# Patient Record
Sex: Female | Born: 1955 | Race: White | Hispanic: No | Smoking: Never smoker
Health system: Southern US, Community
[De-identification: ages and names within clinical notes are randomized; demographics above are authoritative.]

## PROBLEM LIST (undated history)

## (undated) DIAGNOSIS — M1990 Unspecified osteoarthritis, unspecified site: Secondary | ICD-10-CM

## (undated) DIAGNOSIS — Z8489 Family history of other specified conditions: Secondary | ICD-10-CM

## (undated) DIAGNOSIS — K3184 Gastroparesis: Secondary | ICD-10-CM

## (undated) DIAGNOSIS — E669 Obesity, unspecified: Secondary | ICD-10-CM

## (undated) DIAGNOSIS — K219 Gastro-esophageal reflux disease without esophagitis: Secondary | ICD-10-CM

## (undated) DIAGNOSIS — A159 Respiratory tuberculosis unspecified: Secondary | ICD-10-CM

## (undated) DIAGNOSIS — F32A Depression, unspecified: Secondary | ICD-10-CM

## (undated) DIAGNOSIS — K2970 Gastritis, unspecified, without bleeding: Secondary | ICD-10-CM

## (undated) DIAGNOSIS — I10 Essential (primary) hypertension: Secondary | ICD-10-CM

## (undated) DIAGNOSIS — K7460 Unspecified cirrhosis of liver: Secondary | ICD-10-CM

## (undated) DIAGNOSIS — D649 Anemia, unspecified: Secondary | ICD-10-CM

## (undated) DIAGNOSIS — I639 Cerebral infarction, unspecified: Secondary | ICD-10-CM

## (undated) DIAGNOSIS — K222 Esophageal obstruction: Secondary | ICD-10-CM

## (undated) DIAGNOSIS — F329 Major depressive disorder, single episode, unspecified: Secondary | ICD-10-CM

## (undated) DIAGNOSIS — G629 Polyneuropathy, unspecified: Secondary | ICD-10-CM

## (undated) DIAGNOSIS — F419 Anxiety disorder, unspecified: Secondary | ICD-10-CM

## (undated) DIAGNOSIS — K469 Unspecified abdominal hernia without obstruction or gangrene: Secondary | ICD-10-CM

## (undated) DIAGNOSIS — E785 Hyperlipidemia, unspecified: Secondary | ICD-10-CM

## (undated) DIAGNOSIS — N189 Chronic kidney disease, unspecified: Secondary | ICD-10-CM

## (undated) DIAGNOSIS — M722 Plantar fascial fibromatosis: Secondary | ICD-10-CM

## (undated) DIAGNOSIS — G459 Transient cerebral ischemic attack, unspecified: Secondary | ICD-10-CM

## (undated) HISTORY — DX: Obesity, unspecified: E66.9

## (undated) HISTORY — PX: HERNIA REPAIR: SHX51

## (undated) HISTORY — PX: FOOT SURGERY: SHX648

## (undated) HISTORY — PX: WISDOM TOOTH EXTRACTION: SHX21

## (undated) HISTORY — DX: Essential (primary) hypertension: I10

## (undated) HISTORY — DX: Hyperlipidemia, unspecified: E78.5

## (undated) HISTORY — DX: Anxiety disorder, unspecified: F41.9

## (undated) HISTORY — PX: UPPER GASTROINTESTINAL ENDOSCOPY: SHX188

## (undated) HISTORY — PX: COLONOSCOPY: SHX174

## (undated) HISTORY — DX: Major depressive disorder, single episode, unspecified: F32.9

## (undated) HISTORY — DX: Depression, unspecified: F32.A

## (undated) HISTORY — DX: Cerebral infarction, unspecified: I63.9

## (undated) HISTORY — DX: Unspecified osteoarthritis, unspecified site: M19.90

## (undated) HISTORY — PX: CHOLECYSTECTOMY: SHX55

## (undated) HISTORY — PX: BREAST SURGERY: SHX581

## (undated) HISTORY — PX: KNEE SURGERY: SHX244

## (undated) HISTORY — PX: JOINT REPLACEMENT: SHX530

## (undated) HISTORY — DX: Esophageal obstruction: K22.2

## (undated) HISTORY — DX: Respiratory tuberculosis unspecified: A15.9

## (undated) HISTORY — PX: CARDIAC CATHETERIZATION: SHX172

## (undated) HISTORY — DX: Gastro-esophageal reflux disease without esophagitis: K21.9

## (undated) HISTORY — DX: Gastritis, unspecified, without bleeding: K29.70

## (undated) HISTORY — DX: Unspecified abdominal hernia without obstruction or gangrene: K46.9

---

## 1998-06-21 ENCOUNTER — Ambulatory Visit (HOSPITAL_BASED_OUTPATIENT_CLINIC_OR_DEPARTMENT_OTHER): Admission: RE | Admit: 1998-06-21 | Discharge: 1998-06-21 | Payer: Self-pay | Admitting: Orthopaedic Surgery

## 1998-09-13 ENCOUNTER — Other Ambulatory Visit: Admission: RE | Admit: 1998-09-13 | Discharge: 1998-09-13 | Payer: Self-pay | Admitting: Obstetrics and Gynecology

## 1998-12-09 ENCOUNTER — Encounter: Admission: RE | Admit: 1998-12-09 | Discharge: 1999-03-09 | Payer: Self-pay | Admitting: Obstetrics and Gynecology

## 1998-12-26 ENCOUNTER — Ambulatory Visit (HOSPITAL_COMMUNITY): Admission: RE | Admit: 1998-12-26 | Discharge: 1998-12-26 | Payer: Self-pay | Admitting: Obstetrics and Gynecology

## 1998-12-26 ENCOUNTER — Encounter: Payer: Self-pay | Admitting: Obstetrics and Gynecology

## 1999-04-13 ENCOUNTER — Inpatient Hospital Stay (HOSPITAL_COMMUNITY): Admission: AD | Admit: 1999-04-13 | Discharge: 1999-04-15 | Payer: Self-pay | Admitting: Obstetrics and Gynecology

## 1999-06-08 ENCOUNTER — Emergency Department (HOSPITAL_COMMUNITY): Admission: EM | Admit: 1999-06-08 | Discharge: 1999-06-08 | Payer: Self-pay | Admitting: Emergency Medicine

## 1999-08-22 ENCOUNTER — Emergency Department (HOSPITAL_COMMUNITY): Admission: EM | Admit: 1999-08-22 | Discharge: 1999-08-22 | Payer: Self-pay | Admitting: Family Medicine

## 1999-08-22 ENCOUNTER — Encounter: Payer: Self-pay | Admitting: Family Medicine

## 1999-12-07 ENCOUNTER — Other Ambulatory Visit: Admission: RE | Admit: 1999-12-07 | Discharge: 1999-12-07 | Payer: Self-pay | Admitting: Obstetrics and Gynecology

## 1999-12-08 ENCOUNTER — Inpatient Hospital Stay (HOSPITAL_COMMUNITY): Admission: AD | Admit: 1999-12-08 | Discharge: 1999-12-08 | Payer: Self-pay | Admitting: Obstetrics & Gynecology

## 1999-12-26 ENCOUNTER — Encounter (INDEPENDENT_AMBULATORY_CARE_PROVIDER_SITE_OTHER): Payer: Self-pay

## 1999-12-26 ENCOUNTER — Ambulatory Visit (HOSPITAL_COMMUNITY): Admission: AD | Admit: 1999-12-26 | Discharge: 1999-12-26 | Payer: Self-pay | Admitting: Obstetrics and Gynecology

## 2000-04-22 ENCOUNTER — Emergency Department (HOSPITAL_COMMUNITY): Admission: EM | Admit: 2000-04-22 | Discharge: 2000-04-22 | Payer: Self-pay | Admitting: *Deleted

## 2000-06-14 ENCOUNTER — Emergency Department (HOSPITAL_COMMUNITY): Admission: EM | Admit: 2000-06-14 | Discharge: 2000-06-14 | Payer: Self-pay | Admitting: Emergency Medicine

## 2000-07-16 ENCOUNTER — Other Ambulatory Visit: Admission: RE | Admit: 2000-07-16 | Discharge: 2000-07-16 | Payer: Self-pay | Admitting: Obstetrics and Gynecology

## 2001-03-21 ENCOUNTER — Emergency Department (HOSPITAL_COMMUNITY): Admission: EM | Admit: 2001-03-21 | Discharge: 2001-03-21 | Payer: Self-pay | Admitting: Emergency Medicine

## 2001-03-22 ENCOUNTER — Emergency Department (HOSPITAL_COMMUNITY): Admission: EM | Admit: 2001-03-22 | Discharge: 2001-03-22 | Payer: Self-pay | Admitting: Emergency Medicine

## 2001-05-27 ENCOUNTER — Ambulatory Visit (HOSPITAL_BASED_OUTPATIENT_CLINIC_OR_DEPARTMENT_OTHER): Admission: RE | Admit: 2001-05-27 | Discharge: 2001-05-27 | Payer: Self-pay | Admitting: Oral Surgery

## 2001-10-18 ENCOUNTER — Emergency Department (HOSPITAL_COMMUNITY): Admission: EM | Admit: 2001-10-18 | Discharge: 2001-10-18 | Payer: Self-pay | Admitting: Emergency Medicine

## 2002-06-07 ENCOUNTER — Encounter: Payer: Self-pay | Admitting: Emergency Medicine

## 2002-06-07 ENCOUNTER — Emergency Department (HOSPITAL_COMMUNITY): Admission: EM | Admit: 2002-06-07 | Discharge: 2002-06-07 | Payer: Self-pay | Admitting: Emergency Medicine

## 2002-07-03 ENCOUNTER — Encounter: Payer: Self-pay | Admitting: Rheumatology

## 2002-07-03 ENCOUNTER — Encounter: Admission: RE | Admit: 2002-07-03 | Discharge: 2002-07-03 | Payer: Self-pay | Admitting: Rheumatology

## 2002-07-06 ENCOUNTER — Emergency Department (HOSPITAL_COMMUNITY): Admission: EM | Admit: 2002-07-06 | Discharge: 2002-07-07 | Payer: Self-pay | Admitting: *Deleted

## 2002-11-03 ENCOUNTER — Ambulatory Visit (HOSPITAL_COMMUNITY): Admission: RE | Admit: 2002-11-03 | Discharge: 2002-11-03 | Payer: Self-pay | Admitting: Family Medicine

## 2003-05-18 ENCOUNTER — Emergency Department (HOSPITAL_COMMUNITY): Admission: EM | Admit: 2003-05-18 | Discharge: 2003-05-18 | Payer: Self-pay | Admitting: *Deleted

## 2003-05-18 ENCOUNTER — Encounter: Payer: Self-pay | Admitting: Emergency Medicine

## 2003-06-13 ENCOUNTER — Emergency Department (HOSPITAL_COMMUNITY): Admission: AD | Admit: 2003-06-13 | Discharge: 2003-06-14 | Payer: Self-pay | Admitting: Emergency Medicine

## 2003-06-15 ENCOUNTER — Emergency Department (HOSPITAL_COMMUNITY): Admission: EM | Admit: 2003-06-15 | Discharge: 2003-06-15 | Payer: Self-pay | Admitting: Emergency Medicine

## 2003-06-24 ENCOUNTER — Emergency Department (HOSPITAL_COMMUNITY): Admission: EM | Admit: 2003-06-24 | Discharge: 2003-06-25 | Payer: Self-pay | Admitting: Emergency Medicine

## 2003-07-01 ENCOUNTER — Encounter: Payer: Self-pay | Admitting: Internal Medicine

## 2003-07-01 ENCOUNTER — Encounter: Admission: RE | Admit: 2003-07-01 | Discharge: 2003-07-01 | Payer: Self-pay | Admitting: Internal Medicine

## 2003-07-26 ENCOUNTER — Emergency Department (HOSPITAL_COMMUNITY): Admission: EM | Admit: 2003-07-26 | Discharge: 2003-07-26 | Payer: Self-pay | Admitting: Emergency Medicine

## 2003-08-08 ENCOUNTER — Emergency Department (HOSPITAL_COMMUNITY): Admission: EM | Admit: 2003-08-08 | Discharge: 2003-08-08 | Payer: Self-pay | Admitting: Emergency Medicine

## 2003-08-08 ENCOUNTER — Encounter: Payer: Self-pay | Admitting: Emergency Medicine

## 2003-08-17 ENCOUNTER — Ambulatory Visit (HOSPITAL_COMMUNITY): Admission: RE | Admit: 2003-08-17 | Discharge: 2003-08-17 | Payer: Self-pay | Admitting: Rheumatology

## 2003-08-17 ENCOUNTER — Encounter (INDEPENDENT_AMBULATORY_CARE_PROVIDER_SITE_OTHER): Payer: Self-pay | Admitting: Specialist

## 2003-09-22 ENCOUNTER — Emergency Department (HOSPITAL_COMMUNITY): Admission: EM | Admit: 2003-09-22 | Discharge: 2003-09-23 | Payer: Self-pay | Admitting: Emergency Medicine

## 2004-02-20 ENCOUNTER — Encounter: Admission: RE | Admit: 2004-02-20 | Discharge: 2004-02-20 | Payer: Self-pay | Admitting: Rheumatology

## 2004-03-15 ENCOUNTER — Emergency Department (HOSPITAL_COMMUNITY): Admission: EM | Admit: 2004-03-15 | Discharge: 2004-03-15 | Payer: Self-pay | Admitting: Emergency Medicine

## 2004-05-17 ENCOUNTER — Encounter: Admission: RE | Admit: 2004-05-17 | Discharge: 2004-05-17 | Payer: Self-pay | Admitting: Family Medicine

## 2004-05-26 ENCOUNTER — Other Ambulatory Visit: Admission: RE | Admit: 2004-05-26 | Discharge: 2004-05-26 | Payer: Self-pay | Admitting: Family Medicine

## 2004-06-06 ENCOUNTER — Inpatient Hospital Stay (HOSPITAL_COMMUNITY): Admission: RE | Admit: 2004-06-06 | Discharge: 2004-06-10 | Payer: Self-pay | Admitting: Orthopaedic Surgery

## 2004-06-28 ENCOUNTER — Ambulatory Visit: Payer: Self-pay | Admitting: Family Medicine

## 2004-07-10 ENCOUNTER — Ambulatory Visit: Payer: Self-pay | Admitting: Family Medicine

## 2004-07-13 ENCOUNTER — Encounter: Admission: RE | Admit: 2004-07-13 | Discharge: 2004-09-06 | Payer: Self-pay | Admitting: Orthopaedic Surgery

## 2004-08-02 ENCOUNTER — Ambulatory Visit: Payer: Self-pay | Admitting: Family Medicine

## 2004-09-15 ENCOUNTER — Ambulatory Visit: Payer: Self-pay | Admitting: Family Medicine

## 2004-09-19 ENCOUNTER — Emergency Department (HOSPITAL_COMMUNITY): Admission: EM | Admit: 2004-09-19 | Discharge: 2004-09-19 | Payer: Self-pay | Admitting: Emergency Medicine

## 2004-10-11 ENCOUNTER — Ambulatory Visit: Payer: Self-pay | Admitting: Family Medicine

## 2004-10-26 ENCOUNTER — Inpatient Hospital Stay (HOSPITAL_COMMUNITY): Admission: RE | Admit: 2004-10-26 | Discharge: 2004-10-29 | Payer: Self-pay | Admitting: Orthopaedic Surgery

## 2004-11-23 ENCOUNTER — Ambulatory Visit: Payer: Self-pay | Admitting: Family Medicine

## 2005-01-25 ENCOUNTER — Encounter: Admission: RE | Admit: 2005-01-25 | Discharge: 2005-04-25 | Payer: Self-pay | Admitting: Orthopaedic Surgery

## 2005-03-13 ENCOUNTER — Ambulatory Visit: Payer: Self-pay | Admitting: Family Medicine

## 2005-05-18 ENCOUNTER — Encounter: Admission: RE | Admit: 2005-05-18 | Discharge: 2005-05-18 | Payer: Self-pay | Admitting: Family Medicine

## 2005-10-25 ENCOUNTER — Emergency Department (HOSPITAL_COMMUNITY): Admission: EM | Admit: 2005-10-25 | Discharge: 2005-10-25 | Payer: Self-pay | Admitting: Emergency Medicine

## 2005-10-30 ENCOUNTER — Ambulatory Visit (HOSPITAL_BASED_OUTPATIENT_CLINIC_OR_DEPARTMENT_OTHER): Admission: RE | Admit: 2005-10-30 | Discharge: 2005-10-30 | Payer: Self-pay | Admitting: Orthopaedic Surgery

## 2005-12-19 ENCOUNTER — Ambulatory Visit (HOSPITAL_COMMUNITY): Admission: RE | Admit: 2005-12-19 | Discharge: 2005-12-19 | Payer: Self-pay | Admitting: Rheumatology

## 2005-12-19 ENCOUNTER — Encounter (INDEPENDENT_AMBULATORY_CARE_PROVIDER_SITE_OTHER): Payer: Self-pay | Admitting: Specialist

## 2005-12-21 ENCOUNTER — Encounter: Admission: RE | Admit: 2005-12-21 | Discharge: 2006-03-21 | Payer: Self-pay | Admitting: Family Medicine

## 2006-03-01 ENCOUNTER — Ambulatory Visit: Payer: Self-pay | Admitting: Gastroenterology

## 2006-03-25 ENCOUNTER — Ambulatory Visit (HOSPITAL_COMMUNITY): Admission: RE | Admit: 2006-03-25 | Discharge: 2006-03-25 | Payer: Self-pay | Admitting: Gastroenterology

## 2006-04-02 ENCOUNTER — Ambulatory Visit: Payer: Self-pay | Admitting: Gastroenterology

## 2006-05-21 ENCOUNTER — Encounter: Admission: RE | Admit: 2006-05-21 | Discharge: 2006-05-21 | Payer: Self-pay | Admitting: Family Medicine

## 2006-08-29 ENCOUNTER — Ambulatory Visit: Payer: Self-pay | Admitting: Gastroenterology

## 2006-09-10 ENCOUNTER — Ambulatory Visit (HOSPITAL_COMMUNITY): Admission: RE | Admit: 2006-09-10 | Discharge: 2006-09-10 | Payer: Self-pay | Admitting: Gastroenterology

## 2006-09-13 ENCOUNTER — Ambulatory Visit: Payer: Self-pay | Admitting: Gastroenterology

## 2006-10-04 ENCOUNTER — Emergency Department (HOSPITAL_COMMUNITY): Admission: EM | Admit: 2006-10-04 | Discharge: 2006-10-04 | Payer: Self-pay | Admitting: Emergency Medicine

## 2007-03-11 ENCOUNTER — Ambulatory Visit (HOSPITAL_BASED_OUTPATIENT_CLINIC_OR_DEPARTMENT_OTHER): Admission: RE | Admit: 2007-03-11 | Discharge: 2007-03-11 | Payer: Self-pay | Admitting: Orthopaedic Surgery

## 2007-03-25 DIAGNOSIS — F411 Generalized anxiety disorder: Secondary | ICD-10-CM | POA: Insufficient documentation

## 2007-03-25 DIAGNOSIS — F32A Depression, unspecified: Secondary | ICD-10-CM

## 2007-03-25 DIAGNOSIS — F329 Major depressive disorder, single episode, unspecified: Secondary | ICD-10-CM

## 2007-04-01 ENCOUNTER — Emergency Department (HOSPITAL_COMMUNITY): Admission: EM | Admit: 2007-04-01 | Discharge: 2007-04-01 | Payer: Self-pay | Admitting: *Deleted

## 2007-05-23 ENCOUNTER — Encounter: Admission: RE | Admit: 2007-05-23 | Discharge: 2007-05-23 | Payer: Self-pay | Admitting: Family Medicine

## 2007-06-23 ENCOUNTER — Ambulatory Visit: Payer: Self-pay | Admitting: Gastroenterology

## 2007-07-01 ENCOUNTER — Encounter: Payer: Self-pay | Admitting: Gastroenterology

## 2007-07-01 ENCOUNTER — Ambulatory Visit (HOSPITAL_COMMUNITY): Admission: RE | Admit: 2007-07-01 | Discharge: 2007-07-01 | Payer: Self-pay | Admitting: Gastroenterology

## 2007-07-03 ENCOUNTER — Ambulatory Visit: Payer: Self-pay | Admitting: Gastroenterology

## 2007-11-13 ENCOUNTER — Inpatient Hospital Stay (HOSPITAL_COMMUNITY): Admission: RE | Admit: 2007-11-13 | Discharge: 2007-11-16 | Payer: Self-pay | Admitting: Orthopaedic Surgery

## 2007-12-13 ENCOUNTER — Encounter (INDEPENDENT_AMBULATORY_CARE_PROVIDER_SITE_OTHER): Payer: Self-pay | Admitting: Orthopedic Surgery

## 2007-12-13 ENCOUNTER — Ambulatory Visit: Payer: Self-pay | Admitting: Surgery

## 2007-12-13 ENCOUNTER — Ambulatory Visit (HOSPITAL_COMMUNITY): Admission: RE | Admit: 2007-12-13 | Discharge: 2007-12-13 | Payer: Self-pay | Admitting: Orthopedic Surgery

## 2008-01-09 DIAGNOSIS — E669 Obesity, unspecified: Secondary | ICD-10-CM | POA: Insufficient documentation

## 2008-01-09 DIAGNOSIS — K439 Ventral hernia without obstruction or gangrene: Secondary | ICD-10-CM | POA: Insufficient documentation

## 2008-01-09 DIAGNOSIS — K219 Gastro-esophageal reflux disease without esophagitis: Secondary | ICD-10-CM | POA: Insufficient documentation

## 2008-01-09 DIAGNOSIS — E785 Hyperlipidemia, unspecified: Secondary | ICD-10-CM | POA: Insufficient documentation

## 2008-01-09 DIAGNOSIS — J45909 Unspecified asthma, uncomplicated: Secondary | ICD-10-CM | POA: Insufficient documentation

## 2008-01-09 DIAGNOSIS — M129 Arthropathy, unspecified: Secondary | ICD-10-CM | POA: Insufficient documentation

## 2008-03-08 ENCOUNTER — Emergency Department (HOSPITAL_COMMUNITY): Admission: EM | Admit: 2008-03-08 | Discharge: 2008-03-08 | Payer: Self-pay | Admitting: Emergency Medicine

## 2008-06-04 ENCOUNTER — Encounter: Admission: RE | Admit: 2008-06-04 | Discharge: 2008-06-04 | Payer: Self-pay | Admitting: Family Medicine

## 2008-06-10 ENCOUNTER — Ambulatory Visit: Payer: Self-pay | Admitting: Gastroenterology

## 2008-06-10 DIAGNOSIS — R1032 Left lower quadrant pain: Secondary | ICD-10-CM | POA: Insufficient documentation

## 2008-07-06 ENCOUNTER — Ambulatory Visit: Payer: Self-pay | Admitting: Gastroenterology

## 2008-12-05 ENCOUNTER — Encounter (INDEPENDENT_AMBULATORY_CARE_PROVIDER_SITE_OTHER): Payer: Self-pay | Admitting: Emergency Medicine

## 2008-12-05 ENCOUNTER — Emergency Department (HOSPITAL_COMMUNITY): Admission: EM | Admit: 2008-12-05 | Discharge: 2008-12-05 | Payer: Self-pay | Admitting: Emergency Medicine

## 2008-12-05 ENCOUNTER — Ambulatory Visit: Payer: Self-pay | Admitting: Vascular Surgery

## 2009-07-12 ENCOUNTER — Encounter: Admission: RE | Admit: 2009-07-12 | Discharge: 2009-07-12 | Payer: Self-pay | Admitting: Family Medicine

## 2009-08-25 ENCOUNTER — Emergency Department (HOSPITAL_COMMUNITY): Admission: EM | Admit: 2009-08-25 | Discharge: 2009-08-25 | Payer: Self-pay | Admitting: Emergency Medicine

## 2009-10-21 ENCOUNTER — Ambulatory Visit: Payer: Self-pay | Admitting: Gastroenterology

## 2009-10-21 DIAGNOSIS — K222 Esophageal obstruction: Secondary | ICD-10-CM

## 2009-10-24 ENCOUNTER — Ambulatory Visit: Payer: Self-pay | Admitting: Gastroenterology

## 2010-04-28 ENCOUNTER — Ambulatory Visit: Payer: Self-pay | Admitting: Hematology and Oncology

## 2010-04-29 ENCOUNTER — Emergency Department (HOSPITAL_COMMUNITY): Admission: EM | Admit: 2010-04-29 | Discharge: 2010-04-29 | Payer: Self-pay | Admitting: Emergency Medicine

## 2010-05-05 ENCOUNTER — Encounter: Payer: Self-pay | Admitting: Gastroenterology

## 2010-05-05 LAB — CBC WITH DIFFERENTIAL/PLATELET
EOS%: 2.2 % (ref 0.0–7.0)
HCT: 31.8 % — ABNORMAL LOW (ref 34.8–46.6)
HGB: 10.1 g/dL — ABNORMAL LOW (ref 11.6–15.9)
MCH: 29.2 pg (ref 25.1–34.0)
NEUT%: 29.3 % — ABNORMAL LOW (ref 38.4–76.8)
Platelets: 73 10*3/uL — ABNORMAL LOW (ref 145–400)
RBC: 3.46 10*6/uL — ABNORMAL LOW (ref 3.70–5.45)
WBC: 1.8 10*3/uL — ABNORMAL LOW (ref 3.9–10.3)
nRBC: 0 % (ref 0–0)

## 2010-05-09 LAB — PROTEIN ELECTROPHORESIS, SERUM, WITH REFLEX
Albumin ELP: 49.6 % — ABNORMAL LOW (ref 55.8–66.1)
Alpha-1-Globulin: 5.5 % — ABNORMAL HIGH (ref 2.9–4.9)
Alpha-2-Globulin: 13 % — ABNORMAL HIGH (ref 7.1–11.8)
Total Protein, Serum Electrophoresis: 6.9 g/dL (ref 6.0–8.3)

## 2010-05-09 LAB — COMPREHENSIVE METABOLIC PANEL
ALT: 55 U/L — ABNORMAL HIGH (ref 0–35)
AST: 39 U/L — ABNORMAL HIGH (ref 0–37)
Albumin: 3.6 g/dL (ref 3.5–5.2)
Alkaline Phosphatase: 66 U/L (ref 39–117)
BUN: 11 mg/dL (ref 6–23)
Calcium: 8.8 mg/dL (ref 8.4–10.5)
Chloride: 107 mEq/L (ref 96–112)
Glucose, Bld: 147 mg/dL — ABNORMAL HIGH (ref 70–99)
Sodium: 142 mEq/L (ref 135–145)
Total Bilirubin: 0.5 mg/dL (ref 0.3–1.2)
Total Protein: 6.9 g/dL (ref 6.0–8.3)

## 2010-05-09 LAB — IRON AND TIBC
%SAT: 12 % — ABNORMAL LOW (ref 20–55)
Iron: 34 ug/dL — ABNORMAL LOW (ref 42–145)
TIBC: 287 ug/dL (ref 250–470)

## 2010-05-09 LAB — LACTATE DEHYDROGENASE: LDH: 150 U/L (ref 94–250)

## 2010-05-09 LAB — FERRITIN: Ferritin: 110 ng/mL (ref 10–291)

## 2010-05-16 ENCOUNTER — Encounter: Payer: Self-pay | Admitting: Gastroenterology

## 2010-05-16 LAB — CBC WITH DIFFERENTIAL/PLATELET
Eosinophils Absolute: 0.2 10*3/uL (ref 0.0–0.5)
HCT: 34.5 % — ABNORMAL LOW (ref 34.8–46.6)
HGB: 11.4 g/dL — ABNORMAL LOW (ref 11.6–15.9)
LYMPH%: 35.9 % (ref 14.0–49.7)
MONO#: 0.4 10*3/uL (ref 0.1–0.9)
Platelets: 367 10*3/uL (ref 145–400)
RDW: 20.1 % — ABNORMAL HIGH (ref 11.2–14.5)
WBC: 6.1 10*3/uL (ref 3.9–10.3)

## 2010-06-14 ENCOUNTER — Emergency Department (HOSPITAL_COMMUNITY): Admission: EM | Admit: 2010-06-14 | Discharge: 2010-06-14 | Payer: Self-pay | Admitting: Emergency Medicine

## 2010-07-15 ENCOUNTER — Emergency Department (HOSPITAL_COMMUNITY): Admission: EM | Admit: 2010-07-15 | Discharge: 2010-07-15 | Payer: Self-pay | Admitting: Emergency Medicine

## 2010-08-11 ENCOUNTER — Encounter: Admission: RE | Admit: 2010-08-11 | Discharge: 2010-08-11 | Payer: Self-pay | Admitting: Family Medicine

## 2010-08-29 ENCOUNTER — Inpatient Hospital Stay (HOSPITAL_COMMUNITY): Admission: EM | Admit: 2010-08-29 | Discharge: 2010-08-31 | Payer: Self-pay | Admitting: Emergency Medicine

## 2010-08-29 ENCOUNTER — Ambulatory Visit: Payer: Self-pay | Admitting: Cardiovascular Disease

## 2010-08-29 ENCOUNTER — Encounter (INDEPENDENT_AMBULATORY_CARE_PROVIDER_SITE_OTHER): Payer: Self-pay | Admitting: Internal Medicine

## 2010-08-30 ENCOUNTER — Other Ambulatory Visit: Payer: Self-pay | Admitting: Cardiology

## 2010-08-30 HISTORY — PX: CARDIAC CATHETERIZATION: SHX172

## 2010-11-16 NOTE — Letter (Signed)
Summary: [place]   Imported By: Phillis Knack 05/29/2010 12:21:59  _____________________________________________________________________  External Attachment:    Type:   Image     Comment:   External Document

## 2010-11-16 NOTE — Letter (Signed)
Summary: EGD Instructions  [facility]  [place], [place] [address]   Phone: [phone]  Fax: [phone]       Carrie [name]    Aug 03, 1956    MRN: [id number]       Procedure Day /Date:MONDAY 10/24/2009     Arrival Time: 2:30PM     Procedure Time:3:30PM     Location of Procedure:                    [facility] (4th Floor)    PREPARATION FOR ENDOSCOPY   On1/10/2011THE DAY OF THE PROCEDURE:  1.   No solid foods, milk or milk products are allowed after midnight the night before your procedure.  2.   Do not drink anything colored red or purple.  Avoid juices with pulp.  No orange juice.  3.  You may drink clear liquids until1:30PM, which is 2 hours before your procedure.                                                                                                CLEAR LIQUIDS INCLUDE: Water Jello Ice Popsicles Tea (sugar ok, no milk/cream) Powdered fruit flavored drinks Coffee (sugar ok, no milk/cream) Gatorade Juice: apple, white grape, white cranberry  Lemonade Clear bullion, consomm, broth Carbonated beverages (any kind) Strained chicken noodle soup Hard Candy   MEDICATION INSTRUCTIONS  Unless otherwise instructed, you should take regular prescription medications with a small sip of water as early as possible the morning of your procedure.  Diabetic patients - see separate instructions.            OTHER INSTRUCTIONS  You will need a responsible adult at least 54 years of age to accompany you and drive you home.   This person must remain in the waiting room during your procedure.  Wear loose fitting clothing that is easily removed.  Leave jewelry and other valuables at home.  However, you may wish to bring a book to read or an iPod/MP3 player to listen to music as you wait for your procedure to start.  Remove all body piercing jewelry and leave at home.  Total time from sign-in until discharge is  approximately 2-3 hours.  You should go home directly after your procedure and rest.  You can resume normal activities the day after your procedure.  The day of your procedure you should not:   Drive   Make legal decisions   Operate machinery   Drink alcohol   Return to work  You will receive specific instructions about eating, activities and medications before you leave.    The above instructions have been reviewed and explained to me by   _______________________    I fully understand and can verbalize these instructions _____________________________ Date _________

## 2010-11-16 NOTE — Procedures (Signed)
Summary: Upper Endoscopy  Patient: Carrie [name] Note: All result statuses are Final unless otherwise noted.  Tests: (1) Upper Endoscopy (EGD)   EGD Upper Endoscopy       [facility]     [place], [place]  [id number]           ENDOSCOPY PROCEDURE REPORT           PATIENT:  Carrie [name], Carrie [name]  MR#:  [id number]     BIRTHDATE:  08-12-1956, 63 yrs. old  GENDER:  female           ENDOSCOPIST:  Sandy Salaam. Deatra Ina, MD     Referred by:           PROCEDURE DATE:  10/24/2009     PROCEDURE:  EGD, diagnostic, Maloney Dilation of Esophagus     ASA CLASS:  Class II     INDICATIONS:  dysphagia           MEDICATIONS:   Fentanyl 75 mcg IV, Versed 9 mg IV, glycopyrrolate     (Robinal) 0.2 mg IV, 0.6cc simethancone 0.6 cc PO     TOPICAL ANESTHETIC:  Exactacain Spray           DESCRIPTION OF PROCEDURE:   After the risks benefits and     alternatives of the procedure were thoroughly explained, informed     consent was obtained.  [facility] GIF-H180 [id number] endoscope was     introduced through the mouth and advanced to the third portion of     the duodenum, without limitations.  The instrument was slowly     withdrawn as the mucosa was fully examined.     <<PROCEDUREIMAGES>>           A stricture was found at the gastroesophageal junction. Early     stricture Dilation with maloney dilator 56m Mild resistance; no     heme  Mild gastritis was found in the antrum (see image1). Patches     of erythema  Otherwise the examination was normal.    Retroflexed     views revealed no abnormalities.    The scope was then withdrawn     from the patient and the procedure completed.           COMPLICATIONS:  None           ENDOSCOPIC IMPRESSION:     1) Stricture at the gastroesophageal junction     2) Mild gastritis in the antrum     3) Otherwise normal examination     4) continue nexium     RECOMMENDATIONS:     1) dilatations PRN           REPEAT EXAM:  No        ______________________________     RSandy Salaam KDeatra Ina MD           CC:  WEligah East MD           n.     eLorrin MaisSandy Salaam Kaplan at 10/24/2009 04:09 PM           MBradly Chris [id number] Note: An exclamation mark (!) indicates a result that was not dispersed into the flowsheet. Document Creation Date: 10/24/2009 4:08 PM _______________________________________________________________________  (1) Order result status: Final Collection or observation date-time: 10/24/2009 16:05 Requested date-time:  Receipt date-time:  Reported date-time:  Referring Physician:   Ordering Physician: RErskine [phone] Specimen  Source:  Source: Tawanna Cooler Order Number: [id number] Lab site:

## 2010-11-16 NOTE — Assessment & Plan Note (Signed)
Summary: FOLLOW UP/em   History of Present Illness Visit Type: Follow-up Visit Primary GI MD: Erskine Emery MD [facility] Primary Provider: Dr. Eligah East Chief Complaint: Solid food dysphagia increasingly getting worse. Pt wants to schedule a EGD with dilitation. History of Present Illness:   Ms Blas has returned for evaluation of dysphagia.  She has a history of an esophageal stricture for which she underwent dilatation in September, 2008.  She is complaining of recurrent dysphagia to solids with odynophagia.  She denies pyrosis.  She takes Nexium daily.   GI Review of Systems    Reports dysphagia with solids.      Denies abdominal pain, acid reflux, belching, bloating, chest pain, dysphagia with liquids, heartburn, loss of appetite, nausea, vomiting, vomiting blood, weight loss, and  weight gain.        Denies anal fissure, black tarry stools, change in bowel habit, constipation, diarrhea, diverticulosis, fecal incontinence, heme positive stool, hemorrhoids, irritable bowel syndrome, jaundice, light color stool, liver problems, rectal bleeding, and  rectal pain.    Current Medications (verified): 1)  Folic Acid 1 Mg Tabs (Folic Acid) .Marland Kitchen.. 1 Tablet By Mouth Once Daily 2)  Actonel 35 Mg Tabs (Risedronate Sodium) .Marland Kitchen.. 1 Tablet By Mouth Weekly 3)  Nexium 40 Mg Cpdr (Esomeprazole Magnesium) 4)  Enablex 7.5 Mg Xr24h-Tab (Darifenacin Hydrobromide) .Marland Kitchen.. 1 Tablet By Mouth Once Daily 5)  Cymbalta 30 Mg Cpep (Duloxetine Hcl) .Marland Kitchen.. 1 Tablet By Mouth Once Daily 6)  Janumet 50-1000 Mg Tabs (Sitagliptin-Metformin Hcl) .Marland Kitchen.. 1 Tablet By Mouth Once Daily 7)  Glimepiride 4 Mg Tabs (Glimepiride) .Marland Kitchen.. 1 Tablet By Mouth Once Daily 8)  Metoprolol Tartrate 50 Mg Tabs (Metoprolol Tartrate) .Marland Kitchen.. 1 Tablet By Mouth Once Daily 9)  Tricor 145 Mg Tabs (Fenofibrate) .Marland Kitchen.. 1 Tablet By Mouth Once Daily 10)  Cozaar 50 Mg Tabs (Losartan Potassium) .Marland Kitchen.. 1 Tablet By Mouth Once Daily 11)  Simvastatin 40 Mg Tabs  (Simvastatin) .Marland Kitchen.. 1 Tablet By Mouth Once Daily 12)  Desonide 0.05 % Lotn (Desonide) .... 2-3 Times Per Day 13)  Proair Hfa 108 (90 Base) Mcg/act Aers (Albuterol Sulfate) .... 2 Puffs 4 Times Per Day 14)  Luxiq 0.12 % Foam (Betamethasone Valerate) .... Three Times A Day 15)  Vitamin B-6 50 Mg Tabs (Pyridoxine Hcl) .Marland Kitchen.. 1 Tablet By Mouth Once Daily 16)  Hyomax-Sl 0.125 Mg Subl (Hyoscyamine Sulfate) .... 2 Tabs Sublingual Q.4 H. P.r.n. 17)  Niaspan 500 Mg Cr-Tabs (Niacin (Antihyperlipidemic)) .... One Tablet By Mouth Once Daily 18)  Aspirin 325 Mg  Tabs (Aspirin) .... One Tablet By Mouth Once Daily  Allergies (verified): No Known Drug Allergies  Past History:  Past Medical History: Reviewed history from 01/09/2008 and no changes required. Current Problems:  HERNIA, VENTRAL (ICD-553.20) HYPERLIPIDEMIA (ICD-272.4) ASTHMA (ICD-493.90) DEPRESSION (ICD-311) ARTHRITIS (ICD-716.90) GERD (ICD-530.81) DIABETES MELLITUS, TYPE I (ICD-250.01) OBESITY (ICD-278.00) ESOPHAGEAL STRICTURE (ICD-530.3)  Past Surgical History: Reviewed history from 01/09/2008 and no changes required. breast surgery Lt. knee replacement Rt. knee surgery  Family History: Reviewed history from 06/10/2008 and no changes required. Family History of Diabetes: father,sister,brother and aunts /uncles No FH of Colon Cancer:  Social History: Reviewed history from 06/10/2008 and no changes required. Patient has never smoked.  Alcohol Use - no Daily Caffeine Use  Review of Systems       The patient complains of arthritis/joint pain.  The patient denies allergy/sinus, anemia, anxiety-new, back pain, blood in urine, breast changes/lumps, change in vision, confusion, cough, coughing up blood, depression-new, fainting,  fatigue, fever, headaches-new, hearing problems, heart murmur, heart rhythm changes, itching, menstrual pain, muscle pains/cramps, night sweats, nosebleeds, pregnancy symptoms, shortness of breath, skin rash,  sleeping problems, sore throat, swelling of feet/legs, swollen lymph glands, thirst - excessive , urination - excessive , urination changes/pain, urine leakage, vision changes, and voice change.    Vital Signs:  Patient profile:   55 year old female Height:      60 inches Weight:      209.38 pounds BMI:     41.04 Pulse rate:   82 / minute Pulse rhythm:   regular BP sitting:   116 / 72  (left arm) Cuff size:   regular  Vitals Entered By: Marlon Pel CMA Deborra Medina) (October 21, 2009 1:58 PM)  Physical Exam  Additional Exam:  She is an obese female  skin: anicteric HEENT: normocephalic; PEERLA; no nasal or pharyngeal abnormalities neck: supple nodes: no cervical lymphadenopathy chest: clear to ausculatation and percussion heart: no murmurs, gallops, or rubs abd: soft, nontender; BS normoactive; no abdominal masses, tenderness, organomegaly rectal: deferred ext: no cynanosis, clubbing, edema skeletal: no deformities neuro: oriented x 3; no focal abnormalities    Impression & Recommendations:  Problem # 1:  STRICTURE AND STENOSIS OF ESOPHAGUS (ICD-530.3) Assessment Deteriorated  Plan repeat endoscopy with dilatation  Risks, alternatives, and complications of the procedure, including bleeding, perforation, and possible need for surgery, were explained to the patient.  Patient's questions were answered.  Orders: EGD (EGD)  Patient Instructions: 1)  Conscious Sedation brochure given.  2)  Upper Endoscopy with Dilatation brochure given.  3)  cc Dr. Seth Bake

## 2010-11-16 NOTE — Letter (Signed)
Summary: [place]   Imported By: Phillis Knack 06/06/2010 13:23:22  _____________________________________________________________________  External Attachment:    Type:   Image     Comment:   External Document

## 2010-11-16 NOTE — Letter (Signed)
Summary: Diabetic Instructions  [facility]  [place], [place] [address]   Phone: [phone]  Fax: [phone]    [facility] December 29, 1955 MRN: [id number]   X   ORAL DIABETIC MEDICATION INSTRUCTIONS  The day before your procedure:   Take your diabetic pill as you do normally  The day of your procedure:   Do not take your diabetic pill    We will check your blood sugar levels during the admission process and again in Recovery before discharging you home  ________________________________________________________________________  _  _   INSULIN (LONG ACTING) MEDICATION INSTRUCTIONS (Lantus, NPH, 70/30, Humulin, Novolin-N)   The day before your procedure:   Take  your regular evening dose    The day of your procedure:   Do not take your morning dose    _  _   INSULIN (SHORT ACTING) MEDICATION INSTRUCTIONS (Regular, Humulog, Novolog)   The day before your procedure:   Do not take your evening dose   The day of your procedure:   Do not take your morning dose   _  _   INSULIN PUMP MEDICATION INSTRUCTIONS  We will contact the physician managing your diabetic care for written dosage instructions for the day before your procedure and the day of your procedure.  Once we have received the instructions, we will contact you.

## 2010-11-22 ENCOUNTER — Encounter (HOSPITAL_BASED_OUTPATIENT_CLINIC_OR_DEPARTMENT_OTHER): Payer: Medicare PPO | Admitting: Hematology and Oncology

## 2010-11-22 ENCOUNTER — Encounter: Payer: Self-pay | Admitting: Gastroenterology

## 2010-11-22 ENCOUNTER — Other Ambulatory Visit: Payer: Self-pay | Admitting: Hematology and Oncology

## 2010-11-22 DIAGNOSIS — D759 Disease of blood and blood-forming organs, unspecified: Secondary | ICD-10-CM

## 2010-11-22 DIAGNOSIS — L4050 Arthropathic psoriasis, unspecified: Secondary | ICD-10-CM

## 2010-11-22 DIAGNOSIS — D473 Essential (hemorrhagic) thrombocythemia: Secondary | ICD-10-CM

## 2010-11-22 DIAGNOSIS — D509 Iron deficiency anemia, unspecified: Secondary | ICD-10-CM

## 2010-11-22 LAB — CBC WITH DIFFERENTIAL/PLATELET
Basophils Absolute: 0.1 10*3/uL (ref 0.0–0.1)
EOS%: 2.2 % (ref 0.0–7.0)
HCT: 39 % (ref 34.8–46.6)
LYMPH%: 45.1 % (ref 14.0–49.7)
MCH: 27.8 pg (ref 25.1–34.0)
MCV: 83.7 fL (ref 79.5–101.0)
MONO#: 0.8 10*3/uL (ref 0.1–0.9)
NEUT#: 2.8 10*3/uL (ref 1.5–6.5)
RBC: 4.66 10*6/uL (ref 3.70–5.45)
RDW: 16.5 % — ABNORMAL HIGH (ref 11.2–14.5)
WBC: 6.9 10*3/uL (ref 3.9–10.3)

## 2010-11-22 LAB — COMPREHENSIVE METABOLIC PANEL
ALT: 12 U/L (ref 0–35)
AST: 14 U/L (ref 0–37)
Albumin: 3.9 g/dL (ref 3.5–5.2)
Creatinine, Ser: 0.94 mg/dL (ref 0.40–1.20)
Glucose, Bld: 301 mg/dL — ABNORMAL HIGH (ref 70–99)
Potassium: 4.3 mEq/L (ref 3.5–5.3)
Sodium: 136 mEq/L (ref 135–145)

## 2010-12-06 ENCOUNTER — Ambulatory Visit: Payer: Self-pay | Admitting: Gastroenterology

## 2010-12-26 LAB — URINE CULTURE: Colony Count: 65000

## 2010-12-26 LAB — CARDIAC PANEL(CRET KIN+CKTOT+MB+TROPI)
Relative Index: 1.3 (ref 0.0–2.5)
Troponin I: 0.01 ng/mL (ref 0.00–0.06)

## 2010-12-26 LAB — PROTIME-INR
INR: 1.04 (ref 0.00–1.49)
INR: 1.09 (ref 0.00–1.49)

## 2010-12-26 LAB — GLUCOSE, CAPILLARY
Glucose-Capillary: 69 mg/dL — ABNORMAL LOW (ref 70–99)
Glucose-Capillary: 123 mg/dL — ABNORMAL HIGH (ref 70–99)
Glucose-Capillary: 174 mg/dL — ABNORMAL HIGH (ref 70–99)
Glucose-Capillary: 183 mg/dL — ABNORMAL HIGH (ref 70–99)
Glucose-Capillary: 187 mg/dL — ABNORMAL HIGH (ref 70–99)
Glucose-Capillary: 216 mg/dL — ABNORMAL HIGH (ref 70–99)
Glucose-Capillary: 398 mg/dL — ABNORMAL HIGH (ref 70–99)

## 2010-12-26 LAB — CK TOTAL AND CKMB (NOT AT ARMC)
Relative Index: INVALID (ref 0.0–2.5)
Total CK: 88 U/L (ref 7–177)

## 2010-12-26 LAB — URINE MICROSCOPIC-ADD ON

## 2010-12-26 LAB — LIPID PANEL
HDL: 28 mg/dL — ABNORMAL LOW (ref >39)
Total CHOL/HDL Ratio: 5.4 RATIO
Triglycerides: 224 mg/dL — ABNORMAL HIGH (ref <150)
VLDL: 45 mg/dL — ABNORMAL HIGH (ref 0–40)

## 2010-12-26 LAB — CBC
MCH: 28.4 pg (ref 26.0–34.0)
MCHC: 33.5 g/dL (ref 30.0–36.0)
Platelets: 151 10*3/uL (ref 150–400)
Platelets: 172 10*3/uL (ref 150–400)
Platelets: 217 10*3/uL (ref 150–400)
RBC: 3.89 MIL/uL (ref 3.87–5.11)
RDW: 17.5 % — ABNORMAL HIGH (ref 11.5–15.5)
WBC: 7.7 10*3/uL (ref 4.0–10.5)
WBC: 8.3 10*3/uL (ref 4.0–10.5)

## 2010-12-26 LAB — URINALYSIS, ROUTINE W REFLEX MICROSCOPIC
Bilirubin Urine: NEGATIVE
Glucose, UA: >1000 mg/dL — AB
Ketones, ur: NEGATIVE mg/dL
Nitrite: NEGATIVE
Protein, ur: NEGATIVE mg/dL
Urobilinogen, UA: 0.2 mg/dL (ref 0.0–1.0)

## 2010-12-26 LAB — DIFFERENTIAL
Basophils Absolute: 0 10*3/uL (ref 0.0–0.1)
Eosinophils Absolute: 0 10*3/uL (ref 0.0–0.7)
Eosinophils Relative: 0 % (ref 0–5)
Monocytes Absolute: 0.3 10*3/uL (ref 0.1–1.0)
Neutro Abs: 5.9 10*3/uL (ref 1.7–7.7)
Neutrophils Relative %: 71 % (ref 43–77)

## 2010-12-26 LAB — BASIC METABOLIC PANEL
BUN: 19 mg/dL (ref 6–23)
CO2: 26 mEq/L (ref 19–32)
Calcium: 8.5 mg/dL (ref 8.4–10.5)
Calcium: 8.8 mg/dL (ref 8.4–10.5)
Creatinine, Ser: 1.13 mg/dL (ref 0.4–1.2)
Creatinine, Ser: 1.16 mg/dL (ref 0.4–1.2)
GFR calc Af Amer: 59 mL/min — ABNORMAL LOW (ref >60)
GFR calc non Af Amer: 50 mL/min — ABNORMAL LOW (ref >60)
Potassium: 4.5 mEq/L (ref 3.5–5.1)

## 2010-12-26 LAB — HEMOGLOBIN A1C: Hgb A1c MFr Bld: 9.1 % — ABNORMAL HIGH (ref <5.7)

## 2010-12-26 LAB — D-DIMER, QUANTITATIVE: D-Dimer, Quant: 1.73 ug/mL-FEU — ABNORMAL HIGH (ref 0.00–0.48)

## 2010-12-26 LAB — APTT: aPTT: 30 seconds (ref 24–37)

## 2010-12-26 NOTE — Letter (Signed)
Summary: [place] [facility]   Imported By: Phillis Knack 12/20/2010 14:30:50  _____________________________________________________________________  External Attachment:    Type:   Image     Comment:   External Document

## 2010-12-27 LAB — DIFFERENTIAL
Eosinophils Relative: 0 % (ref 0–5)
Lymphocytes Relative: 11 % — ABNORMAL LOW (ref 12–46)
Lymphs Abs: 1.4 10*3/uL (ref 0.7–4.0)
Monocytes Absolute: 1.5 10*3/uL — ABNORMAL HIGH (ref 0.1–1.0)
Monocytes Relative: 12 % (ref 3–12)
Neutro Abs: 9.9 10*3/uL — ABNORMAL HIGH (ref 1.7–7.7)

## 2010-12-27 LAB — URINE MICROSCOPIC-ADD ON

## 2010-12-27 LAB — COMPREHENSIVE METABOLIC PANEL
ALT: 12 U/L (ref 0–35)
AST: 18 U/L (ref 0–37)
Albumin: 2.8 g/dL — ABNORMAL LOW (ref 3.5–5.2)
Alkaline Phosphatase: 88 U/L (ref 39–117)
Calcium: 8.5 mg/dL (ref 8.4–10.5)
GFR calc Af Amer: 45 mL/min — ABNORMAL LOW (ref >60)
Glucose, Bld: 295 mg/dL — ABNORMAL HIGH (ref 70–99)
Potassium: 3.9 mEq/L (ref 3.5–5.1)
Sodium: 131 mEq/L — ABNORMAL LOW (ref 135–145)
Total Protein: 6.9 g/dL (ref 6.0–8.3)

## 2010-12-27 LAB — CBC
HCT: 35.5 % — ABNORMAL LOW (ref 36.0–46.0)
MCHC: 33.4 g/dL (ref 30.0–36.0)
RDW: 16.1 % — ABNORMAL HIGH (ref 11.5–15.5)
WBC: 13 10*3/uL — ABNORMAL HIGH (ref 4.0–10.5)

## 2010-12-27 LAB — URINE CULTURE
Colony Count: >=100000
Culture  Setup Time: 201110011741

## 2010-12-27 LAB — URINALYSIS, ROUTINE W REFLEX MICROSCOPIC
Bilirubin Urine: NEGATIVE
Glucose, UA: >1000 mg/dL — AB
Ketones, ur: NEGATIVE mg/dL
Specific Gravity, Urine: 1.025 (ref 1.005–1.030)
pH: 6 (ref 5.0–8.0)

## 2010-12-30 LAB — GLUCOSE, CAPILLARY: Glucose-Capillary: 177 mg/dL — ABNORMAL HIGH (ref 70–99)

## 2011-01-17 LAB — BASIC METABOLIC PANEL
BUN: 16 mg/dL (ref 6–23)
Chloride: 98 mEq/L (ref 96–112)
Creatinine, Ser: 0.94 mg/dL (ref 0.4–1.2)
GFR calc non Af Amer: >60 mL/min (ref >60)
Glucose, Bld: 296 mg/dL — ABNORMAL HIGH (ref 70–99)
Potassium: 3.9 mEq/L (ref 3.5–5.1)

## 2011-01-17 LAB — CBC
HCT: 38.7 % (ref 36.0–46.0)
MCV: 91 fL (ref 78.0–100.0)
Platelets: 188 10*3/uL (ref 150–400)
RDW: 15.8 % — ABNORMAL HIGH (ref 11.5–15.5)
WBC: 7.1 10*3/uL (ref 4.0–10.5)

## 2011-01-17 LAB — URINALYSIS, ROUTINE W REFLEX MICROSCOPIC
Bilirubin Urine: NEGATIVE
Hgb urine dipstick: NEGATIVE
Specific Gravity, Urine: 1.033 — ABNORMAL HIGH (ref 1.005–1.030)
Urobilinogen, UA: 0.2 mg/dL (ref 0.0–1.0)
pH: 6 (ref 5.0–8.0)

## 2011-01-17 LAB — URINE MICROSCOPIC-ADD ON

## 2011-01-17 LAB — KETONES, QUALITATIVE: Acetone, Bld: NEGATIVE

## 2011-02-24 ENCOUNTER — Observation Stay (HOSPITAL_COMMUNITY)
Admission: EM | Admit: 2011-02-24 | Discharge: 2011-02-26 | Disposition: A | Discharge status: Home / Self Care | Payer: Medicare PPO | Attending: Internal Medicine | Admitting: Internal Medicine

## 2011-02-24 DIAGNOSIS — L408 Other psoriasis: Secondary | ICD-10-CM | POA: Insufficient documentation

## 2011-02-24 DIAGNOSIS — R209 Unspecified disturbances of skin sensation: Secondary | ICD-10-CM | POA: Insufficient documentation

## 2011-02-24 DIAGNOSIS — Z79899 Other long term (current) drug therapy: Secondary | ICD-10-CM | POA: Insufficient documentation

## 2011-02-24 DIAGNOSIS — Z96659 Presence of unspecified artificial knee joint: Secondary | ICD-10-CM | POA: Insufficient documentation

## 2011-02-24 DIAGNOSIS — E119 Type 2 diabetes mellitus without complications: Secondary | ICD-10-CM | POA: Insufficient documentation

## 2011-02-24 DIAGNOSIS — G459 Transient cerebral ischemic attack, unspecified: Principal | ICD-10-CM | POA: Insufficient documentation

## 2011-02-24 DIAGNOSIS — M069 Rheumatoid arthritis, unspecified: Secondary | ICD-10-CM | POA: Insufficient documentation

## 2011-02-24 DIAGNOSIS — I10 Essential (primary) hypertension: Secondary | ICD-10-CM | POA: Insufficient documentation

## 2011-02-24 DIAGNOSIS — E785 Hyperlipidemia, unspecified: Secondary | ICD-10-CM | POA: Insufficient documentation

## 2011-02-24 DIAGNOSIS — F329 Major depressive disorder, single episode, unspecified: Secondary | ICD-10-CM | POA: Insufficient documentation

## 2011-02-24 DIAGNOSIS — F3289 Other specified depressive episodes: Secondary | ICD-10-CM | POA: Insufficient documentation

## 2011-02-25 ENCOUNTER — Inpatient Hospital Stay (HOSPITAL_COMMUNITY): Payer: Medicare PPO

## 2011-02-25 ENCOUNTER — Emergency Department (HOSPITAL_COMMUNITY): Payer: Medicare PPO

## 2011-02-25 DIAGNOSIS — G459 Transient cerebral ischemic attack, unspecified: Secondary | ICD-10-CM

## 2011-02-25 LAB — CBC
MCV: 82.8 fL (ref 78.0–100.0)
Platelets: 184 10*3/uL (ref 150–400)
Platelets: 177 10*3/uL (ref 150–400)
RBC: 4.88 MIL/uL (ref 3.87–5.11)
RDW: 15.6 % — ABNORMAL HIGH (ref 11.5–15.5)
RDW: 15.5 % (ref 11.5–15.5)
WBC: 8.9 10*3/uL (ref 4.0–10.5)
WBC: 7.6 10*3/uL (ref 4.0–10.5)

## 2011-02-25 LAB — COMPREHENSIVE METABOLIC PANEL
AST: 18 U/L (ref 0–37)
Albumin: 3.3 g/dL — ABNORMAL LOW (ref 3.5–5.2)
Alkaline Phosphatase: 111 U/L (ref 39–117)
BUN: 15 mg/dL (ref 6–23)
BUN: 16 mg/dL (ref 6–23)
Calcium: 9.1 mg/dL (ref 8.4–10.5)
Creatinine, Ser: 0.92 mg/dL (ref 0.4–1.2)
Creatinine, Ser: 0.81 mg/dL (ref 0.4–1.2)
GFR calc Af Amer: >60 mL/min (ref >60)
Glucose, Bld: 149 mg/dL — ABNORMAL HIGH (ref 70–99)
Potassium: 4.3 mEq/L (ref 3.5–5.1)
Total Protein: 7 g/dL (ref 6.0–8.3)
Total Protein: 6.5 g/dL (ref 6.0–8.3)

## 2011-02-25 LAB — HOMOCYSTEINE: Homocysteine: 9.3 umol/L (ref 4.0–15.4)

## 2011-02-25 LAB — POCT CARDIAC MARKERS
CKMB, poc: <1 ng/mL — ABNORMAL LOW (ref 1.0–8.0)
Myoglobin, poc: 75.6 ng/mL (ref 12–200)
Troponin i, poc: <0.05 ng/mL (ref 0.00–0.09)

## 2011-02-25 LAB — GLUCOSE, CAPILLARY
Glucose-Capillary: 202 mg/dL — ABNORMAL HIGH (ref 70–99)
Glucose-Capillary: 217 mg/dL — ABNORMAL HIGH (ref 70–99)
Glucose-Capillary: 202 mg/dL — ABNORMAL HIGH (ref 70–99)

## 2011-02-25 LAB — DIFFERENTIAL
Basophils Absolute: 0 10*3/uL (ref 0.0–0.1)
Eosinophils Absolute: 0.2 10*3/uL (ref 0.0–0.7)
Eosinophils Relative: 3 % (ref 0–5)
Neutrophils Relative %: 48 % (ref 43–77)

## 2011-02-25 LAB — APTT: aPTT: 40 seconds — ABNORMAL HIGH (ref 24–37)

## 2011-02-25 LAB — CARDIAC PANEL(CRET KIN+CKTOT+MB+TROPI)
CK, MB: 1.3 ng/mL (ref 0.3–4.0)
Relative Index: INVALID (ref 0.0–2.5)
Total CK: 71 U/L (ref 7–177)
Total CK: 75 U/L (ref 7–177)

## 2011-02-25 LAB — URINALYSIS, ROUTINE W REFLEX MICROSCOPIC
Bilirubin Urine: NEGATIVE
Ketones, ur: NEGATIVE mg/dL
Protein, ur: NEGATIVE mg/dL
Urobilinogen, UA: 1 mg/dL (ref 0.0–1.0)

## 2011-02-26 LAB — BASIC METABOLIC PANEL
Chloride: 101 mEq/L (ref 96–112)
Creatinine, Ser: 0.75 mg/dL (ref 0.4–1.2)
GFR calc Af Amer: >60 mL/min (ref >60)

## 2011-02-26 LAB — CBC
Hemoglobin: 13 g/dL (ref 12.0–15.0)
MCH: 26.5 pg (ref 26.0–34.0)
MCHC: 31.5 g/dL (ref 30.0–36.0)
Platelets: 175 10*3/uL (ref 150–400)

## 2011-02-26 LAB — HEPATIC FUNCTION PANEL
Alkaline Phosphatase: 103 U/L (ref 39–117)
Indirect Bilirubin: 0.2 mg/dL — ABNORMAL LOW (ref 0.3–0.9)
Total Protein: 7.1 g/dL (ref 6.0–8.3)

## 2011-02-26 LAB — GLUCOSE, CAPILLARY: Glucose-Capillary: 191 mg/dL — ABNORMAL HIGH (ref 70–99)

## 2011-02-26 LAB — LIPID PANEL
Cholesterol: 122 mg/dL (ref 0–200)
LDL Cholesterol: 56 mg/dL (ref 0–99)
Total CHOL/HDL Ratio: 4.2 RATIO

## 2011-02-26 LAB — CARDIAC PANEL(CRET KIN+CKTOT+MB+TROPI)
Relative Index: INVALID (ref 0.0–2.5)
Total CK: 74 U/L (ref 7–177)
Troponin I: <0.3 ng/mL (ref <0.30)

## 2011-02-26 NOTE — H&P (Signed)
NAMESEEMA, [name]                ACCOUNT NO.:  [id number]  MEDICAL RECORD NO.:  [id number]           PATIENT TYPE:  I  LOCATION:  3329                         FACILITY:  [facility]  PHYSICIAN:  Jana Hakim, M.D. DATE OF BIRTH:  10-Jan-1956  DATE OF ADMISSION:  02/25/2011 DATE OF DISCHARGE:                             HISTORY & PHYSICAL   PRIMARY CARE PHYSICIAN:  Dr. Seth Bake in [place], [place].  CHIEF COMPLAINT:  Transient right-sided facial and arm numbness.  HISTORY OF PRESENT ILLNESS:  This is a 55 year old female who was brought to the emergency department after suffering 5 episodes of transient right-sided numbness, which started in the right shoulder, radiating up into the neck and face areas.  She states the first episode began 1 day ago.  Then she had 4 episodes the following day.  Each episode lasted less than a minute and was followed with a headache.  She denied having any chest pain or shortness of breath.  She denied having any syncope or seizure activity.  She denies having any previous similar episodes to this.  The patient denies having any nausea, vomiting, or photophobia associated with the events as well.  The patient presented to the emergency department close to 5 hours after the last episode.  PAST MEDICAL HISTORY:  Significant for: 1. Hypertension. 2. Type 2 diabetes mellitus. 3. Dyslipidemia. 4. Osteoarthritis. 5. Depression. 6. Psoriasis. 7. History of esophageal stricture with dilatations x4. 8. Benign breast cyst, both breasts, status post biopsies x2. 9. C-section x2. 10.Status post bilateral total knee replacements.  MEDICATIONS:  Janumet, folic acid, Vytorin, methotrexate, glimepiride, amlodipine, losartan, Cymbalta, Levemir FlexPen insulin, and adalimumab.  ALLERGIES:  No known drug allergies.  SOCIAL HISTORY:  The patient is married with children.  She is a nonsmoker and nondrinker.  No history of illicit drug usage.  FAMILY  HISTORY:  Positive for coronary artery disease in her father, positive cerebrovascular accident in a maternal aunt, positive hypertension in both parents.  No history of cancer in her family.  REVIEW OF SYSTEMS:  Pertinent as mentioned above.  PHYSICAL EXAMINATION FINDINGS:  GENERAL:  This is a 55 year old obese Caucasian female who is in no visible discomfort or acute distress. VITAL SIGNS:  Temperature 97.7, blood pressure 144/90, heart rate 87, respirations 16, and O2 saturations 98% to 100%. HEENT:  Normocephalic and atraumatic.  Pupils equally round and reactive to light.  Extraocular movements are intact.  Funduscopic benign.  There is no scleral icterus.  Nares are patent bilaterally.  Oropharynx is clear. NECK:  Supple.  Full range of motion.  No thyromegaly, adenopathy, or jugular venous distention. CARDIOVASCULAR:  Regular rate and rhythm.  Normal S1 and S2.  No murmurs, gallops, or rubs appreciated. LUNGS:  Clear to auscultation bilaterally.  No rales, rhonchi, or wheezes. ABDOMEN:  Positive bowel sounds, soft, nontender, and nondistended.  No hepatosplenomegaly. EXTREMITIES:  Without cyanosis, clubbing, or edema. NEUROLOGICAL:  The patient is alert and oriented x3.  There is no facial asymmetry.  Cranial nerves are intact.  There is no pronator drifting. Motor and sensory function are intact.  Cerebellar function also  intact. Deep tendon reflexes intact and gait is steady.  LABORATORY STUDIES:  White blood cell count 8.9, hemoglobin 13.0, hematocrit 40.6, MCV 83.2, platelets 184, neutrophils 48%, and lymphocytes 37%.  Sodium 136, potassium 3.9, chloride 99, CO2 of 30, BUN 15, creatinine 0.92, glucose 149, calcium 9.1, albumin 3.6, AST 16, and ALT 12.  CT scan of the head reveals no acute intracranial findings.  No evidence of acute hemorrhage, acute infarction, or mass lesion.  No ventriculomegaly.  No skull fractures and the orbits and paranasal sinuses are intact.  No  soft tissue abnormalities.  ASSESSMENT:  This is a 55 year old female being admitted with: 1. Transient ischemic attacks. 2. Hypertension. 3. Type 2 diabetes mellitus, on insulin therapy. 4. Psoriasis. 5. Osteoarthritis. 6. Obesity.  PLAN:  The patient will be admitted for further evaluation of the transient ischemic attacks.  She will be placed on the TIA protocol and an MRI study will be performed in the a.m.  Cardiac enzymes will also continued to be performed and the patient will be placed on aspirin therapy at this time.  She is not on aspirin therapy.  The patient's regular medications will be further verified and reconciled and the patient will be placed on DVT prophylaxis.  Further workup will ensue pending results of the patient's clinical course and her studies.  She will have further evaluation of her risk factors as well.  She is a full code.     Jana Hakim, M.D.     HJ/MEDQ  D:  02/25/2011  T:  02/25/2011  Job:  [id number]  Electronically Signed by Jana Hakim M.D. on 02/26/2011 03:46:08 AM

## 2011-02-27 NOTE — Assessment & Plan Note (Signed)
Carrie [name] OFFICE NOTE   NAME:Carrie [name], Carrie [name]                       MRN:          [id number]  DATE:06/23/2007                            DOB:          [birth year]    PROBLEM:  Dysphagia.   Carrie [name] has returned again complaining of dysphagia to solids.  She  underwent esophageal dilatation of a distal stricture in November of  2007.  She was well into the last few weeks, when she has noted  recurrent dysphagia to solids.  She remains on Nexium.   OTHER MEDICATIONS:  Include, methotrexate, folate, Tricor, Niaspan,  Janumet, prednisone, __________ , Cymbalta, Glimepiride, Enablex,  Actonel, insulin, Simvastin, Colazal.   PHYSICAL EXAMINATION:  VITAL SIGNS:  Pulse 88, blood pressure of 96/54.   IMPRESSION:  Recurrent esophageal stricture.  A candida infection of the  esophagus is another possibility but less likely.   RECOMMENDATIONS:  Repeat endoscopy with balloon dilatation.     Carrie [name]. Carrie Ina, MD,FACG  Electronically Signed    RDK/MedQ  DD: 06/23/2007  DT: 06/23/2007  Job #: [id number]

## 2011-02-27 NOTE — Op Note (Signed)
Carrie [name], Carrie [name]                ACCOUNT NO.:  [id number]   MEDICAL RECORD NO.:  [id number]          PATIENT TYPE:  AMB   LOCATION:  DSC                          FACILITY:  [facility]   PHYSICIAN:  Monico Blitz. Dalldorf, M.D.DATE OF BIRTH:  [birth year]   DATE OF PROCEDURE:  03/11/2007  DATE OF DISCHARGE:                               OPERATIVE REPORT   PREOPERATIVE DIAGNOSIS:  Left heel plantar fascitis.   POSTOPERATIVE DIAGNOSIS:  Left heel plantar fascitis.   PROCEDURE:  Left heel endoscopic plantar fascia release.   ANESTHESIA:  General   SURGEON:  Melrose Nakayama MD   ASSISTANT:  Roselee Nova PA   INDICATIONS FOR PROCEDURE:  The patient is a 55 year old woman with a  very long history of left heel pain.  This has persisted despite shoe  inserts, stretching program as well as the use of oral anti-  inflammatories including prednisone.  She also has had a least 3  cortisone injections into the inflamed area all of which have afforded  her transient relief.  She continues with pain which limits her ability  to walk and rest and she is offered an endoscopic plantar fascial  release.  Informed operative consent was obtained after discussion of  possible complications of reaction to anesthesia and infection as well  as neurovascular injury.   DESCRIPTION OF PROCEDURE:  Under general anesthesia through an  endoscopic approach, a left plantar fascial release was performed.  We  did use the Topaz machine in hopes of stimulating some angiogenesis.  The patient was taken to the operating suite where general anesthetic  was induced without difficulty.  She was positioned supine and prepped,  draped normal sterile fashion.  After the administration of IV Kefzol, a  small portal was made medial with introduction of a cannula across the  lateral border of the heel where another small incision was made.  The  plantar fascia was well visualized.  We then used the Topaz machine to  perform the  partial release.  I did place some scissors within the heel  to release a small portion of the medial border of the plantar fascial  origin off the heel.  The tourniquet was deflated and the toes became  pink and warm immediately.  We applied pressure to the portals until  bleeding ceased.  Some Marcaine was injected about the heel followed by  reapproximation of portals loosely with nylon.  Adaptic was applied  followed by dry gauze and loose ace wrap.  Estimated blood loss and  fluids as well as accurate tourniquet time can be obtained from  anesthesia records.   DISPOSITION:  The patient was extubated in the operating room and taken  to recovery in stable addition.  She was to go home the same-day and  follow-up in the office in 1 week. I will contact her by phone tonight.      Monico Blitz Rhona Raider, M.D.  Electronically Signed     PGD/MEDQ  D:  03/11/2007  T:  03/11/2007  Job:  [id number]

## 2011-02-27 NOTE — Op Note (Signed)
Carrie [name], Carrie [name]                ACCOUNT NO.:  [id number]   MEDICAL RECORD NO.:  [id number]          PATIENT TYPE:  INP   LOCATION:  6294                         FACILITY:  [facility]   PHYSICIAN:  Monico Blitz. Dalldorf, M.D.DATE OF BIRTH:  1955-11-06   DATE OF PROCEDURE:  11/13/2007  DATE OF DISCHARGE:                               OPERATIVE REPORT   PREOPERATIVE DIAGNOSIS:  Right knee degenerative joint disease.   POSTOPERATIVE DIAGNOSIS:  Right knee degenerative joint disease.   PROCEDURE:  Right total knee replacement.   ANESTHESIA:  General and block.   ATTENDING SURGEON:  Monico Blitz. Rhona Raider, M.D   ASSISTANT:  Roselee Nova, P.A.-C.   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  She has pain which limits her ability to walk and rest.  We  have treated her right knee with viscosupplementation agents, cortisone  injections, various oral anti-inflammatories, and actually performed an  arthroscopy two years ago.  She has advanced degenerative change.  She  is offered a knee replacement at this point.  Informed operative consent  was obtained after a discussion of possible complications of reaction to  anesthesia, infection, DVT, PE, and death.   SUMMARY OF FINDINGS AND PROCEDURE:  Under general anesthesia and a block  through a standard longitudinal anterior approach, a right total knee  replacement was performed.  She had advanced degenerative change in all  three compartments.  She had boggy inflamed synovium with fairly cloudy  joint fluid.  A thorough synovectomy was done and the fluid was sent for  a stat gram stain which was benign.  She had good bone quality.  We  addressed her problem with a cemented DePuy LCS system including Zinacef  antibiotic in the cement.  We placed a size 3 tibia, standard femur,  12.5 mm deep dish spacer, and a 35 mm all polyethylene  patella.  Chauncey Cruel, P.A.-C., assisted throughout and was invaluable to the  completion of the case in that he helped position and retract while I  performed the procedure.  He also closed simultaneously to help minimize  OR time.   DESCRIPTION OF PROCEDURE:  The patient was taken to the operating suite  where general anesthetic was applied without difficulty.  She was also  given a block in the preanesthesia area.  She was positioned supine and  prepped and draped in a normal sterile fashion.  After the  administration of IV Kefzol, the right leg was elevated, exsanguinated,  and tourniquet inflated about the thigh.  A longitudinal anterior  incision was made with dissection down the extensor mechanism.  All  appropriate anti-infective measures were used including preoperative IV  Kefzol, Betadine impregnated drape, and closed hooded exhaust systems  for each member of the surgical team.  A medial parapatellar incision  was made in the extensor mechanism.  The kneecap was flipped and the  knee flexed.  She had an abundance of cloudy joint fluid which was sent  to the lab.  A stat gram stain showed no organisms and just occasional  white cells.  She had thick, boggy synovium and we elected to perform a  thorough synovectomy which was done sharply.  The wound was thoroughly  irrigated followed by thorough exposure of the tibia.  An extramedullary  guide was placed here to make a cut on this bone parallel with the floor  with a slight posterior tilt.  An intramedullary guide was then placed  in the femur to make anterior and posterior cuts creating a flexion gap  of 10 mm.  A second intramedullary guide was placed in the femur to make  a distal cut creating an equal extension gap of 10 mm.  This all seemed  a bit tight so we elected to re-cut the tibia an additional 2.5 mm to  make our flexion and extension gaps 12.5 mm.  The femur sized to a  standard and the tibia to a 3 and  appropriate guides were placed and  utilized.  The patella was cut down in thickness by 8 mm to 15 and sized  to a 35 with the appropriate guide placed and utilized.  A trial  reduction was done with the aforementioned components and she easily  came to full extension and flexed well.  The patella tracked well.  The  trial components were removed followed by pulsatile lavage and  irrigation of all three cut bony surfaces.  Cement was mixed including  1.5 grams of Zinacef.  This was pressurized onto the bones followed by  placement of the aforementioned DePuy LCS components.  Excess cement was  trimmed and pressure was held on the components until the cement had  hardened.  The knee ranged well and easily came to slight  hyperextension.  The patella tracked well.  The tourniquet was deflated  and a small amount of bleeding was easily controlled with Bovie cautery.  The knee was thoroughly irrigated followed by placement of a drain  exiting superolaterally.  The extensor mechanism was reapproximated with  #1 Vicryl in an interrupted fashion followed by subcutaneous  reapproximation with 0 and 2-0 undyed Vicryl and skin closure with  staples.  Adaptic was applied followed by dry gauze and a loose Ace  wrap.  Estimated blood loss and intraoperative fluids can be obtained  from anesthesia records as can an accurate tourniquet time which was  just over 1 hour.   DISPOSITION:  The patient was extubated in the operating room and taken  to the recovery room in stable condition.  She was to be admitted to the  orthopedic surgery service for appropriate postop care to include  perioperative antibiotics and Coumadin plus Lovenox for DVT prophylaxis.      Monico Blitz Rhona Raider, M.D.  Electronically Signed     PGD/MEDQ  D:  11/13/2007  T:  11/13/2007  Job:  [id number]

## 2011-03-02 NOTE — Discharge Summary (Signed)
Carrie [name], Carrie [name]                ACCOUNT NO.:  [id number]   MEDICAL RECORD NO.:  [id number]          PATIENT TYPE:  INP   LOCATION:  5010                         FACILITY:  [facility]   PHYSICIAN:  Monico Blitz. Dalldorf, M.D.DATE OF BIRTH:  [birth year]   DATE OF ADMISSION:  10/26/2004  DATE OF DISCHARGE:  10/29/2004                                 DISCHARGE SUMMARY   ADMITTING DIAGNOSES:  1.  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  2.  History of asthma.  3.  History of depression.  4.  History of gastroesophageal reflux disease.   DISCHARGE DIAGNOSES:  1.  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  2.  History of asthma.  3.  History of depression.  4.  History of gastroesophageal reflux disease.   OPERATION:  Revision of the patellar component of her left total knee  replacement.   BRIEF HISTORY:  Carrie [name] is a 55 year old white female patient well known  to our practice who originally had a total knee replacement on June 06, 2004, and postoperatively was having some popping in the patellar region,  and this really got back in November 2005.  There were some changes.  There  were some x-rays taken, and it was felt that her patella may be slightly  lateral.  We discussed with her revising her patella and moving it so that  this discomfort and dysfunction would be taken care of.  We discussed  treatment options, surgery or conservative, and then the risks of  anesthesia, infection, DVT, and possible death.   PERTINENT LABORATORY AND X-RAY FINDINGS:  Chest x-ray:  No acute  cardiopulmonary disease.  WBC 6.3, RBC 3.56, hemoglobin 10.8, hematocrit  31.4, platelets 193.  Sodium 137, potassium 4, glucose 108, BUN 6,  creatinine 0.8, calcium 8.3.   COURSE IN THE HOSPITAL:  She was kept on her home medicines, Zoloft 100 mg  daily; prednisone 5 mg daily; methotrexate 5 mg daily; metoclopramide 10 mg  t.i.d.; Protonix 40 mg daily;  BuSpar 10 mg t.i.d.; Asmanex 220 mg 1 puff  daily; and albuterol 200 mg q.4 h.  She was on a PCA pump of morphine, IV  Ancef 1 g q.8 h. x 3 doses.  She was on Lovenox while in the hospital and  then discontinued on discharge, Percocet for pain, Phenergan for nausea,  ice, elevation, knee-high TEDs, incentive spirometry q.1 h. while awake,  knee immobilizer as needed, could be discontinued.  Follow-up lab work, as  described above, and CPM machine 0-6 six to eight hours a day.  On her first  day postop, blood pressure was 104/64; temperature 98.  Hemoglobin was 11.3,  glucose 108.  Lungs clear to A&P.  Abdomen was soft.  Left knee wound was  benign.  No sign of irritation or infection.  She was eating and oriented.  Lovenox was only to be used while in the hospital, and we discontinued it on  discharge.  No Coumadin.  Dressing will be changed the second day postop.  She will be sent home on an  81 mg aspirin.  The second day postop, her knee  was hurting, but her dressing was changed.  Her wound was benign.  No sign  of irritation or infection.  Calf was soft, nontender.  Negative Homans'.  We discontinued her PCA pump, her IV, and she was discharged on the third  day postop, with temperature 94.4.  Vital signs were stable.  Left knee  wound was benign.   CONDITION ON DISCHARGE:  Improved.   FOLLOWUP:  She will be given a prescription for Percocet 5 mg one q.4-6 h.;  Flexeril 10 mg one p.o. t.i.d. as need for spasm; and then kept on Zoloft  100 mg one daily; prednisone 5 mg one daily; methotrexate 5 mg one daily;  metoclopramide 10 mg t.i.d.; Protonix 40 mg daily; BuSpar 10 mg t.i.d.; and  Asmanex 220 one puff daily; and albuterol 200 one q.4.  She can ice, elevate  her knee, working range of motion, and ambulate, weightbearing as tolerated.   DIET:  Unrestricted.   Any signs of infection, she is to call [phone].  As of note, I did not fill  out the discharge instruction to patient  sheet.      MC/MEDQ  D:  01/04/2005  T:  01/04/2005  Job:  [id number]

## 2011-03-02 NOTE — Assessment & Plan Note (Signed)
[facility] OFFICE NOTE   NAME:Taber, JENISSE VULLO                       MRN:          [id number]  DATE:08/29/2006                            DOB:          06/02/1956    PROBLEM:  Food getting stuck.   HISTORY:  Izella is a pleasant 55 year old white female known to Dr. Deatra Ina  who was last seen in June of 2007; at which time, she underwent upper  endoscopy for complaints of dysphagia.  She was found to have a distal  esophageal stricture and this was balloon dilated to 18 mm without  difficulty.  The patient says that the dilation did help her for several  months but that her symptoms have recurred over the past month or so and she  is now having daily symptoms of solid food dysphagia and dysphagia to pills.  She has had a couple episodes of regurgitation.  She is having some  breakthrough heartburn as well and says that she wakes up at night  occasionally coughing and choking but without any sour brash.   CURRENT MEDICATIONS:  1. Methotrexate 2.5 daily.  2. Prednisone 5 mg daily.  3. Folic acid 5 mg daily.  4. Metformin 500 daily.  5. Tricor 145 daily.  6. Nexium 40 daily.  7. Niaspan 500 daily.  8. Janumet 50/500 daily.   ALLERGIES:  No known drug allergies.   EXAM:  Well-developed, well-nourished obese white female in no acute  distress.  Pleasant.  Weight is 236.  Blood pressure 118/78, pulse is 92.  CARDIOVASCULAR:  Regular rate and rhythm with S1-S2.  PULMONARY:  Clear to A&P.  ABDOMEN:  Obese, soft.  Bowel sounds are active.  She is nontender.   IMPRESSION:  A 55 year old white female with chronic gastroesophageal reflux  disease with stricture, recurrent dysphagia consistent with stricture.   PLAN:  1. Repeat EGD with balloon dilation.  2. Have reviewed an antireflux regimen including elevation of the head of      the bed and n.p.o.      for at least two hours after dinner.  3. Continue  Nexium at 40 mg p.o. daily.      Nicoletta Ba, PA-C  Electronically Signed      Sandy Salaam. Deatra Ina, MD,FACG  Electronically Signed   AE/MedQ  DD: 08/29/2006  DT: 08/29/2006  Job #: [id number]

## 2011-03-02 NOTE — Op Note (Signed)
[facility]  Patient:    Carrie [name], Carrie [name]                       MRN: [id number] Proc. Date: 12/27/99 Adm. Date:  [id number] Attending:  Josefa Half A Dictator:   Josefa Half, M.D.                           Operative Report  PREOPERATIVE DIAGNOSIS:       Incomplete abortion, endometritis.  POSTOPERATIVE DIAGNOSIS:      Incomplete abortion, endometritis.  OPERATION:                    Examination under anesthesia, dilatation and evacuation.  SURGEON:                      Josefa Half, M.D.  ASSISTANT:  ANESTHESIA:                   MAC, paracervical block with 1% lidocaine.  IV FLUIDS:                    300 cc of Ringers lactate.  ESTIMATED BLOOD LOSS:         Minimal.  URINE OUTPUT:                 I&O prior to the procedure.  COMPLICATIONS:                None.  SPECIMENS:                    Products of conception was sent to pathology.  INDICATIONS:                  The patient was a 55 year old, gravida 3, para 2-0-1-2, Caucasian female with a last menstrual period of September 23, 2000, status post spontaneous abortion December 09, 1999, who presented with the complaint of continued vaginal bleeding and spotting since the spontaneous abortion.  The patient reported pad changes q.1h. of several weeks duration.  The patient initially had an ultrasound performed in the office on December 07, 1999, at which time an intrauterine pregnancy was confirmed.  The patient went on to develop heavy vaginal bleeding on December 09, 1999, and had a follow-up ultrasound on November 20, 1999, which confirmed that she had a spontaneous abortion.  Since that time, the patient has had ongoing heavy and constant vaginal bleeding and backache.  She denied any dizziness or fever.  The patient was treated with Methergine 0.2 mg .o. t.i.d. for two days starting on December 22, 1999, and this was not significantly helpful in decreasing her vaginal  bleeding.  On examination, the patient was noted to have dilation of the cervical os to 1 m. There was clot sitting at the cervical os, the vagina was blood-stained.  There was foul-smelling vaginal blood appreciated.  On bimanual examination the patient had no evidence of cervical motion tenderness.  The uterus was approximately 8 weeks size, anteverted, mobile, and nontender.  There was some evidence of left adnexal tenderness and no evidence of a mass.  There was no right adnexal tenderness or a mass.  The patient had a quantitative beta hCG of 74, and a hematocrit of 29.2%.  Her blood type was noted to be A positive.  The patient did undergo a preoperative ultrasound which demonstrated possible retained products  of conception in the lower uterine segment which measured 4.3 cm in the largest diameter.  There was also evidence of possible endometritis.  The right ovary was noted to be within normal limits, the left ovary was not visualized.  There was no evidence of fluid in the cul-de-sac.  The patient was diagnosed with retained products of conception and endometritis.  A discussion was held with the patient regarding options for care and the decision was made to proceed with a dilatation and evacuation after the risks and benefits were reviewed.  FINDINGS:                     Examination under anesthesia revealed an 8 weeks size, anteverted, mobile uterus.  No adnexal masses were appreciated.  The uterus was sounded to 11.5 cm.  DESCRIPTION OF PROCEDURE:     With an IV in place, the patient was taken to the  operating room after she received Cefotetan 2 grams IV.  The patient did develop erythema and pruritis along both of her arms and the palms of her hands after receiving this medication.  The patient arrived in the operating room suite, and she received MAC anesthesia. The patient was then placed in the dorsal lithotomy position, and the  perineum as sterilely prepped and draped.  The urinary bladder was I&O catheterized with a ed rubber catheter.  An examination under anesthesia was performed, the findings are as noted above.  A speculum was placed inside the vagina and a single tooth tenaculum was placed on the anterior cervical lip.  The uterus was sounded and the cervix was noted to e dilated.  A suction curet tip was then inserted to the level of the fundus and suction was applied.  This was withdrawn in a circular motion and the products f conception were collected and sent to pathology.  A suction curet was passed two times and then the endometrial cavity was curetted using a serrated curet.  The  endometrium had a characteristic gritty texture to it at this time.  The remaining blood and clots were removed from the vagina, and the suction curet was used one final time to remove any remaining blood from within the uterine cavity.  The patient was noted to have some ongoing bleeding coming actively from within the  cervix at this time.  Therefore, Methergine 0.2 mg was administered intramuscularly x 1.  This resulted in effective contraction of the uterus and decreased bleeding. The single tooth tenaculum was removed from the anterior cervical lip, and there was a small area of bleeding noted at this site.  This was treated with a silver nitrate stick for effective hemostasis.  The speculum was removed from the vagina, and the patient was taken out of the dorsal lithotomy position.  She was awakened and escorted to the recovery room n stable and awake condition.  There were no complications for the procedure. DD:  12/27/99 TD:  12/27/99 Job: [id number]

## 2011-03-02 NOTE — Op Note (Signed)
Carrie, [name]                ACCOUNT NO.:  [id number]   MEDICAL RECORD NO.:  [id number]          PATIENT TYPE:  INP   LOCATION:  2857                         FACILITY:  [facility]   PHYSICIAN:  Monico Blitz. Dalldorf, M.D.DATE OF BIRTH:  [birth year]   DATE OF PROCEDURE:  10/26/2004  DATE OF DISCHARGE:                                 OPERATIVE REPORT   PREOPERATIVE DIAGNOSIS:  Painful left patellar component.   POSTOPERATIVE DIAGNOSIS:  Painful left patellar component.   PROCEDURE:  Revision left knee patellar component.   ANESTHESIA:  General.   ATTENDING SURGEON:  Monico Blitz. Rhona Raider, M.D.   ASSISTANT:  Roselee Nova, P.A.   INDICATIONS FOR PROCEDURE:  The patient is a 55 year old woman about five  months from a the left knee replacement. She did fairly well until about a  month ago when she fell and struck the anterior aspect of her knee. She has  had a painful patellar component since that time with x-ray evidence of  significant tilt. We tried immobilization and rest but she continued with  difficulty. It is felt that she likely ruptured a portion of the medial  retinaculum and has loosened her patellar component. She is offered revision  of the patellar component alone at this point. Informed operative consent  was obtained after discussion of possible complications of reaction to  anesthesia, infection, DVT, PE, and death.   DESCRIPTION OF PROCEDURE:  The patient is in the operating suite where  general anesthetic was applied without difficulty. She was positioned supine  and prepped in normal sterile fashion. After administration of preoperative  IV antibiotics, the left leg was elevated, exsanguinated, and a tourniquet  inflated about the thigh. A Betadine impregnated drape was placed. Most of  her old incision was utilized with dissection down to the extensor  mechanism. A medial parapatellar incision was made and the kneecap was  flipped. The patellar component did seem  to be a bit loose but there was no  evidence of any fracture or bone loss. The patellar poly component was  removed and an oscillating saw was used to create a new cut on the  undersurface of the patella. This then sized to a 35 and  the appropriate  guide was placed and utilized. Cement was mixed including antibiotics and  this was pressurized onto this cut bony surface followed by placed another  size 35 DePuy all polyethylene patellar button. Pressure was held on the  component until cement hardened and excess cement was trimmed. The knee then  ranged very well and the patella tracked appeared to track in the normal  position. The knee was thoroughly irrigated with pulsatile lavage. The  tourniquet was deflated and a small amount bleeding was easily controlled  with Bovie cautery. At this point we performed a reefing of the medial  retinaculum of the medial retinaculum with #1 Vicryl. Subcutaneous tissues  were then reapproximated with 0 and 2-0 undyed Vicryl followed by skin  closure with staples. Adaptic was applied to the wound, dry gauze and a  loose Ace wrap. Estimated blood loss and intraoperative  fluids can be  obtained from anesthesia records as can accurate tourniquet time which was  less than half an hour.   DISPOSITION:  The patient was extubated in the operating room and taken to  recovery in stable condition. Plans were for her to be admitted to the  orthopedic surgery service for appropriate postoperative care to include  perioperative antibiotics and Coumadin plus Lovenox for DVT prophylaxis.      PGD/MEDQ  D:  10/26/2004  T:  10/26/2004  Job:  [id number]

## 2011-03-02 NOTE — Op Note (Signed)
[name], [name]                ACCOUNT NO.:  [id number]   MEDICAL RECORD NO.:  [id number]          PATIENT TYPE:  EMS   LOCATION:  MAJO                         FACILITY:  [facility]   PHYSICIAN:  Monico Blitz. Dalldorf, M.D.DATE OF BIRTH:  10/21/1955   DATE OF PROCEDURE:  10/30/2005  DATE OF DISCHARGE:  10/25/2005                                 OPERATIVE REPORT   PREOP DIAGNOSES:  1.  Right knee torn medial meniscus.  2.  Right knee degenerative joint disease.   POSTOPERATIVE DIAGNOSES:  1.  Right knee torn medial meniscus.  2.  Right knee degenerative joint disease.   PROCEDURES:  1.  Right knee partial meniscectomy.  2.  Right knee abrasion chondroplasty.   ANESTHESIA:  General.   ATTENDING SURGEON:  Monico Blitz. Rhona Raider, M.D.   ASSISTANTAlmira Bar, PA   INDICATIONS FOR PROCEDURE:  The patient is a 55 year old woman with a long  history of bilateral knee pain. She is status post a successful knee  replacement on the opposite side but has been troubled with right knee pain  for many months. This has persisted despite oral anti-inflammatories and  injections. By x-ray, she has some mild to moderate joint space narrowing.  She has pain which limits her ability to rest and remain active and she is  offered an arthroscopy in hopes of delaying the point at which she might  need a knee replacement. Informed operative consent was obtained after  discussion of possible complications of reaction to anesthesia and  infection.   DESCRIPTION OF PROCEDURE:  The patient was taken to an operating suite where  general anesthetic was applied without difficulty. She was positioned supine  and prepped, draped in normal sterile fashion. After the administration of  preop IV antibiotic, an arthroscopy of the right knee was performed through  two inferior portals. Suprapatellar pouch was benign as was the  patellofemoral joint. Medial compartment exhibited degenerative tearing of  the middle and  posterior horns of the medial meniscus. About 25% partial  medial meniscectomy was done back to stable tissues. She did have grade 3  change across the broad area of the medial femoral condyle addressed with a  thorough chondroplasty. She had grade 4 change on a focal area of the medial  tibial plateau addressed with a brief abrasion to the bleeding bone. The  lateral compartment was benign with no evidence of meniscal or articular  cartilage injury. The ACL appeared to be intact. The knee was thoroughly  irrigated at the end of the case followed by placement of Marcaine with  epinephrine and some Depo-Medrol. Adaptic was placed over the portals  followed by dry gauze and loose Ace wrap. Estimated blood loss and fluids  obtained from anesthesia records.   DISPOSITION:  The patient was extubated in the operating room and taken to  recovery room in stable addition. Plans were for her to go home the same-day  and to follow up in the office in less than a week. I will contact her by  phone tonight.      Monico Blitz Rhona Raider, M.D.  Electronically Signed     PGD/MEDQ  D:  10/30/2005  T:  10/30/2005  Job:  [id number]

## 2011-03-02 NOTE — Discharge Summary (Signed)
NAMEDAJON, ROWE                ACCOUNT NO.:  [id number]   MEDICAL RECORD NO.:  [id number]          PATIENT TYPE:  INP   LOCATION:  5002                         FACILITY:  [facility]   PHYSICIAN:  Monico Blitz. Dalldorf, M.D.DATE OF BIRTH:  [birth year]   DATE OF ADMISSION:  06/06/2004  DATE OF DISCHARGE:  06/10/2004                                 DISCHARGE SUMMARY   ADMISSION DIAGNOSES:  1.  Left knee end-stage degenerative joint disease.  2.  History of rheumatoid arthritis.   DISCHARGE DIAGNOSES:  1.  Left knee end-stage degenerative joint disease.  2.  History of rheumatoid arthritis.   OPERATION:  Left total knee replacement.   BRIEF HISTORY:  Carrie [name] is a 55 year old white female patient well known  to our practice, had a longstanding history of left knee pain.  We have done  a previous knee arthroscopy on her and she does have some end-stage DJD  changes.  Her pain has gotten worse.  She is having pain when she ambulates,  pain at nighttime, trouble being comfortable going to sleep, and we have  discussed treatment options with her that being a total knee replacement and  the risks of anesthesia, infection, DVT, and possible death.   PERTINENT LABORATORY AND X-RAY DATA:  EKG sinus tachycardia.  WBCs 13.9,  RBCs 3.45, hemoglobin 11.2.  INR last testing was 2.1.  Sodium 137,  potassium 4.8, glucose 149, BUN 9, creatinine 1.1.   COURSE IN THE HOSPITAL:  Postoperatively, she was on an IV of lactated  ringers, IV Ancef one gram q.8 h. x3 doses, pharmacy for Coumadin and  Lovenox DVT prophylaxis, Percocet for pain, Phenergan for nausea.  She was  kept on her usual medications which were metoclopramide 10 mg t.i.d.,  Protonix 40 mg daily, prednisone 5 mg daily, methotrexate 2.5 mg seven tabs  on Wednesday.  She was also stressed steroid dosed at the time of surgery,  IV Solu-Medrol 50 mg at the time of surgery and then 25 mg q.8 h. x1 day.  Foley catheter, incentive spirometry, and  knee-hi TEDs for DVT prophylaxis.  Physical therapy to be weightbearing as tolerated, pharmacy for daily pro  times while on Coumadin.  She was also on a CPM machine.  The first day  postop her temp was 97.6, blood pressure 99/65, heart rate was 73,  hemoglobin 12.8, INR 1.1, Hemovac was in place, had drained 100 mL, dressing  was dry, good neurovascular status, Foley catheter was also in place.  No  sign of infection or irritation.  Lungs were clear to A&P and her abdomen  was soft as well.  The second day postop her Foley was discontinued, her  dressing was changed, drains were pulled, blood pressure 111/64, heart rate  69, hemoglobin 11.2, INR 1.6, CPM machine was advancing 0 to 75 degrees.  Once again no sign of irritation or infection, lungs were clear, cardiac S1  & S2, abdomen soft, positive bowel sounds.  Arville Go had been arranged for  home health for her blood draws and physical therapy.  She did have a mild  emotional set back where her ex-husband had committed suicide and her  daughter from their marriage was extremely upset which caused Carrie [name] to  be upset as well.  Some Ativan was given to her for anxiety.  She was  discharged home.   CONDITION ON DISCHARGE:  Improved.   CURRENT MEDICATIONS FOLLOWING DISCHARGE:  1.  Coumadin dosed per pharmacy protocol.  2.  Percocet one or two tablets every 4 hours for pain as needed.  3.  Prednisone 5 mg one pill a day.  4.  Protonix 40 mg one a day.  5.  Reglan 10 mg one pill three times a day.  6.  Methotrexate 2.5 mg seven tabs once a week.   Weightbearing as tolerated, diet no restrictions.  May change her dressing  on a daily basis.  Arville Go for home physical therapy and INR--blood draws.  Warned of any signs of infection--redness, drainage, increasing pain to call  [phone] and that same number for return appointment for follow up.  May use  ice and elevation, keep this clean and dry, and if problems arise she will   call.       MC/MEDQ  D:  07/08/2004  T:  07/09/2004  Job:  [id number]

## 2011-03-02 NOTE — Op Note (Signed)
NAME:  Carrie [name], Carrie [name]                          ACCOUNT NO.:  [id number]   MEDICAL RECORD NO.:  [id number]                   PATIENT TYPE:  INP   LOCATION:  5002                                 FACILITY:  [facility]   PHYSICIAN:  Monico Blitz. Rhona Raider, M.D.             DATE OF BIRTH:  [birth year]   DATE OF PROCEDURE:  06/06/2004  DATE OF DISCHARGE:                                 OPERATIVE REPORT   PREOPERATIVE DIAGNOSIS:  Left knee degenerative arthritis.   POSTOPERATIVE DIAGNOSIS:  Left knee degenerative arthritis.   PROCEDURE:  Left total knee replacement.   SURGEON:  Monico Blitz. Rhona Raider, M.D.   ASSISTANT:  Lynden Ang, M.D., Roselee Nova, P.A.   ANESTHESIA:  General.   INDICATIONS FOR PROCEDURE:  The patient is a 55 year old woman with a long  history of left knee pain. This has persisted despite oral anti-  inflammatories, multiple injections, and 2 arthroscopies at which point, end-  stage degeneration was seen in the medial compartment. She has pain which is  basically disabling in that she cannot walk without discomfort and cannot  rest. She was offered a knee replacement operation.   Informed operative consent was obtained after discussion of possible  complications of, reactions to anesthesia, infection, DVT, PE and death.   DESCRIPTION OF PROCEDURE:  The patient was taken to the operating suite  where general anesthetic was applied without difficulty.  She was positioned  supine, prepped and draped in normal sterile fashion.  After administration  of IV antibiotics, the left leg was elevated, exsanguinated, tourniquet  inflated about the thigh.  A longitudinal incision was made.  All  appropriate anti-infective measures were used, including Betadine  impregnated drape, preop IV Kefzol and closed, hooded exhaust systems for  each member of the surgical team.   Dissection was carried down to the extensor mechanism through a moderate  amount of adipose tissue. A medial  parapatellar incision was made through  this structure and the knee cap was flipped with the knee flexed. Some  residual meniscal tissues were removed, as were the ACL and PCL.  She had  end-stage degeneration medial compartment but good bone quality.   An extramedullary guide was placed on the tibia in order to create a cut on  this bone with a slight posterior tilt.  An intramedullary guide was then  placed in the femur, in order to create anterior and posterior cuts, making  a flexion gap of about 10 mm.  A second intramedullary guide was placed in  the femur in order to make a distal cut, creating an extension gap equal of  10 mm; thereby balancing the knee. The tibia sized to a size 3 while the  femur to a standard plus.   The appropriate guides were placed and utilized. The patella was cut down to  final thickness by about 8 mm to 15 and a 32 guide seemed appropriate  and  was utilized.   A trial reduction was done with these components in the 10 mm spacer.  They  easily came to full extension and flexed well. The knee cap tracked well and  no  lateral release was required.  All trial components were removed followed by  pulsatile lavage  irrigation of all cut, bony surfaces.   Cement was mixed including antibiotic. This was placed on all the bones  followed by the DuPuy LCS components, which were standard femur, 3 tibia and  10 mm deep-dish spacer and 32 mm all polyethylene patella.  Cement excess  was removed and pressure was held on the components until the cement had  hardened. The  tourniquet was deflated and a small amount of bleeding was easily controlled  with Bovie cautery.   The extensor mechanism was reapproximated with #1 Vicryl in interrupted  fashion, followed by 0 and 2-0 undyed Vicryl in the subcutaneous tissues,  followed by skin closure with staples. Adaptic was applied to the wound,  followed by dry gauze and a loose Ace wrap. Estimated blood loss and   intraoperative fluids can be obtained from anesthesia records, after an  adequate tourniquet time.   DISPOSITION:  The patient was extubated in the operating room and taken to  the recovery room in stable condition.  Plans were for her to be admitted to  the orthopedic surgery service for recovery for postop care, to include  perioperative antibiotics and Coumadin plus Lovenox for DVT prophylaxis.  She is also to receive some stress steroids, as she is on 5 mg of prednisone  daily on a chronic basis.                                               Monico Blitz Rhona Raider, M.D.    PGD/MEDQ  D:  06/06/2004  T:  06/06/2004  Job:  [id number]

## 2011-03-02 NOTE — Discharge Summary (Signed)
NAMESAMARA, STANKOWSKI                ACCOUNT NO.:  [id number]   MEDICAL RECORD NO.:  [id number]          PATIENT TYPE:  INP   LOCATION:  6283                         FACILITY:  [facility]   PHYSICIAN:  Monico Blitz. Dalldorf, M.D.DATE OF BIRTH:  Apr 02, 1956   DATE OF ADMISSION:  11/13/2007  DATE OF DISCHARGE:  11/16/2007                               DISCHARGE SUMMARY   DISCHARGE DIAGNOSES:  1. Right knee end-stage degenerative joint disease.  2. Insulin-dependent diabetes mellitus.  3. History of rheumatoid arthritis.  4. History of psoriasis.  5. Status post left total knee replacement.  6. Hypertension.  7. Asthma.  8. Esophageal reflux.   DISCHARGE DIAGNOSES:  1. Right knee end-stage degenerative joint disease.  2. Insulin-dependent diabetes mellitus.  3. History of rheumatoid arthritis.  4. History of psoriasis.  5. Status post left total knee replacement.  6. Hypertension.  7. Asthma.  8. Esophageal reflux.   PROCEDURE:  Right total knee replacement   BRIEF HISTORY:  Carrie [name] is a patient well-known to our practice, a 55-  year-old white female who has had increasing right knee pain.  Had  originally had an arthroscopy done October 30, 2005 which revealed end-  stage grade 4 bone-on-bone DJD.  She had also tried various therapeutic  injections, nonsteroidal anti-inflammatory drugs, and is now having pain  with every step and pain when she sleeps at night.  She also on x-ray  has end-stage DJD changes as well.   PERTINENT LABORATORY AND X-RAY DATA:  EKG showed normal sinus rhythm  with a prolonged QT.   Hemoglobin 12.2.  Glucose 67.  This was checked serially, as she was on  a diabetic glycemic order control sheet.  INR 1.2.  Other lab indices  can be found in the medical record which were not available at time of  this dictation.   Postoperatively, she was placed on a variety of p.o. and IM analgesics  for pain.  A PCA Dilaudid pump was used, full dose protocol.  She was  kept on her home medications which will be outlined at the end of this  dictation, and a glycemic order control sheet was also pulled up.  She  had an IV of lactated Ringer's, advanced diet slowly, Coumadin and  Lovenox DVT prophylaxis protocol per pharmacy, various antiemetics,  stool softeners, iron sulfate, and was given knee-high TEDs and  incentive spirometry also used.  She was in a CPM machine  postoperatively, initially 0-50 degrees 6-8 hours a day and then  advanced as tolerated 10 degrees daily.  IV Ancef a gram q.8h. x3 doses.  The first day postop, her blood pressure was 99/68, temperature 98,  hemoglobin 12.2, glucose 67, INR 1.2.  CPM was 70/50.  Foley catheter  was in place.  Her lungs were clear.  Abdomen was soft.  No sign of  infection, and her right lower extremity dressing was dry.  Autovac was  in and functioning.  Second day postop, Foley catheter was discontinued.  Drain was pulled.  Dressing was changed.  The wound was noted to be  within normal  limits.  No sign of infection.  Physical therapy had been  ordered for weightbearing as tolerated with crutches or walker, pharmacy  for low-dose Coumadin protocol for regulation of DVT prophylaxis, and  she was progressing well and was discharged home.  She did have a  decreased hemoglobin, with a drop in her hemoglobin to 7.9.  Blood loss  anemia was discussed with the patient at length.  She had no significant  dizziness or shortness of breath.  She would prefer at this point to  avoid blood transfusion.   CONDITION ON DISCHARGE:  Improved.   DIET:  She will be on a low-sodium heart-healthy diet.   WOUND CARE:  She may change her dressing daily.   ACTIVITY:  Weightbearing as tolerated with crutches or walker.   FOLLOW UP:  Call Dr. Jerald Kief office for an appointment in 10 days of  if there is any sign of infection at [phone].   Home INRs and home PT was arranged as well.   DISCHARGE MEDICATIONS:  1. Coumadin  5 mg take a half a tablet tonight on day of discharge and      then as described by home health agency.  She will be on Coumadin      for 2 weeks.  2. Percocet 1-2 q.4-6h. p.r.n. pain.  3. Albuterol inhaler 4 times a day.  4. Enablex 7.5 mg daily.  5. Cozaar 50 mg daily.  6. Simvastatin 40 mg daily.  7. Tricor 1 daily.  8. Oxycodone 1-2 q.4-6h. p.r.n. pain.  9. Methotrexate 2.5 weekly.  10.Folic acid 5 daily.  11.Prednisone 1 mg 3 tablets daily.  12.Actonel 25 mg daily.  13.Nystatin powder as needed.  14.Nexium 40 daily.  15.Humalog on her sliding scale as she knows it.  16.Glipizide 4 mg daily.  17.Repan 1 daily.  18.Cymbalta 30 mg daily.   Once again, for any sign of infection or problems, she will call our  office at [phone] and will be seen immediately.      Roselee Nova, P.A.      Monico Blitz Rhona Raider, M.D.  Electronically Signed    MC/MEDQ  D:  12/16/2007  T:  12/16/2007  Job:  [id number]

## 2011-03-02 NOTE — Op Note (Signed)
[facility]  Patient:    Carrie [name], Carrie [name]                       MRN: [id number] Proc. Date: 05/27/01 Adm. Date:  [id number] Attending:  Diona Browner M                           Operative Report  PREOPERATIVE DIAGNOSIS:  Multiple nonrestorable, carious teeth, #3, 6, 7, 8, 9, 11, 12, 13, 20, 21, 22, 23, 24, 25, 26, 27, 28, 29, 31.  POSTOPERATIVE DIAGNOSIS:  Multiple nonrestorable, carious teeth, #3, 6, 7, 8, 9, 11, 12, 13, 20, 21, 22, 23, 24, 25, 26, 27, 28, 29, 31.  PROCEDURE:  Full mouth extractions, teeth #3, 6, 7, 8, 9, 11, 12, 13, 20, 21, 22, 23, 24, 25, 26, 27, 28, 29, 31, and alveoloplasty, four quadrants.  SURGEON:  Gae Bon, D.M.D.  ANESTHESIA:  General oral, Finis Bud, M.D., was the attending.  DESCRIPTION OF PROCEDURE:  The patient was taken to the operating room and placed on the table in supine position.  General anesthesia was administered via intravenous medications, and oral endotracheal tube was placed and marked. The patient was prepped and draped, and a throat pack was placed.  Then 2% lidocaine and 1:100,000 of epinephrine was infiltrated as an inferior alveolar block on the right and left sides and in buccal and palatal infiltration around the teeth to be removed.  A total of nine carpules of solution was utilized.  A #15 blade was used to make a full-thickness incision around teeth #20, 21, 22, 23, 24, 25, and 26.  Periosteal elevator was used to reflect the periosteum around these teeth, and then the teeth were elevated with the 301 dental elevator and removed with an Ash forceps.  Sockets were curetted, the periosteum was further reflected to reveal the bony contour irregularities, which were reduced and smoothed with an egg-shaped bur.  Then the area was irrigated and closed with 3-0 chromic.  Then in the left maxilla, teeth # 7, 8, 9, 11, 12, 13 were surgically removed using a 15 blade, a periosteal elevator, 301  elevator, and upper 150 forceps.  Teeth #12 and 13 fractured upon attempted removal, and then a 702 Fisher bur under irrigation was used to make bony troughs around these roots, and then the roots were removed with the 301 elevator.  Then periosteum was reflected to reveal the buccal alveolus, which was smoothed with an egg-shaped bur to perform the alveoloplasty.  Then the area was irrigated and closed with 3-0 chromic.  The bite block and the endotracheal tube were repositioned.  Attention was turned to the right mandible.  Teeth #27, 28, 29, and 31 were removed using a 15 blade for a full-thickness incision, periosteal elevator to reflect the flap, then a 301 elevator and 151 forceps were used to remove the teeth.  The periosteum was further reflected for greater exposure of the buccal alveolus, which was then trimmed with an egg-shaped bur and smoothed with the irrigation.  Then the irrigation was closed with 3-0 chromic.  In the left maxilla, teeth #3 and 6 were removed using a periosteal elevator and #150 forceps, the sockets were curetted, then alveoloplasty was performed around tooth #6, which had a large bony contour irregularity on the buccal surface.  This was done with an egg-shaped bur.  At the termination of the procedure, the area was  irrigated copiously with normal saline and suctioned.  The throat pack was then removed. The patient tolerated the procedure well.  EBL minimum.  Complications:  None. Specimens:  None.  The patient was extubated, taken to the recovery room breathing spontaneously, in good condition. DD:  05/27/01 TD:  05/27/01 Job: [id number]

## 2011-03-08 NOTE — Discharge Summary (Signed)
[name], Carrie [name]                ACCOUNT NO.:  [id number]  MEDICAL RECORD NO.:  [id number]           PATIENT TYPE:  O  LOCATION:  2426                         FACILITY:  [facility]  PHYSICIAN:  Domingo Mend, M.D. DATE OF BIRTH:  [birth year]  DATE OF ADMISSION:  02/24/2011 DATE OF DISCHARGE:  02/26/2011                              DISCHARGE SUMMARY   PRIMARY CARE PHYSICIAN:  Eligah East, MD in [place]: 1. Transient ischemic attack. 2. Hypertension. 3. Type 2 diabetes. 4. Hyperlipidemia. 5. Rheumatoid arthritis. 6. Depression. 7. Psoriasis. 8. History of esophageal strictures. 9. Status post bilateral total knee replacements.  DISCHARGE MEDICATIONS:  Include: 1. Aspirin 81 mg daily. 2. Advil 200 mg to take 2 tablets every 8 hours as needed for pain or     headache. 3. Albuterol 2 puffs every 4 hours as needed for shortness of breath. 4. Norvasc 5 mg daily. 5. Artificial tears 1 drop in both eyes daily as needed. 6. Cymbalta 60 mg daily. 7. Folic acid 1 mg to take 2 tablets daily. 8. Amaryl 4 mg daily. 9. Humira one injection every 2 weeks. 10.Janumet 100/1000 mg 1 tablet daily. 11.Losartan 100 mg daily. 12.Methotrexate 2.5 mg to take 10 tablets every week on Tuesdays. 13.Nexium 40 mg daily. 14.Vytorin 10/40 one tablet daily.  DISPOSITION AND FOLLOWUP:  Carrie [name] will be discharged home today in stable and improved condition.  She is to follow up with her PCP in 2 to 3 weeks.  At that time, results of her 2-D echo should be followed up on.  CONSULTATION THIS HOSPITALIZATION:  None.  IMAGES AND PROCEDURES:  Include: 1. A chest x-ray on Feb 25, 2011, that showed no acute abnormality. 2. A CT scan of the head on Feb 25, 2011, that showed no acute     intracranial findings. 3. MRI, MRA of the brain on Feb 25, 2011, that was negative but showed     minimal sinus disease, small right mastoid effusion and a negative     MRA. 4. She had carotid  Dopplers on Feb 25, 2011, that showed no ICA     stenosis bilaterally and an antegrade vertebral flow.  HISTORY AND PHYSICAL:  For complete details, see the dictation by Dr. Arnoldo Morale on Feb 25, 2011; but in brief, Carrie [name] is a pleasant 55 year old Caucasian lady with a history of hypertension, diabetes, hyperlipidemia, presented to the hospital with 5 distinct episodes of numbness of the right neck, shoulder and arm on the right. Because of this, she was admitted for further evaluation.  HOSPITAL COURSE BY PROBLEM:  Right-sided numbness.  This will be most consistent with a TIA.  Workup has shown no evidence for a CVA.  Her fasting lipid profile looks good and is as follows:  Total cholesterol 122, triglycerides 184, HDL 29 and LDL 56.  She has had carotid Dopplers that showed no evidence of stenosis and she has also had a 2-D echo, unfortunately these results are pending at time of this dictation.  She has been started on a baby aspirin a day.  She has been counseled on  risk factor modification including weight loss as well as good control of above-mentioned issues.  If these episodes recur, then of course cervical myelopathy is within the differential, so at that point may be a C-spine MRI would be indicated.  Rest of her chronic conditions have been stable and her home medications have not been altered.  Vitals on day of discharge, blood pressure 132/84, heart rate 87, respirations 20, sats of 95% on room air, and temperature of 97.6.     Domingo Mend, M.D.     EH/MEDQ  D:  02/26/2011  T:  02/26/2011  Job:  [id number]  cc:   Eligah East, MD Fax: [phone]  Electronically Signed by Domingo Mend M.D. on 03/08/2011 06:47:46 PM

## 2011-03-27 ENCOUNTER — Emergency Department (HOSPITAL_COMMUNITY): Payer: Medicare PPO

## 2011-03-27 ENCOUNTER — Emergency Department (HOSPITAL_COMMUNITY)
Admission: EM | Admit: 2011-03-27 | Discharge: 2011-03-27 | Disposition: A | Discharge status: Home / Self Care | Payer: Medicare PPO | Attending: Emergency Medicine | Admitting: Emergency Medicine

## 2011-03-27 DIAGNOSIS — R0602 Shortness of breath: Secondary | ICD-10-CM | POA: Insufficient documentation

## 2011-03-27 DIAGNOSIS — Z794 Long term (current) use of insulin: Secondary | ICD-10-CM | POA: Insufficient documentation

## 2011-03-27 DIAGNOSIS — I10 Essential (primary) hypertension: Secondary | ICD-10-CM | POA: Insufficient documentation

## 2011-03-27 DIAGNOSIS — Z8673 Personal history of transient ischemic attack (TIA), and cerebral infarction without residual deficits: Secondary | ICD-10-CM | POA: Insufficient documentation

## 2011-03-27 DIAGNOSIS — E119 Type 2 diabetes mellitus without complications: Secondary | ICD-10-CM | POA: Insufficient documentation

## 2011-03-27 DIAGNOSIS — M7989 Other specified soft tissue disorders: Secondary | ICD-10-CM | POA: Insufficient documentation

## 2011-03-27 DIAGNOSIS — F411 Generalized anxiety disorder: Secondary | ICD-10-CM | POA: Insufficient documentation

## 2011-03-27 LAB — URINALYSIS, ROUTINE W REFLEX MICROSCOPIC
Bilirubin Urine: NEGATIVE
Ketones, ur: NEGATIVE mg/dL
Nitrite: NEGATIVE
Protein, ur: NEGATIVE mg/dL
Specific Gravity, Urine: 1.013 (ref 1.005–1.030)
Urobilinogen, UA: 1 mg/dL (ref 0.0–1.0)

## 2011-03-27 LAB — CBC
MCH: 26.7 pg (ref 26.0–34.0)
MCHC: 32.3 g/dL (ref 30.0–36.0)
Platelets: 170 10*3/uL (ref 150–400)
RBC: 4.45 MIL/uL (ref 3.87–5.11)
RDW: 14.8 % (ref 11.5–15.5)

## 2011-03-27 LAB — BASIC METABOLIC PANEL
BUN: 13 mg/dL (ref 6–23)
Calcium: 9 mg/dL (ref 8.4–10.5)
Creatinine, Ser: 0.75 mg/dL (ref 0.4–1.2)
GFR calc Af Amer: >60 mL/min (ref >60)
GFR calc non Af Amer: >60 mL/min (ref >60)

## 2011-03-27 LAB — DIFFERENTIAL
Basophils Relative: 0 % (ref 0–1)
Eosinophils Absolute: 0.1 10*3/uL (ref 0.0–0.7)
Eosinophils Relative: 2 % (ref 0–5)
Monocytes Absolute: 0.8 10*3/uL (ref 0.1–1.0)
Monocytes Relative: 10 % (ref 3–12)
Neutrophils Relative %: 49 % (ref 43–77)

## 2011-03-27 LAB — GLUCOSE, CAPILLARY: Glucose-Capillary: 332 mg/dL — ABNORMAL HIGH (ref 70–99)

## 2011-03-27 LAB — PRO B NATRIURETIC PEPTIDE: Pro B Natriuretic peptide (BNP): 43.3 pg/mL (ref 0–125)

## 2011-03-27 LAB — D-DIMER, QUANTITATIVE: D-Dimer, Quant: 1.73 ug/mL-FEU — ABNORMAL HIGH (ref 0.00–0.48)

## 2011-03-28 ENCOUNTER — Ambulatory Visit (HOSPITAL_COMMUNITY)
Admission: RE | Admit: 2011-03-28 | Discharge: 2011-03-28 | Disposition: A | Discharge status: Home / Self Care | Payer: Medicare PPO | Source: Ambulatory Visit | Attending: Emergency Medicine | Admitting: Emergency Medicine

## 2011-03-28 DIAGNOSIS — M79609 Pain in unspecified limb: Secondary | ICD-10-CM

## 2011-03-28 DIAGNOSIS — M7989 Other specified soft tissue disorders: Secondary | ICD-10-CM | POA: Insufficient documentation

## 2011-03-28 DIAGNOSIS — E669 Obesity, unspecified: Secondary | ICD-10-CM | POA: Insufficient documentation

## 2011-04-17 ENCOUNTER — Telehealth: Payer: Self-pay | Admitting: Gastroenterology

## 2011-04-19 NOTE — Telephone Encounter (Signed)
Patient calling to report nausea. States when she eats "food comes back up." States she is taking Nexium daily. She will increase this to BID. Schedule patient to see Nicoletta Ba, PA on 04/20/11 at 2:30 PM

## 2011-04-20 ENCOUNTER — Ambulatory Visit (INDEPENDENT_AMBULATORY_CARE_PROVIDER_SITE_OTHER): Payer: Medicare PPO | Admitting: Physician Assistant

## 2011-04-20 ENCOUNTER — Encounter: Payer: Self-pay | Admitting: Physician Assistant

## 2011-04-20 VITALS — BP 134/76 | HR 100 | Ht 60.0 in | Wt 219.0 lb

## 2011-04-20 DIAGNOSIS — K219 Gastro-esophageal reflux disease without esophagitis: Secondary | ICD-10-CM

## 2011-04-20 DIAGNOSIS — R110 Nausea: Secondary | ICD-10-CM

## 2011-04-20 MED ORDER — ONDANSETRON 4 MG PO TBDP: ORAL_TABLET | ORAL | Status: DC

## 2011-04-20 NOTE — Progress Notes (Signed)
Subjective:    Patient ID: Carrie [name], female    DOB: December 19, 1955, 55 y.o.   MRN: [id number]  HPI Carrie [name] is a 55 year old female known to Dr. Deatra Ina who has been followed for GERD and history of esophageal stricture. Her last endoscopy was in January 2011 and she did have a Maloney dilation of a GE junction stricture and was noted to have mild gastritis. She has multiple other medical problems including insulin-dependent diabetes mellitus, rheumatoid arthritis, depression, psoriasis, hyperlipidemia and hypertension. She had an admission in May of 2012 with a TIA.  She comes in today with complaints of constant nausea over the past month. Her only new  medication is metformin which was just started a couple of weeks ago and she said the nausea definitely predated this. At this point she is brought nauseated every day and says she usually wakes up nauseated and remains nauseated throughout the day. She has some episodes of vomiting were not daily. She is not having any regular abdominal pain but does note early satiety and a general decrease in her appetite. Her weight has been stable, she has no complaints of changes in bowel habits melena or hematochezia. She also has noted some recurrence of solid food dysphagia which has been intermittent. She has not noticed any change in her nausea with or without by mouth intake. She was taking Advil for short period of time after her hospitalization in May but has not taken any over the past several weeks and is not using any other NSAIDs. She stays on Nexium 40 mg daily chronically.  On further questioning she is a very strong family history of gallbladder disease in multiple relatives.    Review of Systems  Constitutional: Positive for appetite change.  HENT: Positive for trouble swallowing.   Eyes: Negative.   Respiratory: Negative.   Cardiovascular: Negative.   Gastrointestinal: Positive for nausea.  Genitourinary: Negative.   Musculoskeletal:  Negative.   Skin: Negative.   Neurological: Negative.   Hematological: Negative.   Psychiatric/Behavioral: Negative.        Outpatient Encounter Prescriptions as of 04/20/2011  Medication Sig Dispense Refill  . amLODipine (NORVASC) 5 MG tablet Take 5 mg by mouth daily.       Marland Kitchen aspirin 81 MG tablet Take 81 mg by mouth daily.        . CYMBALTA 60 MG capsule One by mouth once daily       . esomeprazole (NEXIUM) 40 MG capsule Take 40 mg by mouth daily before breakfast.        . folic acid (FOLVITE) 1 MG tablet Take 1 mg by mouth daily.        . furosemide (LASIX) 20 MG tablet Two by mouth once daily      . glimepiride (AMARYL) 4 MG tablet One tablet by mouth once daily       . HUMIRA 40 MG/0.8ML injection Injection every two weeks       . LEVEMIR FLEXPEN 100 UNIT/ML injection 3 units once daily at bedtime       . LORazepam (ATIVAN) 0.5 MG tablet Take 0.5 mg by mouth. As needed       . losartan (COZAAR) 100 MG tablet One by mouth once daily       . metFORMIN (GLUCOPHAGE) 1000 MG tablet One tablet by mouth twice a day       . SINGULAIR 10 MG tablet One by mouth once a week      . VYTORIN  10-40 MG per tablet One by mouth once daily      . ondansetron (ZOFRAN-ODT) 4 MG disintegrating tablet Take 1 tab twice daily as needed for nausea  60 tablet  1     Objective:   Physical Exam Well-developed female in no acute distress, alert and oriented x3 HEENT;nontraumatic normocephalic EOMI PERRLA sclera anicteric  Neck; supple no JVD  Cardiovascula;r regular rate and rhythm with S1-S2 no murmur rub or gallop  Pulmonary; clear bilaterally  Abdomen; large soft minimally tender in the epigastrium and right upper quadrant no guarding no rebound no palpable mass or hepatosplenomegaly, no succussion splash, bowel sounds presen t Rectal; not done  Skin; warm and dry benign, early stasis changes bilaterally in the lower extremities. Psych; mood and affect normal an appropriate.        Assessment & Plan:  #50  55 year old female insulin-dependent diabetic, with history of chronic GERD, with complaints of nausea, constant x1 month. Rule out gastroparesis, rule out gallbladder disease, rule out peptic ulcer disease, rule out medication-induced symptoms  Plan; Continue Nexium 40 mg by mouth daily B[place] diet Avoid NSAIDs Start Zofran 4 mg twice daily with breakfast and dinner. Schedule for upper abdominal ultrasound Schedule for gastric emptying scan Followup office visit with Dr. Deatra Ina after the above have been completed  #2 Chronic GERD with history of esophageal stricture. Patient with recurrent intermittent solid food dysphagia  Plan; may need repeat EGD with dilation, but will await for results of above prior to scheduling.

## 2011-04-20 NOTE — Progress Notes (Signed)
Agree with initial assessment and plan. Also agree that she may need endoscopy

## 2011-04-20 NOTE — Patient Instructions (Signed)
We have scheduled the Abd. Ultrasound for 04-25-11.  Wednesday. We have scheduled the Gastric Emptying Scan for 05-09-2011. We have sent a prescription for Zofran to your pharmacy. Follow up with Dr Deatra Ina after the procedures.

## 2011-04-25 ENCOUNTER — Ambulatory Visit (HOSPITAL_COMMUNITY)
Admission: RE | Admit: 2011-04-25 | Discharge: 2011-04-25 | Disposition: A | Discharge status: Home / Self Care | Payer: Medicare PPO | Source: Ambulatory Visit | Attending: Physician Assistant | Admitting: Physician Assistant

## 2011-04-25 ENCOUNTER — Telehealth: Payer: Self-pay | Admitting: *Deleted

## 2011-04-25 DIAGNOSIS — R109 Unspecified abdominal pain: Secondary | ICD-10-CM | POA: Insufficient documentation

## 2011-04-25 DIAGNOSIS — K7689 Other specified diseases of liver: Secondary | ICD-10-CM | POA: Insufficient documentation

## 2011-04-25 DIAGNOSIS — E119 Type 2 diabetes mellitus without complications: Secondary | ICD-10-CM | POA: Insufficient documentation

## 2011-04-25 DIAGNOSIS — R112 Nausea with vomiting, unspecified: Secondary | ICD-10-CM | POA: Insufficient documentation

## 2011-04-25 DIAGNOSIS — R110 Nausea: Secondary | ICD-10-CM

## 2011-04-25 NOTE — Telephone Encounter (Signed)
Message copied by Hulan Saas on Wed Apr 25, 2011  4:28 PM ------      Message from: [place], [place] S      Created: Wed Apr 25, 2011  3:49 PM       Please let pt know her abd US  Shows a fatty liver,normal gallbladder-see how she is feeling

## 2011-04-25 NOTE — Telephone Encounter (Signed)
Results given to patient. She states she is feeling okay.

## 2011-05-09 ENCOUNTER — Encounter (HOSPITAL_COMMUNITY): Payer: Self-pay

## 2011-05-09 ENCOUNTER — Encounter (HOSPITAL_COMMUNITY)
Admission: RE | Admit: 2011-05-09 | Discharge: 2011-05-09 | Disposition: A | Discharge status: Home / Self Care | Payer: Medicare PPO | Source: Ambulatory Visit | Attending: Physician Assistant | Admitting: Physician Assistant

## 2011-05-09 DIAGNOSIS — R110 Nausea: Secondary | ICD-10-CM

## 2011-05-09 DIAGNOSIS — R6881 Early satiety: Secondary | ICD-10-CM | POA: Insufficient documentation

## 2011-05-09 DIAGNOSIS — E119 Type 2 diabetes mellitus without complications: Secondary | ICD-10-CM | POA: Insufficient documentation

## 2011-05-09 DIAGNOSIS — R112 Nausea with vomiting, unspecified: Secondary | ICD-10-CM | POA: Insufficient documentation

## 2011-05-09 MED ORDER — TECHNETIUM TC 99M SULFUR COLLOID
2.0000 | Freq: Once | INTRAVENOUS | Status: AC | PRN
  Administered 2011-05-09: 2 via ORAL

## 2011-05-14 ENCOUNTER — Telehealth: Payer: Self-pay

## 2011-05-14 NOTE — Telephone Encounter (Signed)
Left message for pt to call back  °

## 2011-05-14 NOTE — Telephone Encounter (Signed)
Message copied by Faythe Casa on Mon May 14, 2011  3:02 PM ------      Message from: [place], [place] S      Created: Mon May 14, 2011  1:07 PM       Please be sure pt knows her gastric emptying study was also normal. She should have a follow up with Dr. Neila Gear

## 2011-05-14 NOTE — Telephone Encounter (Signed)
Message copied by Faythe Casa on Mon May 14, 2011  3:08 PM ------      Message from: [place], [place] S      Created: Mon May 14, 2011  1:07 PM       Please be sure pt knows her gastric emptying study was also normal. She should have a follow up with Dr. Neila Gear

## 2011-05-15 ENCOUNTER — Ambulatory Visit (INDEPENDENT_AMBULATORY_CARE_PROVIDER_SITE_OTHER): Payer: Medicare PPO | Admitting: Gastroenterology

## 2011-05-15 ENCOUNTER — Encounter: Payer: Self-pay | Admitting: Gastroenterology

## 2011-05-15 DIAGNOSIS — K222 Esophageal obstruction: Secondary | ICD-10-CM

## 2011-05-15 DIAGNOSIS — R110 Nausea: Secondary | ICD-10-CM | POA: Insufficient documentation

## 2011-05-15 NOTE — Progress Notes (Signed)
History of Present Illness:  Ms. Klumb has returned for followup of her nausea. Symptoms continue. She has nausea that is fairly constant and may be temporarily relieved with eating. She complains of early satiety. Gastric empty scan and ultrasound were unremarkable except for probable hepatic steatosis. There's been no change in her medications in the last few months. Weight is stable. She also complains of mild dysphagia to solids.    Review of Systems: Pertinent positive and negative review of systems were noted in the above HPI section. All other review of systems were otherwise negative.    Current Medications, Allergies, Past Medical History, Past Surgical History, Family History and Social History were reviewed in [place] record  Vital signs were reviewed in today's medical record. Physical Exam: General: Well developed , well nourished, no acute distress  Musculoskeletal: Symmetrical with no gross deformities  Pulses:  Normal pulses noted Extremities: No clubbing, cyanosis, edema or deformities noted Neurological: Alert oriented x 4, grossly nonfocal Psychological:  Alert and cooperative. Normal mood and affect

## 2011-05-15 NOTE — Assessment & Plan Note (Signed)
>>  ASSESSMENT AND PLAN FOR STRICTURE AND STENOSIS OF ESOPHAGUS WRITTEN ON 05/16/2011  8:47 AM BY Blenda Burdock D, MD  Patient is complaining of dysphagia which is likely due to a recurrent stricture.  Recommendations #1 upper endoscopy and dilatation as indicated

## 2011-05-15 NOTE — Assessment & Plan Note (Addendum)
Symptoms are suggestive of a medication effect although there's been no change in medications. Ulcer and nonulcer dyspepsia should be ruled out. There is no evidence for gastroparesis.  Medications #1 upper endoscopy

## 2011-05-15 NOTE — Assessment & Plan Note (Addendum)
Patient is complaining of dysphagia which is likely due to a recurrent stricture.  Recommendations #1 upper endoscopy and dilatation as indicated

## 2011-05-15 NOTE — Patient Instructions (Signed)
Upper GI Endoscopy Upper GI endoscopy means using a flexible scope to look at the esophagus, stomach and upper small bowel. This is done to make a diagnosis in people with heartburn, abdominal pain, or abnormal bleeding. Sometimes an endoscope is needed to remove foreign bodies or food that become stuck in the esophagus; it can also be used to take biopsy samples. For the best results, do not eat or drink for 8 hours before having your upper endoscopy.  To perform the endoscopy, you will probably be sedated and your throat will be numbed with a special spray. The endoscope is then slowly passed down your throat (this will not interfere with your breathing). An endoscopy exam takes 15-30 minutes to complete and there is no real pain. Patients rarely remember much about the procedure. The results of the test may take several days if a biopsy or other test is taken.  You may have a sore throat after an endoscopy exam. Serious complications are very rare. Stick to liquids and soft foods until your pain is better. You should not drive a car or operate any dangerous equipment for at least 24 hours after being sedated. SEEK IMMEDIATE MEDICAL CARE IF:  You have severe throat pain.   You have shortness of breath.   You have bleeding problems.   You have a fever.   You have difficulty recovering from your sedation.  Document Released: 11/08/2004 Document Re-Released: 12/26/2009 [facility] Patient Information 2011 [place]. Your Endoscopy is scheduled on 05/16/2011 at 3pm

## 2011-05-16 ENCOUNTER — Ambulatory Visit (AMBULATORY_SURGERY_CENTER): Payer: Medicare PPO | Admitting: Gastroenterology

## 2011-05-16 ENCOUNTER — Encounter: Payer: Self-pay | Admitting: Gastroenterology

## 2011-05-16 VITALS — HR 82 | Temp 98.4°F | Resp 19 | Ht 60.0 in | Wt 210.0 lb

## 2011-05-16 DIAGNOSIS — R110 Nausea: Secondary | ICD-10-CM

## 2011-05-16 DIAGNOSIS — K222 Esophageal obstruction: Secondary | ICD-10-CM

## 2011-05-16 MED ORDER — ESOMEPRAZOLE MAGNESIUM 40 MG PO CPDR: 40.0000 mg | DELAYED_RELEASE_CAPSULE | Freq: Two times a day (BID) | ORAL | Status: DC

## 2011-05-16 MED ORDER — INSULIN REGULAR HUMAN 100 UNIT/ML IJ SOLN: 5.0000 [IU] | Freq: Once | INTRAMUSCULAR | Status: DC

## 2011-05-16 MED ORDER — METOCLOPRAMIDE HCL 10 MG PO TABS: ORAL_TABLET | ORAL | Status: DC

## 2011-05-16 MED ORDER — SODIUM CHLORIDE 0.9 % IV SOLN: 500.0000 mL | INTRAVENOUS | Status: DC

## 2011-05-16 NOTE — Patient Instructions (Addendum)
Contact office  immediately  if you develop any side effects from her Reglan including paresthesias, tremors, confusion , weakness or muscle spasms. OV 1 month Discharge instructions given with verbal understanding. Handouts on esophagitis and a dilatation diet given. Increase Nexium to twice a day.  Trail of reglan 5m. Resume previous medications.

## 2011-05-16 NOTE — Telephone Encounter (Signed)
Pt aware and having an EGD today.

## 2011-05-16 NOTE — Progress Notes (Signed)
accucheck in Admitting- 339.  Dr. Deatra Ina informed and new order of Novolin R 5 units SQ received.  Novolin R 5 units SQ given; verified with Katharina Caper RN

## 2011-05-16 NOTE — Progress Notes (Signed)
Pt tolerated the egd with dil well. MAW

## 2011-05-17 ENCOUNTER — Telehealth: Payer: Self-pay | Admitting: *Deleted

## 2011-05-17 DIAGNOSIS — R112 Nausea with vomiting, unspecified: Secondary | ICD-10-CM

## 2011-05-17 MED ORDER — ONDANSETRON 4 MG PO TBDP: ORAL_TABLET | ORAL | Status: DC

## 2011-05-17 NOTE — Telephone Encounter (Signed)
:   Zofran 4 mg every 6 hours as needed for nausea #25

## 2011-05-17 NOTE — Telephone Encounter (Signed)
Follow up Call- Patient questions:  Do you have a fever, pain , or abdominal swelling? no Pain Score  0 *  Have you tolerated food without any problems? no  Have you been able to return to your normal activities? no  Do you have any questions about your discharge instructions: Diet   no Medications  no Follow up visit  no  Do you have questions or concerns about your Care? yes  Actions: * If pain score is 4 or above: Physician/ provider Notified : Erskine Emery, MD. Pt states that she has had nausea and vomiting since she got home yesterday. She cannot tolerated fluids or solids. She has severe nausea per the pt and she states it feels like there is something stuck in her throat. Please advise. E McCraw RN

## 2011-05-17 NOTE — Telephone Encounter (Signed)
Dr Deatra Ina ordered Zofran 4 mg Q 6 hours prn for this pt. Order e-scribed to pts pharmacy and pt notified of med ordered. Pt states that she still has some nausea but hasnt vomited since early this am with initial call at 0755. Has been able to keep tea down since this am and pt has no fever. No abdominal pain and she has voided several times. Pt instructed to call if has further problems.  E McCraw RN

## 2011-06-22 ENCOUNTER — Ambulatory Visit: Payer: Medicare PPO | Admitting: Gastroenterology

## 2011-07-05 LAB — BASIC METABOLIC PANEL
BUN: 13
CO2: 28
CO2: 28
Calcium: 7.9 — ABNORMAL LOW
Calcium: 9.5
Chloride: 104
Creatinine, Ser: 1.1
Creatinine, Ser: 1.12
Glucose, Bld: 100 — ABNORMAL HIGH
Glucose, Bld: 132 — ABNORMAL HIGH
Glucose, Bld: 67 — ABNORMAL LOW
Sodium: 134 — ABNORMAL LOW

## 2011-07-05 LAB — BODY FLUID CULTURE: Culture: NO GROWTH

## 2011-07-05 LAB — CBC
HCT: 37.1
Hemoglobin: 12.2
Hemoglobin: 9.4 — ABNORMAL LOW
MCHC: 33
MCHC: 33.2
MCV: 84.8
MCV: 85.1
MCV: 85.4
Platelets: 283
RDW: 13.6
RDW: 13.8
RDW: 13.9
WBC: 7.5

## 2011-07-05 LAB — PROTIME-INR
INR: 1.1
Prothrombin Time: 14.1
Prothrombin Time: 24.2 — ABNORMAL HIGH

## 2011-07-05 LAB — ANAEROBIC CULTURE

## 2011-07-05 LAB — HEMOGLOBIN A1C
Hgb A1c MFr Bld: 6
Mean Plasma Glucose: 136

## 2011-07-06 LAB — PROTIME-INR
INR: 3 — ABNORMAL HIGH
Prothrombin Time: 31.9 — ABNORMAL HIGH

## 2011-07-06 LAB — CBC
Hemoglobin: 7.9 — CL
MCHC: 33.7
Platelets: 292
RDW: 14

## 2011-07-06 LAB — BASIC METABOLIC PANEL
BUN: 13
CO2: 31
Calcium: 8.3 — ABNORMAL LOW
Creatinine, Ser: 0.91
GFR calc non Af Amer: >60
Glucose, Bld: 112 — ABNORMAL HIGH
Sodium: 138

## 2011-07-11 LAB — POCT I-STAT, CHEM 8
Hemoglobin: 14.6
Potassium: 6.4
Sodium: 136
TCO2: 31

## 2011-07-11 LAB — CBC
HCT: 39.8
MCV: 77.3 — ABNORMAL LOW
Platelets: 346
RDW: 16.8 — ABNORMAL HIGH
WBC: 9.8

## 2011-07-11 LAB — URINE MICROSCOPIC-ADD ON

## 2011-07-11 LAB — DIFFERENTIAL
Basophils Absolute: 0.3 — ABNORMAL HIGH
Eosinophils Absolute: 0.2
Eosinophils Relative: 2
Lymphocytes Relative: 32
Lymphs Abs: 3.1
Neutrophils Relative %: 58

## 2011-07-11 LAB — URINALYSIS, ROUTINE W REFLEX MICROSCOPIC
Bilirubin Urine: NEGATIVE
Ketones, ur: NEGATIVE
Nitrite: NEGATIVE
Protein, ur: NEGATIVE

## 2011-07-11 LAB — URINE CULTURE: Colony Count: >=100000

## 2011-07-11 LAB — POTASSIUM: Potassium: 4

## 2011-07-16 LAB — GLUCOSE, CAPILLARY: Glucose-Capillary: 120 — ABNORMAL HIGH

## 2011-08-22 ENCOUNTER — Encounter (HOSPITAL_COMMUNITY): Payer: Self-pay | Admitting: *Deleted

## 2011-08-22 ENCOUNTER — Emergency Department (HOSPITAL_COMMUNITY)
Admission: EM | Admit: 2011-08-22 | Discharge: 2011-08-22 | Disposition: A | Discharge status: Home / Self Care | Payer: Medicare PPO | Attending: Emergency Medicine | Admitting: Emergency Medicine

## 2011-08-22 DIAGNOSIS — M79609 Pain in unspecified limb: Secondary | ICD-10-CM

## 2011-08-22 DIAGNOSIS — M7989 Other specified soft tissue disorders: Secondary | ICD-10-CM

## 2011-08-22 DIAGNOSIS — M25561 Pain in right knee: Secondary | ICD-10-CM

## 2011-08-22 DIAGNOSIS — M25569 Pain in unspecified knee: Secondary | ICD-10-CM | POA: Insufficient documentation

## 2011-08-22 DIAGNOSIS — M25571 Pain in right ankle and joints of right foot: Secondary | ICD-10-CM

## 2011-08-22 DIAGNOSIS — M25579 Pain in unspecified ankle and joints of unspecified foot: Secondary | ICD-10-CM | POA: Insufficient documentation

## 2011-08-22 DIAGNOSIS — M25562 Pain in left knee: Secondary | ICD-10-CM

## 2011-08-22 DIAGNOSIS — I10 Essential (primary) hypertension: Secondary | ICD-10-CM | POA: Insufficient documentation

## 2011-08-22 DIAGNOSIS — Z8673 Personal history of transient ischemic attack (TIA), and cerebral infarction without residual deficits: Secondary | ICD-10-CM | POA: Insufficient documentation

## 2011-08-22 DIAGNOSIS — E119 Type 2 diabetes mellitus without complications: Secondary | ICD-10-CM | POA: Insufficient documentation

## 2011-08-22 DIAGNOSIS — Z96659 Presence of unspecified artificial knee joint: Secondary | ICD-10-CM | POA: Insufficient documentation

## 2011-08-22 HISTORY — DX: Transient cerebral ischemic attack, unspecified: G45.9

## 2011-08-22 HISTORY — DX: Plantar fascial fibromatosis: M72.2

## 2011-08-22 MED ORDER — OXYCODONE-ACETAMINOPHEN 5-325 MG PO TABS: 1.0000 | ORAL_TABLET | ORAL | Status: AC | PRN

## 2011-08-22 MED ORDER — OXYCODONE-ACETAMINOPHEN 5-325 MG PO TABS
1.0000 | ORAL_TABLET | Freq: Once | ORAL | Status: AC
  Administered 2011-08-22: 1 via ORAL
  Filled 2011-08-22: qty 1

## 2011-08-22 MED ORDER — DIAZEPAM 5 MG PO TABS: 5.0000 mg | ORAL_TABLET | Freq: Three times a day (TID) | ORAL | Status: AC | PRN

## 2011-08-22 NOTE — ED Provider Notes (Signed)
History     CSN: [id number] Arrival date & time: 08/22/2011 12:10 PM   First MD Initiated Contact with Patient 08/22/11 1325      Chief Complaint  Patient presents with  . Leg Pain    (Consider location/radiation/quality/duration/timing/severity/associated sxs/prior treatment) Patient is a 55 y.o. female presenting with leg pain. The history is provided by the patient.  Leg Pain  The incident occurred more than 1 week ago. There was no injury mechanism. The pain is present in the left knee, right knee and right ankle. The quality of the pain is described as aching. The pain is at a severity of 8/10. The pain is moderate. The pain has been constant since onset. Pertinent negatives include no numbness and no inability to bear weight. The symptoms are aggravated by bearing weight and activity. She has tried NSAIDs for the symptoms.  Pt with bilateral knee replacements in 2005 on left and 2008 on right. States joint pains increasing and she is unable to follow up. Denies injury, denies fever, chills, malaise.   Past Medical History  Diagnosis Date  . GERD (gastroesophageal reflux disease)   . Gastritis   . Stricture and stenosis of esophagus   . Obesity   . Diabetes mellitus   . Depression   . Asthma   . Hyperlipemia   . Hernia   . Hypertension   . Allergy   . Anxiety   . Arthritis     rheumatoid arithritis, DDD  . Osteoporosis   . Tuberculosis     tested positive 2011, no symptoms, was on medicine for 6 months  . TIA (transient ischemic attack)   . Plantar fasciitis     Past Surgical History  Procedure Date  . Knee surgery     Bilateral  replacement  . Cesarean section     x 2   . Breast surgery   . Joint replacement     bilat knee     Family History  Problem Relation Age of Onset  . Colon cancer Neg Hx   . Esophageal cancer Neg Hx   . Stomach cancer Neg Hx     History  Substance Use Topics  . Smoking status: Never Smoker   . Smokeless tobacco: Never Used    . Alcohol Use: Not on file    OB History    Grav Para Term Preterm Abortions TAB SAB Ect Mult Living                  Review of Systems  Constitutional: Negative.   HENT: Negative.   Respiratory: Negative.   Cardiovascular: Negative.   Gastrointestinal: Negative.   Musculoskeletal: Positive for joint swelling and arthralgias. Negative for back pain.  Neurological: Negative.  Negative for numbness.    Allergies  Review of patient's allergies indicates no known allergies.  Home Medications   Current Outpatient Rx  Name Route Sig Dispense Refill  . ASPIRIN 81 MG PO TABS Oral Take 81 mg by mouth daily.      Marland Kitchen CLOTRIMAZOLE-BETAMETHASONE 1-0.05 % EX LOTN Topical Apply topically 2 (two) times daily.      . CYMBALTA 60 MG PO CPEP  One by mouth once daily     . FOLIC ACID 1 MG PO TABS Oral Take 1 mg by mouth daily.      Marland Kitchen GLIMEPIRIDE 4 MG PO TABS  One tablet by mouth once daily     . VICTOZA [place] Subcutaneous Inject 1.8 Units into the skin 2 (  two) times daily.      Marland Kitchen LORAZEPAM 0.5 MG PO TABS Oral Take 0.5 mg by mouth. As needed     . METOCLOPRAMIDE HCL 10 MG PO TABS  Take 1/2 hr before each meal and at bedtime 90 tablet 1  . ONDANSETRON 4 MG PO TBDP  Take 1 tab every 6 hours as needed for nausea 25 tablet 0  . AMLODIPINE BESYLATE 5 MG PO TABS Oral Take 5 mg by mouth daily.     Marland Kitchen DIAZEPAM 5 MG PO TABS Oral Take 1 tablet (5 mg total) by mouth every 8 (eight) hours as needed for anxiety. 15 tablet 0  . ESOMEPRAZOLE MAGNESIUM 40 MG PO CPDR Oral Take 1 capsule (40 mg total) by mouth 2 (two) times daily. 60 capsule 2  . FUROSEMIDE 20 MG PO TABS  Two by mouth once daily    . HUMIRA 40 MG/0.8ML [place] KIT  Injection every two weeks     . LEVEMIR FLEXPEN 100 UNIT/ML [place] SOLN  3 units once daily at bedtime     . LOSARTAN POTASSIUM 100 MG PO TABS  One by mouth once daily     . METFORMIN HCL 1000 MG PO TABS  One tablet by mouth twice a day     . METHOTREXATE 2.5 MG PO TABS  10 tablets Once a week.     . OXYCODONE-ACETAMINOPHEN 5-325 MG PO TABS Oral Take 1 tablet by mouth every 4 (four) hours as needed for pain. 20 tablet 0  . SINGULAIR 10 MG PO TABS  One by mouth once a week    . VYTORIN 10-40 MG PO TABS  One by mouth once daily      BP 133/80  Pulse 94  Temp 98.2 F (36.8 C)  Resp 18  Wt 185 lb (83.915 kg)  SpO2 96%  Physical Exam  Constitutional: She is oriented to person, place, and time. She appears well-developed and well-nourished.       obese  Neck: Normal range of motion. Neck supple.  Cardiovascular: Normal rate, regular rhythm and normal heart sounds.   Pulmonary/Chest: Effort normal and breath sounds normal. No respiratory distress.  Musculoskeletal:       Bilateral knees appear normal. Full ROM but painful with bilateral knees. Right LE appears swollen. Tenderness in right ankle over bilateral malleoli, tenderness in right calf. Positive homan's sign. Bilateral knee joints stable. No erythema or signs of infection.  Neurological: She is alert and oriented to person, place, and time. She displays normal reflexes.  Skin: Skin is warm and dry.  Psychiatric: She has a normal mood and affect.    ED Course  Procedures (including critical care time)  Chronic pain for several months. Pt unable to go see her orthopedist anymore, because of change of insurance. Right LE venous doppler negaive for a dvt. No recent injuries, do not think imaging will be benificial. No signs of infection. Will d/c home with orthopedics follow up.   [place], [place] 08/22/11 1537

## 2011-08-22 NOTE — Progress Notes (Signed)
*  PRELIMINARY RESULTS* Right lower extremity venous duplex completed. No evidence of DVT, superficial thrombosis, or [name]'s cyst  [place], [place] D 08/22/2011, 2:36 PM

## 2011-08-22 NOTE — ED Notes (Signed)
Pt states "my legs hurt me, they both hurt, have been hurting since 10/13 or 14, have knee replacements on both"

## 2011-08-23 NOTE — ED Provider Notes (Signed)
Medical screening examination/treatment/procedure(s) were performed by non-physician practitioner and as supervising physician I was immediately available for consultation/collaboration.  Virgel Manifold, MD 08/23/11 [phone]

## 2011-09-03 ENCOUNTER — Encounter (HOSPITAL_COMMUNITY): Payer: Self-pay | Admitting: *Deleted

## 2011-09-03 ENCOUNTER — Emergency Department (HOSPITAL_COMMUNITY)
Admission: EM | Admit: 2011-09-03 | Discharge: 2011-09-03 | Disposition: A | Discharge status: Home / Self Care | Payer: Medicare PPO | Attending: Emergency Medicine | Admitting: Emergency Medicine

## 2011-09-03 DIAGNOSIS — M810 Age-related osteoporosis without current pathological fracture: Secondary | ICD-10-CM | POA: Insufficient documentation

## 2011-09-03 DIAGNOSIS — Z8673 Personal history of transient ischemic attack (TIA), and cerebral infarction without residual deficits: Secondary | ICD-10-CM | POA: Insufficient documentation

## 2011-09-03 DIAGNOSIS — I10 Essential (primary) hypertension: Secondary | ICD-10-CM | POA: Insufficient documentation

## 2011-09-03 DIAGNOSIS — F341 Dysthymic disorder: Secondary | ICD-10-CM | POA: Insufficient documentation

## 2011-09-03 DIAGNOSIS — Z79899 Other long term (current) drug therapy: Secondary | ICD-10-CM | POA: Insufficient documentation

## 2011-09-03 DIAGNOSIS — M069 Rheumatoid arthritis, unspecified: Secondary | ICD-10-CM | POA: Insufficient documentation

## 2011-09-03 DIAGNOSIS — Z794 Long term (current) use of insulin: Secondary | ICD-10-CM | POA: Insufficient documentation

## 2011-09-03 DIAGNOSIS — Z7982 Long term (current) use of aspirin: Secondary | ICD-10-CM | POA: Insufficient documentation

## 2011-09-03 DIAGNOSIS — K219 Gastro-esophageal reflux disease without esophagitis: Secondary | ICD-10-CM | POA: Insufficient documentation

## 2011-09-03 DIAGNOSIS — E785 Hyperlipidemia, unspecified: Secondary | ICD-10-CM | POA: Insufficient documentation

## 2011-09-03 DIAGNOSIS — R739 Hyperglycemia, unspecified: Secondary | ICD-10-CM

## 2011-09-03 DIAGNOSIS — J45909 Unspecified asthma, uncomplicated: Secondary | ICD-10-CM | POA: Insufficient documentation

## 2011-09-03 DIAGNOSIS — Z8611 Personal history of tuberculosis: Secondary | ICD-10-CM | POA: Insufficient documentation

## 2011-09-03 DIAGNOSIS — E119 Type 2 diabetes mellitus without complications: Secondary | ICD-10-CM | POA: Insufficient documentation

## 2011-09-03 LAB — DIFFERENTIAL
Basophils Absolute: 0.1 10*3/uL (ref 0.0–0.1)
Eosinophils Relative: 2 % (ref 0–5)
Lymphocytes Relative: 42 % (ref 12–46)
Monocytes Absolute: 0.7 10*3/uL (ref 0.1–1.0)
Monocytes Relative: 11 % (ref 3–12)

## 2011-09-03 LAB — CBC
HCT: 39.1 % (ref 36.0–46.0)
Hemoglobin: 12.7 g/dL (ref 12.0–15.0)
MCHC: 32.5 g/dL (ref 30.0–36.0)
MCV: 80.1 fL (ref 78.0–100.0)
RDW: 13.8 % (ref 11.5–15.5)
WBC: 6.8 10*3/uL (ref 4.0–10.5)

## 2011-09-03 NOTE — ED Provider Notes (Signed)
History     CSN: [id number] Arrival date & time: 09/03/2011  5:20 PM   First MD Initiated Contact with Patient 09/03/11 2128      Chief Complaint  Patient presents with  . Hyperglycemia    (Consider location/radiation/quality/duration/timing/severity/associated sxs/prior treatment) The history is provided by the patient and the spouse.    Patient presents to the emergency department for complaint of hyperglycemia. Patient saw her rheumatologist today and was given a right shoulder steroid injection. I told her that the injection could elevate her blood sugars. They sent her to the lab for some blood work, and a laparoscopic cholecystectomy to her just prior to arrival at the ER and told her that her blood sugar was over 500. Because of her elevated blood sugar in the lab instructed her that she needed to go to the emergency department. Her blood sugars normally run between 140 and 170. She takes oral and subcutaneous medications for her diabetes. Her primary care provider says that her A1c is now or should be and the plan to work on getting it down to baseline. Upon my arrival into the exam room, the patient had been in the emergency department waiting room for over 5 hours. The patient has no symptoms, denies any vomiting today. She states that when she checked her blood sugar yesterday he was never over 200.  Past Medical History  Diagnosis Date  . GERD (gastroesophageal reflux disease)   . Gastritis   . Stricture and stenosis of esophagus   . Obesity   . Diabetes mellitus   . Depression   . Asthma   . Hyperlipemia   . Hernia   . Hypertension   . Allergy   . Anxiety   . Arthritis     rheumatoid arithritis, DDD  . Osteoporosis   . Tuberculosis     tested positive 2011, no symptoms, was on medicine for 6 months  . TIA (transient ischemic attack)   . Plantar fasciitis     Past Surgical History  Procedure Date  . Knee surgery     Bilateral  replacement  . Cesarean section      x 2   . Breast surgery   . Joint replacement     bilat knee     Family History  Problem Relation Age of Onset  . Colon cancer Neg Hx   . Esophageal cancer Neg Hx   . Stomach cancer Neg Hx     History  Substance Use Topics  . Smoking status: Never Smoker   . Smokeless tobacco: Never Used  . Alcohol Use: No    OB History    Grav Para Term Preterm Abortions TAB SAB Ect Mult Living                  Review of Systems  All other systems reviewed and are negative.    Allergies  Review of patient's allergies indicates no known allergies.  Home Medications   Current Outpatient Rx  Name Route Sig Dispense Refill  . AMLODIPINE BESYLATE 5 MG PO TABS Oral Take 5 mg by mouth daily.     . ASPIRIN 81 MG PO TABS Oral Take 81 mg by mouth daily.      Marland Kitchen CALCIUM CARBONATE 600 MG PO TABS Oral Take 600 mg by mouth daily.      Marland Kitchen CLOTRIMAZOLE-BETAMETHASONE 1-0.05 % EX LOTN Topical Apply 1 application topically 2 (two) times daily. Psoriasis/.    . CYMBALTA 60 MG PO CPEP  One by mouth once daily     . ESOMEPRAZOLE MAGNESIUM 40 MG PO CPDR Oral Take 1 capsule (40 mg total) by mouth 2 (two) times daily. 60 capsule 2  . FOLIC ACID 1 MG PO TABS Oral Take 1 mg by mouth daily.      Marland Kitchen GLIMEPIRIDE 4 MG PO TABS  One tablet by mouth once daily     . VICTOZA [place] Subcutaneous Inject 1.8 Units into the skin 2 (two) times daily.      Marland Kitchen LORAZEPAM 0.5 MG PO TABS Oral Take 0.5 mg by mouth every 6 (six) hours as needed. As needed anxiety.    Marland Kitchen LOSARTAN POTASSIUM 100 MG PO TABS  One by mouth once daily     . METFORMIN HCL 1000 MG PO TABS  One tablet by mouth twice a day     . VYTORIN 10-40 MG PO TABS  One by mouth once daily      BP 107/79  Pulse 108  Temp(Src) 98 F (36.7 C) (Oral)  Resp 22  SpO2 96%  Physical Exam  Constitutional: She appears well-developed and well-nourished.  HENT:  Head: Normocephalic and atraumatic.  Eyes: Conjunctivae are normal. Pupils are equal, round, and reactive to  light.  Neck: Trachea normal, normal range of motion and full passive range of motion without pain. Neck supple.  Cardiovascular: Normal rate, regular rhythm and normal pulses.   Pulmonary/Chest: Effort normal and breath sounds normal. Chest wall is not dull to percussion. She exhibits no tenderness, no crepitus, no edema, no deformity and no retraction.  Abdominal: Soft. Normal appearance and bowel sounds are normal.  Musculoskeletal: Normal range of motion.  Lymphadenopathy:       Head (right side): No submental, no submandibular, no tonsillar, no preauricular, no posterior auricular and no occipital adenopathy present.       Head (left side): No submental, no submandibular, no tonsillar, no preauricular, no posterior auricular and no occipital adenopathy present.    She has no cervical adenopathy.    She has no axillary adenopathy.  Neurological: She is alert. She has normal strength.  Skin: Skin is warm, dry and intact.  Psychiatric: She has a normal mood and affect. Her speech is normal and behavior is normal. Judgment and thought content normal. Cognition and memory are normal.    ED Course  Procedures (including critical care time)  Labs Reviewed  GLUCOSE, CAPILLARY - Abnormal; Notable for the following:    Glucose-Capillary 209 (*)    All other components within normal limits  CBC  DIFFERENTIAL  I-STAT, CHEM 8  POCT CBG MONITORING  POCT CBG MONITORING   No results found.   1. Hyperglycemia       MDM  The patient's blood sugar was checked after my examination, the patient had been in the waiting room for 5 hours before my examination. By the time I saw the patient the patient's blood sugar was 209. Patient had no symptoms CBC was normal patient requested to leave she was tired of waiting. Therefore I told the patient to call her rheumatologist and inform him him of the visit today as well call the primary care provider and let him know about the incident from  today.Linus Mako, [place] 09/03/11 2322

## 2011-09-03 NOTE — ED Notes (Signed)
Her glucose has been high for 2 months.  She hashad a recent weight loss since her mother died 6 weeks ago.  She had her labd drawn in her doctors office earlier today and they called told her her bld sugar was high and to come to the ed.  She has had some vomiting.   Alert no distress

## 2011-09-03 NOTE — ED Provider Notes (Signed)
Medical screening examination/treatment/procedure(s) were performed by non-physician practitioner and as supervising physician I was immediately available for consultation/collaboration.   Maudry Diego, MD 09/03/11 [phone]

## 2011-09-03 NOTE — ED Notes (Signed)
Pt st's she was sent here due to CBG > 500  St's she has vomited x's 3 today.  Also st's she has had a 40lb wt loss in past 4 months.

## 2012-03-13 ENCOUNTER — Encounter (HOSPITAL_COMMUNITY): Payer: Self-pay | Admitting: Emergency Medicine

## 2012-03-13 ENCOUNTER — Emergency Department (HOSPITAL_COMMUNITY)
Admission: EM | Admit: 2012-03-13 | Discharge: 2012-03-13 | Disposition: A | Discharge status: Home / Self Care | Payer: Medicare PPO | Attending: Emergency Medicine | Admitting: Emergency Medicine

## 2012-03-13 DIAGNOSIS — Z8673 Personal history of transient ischemic attack (TIA), and cerebral infarction without residual deficits: Secondary | ICD-10-CM | POA: Insufficient documentation

## 2012-03-13 DIAGNOSIS — K219 Gastro-esophageal reflux disease without esophagitis: Secondary | ICD-10-CM | POA: Insufficient documentation

## 2012-03-13 DIAGNOSIS — M129 Arthropathy, unspecified: Secondary | ICD-10-CM | POA: Insufficient documentation

## 2012-03-13 DIAGNOSIS — I10 Essential (primary) hypertension: Secondary | ICD-10-CM | POA: Insufficient documentation

## 2012-03-13 DIAGNOSIS — R21 Rash and other nonspecific skin eruption: Secondary | ICD-10-CM

## 2012-03-13 DIAGNOSIS — F341 Dysthymic disorder: Secondary | ICD-10-CM | POA: Insufficient documentation

## 2012-03-13 DIAGNOSIS — J45909 Unspecified asthma, uncomplicated: Secondary | ICD-10-CM | POA: Insufficient documentation

## 2012-03-13 DIAGNOSIS — E785 Hyperlipidemia, unspecified: Secondary | ICD-10-CM | POA: Insufficient documentation

## 2012-03-13 DIAGNOSIS — L298 Other pruritus: Secondary | ICD-10-CM | POA: Insufficient documentation

## 2012-03-13 DIAGNOSIS — E119 Type 2 diabetes mellitus without complications: Secondary | ICD-10-CM | POA: Insufficient documentation

## 2012-03-13 DIAGNOSIS — M810 Age-related osteoporosis without current pathological fracture: Secondary | ICD-10-CM | POA: Insufficient documentation

## 2012-03-13 DIAGNOSIS — L2989 Other pruritus: Secondary | ICD-10-CM | POA: Insufficient documentation

## 2012-03-13 DIAGNOSIS — Z79899 Other long term (current) drug therapy: Secondary | ICD-10-CM | POA: Insufficient documentation

## 2012-03-13 DIAGNOSIS — Z794 Long term (current) use of insulin: Secondary | ICD-10-CM | POA: Insufficient documentation

## 2012-03-13 DIAGNOSIS — Z7982 Long term (current) use of aspirin: Secondary | ICD-10-CM | POA: Insufficient documentation

## 2012-03-13 MED ORDER — DEXAMETHASONE SODIUM PHOSPHATE 10 MG/ML IJ SOLN
10.0000 mg | Freq: Once | INTRAMUSCULAR | Status: AC
  Administered 2012-03-13: 10 mg via INTRAVENOUS
  Filled 2012-03-13: qty 1

## 2012-03-13 MED ORDER — HYDROXYZINE HCL 25 MG PO TABS: 25.0000 mg | ORAL_TABLET | Freq: Four times a day (QID) | ORAL | Status: AC

## 2012-03-13 MED ORDER — PREDNISONE 20 MG PO TABS: 60.0000 mg | ORAL_TABLET | Freq: Every day | ORAL | Status: AC

## 2012-03-13 NOTE — ED Notes (Signed)
Patient discharge via ambulatory with steady gait. Respirations equal and unlabored. Skin warm and dry. No acute distress noted.

## 2012-03-13 NOTE — Discharge Instructions (Signed)
Rash A rash is a change in the color or texture of your skin. There are many different types of rashes. You may have other problems that accompany your rash. CAUSES   Infections.   Allergic reactions. This can include allergies to pets or foods.   Certain medicines.   Exposure to certain chemicals, soaps, or cosmetics.   Heat.   Exposure to poisonous plants.   Tumors, both cancerous and noncancerous.  SYMPTOMS   Redness.   Scaly skin.   Itchy skin.   Dry or cracked skin.   Bumps.   Blisters.   Pain.  DIAGNOSIS  Your caregiver may do a physical exam to determine what type of rash you have. A skin sample (biopsy) may be taken and examined under a microscope. TREATMENT  Treatment depends on the type of rash you have. Your caregiver may prescribe certain medicines. For serious conditions, you may need to see a skin doctor (dermatologist). HOME CARE INSTRUCTIONS   Avoid the substance that caused your rash.   Do not scratch your rash. This can cause infection.   You may take cool baths to help stop itching.   Only take over-the-counter or prescription medicines as directed by your caregiver.   Keep all follow-up appointments as directed by your caregiver.  SEEK IMMEDIATE MEDICAL CARE IF:  You have increasing pain, swelling, or redness.   You have a fever.   You have new or severe symptoms.   You have body aches, diarrhea, or vomiting.   Your rash is not better after 3 days.  MAKE SURE YOU:  Understand these instructions.   Will watch your condition.   Will get help right away if you are not doing well or get worse.  Document Released: 09/21/2002 Document Revised: 09/20/2011 Document Reviewed: 07/16/2011 [facility] Patient Information 2012 [place].

## 2012-03-13 NOTE — ED Provider Notes (Signed)
History     CSN: [id number]  Arrival date & time 03/13/12  2111      10:12 PM HPI Patient reports she was at a baseball field when suddenly developed hives on her lower legs and arms. Is extremely itchy. Denies known allergies. Denies change in lotions, detergents, soaps. Denies chest pain, shortness of breath, wheezing, irritation, swelling  Patient is a 56 y.o. female presenting with rash.  Rash  This is a new problem. The current episode started more than 1 week ago. The problem has been gradually worsening. The problem is associated with an unknown factor. There has been no fever. The rash is present on the right lower leg. The pain is severe. The pain has been constant since onset. Associated symptoms include blisters, pain and weeping. The treatment provided no relief.    Past Medical History  Diagnosis Date  . GERD (gastroesophageal reflux disease)   . Gastritis   . Stricture and stenosis of esophagus   . Obesity   . Diabetes mellitus   . Depression   . Asthma   . Hyperlipemia   . Hernia   . Hypertension   . Allergy   . Anxiety   . Arthritis     rheumatoid arithritis, DDD  . Osteoporosis   . Tuberculosis     tested positive 2011, no symptoms, was on medicine for 6 months  . TIA (transient ischemic attack)   . Plantar fasciitis     Past Surgical History  Procedure Date  . Knee surgery     Bilateral  replacement  . Cesarean section     x 2   . Breast surgery   . Joint replacement     bilat knee     Family History  Problem Relation Age of Onset  . Colon cancer Neg Hx   . Esophageal cancer Neg Hx   . Stomach cancer Neg Hx     History  Substance Use Topics  . Smoking status: Never Smoker   . Smokeless tobacco: Never Used  . Alcohol Use: No    OB History    Grav Para Term Preterm Abortions TAB SAB Ect Mult Living                  Review of Systems  Constitutional: Negative for fever and chills.  HENT: Negative for rhinorrhea.   Eyes: Negative  for redness.  Respiratory: Negative for cough, shortness of breath and wheezing.   Cardiovascular: Negative for chest pain and palpitations.  Gastrointestinal: Negative for nausea.  Skin: Positive for rash.       Pruritus  All other systems reviewed and are negative.    Allergies  Review of patient's allergies indicates no known allergies.  Home Medications   Current Outpatient Rx  Name Route Sig Dispense Refill  . ADALIMUMAB 40 MG/0.8ML [place] KIT Subcutaneous Inject 40 mg into the skin once a week.    . ASPIRIN 81 MG PO TABS Oral Take 81 mg by mouth daily.      Marland Kitchen CALCIUM CARBONATE-VITAMIN D 500-200 MG-UNIT PO TABS Oral Take 1 tablet by mouth daily.    . CYMBALTA 60 MG PO CPEP  One by mouth once daily     . ERGOCALCIFEROL 50000 UNITS PO CAPS Oral Take 50,000 Units by mouth once a week.    . ESOMEPRAZOLE MAGNESIUM 40 MG PO CPDR Oral Take 1 capsule (40 mg total) by mouth 2 (two) times daily. 60 capsule 2  . EZETIMIBE-SIMVASTATIN 10-40 MG  PO TABS Oral Take 1 tablet by mouth at bedtime.    Marland Kitchen FOLIC ACID 1 MG PO TABS Oral Take 2 mg by mouth daily.     Marland Kitchen GLIMEPIRIDE 4 MG PO TABS  One tablet by mouth once daily     . INSULIN GLARGINE 100 UNIT/ML [place] SOLN Subcutaneous Inject 30 Units into the skin at bedtime.    Marland Kitchen METFORMIN HCL 1000 MG PO TABS Oral Take 1,000 mg by mouth 2 (two) times daily with a meal.     . METHOTREXATE SODIUM 5 MG PO TABS Oral Take 50 mg by mouth once a week. Caution: Chemotherapy. Protect from light.      There were no vitals taken for this visit.  Physical Exam  Vitals reviewed. Constitutional: She is oriented to person, place, and time. Vital signs are normal. She appears well-developed and well-nourished. No distress.  HENT:  Head: Normocephalic and atraumatic.  Mouth/Throat: Oropharynx is clear and moist. No oropharyngeal exudate.  Eyes: Pupils are equal, round, and reactive to light.  Neck: Neck supple.  Cardiovascular: Normal rate, regular rhythm and normal  heart sounds.  Exam reveals no gallop and no friction rub.   No murmur heard. Pulmonary/Chest: Effort normal and breath sounds normal. She has no wheezes. She has no rales. She exhibits no tenderness.  Neurological: She is alert and oriented to person, place, and time.  Skin: Skin is warm and dry. Rash ( Small erythematous macular lesions on bilateral lower and upper extremities. Spares the palms and soles. Also has multiple excoriations on bilateral lower extremities. ) noted. No erythema. No pallor.  Psychiatric: She has a normal mood and affect. Her behavior is normal.    ED Course  Procedures   MDM  Unknown cause a rash but I have given patient hydroxyzine and steroids. Advised followup with dermatologist should rash continue. Advised cool compresses and oatmeal baths for relief as well. Patient voices understanding and understands return to the emergency department for worsening symptoms such as shortness of breath, swelling, wheezing.        Sheliah Mends, PA-C 03/13/12 2255

## 2012-03-13 NOTE — ED Notes (Signed)
Patient complains of itchy rash all over body.

## 2012-03-20 ENCOUNTER — Encounter (HOSPITAL_COMMUNITY): Payer: Self-pay

## 2012-03-20 ENCOUNTER — Encounter (HOSPITAL_COMMUNITY)
Admission: RE | Admit: 2012-03-20 | Discharge: 2012-03-20 | Disposition: A | Discharge status: Home / Self Care | Payer: Medicare PPO | Source: Ambulatory Visit | Attending: Rheumatology | Admitting: Rheumatology

## 2012-03-20 DIAGNOSIS — M810 Age-related osteoporosis without current pathological fracture: Secondary | ICD-10-CM | POA: Insufficient documentation

## 2012-03-20 MED ORDER — ZOLEDRONIC ACID 5 MG/100ML IV SOLN
5.0000 mg | Freq: Once | INTRAVENOUS | Status: AC
  Administered 2012-03-20: 5 mg via INTRAVENOUS
  Filled 2012-03-20: qty 100

## 2012-03-20 MED ORDER — SODIUM CHLORIDE 0.9 % IV SOLN
Freq: Once | INTRAVENOUS | Status: AC
  Administered 2012-03-20: 250 mL via INTRAVENOUS

## 2012-03-20 NOTE — Discharge Instructions (Signed)
Drink fluids/water as tolerated over next 72hrs Tylenol or Ibuprofen OTC as directed Continue calcium and Vit D as directed by your MD

## 2012-03-20 NOTE — ED Provider Notes (Signed)
Medical screening examination/treatment/procedure(s) were performed by non-physician practitioner and as supervising physician I was immediately available for consultation/collaboration.  Virgel Manifold, MD 03/20/12 [phone]

## 2012-06-15 ENCOUNTER — Encounter (HOSPITAL_COMMUNITY): Payer: Self-pay | Admitting: Emergency Medicine

## 2012-06-15 ENCOUNTER — Emergency Department (HOSPITAL_COMMUNITY)
Admission: EM | Admit: 2012-06-15 | Discharge: 2012-06-15 | Disposition: A | Discharge status: Home / Self Care | Payer: Medicare PPO | Attending: Emergency Medicine | Admitting: Emergency Medicine

## 2012-06-15 DIAGNOSIS — I10 Essential (primary) hypertension: Secondary | ICD-10-CM | POA: Insufficient documentation

## 2012-06-15 DIAGNOSIS — Z8739 Personal history of other diseases of the musculoskeletal system and connective tissue: Secondary | ICD-10-CM | POA: Insufficient documentation

## 2012-06-15 DIAGNOSIS — Z79899 Other long term (current) drug therapy: Secondary | ICD-10-CM | POA: Insufficient documentation

## 2012-06-15 DIAGNOSIS — E860 Dehydration: Secondary | ICD-10-CM | POA: Insufficient documentation

## 2012-06-15 DIAGNOSIS — R5381 Other malaise: Secondary | ICD-10-CM | POA: Insufficient documentation

## 2012-06-15 DIAGNOSIS — E119 Type 2 diabetes mellitus without complications: Secondary | ICD-10-CM | POA: Insufficient documentation

## 2012-06-15 DIAGNOSIS — J45909 Unspecified asthma, uncomplicated: Secondary | ICD-10-CM | POA: Insufficient documentation

## 2012-06-15 DIAGNOSIS — R5383 Other fatigue: Secondary | ICD-10-CM | POA: Insufficient documentation

## 2012-06-15 DIAGNOSIS — E669 Obesity, unspecified: Secondary | ICD-10-CM | POA: Insufficient documentation

## 2012-06-15 DIAGNOSIS — Z8673 Personal history of transient ischemic attack (TIA), and cerebral infarction without residual deficits: Secondary | ICD-10-CM | POA: Insufficient documentation

## 2012-06-15 DIAGNOSIS — K219 Gastro-esophageal reflux disease without esophagitis: Secondary | ICD-10-CM | POA: Insufficient documentation

## 2012-06-15 LAB — POCT I-STAT TROPONIN I
Troponin i, poc: 0 ng/mL (ref 0.00–0.08)
Troponin i, poc: 0 ng/mL (ref 0.00–0.08)

## 2012-06-15 LAB — CBC WITH DIFFERENTIAL/PLATELET
Eosinophils Relative: 2 % (ref 0–5)
HCT: 41.7 % (ref 36.0–46.0)
Lymphocytes Relative: 46 % (ref 12–46)
Lymphs Abs: 4.6 10*3/uL — ABNORMAL HIGH (ref 0.7–4.0)
MCV: 83.1 fL (ref 78.0–100.0)
Monocytes Absolute: 0.3 10*3/uL (ref 0.1–1.0)
RBC: 5.02 MIL/uL (ref 3.87–5.11)
WBC: 10 10*3/uL (ref 4.0–10.5)

## 2012-06-15 LAB — BASIC METABOLIC PANEL
CO2: 23 mEq/L (ref 19–32)
Calcium: 9.6 mg/dL (ref 8.4–10.5)
Creatinine, Ser: 0.81 mg/dL (ref 0.50–1.10)
Glucose, Bld: 310 mg/dL — ABNORMAL HIGH (ref 70–99)

## 2012-06-15 LAB — GLUCOSE, CAPILLARY
Glucose-Capillary: 339 mg/dL — ABNORMAL HIGH (ref 70–99)
Glucose-Capillary: 263 mg/dL — ABNORMAL HIGH (ref 70–99)

## 2012-06-15 LAB — URINE MICROSCOPIC-ADD ON

## 2012-06-15 LAB — BLOOD GAS, VENOUS
Acid-base deficit: 0.5 mmol/L (ref 0.0–2.0)
pCO2, Ven: 25.8 mmHg — ABNORMAL LOW (ref 45.0–50.0)
pO2, Ven: 173 mmHg — ABNORMAL HIGH (ref 30.0–45.0)

## 2012-06-15 LAB — MAGNESIUM: Magnesium: 1.7 mg/dL (ref 1.5–2.5)

## 2012-06-15 LAB — CALCIUM: Calcium: 8.7 mg/dL (ref 8.4–10.5)

## 2012-06-15 LAB — URINALYSIS, ROUTINE W REFLEX MICROSCOPIC
Glucose, UA: >1000 mg/dL — AB
Protein, ur: NEGATIVE mg/dL

## 2012-06-15 MED ORDER — SODIUM CHLORIDE 0.9 % IV BOLUS (SEPSIS)
1000.0000 mL | Freq: Once | INTRAVENOUS | Status: AC
  Administered 2012-06-15: 1000 mL via INTRAVENOUS

## 2012-06-15 MED ORDER — SODIUM CHLORIDE 0.9 % IV BOLUS (SEPSIS): 1000.0000 mL | Freq: Once | INTRAVENOUS | Status: DC

## 2012-06-15 MED ORDER — ONDANSETRON HCL 4 MG/2ML IJ SOLN
4.0000 mg | Freq: Once | INTRAMUSCULAR | Status: AC
  Administered 2012-06-15: 4 mg via INTRAVENOUS

## 2012-06-15 MED ORDER — ONDANSETRON HCL 4 MG/2ML IJ SOLN: 4.0000 mg | Freq: Once | INTRAMUSCULAR | Status: DC

## 2012-06-15 NOTE — ED Notes (Signed)
EKG printed and given to [place] for review. Last confirmed EKG printed for comparison

## 2012-06-15 NOTE — ED Notes (Signed)
Pt presents w/ weakness, heart racing, shakey feeling, nausea, cold sweats onset 1400 today. Pt states had similar episode on Thursday but resolved on its own.

## 2012-06-15 NOTE — ED Provider Notes (Signed)
See prior note   Janice Norrie, MD 06/15/12 2116

## 2012-06-15 NOTE — ED Provider Notes (Signed)
Patient reports that 3 days ago she had an episode for a few hours and she felt like her heart was racing. She relates it returned today and she has felt like her heart is racing all day. She feels weak with nausea and sometimes breaks out in a cold sweat however she denies any chest pain. She states she feels like she has lack of energy.  Patient has slightly flushed face. Her color is otherwise normal. She does not appear to be in any distress.  Medical screening examination/treatment/procedure(s) were conducted as a shared visit with non-physician practitioner(s) and myself.  I personally evaluated the patient during the encounter  Rolland Porter, MD, Alanson Aly, MD 06/15/12 2009

## 2012-06-15 NOTE — ED Notes (Signed)
Labs being drawn

## 2012-06-15 NOTE — ED Provider Notes (Signed)
History     CSN: [id number]  Arrival date & time 06/15/12  1506   First MD Initiated Contact with Patient 06/15/12 1555      Chief Complaint  Patient presents with  . Weakness  . Tachycardia    (Consider location/radiation/quality/duration/timing/severity/associated sxs/prior treatment) HPI  56 y.o. diabetic female in no acute distress complaining of acute onset of weakness and palpitations at 2 PM today. Patient reports patient being hot and cold chills and nausea. She denies any chest pain, abdominal pain, vomiting, vertigo, polyuria, polydipsia or hunger.    Past Medical History  Diagnosis Date  . GERD (gastroesophageal reflux disease)   . Gastritis   . Stricture and stenosis of esophagus   . Obesity   . Diabetes mellitus   . Depression   . Asthma   . Hyperlipemia   . Hernia   . Hypertension   . Allergy   . Anxiety   . Arthritis     rheumatoid arithritis, DDD  . Osteoporosis   . Tuberculosis     tested positive 2011, no symptoms, was on medicine for 6 months  . TIA (transient ischemic attack)   . Plantar fasciitis     Past Surgical History  Procedure Date  . Knee surgery     Bilateral  replacement  . Cesarean section     x 2   . Breast surgery   . Joint replacement     bilat knee     Family History  Problem Relation Age of Onset  . Colon cancer Neg Hx   . Esophageal cancer Neg Hx   . Stomach cancer Neg Hx     History  Substance Use Topics  . Smoking status: Never Smoker   . Smokeless tobacco: Never Used  . Alcohol Use: No    OB History    Grav Para Term Preterm Abortions TAB SAB Ect Mult Living                  Review of Systems  Constitutional: Negative for fever.  Respiratory: Negative for shortness of breath.   Cardiovascular: Positive for palpitations. Negative for chest pain.  Gastrointestinal: Positive for nausea. Negative for vomiting, abdominal pain and diarrhea.  All other systems reviewed and are negative.    Allergies    Review of patient's allergies indicates no known allergies.  Home Medications   Current Outpatient Rx  Name Route Sig Dispense Refill  . ADALIMUMAB 40 MG/0.8ML [place] KIT Subcutaneous Inject 40 mg into the skin once a week.    . ASPIRIN 81 MG PO TABS Oral Take 81 mg by mouth daily.      Marland Kitchen CALCIUM CARBONATE-VITAMIN D 500-200 MG-UNIT PO TABS Oral Take 1 tablet by mouth daily.    . CYMBALTA 60 MG PO CPEP  One by mouth once daily     . ERGOCALCIFEROL 50000 UNITS PO CAPS Oral Take 50,000 Units by mouth once a week.    . ESOMEPRAZOLE MAGNESIUM 40 MG PO CPDR Oral Take 1 capsule (40 mg total) by mouth 2 (two) times daily. 60 capsule 2  . EZETIMIBE-SIMVASTATIN 10-40 MG PO TABS Oral Take 1 tablet by mouth at bedtime.    Marland Kitchen FOLIC ACID 1 MG PO TABS Oral Take 2 mg by mouth daily.     Marland Kitchen GLIMEPIRIDE 4 MG PO TABS  One tablet by mouth once daily     . INSULIN GLARGINE 100 UNIT/ML [place] SOLN Subcutaneous Inject 30 Units into the skin at bedtime.    Marland Kitchen  METFORMIN HCL 1000 MG PO TABS Oral Take 1,000 mg by mouth 2 (two) times daily with a meal.     . METHOTREXATE SODIUM 5 MG PO TABS Oral Take 50 mg by mouth once a week. Caution: Chemotherapy. Protect from light.      BP 115/85  Pulse 134  Temp 97.9 F (36.6 C) (Oral)  Resp 20  SpO2 94%  Physical Exam  Nursing note and vitals reviewed. Constitutional: She is oriented to person, place, and time. She appears well-developed and well-nourished. No distress.       Obese  HENT:  Head: Normocephalic and atraumatic.  Mouth/Throat: Oropharynx is clear and moist.  Eyes: Conjunctivae and EOM are normal. Pupils are equal, round, and reactive to light.  Neck: Normal range of motion. No JVD present.  Cardiovascular: Regular rhythm and normal heart sounds.        Patient heart rate is about 100 at rest.  Pulmonary/Chest: Effort normal and breath sounds normal. No respiratory distress. She has no wheezes. She has no rales. She exhibits no tenderness.  Abdominal: Soft.  Bowel sounds are normal. She exhibits no distension and no mass. There is no tenderness. There is no rebound and no guarding.       Large reducible ventral hernia in the epigastric area  Genitourinary:       No CVA tenderness bilaterally,   Musculoskeletal: Normal range of motion.  Neurological: She is alert and oriented to person, place, and time.  Skin: Skin is warm and dry.  Psychiatric: She has a normal mood and affect.    ED Course  Procedures (including critical care time)  Labs Reviewed  GLUCOSE, CAPILLARY - Abnormal; Notable for the following:    Glucose-Capillary 339 (*)     All other components within normal limits  CBC WITH DIFFERENTIAL - Abnormal; Notable for the following:    Lymphs Abs 4.6 (*)     All other components within normal limits  BASIC METABOLIC PANEL - Abnormal; Notable for the following:    Glucose, Bld 310 (*)     GFR calc non Af Amer 81 (*)     All other components within normal limits  URINALYSIS, ROUTINE W REFLEX MICROSCOPIC - Abnormal; Notable for the following:    APPearance CLOUDY (*)     Specific Gravity, Urine 1.031 (*)     Glucose, UA >1000 (*)     Hgb urine dipstick LARGE (*)     Bilirubin Urine SMALL (*)     Ketones, ur TRACE (*)     Leukocytes, UA SMALL (*)     All other components within normal limits  BLOOD GAS, VENOUS - Abnormal; Notable for the following:    pH, Ven 7.514 (*)     pCO2, Ven 25.8 (*)     pO2, Ven 173.0 (*)     All other components within normal limits  URINE MICROSCOPIC-ADD ON - Abnormal; Notable for the following:    Squamous Epithelial / LPF FEW (*)     Crystals CA OXALATE CRYSTALS (*)     All other components within normal limits  GLUCOSE, CAPILLARY - Abnormal; Notable for the following:    Glucose-Capillary 263 (*)     All other components within normal limits  POCT I-STAT TROPONIN I  OCCULT BLOOD, POC DEVICE  MAGNESIUM  CALCIUM  POCT I-STAT TROPONIN I  OCCULT BLOOD X 1 CARD TO LAB, STOOL  TSH  T4   No  results found.   Date: 06/15/2012  16:38  Rate: 100  Rhythm: normal sinus rhythm  QRS Axis: normal  Intervals: normal  ST/T Wave abnormalities: normal  Conduction Disutrbances:none  Narrative Interpretation:   Old EKG Reviewed: unchanged   Date: 06/15/2012 19:33  Rate: 97  Rhythm: normal sinus rhythm  QRS Axis: normal  Intervals: normal  ST/T Wave abnormalities: normal  Conduction Disutrbances:none  Narrative Interpretation:   Old EKG Reviewed: unchanged   1. Dehydration       MDM  It should presenting 339 this is not inconsistent with her prior blood glucose readings. Patient denies any polyuria or Polydipsia  Patient found to be orthostatic. She also shows mild metabolic alkalosis likely secondary to contraction. Cause of volume loss is unclear. Patient does drink a lot of tea and soda is and she may have an osmotic diuresis from high blood glucose levels.  Urinalysis shows a significant amount of hemoglobin. Patient is postmenopausal x4 years   Pt now reports dark stool x4 days, however guaiac is negative.  2nd L Bolus given, Ca and Magnesium will be ordered. TSH and T4 will be ordered for her PCP to follow up.   Calcium and magnesium are within normal limits. Patient has received 2 L of hydration and pulse rate has reduced to 90.   Shared Visit with Attending Dr. Tomi Bamberger.     Pt verbalized understanding and agrees with care plan. Outpatient follow-up and return precautions given.      Monico Blitz, PA-C 06/15/12 2112

## 2012-06-16 LAB — T4: T4, Total: 11 ug/dL (ref 5.0–12.5)

## 2012-06-16 LAB — TSH: TSH: 1.107 u[IU]/mL (ref 0.350–4.500)

## 2012-10-16 ENCOUNTER — Emergency Department (HOSPITAL_COMMUNITY): Payer: Medicare PPO

## 2012-10-16 ENCOUNTER — Emergency Department (HOSPITAL_COMMUNITY)
Admission: EM | Admit: 2012-10-16 | Discharge: 2012-10-16 | Disposition: A | Discharge status: Home / Self Care | Payer: Medicare PPO | Attending: Emergency Medicine | Admitting: Emergency Medicine

## 2012-10-16 ENCOUNTER — Encounter (HOSPITAL_COMMUNITY): Payer: Self-pay | Admitting: Family Medicine

## 2012-10-16 DIAGNOSIS — F329 Major depressive disorder, single episode, unspecified: Secondary | ICD-10-CM | POA: Insufficient documentation

## 2012-10-16 DIAGNOSIS — Z8611 Personal history of tuberculosis: Secondary | ICD-10-CM | POA: Insufficient documentation

## 2012-10-16 DIAGNOSIS — F411 Generalized anxiety disorder: Secondary | ICD-10-CM | POA: Insufficient documentation

## 2012-10-16 DIAGNOSIS — R0602 Shortness of breath: Secondary | ICD-10-CM | POA: Insufficient documentation

## 2012-10-16 DIAGNOSIS — R062 Wheezing: Secondary | ICD-10-CM | POA: Insufficient documentation

## 2012-10-16 DIAGNOSIS — Z9109 Other allergy status, other than to drugs and biological substances: Secondary | ICD-10-CM | POA: Insufficient documentation

## 2012-10-16 DIAGNOSIS — K219 Gastro-esophageal reflux disease without esophagitis: Secondary | ICD-10-CM | POA: Insufficient documentation

## 2012-10-16 DIAGNOSIS — Z79899 Other long term (current) drug therapy: Secondary | ICD-10-CM | POA: Insufficient documentation

## 2012-10-16 DIAGNOSIS — I10 Essential (primary) hypertension: Secondary | ICD-10-CM | POA: Insufficient documentation

## 2012-10-16 DIAGNOSIS — Z8719 Personal history of other diseases of the digestive system: Secondary | ICD-10-CM | POA: Insufficient documentation

## 2012-10-16 DIAGNOSIS — F3289 Other specified depressive episodes: Secondary | ICD-10-CM | POA: Insufficient documentation

## 2012-10-16 DIAGNOSIS — R0989 Other specified symptoms and signs involving the circulatory and respiratory systems: Secondary | ICD-10-CM | POA: Insufficient documentation

## 2012-10-16 DIAGNOSIS — R0609 Other forms of dyspnea: Secondary | ICD-10-CM | POA: Insufficient documentation

## 2012-10-16 DIAGNOSIS — J45909 Unspecified asthma, uncomplicated: Secondary | ICD-10-CM | POA: Insufficient documentation

## 2012-10-16 DIAGNOSIS — R059 Cough, unspecified: Secondary | ICD-10-CM | POA: Insufficient documentation

## 2012-10-16 DIAGNOSIS — Z7982 Long term (current) use of aspirin: Secondary | ICD-10-CM | POA: Insufficient documentation

## 2012-10-16 DIAGNOSIS — Z794 Long term (current) use of insulin: Secondary | ICD-10-CM | POA: Insufficient documentation

## 2012-10-16 DIAGNOSIS — R0789 Other chest pain: Secondary | ICD-10-CM | POA: Insufficient documentation

## 2012-10-16 DIAGNOSIS — R05 Cough: Secondary | ICD-10-CM

## 2012-10-16 DIAGNOSIS — Z8673 Personal history of transient ischemic attack (TIA), and cerebral infarction without residual deficits: Secondary | ICD-10-CM | POA: Insufficient documentation

## 2012-10-16 DIAGNOSIS — E119 Type 2 diabetes mellitus without complications: Secondary | ICD-10-CM | POA: Insufficient documentation

## 2012-10-16 DIAGNOSIS — Z8739 Personal history of other diseases of the musculoskeletal system and connective tissue: Secondary | ICD-10-CM | POA: Insufficient documentation

## 2012-10-16 DIAGNOSIS — E669 Obesity, unspecified: Secondary | ICD-10-CM | POA: Insufficient documentation

## 2012-10-16 DIAGNOSIS — E785 Hyperlipidemia, unspecified: Secondary | ICD-10-CM | POA: Insufficient documentation

## 2012-10-16 LAB — TROPONIN I: Troponin I: <0.3 ng/mL (ref <0.30)

## 2012-10-16 MED ORDER — PREDNISONE 20 MG PO TABS
60.0000 mg | ORAL_TABLET | Freq: Once | ORAL | Status: AC
  Administered 2012-10-16: 60 mg via ORAL
  Filled 2012-10-16: qty 3

## 2012-10-16 MED ORDER — HYDROCOD POLST-CHLORPHEN POLST 10-8 MG/5ML PO LQCR: 5.0000 mL | Freq: Two times a day (BID) | ORAL | Status: DC | PRN

## 2012-10-16 MED ORDER — OXYCODONE-ACETAMINOPHEN 5-325 MG PO TABS
1.0000 | ORAL_TABLET | Freq: Once | ORAL | Status: AC
  Administered 2012-10-16: 1 via ORAL
  Filled 2012-10-16: qty 1

## 2012-10-16 MED ORDER — IPRATROPIUM BROMIDE 0.02 % IN SOLN
0.5000 mg | Freq: Once | RESPIRATORY_TRACT | Status: AC
  Administered 2012-10-16: 0.5 mg via RESPIRATORY_TRACT
  Filled 2012-10-16: qty 2.5

## 2012-10-16 MED ORDER — ALBUTEROL SULFATE (5 MG/ML) 0.5% IN NEBU
5.0000 mg | INHALATION_SOLUTION | Freq: Once | RESPIRATORY_TRACT | Status: AC
  Administered 2012-10-16: 5 mg via RESPIRATORY_TRACT
  Filled 2012-10-16: qty 1

## 2012-10-16 MED ORDER — PREDNISONE 20 MG PO TABS: 20.0000 mg | ORAL_TABLET | Freq: Every day | ORAL | Status: DC

## 2012-10-16 NOTE — ED Provider Notes (Signed)
Medical screening examination/treatment/procedure(s) were performed by non-physician practitioner and as supervising physician I was immediately available for consultation/collaboration.   Wynetta Fines, MD 10/16/12 2239

## 2012-10-16 NOTE — ED Notes (Signed)
Report received-airway intact, no s/s's of distress-will continue to monitor

## 2012-10-16 NOTE — ED Notes (Addendum)
Patient states that she was diagnosed with bronchitis on December 4th; was treated with Erythromycin and steroids. States wheezing decreased after treatment but cough and wheezing returned a week ago and have gotten progressively worse. States cough is dry and non-productive. Reports shortness of breath with ambulation. States she started having chest pain this evening.

## 2012-10-16 NOTE — ED Provider Notes (Signed)
History     CSN: [id number]  Arrival date & time 10/16/12  0234   First MD Initiated Contact with Patient 10/16/12 0445      Chief Complaint  Patient presents with  . Cough    (Consider location/radiation/quality/duration/timing/severity/associated sxs/prior treatment) HPI History provided by pt.   Pt reports that she has had a chronic cough since mid-November, 2013.  Was evaluated by her PCP on 12/4, diagnosed w/ bronchitis, and treated w/ Zpak, tessalon perles, prednisone, albuterol neb and inhaler.  The cough is relieved temporarily by the albuterol, but has not resolved and limiting her ability to sleep.  Cough is painful and spells often make her dyspneic.  In the last two days, she has developed non-exertional, non-radiating pressure in the left-side of her chest, that may or may not occur simultaneous to coughs.  Has also recently developed nausea and mild, diffuse abdominal pain that she attributes to coughing.  Denies fever, nasal congestion, rhinorrhea, diarrhea and urinary sx.  Does not smoke cigarettes and no FH of early MI.  Pertinent PMH includes HTN, diabetes, TIA and TB.   Past Medical History  Diagnosis Date  . GERD (gastroesophageal reflux disease)   . Gastritis   . Stricture and stenosis of esophagus   . Obesity   . Diabetes mellitus   . Depression   . Asthma   . Hyperlipemia   . Hernia   . Hypertension   . Allergy   . Anxiety   . Arthritis     rheumatoid arithritis, DDD  . Osteoporosis   . Tuberculosis     tested positive 2011, no symptoms, was on medicine for 6 months  . TIA (transient ischemic attack)   . Plantar fasciitis     Past Surgical History  Procedure Date  . Knee surgery     Bilateral  replacement  . Cesarean section     x 2   . Breast surgery   . Joint replacement     bilat knee     Family History  Problem Relation Age of Onset  . Colon cancer Neg Hx   . Esophageal cancer Neg Hx   . Stomach cancer Neg Hx     History  Substance  Use Topics  . Smoking status: Never Smoker   . Smokeless tobacco: Never Used  . Alcohol Use: No    OB History    Grav Para Term Preterm Abortions TAB SAB Ect Mult Living                  Review of Systems  All other systems reviewed and are negative.    Allergies  Review of patient's allergies indicates no known allergies.  Home Medications   Current Outpatient Rx  Name  Route  Sig  Dispense  Refill  . ADALIMUMAB 40 MG/0.8ML [place] KIT   Subcutaneous   Inject 40 mg into the skin once a week.         . ASPIRIN 81 MG PO TABS   Oral   Take 81 mg by mouth daily.           Marland Kitchen CALCIUM CARBONATE-VITAMIN D 500-200 MG-UNIT PO TABS   Oral   Take 1 tablet by mouth daily.         Marland Kitchen CANAGLIFLOZIN 100 MG PO TABS   Oral   Take 1 tablet by mouth daily.         . CYMBALTA 60 MG PO CPEP      One  by mouth once daily          . DEXTROMETHORPHAN POLISTIREX ER 30 MG/5ML PO LQCR   Oral   Take 30 mg by mouth 2 (two) times daily as needed. For cough         . NYQUIL COLD & FLU PO   Oral   Take 15 mLs by mouth 2 (two) times daily as needed. For cold & flu symptoms         . ERGOCALCIFEROL 50000 UNITS PO CAPS   Oral   Take 50,000 Units by mouth once a week.         . ESOMEPRAZOLE MAGNESIUM 40 MG PO CPDR   Oral   Take 40 mg by mouth at bedtime.         Marland Kitchen EZETIMIBE-SIMVASTATIN 10-40 MG PO TABS   Oral   Take 1 tablet by mouth at bedtime.         . FENOFIBRATE 160 MG PO TABS   Oral   Take 160 mg by mouth daily.         Marland Kitchen FOLIC ACID 1 MG PO TABS   Oral   Take 2 mg by mouth daily.          Marland Kitchen GLIMEPIRIDE 4 MG PO TABS      One tablet by mouth once daily          . GUAIFENESIN 100 MG/5ML PO SOLN   Oral   Take 5 mLs by mouth every 4 (four) hours as needed. For cough         . HYDROXYZINE PAMOATE 25 MG PO CAPS   Oral   Take 25 mg by mouth 3 (three) times daily as needed. For itching         . INSULIN GLARGINE 100 UNIT/ML [place] SOLN   Subcutaneous    Inject 40 Units into the skin at bedtime.          Marland Kitchen LISINOPRIL 10 MG PO TABS   Oral   Take 10 mg by mouth daily.         Marland Kitchen METFORMIN HCL 1000 MG PO TABS   Oral   Take 1,000 mg by mouth 2 (two) times daily with a meal.          . METHOTREXATE SODIUM 5 MG PO TABS   Oral   Take 50 mg by mouth once a week. Caution: Chemotherapy. Protect from light.         . ADULT MULTIVITAMIN W/MINERALS CH   Oral   Take 1 tablet by mouth daily.         Marland Kitchen PROMETHAZINE HCL 25 MG PO TABS   Oral   Take 25 mg by mouth every 6 (six) hours as needed. For nausea         . ZOLEDRONIC ACID 5 MG/100ML IV SOLN   Intravenous   Inject 5 mg into the vein once.           BP 162/89  Pulse 110  Temp 98.1 F (36.7 C) (Oral)  Resp 16  Ht 5' (1.524 m)  Wt 186 lb (84.369 kg)  BMI 36.33 kg/m2  SpO2 95%  Physical Exam  Nursing note and vitals reviewed. Constitutional: She is oriented to person, place, and time. She appears well-developed and well-nourished. No distress.  HENT:  Head: Normocephalic and atraumatic.  Eyes:       Normal appearance  Neck: Normal range of motion.  Cardiovascular: Normal rate and regular rhythm.  Pulmonary/Chest: Effort normal. No respiratory distress.       Slight expiratory wheezing in left mid-lung.  Coughing.   Musculoskeletal: Normal range of motion.  Neurological: She is alert and oriented to person, place, and time.  Skin: Skin is warm and dry. No rash noted.  Psychiatric: She has a normal mood and affect. Her behavior is normal.    ED Course  Procedures (including critical care time)  Date: 10/16/2012  Rate: 99  Rhythm: normal sinus rhythm  QRS Axis: normal  Intervals: normal  ST/T Wave abnormalities: normal  Conduction Disutrbances:none  Narrative Interpretation:   Old EKG Reviewed: none available   Labs Reviewed - No data to display No results found.   No diagnosis found.    MDM  57yo F w/ h/o HTN, diabetes, TIA and TB presents w/  chronic cough x 1.5 months.  Was treated w/ abx and prednisone 1 month ago and has been using albuterol and tessalon perles w/ temporary relief.  Now associated w/ dyspnea as well as intermittent L-sided CP.  Doubt ACS despite RF; pain atypical, EKG non-ischemic, troponin neg and pt had a cath in 2011 that was neg for significant focal obstruction.  CXR pending.  Pt to receive a breathing treatment and prednisone.  Will reassess shortly.  5:28 AM     Pt reports feeling better.  Breath sounds improved and pt is no longer coughing.  CXR negative.  Troponin still pending.  If neg will d/c home.  Recommended that she d/c her lisinopril in case cough is a SE, but call her PCP to discuss today.  Also recommended f/u with pulm if no improvement in 1 week.  Prescribed prednisone and tussionex; she has albuterol at home.  Return precautions discussed.        Remer Macho, PA-C 10/16/12 [phone]

## 2012-12-11 ENCOUNTER — Encounter: Payer: Self-pay | Admitting: Gastroenterology

## 2013-01-06 ENCOUNTER — Encounter: Payer: Self-pay | Admitting: Gastroenterology

## 2013-01-06 ENCOUNTER — Ambulatory Visit (INDEPENDENT_AMBULATORY_CARE_PROVIDER_SITE_OTHER): Payer: Medicare PPO | Admitting: Gastroenterology

## 2013-01-06 VITALS — BP 100/60 | HR 70 | Ht 60.0 in | Wt 180.0 lb

## 2013-01-06 DIAGNOSIS — R112 Nausea with vomiting, unspecified: Secondary | ICD-10-CM | POA: Insufficient documentation

## 2013-01-06 HISTORY — DX: Nausea with vomiting, unspecified: R11.2

## 2013-01-06 MED ORDER — METOCLOPRAMIDE HCL 10 MG PO TABS: ORAL_TABLET | ORAL | Status: DC

## 2013-01-06 NOTE — Progress Notes (Signed)
History of Present Illness:  The patient has returned for evaluation of nausea and vomiting. For several months she has been suffering from mild nausea that is clearly worsened postprandially. She's had multiple episodes of vomiting. She also complains of postprandial upper abdominal pain. She's lost about 20 pounds. There has been no change in her medications. She denies dysphagia. She remains on Nexium.  Last endoscopy in 2012 demonstrated early esophageal stricture which was dilated. Gastric emptying scan was normal.    Review of Systems: Pertinent positive and negative review of systems were noted in the above HPI section. All other review of systems were otherwise negative.    Current Medications, Allergies, Past Medical History, Past Surgical History, Family History and Social History were reviewed in [place] record  Vital signs were reviewed in today's medical record. Physical Exam: General: Well developed , well nourished, no acute distress Skin: anicteric Head: Normocephalic and atraumatic Eyes:  sclerae anicteric, EOMI Ears: Normal auditory acuity Mouth: No deformity or lesions Lungs: Clear throughout to auscultation Heart: Regular rate and rhythm; no murmurs, rubs or bruits Abdomen: Soft,  and non distended. No masses, hepatosplenomegaly or hernias noted. Normal Bowel sounds. There is mild tenderness to palpation in the) local area. There is no succussion splash. Rectal:deferred Musculoskeletal: Symmetrical with no gross deformities  Pulses:  Normal pulses noted Extremities: No clubbing, cyanosis, edema or deformities noted Neurological: Alert oriented x 4, grossly nonfocal Psychological:  Alert and cooperative. Normal mood and affect

## 2013-01-06 NOTE — Patient Instructions (Addendum)
You have been scheduled for an endoscopy with propofol. Please follow written instructions given to you at your visit today. If you use inhalers (even only as needed), please bring them with you on the day of your procedure. Printed of side effects for Reglan from the internet

## 2013-01-06 NOTE — Assessment & Plan Note (Signed)
Patient symptoms of postprandial nausea and vomiting with abdominal pain raises the question of ulcer or nonulcer dyspepsia and gastroparesis. Note negative gastric emptying scan in 2012.  Recommendations #1 upper endoscopy #2 trial of Reglan 10 mg one of her a.c. and at bedtime #3 to consider repeat gastric emptying scan pending results of above  Instruct patient to  contact me immediately  if she develops any side effects from her Reglan including paresthesias, tremors, confusion , weakness or muscle spasms.

## 2013-01-08 ENCOUNTER — Encounter: Payer: Self-pay | Admitting: Gastroenterology

## 2013-01-08 ENCOUNTER — Other Ambulatory Visit: Payer: Self-pay | Admitting: Gastroenterology

## 2013-01-08 ENCOUNTER — Other Ambulatory Visit (INDEPENDENT_AMBULATORY_CARE_PROVIDER_SITE_OTHER): Payer: Medicare PPO

## 2013-01-08 ENCOUNTER — Ambulatory Visit (AMBULATORY_SURGERY_CENTER): Payer: Medicare PPO | Admitting: Gastroenterology

## 2013-01-08 VITALS — BP 137/85 | HR 75 | Temp 98.0°F | Resp 20 | Ht 60.0 in | Wt 180.0 lb

## 2013-01-08 DIAGNOSIS — R109 Unspecified abdominal pain: Secondary | ICD-10-CM

## 2013-01-08 DIAGNOSIS — K2980 Duodenitis without bleeding: Secondary | ICD-10-CM

## 2013-01-08 DIAGNOSIS — R112 Nausea with vomiting, unspecified: Secondary | ICD-10-CM

## 2013-01-08 DIAGNOSIS — D1330 Benign neoplasm of unspecified part of small intestine: Secondary | ICD-10-CM

## 2013-01-08 LAB — CBC WITH DIFFERENTIAL/PLATELET
Basophils Absolute: 0 10*3/uL (ref 0.0–0.1)
HCT: 41 % (ref 36.0–46.0)
Hemoglobin: 13.7 g/dL (ref 12.0–15.0)
Lymphs Abs: 3.7 10*3/uL (ref 0.7–4.0)
MCHC: 33.4 g/dL (ref 30.0–36.0)
MCV: 87.6 fl (ref 78.0–100.0)
Monocytes Absolute: 0.4 10*3/uL (ref 0.1–1.0)
Neutro Abs: 2.8 10*3/uL (ref 1.4–7.7)
Platelets: 172 10*3/uL (ref 150.0–400.0)
RDW: 15.2 % — ABNORMAL HIGH (ref 11.5–14.6)

## 2013-01-08 LAB — COMPREHENSIVE METABOLIC PANEL
ALT: 27 U/L (ref 0–35)
Alkaline Phosphatase: 61 U/L (ref 39–117)
Creatinine, Ser: 0.8 mg/dL (ref 0.4–1.2)
Sodium: 138 mEq/L (ref 135–145)
Total Bilirubin: 0.5 mg/dL (ref 0.3–1.2)
Total Protein: 7.2 g/dL (ref 6.0–8.3)

## 2013-01-08 LAB — GLUCOSE, CAPILLARY
Glucose-Capillary: 121 mg/dL — ABNORMAL HIGH (ref 70–99)
Glucose-Capillary: 96 mg/dL (ref 70–99)

## 2013-01-08 LAB — AMYLASE: Amylase: 38 U/L (ref 27–131)

## 2013-01-08 MED ORDER — SODIUM CHLORIDE 0.9 % IV SOLN: 500.0000 mL | INTRAVENOUS | Status: DC

## 2013-01-08 NOTE — Progress Notes (Signed)
Lidocaine-40mg IV prior to Propofol InductionPropofol given over incremental dosages 

## 2013-01-08 NOTE — Progress Notes (Signed)
Patient did not have preoperative order for IV antibiotic SSI prophylaxis. (G8918)  Patient did not experience any of the following events: a burn prior to discharge; a fall within the facility; wrong site/side/patient/procedure/implant event; or a hospital transfer or hospital admission upon discharge from the facility. (G8907)  

## 2013-01-08 NOTE — Op Note (Signed)
[facility]  [address] [place], [id number]   ENDOSCOPY PROCEDURE REPORT  PATIENT: Carrie [name], Carrie [name]  MR#: [id number] BIRTHDATE: Oct 13, 1956 , 73  yrs. old GENDER: Female ENDOSCOPIST: Inda Castle, MD REFERRED BY:  Eligah East, M.D. PROCEDURE DATE:  01/08/2013 PROCEDURE:  EGD w/ biopsy ASA CLASS:     Class II INDICATIONS:  Epigastric pain.   Vomiting. MEDICATIONS: MAC sedation, administered by CRNA, propofol (Diprivan) 165m IV, and Simethicone 0.6cc PO TOPICAL ANESTHETIC:  DESCRIPTION OF PROCEDURE: After the risks benefits and alternatives of the procedure were thoroughly explained, informed consent was obtained.  The LB GIF-H180 [id number] was introduced through the mouth and advanced to the third portion of the duodenum. Without limitations.  The instrument was slowly withdrawn as the mucosa was fully examined.      In the second and third portions of the duodenum there is mild erythema.  Biopsies were taken to rule out duodenitis. The very distal esophagus there are multiple erosions.   The remainder of the upper endoscopy exam was otherwise normal. Retroflexed views revealed no abnormalities.     The scope was then withdrawn from the patient and the procedure completed.  COMPLICATIONS: There were no complications. ENDOSCOPIC IMPRESSION: 1.   possible duodenitis 2.  erosive esophagitis  Erosive esophagitis may be reflective of recurrent vomiting. Findings do not explicitly explain etiology for nausea and abdominal pain.    RECOMMENDATIONS: 1.  CBC, CMET, amylase 2.  CT abd/pelvis 3.  GES if above are negative REPEAT EXAM:  eSigned:  RInda Castle MD 01/08/2013 3:19 PM   CC:

## 2013-01-08 NOTE — Patient Instructions (Addendum)
YOU HAD AN ENDOSCOPIC PROCEDURE TODAY AT [facility]: Refer to the procedure report that was given to you for any specific questions about what was found during the examination.  If the procedure report does not answer your questions, please call your gastroenterologist to clarify.  If you requested that your care partner not be given the details of your procedure findings, then the procedure report has been included in a sealed envelope for you to review at your convenience later.  YOU SHOULD EXPECT: Some feelings of bloating in the abdomen. Passage of more gas than usual.  Walking can help get rid of the air that was put into your GI tract during the procedure and reduce the bloating. If you had a lower endoscopy (such as a colonoscopy or flexible sigmoidoscopy) you may notice spotting of blood in your stool or on the toilet paper. If you underwent a bowel prep for your procedure, then you may not have a normal bowel movement for a few days.  DIET: Your first meal following the procedure should be a light meal and then it is ok to progress to your normal diet.  A half-sandwich or bowl of soup is an example of a good first meal.  Heavy or fried foods are harder to digest and may make you feel nauseous or bloated.  Likewise meals heavy in dairy and vegetables can cause extra gas to form and this can also increase the bloating.  Drink plenty of fluids but you should avoid alcoholic beverages for 24 hours.  ACTIVITY: Your care partner should take you home directly after the procedure.  You should plan to take it easy, moving slowly for the rest of the day.  You can resume normal activity the day after the procedure however you should NOT DRIVE or use heavy machinery for 24 hours (because of the sedation medicines used during the test).    SYMPTOMS TO REPORT IMMEDIATELY: A gastroenterologist can be reached at any hour.  During normal business hours, 8:30 AM to 5:00 PM Monday through Friday,  call [phone].  After hours and on weekends, please call the GI answering service at [phone] who will take a message and have the physician on call contact you.   Following upper endoscopy (EGD)  Vomiting of blood or coffee ground material  New chest pain or pain under the shoulder blades  Painful or persistently difficult swallowing  New shortness of breath  Fever of 100F or higher  Black, tarry-looking stools  FOLLOW UP: If any biopsies were taken you will be contacted by phone or by letter within the next 1-3 weeks.  Call your gastroenterologist if you have not heard about the biopsies in 3 weeks.  Our staff will call the home number listed on your records the next business day following your procedure to check on you and address any questions or concerns that you may have at that time regarding the information given to you following your procedure. This is a courtesy call and so if there is no answer at the home number and we have not heard from you through the emergency physician on call, we will assume that you have returned to your regular daily activities without incident.  SIGNATURES/CONFIDENTIALITY: You and/or your care partner have signed paperwork which will be entered into your electronic medical record.  These signatures attest to the fact that that the information above on your After Visit Summary has been reviewed and is understood.  Full responsibility  of the confidentiality of this discharge information lies with you and/or your care-partner.  You will go for blood work today, and the RN from the 3rd floor will call you with the CT date and time.   The recovery room nurse gave you the contrast with the paper.   The 3rd floor RN will tell you when to drink the liquids.

## 2013-01-09 ENCOUNTER — Telehealth: Payer: Self-pay

## 2013-01-09 ENCOUNTER — Other Ambulatory Visit: Payer: Self-pay | Admitting: Gastroenterology

## 2013-01-09 DIAGNOSIS — R109 Unspecified abdominal pain: Secondary | ICD-10-CM

## 2013-01-09 NOTE — Telephone Encounter (Signed)
Pt scheduled for CT of abdomen and pelvis at [facility] 01/13/13@1pm . Pt to be NPO after 9am, hold her metformin the day of the scan, drink first bottle of contrast at 11am and second bottle at 12noon. Left message for pt to call back regarding appt date and time.

## 2013-01-09 NOTE — Telephone Encounter (Signed)
Left message on answering machine. 

## 2013-01-12 NOTE — Telephone Encounter (Signed)
Spoke with pt and she is aware of appt date and time.

## 2013-01-13 ENCOUNTER — Ambulatory Visit (INDEPENDENT_AMBULATORY_CARE_PROVIDER_SITE_OTHER)
Admission: RE | Admit: 2013-01-13 | Discharge: 2013-01-13 | Disposition: A | Discharge status: Home / Self Care | Payer: Medicare PPO | Source: Ambulatory Visit | Attending: Gastroenterology | Admitting: Gastroenterology

## 2013-01-13 DIAGNOSIS — R109 Unspecified abdominal pain: Secondary | ICD-10-CM

## 2013-01-13 MED ORDER — IOHEXOL 300 MG/ML  SOLN
100.0000 mL | Freq: Once | INTRAMUSCULAR | Status: AC | PRN
  Administered 2013-01-13: 100 mL via INTRAVENOUS

## 2013-01-14 ENCOUNTER — Encounter: Payer: Self-pay | Admitting: Gastroenterology

## 2013-01-14 ENCOUNTER — Telehealth: Payer: Self-pay | Admitting: Gastroenterology

## 2013-01-14 NOTE — Telephone Encounter (Signed)
Pt called requesting the results of her CT scan and EGD results. Please advise.

## 2013-01-15 ENCOUNTER — Other Ambulatory Visit: Payer: Self-pay | Admitting: Gastroenterology

## 2013-01-15 DIAGNOSIS — R109 Unspecified abdominal pain: Secondary | ICD-10-CM

## 2013-01-15 NOTE — Telephone Encounter (Signed)
CT and EGD are negative.  Let's proceed with GES

## 2013-01-15 NOTE — Telephone Encounter (Signed)
Left message for pt to call back  °

## 2013-01-16 ENCOUNTER — Encounter: Payer: Self-pay | Admitting: Gastroenterology

## 2013-01-19 ENCOUNTER — Telehealth: Payer: Self-pay | Admitting: Gastroenterology

## 2013-01-19 NOTE — Telephone Encounter (Signed)
Pt aware and order placed for GES.

## 2013-01-19 NOTE — Telephone Encounter (Signed)
Pt scheduled for GES at [facility] 01/28/13. Pt to arrive at [facility] at 9:45am for a 10am appt. Pt to be NPO after midnight and hold stomach meds for 24 hours prior to test. Pt aware of appt date and time.

## 2013-01-28 ENCOUNTER — Encounter (HOSPITAL_COMMUNITY)
Admission: RE | Admit: 2013-01-28 | Discharge: 2013-01-28 | Disposition: A | Discharge status: Home / Self Care | Payer: Medicare PPO | Source: Ambulatory Visit | Attending: Gastroenterology | Admitting: Gastroenterology

## 2013-01-28 DIAGNOSIS — I10 Essential (primary) hypertension: Secondary | ICD-10-CM | POA: Insufficient documentation

## 2013-01-28 DIAGNOSIS — R6881 Early satiety: Secondary | ICD-10-CM | POA: Insufficient documentation

## 2013-01-28 DIAGNOSIS — K219 Gastro-esophageal reflux disease without esophagitis: Secondary | ICD-10-CM | POA: Insufficient documentation

## 2013-01-28 DIAGNOSIS — R112 Nausea with vomiting, unspecified: Secondary | ICD-10-CM | POA: Insufficient documentation

## 2013-01-28 DIAGNOSIS — K449 Diaphragmatic hernia without obstruction or gangrene: Secondary | ICD-10-CM | POA: Insufficient documentation

## 2013-01-28 DIAGNOSIS — R109 Unspecified abdominal pain: Secondary | ICD-10-CM

## 2013-01-28 DIAGNOSIS — E119 Type 2 diabetes mellitus without complications: Secondary | ICD-10-CM | POA: Insufficient documentation

## 2013-01-28 MED ORDER — TECHNETIUM TC 99M SULFUR COLLOID
2.1000 | Freq: Once | INTRAVENOUS | Status: AC | PRN
Start: 2013-01-28 — End: 2013-01-28
  Administered 2013-01-28: 2.1 via INTRAVENOUS

## 2013-02-25 ENCOUNTER — Encounter: Payer: Self-pay | Admitting: Gastroenterology

## 2013-02-25 ENCOUNTER — Ambulatory Visit (INDEPENDENT_AMBULATORY_CARE_PROVIDER_SITE_OTHER): Payer: Medicare PPO | Admitting: Gastroenterology

## 2013-02-25 VITALS — BP 122/66 | HR 76 | Ht 60.0 in | Wt 182.4 lb

## 2013-02-25 DIAGNOSIS — R059 Cough, unspecified: Secondary | ICD-10-CM | POA: Insufficient documentation

## 2013-02-25 DIAGNOSIS — R112 Nausea with vomiting, unspecified: Secondary | ICD-10-CM

## 2013-02-25 DIAGNOSIS — K3184 Gastroparesis: Secondary | ICD-10-CM | POA: Insufficient documentation

## 2013-02-25 DIAGNOSIS — R05 Cough: Secondary | ICD-10-CM

## 2013-02-25 MED ORDER — FAMOTIDINE 40 MG PO TABS: 40.0000 mg | ORAL_TABLET | Freq: Every day | ORAL | Status: DC

## 2013-02-25 NOTE — Assessment & Plan Note (Signed)
Patient has a nonproductive cough that is mostly nocturnal. This could reflect reflux.  Recommendations #1 continue Nexium every morning #2 trial of Pepcid each bedtime; if not improved I will refer her to pulmonary

## 2013-02-25 NOTE — Assessment & Plan Note (Signed)
See comments under nausea vomiting

## 2013-02-25 NOTE — Progress Notes (Signed)
History of Present Illness:  Carrie [name] has returned for followup of nausea. Endoscopy and CT scan were negative. A gastric empty scan was borderline abnormal. She was placed on Reglan and reports complete resolution of her symptoms. She is without nausea and pain.  Her main complaint is a nonproductive cough that tends to occur at night.    Review of Systems: Pertinent positive and negative review of systems were noted in the above HPI section. All other review of systems were otherwise negative.    Current Medications, Allergies, Past Medical History, Past Surgical History, Family History and Social History were reviewed in [place] record  Vital signs were reviewed in today's medical record. Physical Exam: General: Well developed , well nourished, no acute distress

## 2013-02-25 NOTE — Patient Instructions (Addendum)
Use 1 pepcid every night at bedtime  Research will be speaking with you today

## 2013-02-25 NOTE — Assessment & Plan Note (Signed)
Resolved after starting Reglan.  Gastric empty scan is borderline abnormal. Plan to continue with the same and then try to wean after 3 months. Patient will also be referred to a gastroparesis study.

## 2013-03-10 ENCOUNTER — Ambulatory Visit (INDEPENDENT_AMBULATORY_CARE_PROVIDER_SITE_OTHER): Payer: Self-pay

## 2013-03-10 DIAGNOSIS — K3184 Gastroparesis: Secondary | ICD-10-CM

## 2013-03-16 ENCOUNTER — Ambulatory Visit: Payer: Self-pay

## 2013-03-25 ENCOUNTER — Telehealth: Payer: Self-pay | Admitting: Gastroenterology

## 2013-03-25 NOTE — Telephone Encounter (Signed)
Pt states the pepcid she is taking is not working, she is still coughing. Pt is requesting a referral to Pulmonary. Please advise.

## 2013-03-30 ENCOUNTER — Other Ambulatory Visit: Payer: Self-pay | Admitting: Gastroenterology

## 2013-03-30 DIAGNOSIS — R05 Cough: Secondary | ICD-10-CM

## 2013-03-30 NOTE — Telephone Encounter (Signed)
Referral entered into epic.

## 2013-03-30 NOTE — Telephone Encounter (Signed)
Ok to refer to pulmonary

## 2013-04-01 NOTE — Telephone Encounter (Signed)
Pt scheduled to see Dr. Melvyn Novas tomorrow afternoon at 3pm. Pt to arrive at the office at 2:45pm. Pt aware of appt date and time.

## 2013-04-02 ENCOUNTER — Ambulatory Visit (INDEPENDENT_AMBULATORY_CARE_PROVIDER_SITE_OTHER)
Admission: RE | Admit: 2013-04-02 | Discharge: 2013-04-02 | Disposition: A | Discharge status: Home / Self Care | Payer: Medicare PPO | Source: Ambulatory Visit | Attending: Internal Medicine | Admitting: Internal Medicine

## 2013-04-02 ENCOUNTER — Ambulatory Visit (INDEPENDENT_AMBULATORY_CARE_PROVIDER_SITE_OTHER): Payer: Self-pay

## 2013-04-02 ENCOUNTER — Encounter: Payer: Self-pay | Admitting: Internal Medicine

## 2013-04-02 ENCOUNTER — Ambulatory Visit (INDEPENDENT_AMBULATORY_CARE_PROVIDER_SITE_OTHER): Payer: Medicare PPO | Admitting: Internal Medicine

## 2013-04-02 VITALS — BP 140/80 | HR 75 | Temp 97.8°F | Ht 60.5 in | Wt 184.0 lb

## 2013-04-02 DIAGNOSIS — R05 Cough: Secondary | ICD-10-CM

## 2013-04-02 DIAGNOSIS — K3184 Gastroparesis: Secondary | ICD-10-CM

## 2013-04-02 DIAGNOSIS — J45909 Unspecified asthma, uncomplicated: Secondary | ICD-10-CM

## 2013-04-02 MED ORDER — ESOMEPRAZOLE MAGNESIUM 40 MG PO CPDR: 40.0000 mg | DELAYED_RELEASE_CAPSULE | Freq: Every day | ORAL | Status: DC

## 2013-04-02 MED ORDER — FAMOTIDINE 40 MG PO TABS: 40.0000 mg | ORAL_TABLET | Freq: Every day | ORAL | Status: DC

## 2013-04-02 MED ORDER — PREDNISONE (PAK) 10 MG PO TABS: ORAL_TABLET | ORAL | Status: DC

## 2013-04-02 NOTE — Assessment & Plan Note (Signed)
The most common causes of chronic cough in immunocompetent adults include the following: upper airway cough syndrome (UACS), previously referred to as postnasal drip syndrome (PNDS), which is caused by variety of rhinosinus conditions; (2) asthma; (3) GERD; (4) chronic bronchitis from cigarette smoking or other inhaled environmental irritants; (5) nonasthmatic eosinophilic bronchitis; and (6) bronchiectasis.   These conditions, singly or in combination, have accounted for up to 94% of the causes of chronic cough in prospective studies.   Other conditions have constituted no >6% of the causes in prospective studies These have included bronchogenic carcinoma, chronic interstitial pneumonia, sarcoidosis, left ventricular failure, ACEI-induced cough, and aspiration from a condition associated with pharyngeal dysfunction.    Chronic cough is often simultaneously caused by more than one condition. A single cause has been found from 38 to 82% of the time, multiple causes from 18 to 62%. Multiply caused cough has been the result of three diseases up to 42% of the time.       Most likely this is  Classic Upper airway cough syndrome, so named because it's frequently impossible to sort out how much is  CR/sinusitis with freq throat clearing (which can be related to primary GERD)   vs  causing  secondary (" extra esophageal")  GERD from wide swings in gastric pressure that occur with throat clearing, often  promoting self use of mint and menthol lozenges that reduce the lower esophageal sphincter tone and exacerbate the problem further in a cyclical fashion.   These are the same pts (now being labeled as having "irritable larynx syndrome" by some cough centers) who not infrequently have a history of having failed to tolerate ace inhibitors,  dry powder inhalers or biphosphonates or report having atypical reflux symptoms that don't respond to standard doses of PPI , and are easily confused as having aecopd or asthma  flares by even experienced allergists/ pulmonologists.   For now completely eliminate gerd as much as possible while stopping all cyclical cough then regroup  See instructions for specific recommendations which were reviewed directly with the patient who was given a copy with highlighter outlining the key components.

## 2013-04-02 NOTE — Assessment & Plan Note (Signed)
Not clear how much if any of her symptoms are asthma related but certainly uacs can be easily confused with asthma.  For now since hfa can make cough worse, she should only use for sob, not cough

## 2013-04-02 NOTE — Progress Notes (Signed)
  Subjective:    Patient ID: Carrie [name], female    DOB: Mar 02, 1956  MRN: [id number]  HPI  88 yowf never smoker with morbid obesity complicated by aodm/ gastroparesis with new cough onset in April 2014 referred by Dr Doretha Sou 04/02/2013 to pulmonary clinic.  04/02/2013 1st pulmonary ov pattern x 10 year of sev times per year typically in winter chest colds/severe "bronchitis" rx abx and prednisone and inhalers in between felt fine and no need for any chronic  meds -  this episode was different from her usual "bronchitis" started in April 2014 more dry, more severe to point of gagging never vomit, coughed to point wherecouldn't breath, better if drink water. Not better with inaler but did not get abx or prednisonethis time. Worst during the day but also present at hs  Wt is near baseline, maybe up about 10 since before the cough. Breathing ok if not coughing, also with urinary incont during cough.  Already tried nexium and pepcid though confused with instructions re which to take when  No obvious daytime variabilty or assoc chronic   chest tightness, subjective wheeze overt sinus or hb symptoms. No unusual exp hx or h/o childhood pna/ asthma or knowledge of premature birth.     Also denies any obvious fluctuation of symptoms with weather or environmental changes or other aggravating or alleviating factors except as outlined above    Review of Systems  Constitutional: Negative for fever, chills and unexpected weight change.  HENT: Negative for ear pain, nosebleeds, congestion, sore throat, rhinorrhea, sneezing, trouble swallowing, dental problem, voice change, postnasal drip and sinus pressure.   Eyes: Negative for visual disturbance.  Respiratory: Positive for cough and shortness of breath. Negative for choking.   Cardiovascular: Negative for chest pain and leg swelling.  Gastrointestinal: Negative for vomiting, abdominal pain and diarrhea.  Genitourinary: Negative for difficulty urinating.   Musculoskeletal: Negative for arthralgias.  Skin: Negative for rash.  Neurological: Negative for tremors, syncope and headaches.  Hematological: Does not bruise/bleed easily.       Objective:   Physical Exam Wt Readings from Last 3 Encounters:  04/02/13 184 lb (83.462 kg)  02/25/13 182 lb 6.4 oz (82.736 kg)  01/08/13 180 lb (81.647 kg)    Edentulous cough on insp  HEENT: nl dentition, turbinates, and orophanx. Nl external ear canals without cough reflex   NECK :  without JVD/Nodes/TM/ nl carotid upstrokes bilaterally   LUNGS: no acc muscle use, clear to A and P bilaterally without cough on insp or exp maneuvers   CV:  RRR  no s3 or murmur or increase in P2, no edema   ABD:  soft and nontender with nl excursion in the supine position. No bruits or organomegaly, bowel sounds nl  MS:  warm without deformities, calf tenderness, cyanosis or clubbing  SKIN: warm and dry without lesions    NEURO:  alert, approp, no deficits     CXR  04/02/2013 :   1. No acute cardiopulmonary abnormalities       Assessment & Plan:

## 2013-04-02 NOTE — Patient Instructions (Addendum)
Only use the inhaler if you can't catch your breath.   The key to effective treatment for your cough is eliminating the non-stop cycle of cough you're stuck in long enough to let your airway heal completely and then see if there is anything still making you cough once you stop the cough suppression, but this should take no more than 5 days to figure out  First take delsym two tsp every 12 hours and supplement if needed with  tramadol 50 mg up to 2 every 4 hours to suppress the urge to cough at all or even clear your throat. Swallowing water or using ice chips/non mint and menthol containing candies (such as lifesavers or sugarless jolly ranchers) are also effective.  You should rest your voice and avoid activities that you know make you cough.  Once you have eliminated the cough for 3 straight days try reducing the tramadol first,  then the delsym as tolerated.    Try nexium 40 mg Take 30-60 min before first meal of the day and Pepcid 40 mg one bedtime   .  GERD (REFLUX)  is an extremely common cause of respiratory symptoms, many times with no significant heartburn at all.    It can be treated with medication, but also with lifestyle changes including avoidance of late meals, excessive alcohol, smoking cessation, and avoid fatty foods, chocolate, peppermint, colas, red wine, and acidic juices such as orange juice.  NO MINT OR MENTHOL PRODUCTS SO NO COUGH DROPS  USE SUGARLESS CANDY INSTEAD (jolley ranchers or Stover's)  NO OIL BASED VITAMINS - use powdered substitutes.    Prednisone 10 mg take  4 each am x 2 days,   2 each am x 2 days,  1 each am x 2 days and stop    Please remember to go to the lab and x-ray department downstairs for your tests - we will call you with the results when they are available.    Please schedule a follow up office visit in 2 weeks, sooner if needed

## 2013-04-03 ENCOUNTER — Other Ambulatory Visit: Payer: Self-pay | Admitting: Internal Medicine

## 2013-04-03 MED ORDER — TRAMADOL HCL 50 MG PO TABS: 50.0000 mg | ORAL_TABLET | ORAL | Status: DC | PRN

## 2013-04-03 NOTE — Progress Notes (Signed)
Quick Note:  Spoke with pt and notified of results per Dr. Wert. Pt verbalized understanding and denied any questions.  ______ 

## 2013-04-09 ENCOUNTER — Ambulatory Visit: Payer: Self-pay

## 2013-04-13 ENCOUNTER — Ambulatory Visit (INDEPENDENT_AMBULATORY_CARE_PROVIDER_SITE_OTHER): Payer: Self-pay

## 2013-04-13 DIAGNOSIS — K3184 Gastroparesis: Secondary | ICD-10-CM

## 2013-04-20 ENCOUNTER — Encounter: Payer: Self-pay | Admitting: Internal Medicine

## 2013-04-20 ENCOUNTER — Ambulatory Visit (INDEPENDENT_AMBULATORY_CARE_PROVIDER_SITE_OTHER): Payer: Medicare PPO | Admitting: Internal Medicine

## 2013-04-20 ENCOUNTER — Ambulatory Visit: Payer: Self-pay

## 2013-04-20 VITALS — BP 130/80 | HR 87 | Temp 98.1°F | Ht 60.5 in | Wt 187.2 lb

## 2013-04-20 DIAGNOSIS — R05 Cough: Secondary | ICD-10-CM

## 2013-04-20 DIAGNOSIS — I10 Essential (primary) hypertension: Secondary | ICD-10-CM

## 2013-04-20 MED ORDER — TELMISARTAN 40 MG PO TABS: ORAL_TABLET | ORAL | Status: DC

## 2013-04-20 NOTE — Patient Instructions (Addendum)
Stop lisinopril  Start micardis 40 mg one half daily   See Tammy NP w/in 2 weeks with all your medications, even over the counter meds, separated in two separate bags, the ones you take no matter what (the ones in the pillboxes which you should also bring with you)  vs the ones you stop once you feel better and take only as needed when you feel you need them.   Tammy  will generate for you a new user friendly medication calendar that will put [place] all on the same page re: your medication use.     Without this process, it simply isn't possible to assure that we are providing  your outpatient care  with  the attention to detail we feel you deserve.   If we cannot assure that you're getting that kind of care,  then we cannot manage your problem effectively from this clinic.  Once you have seen Tammy and we are sure that we're all on the same page with your medication use she will arrange follow up with primary care if better and f/u with me.

## 2013-04-20 NOTE — Progress Notes (Signed)
Subjective:    Patient ID: Carrie [name], female    DOB: 1956/06/06  MRN: [id number]  HPI  18 yowf never smoker with morbid obesity complicated by aodm/ gastroparesis with new cough onset in April 2014 referred by Dr Doretha Sou 04/02/2013 to pulmonary clinic.  04/02/2013 1st pulmonary ov pattern x 10 year of sev times per year typically in winter chest colds/severe "bronchitis" rx abx and prednisone and inhalers in between felt fine and no need for any chronic  meds -  this episode was different from her usual "bronchitis" started in April 2014 more dry, more severe to point of gagging never vomit, coughed to point wherecouldn't breath, better if drink water. Not better with inaler but did not get abx or prednisonethis time. Worst during the day but also present at hs  Wt is near baseline, maybe up about 10 since before the cough. Breathing ok if not coughing, also with urinary incont during cough.  Already tried nexium and pepcid though confused with instructions re which to take when rec Only use the inhaler if you can't catch your breath First take delsym two tsp every 12 hours and supplement if needed with  tramadol 50 mg up to 2 every 4 hours to suppress the urge to cough at all or even clear your throat.  Try nexium 40 mg Take 30-60 min before first meal of the day and Pepcid 40 mg one bedtime  GERD diet  Prednisone 10 mg take  4 each am x 2 days,   2 each am x 2 days,  1 each am x 2 days and stop        04/20/2013 f/u ov/Aislynn Cifelli re cough/ on acei (apparently on it at previous ov as well) Chief Complaint  Patient presents with  . Follow-up    Pt states that her cough is 50% improved since her last visit. No new co's today.    No sob or need for saba, cough is dry and day > night, "like something stuck in throat"  No obvious daytime variabilty or assoc chronic chest tightness, subjective wheeze overt sinus or hb symptoms. No unusual exp hx or h/o childhood pna/ asthma or knowledge of  premature birth.     Also denies any obvious fluctuation of symptoms with weather or environmental changes or other aggravating or alleviating factors except as outlined above   Current Medications, Allergies, Past Medical History, Past Surgical History, Family History, and Social History were reviewed in [facility] record.  ROS  The following are not active complaints unless bolded sore throat, dysphagia, dental problems, itching, sneezing,  nasal congestion or excess/ purulent secretions, ear ache,   fever, chills, sweats, unintended wt loss, pleuritic or exertional cp, hemoptysis,  orthopnea pnd or leg swelling, presyncope, palpitations, heartburn, abdominal pain, anorexia, nausea, vomiting, diarrhea  or change in bowel or urinary habits, change in stools or urine, dysuria,hematuria,  rash, arthralgias, visual complaints, headache, numbness weakness or ataxia or problems with walking or coordination,  change in mood/affect or memory.            Objective:   Physical Exam  04/20/2013        187  Wt Readings from Last 3 Encounters:  04/02/13 184 lb (83.462 kg)  02/25/13 182 lb 6.4 oz (82.736 kg)  01/08/13 180 lb (81.647 kg)    Edentulous amb wf with  Cough early  on insp  HEENT:  Nl  turbinates, and orophanx. Nl external ear canals  without cough reflex   NECK :  without JVD/Nodes/TM/ nl carotid upstrokes bilaterally   LUNGS: no acc muscle use, clear to A and P bilaterally without cough on insp or exp maneuvers   CV:  RRR  no s3 or murmur or increase in P2, no edema   ABD:  soft and nontender with nl excursion in the supine position. No bruits or organomegaly, bowel sounds nl  MS:  warm without deformities, calf tenderness, cyanosis or clubbing  SKIN: warm and dry without lesions    NEURO:  alert, approp, no deficits     CXR  04/02/2013 :   1. No acute cardiopulmonary abnormalities       Assessment & Plan:

## 2013-04-21 DIAGNOSIS — I10 Essential (primary) hypertension: Secondary | ICD-10-CM | POA: Insufficient documentation

## 2013-04-21 NOTE — Assessment & Plan Note (Addendum)
May be on acei more for renal protection and the data is not as strong for arb here so this may be a patient that once the cough has resolved could be tried back on low dose acei with the caution that it's not worth using acei if having to use multiple meds to counter the acei effects  For now rec micardis until stable baseline bp, symptom control, minimze meds -  To keep things simple, I have asked the patient to first separate medicines that are perceived as maintenance, that is to be taken daily "no matter what", from those medicines that are taken on only on an as-needed basis and I have given the patient examples of both, and then return to see our NP to generate a  detailed  medication calendar which should be followed until the next physician sees the patient and updates it.    See instructions for specific recommendations which were reviewed directly with the patient who was given a copy with highlighter outlining the key components.

## 2013-04-21 NOTE — Assessment & Plan Note (Signed)
In retrospect this is  Classic Upper airway cough syndrome, so named because it's frequently impossible to sort out how much is  CR/sinusitis with freq throat clearing (which can be related to primary GERD)   vs  causing  secondary (" extra esophageal")  GERD from wide swings in gastric pressure that occur with throat clearing, often  promoting self use of mint and menthol lozenges that reduce the lower esophageal sphincter tone and exacerbate the problem further in a cyclical fashion.   These are the same pts (now being labeled as having "irritable larynx syndrome" by some cough centers) who not infrequently have a history of having failed to tolerate ace inhibitors,  dry powder inhalers or biphosphonates or report having atypical reflux symptoms that don't respond to standard doses of PPI , and are easily confused as having aecopd or asthma flares by even experienced allergists/ pulmonologists.  Needs trial off acei then regroup in terms of longterm bp and gerd rx  See instructions for specific recommendations which were reviewed directly with the patient who was given a copy with highlighter outlining the key components.

## 2013-04-21 NOTE — Assessment & Plan Note (Signed)
>>  ASSESSMENT AND PLAN FOR ESSENTIAL HYPERTENSION WRITTEN ON 04/21/2013  6:12 AM BY Vernestine Gondola B, MD  May be on acei more for renal protection and the data is not as strong for arb here so this may be a patient that once the cough has resolved could be tried back on low dose acei with the caution that it's not worth using acei if having to use multiple meds to counter the acei effects  For now rec micardis  until stable baseline bp, symptom control, minimze meds -  To keep things simple, I have asked the patient to first separate medicines that are perceived as maintenance, that is to be taken daily "no matter what", from those medicines that are taken on only on an as-needed basis and I have given the patient examples of both, and then return to see our NP to generate a  detailed  medication calendar which should be followed until the next physician sees the patient and updates it.    See instructions for specific recommendations which were reviewed directly with the patient who was given a copy with highlighter outlining the key components.

## 2013-04-24 ENCOUNTER — Ambulatory Visit: Payer: Self-pay

## 2013-04-27 ENCOUNTER — Telehealth: Payer: Self-pay | Admitting: Internal Medicine

## 2013-04-27 MED ORDER — TELMISARTAN 40 MG PO TABS: ORAL_TABLET | ORAL | Status: DC

## 2013-04-27 NOTE — Telephone Encounter (Signed)
No samples available  We will have to send in rx micardis 40 mg 1/2 tablet daily Needs to verify pharmacy-[facility]MT[facility]

## 2013-04-27 NOTE — Telephone Encounter (Signed)
Pt advised and states pharmacy is [facility] [address]. Rx sent. Royal Kunia Bing, CMA

## 2013-04-29 ENCOUNTER — Ambulatory Visit (INDEPENDENT_AMBULATORY_CARE_PROVIDER_SITE_OTHER): Payer: Self-pay

## 2013-04-29 DIAGNOSIS — K3184 Gastroparesis: Secondary | ICD-10-CM

## 2013-05-05 ENCOUNTER — Encounter: Payer: Self-pay | Admitting: Adult Health

## 2013-05-05 ENCOUNTER — Ambulatory Visit (INDEPENDENT_AMBULATORY_CARE_PROVIDER_SITE_OTHER): Payer: Medicare PPO | Admitting: Adult Health

## 2013-05-05 VITALS — BP 128/84 | HR 95 | Temp 98.1°F | Ht 60.0 in | Wt 187.2 lb

## 2013-05-05 DIAGNOSIS — R05 Cough: Secondary | ICD-10-CM

## 2013-05-05 DIAGNOSIS — I10 Essential (primary) hypertension: Secondary | ICD-10-CM

## 2013-05-05 MED ORDER — LOSARTAN POTASSIUM 50 MG PO TABS: 50.0000 mg | ORAL_TABLET | Freq: Every day | ORAL | Status: DC

## 2013-05-05 NOTE — Progress Notes (Signed)
Subjective:    Patient ID: Carrie [name], female    DOB: 10-22-1955  MRN: [id number]  HPI 75 yowf never smoker with morbid obesity complicated by aodm/ gastroparesis with new cough onset in April 2014 referred by Dr Doretha Sou 04/02/2013 to pulmonary clinic.  04/02/2013 1st pulmonary ov pattern x 10 year of sev times per year typically in winter chest colds/severe "bronchitis" rx abx and prednisone and inhalers in between felt fine and no need for any chronic  meds -  this episode was different from her usual "bronchitis" started in April 2014 more dry, more severe to point of gagging never vomit, coughed to point wherecouldn't breath, better if drink water. Not better with inaler but did not get abx or prednisonethis time. Worst during the day but also present at hs  Wt is near baseline, maybe up about 10 since before the cough. Breathing ok if not coughing, also with urinary incont during cough.  Already tried nexium and pepcid though confused with instructions re which to take when rec Only use the inhaler if you can't catch your breath First take delsym two tsp every 12 hours and supplement if needed with  tramadol 50 mg up to 2 every 4 hours to suppress the urge to cough at all or even clear your throat.  Try nexium 40 mg Take 30-60 min before first meal of the day and Pepcid 40 mg one bedtime  GERD diet  Prednisone 10 mg take  4 each am x 2 days,   2 each am x 2 days,  1 each am x 2 days and stop     /04/2013 f/u ov/Wert re cough/ on acei (apparently on it at previous ov as well) Chief Complaint  Patient presents with  . Follow-up    Pt states that her cough is 50% improved since her last visit. No new co's today.  >>>stop lisinopril , rx micardis    05/05/2013 Follow up and Med review   new med calendar - pt brought all meds with her today.  reports cough 75% better on the Micardis, but has been taking 1 whole tab.  also, insurance doesn't cover the Micardis. Reviewed all her medications  and organized them into a medication calendar with patient education Says that her cough is much improved. She denies any hemoptysis, orthopnea, PND, or leg swelling. No discolored mucus.    Current Medications, Allergies, Past Medical History, Past Surgical History, Family History, and Social History were reviewed in [employer] [employer] record.  ROS  The following are not active complaints unless bolded sore throat, dysphagia, dental problems, itching, sneezing,  nasal congestion or excess/ purulent secretions, ear ache,   fever, chills, sweats, unintended wt loss, pleuritic or exertional cp, hemoptysis,  orthopnea pnd or leg swelling, presyncope, palpitations, heartburn, abdominal pain, anorexia, nausea, vomiting, diarrhea  or change in bowel or urinary habits, change in stools or urine, dysuria,hematuria,  rash, arthralgias, visual complaints, headache, numbness weakness or ataxia or problems with walking or coordination,  change in mood/affect or memory.            Objective:   Physical Exam  04/20/2013        187  >187 7/22    Edentulous amb wf with  Cough early  on insp  HEENT:  Nl  turbinates, and orophanx. Nl external ear canals without cough reflex   NECK :  without JVD/Nodes/TM/ nl carotid upstrokes bilaterally   LUNGS: no acc muscle use, clear  to A and P bilaterally without cough on insp or exp maneuvers   CV:  RRR  no s3 or murmur or increase in P2, no edema   ABD:  soft and nontender with nl excursion in the supine position. No bruits or organomegaly, bowel sounds nl  MS:  warm without deformities, calf tenderness, cyanosis or clubbing  SKIN: warm and dry without lesions    NEURO:  alert, approp, no deficits     CXR  04/02/2013 :   1. No acute cardiopulmonary abnormalities       Assessment & Plan:

## 2013-05-05 NOTE — Patient Instructions (Addendum)
Follow med calendar closely and bring to each visit.  Avoid ACE Inhibitors in future as they can cause a cough.  May take Losartan 25m daily (in place of micardis) follow up Dr. WMelvyn Novas In 2 months and As needed

## 2013-05-07 NOTE — Assessment & Plan Note (Signed)
Improved off ACE Inhbitor   Patient's medications were reviewed today and patient education was given. Computerized medication calendar was adjusted/completed  Plan  Follow med calendar closely and bring to each visit.  Avoid ACE Inhibitors in future as they can cause a cough.  May take Losartan 30m daily (in place of micardis) follow up Dr. WMelvyn Novas In 2 months and As needed

## 2013-05-07 NOTE — Assessment & Plan Note (Signed)
Controlled on current regimen. Was changed over to losartan. Due to insurance coverage.

## 2013-05-07 NOTE — Assessment & Plan Note (Signed)
>>  ASSESSMENT AND PLAN FOR ESSENTIAL HYPERTENSION WRITTEN ON 05/07/2013  3:07 PM BY PARRETT, TAMMY S, NP   Controlled on current regimen. Was changed over to losartan . Due to insurance coverage.

## 2013-05-12 NOTE — Addendum Note (Signed)
Addended by: Parke Poisson E on: 05/12/2013 09:12 AM   Modules accepted: Orders

## 2013-05-25 ENCOUNTER — Other Ambulatory Visit: Payer: Self-pay | Admitting: Internal Medicine

## 2013-06-08 ENCOUNTER — Other Ambulatory Visit (HOSPITAL_COMMUNITY): Payer: Self-pay | Admitting: Rheumatology

## 2013-06-09 ENCOUNTER — Telehealth: Payer: Self-pay | Admitting: Gastroenterology

## 2013-06-09 NOTE — Telephone Encounter (Signed)
Daughter states that her mother needs a letter dictated that states her health and the fact that she cannot just leave and move to [place] with her husband. Husband is getting ready to be deported and they are trying to keep him here. Instructed them to bring in paperwork stating exactly what needs to be included in the letter. Daughter states she will bring the paperwork in for Dr. Deatra Ina. Dr. Deatra Ina aware.

## 2013-06-11 ENCOUNTER — Ambulatory Visit (HOSPITAL_COMMUNITY)
Admission: RE | Admit: 2013-06-11 | Discharge: 2013-06-11 | Disposition: A | Discharge status: Home / Self Care | Payer: Medicare PPO | Source: Ambulatory Visit | Attending: Rheumatology | Admitting: Rheumatology

## 2013-06-11 ENCOUNTER — Encounter (HOSPITAL_COMMUNITY): Payer: Self-pay

## 2013-06-11 DIAGNOSIS — M818 Other osteoporosis without current pathological fracture: Secondary | ICD-10-CM | POA: Insufficient documentation

## 2013-06-11 DIAGNOSIS — T380X5A Adverse effect of glucocorticoids and synthetic analogues, initial encounter: Secondary | ICD-10-CM | POA: Insufficient documentation

## 2013-06-11 MED ORDER — SODIUM CHLORIDE 0.9 % IV SOLN
INTRAVENOUS | Status: AC
  Administered 2013-06-11: 10:00:00 via INTRAVENOUS

## 2013-06-11 MED ORDER — ZOLEDRONIC ACID 5 MG/100ML IV SOLN
5.0000 mg | Freq: Once | INTRAVENOUS | Status: AC
  Administered 2013-06-11: 5 mg via INTRAVENOUS
  Filled 2013-06-11: qty 100

## 2013-06-11 NOTE — Progress Notes (Signed)
Tolerated Reclast Infusion well. No signs of adverse reaction noted. Instructed to call MD for problems and concerns once discharged from Short Stay. Verbalizes understanding of discharge instructions.

## 2013-07-06 ENCOUNTER — Ambulatory Visit (INDEPENDENT_AMBULATORY_CARE_PROVIDER_SITE_OTHER): Payer: Medicare PPO | Admitting: Internal Medicine

## 2013-07-06 ENCOUNTER — Encounter: Payer: Self-pay | Admitting: Internal Medicine

## 2013-07-06 VITALS — BP 124/70 | HR 80 | Temp 98.4°F | Ht 60.0 in | Wt 184.0 lb

## 2013-07-06 DIAGNOSIS — R05 Cough: Secondary | ICD-10-CM

## 2013-07-06 DIAGNOSIS — I10 Essential (primary) hypertension: Secondary | ICD-10-CM

## 2013-07-06 NOTE — Assessment & Plan Note (Signed)
Adequate control on present rx, reviewed > no change in rx needed  (off acei indefinitely)

## 2013-07-06 NOTE — Assessment & Plan Note (Signed)
>>  ASSESSMENT AND PLAN FOR ESSENTIAL HYPERTENSION WRITTEN ON 07/06/2013 11:35 AM BY WERT, MICHAEL B, MD  Adequate control on present rx, reviewed > no change in rx needed  (off acei indefinitely)

## 2013-07-06 NOTE — Patient Instructions (Addendum)
See calendar for specific medication instructions and bring it back for each and every office visit for every healthcare provider you see.  Without it,  you may not receive the best quality medical care that we feel you deserve.  You will note that the calendar groups together  your maintenance  medications that are timed at particular times of the day.  Think of this as your checklist for what your doctor has instructed you to do until your next evaluation to see what benefit  there is  to staying on a consistent group of medications intended to keep you well.  The other group at the bottom is entirely up to you to use as you see fit  for specific symptoms that may arise between visits that require you to treat them on an as needed basis.  Think of this as your action plan or "what if" list.   Separating the top medications from the bottom group is fundamental to providing you adequate care going forward.     If you are satisfied with your treatment plan let your doctor know and he/she can either refill your medications or you can return here when your prescription runs out.     If in any way you are not 100% satisfied,  please tell us.  If 100% better, tell your friends!

## 2013-07-06 NOTE — Assessment & Plan Note (Addendum)
-   Trial off acei 7/7 /2014 > resolved  In retrospect  Classic Upper airway cough syndrome, so named because it's frequently impossible to sort out how much is  CR/sinusitis with freq throat clearing (which can be related to primary GERD)   vs  causing  secondary (" extra esophageal")  GERD from wide swings in gastric pressure that occur with throat clearing, often  promoting self use of mint and menthol lozenges that reduce the lower esophageal sphincter tone and exacerbate the problem further in a cyclical fashion.   These are the same pts (now being labeled as having "irritable larynx syndrome" by some cough centers) who not infrequently have a history of having failed to tolerate ace inhibitors as is almost certainly the case here ,  dry powder inhalers or biphosphonates or report having atypical reflux symptoms that don't respond to standard doses of PPI which may also be the case  , and are easily confused as having aecopd or asthma flares by even experienced allergists/ pulmonologists.   Ok to continue off acei indefinitely, GERD rx per primary care , pulmonary f/u prn

## 2013-07-06 NOTE — Progress Notes (Signed)
Subjective:    Patient ID: Carrie [name], female    DOB: 1956/08/06  MRN: [id number]    Brief patient profile:  89 yowf never smoker with morbid obesity complicated by aodm/ gastroparesis with new cough onset in April 2014 referred by Dr Doretha Sou 04/02/2013 to pulmonary clinic.  History of Present Illness  04/02/2013 1st pulmonary ov pattern x 10 year of sev times per year typically in winter chest colds/severe "bronchitis" rx abx and prednisone and inhalers in between felt fine and no need for any chronic  meds -  this episode was different from her usual "bronchitis" started in April 2014 more dry, more severe to point of gagging never vomit, coughed to point wherecouldn't breath, better if drink water. Not better with inaler but did not get abx or prednisonethis time. Worst during the day but also present at hs  Wt is near baseline, maybe up about 10 since before the cough. Breathing ok if not coughing, also with urinary incont during cough.  Already tried nexium and pepcid though confused with instructions re which to take when rec Only use the inhaler if you can't catch your breath First take delsym two tsp every 12 hours and supplement if needed with  tramadol 50 mg up to 2 every 4 hours to suppress the urge to cough at all or even clear your throat.  Try nexium 40 mg Take 30-60 min before first meal of the day and Pepcid 40 mg one bedtime  GERD diet  Prednisone 10 mg take  4 each am x 2 days,   2 each am x 2 days,  1 each am x 2 days and stop     /04/2013 f/u ov/Carrie [name] re cough/ on acei (apparently on it at previous ov as well) Chief Complaint  Patient presents with  . Follow-up    Pt states that her cough is 50% improved since her last visit. No new co's today.  >>>stop lisinopril , rx micardis    05/05/2013 Follow up and Med review   new med calendar - pt brought all meds with her today.  reports cough 75% better on the Micardis, but has been taking 1 whole tab.  also, insurance  doesn't cover the Micardis. Reviewed all her medications and organized them into a medication calendar with patient education Says that her cough is much improved. She denies any hemoptysis, orthopnea, PND, or leg swelling. No discolored mucus. rec Follow med calendar closely and bring to each visit.  Avoid ACE Inhibitors in future as they can cause a cough.  May take Losartan 78m daily (in place of micardis)   07/06/2013 f/u ov/Carrie [name] re: ? Acei cough  Chief Complaint  Patient presents with  . Follow-up    Cough has pretty much resolved. No need for tramadol at this time. No new co's today.    no need for saba hfa or neb, not limited breathing - was having refractory gerd symptoms also but no longer since d/c acei  No obvious day to day or daytime variabilty or assoc chronic cough or cp or chest tightness, subjective wheeze overt sinus or hb symptoms. No unusual exp hx or h/o childhood pna/ asthma or knowledge of premature birth.  Sleeping ok without nocturnal  or early am exacerbation  of respiratory  c/o's or need for noct saba. Also denies any obvious fluctuation of symptoms with weather or environmental changes or other aggravating or alleviating factors except as outlined above   Current Medications, Allergies,  Complete Past Medical History, Past Surgical History, Family History, and Social History were reviewed in [employer] record.  ROS  The following are not active complaints unless bolded sore throat, dysphagia, dental problems, itching, sneezing,  nasal congestion or excess/ purulent secretions, ear ache,   fever, chills, sweats, unintended wt loss, pleuritic or exertional cp, hemoptysis,  orthopnea pnd or leg swelling, presyncope, palpitations, heartburn, abdominal pain, anorexia, nausea, vomiting, diarrhea  or change in bowel or urinary habits, change in stools or urine, dysuria,hematuria,  rash, arthralgias, visual complaints, headache, numbness weakness or  ataxia or problems with walking or coordination,  change in mood/affect or memory.             Objective:   Physical Exam  04/20/2013   187  >187 05/05/13 > 07/06/2013   184    Edentulous amb wf  nad  HEENT:  Nl  turbinates, and orophanx. Nl external ear canals without cough reflex   NECK :  without JVD/Nodes/TM/ nl carotid upstrokes bilaterally   LUNGS: no acc muscle use, clear to A and P bilaterally without cough on insp or exp maneuvers   CV:  RRR  no s3 or murmur or increase in P2, no edema   ABD:  soft and nontender with nl excursion in the supine position. No bruits or organomegaly, bowel sounds nl  MS:  warm without deformities, calf tenderness, cyanosis or clubbing  SKIN: warm and dry without lesions    NEURO:  alert, approp, no deficits     CXR  04/02/2013 :   1. No acute cardiopulmonary abnormalities       Assessment & Plan:   Outpatient Encounter Prescriptions as of 07/06/2013  Medication Sig Dispense Refill  . adalimumab (HUMIRA) 40 MG/0.8ML injection Inject 40 mg into the skin once a week.      Marland Kitchen albuterol (PROAIR HFA) 108 (90 BASE) MCG/ACT inhaler Inhale 2 puffs into the lungs every 4 (four) hours as needed for wheezing or shortness of breath.      Marland Kitchen albuterol (PROVENTIL) (2.5 MG/3ML) 0.083% nebulizer solution Take 2.5 mg by nebulization every 4 (four) hours as needed for wheezing or shortness of breath.       Marland Kitchen aspirin 81 MG tablet Take 81 mg by mouth daily.        . Blood Glucose Calibration (OT ULTRA/FASTTK CNTRL SOLN) SOLN       . calcium-vitamin D (OSCAL WITH D) 500-200 MG-UNIT per tablet Take 1 tablet by mouth daily.      . Canagliflozin (INVOKANA) 300 MG TABS Take 1 tablet by mouth at bedtime.       Marland Kitchen esomeprazole (NEXIUM) 40 MG capsule Take 1 capsule (40 mg total) by mouth daily before breakfast.      . famotidine (PEPCID) 40 MG tablet Take 1 tablet (40 mg total) by mouth at bedtime.  30 tablet  2  . folic acid (FOLVITE) 1 MG tablet Take 2 mg by  mouth daily.       Marland Kitchen glimepiride (AMARYL) 4 MG tablet One tablet by mouth once daily       . insulin glargine (LANTUS) 100 UNIT/ML injection Inject 50 Units into the skin at bedtime.       Marland Kitchen losartan (COZAAR) 50 MG tablet Take 1 tablet (50 mg total) by mouth daily.  30 tablet  5  . metFORMIN (GLUCOPHAGE) 1000 MG tablet Take 1,000 mg by mouth 2 (two) times daily with a meal.       .  methotrexate (RHEUMATREX) 5 MG tablet Take 2.5 mg by mouth once a week. Caution: Chemotherapy. Protect from light.      . ONE TOUCH ULTRA TEST test strip       . promethazine (PHENERGAN) 25 MG tablet Take 25 mg by mouth every 6 (six) hours as needed. For nausea      . traMADol (ULTRAM) 50 MG tablet TAKE 1-2 TABLETS BY MOUTH EVERY 4 HOURS AS NEEDED FOR PAIN (OR COUGH).  40 tablet  0  . zoledronic acid (RECLAST) 5 MG/100ML SOLN Yearly       Facility-Administered Encounter Medications as of 07/06/2013  Medication Dose Route Frequency Provider Last Rate Last Dose  . 0.9 %  sodium chloride infusion  500 mL Intravenous Continuous Inda Castle, MD      . insulin regular (HUMULIN R,NOVOLIN R) 100 units/mL injection 5 Units  5 Units Subcutaneous Once Inda Castle, MD

## 2013-07-07 ENCOUNTER — Emergency Department (HOSPITAL_COMMUNITY)
Admission: EM | Admit: 2013-07-07 | Discharge: 2013-07-07 | Disposition: A | Discharge status: Home / Self Care | Payer: Medicare PPO | Attending: Emergency Medicine | Admitting: Emergency Medicine

## 2013-07-07 ENCOUNTER — Encounter (HOSPITAL_COMMUNITY): Payer: Self-pay | Admitting: Emergency Medicine

## 2013-07-07 DIAGNOSIS — Z862 Personal history of diseases of the blood and blood-forming organs and certain disorders involving the immune mechanism: Secondary | ICD-10-CM | POA: Insufficient documentation

## 2013-07-07 DIAGNOSIS — I10 Essential (primary) hypertension: Secondary | ICD-10-CM | POA: Insufficient documentation

## 2013-07-07 DIAGNOSIS — M810 Age-related osteoporosis without current pathological fracture: Secondary | ICD-10-CM | POA: Insufficient documentation

## 2013-07-07 DIAGNOSIS — Z7982 Long term (current) use of aspirin: Secondary | ICD-10-CM | POA: Insufficient documentation

## 2013-07-07 DIAGNOSIS — Z79899 Other long term (current) drug therapy: Secondary | ICD-10-CM | POA: Insufficient documentation

## 2013-07-07 DIAGNOSIS — L0390 Cellulitis, unspecified: Secondary | ICD-10-CM

## 2013-07-07 DIAGNOSIS — IMO0002 Reserved for concepts with insufficient information to code with codable children: Secondary | ICD-10-CM | POA: Insufficient documentation

## 2013-07-07 DIAGNOSIS — Z792 Long term (current) use of antibiotics: Secondary | ICD-10-CM | POA: Insufficient documentation

## 2013-07-07 DIAGNOSIS — E119 Type 2 diabetes mellitus without complications: Secondary | ICD-10-CM | POA: Insufficient documentation

## 2013-07-07 DIAGNOSIS — Z794 Long term (current) use of insulin: Secondary | ICD-10-CM | POA: Insufficient documentation

## 2013-07-07 DIAGNOSIS — K219 Gastro-esophageal reflux disease without esophagitis: Secondary | ICD-10-CM | POA: Insufficient documentation

## 2013-07-07 DIAGNOSIS — Z8639 Personal history of other endocrine, nutritional and metabolic disease: Secondary | ICD-10-CM | POA: Insufficient documentation

## 2013-07-07 DIAGNOSIS — F411 Generalized anxiety disorder: Secondary | ICD-10-CM | POA: Insufficient documentation

## 2013-07-07 DIAGNOSIS — F3289 Other specified depressive episodes: Secondary | ICD-10-CM | POA: Insufficient documentation

## 2013-07-07 DIAGNOSIS — J45909 Unspecified asthma, uncomplicated: Secondary | ICD-10-CM | POA: Insufficient documentation

## 2013-07-07 DIAGNOSIS — E669 Obesity, unspecified: Secondary | ICD-10-CM | POA: Insufficient documentation

## 2013-07-07 DIAGNOSIS — Z8673 Personal history of transient ischemic attack (TIA), and cerebral infarction without residual deficits: Secondary | ICD-10-CM | POA: Insufficient documentation

## 2013-07-07 DIAGNOSIS — Z8619 Personal history of other infectious and parasitic diseases: Secondary | ICD-10-CM | POA: Insufficient documentation

## 2013-07-07 DIAGNOSIS — F329 Major depressive disorder, single episode, unspecified: Secondary | ICD-10-CM | POA: Insufficient documentation

## 2013-07-07 DIAGNOSIS — M129 Arthropathy, unspecified: Secondary | ICD-10-CM | POA: Insufficient documentation

## 2013-07-07 MED ORDER — HYDROCODONE-ACETAMINOPHEN 5-325 MG PO TABS: 1.0000 | ORAL_TABLET | Freq: Four times a day (QID) | ORAL | Status: DC | PRN

## 2013-07-07 MED ORDER — CLINDAMYCIN HCL 150 MG PO CAPS: 300.0000 mg | ORAL_CAPSULE | Freq: Three times a day (TID) | ORAL | Status: DC

## 2013-07-07 NOTE — ED Notes (Signed)
Pt presents to ED via POV.  Pt c/o of edemas knot on L back/shoulder area with pain radiating down left arm.  Pt denies fever and cold symptoms.  Pt states pain 8 of 10.

## 2013-07-09 NOTE — ED Provider Notes (Signed)
CSN: [id number]     Arrival date & time 07/07/13  1427 History   First MD Initiated Contact with Patient 07/07/13 1534     Chief Complaint  Patient presents with  . Cyst   (Consider location/radiation/quality/duration/timing/severity/associated sxs/prior Treatment) HPI Comments: Patient with cyst which she burat and drain several days ago on L solder. No with worsening heat, redness, swelling and severe pain. +malaise,  myalgias and chills over night. No fever. Patient is diabetic.  Patient is a 57 y.o. female presenting with abscess.  Abscess Location:  Shoulder/arm Shoulder/arm abscess location:  L shoulder Size:  2 cm are of induration. 10 cm surrounding erythema Abscess quality: induration, painful, redness and warmth   Abscess quality: not draining and no fluctuance   Red streaking: no   Duration:  1 day Progression:  Worsening Pain details:    Quality:  Aching and hot   Severity:  Severe   Progression:  Worsening Context: diabetes   Associated symptoms: no anorexia, no fatigue, no fever, no headaches, no nausea and no vomiting       Past Medical History  Diagnosis Date  . GERD (gastroesophageal reflux disease)   . Gastritis   . Stricture and stenosis of esophagus   . Obesity   . Diabetes mellitus   . Depression   . Asthma   . Hyperlipemia   . Hernia   . Hypertension   . Allergy   . Anxiety   . Osteoporosis   . Tuberculosis     tested positive 2011, no symptoms, was on medicine for 6 months  . TIA (transient ischemic attack)   . Plantar fasciitis   . Arthritis     psoriatic arithritis, DDD   Past Surgical History  Procedure Laterality Date  . Knee surgery      Bilateral  replacement  . Cesarean section      x 2   . Breast surgery    . Joint replacement      bilat knee    Family History  Problem Relation Age of Onset  . Colon cancer Neg Hx   . Diabetes      maternal aunts and uncles  . Diabetes Father   . Diabetes Sister   . Diabetes Brother    . Liver disease Father   . Sarcoidosis Brother   . Emphysema Mother     smoked  . Emphysema Father     smoked  . Asthma Mother   . Asthma Brother    History  Substance Use Topics  . Smoking status: Never Smoker   . Smokeless tobacco: Never Used  . Alcohol Use: No   OB History   Grav Para Term Preterm Abortions TAB SAB Ect Mult Living                 Review of Systems  Constitutional: Negative for fever and fatigue.  Gastrointestinal: Negative for nausea, vomiting and anorexia.  Musculoskeletal: Positive for myalgias. Negative for gait problem.  Skin: Positive for wound.  Neurological: Negative for headaches.    Allergies  Review of patient's allergies indicates no known allergies.  Home Medications   Current Outpatient Rx  Name  Route  Sig  Dispense  Refill  . adalimumab (HUMIRA) 40 MG/0.8ML injection   Subcutaneous   Inject 40 mg into the skin once a week. Sundays.         Marland Kitchen albuterol (PROAIR HFA) 108 (90 BASE) MCG/ACT inhaler   Inhalation   Inhale 2 puffs  into the lungs every 4 (four) hours as needed for wheezing or shortness of breath.         Marland Kitchen albuterol (PROVENTIL) (2.5 MG/3ML) 0.083% nebulizer solution   Nebulization   Take 2.5 mg by nebulization every 4 (four) hours as needed for wheezing or shortness of breath.          Marland Kitchen aspirin EC 81 MG tablet   Oral   Take 81 mg by mouth every morning.         . calcium-vitamin D (OSCAL WITH D) 500-200 MG-UNIT per tablet   Oral   Take 1 tablet by mouth daily.         . Canagliflozin (INVOKANA) 300 MG TABS   Oral   Take 300 mg by mouth at bedtime.          Marland Kitchen esomeprazole (NEXIUM) 40 MG capsule   Oral   Take 1 capsule (40 mg total) by mouth daily before breakfast.         . famotidine (PEPCID) 40 MG tablet   Oral   Take 1 tablet (40 mg total) by mouth at bedtime.   30 tablet   2   . folic acid (FOLVITE) 1 MG tablet   Oral   Take 2 mg by mouth daily.          Marland Kitchen glimepiride (AMARYL) 4  MG tablet   Oral   Take 4 mg by mouth daily before breakfast.          . insulin glargine (LANTUS) 100 UNIT/ML injection   Subcutaneous   Inject 50 Units into the skin at bedtime.          Marland Kitchen losartan (COZAAR) 50 MG tablet   Oral   Take 50 mg by mouth every morning.         . metFORMIN (GLUCOPHAGE) 1000 MG tablet   Oral   Take 1,000 mg by mouth 2 (two) times daily with a meal.          . methotrexate (RHEUMATREX) 2.5 MG tablet   Oral   Take 25 mg by mouth once a week. Caution:Chemotherapy. Protect from light.  Taken on Wednesdays.         . promethazine (PHENERGAN) 25 MG tablet   Oral   Take 25 mg by mouth every 6 (six) hours as needed for nausea.          . traMADol (ULTRAM) 50 MG tablet   Oral   Take 50-100 mg by mouth every 6 (six) hours as needed for pain.         Marland Kitchen zoledronic acid (RECLAST) 5 MG/100ML SOLN   Intravenous   Inject 5 mg into the vein. Yearly         . clindamycin (CLEOCIN) 150 MG capsule   Oral   Take 2 capsules (300 mg total) by mouth 3 (three) times daily.   60 capsule   0   . HYDROcodone-acetaminophen (NORCO) 5-325 MG per tablet   Oral   Take 1-2 tablets by mouth every 6 (six) hours as needed for pain.   20 tablet   0   . ONE TOUCH ULTRA TEST test strip                BP 117/87  Pulse 113  Temp(Src) 98.3 F (36.8 C) (Oral)  Resp 17  Ht 5' (1.524 m)  Wt 180 lb (81.647 kg)  BMI 35.15 kg/m2  SpO2 99% Physical Exam  Constitutional: She  is oriented to person, place, and time. She appears well-developed and well-nourished. No distress.  HENT:  Head: Normocephalic and atraumatic.  Eyes: Conjunctivae are normal. No scleral icterus.  Neck: Normal range of motion.  Cardiovascular: Normal rate, regular rhythm and normal heart sounds.  Exam reveals no gallop and no friction rub.   No murmur heard. Pulmonary/Chest: Effort normal and breath sounds normal. No respiratory distress.  Abdominal: Soft. Bowel sounds are normal. She  exhibits no distension and no mass. There is no tenderness. There is no guarding.  Neurological: She is alert and oriented to person, place, and time.  Skin: Skin is warm and dry. She is not diaphoretic.  2 cm central area of induration. 10 cm surrounding erythema without. No streaking    ED Course  Procedures (including critical care time) Labs Review Labs Reviewed - No data to display Imaging Review No results found.  MDM   1. Cellulitis    BP 117/87  Pulse 113  Temp(Src) 98.3 F (36.8 C) (Oral)  Resp 17  Ht 5' (1.524 m)  Wt 180 lb (81.647 kg)  BMI 35.15 kg/m2  SpO2 99% Patient afebrile, nontoxic. Cellulitis of the upper back. Patient will be started clindamycin and pain meds. Follow up with pcp for wound check.  Erythema marked with pen.  return precautions discussed.    Margarita Mail, PA-C 07/09/13 2135

## 2013-07-11 ENCOUNTER — Encounter (HOSPITAL_COMMUNITY): Payer: Self-pay | Admitting: Emergency Medicine

## 2013-07-11 ENCOUNTER — Emergency Department (HOSPITAL_COMMUNITY)
Admission: EM | Admit: 2013-07-11 | Discharge: 2013-07-11 | Disposition: A | Discharge status: Home / Self Care | Payer: Medicare PPO | Attending: Emergency Medicine | Admitting: Emergency Medicine

## 2013-07-11 DIAGNOSIS — L0390 Cellulitis, unspecified: Secondary | ICD-10-CM

## 2013-07-11 DIAGNOSIS — Z8611 Personal history of tuberculosis: Secondary | ICD-10-CM | POA: Insufficient documentation

## 2013-07-11 DIAGNOSIS — E785 Hyperlipidemia, unspecified: Secondary | ICD-10-CM | POA: Insufficient documentation

## 2013-07-11 DIAGNOSIS — K219 Gastro-esophageal reflux disease without esophagitis: Secondary | ICD-10-CM | POA: Insufficient documentation

## 2013-07-11 DIAGNOSIS — R599 Enlarged lymph nodes, unspecified: Secondary | ICD-10-CM | POA: Insufficient documentation

## 2013-07-11 DIAGNOSIS — Z8739 Personal history of other diseases of the musculoskeletal system and connective tissue: Secondary | ICD-10-CM | POA: Insufficient documentation

## 2013-07-11 DIAGNOSIS — Z8673 Personal history of transient ischemic attack (TIA), and cerebral infarction without residual deficits: Secondary | ICD-10-CM | POA: Insufficient documentation

## 2013-07-11 DIAGNOSIS — L02219 Cutaneous abscess of trunk, unspecified: Secondary | ICD-10-CM | POA: Insufficient documentation

## 2013-07-11 DIAGNOSIS — Z79899 Other long term (current) drug therapy: Secondary | ICD-10-CM | POA: Insufficient documentation

## 2013-07-11 DIAGNOSIS — Z8659 Personal history of other mental and behavioral disorders: Secondary | ICD-10-CM | POA: Insufficient documentation

## 2013-07-11 DIAGNOSIS — J45909 Unspecified asthma, uncomplicated: Secondary | ICD-10-CM | POA: Insufficient documentation

## 2013-07-11 DIAGNOSIS — E669 Obesity, unspecified: Secondary | ICD-10-CM | POA: Insufficient documentation

## 2013-07-11 DIAGNOSIS — Z8719 Personal history of other diseases of the digestive system: Secondary | ICD-10-CM | POA: Insufficient documentation

## 2013-07-11 DIAGNOSIS — R509 Fever, unspecified: Secondary | ICD-10-CM | POA: Insufficient documentation

## 2013-07-11 DIAGNOSIS — R21 Rash and other nonspecific skin eruption: Secondary | ICD-10-CM | POA: Insufficient documentation

## 2013-07-11 DIAGNOSIS — Z7982 Long term (current) use of aspirin: Secondary | ICD-10-CM | POA: Insufficient documentation

## 2013-07-11 DIAGNOSIS — Z872 Personal history of diseases of the skin and subcutaneous tissue: Secondary | ICD-10-CM | POA: Insufficient documentation

## 2013-07-11 DIAGNOSIS — E119 Type 2 diabetes mellitus without complications: Secondary | ICD-10-CM | POA: Insufficient documentation

## 2013-07-11 DIAGNOSIS — I10 Essential (primary) hypertension: Secondary | ICD-10-CM | POA: Insufficient documentation

## 2013-07-11 DIAGNOSIS — Z794 Long term (current) use of insulin: Secondary | ICD-10-CM | POA: Insufficient documentation

## 2013-07-11 MED ORDER — HYDROCODONE-ACETAMINOPHEN 5-325 MG PO TABS: 1.0000 | ORAL_TABLET | Freq: Four times a day (QID) | ORAL | Status: DC | PRN

## 2013-07-11 NOTE — ED Provider Notes (Signed)
I saw and evaluated the patient, reviewed the resident's note and I agree with the findings and plan.  Patient here with abscess vs cellulitis. Improved from permanent marker border drawn previously. Bedside US with a few small scattered  pockets of fluid, no true collection. With improvement on PO antibiotics, will continue and instruct to f/u for another wound check in 2 days.  Osvaldo Shipper, MD 07/11/13 [phone]

## 2013-07-11 NOTE — ED Provider Notes (Signed)
CSN: [id number]     Arrival date & time 07/11/13  0545 History   First MD Initiated Contact with Patient 07/11/13 0701     Chief Complaint  Patient presents with  . Abscess   Patient is a 57 y.o. female presenting with abscess.  Abscess Associated symptoms: fever   Associated symptoms: no anorexia, no fatigue, no headaches, no nausea and no vomiting    Pt is a 57 y/o Female who was recently seen in the ED for cellulitis on 07/07/13, 4 days ago, for L upper scapular celllulitis.  Pt was initially put on Norco and clindamycin at that time to tx her infection and the area was outlined as well.  Since that time, she has noticed that the area has culminated in increased elevation and pain.  Continues to have subjective and objective fevers to 101-102 yesterday, which are unchanged from previous in the week.  She continues to take norco and advil for the pain/fever, which does improve both somewhat.  Has noticed another "lump" slightly medial and superior from the area.    Past Medical History  Diagnosis Date  . GERD (gastroesophageal reflux disease)   . Gastritis   . Stricture and stenosis of esophagus   . Obesity   . Diabetes mellitus   . Depression   . Asthma   . Hyperlipemia   . Hernia   . Hypertension   . Allergy   . Anxiety   . Osteoporosis   . Tuberculosis     tested positive 2011, no symptoms, was on medicine for 6 months  . TIA (transient ischemic attack)   . Plantar fasciitis   . Arthritis     psoriatic arithritis, DDD   Past Surgical History  Procedure Laterality Date  . Knee surgery      Bilateral  replacement  . Cesarean section      x 2   . Breast surgery    . Joint replacement      bilat knee    Family History  Problem Relation Age of Onset  . Colon cancer Neg Hx   . Diabetes      maternal aunts and uncles  . Diabetes Father   . Diabetes Sister   . Diabetes Brother   . Liver disease Father   . Sarcoidosis Brother   . Emphysema Mother     smoked  .  Emphysema Father     smoked  . Asthma Mother   . Asthma Brother    History  Substance Use Topics  . Smoking status: Never Smoker   . Smokeless tobacco: Never Used  . Alcohol Use: No   OB History   Grav Para Term Preterm Abortions TAB SAB Ect Mult Living                 Review of Systems  Constitutional: Positive for fever. Negative for chills, diaphoresis and fatigue.  HENT: Negative.   Respiratory: Negative.   Cardiovascular: Negative.   Gastrointestinal: Negative for nausea, vomiting and anorexia.  Musculoskeletal: Negative for myalgias and gait problem.  Skin: Positive for color change, rash and wound.  Neurological: Negative for headaches.  Hematological: Positive for adenopathy.  All other systems reviewed and are negative.    Allergies  Review of patient's allergies indicates no known allergies.  Home Medications   Current Outpatient Rx  Name  Route  Sig  Dispense  Refill  . adalimumab (HUMIRA) 40 MG/0.8ML injection   Subcutaneous   Inject 40 mg into  the skin once a week. Sundays.         Marland Kitchen aspirin EC 81 MG tablet   Oral   Take 81 mg by mouth every morning.         . calcium-vitamin D (OSCAL WITH D) 500-200 MG-UNIT per tablet   Oral   Take 1 tablet by mouth every morning.          . Canagliflozin (INVOKANA) 300 MG TABS   Oral   Take 300 mg by mouth at bedtime.          . clindamycin (CLEOCIN) 150 MG capsule   Oral   Take 2 capsules (300 mg total) by mouth 3 (three) times daily.   60 capsule   0   . esomeprazole (NEXIUM) 40 MG capsule   Oral   Take 1 capsule (40 mg total) by mouth daily before breakfast.         . famotidine (PEPCID) 40 MG tablet   Oral   Take 1 tablet (40 mg total) by mouth at bedtime.   30 tablet   2   . folic acid (FOLVITE) 1 MG tablet   Oral   Take 2 mg by mouth every morning.          Marland Kitchen glimepiride (AMARYL) 4 MG tablet   Oral   Take 4 mg by mouth daily before breakfast.          .  HYDROcodone-acetaminophen (NORCO) 5-325 MG per tablet   Oral   Take 1-2 tablets by mouth every 6 (six) hours as needed for pain.   20 tablet   0   . insulin glargine (LANTUS) 100 UNIT/ML injection   Subcutaneous   Inject 50 Units into the skin at bedtime.          Marland Kitchen losartan (COZAAR) 50 MG tablet   Oral   Take 50 mg by mouth every morning.         . metFORMIN (GLUCOPHAGE) 1000 MG tablet   Oral   Take 1,000 mg by mouth 2 (two) times daily with a meal.          . methotrexate (RHEUMATREX) 2.5 MG tablet   Oral   Take 25 mg by mouth once a week. Caution:Chemotherapy. Protect from light.  Taken on Wednesdays.         . traMADol (ULTRAM) 50 MG tablet   Oral   Take 50-100 mg by mouth every 6 (six) hours as needed for pain.         Marland Kitchen albuterol (PROAIR HFA) 108 (90 BASE) MCG/ACT inhaler   Inhalation   Inhale 2 puffs into the lungs every 4 (four) hours as needed for wheezing or shortness of breath.         Marland Kitchen albuterol (PROVENTIL) (2.5 MG/3ML) 0.083% nebulizer solution   Nebulization   Take 2.5 mg by nebulization every 4 (four) hours as needed for wheezing or shortness of breath.          . zoledronic acid (RECLAST) 5 MG/100ML SOLN   Intravenous   Inject 5 mg into the vein. Yearly          BP 136/78  Pulse 85  Temp(Src) 98.3 F (36.8 C) (Oral)  Resp 16  Ht 5' (1.524 m)  Wt 179 lb (81.194 kg)  BMI 34.96 kg/m2  SpO2 96% Physical Exam  Constitutional: She is oriented to person, place, and time. She appears well-developed and well-nourished. No distress.  HENT:  Head: Normocephalic and  atraumatic.  Eyes: Conjunctivae and EOM are normal. No scleral icterus.  Neck: Normal range of motion.  Cardiovascular: Normal rate, regular rhythm and normal heart sounds.  Exam reveals no gallop and no friction rub.   No murmur heard. Pulmonary/Chest: Effort normal and breath sounds normal. No respiratory distress.  Abdominal: Soft. Bowel sounds are normal. She exhibits no  distension and no mass. There is no tenderness. There is no guarding.  Lymphadenopathy:  + posterior scapular LAD on left side, medial border   Neurological: She is alert and oriented to person, place, and time.  Skin: Skin is warm and dry. She is not diaphoretic.  4 cm central area of induration. No surrounding erythema. No streaking    ED Course  Procedures (including critical care time) Labs Review Labs Reviewed - No data to display Imaging Review No results found.  MDM   1. Cellulitis    Pt cellulitis starting to become more indurated and the previous outlined area has shrunk substantially as well as developing tender lymphadenopathy proximal to the area.  Will consider lidocaine injection into the area with needle aspiration to see if pustular fluid can be extracted at this time and culture.  However, do not feel this can be I&D at this time.  Continue clindamycin as she is receiving a favored response and will re-outline the area today.    7:30 AM - Evaluated under [place] for possible pocket of fluctuant material for I&D.  However, did not observe a frank area that would be amenable to this.  There are mutliple 1-2 mm hypoechoic area that are deep and non fluctuant on [place] that may represent inflammation of the tissue vs small walled off pockets of material.  Will have her f/u with her PCP w/in the next 3-4 days to evaluate for possible induration with obvious area of walled off fluid for I&D.  F/U instructions and return precautions outlined for pt in d/c instructions and will continue with clindamycin and will refill her vicodin x 15 pills today.   Tamela Oddi Awanda Mink, DO of [facility] 07/11/2013, 7:32 AM      Nolon Rod, DO 07/11/13 1423

## 2013-07-11 NOTE — ED Notes (Signed)
Pt states she was recently dx with cellulitis on upper back, pt states pain worse with a new raised area.

## 2013-07-13 NOTE — ED Provider Notes (Signed)
Medical screening examination/treatment/procedure(s) were performed by non-physician practitioner and as supervising physician I was immediately available for consultation/collaboration.  Orlie Dakin, MD 07/13/13 [phone]

## 2013-09-11 DIAGNOSIS — B370 Candidal stomatitis: Secondary | ICD-10-CM | POA: Insufficient documentation

## 2013-09-11 DIAGNOSIS — J029 Acute pharyngitis, unspecified: Secondary | ICD-10-CM | POA: Insufficient documentation

## 2013-09-16 ENCOUNTER — Emergency Department (HOSPITAL_COMMUNITY): Payer: Medicare PPO

## 2013-09-16 ENCOUNTER — Encounter (HOSPITAL_COMMUNITY): Payer: Self-pay | Admitting: Emergency Medicine

## 2013-09-16 ENCOUNTER — Emergency Department (HOSPITAL_COMMUNITY)
Admission: EM | Admit: 2013-09-16 | Discharge: 2013-09-16 | Disposition: A | Discharge status: Home / Self Care | Payer: Medicare PPO | Attending: Emergency Medicine | Admitting: Emergency Medicine

## 2013-09-16 DIAGNOSIS — F3289 Other specified depressive episodes: Secondary | ICD-10-CM | POA: Insufficient documentation

## 2013-09-16 DIAGNOSIS — Z79899 Other long term (current) drug therapy: Secondary | ICD-10-CM | POA: Insufficient documentation

## 2013-09-16 DIAGNOSIS — E871 Hypo-osmolality and hyponatremia: Secondary | ICD-10-CM | POA: Insufficient documentation

## 2013-09-16 DIAGNOSIS — R52 Pain, unspecified: Secondary | ICD-10-CM | POA: Insufficient documentation

## 2013-09-16 DIAGNOSIS — Z8673 Personal history of transient ischemic attack (TIA), and cerebral infarction without residual deficits: Secondary | ICD-10-CM | POA: Insufficient documentation

## 2013-09-16 DIAGNOSIS — R0789 Other chest pain: Secondary | ICD-10-CM | POA: Insufficient documentation

## 2013-09-16 DIAGNOSIS — R51 Headache: Secondary | ICD-10-CM | POA: Insufficient documentation

## 2013-09-16 DIAGNOSIS — E119 Type 2 diabetes mellitus without complications: Secondary | ICD-10-CM | POA: Insufficient documentation

## 2013-09-16 DIAGNOSIS — Z7982 Long term (current) use of aspirin: Secondary | ICD-10-CM | POA: Insufficient documentation

## 2013-09-16 DIAGNOSIS — I10 Essential (primary) hypertension: Secondary | ICD-10-CM | POA: Insufficient documentation

## 2013-09-16 DIAGNOSIS — K219 Gastro-esophageal reflux disease without esophagitis: Secondary | ICD-10-CM | POA: Insufficient documentation

## 2013-09-16 DIAGNOSIS — E669 Obesity, unspecified: Secondary | ICD-10-CM | POA: Insufficient documentation

## 2013-09-16 DIAGNOSIS — R000 Tachycardia, unspecified: Secondary | ICD-10-CM | POA: Insufficient documentation

## 2013-09-16 DIAGNOSIS — R509 Fever, unspecified: Secondary | ICD-10-CM | POA: Insufficient documentation

## 2013-09-16 DIAGNOSIS — Z8611 Personal history of tuberculosis: Secondary | ICD-10-CM | POA: Insufficient documentation

## 2013-09-16 DIAGNOSIS — K644 Residual hemorrhoidal skin tags: Secondary | ICD-10-CM | POA: Insufficient documentation

## 2013-09-16 DIAGNOSIS — K529 Noninfective gastroenteritis and colitis, unspecified: Secondary | ICD-10-CM

## 2013-09-16 DIAGNOSIS — M810 Age-related osteoporosis without current pathological fracture: Secondary | ICD-10-CM | POA: Insufficient documentation

## 2013-09-16 DIAGNOSIS — R739 Hyperglycemia, unspecified: Secondary | ICD-10-CM

## 2013-09-16 DIAGNOSIS — K649 Unspecified hemorrhoids: Secondary | ICD-10-CM

## 2013-09-16 DIAGNOSIS — K648 Other hemorrhoids: Secondary | ICD-10-CM | POA: Insufficient documentation

## 2013-09-16 DIAGNOSIS — F329 Major depressive disorder, single episode, unspecified: Secondary | ICD-10-CM | POA: Insufficient documentation

## 2013-09-16 DIAGNOSIS — F411 Generalized anxiety disorder: Secondary | ICD-10-CM | POA: Insufficient documentation

## 2013-09-16 DIAGNOSIS — E860 Dehydration: Secondary | ICD-10-CM | POA: Insufficient documentation

## 2013-09-16 DIAGNOSIS — R319 Hematuria, unspecified: Secondary | ICD-10-CM | POA: Insufficient documentation

## 2013-09-16 DIAGNOSIS — N179 Acute kidney failure, unspecified: Secondary | ICD-10-CM

## 2013-09-16 DIAGNOSIS — J45901 Unspecified asthma with (acute) exacerbation: Secondary | ICD-10-CM | POA: Insufficient documentation

## 2013-09-16 DIAGNOSIS — Z872 Personal history of diseases of the skin and subcutaneous tissue: Secondary | ICD-10-CM | POA: Insufficient documentation

## 2013-09-16 DIAGNOSIS — K5289 Other specified noninfective gastroenteritis and colitis: Secondary | ICD-10-CM | POA: Insufficient documentation

## 2013-09-16 DIAGNOSIS — Z794 Long term (current) use of insulin: Secondary | ICD-10-CM | POA: Insufficient documentation

## 2013-09-16 DIAGNOSIS — IMO0001 Reserved for inherently not codable concepts without codable children: Secondary | ICD-10-CM | POA: Insufficient documentation

## 2013-09-16 LAB — CBC WITH DIFFERENTIAL/PLATELET
Basophils Absolute: 0 10*3/uL (ref 0.0–0.1)
Basophils Relative: 0 % (ref 0–1)
Eosinophils Absolute: 0 10*3/uL (ref 0.0–0.7)
Eosinophils Relative: 0 % (ref 0–5)
Hemoglobin: 15.8 g/dL — ABNORMAL HIGH (ref 12.0–15.0)
Lymphocytes Relative: 24 % (ref 12–46)
Lymphs Abs: 2.1 10*3/uL (ref 0.7–4.0)
MCH: 29.2 pg (ref 26.0–34.0)
MCHC: 34.5 g/dL (ref 30.0–36.0)
MCV: 84.7 fL (ref 78.0–100.0)
Monocytes Relative: 12 % (ref 3–12)
Neutro Abs: 5.5 10*3/uL (ref 1.7–7.7)
Neutrophils Relative %: 63 % (ref 43–77)
Platelets: 159 10*3/uL (ref 150–400)
RBC: 5.41 MIL/uL — ABNORMAL HIGH (ref 3.87–5.11)
WBC: 8.8 10*3/uL (ref 4.0–10.5)

## 2013-09-16 LAB — URINALYSIS, ROUTINE W REFLEX MICROSCOPIC
Bilirubin Urine: NEGATIVE
Glucose, UA: >1000 mg/dL — AB
Ketones, ur: NEGATIVE mg/dL
Protein, ur: 30 mg/dL — AB
pH: 6 (ref 5.0–8.0)

## 2013-09-16 LAB — COMPREHENSIVE METABOLIC PANEL
ALT: 25 U/L (ref 0–35)
Alkaline Phosphatase: 111 U/L (ref 39–117)
BUN: 20 mg/dL (ref 6–23)
CO2: 21 mEq/L (ref 19–32)
GFR calc Af Amer: 56 mL/min — ABNORMAL LOW (ref >90)
GFR calc non Af Amer: 49 mL/min — ABNORMAL LOW (ref >90)
Glucose, Bld: 202 mg/dL — ABNORMAL HIGH (ref 70–99)
Potassium: 3.6 mEq/L (ref 3.5–5.1)
Sodium: 132 mEq/L — ABNORMAL LOW (ref 135–145)
Total Protein: 8.7 g/dL — ABNORMAL HIGH (ref 6.0–8.3)

## 2013-09-16 LAB — URINE MICROSCOPIC-ADD ON

## 2013-09-16 LAB — LIPASE, BLOOD: Lipase: 26 U/L (ref 11–59)

## 2013-09-16 MED ORDER — HYDROMORPHONE HCL PF 1 MG/ML IJ SOLN
0.5000 mg | Freq: Once | INTRAMUSCULAR | Status: AC
  Administered 2013-09-16: 0.5 mg via INTRAVENOUS
  Filled 2013-09-16: qty 1

## 2013-09-16 MED ORDER — IOHEXOL 300 MG/ML  SOLN
100.0000 mL | Freq: Once | INTRAMUSCULAR | Status: AC | PRN
  Administered 2013-09-16: 100 mL via INTRAVENOUS

## 2013-09-16 MED ORDER — SODIUM CHLORIDE 0.9 % IV BOLUS (SEPSIS)
1000.0000 mL | Freq: Once | INTRAVENOUS | Status: AC
  Administered 2013-09-16: 1000 mL via INTRAVENOUS

## 2013-09-16 MED ORDER — PROMETHAZINE HCL 25 MG RE SUPP: 25.0000 mg | Freq: Four times a day (QID) | RECTAL | Status: DC | PRN

## 2013-09-16 MED ORDER — MORPHINE SULFATE 4 MG/ML IJ SOLN
4.0000 mg | Freq: Once | INTRAMUSCULAR | Status: AC
  Administered 2013-09-16: 4 mg via INTRAVENOUS
  Filled 2013-09-16: qty 1

## 2013-09-16 MED ORDER — ONDANSETRON 4 MG PO TBDP: 4.0000 mg | ORAL_TABLET | Freq: Three times a day (TID) | ORAL | Status: DC | PRN

## 2013-09-16 MED ORDER — PROMETHAZINE HCL 25 MG/ML IJ SOLN
12.5000 mg | Freq: Four times a day (QID) | INTRAMUSCULAR | Status: DC | PRN
  Filled 2013-09-16: qty 1

## 2013-09-16 MED ORDER — IOHEXOL 300 MG/ML  SOLN
50.0000 mL | Freq: Once | INTRAMUSCULAR | Status: AC | PRN
  Administered 2013-09-16: 50 mL via ORAL

## 2013-09-16 MED ORDER — ONDANSETRON HCL 4 MG/2ML IJ SOLN
4.0000 mg | Freq: Once | INTRAMUSCULAR | Status: AC
Start: 2013-09-16 — End: 2013-09-16
  Administered 2013-09-16: 4 mg via INTRAVENOUS
  Filled 2013-09-16: qty 2

## 2013-09-16 MED ORDER — HYDROCODONE-ACETAMINOPHEN 5-325 MG PO TABS: 2.0000 | ORAL_TABLET | ORAL | Status: DC | PRN

## 2013-09-16 NOTE — ED Provider Notes (Signed)
CSN: [id number]     Arrival date & time 09/16/13  1645 History   First MD Initiated Contact with Patient 09/16/13 1650     Chief Complaint  Patient presents with  . nausea,vomiting, diarrhea   . Generalized Body Aches   (Consider location/radiation/quality/duration/timing/severity/associated sxs/prior Treatment) HPI Comments: The patient is a 57 year-old female with a past medical history of GERD, HTN, DM, Asthma, presenting the Emergency Department with multiple complaints of vomiting diarrhea, black stools, and chest pain. She reports multiple episodes of non-bloody vomiting since Sunday night.  She complains of dark stools/diarrhea with out frank blood since Sunday night, and recent use of pepto.  She reports an associated right-sided headache since Sunday.  The pateint states she was evaluated by her PCP on Friday and was diagnosed with bronchitis, and thrush and was started on medication.    The history is provided by the patient and medical records. No language interpreter was used.    Past Medical History  Diagnosis Date  . GERD (gastroesophageal reflux disease)   . Gastritis   . Stricture and stenosis of esophagus   . Obesity   . Diabetes mellitus   . Depression   . Asthma   . Hyperlipemia   . Hernia   . Hypertension   . Allergy   . Anxiety   . Osteoporosis   . Tuberculosis     tested positive 2011, no symptoms, was on medicine for 6 months  . TIA (transient ischemic attack)   . Plantar fasciitis   . Arthritis     psoriatic arithritis, DDD   Past Surgical History  Procedure Laterality Date  . Knee surgery      Bilateral  replacement  . Cesarean section      x 2   . Breast surgery    . Joint replacement      bilat knee    Family History  Problem Relation Age of Onset  . Colon cancer Neg Hx   . Diabetes      maternal aunts and uncles  . Diabetes Father   . Diabetes Sister   . Diabetes Brother   . Liver disease Father   . Sarcoidosis Brother   .  Emphysema Mother     smoked  . Emphysema Father     smoked  . Asthma Mother   . Asthma Brother    History  Substance Use Topics  . Smoking status: Never Smoker   . Smokeless tobacco: Never Used  . Alcohol Use: No   OB History   Grav Para Term Preterm Abortions TAB SAB Ect Mult Living                 Review of Systems  Constitutional: Positive for fever and chills.  Respiratory: Positive for cough, chest tightness and shortness of breath.   Cardiovascular: Negative for leg swelling.  Gastrointestinal: Positive for nausea, vomiting, abdominal pain, diarrhea and blood in stool.  Genitourinary: Negative for dysuria and hematuria.  Musculoskeletal: Positive for myalgias.  Skin: Negative for rash.  Neurological: Positive for headaches. Negative for speech difficulty and light-headedness.  All other systems reviewed and are negative.    Allergies  Review of patient's allergies indicates no known allergies.  Home Medications   Current Outpatient Rx  Name  Route  Sig  Dispense  Refill  . adalimumab (HUMIRA) 40 MG/0.8ML injection   Subcutaneous   Inject 40 mg into the skin once a week. Sundays.         Marland Kitchen  albuterol (PROAIR HFA) 108 (90 BASE) MCG/ACT inhaler   Inhalation   Inhale 2 puffs into the lungs every 4 (four) hours as needed for wheezing or shortness of breath.         Marland Kitchen albuterol (PROVENTIL) (2.5 MG/3ML) 0.083% nebulizer solution   Nebulization   Take 2.5 mg by nebulization every 4 (four) hours as needed for wheezing or shortness of breath.          Marland Kitchen aspirin EC 81 MG tablet   Oral   Take 81 mg by mouth every morning.         . benzonatate (TESSALON) 100 MG capsule   Oral   Take 100 mg by mouth every 6 (six) hours as needed for cough.         . calcium-vitamin D (OSCAL WITH D) 500-200 MG-UNIT per tablet   Oral   Take 1 tablet by mouth every morning.          . Canagliflozin (INVOKANA) 300 MG TABS   Oral   Take 300 mg by mouth at bedtime.           Marland Kitchen esomeprazole (NEXIUM) 40 MG capsule   Oral   Take 40 mg by mouth at bedtime.         . famotidine (PEPCID) 40 MG tablet   Oral   Take 1 tablet (40 mg total) by mouth at bedtime.   30 tablet   2   . fluconazole (DIFLUCAN) 150 MG tablet   Oral   Take 150 mg by mouth daily. 14 days starting 09/11/13         . folic acid (FOLVITE) 1 MG tablet   Oral   Take 1 mg by mouth every morning.          Marland Kitchen glimepiride (AMARYL) 4 MG tablet   Oral   Take 4 mg by mouth daily before breakfast.          . insulin glargine (LANTUS) 100 UNIT/ML injection   Subcutaneous   Inject 50 Units into the skin at bedtime.          Marland Kitchen losartan (COZAAR) 50 MG tablet   Oral   Take 50 mg by mouth every morning.         . metFORMIN (GLUCOPHAGE) 1000 MG tablet   Oral   Take 1,000 mg by mouth 2 (two) times daily with a meal.          . methotrexate (RHEUMATREX) 2.5 MG tablet   Oral   Take 25 mg by mouth once a week. Caution:Chemotherapy. Protect from light.  Taken on Wednesdays.         Marland Kitchen nystatin (MYCOSTATIN) 100000 UNIT/ML suspension   Oral   Take 5 mLs by mouth See admin instructions. Place 1 ml to inside of each cheek and on tongue four times daily.         . traMADol (ULTRAM) 50 MG tablet   Oral   Take 50-100 mg by mouth every 6 (six) hours as needed for pain.         Marland Kitchen zoledronic acid (RECLAST) 5 MG/100ML SOLN   Intravenous   Inject 5 mg into the vein. Received yearly dose 06/11/13         . HYDROcodone-acetaminophen (NORCO/VICODIN) 5-325 MG per tablet   Oral   Take 2 tablets by mouth every 4 (four) hours as needed.   10 tablet   0   . ondansetron (ZOFRAN ODT) 4 MG disintegrating  tablet   Oral   Take 1 tablet (4 mg total) by mouth every 8 (eight) hours as needed for nausea or vomiting.   20 tablet   0   . promethazine (PROMETHEGAN) 25 MG suppository   Rectal   Place 1 suppository (25 mg total) rectally every 6 (six) hours as needed for nausea or vomiting.    12 each   0    BP 99/59  Pulse 97  Temp(Src) 98.5 F (36.9 C) (Oral)  Resp 18  SpO2 93% Physical Exam  Nursing note and vitals reviewed. Constitutional: She is oriented to person, place, and time. She appears well-developed and well-nourished.  Appears uncomfortable  HENT:  Head: Normocephalic and atraumatic.  Mouth/Throat: Uvula is midline. Mucous membranes are dry. No oropharyngeal exudate, posterior oropharyngeal edema or posterior oropharyngeal erythema.  Eyes: EOM are normal. Pupils are equal, round, and reactive to light. No scleral icterus.  Neck: Neck supple.  Cardiovascular: Regular rhythm.  Tachycardia present.   No lower extremity edema  Pulmonary/Chest: Breath sounds normal. No stridor. She has no wheezes. She has no rales.  Abdominal: Soft. Normal appearance and bowel sounds are normal. There is tenderness. There is no rebound, no guarding, no tenderness at McBurney's point and negative Murphy's sign.    Abdominal exam limited by patient's body habitus.    Genitourinary: Rectal exam shows external hemorrhoid and internal hemorrhoid. Guaiac positive stool.  No stool in vault.  Minimal amount of pink blood per DRE.  Neurological: She is alert and oriented to person, place, and time. No cranial nerve deficit.  Skin: Skin is warm and dry. No rash noted.  Psychiatric: She has a normal mood and affect.    ED Course  Procedures (including critical care time) Labs Review Labs Reviewed  CBC WITH DIFFERENTIAL - Abnormal; Notable for the following:    RBC 5.41 (*)    Hemoglobin 15.8 (*)    Monocytes Absolute 1.1 (*)    All other components within normal limits  COMPREHENSIVE METABOLIC PANEL - Abnormal; Notable for the following:    Sodium 132 (*)    Glucose, Bld 202 (*)    Creatinine, Ser 1.22 (*)    Total Protein 8.7 (*)    AST 46 (*)    GFR calc non Af Amer 49 (*)    GFR calc Af Amer 56 (*)    All other components within normal limits  URINALYSIS, ROUTINE W  REFLEX MICROSCOPIC - Abnormal; Notable for the following:    APPearance CLOUDY (*)    Specific Gravity, Urine >1.046 (*)    Glucose, UA >1000 (*)    Hgb urine dipstick LARGE (*)    Protein, ur 30 (*)    Leukocytes, UA TRACE (*)    All other components within normal limits  URINE MICROSCOPIC-ADD ON - Abnormal; Notable for the following:    Bacteria, UA FEW (*)    All other components within normal limits  OCCULT BLOOD, POC DEVICE - Abnormal; Notable for the following:    Fecal Occult Bld POSITIVE (*)    All other components within normal limits  URINE CULTURE  LIPASE, BLOOD  POCT I-STAT TROPONIN I   Imaging Review No results found.  EKG Interpretation    Date/Time:  Wednesday September 16 2013 17:44:17 EST Ventricular Rate:  118 PR Interval:  151 QRS Duration: 76 QT Interval:  313 QTC Calculation: 438 R Axis:   15 Text Interpretation:  Sinus tachycardia No acute findings Confirmed by ZAVITZ  MD, JOSHUA [phone]) on 09/16/2013 5:47:07 PM Also confirmed by Reather Converse  MD, JOSHUA ([id number]), editor [place]  CCT, BEVERLY ([id number])  on 09/17/2013 9:50:44 AM            MDM   1. Gastroenteritis   2. Hyperglycemia   3. Hematuria   4. Hemorrhoid   5. Hyponatremia   6. Dehydration   7. Acute kidney injury    Pt presents with n/v/d for several days, tachycardia, moderate abdominal pain.  UA to rule out infection, troponin ordered due to tachycardia, Labs, pain medication, fluids, medication given.  Discussed patient history, condition, and labs with Dr. Reather Converse, after his evaluation of the patient who agrees on the current work-up of the patient.  Patient reports nausea has improved complains of abdominal pain. Discussed patient history, condition, and labs with Dr. Reather Converse who agrees the patient can be evaluated as an out-pt. Pt's HR 90's reports nausea and pain have improved. Discussed lab results, imaging results, and treatment plan with the patient.  She reports understanding and no  other concerns at this time. Patient is stable for discharge at this time. Meds given in ED:  Medications  sodium chloride 0.9 % bolus 1,000 mL (0 mLs Intravenous Stopped 09/16/13 1924)  morphine 4 MG/ML injection 4 mg (4 mg Intravenous Given 09/16/13 1816)  ondansetron (ZOFRAN) injection 4 mg (4 mg Intravenous Given 09/16/13 1816)  iohexol (OMNIPAQUE) 300 MG/ML solution 50 mL (50 mLs Oral Contrast Given 09/16/13 1908)  HYDROmorphone (DILAUDID) injection 0.5 mg (0.5 mg Intravenous Given 09/16/13 2001)  sodium chloride 0.9 % bolus 1,000 mL (0 mLs Intravenous Stopped 09/16/13 2137)  iohexol (OMNIPAQUE) 300 MG/ML solution 100 mL (100 mLs Intravenous Contrast Given 09/16/13 1948)    Discharge Medication List as of 09/16/2013  9:28 PM    START taking these medications   Details  HYDROcodone-acetaminophen (NORCO/VICODIN) 5-325 MG per tablet Take 2 tablets by mouth every 4 (four) hours as needed., Starting 09/16/2013, Until Discontinued, Print    ondansetron (ZOFRAN ODT) 4 MG disintegrating tablet Take 1 tablet (4 mg total) by mouth every 8 (eight) hours as needed for nausea or vomiting., Starting 09/16/2013, Until Discontinued, Print    promethazine (PROMETHEGAN) 25 MG suppository Place 1 suppository (25 mg total) rectally every 6 (six) hours as needed for nausea or vomiting., Starting 09/16/2013, Until Discontinued, Print             Lorrine Kin, PA-C 09/19/13 0122  Lorrine Kin, PA-C 09/19/13 0136

## 2013-09-16 NOTE — ED Notes (Signed)
Per EMS pt coming from home with c/o n/v/d/ , generalized body aches since Sunday night. Per EMS pt also reports black stool.

## 2013-09-16 NOTE — ED Notes (Signed)
Bed: WA20 Expected date:  Expected time:  Means of arrival:  Comments: EMS 

## 2013-09-18 LAB — URINE CULTURE
Colony Count: NO GROWTH
Culture: NO GROWTH

## 2013-09-19 NOTE — ED Provider Notes (Signed)
Medical screening examination/treatment/procedure(s) were conducted as a shared visit with non-physician practitioner(s) or resident and myself. I personally evaluated the patient during the encounter and agree with the findings and plan unless otherwise indicated.  I have personally reviewed any xrays and/ or EKG's with the provider and I agree with interpretation.  Worsening n/v/ abd pain on right side since Sunday. Different than typical epig pain/ gastroperesis. Exam right sided abd pain, no guarding. Dry mm, tachy, no leg swelling or pain, no distress, lungs clear. Plan for labs, fluids, pain meds. Concern for partial sbo vs GE vs colitis vs less likely appy. Xray possible ileus and with right sided pain CT ordered, radiology approved contrast. CT pending. CT no acute findings. Ct Abdomen Pelvis W Contrast  09/16/2013   CLINICAL DATA:  Abdominal pain, possible bowel obstruction  EXAM: CT ABDOMEN AND PELVIS WITH CONTRAST  TECHNIQUE: Multidetector CT imaging of the abdomen and pelvis was performed using the standard protocol following bolus administration of intravenous contrast.  CONTRAST:  162m OMNIPAQUE IOHEXOL 300 MG/ML  SOLN  COMPARISON:  01/13/2013  FINDINGS: Lung bases are unremarkable. Sagittal images of the spine are unremarkable. Fatty infiltration of the liver. No calcified gallstones are noted within gallbladder. Atherosclerotic calcifications of abdominal aorta, SMA origin and bilateral iliac arteries are noted. No aortic aneurysm.  Pancreas, spleen and adrenal glands are unremarkable. Kidneys are symmetrical in size and enhancement. No hydronephrosis or hydroureter. No focal renal mass.  Delayed renal images shows bilateral renal symmetrical excretion.  There is a tiny umbilical region hernia containing fat without evidence of acute complication. There is a lower abdominal wall right paramedian ventral hernia containing fat without evidence of acute complication measures 4.4 by 2.7 cm.  Minimal  distended gallbladder without evidence of calcified gallstones. There is no evidence of small bowel obstruction. Minimal distended small bowel loops and lower abdomen without evidence of transition point in caliber. No mesenteric fluid collection. No pericecal inflammation.  Normal appendix visualized in coronal image 46.  The uterus and adnexa are unremarkable. The urinary bladder is unremarkable.  IMPRESSION: 1. 1. No evidence of small bowel obstruction. Minimal distended distal small bowel loops without transition point in caliber. Mild enteritis cannot be excluded. 2. Small ventral hernia containing fat without evidence of acute complication. 3. No hydronephrosis or hydroureter. 4. Normal appendix.  No pericecal inflammation. 5. Atherosclerotic vascular calcifications.   Electronically Signed   By: LLahoma CrockerM.D.   On: 09/16/2013 20:15   Dg Abd Acute W/chest  09/16/2013   CLINICAL DATA:  Abdominal pain  EXAM: ACUTE ABDOMEN SERIES (ABDOMEN 2 VIEW & CHEST 1 VIEW)  COMPARISON:  04/02/2013  FINDINGS: Cardiomediastinal silhouette is stable. No acute infiltrate or pleural effusion. No pulmonary edema. No free abdominal air is noted. Mild distended small bowel loops mid abdomen with some air-fluid level suspicious for ileus or early bowel obstruction.  IMPRESSION: No acute disease within chest. Mild distended small bowel loops mid abdomen with some air-fluid level suspicious for ileus or early bowel obstruction.   Electronically Signed   By: LLahoma CrockerM.D.   On: 09/16/2013 18:26  Abd pain, vomiting, dehydration, diarrhea   JMariea Clonts MD 09/19/13 [phone]

## 2013-10-09 ENCOUNTER — Inpatient Hospital Stay (HOSPITAL_COMMUNITY)
Admission: EM | Admit: 2013-10-09 | Discharge: 2013-10-13 | DRG: 690 | Disposition: A | Discharge status: Home / Self Care | Payer: Medicare PPO | Attending: Internal Medicine | Admitting: Internal Medicine

## 2013-10-09 ENCOUNTER — Encounter (HOSPITAL_COMMUNITY): Payer: Self-pay | Admitting: Emergency Medicine

## 2013-10-09 DIAGNOSIS — IMO0002 Reserved for concepts with insufficient information to code with codable children: Secondary | ICD-10-CM | POA: Diagnosis present

## 2013-10-09 DIAGNOSIS — M810 Age-related osteoporosis without current pathological fracture: Secondary | ICD-10-CM | POA: Diagnosis present

## 2013-10-09 DIAGNOSIS — E109 Type 1 diabetes mellitus without complications: Secondary | ICD-10-CM

## 2013-10-09 DIAGNOSIS — Z825 Family history of asthma and other chronic lower respiratory diseases: Secondary | ICD-10-CM

## 2013-10-09 DIAGNOSIS — Z794 Long term (current) use of insulin: Secondary | ICD-10-CM

## 2013-10-09 DIAGNOSIS — E1165 Type 2 diabetes mellitus with hyperglycemia: Secondary | ICD-10-CM | POA: Diagnosis present

## 2013-10-09 DIAGNOSIS — R112 Nausea with vomiting, unspecified: Secondary | ICD-10-CM

## 2013-10-09 DIAGNOSIS — R509 Fever, unspecified: Secondary | ICD-10-CM | POA: Diagnosis present

## 2013-10-09 DIAGNOSIS — N12 Tubulo-interstitial nephritis, not specified as acute or chronic: Principal | ICD-10-CM | POA: Diagnosis present

## 2013-10-09 DIAGNOSIS — E871 Hypo-osmolality and hyponatremia: Secondary | ICD-10-CM | POA: Diagnosis present

## 2013-10-09 DIAGNOSIS — K2970 Gastritis, unspecified, without bleeding: Secondary | ICD-10-CM | POA: Diagnosis present

## 2013-10-09 DIAGNOSIS — Z833 Family history of diabetes mellitus: Secondary | ICD-10-CM

## 2013-10-09 DIAGNOSIS — N390 Urinary tract infection, site not specified: Secondary | ICD-10-CM | POA: Diagnosis present

## 2013-10-09 DIAGNOSIS — E669 Obesity, unspecified: Secondary | ICD-10-CM | POA: Diagnosis present

## 2013-10-09 DIAGNOSIS — Z7982 Long term (current) use of aspirin: Secondary | ICD-10-CM

## 2013-10-09 DIAGNOSIS — K3184 Gastroparesis: Secondary | ICD-10-CM | POA: Diagnosis present

## 2013-10-09 DIAGNOSIS — F411 Generalized anxiety disorder: Secondary | ICD-10-CM

## 2013-10-09 DIAGNOSIS — N181 Chronic kidney disease, stage 1: Secondary | ICD-10-CM | POA: Diagnosis present

## 2013-10-09 DIAGNOSIS — K219 Gastro-esophageal reflux disease without esophagitis: Secondary | ICD-10-CM | POA: Diagnosis present

## 2013-10-09 DIAGNOSIS — F329 Major depressive disorder, single episode, unspecified: Secondary | ICD-10-CM | POA: Diagnosis present

## 2013-10-09 DIAGNOSIS — J45909 Unspecified asthma, uncomplicated: Secondary | ICD-10-CM | POA: Diagnosis present

## 2013-10-09 DIAGNOSIS — R310 Gross hematuria: Secondary | ICD-10-CM | POA: Diagnosis present

## 2013-10-09 DIAGNOSIS — I129 Hypertensive chronic kidney disease with stage 1 through stage 4 chronic kidney disease, or unspecified chronic kidney disease: Secondary | ICD-10-CM | POA: Diagnosis present

## 2013-10-09 DIAGNOSIS — E876 Hypokalemia: Secondary | ICD-10-CM | POA: Diagnosis present

## 2013-10-09 DIAGNOSIS — R739 Hyperglycemia, unspecified: Secondary | ICD-10-CM

## 2013-10-09 DIAGNOSIS — F32A Depression, unspecified: Secondary | ICD-10-CM | POA: Diagnosis present

## 2013-10-09 DIAGNOSIS — E785 Hyperlipidemia, unspecified: Secondary | ICD-10-CM | POA: Diagnosis present

## 2013-10-09 DIAGNOSIS — F3289 Other specified depressive episodes: Secondary | ICD-10-CM | POA: Diagnosis present

## 2013-10-09 LAB — CBC WITH DIFFERENTIAL/PLATELET
Basophils Absolute: 0 10*3/uL (ref 0.0–0.1)
Basophils Relative: 1 % (ref 0–1)
Eosinophils Absolute: 0 10*3/uL (ref 0.0–0.7)
MCH: 29.1 pg (ref 26.0–34.0)
MCHC: 34.9 g/dL (ref 30.0–36.0)
Neutro Abs: 5.5 10*3/uL (ref 1.7–7.7)
Neutrophils Relative %: 69 % (ref 43–77)
RDW: 13.8 % (ref 11.5–15.5)

## 2013-10-09 LAB — COMPREHENSIVE METABOLIC PANEL
AST: 39 U/L — ABNORMAL HIGH (ref 0–37)
Albumin: 3.3 g/dL — ABNORMAL LOW (ref 3.5–5.2)
Alkaline Phosphatase: 74 U/L (ref 39–117)
Chloride: 90 mEq/L — ABNORMAL LOW (ref 96–112)
Creatinine, Ser: 1.21 mg/dL — ABNORMAL HIGH (ref 0.50–1.10)
Potassium: 3.9 mEq/L (ref 3.5–5.1)
Sodium: 125 mEq/L — ABNORMAL LOW (ref 135–145)
Total Bilirubin: 0.8 mg/dL (ref 0.3–1.2)
Total Protein: 8.1 g/dL (ref 6.0–8.3)

## 2013-10-09 LAB — URINALYSIS, ROUTINE W REFLEX MICROSCOPIC
Glucose, UA: >1000 mg/dL — AB
Ketones, ur: 40 mg/dL — AB
Nitrite: POSITIVE — AB
Specific Gravity, Urine: 1.025 (ref 1.005–1.030)
pH: 5.5 (ref 5.0–8.0)

## 2013-10-09 LAB — URINE MICROSCOPIC-ADD ON

## 2013-10-09 NOTE — ED Notes (Signed)
Pt in via EMS, per EMS- Patient in c/o n/v x2 days, fever today, c/o body aches and generalized weakness

## 2013-10-09 NOTE — ED Notes (Signed)
Pt c/o emesis and thrush in mouth. Pt states this is recurrent. Pt also c/o L flank pain with increased urination. Pt also c/o HA.

## 2013-10-10 ENCOUNTER — Emergency Department (HOSPITAL_COMMUNITY): Payer: Medicare PPO

## 2013-10-10 DIAGNOSIS — E109 Type 1 diabetes mellitus without complications: Secondary | ICD-10-CM

## 2013-10-10 DIAGNOSIS — N12 Tubulo-interstitial nephritis, not specified as acute or chronic: Secondary | ICD-10-CM | POA: Diagnosis present

## 2013-10-10 DIAGNOSIS — R7309 Other abnormal glucose: Secondary | ICD-10-CM

## 2013-10-10 DIAGNOSIS — N390 Urinary tract infection, site not specified: Secondary | ICD-10-CM | POA: Diagnosis present

## 2013-10-10 LAB — BLOOD GAS, VENOUS
Acid-base deficit: 3.5 mmol/L — ABNORMAL HIGH (ref 0.0–2.0)
Bicarbonate: 21.2 mEq/L (ref 20.0–24.0)
FIO2: 0.21 %
O2 Saturation: 67.5 %
Patient temperature: 98.6
TCO2: 19.4 mmol/L (ref 0–100)
pCO2, Ven: 39.4 mmHg — ABNORMAL LOW (ref 45.0–50.0)
pH, Ven: 7.351 — ABNORMAL HIGH (ref 7.250–7.300)
pO2, Ven: 34.5 mmHg (ref 30.0–45.0)

## 2013-10-10 LAB — GLUCOSE, CAPILLARY
Glucose-Capillary: 413 mg/dL — ABNORMAL HIGH (ref 70–99)
Glucose-Capillary: 346 mg/dL — ABNORMAL HIGH (ref 70–99)
Glucose-Capillary: 348 mg/dL — ABNORMAL HIGH (ref 70–99)

## 2013-10-10 MED ORDER — ONDANSETRON HCL 4 MG PO TABS: 4.0000 mg | ORAL_TABLET | Freq: Four times a day (QID) | ORAL | Status: DC | PRN

## 2013-10-10 MED ORDER — INSULIN ASPART 100 UNIT/ML ~~LOC~~ SOLN
0.0000 [IU] | Freq: Three times a day (TID) | SUBCUTANEOUS | Status: DC
  Administered 2013-10-10: 13:00:00 11 [IU] via SUBCUTANEOUS
  Administered 2013-10-10: 18:00:00 15 [IU] via SUBCUTANEOUS
  Administered 2013-10-11: 5 [IU] via SUBCUTANEOUS
  Administered 2013-10-11: 18:00:00 3 [IU] via SUBCUTANEOUS
  Administered 2013-10-11 – 2013-10-12 (×2): 2 [IU] via SUBCUTANEOUS

## 2013-10-10 MED ORDER — ONDANSETRON HCL 4 MG/2ML IJ SOLN
4.0000 mg | Freq: Four times a day (QID) | INTRAMUSCULAR | Status: DC | PRN
  Administered 2013-10-10 – 2013-10-12 (×2): 4 mg via INTRAVENOUS
  Filled 2013-10-10 (×3): qty 2

## 2013-10-10 MED ORDER — HYDROMORPHONE HCL PF 1 MG/ML IJ SOLN
1.0000 mg | Freq: Once | INTRAMUSCULAR | Status: AC
  Administered 2013-10-10: 1 mg via INTRAVENOUS
  Filled 2013-10-10: qty 1

## 2013-10-10 MED ORDER — ENOXAPARIN SODIUM 40 MG/0.4ML ~~LOC~~ SOLN
40.0000 mg | SUBCUTANEOUS | Status: DC
  Administered 2013-10-10 – 2013-10-12 (×3): 40 mg via SUBCUTANEOUS
  Filled 2013-10-10 (×4): qty 0.4

## 2013-10-10 MED ORDER — ALBUTEROL SULFATE HFA 108 (90 BASE) MCG/ACT IN AERS
2.0000 | INHALATION_SPRAY | RESPIRATORY_TRACT | Status: DC | PRN
  Filled 2013-10-10: qty 6.7

## 2013-10-10 MED ORDER — INSULIN ASPART 100 UNIT/ML ~~LOC~~ SOLN
8.0000 [IU] | Freq: Once | SUBCUTANEOUS | Status: AC
  Administered 2013-10-10: 03:00:00 via SUBCUTANEOUS

## 2013-10-10 MED ORDER — MORPHINE SULFATE 2 MG/ML IJ SOLN
1.0000 mg | INTRAMUSCULAR | Status: DC | PRN
  Administered 2013-10-11 – 2013-10-12 (×2): 1 mg via INTRAVENOUS
  Filled 2013-10-10 (×2): qty 1

## 2013-10-10 MED ORDER — DEXTROSE 5 % IV SOLN
1.0000 g | Freq: Once | INTRAVENOUS | Status: AC
  Administered 2013-10-10: 1 g via INTRAVENOUS
  Filled 2013-10-10: qty 10

## 2013-10-10 MED ORDER — PANTOPRAZOLE SODIUM 40 MG IV SOLR
40.0000 mg | INTRAVENOUS | Status: DC
  Administered 2013-10-10 – 2013-10-13 (×4): 40 mg via INTRAVENOUS
  Filled 2013-10-10 (×5): qty 40

## 2013-10-10 MED ORDER — DEXTROSE 5 % IV SOLN
1.0000 g | INTRAVENOUS | Status: DC
  Administered 2013-10-11 – 2013-10-13 (×3): 1 g via INTRAVENOUS
  Filled 2013-10-10 (×3): qty 10

## 2013-10-10 MED ORDER — SODIUM CHLORIDE 0.9 % IV SOLN
INTRAVENOUS | Status: DC
  Administered 2013-10-10 – 2013-10-12 (×5): via INTRAVENOUS
  Administered 2013-10-12: 100 mL/h via INTRAVENOUS

## 2013-10-10 MED ORDER — ALBUTEROL SULFATE (2.5 MG/3ML) 0.083% IN NEBU
2.5000 mg | INHALATION_SOLUTION | RESPIRATORY_TRACT | Status: DC | PRN
  Filled 2013-10-10: qty 3

## 2013-10-10 MED ORDER — INSULIN ASPART 100 UNIT/ML ~~LOC~~ SOLN
0.0000 [IU] | Freq: Every day | SUBCUTANEOUS | Status: DC
  Administered 2013-10-10: 2 [IU] via SUBCUTANEOUS

## 2013-10-10 MED ORDER — SODIUM CHLORIDE 0.9 % IV BOLUS (SEPSIS)
1000.0000 mL | Freq: Once | INTRAVENOUS | Status: AC
  Administered 2013-10-10: 1000 mL via INTRAVENOUS

## 2013-10-10 MED ORDER — METOCLOPRAMIDE HCL 5 MG/ML IJ SOLN
5.0000 mg | Freq: Four times a day (QID) | INTRAMUSCULAR | Status: DC
  Administered 2013-10-10 – 2013-10-13 (×12): 5 mg via INTRAVENOUS
  Filled 2013-10-10 (×6): qty 1
  Filled 2013-10-10: qty 2
  Filled 2013-10-10 (×5): qty 1
  Filled 2013-10-10 (×3): qty 2
  Filled 2013-10-10: qty 1

## 2013-10-10 MED ORDER — INSULIN GLARGINE 100 UNIT/ML ~~LOC~~ SOLN
50.0000 [IU] | Freq: Every day | SUBCUTANEOUS | Status: DC
  Administered 2013-10-10 – 2013-10-12 (×3): 50 [IU] via SUBCUTANEOUS
  Filled 2013-10-10 (×4): qty 0.5

## 2013-10-10 MED ORDER — ONDANSETRON HCL 4 MG/2ML IJ SOLN
4.0000 mg | Freq: Once | INTRAMUSCULAR | Status: AC
  Administered 2013-10-10: 4 mg via INTRAVENOUS
  Filled 2013-10-10: qty 2

## 2013-10-10 MED ORDER — FLUCONAZOLE 200 MG PO TABS
200.0000 mg | ORAL_TABLET | Freq: Once | ORAL | Status: AC
  Administered 2013-10-10: 17:00:00 200 mg via ORAL
  Filled 2013-10-10: qty 1

## 2013-10-10 MED ORDER — SODIUM CHLORIDE 0.9 % IJ SOLN: 3.0000 mL | Freq: Two times a day (BID) | INTRAMUSCULAR | Status: DC

## 2013-10-10 MED ORDER — NYSTATIN 100000 UNIT/ML MT SUSP
5.0000 mL | Freq: Four times a day (QID) | OROMUCOSAL | Status: DC
  Administered 2013-10-10 – 2013-10-13 (×10): 500000 [IU] via ORAL
  Filled 2013-10-10 (×14): qty 5

## 2013-10-10 MED ORDER — ACETAMINOPHEN 325 MG PO TABS
650.0000 mg | ORAL_TABLET | Freq: Four times a day (QID) | ORAL | Status: DC | PRN
  Administered 2013-10-10 – 2013-10-13 (×8): 650 mg via ORAL
  Filled 2013-10-10 (×8): qty 2

## 2013-10-10 MED ORDER — METOCLOPRAMIDE HCL 5 MG/ML IJ SOLN
10.0000 mg | Freq: Once | INTRAMUSCULAR | Status: AC
  Administered 2013-10-10: 10 mg via INTRAVENOUS
  Filled 2013-10-10: qty 2

## 2013-10-10 MED ORDER — MAGIC MOUTHWASH W/LIDOCAINE
5.0000 mL | Freq: Four times a day (QID) | ORAL | Status: DC | PRN
  Filled 2013-10-10: qty 5

## 2013-10-10 NOTE — ED Notes (Signed)
Pt has ha and back pain 10/10,  Medication given as per order

## 2013-10-10 NOTE — ED Provider Notes (Signed)
CSN: [id number]     Arrival date & time 10/09/13  1743 History   First MD Initiated Contact with Patient 10/10/13 0102     Chief Complaint  Patient presents with  . Emesis   (Consider location/radiation/quality/duration/timing/severity/associated sxs/prior Treatment) HPI Comments: Patient is 57 year old IDDM with history of gastroparesis. GERD, gastritis who presents to the ED with worsening nausea, vomiting, and abdominal pain.  She states that after she got here she also began to have left flank pain which she was not having at home.  She reports that her abdominal pain is like her usual gastroparesis.  She reports recently she has run out of her "miracle mouthwash" which she takes for recurrent thrush.  She states that when this gets worse, she is unable to eat and drink as well.  She reports no fever, chills, but states that she has noticed blood in her urine when she wipes over the past several days.  She states that initially she had a headache as well but this has eased.  She states that her blood sugars have been running "high" at about 300-400.  She has been seen here in early December for similar episode but she has not followed up with her PCP on this.  The history is provided by the patient. No language interpreter was used.    Past Medical History  Diagnosis Date  . GERD (gastroesophageal reflux disease)   . Gastritis   . Stricture and stenosis of esophagus   . Obesity   . Diabetes mellitus   . Depression   . Asthma   . Hyperlipemia   . Hernia   . Hypertension   . Allergy   . Anxiety   . Osteoporosis   . Tuberculosis     tested positive 2011, no symptoms, was on medicine for 6 months  . TIA (transient ischemic attack)   . Plantar fasciitis   . Arthritis     psoriatic arithritis, DDD   Past Surgical History  Procedure Laterality Date  . Knee surgery      Bilateral  replacement  . Cesarean section      x 2   . Breast surgery    . Joint replacement      bilat  knee    Family History  Problem Relation Age of Onset  . Colon cancer Neg Hx   . Diabetes      maternal aunts and uncles  . Diabetes Father   . Diabetes Sister   . Diabetes Brother   . Liver disease Father   . Sarcoidosis Brother   . Emphysema Mother     smoked  . Emphysema Father     smoked  . Asthma Mother   . Asthma Brother    History  Substance Use Topics  . Smoking status: Never Smoker   . Smokeless tobacco: Never Used  . Alcohol Use: No   OB History   Grav Para Term Preterm Abortions TAB SAB Ect Mult Living                 Review of Systems  Constitutional: Positive for fatigue. Negative for fever and chills.  HENT: Positive for mouth sores and trouble swallowing.   Eyes: Negative for pain.  Respiratory: Negative for chest tightness and shortness of breath.   Cardiovascular: Negative for chest pain.  Gastrointestinal: Positive for nausea, vomiting and abdominal pain. Negative for diarrhea, constipation and blood in stool.  Genitourinary: Positive for dysuria and hematuria. Negative for  vaginal bleeding and vaginal discharge.  Musculoskeletal: Negative for back pain.  Skin: Negative for rash.  Neurological: Positive for headaches. Negative for dizziness.  All other systems reviewed and are negative.    Allergies  Review of patient's allergies indicates no known allergies.  Home Medications   Current Outpatient Rx  Name  Route  Sig  Dispense  Refill  . adalimumab (HUMIRA) 40 MG/0.8ML injection   Subcutaneous   Inject 40 mg into the skin once a week. Sundays.         Marland Kitchen albuterol (PROAIR HFA) 108 (90 BASE) MCG/ACT inhaler   Inhalation   Inhale 2 puffs into the lungs every 4 (four) hours as needed for wheezing or shortness of breath.         Marland Kitchen albuterol (PROVENTIL) (2.5 MG/3ML) 0.083% nebulizer solution   Nebulization   Take 2.5 mg by nebulization every 4 (four) hours as needed for wheezing or shortness of breath.          Marland Kitchen aspirin EC 81 MG  tablet   Oral   Take 81 mg by mouth every morning.         . calcium-vitamin D (OSCAL WITH D) 500-200 MG-UNIT per tablet   Oral   Take 1 tablet by mouth every morning.          . Canagliflozin (INVOKANA) 300 MG TABS   Oral   Take 300 mg by mouth at bedtime.          Marland Kitchen esomeprazole (NEXIUM) 40 MG capsule   Oral   Take 40 mg by mouth at bedtime.         . folic acid (FOLVITE) 1 MG tablet   Oral   Take 1 mg by mouth every morning.          Marland Kitchen glimepiride (AMARYL) 4 MG tablet   Oral   Take 4 mg by mouth daily before breakfast.          . insulin glargine (LANTUS) 100 UNIT/ML injection   Subcutaneous   Inject 50 Units into the skin at bedtime.          Marland Kitchen losartan (COZAAR) 50 MG tablet   Oral   Take 50 mg by mouth every morning.         . metFORMIN (GLUCOPHAGE) 1000 MG tablet   Oral   Take 1,000 mg by mouth 2 (two) times daily with a meal.          . methotrexate (RHEUMATREX) 2.5 MG tablet   Oral   Take 25 mg by mouth once a week. Caution:Chemotherapy. Protect from light.  Taken on Wednesdays.         Marland Kitchen nystatin (MYCOSTATIN) 100000 UNIT/ML suspension   Oral   Take 5 mLs by mouth See admin instructions. Place 1 ml to inside of each cheek and on tongue four times daily.         . traMADol (ULTRAM) 50 MG tablet   Oral   Take 50-100 mg by mouth every 6 (six) hours as needed for pain.         Marland Kitchen zoledronic acid (RECLAST) 5 MG/100ML SOLN   Intravenous   Inject 5 mg into the vein. Received yearly dose 06/11/13          BP 139/76  Pulse 113  Resp 18  Ht 5' (1.524 m)  Wt 165 lb (74.844 kg)  BMI 32.22 kg/m2  SpO2 97% Physical Exam  Nursing note and vitals reviewed. Constitutional:  She is oriented to person, place, and time. She appears well-developed and well-nourished. No distress.  HENT:  Head: Normocephalic and atraumatic.  Right Ear: External ear normal.  Left Ear: External ear normal.  Nose: Nose normal.  Thick white plaque noted to  posterior pharynx  Eyes: Conjunctivae are normal. No scleral icterus.  Neck: Normal range of motion. Neck supple.  Cardiovascular: Normal rate, regular rhythm and normal heart sounds.  Exam reveals no gallop and no friction rub.   No murmur heard. Pulmonary/Chest: Effort normal and breath sounds normal. No respiratory distress. She has no wheezes. She has no rales. She exhibits no tenderness.  Abdominal: Soft. Bowel sounds are normal. She exhibits no distension and no mass. There is tenderness in the epigastric area. There is CVA tenderness. There is no rebound and no guarding.    Left CVA tenderness  Musculoskeletal: Normal range of motion. She exhibits no edema and no tenderness.  Lymphadenopathy:    She has no cervical adenopathy.  Neurological: She is alert and oriented to person, place, and time. She exhibits normal muscle tone. Coordination normal.  Skin: Skin is warm and dry. No rash noted. No erythema. No pallor.  Psychiatric: She has a normal mood and affect. Her behavior is normal. Judgment and thought content normal.    ED Course  Procedures (including critical care time) Labs Review Labs Reviewed  COMPREHENSIVE METABOLIC PANEL - Abnormal; Notable for the following:    Sodium 125 (*)    Chloride 90 (*)    Glucose, Bld 377 (*)    Creatinine, Ser 1.21 (*)    Albumin 3.3 (*)    AST 39 (*)    GFR calc non Af Amer 49 (*)    GFR calc Af Amer 57 (*)    All other components within normal limits  URINALYSIS, ROUTINE W REFLEX MICROSCOPIC - Abnormal; Notable for the following:    Color, Urine RED (*)    APPearance TURBID (*)    Glucose, UA >1000 (*)    Hgb urine dipstick LARGE (*)    Bilirubin Urine MODERATE (*)    Ketones, ur 40 (*)    Protein, ur 100 (*)    Nitrite POSITIVE (*)    Leukocytes, UA LARGE (*)    All other components within normal limits  URINE MICROSCOPIC-ADD ON - Abnormal; Notable for the following:    Bacteria, UA MANY (*)    Casts GRANULAR CAST (*)     All other components within normal limits  GLUCOSE, CAPILLARY - Abnormal; Notable for the following:    Glucose-Capillary 413 (*)    All other components within normal limits  URINE CULTURE  CBC WITH DIFFERENTIAL  LIPASE, BLOOD   Imaging Review No results found.  EKG Interpretation   None      Results for orders placed during the hospital encounter of 10/09/13  CBC WITH DIFFERENTIAL      Result Value Range   WBC 7.9  4.0 - 10.5 K/uL   RBC 4.95  3.87 - 5.11 MIL/uL   Hemoglobin 14.4  12.0 - 15.0 g/dL   HCT 41.3  36.0 - 46.0 %   MCV 83.4  78.0 - 100.0 fL   MCH 29.1  26.0 - 34.0 pg   MCHC 34.9  30.0 - 36.0 g/dL   RDW 13.8  11.5 - 15.5 %   Platelets 156  150 - 400 K/uL   Neutrophils Relative % 69  43 - 77 %   Neutro Abs 5.5  1.7 - 7.7 K/uL   Lymphocytes Relative 20  12 - 46 %   Lymphs Abs 1.6  0.7 - 4.0 K/uL   Monocytes Relative 10  3 - 12 %   Monocytes Absolute 0.8  0.1 - 1.0 K/uL   Eosinophils Relative 0  0 - 5 %   Eosinophils Absolute 0.0  0.0 - 0.7 K/uL   Basophils Relative 1  0 - 1 %   Basophils Absolute 0.0  0.0 - 0.1 K/uL  COMPREHENSIVE METABOLIC PANEL      Result Value Range   Sodium 125 (*) 135 - 145 mEq/L   Potassium 3.9  3.5 - 5.1 mEq/L   Chloride 90 (*) 96 - 112 mEq/L   CO2 20  19 - 32 mEq/L   Glucose, Bld 377 (*) 70 - 99 mg/dL   BUN 16  6 - 23 mg/dL   Creatinine, Ser 1.21 (*) 0.50 - 1.10 mg/dL   Calcium 9.0  8.4 - 10.5 mg/dL   Total Protein 8.1  6.0 - 8.3 g/dL   Albumin 3.3 (*) 3.5 - 5.2 g/dL   AST 39 (*) 0 - 37 U/L   ALT 20  0 - 35 U/L   Alkaline Phosphatase 74  39 - 117 U/L   Total Bilirubin 0.8  0.3 - 1.2 mg/dL   GFR calc non Af Amer 49 (*) >90 mL/min   GFR calc Af Amer 57 (*) >90 mL/min  LIPASE, BLOOD      Result Value Range   Lipase 30  11 - 59 U/L  URINALYSIS, ROUTINE W REFLEX MICROSCOPIC      Result Value Range   Color, Urine RED (*) YELLOW   APPearance TURBID (*) CLEAR   Specific Gravity, Urine 1.025  1.005 - 1.030   pH 5.5  5.0 - 8.0    Glucose, UA >1000 (*) NEGATIVE mg/dL   Hgb urine dipstick LARGE (*) NEGATIVE   Bilirubin Urine MODERATE (*) NEGATIVE   Ketones, ur 40 (*) NEGATIVE mg/dL   Protein, ur 100 (*) NEGATIVE mg/dL   Urobilinogen, UA 1.0  0.0 - 1.0 mg/dL   Nitrite POSITIVE (*) NEGATIVE   Leukocytes, UA LARGE (*) NEGATIVE  URINE MICROSCOPIC-ADD ON      Result Value Range   WBC, UA TOO NUMEROUS TO COUNT  <3 WBC/hpf   RBC / HPF TOO NUMEROUS TO COUNT  <3 RBC/hpf   Bacteria, UA MANY (*) RARE   Casts GRANULAR CAST (*) NEGATIVE  GLUCOSE, CAPILLARY      Result Value Range   Glucose-Capillary 413 (*) 70 - 99 mg/dL  GLUCOSE, CAPILLARY      Result Value Range   Glucose-Capillary 346 (*) 70 - 99 mg/dL   Ct Abdomen Pelvis W Contrast  09/16/2013   CLINICAL DATA:  Abdominal pain, possible bowel obstruction  EXAM: CT ABDOMEN AND PELVIS WITH CONTRAST  TECHNIQUE: Multidetector CT imaging of the abdomen and pelvis was performed using the standard protocol following bolus administration of intravenous contrast.  CONTRAST:  162m OMNIPAQUE IOHEXOL 300 MG/ML  SOLN  COMPARISON:  01/13/2013  FINDINGS: Lung bases are unremarkable. Sagittal images of the spine are unremarkable. Fatty infiltration of the liver. No calcified gallstones are noted within gallbladder. Atherosclerotic calcifications of abdominal aorta, SMA origin and bilateral iliac arteries are noted. No aortic aneurysm.  Pancreas, spleen and adrenal glands are unremarkable. Kidneys are symmetrical in size and enhancement. No hydronephrosis or hydroureter. No focal renal mass.  Delayed renal images shows bilateral renal symmetrical  excretion.  There is a tiny umbilical region hernia containing fat without evidence of acute complication. There is a lower abdominal wall right paramedian ventral hernia containing fat without evidence of acute complication measures 4.4 by 2.7 cm.  Minimal distended gallbladder without evidence of calcified gallstones. There is no evidence of small bowel  obstruction. Minimal distended small bowel loops and lower abdomen without evidence of transition point in caliber. No mesenteric fluid collection. No pericecal inflammation.  Normal appendix visualized in coronal image 46.  The uterus and adnexa are unremarkable. The urinary bladder is unremarkable.  IMPRESSION: 1. 1. No evidence of small bowel obstruction. Minimal distended distal small bowel loops without transition point in caliber. Mild enteritis cannot be excluded. 2. Small ventral hernia containing fat without evidence of acute complication. 3. No hydronephrosis or hydroureter. 4. Normal appendix.  No pericecal inflammation. 5. Atherosclerotic vascular calcifications.   Electronically Signed   By: Lahoma Crocker M.D.   On: 09/16/2013 20:15   Dg Abd Acute W/chest  09/16/2013   CLINICAL DATA:  Abdominal pain  EXAM: ACUTE ABDOMEN SERIES (ABDOMEN 2 VIEW & CHEST 1 VIEW)  COMPARISON:  04/02/2013  FINDINGS: Cardiomediastinal silhouette is stable. No acute infiltrate or pleural effusion. No pulmonary edema. No free abdominal air is noted. Mild distended small bowel loops mid abdomen with some air-fluid level suspicious for ileus or early bowel obstruction.  IMPRESSION: No acute disease within chest. Mild distended small bowel loops mid abdomen with some air-fluid level suspicious for ileus or early bowel obstruction.   Electronically Signed   By: Lahoma Crocker M.D.   On: 09/16/2013 18:26    7:27 AM Patient with hypoxia and tachycardia but denies chest pain, cough or congestion - noted with fever of 103 now, tachycardia likely from the fever, previous oxygen sats 97-100% on room air, awaiting chest x-ray.   MDM  Hyperglycemia Gastroparesis Pyelonephritis   Patient is 57 year old female with history of IDDM and gastroparesis who presents with her usual symptoms of vomiting and abdominal pain with this.  She reports fever and left flank pain with gross hematuria noted today.  She denies history of kidney stone -  recent CT have no evidence of nephrolithiasis so I doubt hematuria caused by stone.  Patient with fever and continued pain so we will admit for IV antibiotic in the presence of her gastroparesis.     Idalia Needle Joelyn Oms, [place] 10/10/13 [phone]

## 2013-10-10 NOTE — ED Notes (Signed)
Pt placed on oxygen 2L [place]  For decreased sats of 85 after oxygen placed and few deep breaths,  O2 96

## 2013-10-10 NOTE — ED Notes (Signed)
Pt taken to bathroom by Elisha Headland NT

## 2013-10-10 NOTE — ED Provider Notes (Signed)
Medical screening examination/treatment/procedure(s) were performed by non-physician practitioner and as supervising physician I was immediately available for consultation/collaboration.   Teressa Lower, MD 10/10/13 [phone]

## 2013-10-10 NOTE — Progress Notes (Signed)
Clinical Social Work Department BRIEF PSYCHOSOCIAL ASSESSMENT 10/10/2013  Patient:  Carrie [name], Carrie [name]     Account Number:  [id number]     Admit date:  10/09/2013  Clinical Social Worker:  Rochele Pages  Date/Time:  10/10/2013 08:45 AM  Referred by:  CSW  Date Referred:  10/10/2013 Referred for  Psychosocial assessment   Other Referral:   Interview type:  Patient Other interview type:   Carrie [name], was also at bedside    PSYCHOSOCIAL DATA Living Status:  [place] Admitted from facility:   Level of care:   Primary support name:  Carrie [name] Primary support relationship to patient:  FRIEND Degree of support available:   Strong. Carrie [name], who was with pt at bedside, is pt's neighbor and close friend. She has been with pt since admission at 6:30pm last night.    Pt's son is 78 and lives with her. He is currently staying with a friend.    Pt has an adult daugther, Carrie [name], who also lives in the neighborhood.    CURRENT CONCERNS Current Concerns  Adjustment to Illness  Other - See comment   Other Concerns:   Pt stated her primary concern was finding out what is medically wrong.    Pt is looking for a new PCP after hers at [facility] retired. She is trying to get a PCP at [facility], where she already sees a Copy.    [place] / [facility] CSW met with pt and pt's close friend, Carrie [name], at bedside.    Pt lives in a mobile home with 4-5 steps to entrance with her 67 y/o son. Son is staying with a friend currently. Pt's 82 y/o daugther, Carrie [name], lives in the same neighborhood.    Pt states her primary concern is finding out what is wrong medically.  Pt is also looking for a new PCP, after hers retired. States she does not like the other MDs at [facility], so she is looking for a new MD at [facility].    Pt and friend asked questions about what MD would follow her at hospital. CSW explained  hospitalists.   Assessment/plan status:  No Further Intervention Required Other assessment/ plan:   no CSW needs at this time.   Information/referral to community resources:   none given at this time    PATIENT'S/FAMILY'S RESPONSE TO PLAN OF CARE: Pt and friend thanked CSW for her time and information given.      [place], [phone]     ED CSW  9:00am

## 2013-10-10 NOTE — ED Notes (Signed)
Pt given ice water per pt and Karleen Hampshire, MD request.

## 2013-10-10 NOTE — H&P (Signed)
[facility] History and Physical  Carrie [name]:[id number] DOB: 1956-05-29 DOA: 10/09/2013  Referring physician: EDP PCP: Laurel Dimmer, MD   Chief Complaint: nausea vomitingover the last 3 days.   HPI: Carrie [name] is a 57 y.o. female with prior h/o hypertension, DM, came in for nausea , vomiting and flank pain and lower quadrant abd pain. On arrival to ED, she was found to be febrile , hyperglycemic and was found to have pyelonephritis. She was referred to medical service for admission. She was started on rocephina nd iv FLUIDS. She denies any other complaints.    Review of Systems:  Constitutional:  No weight loss, night sweats, , fatigue.  HEENT:   Difficulty swallowing,Tooth/dental problems,Sore throat,  No sneezing, itching, ear ache, nasal congestion, post nasal drip,  Cardio-vascular:  No chest pain, Orthopnea, PND, swelling in lower extremities, anasarca, dizziness, palpitations  GI:  No heartburn, indigestion, abdominal pain, nausea, vomiting, diarrhea, change in bowel habits, loss of appetite  Resp:  No shortness of breath with exertion or at rest. No excess mucus, no productive cough, No non-productive cough, No coughing up of blood.No change in color of mucus.No wheezing.No chest wall deformity  Skin:  no rash or lesions.  GU:  Positive for dysuria, flank pain.  Musculoskeletal:  No joint pain or swelling. No decreased range of motion.  Psych:  No change in mood or affect. No depression or anxiety. No memory loss.   Past Medical History  Diagnosis Date  . GERD (gastroesophageal reflux disease)   . Gastritis   . Stricture and stenosis of esophagus   . Obesity   . Diabetes mellitus   . Depression   . Asthma   . Hyperlipemia   . Hernia   . Hypertension   . Allergy   . Anxiety   . Osteoporosis   . Tuberculosis     tested positive 2011, no symptoms, was on medicine for 6 months  . TIA (transient ischemic attack)   . Plantar fasciitis   .  Arthritis     psoriatic arithritis, DDD   Past Surgical History  Procedure Laterality Date  . Knee surgery      Bilateral  replacement  . Cesarean section      x 2   . Breast surgery    . Joint replacement      bilat knee    Social History:  reports that she has never smoked. She has never used smokeless tobacco. She reports that she does not drink alcohol or use illicit drugs.  No Known Allergies  Family History  Problem Relation Age of Onset  . Colon cancer Neg Hx   . Diabetes      maternal aunts and uncles  . Diabetes Father   . Diabetes Sister   . Diabetes Brother   . Liver disease Father   . Sarcoidosis Brother   . Emphysema Mother     smoked  . Emphysema Father     smoked  . Asthma Mother   . Asthma Brother      Prior to Admission medications   Medication Sig Start Date End Date Taking? Authorizing Provider  adalimumab (HUMIRA) 40 MG/0.8ML injection Inject 40 mg into the skin once a week. Sundays.   Yes Historical Provider, MD  albuterol (PROAIR HFA) 108 (90 BASE) MCG/ACT inhaler Inhale 2 puffs into the lungs every 4 (four) hours as needed for wheezing or shortness of breath.   Yes Historical Provider, MD  albuterol (  PROVENTIL) (2.5 MG/3ML) 0.083% nebulizer solution Take 2.5 mg by nebulization every 4 (four) hours as needed for wheezing or shortness of breath.  10/20/12  Yes Historical Provider, MD  aspirin EC 81 MG tablet Take 81 mg by mouth every morning.   Yes Historical Provider, MD  calcium-vitamin D (OSCAL WITH D) 500-200 MG-UNIT per tablet Take 1 tablet by mouth every morning.    Yes Historical Provider, MD  Canagliflozin (INVOKANA) 300 MG TABS Take 300 mg by mouth at bedtime.    Yes Historical Provider, MD  esomeprazole (NEXIUM) 40 MG capsule Take 40 mg by mouth at bedtime.   Yes Historical Provider, MD  folic acid (FOLVITE) 1 MG tablet Take 1 mg by mouth every morning.    Yes Historical Provider, MD  glimepiride (AMARYL) 4 MG tablet Take 4 mg by mouth daily  before breakfast.  04/02/11  Yes Historical Provider, MD  insulin glargine (LANTUS) 100 UNIT/ML injection Inject 50 Units into the skin at bedtime.    Yes Historical Provider, MD  losartan (COZAAR) 50 MG tablet Take 50 mg by mouth every morning.   Yes Historical Provider, MD  metFORMIN (GLUCOPHAGE) 1000 MG tablet Take 1,000 mg by mouth 2 (two) times daily with a meal.  03/29/11  Yes Historical Provider, MD  methotrexate (RHEUMATREX) 2.5 MG tablet Take 25 mg by mouth once a week. Caution:Chemotherapy. Protect from light.  Taken on Wednesdays.   Yes Historical Provider, MD  nystatin (MYCOSTATIN) 100000 UNIT/ML suspension Take 5 mLs by mouth See admin instructions. Place 1 ml to inside of each cheek and on tongue four times daily.   Yes Historical Provider, MD  traMADol (ULTRAM) 50 MG tablet Take 50-100 mg by mouth every 6 (six) hours as needed for pain.   Yes Historical Provider, MD  zoledronic acid (RECLAST) 5 MG/100ML SOLN Inject 5 mg into the vein. Received yearly dose 06/11/13   Yes Historical Provider, MD   Physical Exam: Filed Vitals:   10/10/13 1413  BP:   Pulse:   Temp: 100 F (37.8 C)  Resp:     BP 125/69  Pulse 113  Temp(Src) 100 F (37.8 C) (Oral)  Resp 22  Ht 5' (1.524 m)  Wt 77.429 kg (170 lb 11.2 oz)  BMI 33.34 kg/m2  SpO2 98%  General:  Appears calm and comfortable Eyes: PERRL, normal lids, irises & conjunctiva ENT: grossly normal hearing, lips & tongue Neck: no LAD, masses or thyromegaly Cardiovascular: RRR, no m/r/g. No LE edema. Respiratory: CTA bilaterally, no w/r/r. Normal respiratory effort. Abdomen: soft, mildly tender in the lower quadrant.  Skin: no rash or induration seen on limited exam Musculoskeletal: grossly normal tone BUE/BLE Psychiatric: grossly normal mood and affect, speech fluent and appropriate Neurologic: grossly non-focal.          Labs on Admission:  Basic Metabolic Panel:  Recent Labs Lab 10/09/13 1913  NA 125*  K 3.9  CL 90*  CO2  20  GLUCOSE 377*  BUN 16  CREATININE 1.21*  CALCIUM 9.0   Liver Function Tests:  Recent Labs Lab 10/09/13 1913  AST 39*  ALT 20  ALKPHOS 74  BILITOT 0.8  PROT 8.1  ALBUMIN 3.3*    Recent Labs Lab 10/09/13 1913  LIPASE 30   No results found for this basename: AMMONIA,  in the last 168 hours CBC:  Recent Labs Lab 10/09/13 1913  WBC 7.9  NEUTROABS 5.5  HGB 14.4  HCT 41.3  MCV 83.4  PLT 156  Cardiac Enzymes: No results found for this basename: CKTOTAL, CKMB, CKMBINDEX, TROPONINI,  in the last 168 hours  BNP (last 3 results) No results found for this basename: PROBNP,  in the last 8760 hours CBG:  Recent Labs Lab 10/10/13 0310 10/10/13 0539 10/10/13 1136  GLUCAP 413* 346* 348*    Radiological Exams on Admission: Dg Chest 2 View  10/10/2013   CLINICAL DATA:  Shortness of Breath  EXAM: CHEST  2 VIEW  COMPARISON:  September 16, 2013  FINDINGS: Lungs are clear. Heart size and pulmonary vascularity are normal. No adenopathy. No bone lesions.  IMPRESSION: No edema or consolidation.   Electronically Signed   By: Lowella Grip M.D.   On: 10/10/2013 07:36    EKG: pending.   Assessment/Plan Active Problems:   DIABETES MELLITUS, TYPE I   DEPRESSION   ASTHMA   Gastroparesis   Pyelonephritis   UTI (lower urinary tract infection)  Pyelonephritis: - admitted to med-surg - started on IV fluids, anti emetics and pain control - continue with rocephin - urine cultures and blood cultures ordered and pending.   Uncontrolled diabetes mellitus:  - SSI.  - resume lantus.  - hgba1c  Asthma: Currently not wheezing, resume BD as needed.   Nausea vomiting:  Possibly from the pyelonephritis vs gastroparesis vs gastritis vs gastroenteritis - symptomatic management and with IV reglan and protonix.  - started on clear liquid diet. - advance as tolerated.     Hyponatremia: -  Dehydration vs hyperglycemia - hydrate and repeat in am.   DVT prophylaxis.     Code Status: full code Family Communication: friend at bedside, updated and discussed the plan of care with patient and friend.  Disposition Plan: admitted to inpatient.  Time spent: 75 min  [facility] Pager [phone]

## 2013-10-11 DIAGNOSIS — N12 Tubulo-interstitial nephritis, not specified as acute or chronic: Principal | ICD-10-CM

## 2013-10-11 DIAGNOSIS — F411 Generalized anxiety disorder: Secondary | ICD-10-CM

## 2013-10-11 LAB — URINE CULTURE: Colony Count: >=100000

## 2013-10-11 LAB — BASIC METABOLIC PANEL
BUN: 16 mg/dL (ref 6–23)
Calcium: 7.7 mg/dL — ABNORMAL LOW (ref 8.4–10.5)
Chloride: 98 mEq/L (ref 96–112)
Creatinine, Ser: 1.45 mg/dL — ABNORMAL HIGH (ref 0.50–1.10)
GFR calc Af Amer: 46 mL/min — ABNORMAL LOW (ref >90)
Glucose, Bld: 234 mg/dL — ABNORMAL HIGH (ref 70–99)
Potassium: 3.5 mEq/L (ref 3.5–5.1)

## 2013-10-11 LAB — CBC
HCT: 36.9 % (ref 36.0–46.0)
Hemoglobin: 12.4 g/dL (ref 12.0–15.0)
MCH: 28.4 pg (ref 26.0–34.0)
MCHC: 33.6 g/dL (ref 30.0–36.0)
MCV: 84.6 fL (ref 78.0–100.0)
RDW: 14.3 % (ref 11.5–15.5)

## 2013-10-11 LAB — GLUCOSE, CAPILLARY: Glucose-Capillary: 138 mg/dL — ABNORMAL HIGH (ref 70–99)

## 2013-10-11 NOTE — Progress Notes (Signed)
[facility] PROGRESS NOTE  Carrie [name] QAS:[id number] DOB: 1956-03-27 DOA: 10/09/2013 PCP: Laurel Dimmer, MD  Assessment/Plan: Pyelonephritis:  - admitted to med-surg  - started on IV fluids, anti emetics and pain control  - continue with rocephin  - urine cultures grew e coli pan sensitive. and blood cultures ordered and pending.  Uncontrolled diabetes mellitus:  - SSI.  - resume lantus.  - hgba1c pending.  Asthma:  Currently not wheezing, resume BD as needed.  Nausea vomiting:  Possibly from the pyelonephritis vs gastroparesis vs gastritis vs gastroenteritis  - symptomatic management and with IV reglan and protonix.  - started on clear liquid diet.  - advance as tolerated.  Hyponatremia:  - Dehydration vs hyperglycemia  - hydrate and repeat in am.  DVT prophylaxis.  Code Status: full code  Family Communication: friend at bedside, updated and discussed the plan of care with patient  Disposition Plan: admitted to inpatient.    Consultants:  none  Procedures:  none  Antibiotics:  Rocephin 12/28  HPI/Subjective: Anxious ,.   Objective: Filed Vitals:   10/11/13 1500  BP: 106/65  Pulse: 96  Temp: 98.6 F (37 C)  Resp: 22    Intake/Output Summary (Last 24 hours) at 10/11/13 1916 Last data filed at 10/11/13 1506  Gross per 24 hour  Intake   1380 ml  Output    601 ml  Net    779 ml   Filed Weights   10/09/13 1901 10/10/13 1230  Weight: 74.844 kg (165 lb) 77.429 kg (170 lb 11.2 oz)    Exam:   General:  Alert aferbrile comfortable  Cardiovascular: s1s2  Respiratory: ctab  Abdomen: sfot NT ND BS+  Musculoskeletal: NO PEDAL EDEMA  Data Reviewed: Basic Metabolic Panel:  Recent Labs Lab 10/09/13 1913 10/11/13 0520  NA 125* 130*  K 3.9 3.5  CL 90* 98  CO2 20 20  GLUCOSE 377* 234*  BUN 16 16  CREATININE 1.21* 1.45*  CALCIUM 9.0 7.7*   Liver Function Tests:  Recent Labs Lab 10/09/13 1913  AST 39*  ALT 20  ALKPHOS  74  BILITOT 0.8  PROT 8.1  ALBUMIN 3.3*    Recent Labs Lab 10/09/13 1913  LIPASE 30   No results found for this basename: AMMONIA,  in the last 168 hours CBC:  Recent Labs Lab 10/09/13 1913 10/11/13 0520  WBC 7.9 9.5  NEUTROABS 5.5  --   HGB 14.4 12.4  HCT 41.3 36.9  MCV 83.4 84.6  PLT 156 108*   Cardiac Enzymes: No results found for this basename: CKTOTAL, CKMB, CKMBINDEX, TROPONINI,  in the last 168 hours BNP (last 3 results) No results found for this basename: PROBNP,  in the last 8760 hours CBG:  Recent Labs Lab 10/10/13 1737 10/10/13 2154 10/11/13 0837 10/11/13 1154 10/11/13 1801  GLUCAP 380* 203* 240* 138* 152*    Recent Results (from the past 240 hour(s))  URINE CULTURE     Status: None   Collection Time    10/09/13  7:32 PM      Result Value Range Status   Specimen Description URINE, CLEAN CATCH   Final   Special Requests NONE   Final   Culture  Setup Time     Final   Value: 10/10/2013 00:19     Performed at [place] [facility]     Final   Value: >=100,000 COLONIES/ML     Performed at [facility]  Final   Value: ESCHERICHIA COLI     Performed at [facility]   Report Status 10/11/2013 FINAL   Final   Organism ID, Bacteria ESCHERICHIA COLI   Final  CULTURE, BLOOD (ROUTINE X 2)     Status: None   Collection Time    10/10/13  4:14 PM      Result Value Range Status   Specimen Description BLOOD RIGHT HAND   Final   Special Requests BOTTLES DRAWN AEROBIC AND ANAEROBIC 5CC   Final   Culture  Setup Time     Final   Value: 10/10/2013 20:15     Performed at [facility]   Culture     Final   Value:        BLOOD CULTURE RECEIVED NO GROWTH TO DATE CULTURE WILL BE HELD FOR 5 DAYS BEFORE ISSUING A FINAL NEGATIVE REPORT     Performed at [facility]   Report Status PENDING   Incomplete  CULTURE, BLOOD (ROUTINE X 2)     Status: None   Collection Time    10/10/13  4:27 PM      Result Value  Range Status   Specimen Description BLOOD LEFT ARM   Final   Special Requests BOTTLES DRAWN AEROBIC ONLY 5CC   Final   Culture  Setup Time     Final   Value: 10/10/2013 20:15     Performed at [facility]   Culture     Final   Value:        BLOOD CULTURE RECEIVED NO GROWTH TO DATE CULTURE WILL BE HELD FOR 5 DAYS BEFORE ISSUING A FINAL NEGATIVE REPORT     Performed at [facility]   Report Status PENDING   Incomplete  CLOSTRIDIUM DIFFICILE BY PCR     Status: None   Collection Time    10/11/13 11:05 AM      Result Value Range Status   C difficile by pcr NEGATIVE  NEGATIVE Final   Comment: Performed at [facility]     Studies: Dg Chest 2 View  10/10/2013   CLINICAL DATA:  Shortness of Breath  EXAM: CHEST  2 VIEW  COMPARISON:  September 16, 2013  FINDINGS: Lungs are clear. Heart size and pulmonary vascularity are normal. No adenopathy. No bone lesions.  IMPRESSION: No edema or consolidation.   Electronically Signed   By: Lowella Grip M.D.   On: 10/10/2013 07:36    Scheduled Meds: . cefTRIAXone (ROCEPHIN)  IV  1 g Intravenous Q24H  . enoxaparin (LOVENOX) injection  40 mg Subcutaneous Q24H  . insulin aspart  0-15 Units Subcutaneous TID WC  . insulin aspart  0-5 Units Subcutaneous QHS  . insulin glargine  50 Units Subcutaneous QHS  . metoCLOPramide (REGLAN) injection  5 mg Intravenous Q6H  . nystatin  5 mL Oral QID  . pantoprazole (PROTONIX) IV  40 mg Intravenous Q24H  . sodium chloride  3 mL Intravenous Q12H   Continuous Infusions: . sodium chloride 100 mL/hr at 10/11/13 4235    Active Problems:   DIABETES MELLITUS, TYPE I   DEPRESSION   ASTHMA   Gastroparesis   Pyelonephritis   UTI (lower urinary tract infection)    Time spent: 35 min    Vyron Fronczak  [facility] Pager [phone]. If 7PM-7AM, please contact night-coverage at [email or web address], password [facility] 10/11/2013, 7:16 PM  LOS: 2 days

## 2013-10-11 NOTE — Evaluation (Signed)
Physical Therapy Evaluation Patient Details Name: Carrie [name] MRN: [id number] DOB: 10-26-1955 Today's Date: 10/11/2013 Time: 5625-6389 PT Time Calculation (min): 18 min  PT Assessment / Plan / Recommendation History of Present Illness  57 y.o. female with prior h/o hypertension, DM, came in for nausea , vomiting and flank pain and lower quadrant abd pain. On arrival to ED, she was found to be febrile , hyperglycemic and was found to have pyelonephritis.  Clinical Impression  Pt admitted with pyelonephritis. Pt currently with functional limitations due to the deficits listed below (see PT Problem List). Pt had 1 LOB with ambulation during eval. Pt will benefit from skilled PT to increase their independence and safety with mobility to allow discharge to the venue listed below.      PT Assessment  Patient needs continued PT services    Follow Up Recommendations  No PT follow up    Does the patient have the potential to tolerate intense rehabilitation      Barriers to Discharge        Equipment Recommendations  None recommended by PT    Recommendations for Other Services     Frequency Min 3X/week    Precautions / Restrictions     Pertinent Vitals/Pain 8/10 headache.  Did not want pain meds at that time.  Nurse going to see pt after PT eval completed.      Mobility  Bed Mobility Bed Mobility: Supine to Sit Supine to Sit: 5: Supervision Transfers Transfers: Sit to Stand;Stand to Sit Sit to Stand: 5: Supervision Stand to Sit: 5: Supervision Ambulation/Gait Ambulation/Gait Assistance: 4: Min guard Ambulation Distance (Feet): 120 Feet Assistive device: None Ambulation/Gait Assistance Details: Pt with 1 LOB during gait Gait Pattern: Decreased step length - right;Decreased step length - left Gait velocity: decreased    Exercises     PT Diagnosis: Difficulty walking  PT Problem List: Decreased balance;Decreased mobility PT Treatment Interventions: Gait training;Stair  training;Functional mobility training;Therapeutic activities;Therapeutic exercise     PT Goals(Current goals can be found in the care plan section) Acute Rehab PT Goals Patient Stated Goal: Feel better and go home. PT Goal Formulation: With patient Time For Goal Achievement: 10/25/13 Potential to Achieve Goals: Good  Visit Information  Last PT Received On: 10/11/13 Assistance Needed: +1 History of Present Illness: 57 y.o. female with prior h/o hypertension, DM, came in for nausea , vomiting and flank pain and lower quadrant abd pain. On arrival to ED, she was found to be febrile , hyperglycemic and was found to have pyelonephritis.       Prior [place] expects to be discharged to:: Private residence Living Arrangements: Children Available Help at Discharge: Friend(s);Neighbor;Family Type of Home: Mobile home Home Access: Stairs to enter Entrance Stairs-Rails: Can reach both Home Layout: One level Home Equipment: Walker - 4 wheels;Cane - single point Prior Function Level of Independence: Independent Communication Communication: No difficulties    Cognition  Cognition Arousal/Alertness: Awake/alert Behavior During Therapy: WFL for tasks assessed/performed Overall Cognitive Status: Within Functional Limits for tasks assessed    Extremity/Trunk Assessment Upper Extremity Assessment Upper Extremity Assessment: Overall WFL for tasks assessed Lower Extremity Assessment Lower Extremity Assessment: Overall WFL for tasks assessed Cervical / Trunk Assessment Cervical / Trunk Assessment: Normal   Balance    End of Session PT - End of Session Equipment Utilized During Treatment: Gait belt Activity Tolerance: Patient tolerated treatment well Patient left: in chair;with call bell/phone within reach Nurse Communication: Mobility status  GP     Carrie [name] 10/11/2013, 12:47 PM

## 2013-10-12 DIAGNOSIS — N390 Urinary tract infection, site not specified: Secondary | ICD-10-CM

## 2013-10-12 LAB — BASIC METABOLIC PANEL
BUN: 11 mg/dL (ref 6–23)
Calcium: 7.5 mg/dL — ABNORMAL LOW (ref 8.4–10.5)
Creatinine, Ser: 1.31 mg/dL — ABNORMAL HIGH (ref 0.50–1.10)
GFR calc Af Amer: 52 mL/min — ABNORMAL LOW (ref >90)
GFR calc non Af Amer: 45 mL/min — ABNORMAL LOW (ref >90)

## 2013-10-12 LAB — GLUCOSE, CAPILLARY: Glucose-Capillary: 128 mg/dL — ABNORMAL HIGH (ref 70–99)

## 2013-10-12 LAB — GI PATHOGEN PANEL BY PCR, STOOL
C difficile toxin A/B: NEGATIVE
Campylobacter by PCR: NEGATIVE
Cryptosporidium by PCR: NEGATIVE
G lamblia by PCR: NEGATIVE
Rotavirus A by PCR: NEGATIVE
Shigella by PCR: NEGATIVE

## 2013-10-12 MED ORDER — POTASSIUM CHLORIDE 10 MEQ/100ML IV SOLN
10.0000 meq | INTRAVENOUS | Status: AC
  Administered 2013-10-12 (×2): 10 meq via INTRAVENOUS
  Filled 2013-10-12 (×2): qty 100

## 2013-10-12 MED ORDER — ZOLPIDEM TARTRATE 5 MG PO TABS
5.0000 mg | ORAL_TABLET | Freq: Once | ORAL | Status: AC
  Administered 2013-10-12: 5 mg via ORAL
  Filled 2013-10-12: qty 1

## 2013-10-12 MED ORDER — POTASSIUM CHLORIDE CRYS ER 20 MEQ PO TBCR
40.0000 meq | EXTENDED_RELEASE_TABLET | Freq: Two times a day (BID) | ORAL | Status: AC
  Administered 2013-10-12 – 2013-10-13 (×2): 40 meq via ORAL
  Filled 2013-10-12 (×2): qty 2

## 2013-10-12 MED ORDER — POTASSIUM CHLORIDE CRYS ER 20 MEQ PO TBCR
40.0000 meq | EXTENDED_RELEASE_TABLET | Freq: Two times a day (BID) | ORAL | Status: DC
  Administered 2013-10-12: 10:00:00 40 meq via ORAL
  Filled 2013-10-12 (×2): qty 2

## 2013-10-12 MED ORDER — MAGNESIUM SULFATE IN D5W 10-5 MG/ML-% IV SOLN
1.0000 g | Freq: Once | INTRAVENOUS | Status: AC
  Administered 2013-10-12: 1 g via INTRAVENOUS
  Filled 2013-10-12: qty 100

## 2013-10-12 NOTE — Progress Notes (Signed)
[facility] PROGRESS NOTE  Carrie [name] LOV:[id number] DOB: 07-Apr-1956 DOA: 10/09/2013 PCP: Laurel Dimmer, MD  Assessment/Plan: Pyelonephritis:  - admitted to med-surg  - started on IV fluids, anti emetics and pain control  - continue with rocephin  - urine cultures grew e coli pan sensitive. and blood cultures ordered and pending.  Uncontrolled diabetes mellitus:  - SSI.  - resume lantus.  - hgba1c pending.  Asthma:  Currently not wheezing, resume BD as needed.  Nausea vomiting:  Possibly from the pyelonephritis vs gastroparesis vs gastritis vs gastroenteritis  - symptomatic management and with IV reglan and protonix.  - started on clear liquid diet.  - advance as tolerated.  Hyponatremia:  - Dehydration vs hyperglycemia  - hydrate and repeat in am.   Hypokalemia: replete as needed.   DVT prophylaxis.  Code Status: full code  Family Communication: friend at bedside, updated and discussed the plan of care with patient  Disposition Plan: admitted to inpatient.    Consultants:  none  Procedures:  none  Antibiotics:  Rocephin 12/28  HPI/Subjective: Anxious ,.   Objective: Filed Vitals:   10/12/13 1318  BP: 124/74  Pulse: 95  Temp: 100.2 F (37.9 C)  Resp: 20    Intake/Output Summary (Last 24 hours) at 10/12/13 1901 Last data filed at 10/12/13 1800  Gross per 24 hour  Intake 2791.67 ml  Output   1900 ml  Net 891.67 ml   Filed Weights   10/09/13 1901 10/10/13 1230  Weight: 74.844 kg (165 lb) 77.429 kg (170 lb 11.2 oz)    Exam:   General:  Alert aferbrile comfortable  Cardiovascular: s1s2  Respiratory: ctab  Abdomen: sfot NT ND BS+  Musculoskeletal: NO PEDAL EDEMA  Data Reviewed: Basic Metabolic Panel:  Recent Labs Lab 10/09/13 1913 10/11/13 0520 10/12/13 0439 10/12/13 1600  NA 125* 130* 135  --   K 3.9 3.5 3.0* 3.6  CL 90* 98 106  --   CO2 20 20 19   --   GLUCOSE 377* 234* 164*  --   BUN 16 16 11   --    CREATININE 1.21* 1.45* 1.31*  --   CALCIUM 9.0 7.7* 7.5*  --   MG  --   --   --  1.7   Liver Function Tests:  Recent Labs Lab 10/09/13 1913  AST 39*  ALT 20  ALKPHOS 74  BILITOT 0.8  PROT 8.1  ALBUMIN 3.3*    Recent Labs Lab 10/09/13 1913  LIPASE 30   No results found for this basename: AMMONIA,  in the last 168 hours CBC:  Recent Labs Lab 10/09/13 1913 10/11/13 0520  WBC 7.9 9.5  NEUTROABS 5.5  --   HGB 14.4 12.4  HCT 41.3 36.9  MCV 83.4 84.6  PLT 156 108*   Cardiac Enzymes: No results found for this basename: CKTOTAL, CKMB, CKMBINDEX, TROPONINI,  in the last 168 hours BNP (last 3 results) No results found for this basename: PROBNP,  in the last 8760 hours CBG:  Recent Labs Lab 10/11/13 1801 10/11/13 2141 10/12/13 0742 10/12/13 1200 10/12/13 1715  GLUCAP 152* 187* 75 128* 105*    Recent Results (from the past 240 hour(s))  URINE CULTURE     Status: None   Collection Time    10/09/13  7:32 PM      Result Value Range Status   Specimen Description URINE, CLEAN CATCH   Final   Special Requests NONE   Final   Culture  Setup Time     Final   Value: 10/10/2013 00:19     Performed at [place]     Final   Value: >=100,000 COLONIES/ML     Performed at [facility]   Culture     Final   Value: ESCHERICHIA COLI     Performed at [facility]   Report Status 10/11/2013 FINAL   Final   Organism ID, Bacteria ESCHERICHIA COLI   Final  CULTURE, BLOOD (ROUTINE X 2)     Status: None   Collection Time    10/10/13  4:14 PM      Result Value Range Status   Specimen Description BLOOD RIGHT HAND   Final   Special Requests BOTTLES DRAWN AEROBIC AND ANAEROBIC 5CC   Final   Culture  Setup Time     Final   Value: 10/10/2013 20:15     Performed at [facility]   Culture     Final   Value:        BLOOD CULTURE RECEIVED NO GROWTH TO DATE CULTURE WILL BE HELD FOR 5 DAYS BEFORE ISSUING A FINAL NEGATIVE REPORT      Performed at [facility]   Report Status PENDING   Incomplete  CULTURE, BLOOD (ROUTINE X 2)     Status: None   Collection Time    10/10/13  4:27 PM      Result Value Range Status   Specimen Description BLOOD LEFT ARM   Final   Special Requests BOTTLES DRAWN AEROBIC ONLY 5CC   Final   Culture  Setup Time     Final   Value: 10/10/2013 20:15     Performed at [facility]   Culture     Final   Value:        BLOOD CULTURE RECEIVED NO GROWTH TO DATE CULTURE WILL BE HELD FOR 5 DAYS BEFORE ISSUING A FINAL NEGATIVE REPORT     Performed at [facility]   Report Status PENDING   Incomplete  CLOSTRIDIUM DIFFICILE BY PCR     Status: None   Collection Time    10/11/13 11:05 AM      Result Value Range Status   C difficile by pcr NEGATIVE  NEGATIVE Final   Comment: Performed at [facility]     Studies: No results found.  Scheduled Meds: . cefTRIAXone (ROCEPHIN)  IV  1 g Intravenous Q24H  . enoxaparin (LOVENOX) injection  40 mg Subcutaneous Q24H  . insulin aspart  0-15 Units Subcutaneous TID WC  . insulin aspart  0-5 Units Subcutaneous QHS  . insulin glargine  50 Units Subcutaneous QHS  . metoCLOPramide (REGLAN) injection  5 mg Intravenous Q6H  . nystatin  5 mL Oral QID  . pantoprazole (PROTONIX) IV  40 mg Intravenous Q24H  . potassium chloride  40 mEq Oral BID  . sodium chloride  3 mL Intravenous Q12H   Continuous Infusions: . sodium chloride 100 mL/hr (10/12/13 1322)    Active Problems:   DIABETES MELLITUS, TYPE I   DEPRESSION   ASTHMA   Gastroparesis   Pyelonephritis   UTI (lower urinary tract infection)    Time spent: 35 min    Linkin Vizzini  [facility] Pager [phone]. If 7PM-7AM, please contact night-coverage at [email or web address], password [facility] 10/12/2013, 7:01 PM  LOS: 3 days

## 2013-10-12 NOTE — Care Management Note (Signed)
UR complete    Tyan Lasure,MSN,RN [phone] 

## 2013-10-13 DIAGNOSIS — R112 Nausea with vomiting, unspecified: Secondary | ICD-10-CM

## 2013-10-13 DIAGNOSIS — K3184 Gastroparesis: Secondary | ICD-10-CM

## 2013-10-13 LAB — BASIC METABOLIC PANEL
BUN: 6 mg/dL (ref 6–23)
Calcium: 7.9 mg/dL — ABNORMAL LOW (ref 8.4–10.5)
Creatinine, Ser: 1.13 mg/dL — ABNORMAL HIGH (ref 0.50–1.10)
GFR calc non Af Amer: 53 mL/min — ABNORMAL LOW (ref >90)
Glucose, Bld: 73 mg/dL (ref 70–99)

## 2013-10-13 LAB — HEMOGLOBIN A1C: Hgb A1c MFr Bld: 11 % — ABNORMAL HIGH (ref <5.7)

## 2013-10-13 LAB — GLUCOSE, CAPILLARY: Glucose-Capillary: 74 mg/dL (ref 70–99)

## 2013-10-13 MED ORDER — INSULIN GLARGINE 100 UNIT/ML ~~LOC~~ SOLN: 40.0000 [IU] | Freq: Every day | SUBCUTANEOUS | Status: DC

## 2013-10-13 MED ORDER — METOCLOPRAMIDE HCL 5 MG PO TABS: 5.0000 mg | ORAL_TABLET | Freq: Three times a day (TID) | ORAL | Status: DC

## 2013-10-13 MED ORDER — CEPHALEXIN 500 MG PO CAPS: 500.0000 mg | ORAL_CAPSULE | Freq: Two times a day (BID) | ORAL | Status: DC

## 2013-10-13 NOTE — Progress Notes (Signed)
Discharge to home, d/c instructions and follow up appointments done and was given to the patient, verbalized understanding. PIV removed no s/s of swelling or infiltration noted upon discharge.

## 2013-10-13 NOTE — Discharge Summary (Signed)
Physician Discharge Summary  Carrie [name] IWL:[id number] DOB: 06-Jul-1956 DOA: 10/09/2013  PCP: Laurel Dimmer, MD  Admit date: 10/09/2013 Discharge date: 10/13/2013  Time spent: 30 minutes  Recommendations for Outpatient Follow-up:  1. Follow up with PCP in one week.   Discharge Diagnoses:  Active Problems:   DIABETES MELLITUS, TYPE I   DEPRESSION   ASTHMA   Gastroparesis   Pyelonephritis   UTI (lower urinary tract infection)   Discharge Condition: improved.   Diet recommendation: carb modified diet  Filed Weights   10/09/13 1901 10/10/13 1230  Weight: 74.844 kg (165 lb) 77.429 kg (170 lb 11.2 oz)    History of present illness:  CHERYLN BALCOM is a 57 y.o. female with prior h/o hypertension, DM, came in for nausea , vomiting and flank pain and lower quadrant abd pain. On arrival to ED, she was found to be febrile , hyperglycemic and was found to have pyelonephritis. She was referred to medical service for admission. She was started on rocephina nd iv FLUIDS. She denies any other complaints.    Hospital Course:  Pyelonephritis:  - admitted to med-surg, started on IV fluids, anti emetics and pain control  - her symptoms resolved with the above treatment.  - urine cultures grew e coli pan sensitive. and blood cultures ordered and negative so far.  Uncontrolled diabetes mellitus:  - she had an episode of hypoglycemia on 50 units of Lantus.  - we were holding metformin and amaryl in the hospital.  - we will further decrease the dose of lantus to 40 UNITS at bedtime, resume metformin, continue to hold amaryl.  Her hgba1c is pending at this time . Recommend outpatient follow up with PCP in one week.  Asthma:  Currently not wheezing, resume BD as needed.   Nausea vomiting:  Possibly from the pyelonephritis vs gastroparesis vs gastritis vs gastroenteritis  - symptomatic management and with IV reglan and protonix.  - started on clear liquid diet, advance as tolerated.   - will discharge her on po reglan .  Hyponatremia:  - Dehydration vs hyperglycemia  - hydrate and repeat in am shows improvement.   Hypokalemia: repleted as needed.   Stage 1 CKD: probably secondary to dehydration. Her losartan was held during hospitalization, which can be resumed on discharge.    Procedures:  none  Consultations:  none  Discharge Exam: Filed Vitals:   10/13/13 0616  BP: 120/72  Pulse: 79  Temp: 98.2 F (36.8 C)  Resp: 18    General: alert afebrile comfortable Cardiovascular:s1s2 Respiratory: ctab  Discharge Instructions      Discharge Orders   Future Orders Complete By Expires   Diet Carb Modified  As directed    Discharge instructions  As directed    Comments:     Follow up with PCP in one week       Medication List    STOP taking these medications       glimepiride 4 MG tablet  Commonly known as:  AMARYL     nystatin 100000 UNIT/ML suspension  Commonly known as:  MYCOSTATIN      TAKE these medications       albuterol (2.5 MG/3ML) 0.083% nebulizer solution  Commonly known as:  PROVENTIL  Take 2.5 mg by nebulization every 4 (four) hours as needed for wheezing or shortness of breath.     PROAIR HFA 108 (90 BASE) MCG/ACT inhaler  Generic drug:  albuterol  Inhale 2 puffs into the lungs every  4 (four) hours as needed for wheezing or shortness of breath.     aspirin EC 81 MG tablet  Take 81 mg by mouth every morning.     calcium-vitamin D 500-200 MG-UNIT per tablet  Commonly known as:  OSCAL WITH D  Take 1 tablet by mouth every morning.     cephALEXin 500 MG capsule  Commonly known as:  KEFLEX  Take 1 capsule (500 mg total) by mouth 2 (two) times daily.     esomeprazole 40 MG capsule  Commonly known as:  NEXIUM  Take 40 mg by mouth at bedtime.     folic acid 1 MG tablet  Commonly known as:  FOLVITE  Take 1 mg by mouth every morning.     HUMIRA 40 MG/0.8ML injection  Generic drug:  adalimumab  Inject 40 mg into the  skin once a week. Sundays.     insulin glargine 100 UNIT/ML injection  Commonly known as:  LANTUS  Inject 0.4 mLs (40 Units total) into the skin at bedtime.     INVOKANA 300 MG Tabs  Generic drug:  Canagliflozin  Take 300 mg by mouth at bedtime.     losartan 50 MG tablet  Commonly known as:  COZAAR  Take 50 mg by mouth every morning.     metFORMIN 1000 MG tablet  Commonly known as:  GLUCOPHAGE  Take 1,000 mg by mouth 2 (two) times daily with a meal.     methotrexate 2.5 MG tablet  Commonly known as:  RHEUMATREX  Take 25 mg by mouth once a week. Caution:Chemotherapy. Protect from light.  Taken on Wednesdays.     metoCLOPramide 5 MG tablet  Commonly known as:  REGLAN  Take 1 tablet (5 mg total) by mouth 3 (three) times daily before meals.     RECLAST 5 MG/100ML Soln injection  Generic drug:  zoledronic acid  Inject 5 mg into the vein. Received yearly dose 06/11/13     traMADol 50 MG tablet  Commonly known as:  ULTRAM  Take 50-100 mg by mouth every 6 (six) hours as needed for pain.       No Known Allergies    The results of significant diagnostics from this hospitalization (including imaging, microbiology, ancillary and laboratory) are listed below for reference.    Significant Diagnostic Studies: Dg Chest 2 View  10/10/2013   CLINICAL DATA:  Shortness of Breath  EXAM: CHEST  2 VIEW  COMPARISON:  September 16, 2013  FINDINGS: Lungs are clear. Heart size and pulmonary vascularity are normal. No adenopathy. No bone lesions.  IMPRESSION: No edema or consolidation.   Electronically Signed   By: Lowella Grip M.D.   On: 10/10/2013 07:36   Ct Abdomen Pelvis W Contrast  09/16/2013   CLINICAL DATA:  Abdominal pain, possible bowel obstruction  EXAM: CT ABDOMEN AND PELVIS WITH CONTRAST  TECHNIQUE: Multidetector CT imaging of the abdomen and pelvis was performed using the standard protocol following bolus administration of intravenous contrast.  CONTRAST:  116m OMNIPAQUE IOHEXOL  300 MG/ML  SOLN  COMPARISON:  01/13/2013  FINDINGS: Lung bases are unremarkable. Sagittal images of the spine are unremarkable. Fatty infiltration of the liver. No calcified gallstones are noted within gallbladder. Atherosclerotic calcifications of abdominal aorta, SMA origin and bilateral iliac arteries are noted. No aortic aneurysm.  Pancreas, spleen and adrenal glands are unremarkable. Kidneys are symmetrical in size and enhancement. No hydronephrosis or hydroureter. No focal renal mass.  Delayed renal images shows bilateral renal symmetrical  excretion.  There is a tiny umbilical region hernia containing fat without evidence of acute complication. There is a lower abdominal wall right paramedian ventral hernia containing fat without evidence of acute complication measures 4.4 by 2.7 cm.  Minimal distended gallbladder without evidence of calcified gallstones. There is no evidence of small bowel obstruction. Minimal distended small bowel loops and lower abdomen without evidence of transition point in caliber. No mesenteric fluid collection. No pericecal inflammation.  Normal appendix visualized in coronal image 46.  The uterus and adnexa are unremarkable. The urinary bladder is unremarkable.  IMPRESSION: 1. 1. No evidence of small bowel obstruction. Minimal distended distal small bowel loops without transition point in caliber. Mild enteritis cannot be excluded. 2. Small ventral hernia containing fat without evidence of acute complication. 3. No hydronephrosis or hydroureter. 4. Normal appendix.  No pericecal inflammation. 5. Atherosclerotic vascular calcifications.   Electronically Signed   By: Lahoma Crocker M.D.   On: 09/16/2013 20:15   Dg Abd Acute W/chest  09/16/2013   CLINICAL DATA:  Abdominal pain  EXAM: ACUTE ABDOMEN SERIES (ABDOMEN 2 VIEW & CHEST 1 VIEW)  COMPARISON:  04/02/2013  FINDINGS: Cardiomediastinal silhouette is stable. No acute infiltrate or pleural effusion. No pulmonary edema. No free abdominal  air is noted. Mild distended small bowel loops mid abdomen with some air-fluid level suspicious for ileus or early bowel obstruction.  IMPRESSION: No acute disease within chest. Mild distended small bowel loops mid abdomen with some air-fluid level suspicious for ileus or early bowel obstruction.   Electronically Signed   By: Lahoma Crocker M.D.   On: 09/16/2013 18:26    Microbiology: Recent Results (from the past 240 hour(s))  URINE CULTURE     Status: None   Collection Time    10/09/13  7:32 PM      Result Value Range Status   Specimen Description URINE, CLEAN CATCH   Final   Special Requests NONE   Final   Culture  Setup Time     Final   Value: 10/10/2013 00:19     Performed at [facility]     Final   Value: >=100,000 COLONIES/ML     Performed at [facility]   Culture     Final   Value: ESCHERICHIA COLI     Performed at [facility]   Report Status 10/11/2013 FINAL   Final   Organism ID, Bacteria ESCHERICHIA COLI   Final  CULTURE, BLOOD (ROUTINE X 2)     Status: None   Collection Time    10/10/13  4:14 PM      Result Value Range Status   Specimen Description BLOOD RIGHT HAND   Final   Special Requests BOTTLES DRAWN AEROBIC AND ANAEROBIC 5CC   Final   Culture  Setup Time     Final   Value: 10/10/2013 20:15     Performed at [facility]   Culture     Final   Value:        BLOOD CULTURE RECEIVED NO GROWTH TO DATE CULTURE WILL BE HELD FOR 5 DAYS BEFORE ISSUING A FINAL NEGATIVE REPORT     Performed at [facility]   Report Status PENDING   Incomplete  CULTURE, BLOOD (ROUTINE X 2)     Status: None   Collection Time    10/10/13  4:27 PM      Result Value Range Status   Specimen Description BLOOD LEFT ARM  Final   Special Requests BOTTLES DRAWN AEROBIC ONLY 5CC   Final   Culture  Setup Time     Final   Value: 10/10/2013 20:15     Performed at [facility]   Culture     Final   Value:        BLOOD CULTURE RECEIVED NO  GROWTH TO DATE CULTURE WILL BE HELD FOR 5 DAYS BEFORE ISSUING A FINAL NEGATIVE REPORT     Performed at [facility]   Report Status PENDING   Incomplete  CLOSTRIDIUM DIFFICILE BY PCR     Status: None   Collection Time    10/11/13 11:05 AM      Result Value Range Status   C difficile by pcr NEGATIVE  NEGATIVE Final   Comment: Performed at [facility]: Basic Metabolic Panel:  Recent Labs Lab 10/09/13 1913 10/11/13 0520 10/12/13 0439 10/12/13 1600 10/13/13 0420  NA 125* 130* 135  --  139  K 3.9 3.5 3.0* 3.6 3.8  CL 90* 98 106  --  107  CO2 20 20 19   --  22  GLUCOSE 377* 234* 164*  --  73  BUN 16 16 11   --  6  CREATININE 1.21* 1.45* 1.31*  --  1.13*  CALCIUM 9.0 7.7* 7.5*  --  7.9*  MG  --   --   --  1.7 1.9   Liver Function Tests:  Recent Labs Lab 10/09/13 1913  AST 39*  ALT 20  ALKPHOS 74  BILITOT 0.8  PROT 8.1  ALBUMIN 3.3*    Recent Labs Lab 10/09/13 1913  LIPASE 30   No results found for this basename: AMMONIA,  in the last 168 hours CBC:  Recent Labs Lab 10/09/13 1913 10/11/13 0520  WBC 7.9 9.5  NEUTROABS 5.5  --   HGB 14.4 12.4  HCT 41.3 36.9  MCV 83.4 84.6  PLT 156 108*   Cardiac Enzymes: No results found for this basename: CKTOTAL, CKMB, CKMBINDEX, TROPONINI,  in the last 168 hours BNP: BNP (last 3 results) No results found for this basename: PROBNP,  in the last 8760 hours CBG:  Recent Labs Lab 10/12/13 1200 10/12/13 1715 10/12/13 2135 10/13/13 0741 10/13/13 0801  GLUCAP 128* 105* 134* 56* 74       Signed:  Zeena Starkel  [facility] 10/13/2013, 10:19 AM

## 2013-10-13 NOTE — Progress Notes (Signed)
Spoke with pt concerning PCP at [facility], availabilities are not until July 2015 with new Pt's.  Pt states that she would like to have appointment with Dr. Asa Lente in July. Pt's PCP retired, she is trying to get establish with a new PCP. Pt continued, that she will follow up after discharge at [facility].

## 2013-10-16 LAB — CULTURE, BLOOD (ROUTINE X 2)
Culture: NO GROWTH
Culture: NO GROWTH

## 2013-11-27 ENCOUNTER — Encounter: Payer: Self-pay | Admitting: Gastroenterology

## 2013-11-27 ENCOUNTER — Ambulatory Visit (INDEPENDENT_AMBULATORY_CARE_PROVIDER_SITE_OTHER): Payer: Medicare PPO | Admitting: Gastroenterology

## 2013-11-27 VITALS — BP 138/80 | HR 66 | Ht 60.0 in | Wt 168.4 lb

## 2013-11-27 DIAGNOSIS — K3184 Gastroparesis: Secondary | ICD-10-CM

## 2013-11-27 DIAGNOSIS — K222 Esophageal obstruction: Secondary | ICD-10-CM

## 2013-11-27 NOTE — Patient Instructions (Signed)

## 2013-11-27 NOTE — Assessment & Plan Note (Signed)
Patient continues to have mild nausea despite Reglan.  She will consider enrollment in another gastroparesis trial.

## 2013-11-27 NOTE — Assessment & Plan Note (Signed)
>>  ASSESSMENT AND PLAN FOR STRICTURE AND STENOSIS OF ESOPHAGUS WRITTEN ON 11/27/2013 10:57 AM BY Blenda Burdock D, MD  Patient is having recurrent dysphagia which undoubtedly is due to a distal esophageal stricture.  Recommendations #1 endoscopy with dilation

## 2013-11-27 NOTE — Assessment & Plan Note (Signed)
Patient is having recurrent dysphagia which undoubtedly is due to a distal esophageal stricture.  Recommendations #1 endoscopy with dilation

## 2013-11-27 NOTE — Progress Notes (Signed)
_                                                                                                                History of Present Illness: The patient has returned for evaluation of dysphagia.  She has a history of an esophageal stricture for which she had undergone dilation in 2011 and 2012.  She's complaining of dysphagia to solids.  She takes Nexium daily.  She has a history of gastroparesis and uses  Reglan.  She complains of nausea and bloating.  She was hospitalized in the latter part of December, 2014 for UTI, nausea and vomiting, and what turned out to be a Salmonella infection.    Past Medical History  Diagnosis Date  . GERD (gastroesophageal reflux disease)   . Gastritis   . Stricture and stenosis of esophagus   . Obesity   . Diabetes mellitus   . Depression   . Asthma   . Hyperlipemia   . Hernia   . Hypertension   . Allergy   . Anxiety   . Osteoporosis   . Tuberculosis     tested positive 2011, no symptoms, was on medicine for 6 months  . TIA (transient ischemic attack)   . Plantar fasciitis   . Arthritis     psoriatic arithritis, DDD   Past Surgical History  Procedure Laterality Date  . Knee surgery      Bilateral  replacement  . Cesarean section      x 2   . Breast surgery    . Joint replacement      bilat knee    family history includes Asthma in her brother and mother; Diabetes in her brother, father, sister, and another family member; Emphysema in her father and mother; Liver disease in her father; Sarcoidosis in her brother. There is no history of Colon cancer. Current Outpatient Prescriptions  Medication Sig Dispense Refill  . adalimumab (HUMIRA) 40 MG/0.8ML injection Inject 40 mg into the skin once a week. Sundays.      Marland Kitchen albuterol (PROAIR HFA) 108 (90 BASE) MCG/ACT inhaler Inhale 2 puffs into the lungs every 4 (four) hours as needed for wheezing or shortness of breath.      Marland Kitchen albuterol (PROVENTIL) (2.5 MG/3ML) 0.083% nebulizer  solution Take 2.5 mg by nebulization every 4 (four) hours as needed for wheezing or shortness of breath.       Marland Kitchen aspirin EC 81 MG tablet Take 81 mg by mouth every morning.      . calcium-vitamin D (OSCAL WITH D) 500-200 MG-UNIT per tablet Take 1 tablet by mouth every morning.       . Canagliflozin (INVOKANA) 300 MG TABS Take 300 mg by mouth at bedtime.       . cephALEXin (KEFLEX) 500 MG capsule Take 1 capsule (500 mg total) by mouth 2 (two) times daily.  8 capsule  0  . esomeprazole (NEXIUM) 40 MG capsule Take 40 mg by mouth at bedtime.      Marland Kitchen  folic acid (FOLVITE) 1 MG tablet Take 1 mg by mouth every morning.       Marland Kitchen HYDROcodone-acetaminophen (NORCO/VICODIN) 5-325 MG per tablet Take 1 tablet by mouth every 6 (six) hours as needed for moderate pain.      Marland Kitchen insulin glargine (LANTUS) 100 UNIT/ML injection Inject 0.4 mLs (40 Units total) into the skin at bedtime.  10 mL  2  . insulin lispro (HUMALOG) 100 UNIT/ML KiwkPen Inject 5 Units into the skin 3 (three) times daily.      Marland Kitchen losartan (COZAAR) 50 MG tablet Take 50 mg by mouth every morning.      . metFORMIN (GLUCOPHAGE) 1000 MG tablet Take 1,000 mg by mouth 2 (two) times daily with a meal.       . methotrexate (RHEUMATREX) 2.5 MG tablet Take 25 mg by mouth once a week. Caution:Chemotherapy. Protect from light.  Taken on Wednesdays.      . metoCLOPramide (REGLAN) 5 MG tablet Take 1 tablet (5 mg total) by mouth 3 (three) times daily before meals.  90 tablet  0  . zoledronic acid (RECLAST) 5 MG/100ML SOLN Inject 5 mg into the vein. Received yearly dose 06/11/13       No current facility-administered medications for this visit.   Allergies as of 11/27/2013  . (No Known Allergies)    reports that she has never smoked. She has never used smokeless tobacco. She reports that she does not drink alcohol or use illicit drugs.     Review of Systems: Pertinent positive and negative review of systems were noted in the above HPI section. All other review of  systems were otherwise negative.  Vital signs were reviewed in today's medical record Physical Exam: General: Well developed , well nourished, no acute distress Skin: anicteric Head: Normocephalic and atraumatic Eyes:  sclerae anicteric, EOMI Ears: Normal auditory acuity Mouth: No deformity or lesions Neck: Supple, no masses or thyromegaly Lungs: Clear throughout to auscultation Heart: Regular rate and rhythm; no murmurs, rubs or bruits Abdomen: Soft, non tender and non distended. No masses, hepatosplenomegaly or hernias noted. Normal Bowel sounds.  There is no succussion splash Rectal:deferred Musculoskeletal: Symmetrical with no gross deformities  Skin: No lesions on visible extremities Pulses:  Normal pulses noted Extremities: No clubbing, cyanosis, edema or deformities noted Neurological: Alert oriented x 4, grossly nonfocal Cervical Nodes:  No significant cervical adenopathy Inguinal Nodes: No significant inguinal adenopathy Psychological:  Alert and cooperative. Normal mood and affect  See Assessment and Plan under Problem List

## 2013-12-04 ENCOUNTER — Emergency Department (HOSPITAL_COMMUNITY): Payer: Medicare PPO

## 2013-12-04 ENCOUNTER — Observation Stay (HOSPITAL_COMMUNITY)
Admission: EM | Admit: 2013-12-04 | Discharge: 2013-12-05 | DRG: 313 | Disposition: A | Discharge status: Home / Self Care | Payer: Medicare PPO | Attending: Internal Medicine | Admitting: Internal Medicine

## 2013-12-04 ENCOUNTER — Encounter (HOSPITAL_COMMUNITY): Payer: Self-pay | Admitting: Emergency Medicine

## 2013-12-04 DIAGNOSIS — M129 Arthropathy, unspecified: Secondary | ICD-10-CM | POA: Diagnosis present

## 2013-12-04 DIAGNOSIS — IMO0002 Reserved for concepts with insufficient information to code with codable children: Secondary | ICD-10-CM | POA: Diagnosis present

## 2013-12-04 DIAGNOSIS — E118 Type 2 diabetes mellitus with unspecified complications: Secondary | ICD-10-CM | POA: Diagnosis present

## 2013-12-04 DIAGNOSIS — F3289 Other specified depressive episodes: Secondary | ICD-10-CM | POA: Diagnosis present

## 2013-12-04 DIAGNOSIS — Z96659 Presence of unspecified artificial knee joint: Secondary | ICD-10-CM

## 2013-12-04 DIAGNOSIS — E162 Hypoglycemia, unspecified: Secondary | ICD-10-CM | POA: Diagnosis present

## 2013-12-04 DIAGNOSIS — K219 Gastro-esophageal reflux disease without esophagitis: Secondary | ICD-10-CM | POA: Diagnosis present

## 2013-12-04 DIAGNOSIS — E785 Hyperlipidemia, unspecified: Secondary | ICD-10-CM | POA: Diagnosis present

## 2013-12-04 DIAGNOSIS — IMO0001 Reserved for inherently not codable concepts without codable children: Secondary | ICD-10-CM

## 2013-12-04 DIAGNOSIS — E669 Obesity, unspecified: Secondary | ICD-10-CM | POA: Diagnosis present

## 2013-12-04 DIAGNOSIS — E119 Type 2 diabetes mellitus without complications: Secondary | ICD-10-CM

## 2013-12-04 DIAGNOSIS — R05 Cough: Secondary | ICD-10-CM

## 2013-12-04 DIAGNOSIS — R0789 Other chest pain: Principal | ICD-10-CM | POA: Diagnosis present

## 2013-12-04 DIAGNOSIS — D72829 Elevated white blood cell count, unspecified: Secondary | ICD-10-CM | POA: Diagnosis present

## 2013-12-04 DIAGNOSIS — F411 Generalized anxiety disorder: Secondary | ICD-10-CM | POA: Diagnosis present

## 2013-12-04 DIAGNOSIS — E1165 Type 2 diabetes mellitus with hyperglycemia: Secondary | ICD-10-CM | POA: Diagnosis present

## 2013-12-04 DIAGNOSIS — K3184 Gastroparesis: Secondary | ICD-10-CM

## 2013-12-04 DIAGNOSIS — Z8673 Personal history of transient ischemic attack (TIA), and cerebral infarction without residual deficits: Secondary | ICD-10-CM

## 2013-12-04 DIAGNOSIS — Z7982 Long term (current) use of aspirin: Secondary | ICD-10-CM

## 2013-12-04 DIAGNOSIS — Z79899 Other long term (current) drug therapy: Secondary | ICD-10-CM

## 2013-12-04 DIAGNOSIS — F329 Major depressive disorder, single episode, unspecified: Secondary | ICD-10-CM | POA: Diagnosis present

## 2013-12-04 DIAGNOSIS — M810 Age-related osteoporosis without current pathological fracture: Secondary | ICD-10-CM | POA: Diagnosis present

## 2013-12-04 DIAGNOSIS — F32A Depression, unspecified: Secondary | ICD-10-CM | POA: Diagnosis present

## 2013-12-04 DIAGNOSIS — J45909 Unspecified asthma, uncomplicated: Secondary | ICD-10-CM | POA: Diagnosis present

## 2013-12-04 DIAGNOSIS — R059 Cough, unspecified: Secondary | ICD-10-CM

## 2013-12-04 DIAGNOSIS — Z794 Long term (current) use of insulin: Secondary | ICD-10-CM

## 2013-12-04 DIAGNOSIS — I700 Atherosclerosis of aorta: Secondary | ICD-10-CM | POA: Diagnosis present

## 2013-12-04 DIAGNOSIS — Z8611 Personal history of tuberculosis: Secondary | ICD-10-CM

## 2013-12-04 DIAGNOSIS — K222 Esophageal obstruction: Secondary | ICD-10-CM

## 2013-12-04 DIAGNOSIS — R079 Chest pain, unspecified: Secondary | ICD-10-CM | POA: Diagnosis present

## 2013-12-04 DIAGNOSIS — E1169 Type 2 diabetes mellitus with other specified complication: Secondary | ICD-10-CM

## 2013-12-04 DIAGNOSIS — I10 Essential (primary) hypertension: Secondary | ICD-10-CM | POA: Diagnosis present

## 2013-12-04 LAB — CBC WITH DIFFERENTIAL/PLATELET
Basophils Absolute: 0.1 10*3/uL (ref 0.0–0.1)
Basophils Relative: 1 % (ref 0–1)
Eosinophils Absolute: 0.2 10*3/uL (ref 0.0–0.7)
Eosinophils Relative: 1 % (ref 0–5)
HEMATOCRIT: 44 % (ref 36.0–46.0)
Hemoglobin: 15.2 g/dL — ABNORMAL HIGH (ref 12.0–15.0)
LYMPHS ABS: 5.2 10*3/uL — AB (ref 0.7–4.0)
LYMPHS PCT: 32 % (ref 12–46)
MCH: 29.4 pg (ref 26.0–34.0)
MCHC: 34.5 g/dL (ref 30.0–36.0)
MCV: 85.1 fL (ref 78.0–100.0)
MONO ABS: 1.3 10*3/uL — AB (ref 0.1–1.0)
Monocytes Relative: 8 % (ref 3–12)
Neutro Abs: 9.3 10*3/uL — ABNORMAL HIGH (ref 1.7–7.7)
Neutrophils Relative %: 58 % (ref 43–77)
PLATELETS: 306 10*3/uL (ref 150–400)
RBC: 5.17 MIL/uL — AB (ref 3.87–5.11)
RDW: 15.4 % (ref 11.5–15.5)
WBC: 16 10*3/uL — AB (ref 4.0–10.5)

## 2013-12-04 LAB — I-STAT TROPONIN, ED: Troponin i, poc: 0 ng/mL (ref 0.00–0.08)

## 2013-12-04 LAB — I-STAT CHEM 8, ED
BUN: 17 mg/dL (ref 6–23)
CREATININE: 1.1 mg/dL (ref 0.50–1.10)
Calcium, Ion: 1.18 mmol/L (ref 1.12–1.23)
Chloride: 100 mEq/L (ref 96–112)
Glucose, Bld: 88 mg/dL (ref 70–99)
HCT: 49 % — ABNORMAL HIGH (ref 36.0–46.0)
Hemoglobin: 16.7 g/dL — ABNORMAL HIGH (ref 12.0–15.0)
Potassium: 3.4 mEq/L — ABNORMAL LOW (ref 3.7–5.3)
SODIUM: 141 meq/L (ref 137–147)
TCO2: 25 mmol/L (ref 0–100)

## 2013-12-04 LAB — URINE MICROSCOPIC-ADD ON

## 2013-12-04 LAB — COMPREHENSIVE METABOLIC PANEL
ALBUMIN: 3.7 g/dL (ref 3.5–5.2)
ALT: 17 U/L (ref 0–35)
AST: 41 U/L — ABNORMAL HIGH (ref 0–37)
Alkaline Phosphatase: 77 U/L (ref 39–117)
BUN: 12 mg/dL (ref 6–23)
CO2: 23 mEq/L (ref 19–32)
Calcium: 9.6 mg/dL (ref 8.4–10.5)
Chloride: 99 mEq/L (ref 96–112)
Creatinine, Ser: 0.95 mg/dL (ref 0.50–1.10)
GFR calc Af Amer: 76 mL/min — ABNORMAL LOW (ref >90)
GFR calc non Af Amer: 66 mL/min — ABNORMAL LOW (ref >90)
Glucose, Bld: 80 mg/dL (ref 70–99)
POTASSIUM: 3.8 meq/L (ref 3.7–5.3)
SODIUM: 138 meq/L (ref 137–147)
TOTAL PROTEIN: 8.5 g/dL — AB (ref 6.0–8.3)
Total Bilirubin: 0.3 mg/dL (ref 0.3–1.2)

## 2013-12-04 LAB — CBC
HCT: 41.5 % (ref 36.0–46.0)
HEMOGLOBIN: 14.2 g/dL (ref 12.0–15.0)
MCH: 29 pg (ref 26.0–34.0)
MCHC: 34.2 g/dL (ref 30.0–36.0)
MCV: 84.9 fL (ref 78.0–100.0)
Platelets: 258 10*3/uL (ref 150–400)
RBC: 4.89 MIL/uL (ref 3.87–5.11)
RDW: 15.6 % — AB (ref 11.5–15.5)
WBC: 15.5 10*3/uL — ABNORMAL HIGH (ref 4.0–10.5)

## 2013-12-04 LAB — CBG MONITORING, ED
GLUCOSE-CAPILLARY: 162 mg/dL — AB (ref 70–99)
Glucose-Capillary: 64 mg/dL — ABNORMAL LOW (ref 70–99)
Glucose-Capillary: 93 mg/dL (ref 70–99)
Glucose-Capillary: 75 mg/dL (ref 70–99)

## 2013-12-04 LAB — URINALYSIS, ROUTINE W REFLEX MICROSCOPIC
Bilirubin Urine: NEGATIVE
Glucose, UA: >1000 mg/dL — AB
KETONES UR: NEGATIVE mg/dL
NITRITE: NEGATIVE
Protein, ur: NEGATIVE mg/dL
SPECIFIC GRAVITY, URINE: 1.017 (ref 1.005–1.030)
UROBILINOGEN UA: 0.2 mg/dL (ref 0.0–1.0)
pH: 6 (ref 5.0–8.0)

## 2013-12-04 LAB — LIPASE, BLOOD: Lipase: 32 U/L (ref 11–59)

## 2013-12-04 LAB — GLUCOSE, CAPILLARY: Glucose-Capillary: 111 mg/dL — ABNORMAL HIGH (ref 70–99)

## 2013-12-04 LAB — TROPONIN I

## 2013-12-04 MED ORDER — HYDROCODONE-ACETAMINOPHEN 5-325 MG PO TABS: 1.0000 | ORAL_TABLET | ORAL | Status: DC | PRN

## 2013-12-04 MED ORDER — DOCUSATE SODIUM 100 MG PO CAPS
100.0000 mg | ORAL_CAPSULE | Freq: Two times a day (BID) | ORAL | Status: DC
Start: 2013-12-04 — End: 2013-12-05
  Filled 2013-12-04 (×2): qty 1

## 2013-12-04 MED ORDER — SODIUM CHLORIDE 0.9 % IV SOLN
INTRAVENOUS | Status: AC
  Administered 2013-12-05: 1000 mL via INTRAVENOUS

## 2013-12-04 MED ORDER — ENOXAPARIN SODIUM 40 MG/0.4ML ~~LOC~~ SOLN
40.0000 mg | SUBCUTANEOUS | Status: DC
  Administered 2013-12-05: 40 mg via SUBCUTANEOUS
  Filled 2013-12-04: qty 0.4

## 2013-12-04 MED ORDER — LOSARTAN POTASSIUM 50 MG PO TABS
50.0000 mg | ORAL_TABLET | Freq: Every morning | ORAL | Status: DC
  Administered 2013-12-05: 50 mg via ORAL
  Filled 2013-12-04: qty 1

## 2013-12-04 MED ORDER — ASPIRIN EC 81 MG PO TBEC
81.0000 mg | DELAYED_RELEASE_TABLET | Freq: Every morning | ORAL | Status: DC
  Administered 2013-12-05: 81 mg via ORAL
  Filled 2013-12-04: qty 1

## 2013-12-04 MED ORDER — SODIUM CHLORIDE 0.9 % IJ SOLN
3.0000 mL | Freq: Two times a day (BID) | INTRAMUSCULAR | Status: DC
  Administered 2013-12-05: 3 mL via INTRAVENOUS

## 2013-12-04 MED ORDER — ACETAMINOPHEN 325 MG PO TABS: 650.0000 mg | ORAL_TABLET | Freq: Four times a day (QID) | ORAL | Status: DC | PRN

## 2013-12-04 MED ORDER — METOCLOPRAMIDE HCL 5 MG PO TABS
5.0000 mg | ORAL_TABLET | Freq: Three times a day (TID) | ORAL | Status: DC
  Administered 2013-12-05: 5 mg via ORAL
  Filled 2013-12-04 (×4): qty 1

## 2013-12-04 MED ORDER — ASPIRIN 81 MG PO CHEW
324.0000 mg | CHEWABLE_TABLET | Freq: Once | ORAL | Status: AC
  Administered 2013-12-04: 324 mg via ORAL
  Filled 2013-12-04 (×2): qty 4

## 2013-12-04 MED ORDER — DEXTROSE 50 % IV SOLN
25.0000 mL | Freq: Once | INTRAVENOUS | Status: AC | PRN
  Filled 2013-12-04: qty 50

## 2013-12-04 MED ORDER — ACETAMINOPHEN 650 MG RE SUPP: 650.0000 mg | Freq: Four times a day (QID) | RECTAL | Status: DC | PRN

## 2013-12-04 MED ORDER — DEXTROSE 50 % IV SOLN: 50.0000 mL | Freq: Once | INTRAVENOUS | Status: AC | PRN

## 2013-12-04 MED ORDER — ONDANSETRON HCL 4 MG/2ML IJ SOLN: 4.0000 mg | Freq: Four times a day (QID) | INTRAMUSCULAR | Status: DC | PRN

## 2013-12-04 MED ORDER — PANTOPRAZOLE SODIUM 40 MG PO TBEC
40.0000 mg | DELAYED_RELEASE_TABLET | Freq: Every day | ORAL | Status: DC
  Administered 2013-12-05: 40 mg via ORAL
  Filled 2013-12-04: qty 1

## 2013-12-04 MED ORDER — DEXTROSE 50 % IV SOLN
1.0000 | Freq: Once | INTRAVENOUS | Status: AC
  Administered 2013-12-04: 50 mL via INTRAVENOUS
  Filled 2013-12-04: qty 50

## 2013-12-04 MED ORDER — ALPRAZOLAM 0.25 MG PO TABS: 0.2500 mg | ORAL_TABLET | Freq: Three times a day (TID) | ORAL | Status: DC | PRN

## 2013-12-04 MED ORDER — ONDANSETRON HCL 4 MG PO TABS: 4.0000 mg | ORAL_TABLET | Freq: Four times a day (QID) | ORAL | Status: DC | PRN

## 2013-12-04 MED ORDER — ALBUTEROL SULFATE (2.5 MG/3ML) 0.083% IN NEBU: 2.5000 mg | INHALATION_SOLUTION | RESPIRATORY_TRACT | Status: DC | PRN

## 2013-12-04 MED ORDER — GLUCOSE 40 % PO GEL: 1.0000 | ORAL | Status: DC | PRN

## 2013-12-04 MED ORDER — POTASSIUM CHLORIDE CRYS ER 20 MEQ PO TBCR
20.0000 meq | EXTENDED_RELEASE_TABLET | Freq: Once | ORAL | Status: AC
  Administered 2013-12-04: 20 meq via ORAL
  Filled 2013-12-04: qty 1

## 2013-12-04 MED ORDER — GLUCOSE-VITAMIN C 4-6 GM-MG PO CHEW
4.0000 | CHEWABLE_TABLET | ORAL | Status: DC | PRN
  Filled 2013-12-04: qty 4

## 2013-12-04 MED ORDER — INSULIN ASPART 100 UNIT/ML ~~LOC~~ SOLN: 0.0000 [IU] | SUBCUTANEOUS | Status: DC

## 2013-12-04 NOTE — ED Notes (Signed)
Gave pt 2 cups of OJ

## 2013-12-04 NOTE — ED Notes (Signed)
Pt had episode of active chest pain. Dr. Rockey Situ not to call report for another 30 minutes to make sure she is not having anymore pain.

## 2013-12-04 NOTE — ED Provider Notes (Signed)
Medical screening examination/treatment/procedure(s) were performed by non-physician practitioner and as supervising physician I was immediately available for consultation/collaboration.  EKG Interpretation   None         Malvin Johns, MD 12/04/13 2016

## 2013-12-04 NOTE — ED Notes (Signed)
Pt's first CBG was 64. Pt has hx of diabetes. Pt given orange juice and is feeling better. Second CBG was 93.

## 2013-12-04 NOTE — ED Provider Notes (Signed)
CSN: [id number]     Arrival date & time 12/04/13  1713 History   First MD Initiated Contact with Patient 12/04/13 1756     Chief Complaint  Patient presents with  . Chest Pain  . Back Pain  . Hypoglycemia     (Consider location/radiation/quality/duration/timing/severity/associated sxs/prior Treatment) HPI Comments: Patient presents emergency department with chief complaint of hypoglycemia, chest pain, and back pain. She states that her symptoms started this morning. She states that she has felt clammy, and jittery. She states that her blood sugar normally runs in the 3 to 400s, but when she recently checked that it was 64. She also states that she's been having some chest pain with associated shortness of breath and diaphoresis. The pain does not radiate. She states the pain is moderate. She has not tried taking anything to alleviate her symptoms. She normally takes an aspirin daily, but has been holding aspirin for the past couple of days because she is going to have bladder biopsy early next week. She has had a heart catheterization which was reportedly negative couple of years ago by [employer]. She has not followed up with a cardiologist since this procedure.  The history is provided by the patient. No language interpreter was used.    Past Medical History  Diagnosis Date  . GERD (gastroesophageal reflux disease)   . Gastritis   . Stricture and stenosis of esophagus   . Obesity   . Diabetes mellitus   . Depression   . Asthma   . Hyperlipemia   . Hernia   . Hypertension   . Allergy   . Anxiety   . Osteoporosis   . Tuberculosis     tested positive 2011, no symptoms, was on medicine for 6 months  . TIA (transient ischemic attack)   . Plantar fasciitis   . Arthritis     psoriatic arithritis, DDD   Past Surgical History  Procedure Laterality Date  . Knee surgery      Bilateral  replacement  . Cesarean section      x 2   . Breast surgery    . Joint replacement      bilat  knee    Family History  Problem Relation Age of Onset  . Colon cancer Neg Hx   . Diabetes      maternal aunts and uncles  . Diabetes Father   . Diabetes Sister   . Diabetes Brother   . Liver disease Father   . Sarcoidosis Brother   . Emphysema Mother     smoked  . Emphysema Father     smoked  . Asthma Mother   . Asthma Brother    History  Substance Use Topics  . Smoking status: Never Smoker   . Smokeless tobacco: Never Used  . Alcohol Use: No   OB History   Grav Para Term Preterm Abortions TAB SAB Ect Mult Living                 Review of Systems  All other systems reviewed and are negative.      Allergies  Review of patient's allergies indicates no known allergies.  Home Medications   Current Outpatient Rx  Name  Route  Sig  Dispense  Refill  . adalimumab (HUMIRA) 40 MG/0.8ML injection   Subcutaneous   Inject 40 mg into the skin once a week. Sundays.         Marland Kitchen albuterol (PROAIR HFA) 108 (90 BASE) MCG/ACT inhaler  Inhalation   Inhale 2 puffs into the lungs every 4 (four) hours as needed for wheezing or shortness of breath.         Marland Kitchen albuterol (PROVENTIL) (2.5 MG/3ML) 0.083% nebulizer solution   Nebulization   Take 2.5 mg by nebulization every 4 (four) hours as needed for wheezing or shortness of breath.          Marland Kitchen aspirin EC 81 MG tablet   Oral   Take 81 mg by mouth every morning.         . calcium-vitamin D (OSCAL WITH D) 500-200 MG-UNIT per tablet   Oral   Take 1 tablet by mouth every morning.          . Canagliflozin (INVOKANA) 300 MG TABS   Oral   Take 300 mg by mouth at bedtime.          . cephALEXin (KEFLEX) 500 MG capsule   Oral   Take 1 capsule (500 mg total) by mouth 2 (two) times daily.   8 capsule   0   . esomeprazole (NEXIUM) 40 MG capsule   Oral   Take 40 mg by mouth at bedtime.         . folic acid (FOLVITE) 1 MG tablet   Oral   Take 1 mg by mouth every morning.          Marland Kitchen HYDROcodone-acetaminophen  (NORCO/VICODIN) 5-325 MG per tablet   Oral   Take 1 tablet by mouth every 6 (six) hours as needed for moderate pain.         Marland Kitchen insulin glargine (LANTUS) 100 UNIT/ML injection   Subcutaneous   Inject 0.4 mLs (40 Units total) into the skin at bedtime.   10 mL   2   . insulin lispro (HUMALOG) 100 UNIT/ML KiwkPen   Subcutaneous   Inject 5 Units into the skin 3 (three) times daily.         Marland Kitchen losartan (COZAAR) 50 MG tablet   Oral   Take 50 mg by mouth every morning.         . metFORMIN (GLUCOPHAGE) 1000 MG tablet   Oral   Take 1,000 mg by mouth 2 (two) times daily with a meal.          . methotrexate (RHEUMATREX) 2.5 MG tablet   Oral   Take 25 mg by mouth once a week. Caution:Chemotherapy. Protect from light.  Taken on Wednesdays.         . metoCLOPramide (REGLAN) 5 MG tablet   Oral   Take 1 tablet (5 mg total) by mouth 3 (three) times daily before meals.   90 tablet   0   . zoledronic acid (RECLAST) 5 MG/100ML SOLN   Intravenous   Inject 5 mg into the vein. Received yearly dose 06/11/13          BP 137/66  Pulse 99  Temp(Src) 97.5 F (36.4 C) (Oral)  Resp 23  SpO2 100% Physical Exam  Nursing note and vitals reviewed. Constitutional: She is oriented to person, place, and time. She appears well-developed and well-nourished.  HENT:  Head: Normocephalic and atraumatic.  Eyes: Conjunctivae and EOM are normal. Pupils are equal, round, and reactive to light.  Neck: Normal range of motion. Neck supple.  Cardiovascular: Normal rate and regular rhythm.  Exam reveals no gallop and no friction rub.   No murmur heard. Pulmonary/Chest: Effort normal and breath sounds normal. No respiratory distress. She has no wheezes. She has  no rales. She exhibits no tenderness.  Abdominal: Soft. Bowel sounds are normal. She exhibits no distension and no mass. There is no tenderness. There is no rebound and no guarding.  Musculoskeletal: Normal range of motion. She exhibits no edema  and no tenderness.  Neurological: She is alert and oriented to person, place, and time.  Skin: Skin is warm and dry.  Psychiatric: She has a normal mood and affect. Her behavior is normal. Judgment and thought content normal.    ED Course  Procedures (including critical care time) Results for orders placed during the hospital encounter of 12/04/13  CBC WITH DIFFERENTIAL      Result Value Ref Range   WBC 16.0 (*) 4.0 - 10.5 K/uL   RBC 5.17 (*) 3.87 - 5.11 MIL/uL   Hemoglobin 15.2 (*) 12.0 - 15.0 g/dL   HCT 44.0  36.0 - 46.0 %   MCV 85.1  78.0 - 100.0 fL   MCH 29.4  26.0 - 34.0 pg   MCHC 34.5  30.0 - 36.0 g/dL   RDW 15.4  11.5 - 15.5 %   Platelets 306  150 - 400 K/uL   Neutrophils Relative % 58  43 - 77 %   Neutro Abs 9.3 (*) 1.7 - 7.7 K/uL   Lymphocytes Relative 32  12 - 46 %   Lymphs Abs 5.2 (*) 0.7 - 4.0 K/uL   Monocytes Relative 8  3 - 12 %   Monocytes Absolute 1.3 (*) 0.1 - 1.0 K/uL   Eosinophils Relative 1  0 - 5 %   Eosinophils Absolute 0.2  0.0 - 0.7 K/uL   Basophils Relative 1  0 - 1 %   Basophils Absolute 0.1  0.0 - 0.1 K/uL  URINALYSIS, ROUTINE W REFLEX MICROSCOPIC      Result Value Ref Range   Color, Urine YELLOW  YELLOW   APPearance CLEAR  CLEAR   Specific Gravity, Urine 1.017  1.005 - 1.030   pH 6.0  5.0 - 8.0   Glucose, UA >1000 (*) NEGATIVE mg/dL   Hgb urine dipstick LARGE (*) NEGATIVE   Bilirubin Urine NEGATIVE  NEGATIVE   Ketones, ur NEGATIVE  NEGATIVE mg/dL   Protein, ur NEGATIVE  NEGATIVE mg/dL   Urobilinogen, UA 0.2  0.0 - 1.0 mg/dL   Nitrite NEGATIVE  NEGATIVE   Leukocytes, UA SMALL (*) NEGATIVE  LIPASE, BLOOD      Result Value Ref Range   Lipase 32  11 - 59 U/L  URINE MICROSCOPIC-ADD ON      Result Value Ref Range   Squamous Epithelial / LPF RARE  RARE   WBC, UA 3-6  <3 WBC/hpf   RBC / HPF 7-10  <3 RBC/hpf   Bacteria, UA RARE  RARE   Casts HYALINE CASTS (*) NEGATIVE  CBG MONITORING, ED      Result Value Ref Range   Glucose-Capillary 64 (*)  70 - 99 mg/dL  I-STAT TROPOININ, ED      Result Value Ref Range   Troponin i, poc 0.00  0.00 - 0.08 ng/mL   Comment 3           I-STAT CHEM 8, ED      Result Value Ref Range   Sodium 141  137 - 147 mEq/L   Potassium 3.4 (*) 3.7 - 5.3 mEq/L   Chloride 100  96 - 112 mEq/L   BUN 17  6 - 23 mg/dL   Creatinine, Ser 1.10  0.50 - 1.10 mg/dL  Glucose, Bld 88  70 - 99 mg/dL   Calcium, Ion 1.18  1.12 - 1.23 mmol/L   TCO2 25  0 - 100 mmol/L   Hemoglobin 16.7 (*) 12.0 - 15.0 g/dL   HCT 49.0 (*) 36.0 - 46.0 %  CBG MONITORING, ED      Result Value Ref Range   Glucose-Capillary 93  70 - 99 mg/dL      EKG Interpretation   None      ED ECG REPORT  I personally interpreted this EKG   Date: 12/04/2013   Rate:98  Rhythm: normal sinus rhythm  QRS Axis: normal  Intervals: normal  ST/T Wave abnormalities: nonspecific T wave changes  Conduction Disutrbances:none  Narrative Interpretation:   Old EKG Reviewed: changes noted, significantly less ST depression from prior, which was likely baseline wander.   MDM   Final diagnoses:  Chest pain    Patient was chest pain, diabetes, hypertension, and hyperlipidemia. She had a negative heart catheterization by cardiology 3 or 4 years ago. Initial troponin is negative. Initial EKG showed some ST depression in the lateral leads, this was reviewed with cardiology, who believes that this was a poor EKG. They recommended repeat. Repeat EKG is much better.  Cardiology still recommends admission to the hospital for cardiac rule out.  I have discussed the patient with Dr. Roel Cluck, who will admit the patient.        Montine Circle, PA-C 12/04/13 1926

## 2013-12-04 NOTE — ED Notes (Addendum)
Pt c/o central chest pain, upper back pain, SOB, and nausea starting "almost two hours ago."  Pain score 9/10.  Pt sts that she previously had this pain and she was admitted, but they could not find a cause.      Pt's CBG 64.  Pt reports that this is extremely low for her.

## 2013-12-04 NOTE — ED Notes (Signed)
CBG Result 162.

## 2013-12-04 NOTE — H&P (Addendum)
PCP: Laurel Dimmer, MD    Chief Complaint:  Chest pain  HPI: Carrie [name] is a 58 y.o. female   has a past medical history of GERD (gastroesophageal reflux disease); Gastritis; Stricture and stenosis of esophagus; Obesity; Diabetes mellitus; Depression; Asthma; Hyperlipemia; Hernia; Hypertension; Allergy; Anxiety; Osteoporosis; Tuberculosis; TIA (transient ischemic attack); Plantar fasciitis; and Arthritis.   Presented with  Reports pain starting around 4 pm this afternoon radiating to the back. Felt like a tons of bricks. She was short of breath and nauseated, severely diaphoretic. She had some confusion associated with this. Patient presented to ER and had noted to have some ST segment changes ER spoke to cardiology who recommended serial ECG and admission for observation. Repeat ECG was improved, troponin wnl. Patient had another episode when I was evaluating her and was found to be hypoglycemic down to 58. Nursing stuff stated patient had an earlier episode of hypoglycemia as well which improved with orange juice. Pateitn chekced her blood sugar in AM and it was elevated up to 500's she took 10 units of novolog but could not recheck afterwards. Patient have not had anything to eat since then. States had similar episode in 2012 and had cardiac cathetarizxation that did not show any significant disease I am unable to locate the records.  During interview patient had an other episode of chest pressure lasting 3 min with no ECG changes radiating to the back, somewhat reproducible by palpation. Hospitalist to admit for further evaluation  Review of Systems:    Pertinent positives include: chest pressure , diaphoresis, nausea, vomiting, tremors.  Constitutional:  No weight loss, night sweats, Fevers, chills, fatigue, weight loss  HEENT:  No headaches, Difficulty swallowing,Tooth/dental problems,Sore throat,  No sneezing, itching, ear ache, nasal congestion, post nasal drip,   Cardio-vascular:  No chest pain, Orthopnea, PND, anasarca, dizziness, palpitations.no Bilateral lower extremity swelling  GI:  No heartburn, indigestion, abdominal pain, diarrhea, change in bowel habits, loss of appetite, melena, blood in stool, hematemesis Resp:  no shortness of breath at rest. No dyspnea on exertion, No excess mucus, no productive cough, No non-productive cough, No coughing up of blood.No change in color of mucus.No wheezing. Skin:  no rash or lesions. No jaundice GU:  no dysuria, change in color of urine, no urgency or frequency. No straining to urinate.  No flank pain.  Musculoskeletal:  No joint pain or no joint swelling. No decreased range of motion. No back pain.  Psych:  No change in mood or affect. No depression or anxiety. No memory loss.  Neuro: no localizing neurological complaints, no tingling, no weakness, no double vision, no gait abnormality, no slurred speech, no confusion  Otherwise ROS are negative except for above, 10 systems were reviewed  Past Medical History: Past Medical History  Diagnosis Date  . GERD (gastroesophageal reflux disease)   . Gastritis   . Stricture and stenosis of esophagus   . Obesity   . Diabetes mellitus   . Depression   . Asthma   . Hyperlipemia   . Hernia   . Hypertension   . Allergy   . Anxiety   . Osteoporosis   . Tuberculosis     tested positive 2011, no symptoms, was on medicine for 6 months  . TIA (transient ischemic attack)   . Plantar fasciitis   . Arthritis     psoriatic arithritis, DDD   Past Surgical History  Procedure Laterality Date  . Knee surgery      Bilateral  replacement  . Cesarean section      x 2   . Breast surgery    . Joint replacement      bilat knee      Medications: Prior to Admission medications   Medication Sig Start Date End Date Taking? Authorizing Provider  adalimumab (HUMIRA) 40 MG/0.8ML injection Inject 40 mg into the skin once a week. Sundays.   Yes Historical  Provider, MD  albuterol (PROAIR HFA) 108 (90 BASE) MCG/ACT inhaler Inhale 2 puffs into the lungs every 4 (four) hours as needed for wheezing or shortness of breath.   Yes Historical Provider, MD  albuterol (PROVENTIL) (2.5 MG/3ML) 0.083% nebulizer solution Take 2.5 mg by nebulization every 4 (four) hours as needed for wheezing or shortness of breath.  10/20/12  Yes Historical Provider, MD  aspirin EC 81 MG tablet Take 81 mg by mouth every morning.   Yes Historical Provider, MD  calcium-vitamin D (OSCAL WITH D) 500-200 MG-UNIT per tablet Take 1 tablet by mouth every morning.    Yes Historical Provider, MD  Canagliflozin (INVOKANA) 300 MG TABS Take 300 mg by mouth at bedtime.    Yes Historical Provider, MD  esomeprazole (NEXIUM) 40 MG capsule Take 40 mg by mouth at bedtime.   Yes Historical Provider, MD  folic acid (FOLVITE) 1 MG tablet Take 1 mg by mouth every morning.    Yes Historical Provider, MD  glimepiride (AMARYL) 4 MG tablet Take 4 mg by mouth daily with breakfast.   Yes Historical Provider, MD  insulin glargine (LANTUS) 100 UNIT/ML injection Inject 50 Units into the skin at bedtime. 10/13/13  Yes Hosie Poisson, MD  insulin lispro (HUMALOG) 100 UNIT/ML KiwkPen Inject 5 Units into the skin 3 (three) times daily.   Yes Historical Provider, MD  losartan (COZAAR) 50 MG tablet Take 50 mg by mouth every morning.   Yes Historical Provider, MD  metFORMIN (GLUCOPHAGE) 1000 MG tablet Take 1,000 mg by mouth 2 (two) times daily with a meal.  03/29/11  Yes Historical Provider, MD  methotrexate (RHEUMATREX) 2.5 MG tablet Take 25 mg by mouth once a week. Caution:Chemotherapy. Protect from light.  Taken on Wednesdays.   Yes Historical Provider, MD  metoCLOPramide (REGLAN) 5 MG tablet Take 1 tablet (5 mg total) by mouth 3 (three) times daily before meals. 10/13/13  Yes Hosie Poisson, MD  zoledronic acid (RECLAST) 5 MG/100ML SOLN Inject 5 mg into the vein. Received yearly dose 06/11/13   Yes Historical Provider, MD     Allergies:  No Known Allergies  Social History:  Ambulatory  independently   Lives at  Home with family   reports that she has never smoked. She has never used smokeless tobacco. She reports that she does not drink alcohol or use illicit drugs.   Family History: family history includes Asthma in her brother and mother; Diabetes in her brother, father, sister, and another family member; Emphysema in her father and mother; Liver disease in her father; Sarcoidosis in her brother. There is no history of Colon cancer.    Physical Exam: Patient Vitals for the past 24 hrs:  BP Temp Temp src Pulse Resp SpO2  12/04/13 1954 136/76 mmHg 97.9 F (36.6 C) Oral 95 18 99 %  12/04/13 1745 137/66 mmHg 97.5 F (36.4 C) Oral 99 23 100 %  12/04/13 1728 151/75 mmHg 97.5 F (36.4 C) Oral 108 17 100 %    1. General:  in No Acute distress 2. Psychological: Alert and  Oriented 3. Head/ENT:   Moist   Mucous Membranes                          Head Non traumatic, neck supple                          Normal   Dentition 4. SKIN: normal   Skin turgor,  Skin clean Dry and intact no rash 5. Heart: Regular rate and rhythm no Murmur, Rub or gallop 6. Lungs: Clear to auscultation bilaterally, no wheezes or crackles   7. Abdomen: Soft, slight Right upper quadrant tenderness, Non distended 8. Lower extremities: no clubbing, cyanosis, or edema 9. Neurologically Grossly intact, moving all 4 extremities equally 10. MSK: Normal range of motion  body mass index is unknown because there is no weight on file.   Labs on Admission:   Recent Labs  12/04/13 1824  NA 141  K 3.4*  CL 100  GLUCOSE 88  BUN 17  CREATININE 1.10   No results found for this basename: AST, ALT, ALKPHOS, BILITOT, PROT, ALBUMIN,  in the last 72 hours  Recent Labs  12/04/13 1809  LIPASE 32    Recent Labs  12/04/13 1809 12/04/13 1824  WBC 16.0*  --   NEUTROABS 9.3*  --   HGB 15.2* 16.7*  HCT 44.0 49.0*  MCV 85.1  --    PLT 306  --    No results found for this basename: CKTOTAL, CKMB, CKMBINDEX, TROPONINI,  in the last 72 hours No results found for this basename: TSH, T4TOTAL, FREET3, T3FREE, THYROIDAB,  in the last 72 hours No results found for this basename: VITAMINB12, FOLATE, FERRITIN, TIBC, IRON, RETICCTPCT,  in the last 72 hours Lab Results  Component Value Date   HGBA1C 11.0* 10/13/2013    The CrCl is unknown because both a height and weight (above a minimum accepted value) are required for this calculation. ABG    Component Value Date/Time   HCO3 21.2 10/10/2013 0820   TCO2 25 12/04/2013 1824   ACIDBASEDEF 3.5* 10/10/2013 0820   O2SAT 67.5 10/10/2013 0820     Lab Results  Component Value Date   DDIMER 1.73* 03/27/2011     Other results:  I have pearsonaly reviewed this: ECG REPORT  Rate: 98  Rhythm: NSR ST&T Change: fist ECG showed slight ST depressions in inferior leads  UA slight hematuria   Cultures:    Component Value Date/Time   SDES BLOOD LEFT ARM 10/10/2013 1627   SPECREQUEST BOTTLES DRAWN AEROBIC ONLY 5CC 10/10/2013 1627   CULT  Value: NO GROWTH 5 DAYS Performed at [facility] 10/10/2013 1627   REPTSTATUS 10/16/2013 FINAL 10/10/2013 1627       Radiological Exams on Admission: Dg Chest Portable 1 View  12/04/2013   CLINICAL DATA:  Chest pain and shortness of breath  EXAM: PORTABLE CHEST - 1 VIEW  COMPARISON:  10/10/2013  FINDINGS: The heart size and mediastinal contours are within normal limits. Both lungs are clear. The visualized skeletal structures are unremarkable.  IMPRESSION: No active disease.   Electronically Signed   By: Daryll Brod M.D.   On: 12/04/2013 19:50    Chart has been reviewed  Assessment/Plan   58 yo F with Hx of DM, HTN here with atypical chest pain and hypoglycemia in the setting of poorly controlled DM  Present on Admission:  . Chest pain - atypical but given  risk factors will admit, cycle CE, serial ECG, AM lipid panel, NPO  overnight in case needs any testing in AM. Cardiology consult in AM or sooner if decompensates.  Marland Kitchen ANXIETY - likely contributing to symptoms, xanax low dose PRN . Diabetes mellitus type II, uncontrolled - Hold her home meds while hypoglycemia and NPO will need to restart in AM once blood sugar stabalizes . HTN (hypertension) - continue home meds . ASTHMA - albuterol PRN . Hypoglycemia - hypoglycemia protocol RUP tenderness - mild but will obtain CMET  Prophylaxis:  Lovenox, Protonix  CODE STATUS: FULL CODE  Other plan as per orders.  I have spent a total of 55 min on this admission  Romney Compean 12/04/2013, 8:00 PM

## 2013-12-05 ENCOUNTER — Inpatient Hospital Stay (HOSPITAL_COMMUNITY): Payer: Medicare PPO

## 2013-12-05 DIAGNOSIS — R079 Chest pain, unspecified: Secondary | ICD-10-CM

## 2013-12-05 DIAGNOSIS — K219 Gastro-esophageal reflux disease without esophagitis: Secondary | ICD-10-CM

## 2013-12-05 DIAGNOSIS — R059 Cough, unspecified: Secondary | ICD-10-CM

## 2013-12-05 DIAGNOSIS — R05 Cough: Secondary | ICD-10-CM

## 2013-12-05 DIAGNOSIS — I10 Essential (primary) hypertension: Secondary | ICD-10-CM

## 2013-12-05 DIAGNOSIS — K3184 Gastroparesis: Secondary | ICD-10-CM

## 2013-12-05 DIAGNOSIS — M129 Arthropathy, unspecified: Secondary | ICD-10-CM

## 2013-12-05 DIAGNOSIS — F329 Major depressive disorder, single episode, unspecified: Secondary | ICD-10-CM

## 2013-12-05 DIAGNOSIS — K222 Esophageal obstruction: Secondary | ICD-10-CM

## 2013-12-05 DIAGNOSIS — F3289 Other specified depressive episodes: Secondary | ICD-10-CM

## 2013-12-05 LAB — GLUCOSE, CAPILLARY
GLUCOSE-CAPILLARY: 160 mg/dL — AB (ref 70–99)
GLUCOSE-CAPILLARY: 184 mg/dL — AB (ref 70–99)
Glucose-Capillary: 179 mg/dL — ABNORMAL HIGH (ref 70–99)
Glucose-Capillary: 261 mg/dL — ABNORMAL HIGH (ref 70–99)

## 2013-12-05 LAB — HEMOGLOBIN A1C
HEMOGLOBIN A1C: 10 % — AB (ref <5.7)
MEAN PLASMA GLUCOSE: 240 mg/dL — AB (ref <117)

## 2013-12-05 MED ORDER — TECHNETIUM TC 99M SESTAMIBI - CARDIOLITE
30.0000 | Freq: Once | INTRAVENOUS | Status: AC | PRN
  Administered 2013-12-05: 11:00:00 30 via INTRAVENOUS

## 2013-12-05 MED ORDER — TECHNETIUM TC 99M SESTAMIBI GENERIC - CARDIOLITE
10.0000 | Freq: Once | INTRAVENOUS | Status: AC | PRN
  Administered 2013-12-05: 10 via INTRAVENOUS

## 2013-12-05 MED ORDER — REGADENOSON 0.4 MG/5ML IV SOLN
INTRAVENOUS | Status: AC
  Administered 2013-12-05: 0.4 mg
  Filled 2013-12-05: qty 5

## 2013-12-05 NOTE — Progress Notes (Signed)
[facility] PROGRESS NOTE   MANETTE DOTO JAS:[id number] DOB: [birth year] DOA: 12/04/2013 PCP: Laurel Dimmer, MD  HPI/Subjective: Presented with chest pain, currently chest pain free. Has reproducible tenderness  Assessment/Plan: Principal Problem:   Chest pain Active Problems:   ASTHMA   ANXIETY   Diabetes mellitus type II, uncontrolled   HTN (hypertension)   Hypoglycemia   Chest pain -Atypical chest pain but multiple risk factors. -Negative cardiac enzymes and serial EKG. -Stress test done and pending, cardiology following.  Diabetes mellitus type 2 -Home medication held, insulin sliding scale and carbohydrate modified diet.  Hypertension -Home medications continued.   Code Status: Full code Family Communication: Plan discussed with the patient. Disposition Plan: Remains inpatient   Consultants:  Cardiology  Procedures:  Stress test  Antibiotics:  None   Objective: Filed Vitals:   12/05/13 1332  BP: 153/85  Pulse: 93  Temp: 98.4 F (36.9 C)  Resp: 18    Intake/Output Summary (Last 24 hours) at 12/05/13 1350 Last data filed at 12/05/13 0102  Gross per 24 hour  Intake      3 ml  Output      0 ml  Net      3 ml   Filed Weights   12/04/13 2316 12/05/13 0400  Weight: 76.204 kg (168 lb) 76.204 kg (168 lb)    Exam: General: Alert and awake, oriented x3, not in any acute distress. HEENT: anicteric sclera, pupils reactive to light and accommodation, EOMI CVS: S1-S2 clear, no murmur rubs or gallops Chest: clear to auscultation bilaterally, no wheezing, rales or rhonchi Abdomen: soft nontender, nondistended, normal bowel sounds, no organomegaly Extremities: no cyanosis, clubbing or edema noted bilaterally Neuro: Cranial nerves II-XII intact, no focal neurological deficits  Data Reviewed: Basic Metabolic Panel:  Recent Labs Lab 12/04/13 1824 12/04/13 2243  NA 141 138  K 3.4* 3.8  CL 100 99  CO2  --  23  GLUCOSE 88 80    BUN 17 12  CREATININE 1.10 0.95  CALCIUM  --  9.6   Liver Function Tests:  Recent Labs Lab 12/04/13 2243  AST 41*  ALT 17  ALKPHOS 77  BILITOT 0.3  PROT 8.5*  ALBUMIN 3.7    Recent Labs Lab 12/04/13 1809  LIPASE 32   No results found for this basename: AMMONIA,  in the last 168 hours CBC:  Recent Labs Lab 12/04/13 1809 12/04/13 1824 12/04/13 2243  WBC 16.0*  --  15.5*  NEUTROABS 9.3*  --   --   HGB 15.2* 16.7* 14.2  HCT 44.0 49.0* 41.5  MCV 85.1  --  84.9  PLT 306  --  258   Cardiac Enzymes:  Recent Labs Lab 12/04/13 2243  TROPONINI <0.30   BNP (last 3 results) No results found for this basename: PROBNP,  in the last 8760 hours CBG:  Recent Labs Lab 12/04/13 2214 12/04/13 2319 12/05/13 0426 12/05/13 0746 12/05/13 1253  GLUCAP 75 111* 179* 160* 184*    Micro No results found for this or any previous visit (from the past 240 hour(s)).   Studies: Nm Myocar Multi W/spect W/wall Motion / Ef  12/05/2013   CLINICAL DATA:  Chest pain  EXAM: MYOCARDIAL IMAGING WITH SPECT (REST AND PHARMACOLOGIC-STRESS)  GATED LEFT VENTRICULAR WALL MOTION STUDY  LEFT VENTRICULAR EJECTION FRACTION  TECHNIQUE: Standard myocardial SPECT imaging was performed after resting intravenous injection of 10 mCi Tc-43msestamibi. Subsequently, intravenous infusion of Lexiscan was performed under the supervision of the Cardiology  staff. At peak effect of the drug, 30 mCi Tc-52msestamibi was injected intravenously and standard myocardial SPECT imaging was performed. Quantitative gated imaging was also performed to evaluate left ventricular wall motion, and estimate left ventricular ejection fraction.  COMPARISON:  None.  FINDINGS: SPECT: Along for breast attenuation artifact, there are no perfusion defects.  Wall motion:  Normal  Ejection fraction: 63%. End-diastolic volume 62 cc. End systolic volume 23 cc.  IMPRESSION: Lung for breast attenuation artifact, there are no perfusion defects.    Electronically Signed   By: AMaryclare BeanM.D.   On: 12/05/2013 13:45   Dg Chest Portable 1 View  12/04/2013   CLINICAL DATA:  Chest pain and shortness of breath  EXAM: PORTABLE CHEST - 1 VIEW  COMPARISON:  10/10/2013  FINDINGS: The heart size and mediastinal contours are within normal limits. Both lungs are clear. The visualized skeletal structures are unremarkable.  IMPRESSION: No active disease.   Electronically Signed   By: TDaryll BrodM.D.   On: 12/04/2013 19:50    Scheduled Meds: . aspirin EC  81 mg Oral q morning - 10a  . docusate sodium  100 mg Oral BID  . enoxaparin (LOVENOX) injection  40 mg Subcutaneous Q24H  . insulin aspart  0-9 Units Subcutaneous 6 times per day  . losartan  50 mg Oral q morning - 10a  . metoCLOPramide  5 mg Oral TID AC  . pantoprazole  40 mg Oral Daily  . sodium chloride  3 mL Intravenous Q12H   Continuous Infusions:      Time spent: 35 minutes    EPulaski Memorial HospitalA  [facility] Pager [phone] 7PM-7AM, please contact night-coverage at [email or web address], password [email or web address]2/21/2015, 1:50 PM  LOS: 1 day

## 2013-12-05 NOTE — Discharge Summary (Signed)
Physician Discharge Summary  Carrie [name] ZOX:[id number] DOB: [birth year] DOA: 12/04/2013  PCP: Laurel Dimmer, MD  Admit date: 12/04/2013 Discharge date: 12/05/2013  Time spent: 40 minutes  Recommendations for Outpatient Follow-up:  1. Follow up with PCP in 1 week  Discharge Diagnoses:  Principal Problem:   Chest pain Active Problems:   ASTHMA   ANXIETY   Diabetes mellitus type II, uncontrolled   HTN (hypertension)   Hypoglycemia   Discharge Condition: Stable  Diet recommendation: Carb modified diet  Filed Weights   12/04/13 2316 12/05/13 0400  Weight: 76.204 kg (168 lb) 76.204 kg (168 lb)    History of present illness:  Carrie [name] is a 58 y.o. female  has a past medical history of GERD (gastroesophageal reflux disease); Gastritis; Stricture and stenosis of esophagus; Obesity; Diabetes mellitus; Depression; Asthma; Hyperlipemia; Hernia; Hypertension; Allergy; Anxiety; Osteoporosis; Tuberculosis; TIA (transient ischemic attack); Plantar fasciitis; and Arthritis.  Presented with  Reports pain starting around 4 pm this afternoon radiating to the back. Felt like a tons of bricks. She was short of breath and nauseated, severely diaphoretic. She had some confusion associated with this. Patient presented to ER and had noted to have some ST segment changes ER spoke to cardiology who recommended serial ECG and admission for observation. Repeat ECG was improved, troponin wnl. Patient had another episode when I was evaluating her and was found to be hypoglycemic down to 58. Nursing stuff stated patient had an earlier episode of hypoglycemia as well which improved with orange juice. Pateitn chekced her blood sugar in AM and it was elevated up to 500's she took 10 units of novolog but could not recheck afterwards. Patient have not had anything to eat since then.  States had similar episode in 2012 and had cardiac cathetarizxation that did not show any significant disease I am unable to  locate the records.  During interview patient had an other episode of chest pressure lasting 3 min with no ECG changes radiating to the back, somewhat reproducible by palpation. Hospitalist to admit for further evaluation   Hospital Course:   Chest pain  -Atypical chest pain but multiple risk factors.  -Negative cardiac enzymes and serial EKG.  -Stress test done and showed no perfusion defect. -Discharge home, OTC Tylenol for the pain, Follow up with PCP.  Diabetes mellitus type 2  -Home medication held, insulin sliding scale and carbohydrate modified diet.  -Home meds restarted on discharge.  Hypertension  -Home medications continued.   Procedures:  Stress test : Negative  Consultations:  Cardiology  Discharge Exam: Filed Vitals:   12/05/13 1332  BP: 153/85  Pulse: 93  Temp: 98.4 F (36.9 C)  Resp: 18   General: Alert and awake, oriented x3, not in any acute distress. HEENT: anicteric sclera, pupils reactive to light and accommodation, EOMI CVS: S1-S2 clear, no murmur rubs or gallops Chest: clear to auscultation bilaterally, no wheezing, rales or rhonchi Abdomen: soft nontender, nondistended, normal bowel sounds, no organomegaly Extremities: no cyanosis, clubbing or edema noted bilaterally Neuro: Cranial nerves II-XII intact, no focal neurological deficits  Discharge Instructions  Discharge Orders   Future Appointments Provider Department Dept Phone   12/18/2013 3:30 PM Inda Castle, MD [place] [phone]   05/12/2014 9:30 AM Rowe Clack, MD [place] [phone]   Future Orders Complete By Expires   Diet Carb Modified  As directed    Increase activity slowly  As directed  Medication List         albuterol (2.5 MG/3ML) 0.083% nebulizer solution  Commonly known as:  PROVENTIL  Take 2.5 mg by nebulization every 4 (four) hours as needed for wheezing or shortness of breath.     PROAIR  HFA 108 (90 BASE) MCG/ACT inhaler  Generic drug:  albuterol  Inhale 2 puffs into the lungs every 4 (four) hours as needed for wheezing or shortness of breath.     aspirin EC 81 MG tablet  Take 81 mg by mouth every morning.     calcium-vitamin D 500-200 MG-UNIT per tablet  Commonly known as:  OSCAL WITH D  Take 1 tablet by mouth every morning.     esomeprazole 40 MG capsule  Commonly known as:  NEXIUM  Take 40 mg by mouth at bedtime.     folic acid 1 MG tablet  Commonly known as:  FOLVITE  Take 1 mg by mouth every morning.     glimepiride 4 MG tablet  Commonly known as:  AMARYL  Take 4 mg by mouth daily with breakfast.     HUMIRA 40 MG/0.8ML injection  Generic drug:  adalimumab  Inject 40 mg into the skin once a week. Sundays.     insulin glargine 100 UNIT/ML injection  Commonly known as:  LANTUS  Inject 50 Units into the skin at bedtime.     insulin lispro 100 UNIT/ML KiwkPen  Commonly known as:  HUMALOG  Inject 5 Units into the skin 3 (three) times daily.     INVOKANA 300 MG Tabs  Generic drug:  Canagliflozin  Take 300 mg by mouth at bedtime.     losartan 50 MG tablet  Commonly known as:  COZAAR  Take 50 mg by mouth every morning.     metFORMIN 1000 MG tablet  Commonly known as:  GLUCOPHAGE  Take 1,000 mg by mouth 2 (two) times daily with a meal.     methotrexate 2.5 MG tablet  Commonly known as:  RHEUMATREX  Take 25 mg by mouth once a week. Caution:Chemotherapy. Protect from light.  Taken on Wednesdays.     metoCLOPramide 5 MG tablet  Commonly known as:  REGLAN  Take 1 tablet (5 mg total) by mouth 3 (three) times daily before meals.     RECLAST 5 MG/100ML Soln injection  Generic drug:  zoledronic acid  Inject 5 mg into the vein. Received yearly dose 06/11/13       No Known Allergies     Follow-up Information   Follow up with Laurel Dimmer, MD In 1 week.   Specialty:  Family Medicine   Contact information:   [address] [place] [address] [phone]        The results of significant diagnostics from this hospitalization (including imaging, microbiology, ancillary and laboratory) are listed below for reference.    Significant Diagnostic Studies: Nm Myocar Multi W/spect W/wall Motion / Ef  12/05/2013   CLINICAL DATA:  Chest pain  EXAM: MYOCARDIAL IMAGING WITH SPECT (REST AND PHARMACOLOGIC-STRESS)  GATED LEFT VENTRICULAR WALL MOTION STUDY  LEFT VENTRICULAR EJECTION FRACTION  TECHNIQUE: Standard myocardial SPECT imaging was performed after resting intravenous injection of 10 mCi Tc-64msestamibi. Subsequently, intravenous infusion of Lexiscan was performed under the supervision of the Cardiology staff. At peak effect of the drug, 30 mCi Tc-936mestamibi was injected intravenously and standard myocardial SPECT imaging was performed. Quantitative gated imaging was also performed to evaluate left  ventricular wall motion, and estimate left ventricular ejection fraction.  COMPARISON:  None.  FINDINGS: SPECT: Along for breast attenuation artifact, there are no perfusion defects.  Wall motion:  Normal  Ejection fraction: 63%. End-diastolic volume 62 cc. End systolic volume 23 cc.  IMPRESSION: Lung for breast attenuation artifact, there are no perfusion defects.   Electronically Signed   By: Maryclare Bean M.D.   On: 12/05/2013 13:45   Dg Chest Portable 1 View  12/04/2013   CLINICAL DATA:  Chest pain and shortness of breath  EXAM: PORTABLE CHEST - 1 VIEW  COMPARISON:  10/10/2013  FINDINGS: The heart size and mediastinal contours are within normal limits. Both lungs are clear. The visualized skeletal structures are unremarkable.  IMPRESSION: No active disease.   Electronically Signed   By: Daryll Brod M.D.   On: 12/04/2013 19:50    Microbiology: No results found for this or any previous visit (from the past 240 hour(s)).   Labs: Basic Metabolic Panel:  Recent Labs Lab 12/04/13 1824  12/04/13 2243  NA 141 138  K 3.4* 3.8  CL 100 99  CO2  --  23  GLUCOSE 88 80  BUN 17 12  CREATININE 1.10 0.95  CALCIUM  --  9.6   Liver Function Tests:  Recent Labs Lab 12/04/13 2243  AST 41*  ALT 17  ALKPHOS 77  BILITOT 0.3  PROT 8.5*  ALBUMIN 3.7    Recent Labs Lab 12/04/13 1809  LIPASE 32   No results found for this basename: AMMONIA,  in the last 168 hours CBC:  Recent Labs Lab 12/04/13 1809 12/04/13 1824 12/04/13 2243  WBC 16.0*  --  15.5*  NEUTROABS 9.3*  --   --   HGB 15.2* 16.7* 14.2  HCT 44.0 49.0* 41.5  MCV 85.1  --  84.9  PLT 306  --  258   Cardiac Enzymes:  Recent Labs Lab 12/04/13 2243  TROPONINI <0.30   BNP: BNP (last 3 results) No results found for this basename: PROBNP,  in the last 8760 hours CBG:  Recent Labs Lab 12/04/13 2214 12/04/13 2319 12/05/13 0426 12/05/13 0746 12/05/13 1253  GLUCAP 75 111* 179* 160* 184*       Signed:  Prim Morace A  [facility] 12/05/2013, 4:15 PM

## 2013-12-05 NOTE — Consult Note (Addendum)
Admit date: 12/04/2013 Referring Physician  [facility] Primary Physician Laurel Dimmer, MD Primary Cardiologist  (prior Dr. Warren Lacy, cath Dr. Lia Foyer 2011) Reason for Consultation  Chest pain  HPI: 58 year old with DM, HTN, HL, GERD, esophageal stricture (Dr. Deatra Ina) who presented to [facility] ER with CP starting at about 4pm on 12/04/13 with radiation to back intermittently, "ton of bricks" with diaphoresis. Confusion. Felt like she may have been hypoglycemic.   First ECG showed baseline wander, poor quality and was interpreted as ST depression in lateral leads (during downsloping of ECG artifact). Repeat was reassuring with no ST change and subsequent (during episode of pain) was normal as well.   In 2011 had a similar experience. Had heart cath which no no significant CAD. Prior abdominal CT, personally viewed, shows aortic atherosclerosis.   Currently, she is feeling better. She has not had any further bouts of chest discomfort. She did state that when compressing her epigastric region/lower sternum region, she was feeling some reduplication of her chest discomfort.    PMH:   Past Medical History  Diagnosis Date  . GERD (gastroesophageal reflux disease)   . Gastritis   . Stricture and stenosis of esophagus   . Obesity   . Diabetes mellitus   . Depression   . Asthma   . Hyperlipemia   . Hernia   . Hypertension   . Allergy   . Anxiety   . Osteoporosis   . Tuberculosis     tested positive 2011, no symptoms, was on medicine for 6 months  . TIA (transient ischemic attack)   . Plantar fasciitis   . Arthritis     psoriatic arithritis, DDD    PSH:   Past Surgical History  Procedure Laterality Date  . Knee surgery      Bilateral  replacement  . Cesarean section      x 2   . Breast surgery    . Joint replacement      bilat knee    Allergies:  Review of patient's allergies indicates no known allergies. Prior to Admit Meds:   Prior to Admission medications   Medication  Sig Start Date End Date Taking? Authorizing Provider  adalimumab (HUMIRA) 40 MG/0.8ML injection Inject 40 mg into the skin once a week. Sundays.   Yes Historical Provider, MD  albuterol (PROAIR HFA) 108 (90 BASE) MCG/ACT inhaler Inhale 2 puffs into the lungs every 4 (four) hours as needed for wheezing or shortness of breath.   Yes Historical Provider, MD  albuterol (PROVENTIL) (2.5 MG/3ML) 0.083% nebulizer solution Take 2.5 mg by nebulization every 4 (four) hours as needed for wheezing or shortness of breath.  10/20/12  Yes Historical Provider, MD  aspirin EC 81 MG tablet Take 81 mg by mouth every morning.   Yes Historical Provider, MD  calcium-vitamin D (OSCAL WITH D) 500-200 MG-UNIT per tablet Take 1 tablet by mouth every morning.    Yes Historical Provider, MD  Canagliflozin (INVOKANA) 300 MG TABS Take 300 mg by mouth at bedtime.    Yes Historical Provider, MD  esomeprazole (NEXIUM) 40 MG capsule Take 40 mg by mouth at bedtime.   Yes Historical Provider, MD  folic acid (FOLVITE) 1 MG tablet Take 1 mg by mouth every morning.    Yes Historical Provider, MD  glimepiride (AMARYL) 4 MG tablet Take 4 mg by mouth daily with breakfast.   Yes Historical Provider, MD  insulin glargine (LANTUS) 100 UNIT/ML injection Inject 50 Units into the  skin at bedtime. 10/13/13  Yes Hosie Poisson, MD  insulin lispro (HUMALOG) 100 UNIT/ML KiwkPen Inject 5 Units into the skin 3 (three) times daily.   Yes Historical Provider, MD  losartan (COZAAR) 50 MG tablet Take 50 mg by mouth every morning.   Yes Historical Provider, MD  metFORMIN (GLUCOPHAGE) 1000 MG tablet Take 1,000 mg by mouth 2 (two) times daily with a meal.  03/29/11  Yes Historical Provider, MD  methotrexate (RHEUMATREX) 2.5 MG tablet Take 25 mg by mouth once a week. Caution:Chemotherapy. Protect from light.  Taken on Wednesdays.   Yes Historical Provider, MD  metoCLOPramide (REGLAN) 5 MG tablet Take 1 tablet (5 mg total) by mouth 3 (three) times daily before meals.  10/13/13  Yes Hosie Poisson, MD  zoledronic acid (RECLAST) 5 MG/100ML SOLN Inject 5 mg into the vein. Received yearly dose 06/11/13   Yes Historical Provider, MD   Fam HX:    Family History  Problem Relation Age of Onset  . Colon cancer Neg Hx   . Diabetes      maternal aunts and uncles  . Diabetes Father   . Diabetes Sister   . Diabetes Brother   . Liver disease Father   . Sarcoidosis Brother   . Emphysema Mother     smoked  . Emphysema Father     smoked  . Asthma Mother   . Asthma Brother    Social HX:    History   Social History  . Marital Status: Single    Spouse Name: N/A    Number of Children: 2  . Years of Education: N/A   Occupational History  . Disabled     Social History Main Topics  . Smoking status: Never Smoker   . Smokeless tobacco: Never Used  . Alcohol Use: No  . Drug Use: No  . Sexual Activity: Not on file   Other Topics Concern  . Not on file   Social History Narrative   Sodas daily      ROS:  Denies any melena, bleeding, vomiting, syncope, rashes, orthopnea, strokelike symptoms. Positive for occasional gastroparesis, stricture, esophageal dysmotility. All 11 ROS were addressed and are negative except what is stated in the HPI   Physical Exam: Blood pressure 146/90, pulse 85, temperature 98.4 F (36.9 C), temperature source Oral, resp. rate 18, height 5' (1.524 m), weight 168 lb (76.204 kg), SpO2 97.00%.   General: Well developed, well nourished, in no acute distress Head: Eyes PERRLA, No xanthomas.   Normal cephalic and atramatic  Lungs:   Clear bilaterally to auscultation and percussion. Normal respiratory effort. No wheezes, no rales. Heart:   HRRR S1 S2 Pulses are 2+ & equal. No murmur, rubs, gallops.  No carotid bruit. No JVD.  No abdominal bruits.  Abdomen: Bowel sounds are positive, abdomen soft and non-tender without masses. No hepatosplenomegaly. Msk:  Back normal. Normal strength and tone for age. Extremities:  No clubbing,  cyanosis or edema.  DP +1 Neuro: Alert and oriented X 3, non-focal, MAE x 4 GU: Deferred Rectal: Deferred Psych:  Good affect, responds appropriately      Labs: Lab Results  Component Value Date   WBC 15.5* 12/04/2013   HGB 14.2 12/04/2013   HCT 41.5 12/04/2013   MCV 84.9 12/04/2013   PLT 258 12/04/2013     Recent Labs Lab 12/04/13 2243  NA 138  K 3.8  CL 99  CO2 23  BUN 12  CREATININE 0.95  CALCIUM 9.6  PROT  8.5*  BILITOT 0.3  ALKPHOS 77  ALT 17  AST 41*  GLUCOSE 80    Recent Labs  12/04/13 2243  TROPONINI <0.30   Lab Results  Component Value Date   CHOL 122 02/26/2011   HDL 29* 02/26/2011   LDLCALC  Value: 56        Total Cholesterol/HDL:CHD Risk Coronary Heart Disease Risk Table                     Men   Women  1/2 Average Risk   3.4   3.3  Average Risk       5.0   4.4  2 X Average Risk   9.6   7.1  3 X Average Risk  23.4   11.0        Use the calculated Patient Ratio above and the CHD Risk Table to determine the patient's CHD Risk.        ATP III CLASSIFICATION (LDL):  <100     mg/dL   Optimal  100-129  mg/dL   Near or Above                    Optimal  130-159  mg/dL   Borderline  160-189  mg/dL   High  >190     mg/dL   Very High 02/26/2011   TRIG 184* 02/26/2011   Lab Results  Component Value Date   DDIMER 1.73* 03/27/2011     Radiology:  Dg Chest Portable 1 View  12/04/2013   CLINICAL DATA:  Chest pain and shortness of breath  EXAM: PORTABLE CHEST - 1 VIEW  COMPARISON:  10/10/2013  FINDINGS: The heart size and mediastinal contours are within normal limits. Both lungs are clear. The visualized skeletal structures are unremarkable.  IMPRESSION: No active disease.   Electronically Signed   By: Daryll Brod M.D.   On: 12/04/2013 19:50   Personally viewed.  EKG:  As above Personally viewed.   ASSESSMENT/PLAN:   58 year old with DM, HTN, HL, anxiety, GERD with stricture, prior cath 2011 normal with chest pain.  1. Chest pain - troponin reassuring. ECG on repeat  serially reassuring. NPO. Will order NUC stress test. Pain was somewhat reproducible by palpation. ? Anxiety component. May be esophageal as well. She has a history of stricture and currently is having food getting "stuck "periodically. She has an appointment next week with Dr. Deatra Ina. She has been diagnosed with gastroparesis. She also has an appointment coming up with urology because of "plaques "seen in her bladder.  2. DM - per primary team. Uncontrolled with elevated Hg A1c.   3. Leukocytosis - per primary team.   4. Aortic atherosclerosis-calcium seen on CT scan. With her diabetes, I would recommend statin therapy. Continue with aggressive secondary risk prevention.  Candee Furbish, MD  12/05/2013  6:44 AM

## 2013-12-05 NOTE — Progress Notes (Signed)
Lexiscan CL performed

## 2013-12-07 LAB — CBG MONITORING, ED
GLUCOSE-CAPILLARY: 110 mg/dL — AB (ref 70–99)
Glucose-Capillary: 58 mg/dL — ABNORMAL LOW (ref 70–99)

## 2013-12-18 ENCOUNTER — Encounter: Payer: Self-pay | Admitting: Gastroenterology

## 2013-12-18 ENCOUNTER — Ambulatory Visit (AMBULATORY_SURGERY_CENTER): Payer: Medicare PPO | Admitting: Gastroenterology

## 2013-12-18 VITALS — BP 142/85 | HR 82 | Temp 96.5°F | Resp 13 | Ht 60.0 in | Wt 168.0 lb

## 2013-12-18 DIAGNOSIS — R1310 Dysphagia, unspecified: Secondary | ICD-10-CM

## 2013-12-18 DIAGNOSIS — K222 Esophageal obstruction: Secondary | ICD-10-CM

## 2013-12-18 LAB — GLUCOSE, CAPILLARY
GLUCOSE-CAPILLARY: 104 mg/dL — AB (ref 70–99)
Glucose-Capillary: 131 mg/dL — ABNORMAL HIGH (ref 70–99)

## 2013-12-18 MED ORDER — SODIUM CHLORIDE 0.9 % IV SOLN: 500.0000 mL | INTRAVENOUS | Status: DC

## 2013-12-18 MED ORDER — METOCLOPRAMIDE HCL 5 MG PO TABS: 10.0000 mg | ORAL_TABLET | Freq: Three times a day (TID) | ORAL | Status: DC

## 2013-12-18 NOTE — Progress Notes (Signed)
Report to pacu rn, vss, bbs=clear 

## 2013-12-18 NOTE — Progress Notes (Addendum)
Pt has frequent dry cough in the admitting area

## 2013-12-18 NOTE — Op Note (Signed)
[place]  [address] [place], [id number]   ENDOSCOPY PROCEDURE REPORT  PATIENT: Carrie [name], Carrie [name]  MR#: [id number] BIRTHDATE: [birth year] , 26  yrs. old GENDER: Female ENDOSCOPIST: Inda Castle, MD REFERRED BY:  Eligah East, M.D. PROCEDURE DATE:  12/18/2013 PROCEDURE:  EGD, diagnostic and Maloney dilation of esophagus ASA CLASS:     Class II INDICATIONS:  Dysphagia.   Nausea. MEDICATIONS: MAC sedation, administered by CRNA, propofol (Diprivan) 140m IV, and Simethicone 0.6cc PO TOPICAL ANESTHETIC: Cetacaine Spray  DESCRIPTION OF PROCEDURE: After the risks benefits and alternatives of the procedure were thoroughly explained, informed consent was obtained.  The LB [id number] LB [id number] was introduced through the mouth and advanced to the second portion of the duodenum. Without limitations.  The instrument was slowly withdrawn as the mucosa was fully examined.      In the GE junction there was an early non-obstructing peptic stricture.  The 9 mm gastroscope easily traversed the stricture. In the GE junction there was an early non-obstructing peptic stricture.  The 9 mm gastroscope easily traversed the stricture. The remainder of the upper endoscopy exam was otherwise normal. Retroflexed views revealed no abnormalities.     The scope was then withdrawn from the patient.  Because of dysphagia a #52 [place] dilator was passed with minimal resistance.  There was no heme.  COMPLICATIONS: There were no complications.  ENDOSCOPIC IMPRESSION: dysphagia-status post MVenia Minksdilation  RECOMMENDATIONS: 1.  Increased Reglan to 10 mg one half hour a.c. and at bedtime 2.  office visit 4 weeks  REPEAT EXAM:  eSigned:  RInda Castle MD 12/18/2013 3:29 PM   CC:

## 2013-12-18 NOTE — Patient Instructions (Signed)
YOU HAD AN ENDOSCOPIC PROCEDURE TODAY AT [facility]: Refer to the procedure report that was given to you for any specific questions about what was found during the examination.  If the procedure report does not answer your questions, please call your gastroenterologist to clarify.  If you requested that your care partner not be given the details of your procedure findings, then the procedure report has been included in a sealed envelope for you to review at your convenience later.  YOU SHOULD EXPECT: Some feelings of bloating in the abdomen. Passage of more gas than usual.  Walking can help get rid of the air that was put into your GI tract during the procedure and reduce the bloating. If you had a lower endoscopy (such as a colonoscopy or flexible sigmoidoscopy) you may notice spotting of blood in your stool or on the toilet paper. If you underwent a bowel prep for your procedure, then you may not have a normal bowel movement for a few days.  DIET: Your first meal following the procedure should be a light meal and then it is ok to progress to your normal diet.  A half-sandwich or bowl of soup is an example of a good first meal.  Heavy or fried foods are harder to digest and may make you feel nauseous or bloated.  Likewise meals heavy in dairy and vegetables can cause extra gas to form and this can also increase the bloating.  Drink plenty of fluids but you should avoid alcoholic beverages for 24 hours.  ACTIVITY: Your care partner should take you home directly after the procedure.  You should plan to take it easy, moving slowly for the rest of the day.  You can resume normal activity the day after the procedure however you should NOT DRIVE or use heavy machinery for 24 hours (because of the sedation medicines used during the test).    SYMPTOMS TO REPORT IMMEDIATELY: A gastroenterologist can be reached at any hour.  During normal business hours, 8:30 AM to 5:00 PM Monday through Friday,  call [phone].  After hours and on weekends, please call the GI answering service at [phone] who will take a message and have the physician on call contact you.   Following upper endoscopy (EGD)  Vomiting of blood or coffee ground material  New chest pain or pain under the shoulder blades  Painful or persistently difficult swallowing  New shortness of breath  Fever of 100F or higher  Black, tarry-looking stools  FOLLOW UP: If any biopsies were taken you will be contacted by phone or by letter within the next 1-3 weeks.  Call your gastroenterologist if you have not heard about the biopsies in 3 weeks.  Our staff will call the home number listed on your records the next business day following your procedure to check on you and address any questions or concerns that you may have at that time regarding the information given to you following your procedure. This is a courtesy call and so if there is no answer at the home number and we have not heard from you through the emergency physician on call, we will assume that you have returned to your regular daily activities without incident.  SIGNATURES/CONFIDENTIALITY: You and/or your care partner have signed paperwork which will be entered into your electronic medical record.  These signatures attest to the fact that that the information above on your After Visit Summary has been reviewed and is understood.  Full responsibility  of the confidentiality of this discharge information lies with you and/or your care-partner.  Recommendations Increase Reglan to 10 mg one half hour before meals and at bedtime Office visit 4 weeks

## 2013-12-21 ENCOUNTER — Telehealth: Payer: Self-pay

## 2013-12-21 NOTE — Telephone Encounter (Signed)
  Follow up Call-  Call back number 12/18/2013 01/08/2013 05/16/2011  Post procedure Call Back phone  # [phone]  Permission to leave phone message Yes Yes -     Patient questions:  Do you have a fever, pain , or abdominal swelling? no Pain Score  0 *  Have you tolerated food without any problems? yes  Have you been able to return to your normal activities? yes  Do you have any questions about your discharge instructions: Diet   no Medications  no Follow up visit  no  Do you have questions or concerns about your Care? no  Actions: * If pain score is 4 or above: No action needed, pain <4.  No problems per the pt. Maw

## 2014-01-27 ENCOUNTER — Ambulatory Visit: Payer: Medicare PPO | Admitting: Gastroenterology

## 2014-05-12 ENCOUNTER — Ambulatory Visit: Payer: Medicare PPO | Admitting: Internal Medicine

## 2014-05-27 ENCOUNTER — Other Ambulatory Visit (HOSPITAL_COMMUNITY): Payer: Self-pay | Admitting: Orthopaedic Surgery

## 2014-05-27 DIAGNOSIS — M25562 Pain in left knee: Secondary | ICD-10-CM

## 2014-06-03 ENCOUNTER — Encounter (HOSPITAL_COMMUNITY)
Admission: RE | Admit: 2014-06-03 | Discharge: 2014-06-03 | Disposition: A | Discharge status: Home / Self Care | Payer: Medicare PPO | Source: Ambulatory Visit | Attending: Orthopaedic Surgery | Admitting: Orthopaedic Surgery

## 2014-06-03 DIAGNOSIS — M25569 Pain in unspecified knee: Secondary | ICD-10-CM | POA: Diagnosis not present

## 2014-06-03 DIAGNOSIS — M25562 Pain in left knee: Secondary | ICD-10-CM

## 2014-06-03 MED ORDER — TECHNETIUM TC 99M MEDRONATE IV KIT
25.0000 | PACK | Freq: Once | INTRAVENOUS | Status: AC | PRN
  Administered 2014-06-03: 25 via INTRAVENOUS

## 2014-06-07 ENCOUNTER — Encounter (HOSPITAL_COMMUNITY): Payer: Medicare PPO

## 2014-06-16 ENCOUNTER — Other Ambulatory Visit (HOSPITAL_COMMUNITY): Payer: Self-pay | Admitting: Rheumatology

## 2014-06-16 ENCOUNTER — Ambulatory Visit (HOSPITAL_COMMUNITY): Admission: RE | Admit: 2014-06-16 | Payer: Medicare PPO | Source: Ambulatory Visit

## 2014-07-08 ENCOUNTER — Other Ambulatory Visit (HOSPITAL_COMMUNITY): Payer: Self-pay | Admitting: Rheumatology

## 2014-07-08 ENCOUNTER — Ambulatory Visit (HOSPITAL_COMMUNITY)
Admission: RE | Admit: 2014-07-08 | Discharge: 2014-07-08 | Disposition: A | Discharge status: Home / Self Care | Payer: Medicare PPO | Source: Ambulatory Visit | Attending: Physician Assistant | Admitting: Physician Assistant

## 2014-07-08 ENCOUNTER — Encounter (HOSPITAL_COMMUNITY): Payer: Self-pay

## 2014-07-08 DIAGNOSIS — M818 Other osteoporosis without current pathological fracture: Secondary | ICD-10-CM | POA: Insufficient documentation

## 2014-07-08 DIAGNOSIS — T380X5A Adverse effect of glucocorticoids and synthetic analogues, initial encounter: Secondary | ICD-10-CM | POA: Diagnosis not present

## 2014-07-08 HISTORY — DX: Polyneuropathy, unspecified: G62.9

## 2014-07-08 MED ORDER — ZOLEDRONIC ACID 5 MG/100ML IV SOLN
5.0000 mg | Freq: Once | INTRAVENOUS | Status: AC
  Administered 2014-07-08: 5 mg via INTRAVENOUS
  Filled 2014-07-08: qty 100

## 2014-07-08 MED ORDER — SODIUM CHLORIDE 0.9 % IV SOLN
Freq: Once | INTRAVENOUS | Status: AC
  Administered 2014-07-08: 13:00:00 via INTRAVENOUS

## 2014-07-08 NOTE — Discharge Instructions (Signed)
Drink fluids/water as tolerated over next 72hrs Tylenol or Ibuprofen OTC as directed Continue calcium and Vit D as directed by your MDZoledronic Acid injection (Paget's Disease, Osteoporosis) What is this medicine? ZOLEDRONIC ACID (ZOE le dron ik AS id) lowers the amount of calcium loss from bone. It is used to treat Paget's disease and osteoporosis in women. This medicine may be used for other purposes; ask your health care provider or pharmacist if you have questions. COMMON BRAND NAME(S): Reclast, Zometa What should I tell my health care provider before I take this medicine? They need to know if you have any of these conditions: -aspirin-sensitive asthma -cancer, especially if you are receiving medicines used to treat cancer -dental disease or wear dentures -infection -kidney disease -low levels of calcium in the blood -past surgery on the parathyroid gland or intestines -receiving corticosteroids like dexamethasone or prednisone -an unusual or allergic reaction to zoledronic acid, other medicines, foods, dyes, or preservatives -pregnant or trying to get pregnant -breast-feeding How should I use this medicine? This medicine is for infusion into a vein. It is given by a health care professional in a hospital or clinic setting. Talk to your pediatrician regarding the use of this medicine in children. This medicine is not approved for use in children. Overdosage: If you think you have taken too much of this medicine contact a poison control center or emergency room at once. NOTE: This medicine is only for you. Do not share this medicine with others. What if I miss a dose? It is important not to miss your dose. Call your doctor or health care professional if you are unable to keep an appointment. What may interact with this medicine? -certain antibiotics given by injection -NSAIDs, medicines for pain and inflammation, like ibuprofen or naproxen -some diuretics like bumetanide,  furosemide -teriparatide This list may not describe all possible interactions. Give your health care provider a list of all the medicines, herbs, non-prescription drugs, or dietary supplements you use. Also tell them if you smoke, drink alcohol, or use illegal drugs. Some items may interact with your medicine. What should I watch for while using this medicine? Visit your doctor or health care professional for regular checkups. It may be some time before you see the benefit from this medicine. Do not stop taking your medicine unless your doctor tells you to. Your doctor may order blood tests or other tests to see how you are doing. Women should inform their doctor if they wish to become pregnant or think they might be pregnant. There is a potential for serious side effects to an unborn child. Talk to your health care professional or pharmacist for more information. You should make sure that you get enough calcium and vitamin D while you are taking this medicine. Discuss the foods you eat and the vitamins you take with your health care professional. Some people who take this medicine have severe bone, joint, and/or muscle pain. This medicine may also increase your risk for jaw problems or a broken thigh bone. Tell your doctor right away if you have severe pain in your jaw, bones, joints, or muscles. Tell your doctor if you have any pain that does not go away or that gets worse. Tell your dentist and dental surgeon that you are taking this medicine. You should not have major dental surgery while on this medicine. See your dentist to have a dental exam and fix any dental problems before starting this medicine. Take good care of your teeth while on  this medicine. Make sure you see your dentist for regular follow-up appointments. What side effects may I notice from receiving this medicine? Side effects that you should report to your doctor or health care professional as soon as possible: -allergic reactions  like skin rash, itching or hives, swelling of the face, lips, or tongue -anxiety, confusion, or depression -breathing problems -changes in vision -eye pain -feeling faint or lightheaded, falls -jaw pain, especially after dental work -mouth sores -muscle cramps, stiffness, or weakness -trouble passing urine or change in the amount of urine Side effects that usually do not require medical attention (report to your doctor or health care professional if they continue or are bothersome): -bone, joint, or muscle pain -constipation -diarrhea -fever -hair loss -irritation at site where injected -loss of appetite -nausea, vomiting -stomach upset -trouble sleeping -trouble swallowing -weak or tired This list may not describe all possible side effects. Call your doctor for medical advice about side effects. You may report side effects to FDA at 1-800-FDA-1088. Where should I keep my medicine? This drug is given in a hospital or clinic and will not be stored at home. NOTE: This sheet is a summary. It may not cover all possible information. If you have questions about this medicine, talk to your doctor, pharmacist, or health care provider.  2015, [employer]. (2013-03-16 10:03:48)

## 2014-07-13 ENCOUNTER — Encounter (HOSPITAL_BASED_OUTPATIENT_CLINIC_OR_DEPARTMENT_OTHER): Payer: Self-pay | Admitting: Emergency Medicine

## 2014-07-13 ENCOUNTER — Emergency Department (HOSPITAL_BASED_OUTPATIENT_CLINIC_OR_DEPARTMENT_OTHER)
Admission: EM | Admit: 2014-07-13 | Discharge: 2014-07-13 | Disposition: A | Discharge status: Home / Self Care | Payer: Medicare PPO | Attending: Emergency Medicine | Admitting: Emergency Medicine

## 2014-07-13 ENCOUNTER — Emergency Department (HOSPITAL_BASED_OUTPATIENT_CLINIC_OR_DEPARTMENT_OTHER): Payer: Medicare PPO

## 2014-07-13 DIAGNOSIS — F329 Major depressive disorder, single episode, unspecified: Secondary | ICD-10-CM | POA: Insufficient documentation

## 2014-07-13 DIAGNOSIS — E669 Obesity, unspecified: Secondary | ICD-10-CM | POA: Diagnosis not present

## 2014-07-13 DIAGNOSIS — Z79899 Other long term (current) drug therapy: Secondary | ICD-10-CM | POA: Insufficient documentation

## 2014-07-13 DIAGNOSIS — E785 Hyperlipidemia, unspecified: Secondary | ICD-10-CM | POA: Insufficient documentation

## 2014-07-13 DIAGNOSIS — Y9389 Activity, other specified: Secondary | ICD-10-CM | POA: Insufficient documentation

## 2014-07-13 DIAGNOSIS — I10 Essential (primary) hypertension: Secondary | ICD-10-CM | POA: Insufficient documentation

## 2014-07-13 DIAGNOSIS — S0993XA Unspecified injury of face, initial encounter: Secondary | ICD-10-CM | POA: Insufficient documentation

## 2014-07-13 DIAGNOSIS — S199XXA Unspecified injury of neck, initial encounter: Secondary | ICD-10-CM | POA: Diagnosis present

## 2014-07-13 DIAGNOSIS — J45909 Unspecified asthma, uncomplicated: Secondary | ICD-10-CM | POA: Insufficient documentation

## 2014-07-13 DIAGNOSIS — Z8673 Personal history of transient ischemic attack (TIA), and cerebral infarction without residual deficits: Secondary | ICD-10-CM | POA: Insufficient documentation

## 2014-07-13 DIAGNOSIS — Z7982 Long term (current) use of aspirin: Secondary | ICD-10-CM | POA: Insufficient documentation

## 2014-07-13 DIAGNOSIS — E119 Type 2 diabetes mellitus without complications: Secondary | ICD-10-CM | POA: Insufficient documentation

## 2014-07-13 DIAGNOSIS — Y92410 Unspecified street and highway as the place of occurrence of the external cause: Secondary | ICD-10-CM | POA: Insufficient documentation

## 2014-07-13 DIAGNOSIS — S298XXA Other specified injuries of thorax, initial encounter: Secondary | ICD-10-CM | POA: Insufficient documentation

## 2014-07-13 DIAGNOSIS — S239XXA Sprain of unspecified parts of thorax, initial encounter: Secondary | ICD-10-CM | POA: Insufficient documentation

## 2014-07-13 DIAGNOSIS — F3289 Other specified depressive episodes: Secondary | ICD-10-CM | POA: Insufficient documentation

## 2014-07-13 DIAGNOSIS — K219 Gastro-esophageal reflux disease without esophagitis: Secondary | ICD-10-CM | POA: Diagnosis not present

## 2014-07-13 DIAGNOSIS — R0789 Other chest pain: Secondary | ICD-10-CM

## 2014-07-13 DIAGNOSIS — F411 Generalized anxiety disorder: Secondary | ICD-10-CM | POA: Insufficient documentation

## 2014-07-13 DIAGNOSIS — S139XXA Sprain of joints and ligaments of unspecified parts of neck, initial encounter: Secondary | ICD-10-CM | POA: Insufficient documentation

## 2014-07-13 DIAGNOSIS — S161XXA Strain of muscle, fascia and tendon at neck level, initial encounter: Secondary | ICD-10-CM

## 2014-07-13 MED ORDER — CYCLOBENZAPRINE HCL 10 MG PO TABS: 10.0000 mg | ORAL_TABLET | Freq: Two times a day (BID) | ORAL | Status: DC | PRN

## 2014-07-13 MED ORDER — HYDROCODONE-ACETAMINOPHEN 5-325 MG PO TABS: 2.0000 | ORAL_TABLET | ORAL | Status: DC | PRN

## 2014-07-13 MED ORDER — NAPROXEN 375 MG PO TABS: 375.0000 mg | ORAL_TABLET | Freq: Two times a day (BID) | ORAL | Status: DC

## 2014-07-13 NOTE — ED Notes (Signed)
NP at bedside.
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
## 2014-07-14 NOTE — ED Provider Notes (Signed)
Medical screening examination/treatment/procedure(s) were performed by non-physician practitioner and as supervising physician I was immediately available for consultation/collaboration.   EKG Interpretation None        Malvin Johns, MD 07/14/14 1811

## 2014-07-27 ENCOUNTER — Encounter: Payer: Self-pay | Admitting: Gastroenterology

## 2014-09-16 ENCOUNTER — Ambulatory Visit: Payer: Medicare PPO | Admitting: Internal Medicine

## 2015-02-25 DIAGNOSIS — L559 Sunburn, unspecified: Secondary | ICD-10-CM | POA: Insufficient documentation

## 2015-02-28 ENCOUNTER — Encounter (HOSPITAL_COMMUNITY): Payer: Self-pay | Admitting: Nurse Practitioner

## 2015-02-28 ENCOUNTER — Emergency Department (HOSPITAL_COMMUNITY)
Admission: EM | Admit: 2015-02-28 | Discharge: 2015-02-28 | Disposition: A | Discharge status: Home / Self Care | Payer: Medicare PPO | Attending: Emergency Medicine | Admitting: Emergency Medicine

## 2015-02-28 DIAGNOSIS — Z7982 Long term (current) use of aspirin: Secondary | ICD-10-CM | POA: Diagnosis not present

## 2015-02-28 DIAGNOSIS — L559 Sunburn, unspecified: Secondary | ICD-10-CM | POA: Insufficient documentation

## 2015-02-28 DIAGNOSIS — F329 Major depressive disorder, single episode, unspecified: Secondary | ICD-10-CM | POA: Diagnosis not present

## 2015-02-28 DIAGNOSIS — T24001A Burn of unspecified degree of unspecified site of right lower limb, except ankle and foot, initial encounter: Secondary | ICD-10-CM

## 2015-02-28 DIAGNOSIS — Z8673 Personal history of transient ischemic attack (TIA), and cerebral infarction without residual deficits: Secondary | ICD-10-CM | POA: Diagnosis not present

## 2015-02-28 DIAGNOSIS — Z79899 Other long term (current) drug therapy: Secondary | ICD-10-CM | POA: Diagnosis not present

## 2015-02-28 DIAGNOSIS — J45909 Unspecified asthma, uncomplicated: Secondary | ICD-10-CM | POA: Insufficient documentation

## 2015-02-28 DIAGNOSIS — E119 Type 2 diabetes mellitus without complications: Secondary | ICD-10-CM | POA: Diagnosis not present

## 2015-02-28 DIAGNOSIS — E669 Obesity, unspecified: Secondary | ICD-10-CM | POA: Diagnosis not present

## 2015-02-28 DIAGNOSIS — B379 Candidiasis, unspecified: Secondary | ICD-10-CM

## 2015-02-28 DIAGNOSIS — Z794 Long term (current) use of insulin: Secondary | ICD-10-CM | POA: Insufficient documentation

## 2015-02-28 DIAGNOSIS — T24002A Burn of unspecified degree of unspecified site of left lower limb, except ankle and foot, initial encounter: Secondary | ICD-10-CM

## 2015-02-28 DIAGNOSIS — F419 Anxiety disorder, unspecified: Secondary | ICD-10-CM | POA: Insufficient documentation

## 2015-02-28 DIAGNOSIS — I10 Essential (primary) hypertension: Secondary | ICD-10-CM | POA: Insufficient documentation

## 2015-02-28 DIAGNOSIS — Z791 Long term (current) use of non-steroidal anti-inflammatories (NSAID): Secondary | ICD-10-CM | POA: Insufficient documentation

## 2015-02-28 DIAGNOSIS — M1990 Unspecified osteoarthritis, unspecified site: Secondary | ICD-10-CM | POA: Diagnosis not present

## 2015-02-28 DIAGNOSIS — G629 Polyneuropathy, unspecified: Secondary | ICD-10-CM | POA: Insufficient documentation

## 2015-02-28 DIAGNOSIS — K219 Gastro-esophageal reflux disease without esophagitis: Secondary | ICD-10-CM | POA: Diagnosis not present

## 2015-02-28 DIAGNOSIS — M810 Age-related osteoporosis without current pathological fracture: Secondary | ICD-10-CM | POA: Diagnosis not present

## 2015-02-28 DIAGNOSIS — Z8611 Personal history of tuberculosis: Secondary | ICD-10-CM | POA: Diagnosis not present

## 2015-02-28 LAB — CBC
HCT: 42.6 % (ref 36.0–46.0)
Hemoglobin: 13.8 g/dL (ref 12.0–15.0)
MCH: 29.9 pg (ref 26.0–34.0)
MCHC: 32.4 g/dL (ref 30.0–36.0)
MCV: 92.2 fL (ref 78.0–100.0)
Platelets: 245 10*3/uL (ref 150–400)
RBC: 4.62 MIL/uL (ref 3.87–5.11)
RDW: 13.8 % (ref 11.5–15.5)
WBC: 6.1 10*3/uL (ref 4.0–10.5)

## 2015-02-28 LAB — BASIC METABOLIC PANEL
Anion gap: 9 (ref 5–15)
BUN: 19 mg/dL (ref 6–20)
CO2: 25 mmol/L (ref 22–32)
Calcium: 9.1 mg/dL (ref 8.9–10.3)
Chloride: 104 mmol/L (ref 101–111)
Creatinine, Ser: 1.06 mg/dL — ABNORMAL HIGH (ref 0.44–1.00)
GFR calc Af Amer: >60 mL/min (ref >60)
GFR calc non Af Amer: 57 mL/min — ABNORMAL LOW (ref >60)
Glucose, Bld: 205 mg/dL — ABNORMAL HIGH (ref 65–99)
Potassium: 4.4 mmol/L (ref 3.5–5.1)
Sodium: 138 mmol/L (ref 135–145)

## 2015-02-28 MED ORDER — SILVER SULFADIAZINE 1 % EX CREA
TOPICAL_CREAM | Freq: Once | CUTANEOUS | Status: AC
  Administered 2015-02-28: 21:00:00 via TOPICAL
  Filled 2015-02-28: qty 85

## 2015-02-28 MED ORDER — TRAMADOL HCL 50 MG PO TABS: 50.0000 mg | ORAL_TABLET | Freq: Four times a day (QID) | ORAL | Status: DC | PRN

## 2015-02-28 MED ORDER — FLUCONAZOLE 100 MG PO TABS
150.0000 mg | ORAL_TABLET | Freq: Once | ORAL | Status: AC
  Administered 2015-02-28: 150 mg via ORAL
  Filled 2015-02-28: qty 2

## 2015-02-28 MED ORDER — OXYCODONE-ACETAMINOPHEN 5-325 MG PO TABS
1.0000 | ORAL_TABLET | Freq: Once | ORAL | Status: AC
  Administered 2015-02-28: 1 via ORAL
  Filled 2015-02-28: qty 1

## 2015-02-28 MED ORDER — FLUCONAZOLE 150 MG PO TABS: 150.0000 mg | ORAL_TABLET | Freq: Every day | ORAL | Status: DC
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
## 2015-02-28 NOTE — Discharge Instructions (Signed)
Please follow up with your primary care physician in 1-2 days. If you do not have one please call the [place] number listed above. Please take pain medication and/or muscle relaxants as prescribed and as needed for pain. Please do not drive on narcotic pain medication or on muscle relaxants. Please use the silvadene cream as prescribed. Please read all discharge instructions and return precautions.    Candida Infection A Candida infection (also called yeast, fungus, and Monilia infection) is an overgrowth of yeast that can occur anywhere on the body. A yeast infection commonly occurs in warm, moist body areas. Usually, the infection remains localized but can spread to become a systemic infection. A yeast infection may be a sign of a more severe disease such as diabetes, leukemia, or AIDS. A yeast infection can occur in both men and women. In women, Candida vaginitis is a vaginal infection. It is one of the most common causes of vaginitis. Men usually do not have symptoms or know they have an infection until other problems develop. Men may find out they have a yeast infection because their sex partner has a yeast infection. Uncircumcised men are more likely to get a yeast infection than circumcised men. This is because the uncircumcised glans is not exposed to air and does not remain as dry as that of a circumcised glans. Older adults may develop yeast infections around dentures. CAUSES  Women  Antibiotics.  Steroid medication taken for a long time.  Being overweight (obese).  Diabetes.  Poor immune condition.  Certain serious medical conditions.  Immune suppressive medications for organ transplant patients.  Chemotherapy.  Pregnancy.  Menstruation.  Stress and fatigue.  Intravenous drug use.  Oral contraceptives.  Wearing tight-fitting clothes in the crotch area.  Catching it from a sex partner who has a yeast infection.  Spermicide.  Intravenous,  urinary, or other catheters. Men  Catching it from a sex partner who has a yeast infection.  Having oral or anal sex with a person who has the infection.  Spermicide.  Diabetes.  Antibiotics.  Poor immune system.  Medications that suppress the immune system.  Intravenous drug use.  Intravenous, urinary, or other catheters. SYMPTOMS  Women  Thick, white vaginal discharge.  Vaginal itching.  Redness and swelling in and around the vagina.  Irritation of the lips of the vagina and perineum.  Blisters on the vaginal lips and perineum.  Painful sexual intercourse.  Low blood sugar (hypoglycemia).  Painful urination.  Bladder infections.  Intestinal problems such as constipation, indigestion, bad breath, bloating, increase in gas, diarrhea, or loose stools. Men  Men may develop intestinal problems such as constipation, indigestion, bad breath, bloating, increase in gas, diarrhea, or loose stools.  Dry, cracked skin on the penis with itching or discomfort.  Jock itch.  Dry, flaky skin.  Athlete's foot.  Hypoglycemia. DIAGNOSIS  Women  A history and an exam are performed.  The discharge may be examined under a microscope.  A culture may be taken of the discharge. Men  A history and an exam are performed.  Any discharge from the penis or areas of cracked skin will be looked at under the microscope and cultured.  Stool samples may be cultured. TREATMENT  Women  Vaginal antifungal suppositories and creams.  Medicated creams to decrease irritation and itching on the outside of the vagina.  Warm compresses to the perineal area to decrease swelling and discomfort.  Oral antifungal medications.  Medicated vaginal suppositories or cream  for repeated or recurrent infections.  Wash and dry the irritation areas before applying the cream.  Eating yogurt with Lactobacillus may help with prevention and treatment.  Sometimes painting the vagina with  gentian violet solution may help if creams and suppositories do not work. Men  Antifungal creams and oral antifungal medications.  Sometimes treatment must continue for 30 days after the symptoms go away to prevent recurrence. HOME CARE INSTRUCTIONS  Women  Use cotton underwear and avoid tight-fitting clothing.  Avoid colored, scented toilet paper and deodorant tampons or pads.  Do not douche.  Keep your diabetes under control.  Finish all the prescribed medications.  Keep your skin clean and dry.  Consume milk or yogurt with Lactobacillus-active culture regularly. If you get frequent yeast infections and think that is what the infection is, there are over-the-counter medications that you can get. If the infection does not show healing in 3 days, talk to your caregiver.  Tell your sex partner you have a yeast infection. Your partner may need treatment also, especially if your infection does not clear up or recurs. Men  Keep your skin clean and dry.  Keep your diabetes under control.  Finish all prescribed medications.  Tell your sex partner that you have a yeast infection so he or she can be treated if necessary. SEEK MEDICAL CARE IF:   Your symptoms do not clear up or worsen in one week after treatment.  You have an oral temperature above 102 F (38.9 C).  You have trouble swallowing or eating for a prolonged time.  You develop blisters on and around your vagina.  You develop vaginal bleeding and it is not your menstrual period.  You develop abdominal pain.  You develop intestinal problems as mentioned above.  You get weak or light-headed.  You have painful or increased urination.  You have pain during sexual intercourse. MAKE SURE YOU:   Understand these instructions.  Will watch your condition.  Will get help right away if you are not doing well or get worse. Document Released: 11/08/2004 Document Revised: 02/15/2014 Document Reviewed:  02/20/2010 [facility] Patient Information 2015 [place], [place]. This information is not intended to replace advice given to you by your health care provider. Make sure you discuss any questions you have with your health care provider.  Burn Care Your skin is a natural barrier to infection. It is the largest organ of your body. Burns damage this natural protection. To help prevent infection, it is very important to follow your caregiver's instructions in the care of your burn. Burns are classified as:  First degree. There is only redness of the skin (erythema). No scarring is expected.  Second degree. There is blistering of the skin. Scarring may occur with deeper burns.  Third degree. All layers of the skin are injured, and scarring is expected. HOME CARE INSTRUCTIONS   Wash your hands well before changing your bandage.  Change your bandage as often as directed by your caregiver.  Remove the old bandage. If the bandage sticks, you may soak it off with cool, clean water.  Cleanse the burn thoroughly but gently with mild soap and water.  Pat the area dry with a clean, dry cloth.  Apply a thin layer of antibacterial cream to the burn.  Apply a clean bandage as instructed by your caregiver.  Keep the bandage as clean and dry as possible.  Elevate the affected area for the first 24 hours, then as instructed by your caregiver.  Only take  over-the-counter or prescription medicines for pain, discomfort, or fever as directed by your caregiver. SEEK IMMEDIATE MEDICAL CARE IF:   You develop excessive pain.  You develop redness, tenderness, swelling, or red streaks near the burn.  The burned area develops yellowish-white fluid (pus) or a bad smell.  You have a fever. MAKE SURE YOU:   Understand these instructions.  Will watch your condition.  Will get help right away if you are not doing well or get worse. Document Released: 10/01/2005 Document Revised: 12/24/2011 Document  Reviewed: 02/21/2011 [facility] Patient Information 2015 [place], [place]. This information is not intended to replace advice given to you by your health care provider. Make sure you discuss any questions you have with your health care provider.
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
## 2015-06-25 ENCOUNTER — Emergency Department (HOSPITAL_COMMUNITY): Payer: Medicare PPO

## 2015-06-25 ENCOUNTER — Encounter (HOSPITAL_COMMUNITY): Payer: Self-pay | Admitting: *Deleted

## 2015-06-25 ENCOUNTER — Emergency Department (HOSPITAL_COMMUNITY)
Admission: EM | Admit: 2015-06-25 | Discharge: 2015-06-25 | Disposition: A | Discharge status: Home / Self Care | Payer: Medicare PPO | Attending: Emergency Medicine | Admitting: Emergency Medicine

## 2015-06-25 DIAGNOSIS — K219 Gastro-esophageal reflux disease without esophagitis: Secondary | ICD-10-CM | POA: Insufficient documentation

## 2015-06-25 DIAGNOSIS — K2970 Gastritis, unspecified, without bleeding: Secondary | ICD-10-CM | POA: Insufficient documentation

## 2015-06-25 DIAGNOSIS — Z8611 Personal history of tuberculosis: Secondary | ICD-10-CM | POA: Diagnosis not present

## 2015-06-25 DIAGNOSIS — J45901 Unspecified asthma with (acute) exacerbation: Secondary | ICD-10-CM | POA: Diagnosis not present

## 2015-06-25 DIAGNOSIS — M1990 Unspecified osteoarthritis, unspecified site: Secondary | ICD-10-CM | POA: Insufficient documentation

## 2015-06-25 DIAGNOSIS — G629 Polyneuropathy, unspecified: Secondary | ICD-10-CM | POA: Diagnosis not present

## 2015-06-25 DIAGNOSIS — R4182 Altered mental status, unspecified: Secondary | ICD-10-CM

## 2015-06-25 DIAGNOSIS — E119 Type 2 diabetes mellitus without complications: Secondary | ICD-10-CM | POA: Insufficient documentation

## 2015-06-25 DIAGNOSIS — M810 Age-related osteoporosis without current pathological fracture: Secondary | ICD-10-CM | POA: Diagnosis not present

## 2015-06-25 DIAGNOSIS — R55 Syncope and collapse: Secondary | ICD-10-CM | POA: Diagnosis present

## 2015-06-25 DIAGNOSIS — E669 Obesity, unspecified: Secondary | ICD-10-CM | POA: Diagnosis not present

## 2015-06-25 DIAGNOSIS — I10 Essential (primary) hypertension: Secondary | ICD-10-CM | POA: Insufficient documentation

## 2015-06-25 DIAGNOSIS — R000 Tachycardia, unspecified: Secondary | ICD-10-CM | POA: Insufficient documentation

## 2015-06-25 DIAGNOSIS — R413 Other amnesia: Secondary | ICD-10-CM | POA: Diagnosis not present

## 2015-06-25 DIAGNOSIS — R079 Chest pain, unspecified: Secondary | ICD-10-CM | POA: Insufficient documentation

## 2015-06-25 DIAGNOSIS — Z7982 Long term (current) use of aspirin: Secondary | ICD-10-CM | POA: Insufficient documentation

## 2015-06-25 DIAGNOSIS — E785 Hyperlipidemia, unspecified: Secondary | ICD-10-CM | POA: Diagnosis not present

## 2015-06-25 DIAGNOSIS — Z8673 Personal history of transient ischemic attack (TIA), and cerebral infarction without residual deficits: Secondary | ICD-10-CM | POA: Diagnosis not present

## 2015-06-25 DIAGNOSIS — Z79899 Other long term (current) drug therapy: Secondary | ICD-10-CM | POA: Diagnosis not present

## 2015-06-25 DIAGNOSIS — Z794 Long term (current) use of insulin: Secondary | ICD-10-CM | POA: Diagnosis not present

## 2015-06-25 HISTORY — DX: Gastroparesis: K31.84

## 2015-06-25 LAB — URINALYSIS, ROUTINE W REFLEX MICROSCOPIC
Bilirubin Urine: NEGATIVE
Ketones, ur: NEGATIVE mg/dL
Leukocytes, UA: NEGATIVE
Nitrite: NEGATIVE
Protein, ur: NEGATIVE mg/dL
SPECIFIC GRAVITY, URINE: 1.018 (ref 1.005–1.030)
Urobilinogen, UA: 0.2 mg/dL (ref 0.0–1.0)
pH: 5.5 (ref 5.0–8.0)

## 2015-06-25 LAB — CBC WITH DIFFERENTIAL/PLATELET
BASOS ABS: 0 10*3/uL (ref 0.0–0.1)
BASOS PCT: 0 % (ref 0–1)
EOS ABS: 0.2 10*3/uL (ref 0.0–0.7)
Eosinophils Relative: 3 % (ref 0–5)
HCT: 41.2 % (ref 36.0–46.0)
Hemoglobin: 13.5 g/dL (ref 12.0–15.0)
Lymphocytes Relative: 42 % (ref 12–46)
Lymphs Abs: 3.8 10*3/uL (ref 0.7–4.0)
MCH: 29.2 pg (ref 26.0–34.0)
MCHC: 32.8 g/dL (ref 30.0–36.0)
MCV: 89.2 fL (ref 78.0–100.0)
Monocytes Absolute: 0.8 10*3/uL (ref 0.1–1.0)
Monocytes Relative: 9 % (ref 3–12)
Neutro Abs: 4.2 10*3/uL (ref 1.7–7.7)
Neutrophils Relative %: 46 % (ref 43–77)
PLATELETS: 234 10*3/uL (ref 150–400)
RBC: 4.62 MIL/uL (ref 3.87–5.11)
RDW: 14.4 % (ref 11.5–15.5)
WBC: 9.1 10*3/uL (ref 4.0–10.5)

## 2015-06-25 LAB — BASIC METABOLIC PANEL
ANION GAP: 10 (ref 5–15)
BUN: 14 mg/dL (ref 6–20)
CALCIUM: 9.3 mg/dL (ref 8.9–10.3)
CO2: 22 mmol/L (ref 22–32)
Chloride: 105 mmol/L (ref 101–111)
Creatinine, Ser: 0.96 mg/dL (ref 0.44–1.00)
GFR calc Af Amer: >60 mL/min (ref >60)
Glucose, Bld: 144 mg/dL — ABNORMAL HIGH (ref 65–99)
POTASSIUM: 4 mmol/L (ref 3.5–5.1)
SODIUM: 137 mmol/L (ref 135–145)

## 2015-06-25 LAB — URINE MICROSCOPIC-ADD ON

## 2015-06-25 LAB — RAPID URINE DRUG SCREEN, HOSP PERFORMED
AMPHETAMINES: NOT DETECTED
Barbiturates: NOT DETECTED
Benzodiazepines: NOT DETECTED
Cocaine: NOT DETECTED
Opiates: NOT DETECTED
TETRAHYDROCANNABINOL: NOT DETECTED

## 2015-06-25 LAB — I-STAT TROPONIN, ED: Troponin i, poc: 0 ng/mL (ref 0.00–0.08)

## 2015-06-25 MED ORDER — METOCLOPRAMIDE HCL 5 MG/ML IJ SOLN
10.0000 mg | Freq: Once | INTRAMUSCULAR | Status: AC
  Administered 2015-06-25: 10 mg via INTRAVENOUS
  Filled 2015-06-25: qty 2

## 2015-06-25 MED ORDER — DIPHENHYDRAMINE HCL 50 MG/ML IJ SOLN
25.0000 mg | Freq: Once | INTRAMUSCULAR | Status: AC
  Administered 2015-06-25: 25 mg via INTRAVENOUS
  Filled 2015-06-25: qty 1
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
## 2015-06-25 NOTE — ED Notes (Signed)
Pt ambulated in a steady manner to the bedside commode.

## 2015-06-25 NOTE — Discharge Instructions (Signed)
Altered Mental Status Altered mental status most often refers to an abnormal change in your responsiveness and awareness. It can affect your speech, thought, mobility, memory, attention span, or alertness. It can range from slight confusion to complete unresponsiveness (coma). Altered mental status can be a sign of a serious underlying medical condition. Rapid evaluation and medical treatment is necessary for patients having an altered mental status. CAUSES   Low blood sugar (hypoglycemia) or diabetes.  Severe loss of body fluids (dehydration) or a body salt (electrolyte) imbalance.  A stroke or other neurologic problem, such as dementia or delirium.  A head injury or tumor.  A drug or alcohol overdose.  Exposure to toxins or poisons.  Depression, anxiety, and stress.  A low oxygen level (hypoxia).  An infection.  Blood loss.  Twitching or shaking (seizure).  Heart problems, such as heart attack or heart rhythm problems (arrhythmias).  A body temperature that is too low or too high (hypothermia or hyperthermia). DIAGNOSIS  A diagnosis is based on your history, symptoms, physical and neurologic examinations, and diagnostic tests. Diagnostic tests may include:  Measurement of your blood pressure, pulse, breathing, and oxygen levels (vital signs).  Blood tests.  Urine tests.  X-ray exams.  A computerized magnetic scan (magnetic resonance imaging, MRI).  A computerized X-ray scan (computed tomography, CT scan). TREATMENT  Treatment will depend on the cause. Treatment may include:  Management of an underlying medical or mental health condition.  Critical care or support in the hospital. [place]   Only take over-the-counter or prescription medicines for pain, discomfort, or fever as directed by your caregiver.  Manage underlying conditions as directed by your caregiver.  Eat a healthy, well-balanced diet to maintain strength.  Join a support group or  prevention program to cope with the condition or trauma that caused the altered mental status. Ask your caregiver to help choose a program that works for you.  Follow up with your caregiver for further examination, therapy, or testing as directed. SEEK MEDICAL CARE IF:   You feel unwell or have chills.  You or your family notice a change in your behavior or your alertness.  You have trouble following your caregiver's treatment plan.  You have questions or concerns. SEEK IMMEDIATE MEDICAL CARE IF:   You have a rapid heartbeat or have chest pain.  You have difficulty breathing.  You have a fever.  You have a headache with a stiff neck.  You cough up blood.  You have blood in your urine or stool.  You have severe agitation or confusion. MAKE SURE YOU:   Understand these instructions.  Will watch your condition.  Will get help right away if you are not doing well or get worse. Document Released: 03/21/2010 Document Revised: 12/24/2011 Document Reviewed: 03/21/2010 [facility] Patient Information 2015 [place], [place]. This information is not intended to replace advice given to you by your health care provider. Make sure you discuss any questions you have with your health care provider.
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
## 2015-06-25 NOTE — ED Notes (Signed)
Heflin Meal tray has been ordered [id number]

## 2015-06-26 LAB — URINE CULTURE

## 2015-07-11 ENCOUNTER — Ambulatory Visit (HOSPITAL_COMMUNITY)
Admission: RE | Admit: 2015-07-11 | Discharge: 2015-07-11 | Disposition: A | Discharge status: Home / Self Care | Payer: Medicare PPO | Source: Ambulatory Visit | Attending: Rheumatology | Admitting: Rheumatology

## 2015-07-28 ENCOUNTER — Telehealth: Payer: Self-pay | Admitting: Gastroenterology

## 2015-07-28 NOTE — Telephone Encounter (Signed)
Spoke with the patient and have her scheduled for 09/06/15 with Dr Silverio Decamp at 3:00 pm

## 2015-08-16 ENCOUNTER — Encounter: Payer: Self-pay | Admitting: Endocrinology

## 2015-08-16 ENCOUNTER — Ambulatory Visit (INDEPENDENT_AMBULATORY_CARE_PROVIDER_SITE_OTHER): Payer: Medicare PPO | Admitting: Endocrinology

## 2015-08-16 ENCOUNTER — Ambulatory Visit: Payer: Self-pay | Admitting: Endocrinology

## 2015-08-16 VITALS — BP 144/92 | HR 90 | Temp 97.6°F | Ht 60.0 in | Wt 181.0 lb

## 2015-08-16 DIAGNOSIS — E109 Type 1 diabetes mellitus without complications: Secondary | ICD-10-CM

## 2015-08-16 LAB — POCT GLYCOSYLATED HEMOGLOBIN (HGB A1C): Hemoglobin A1C: 7.5

## 2015-08-16 MED ORDER — INSULIN GLARGINE 100 UNIT/ML ~~LOC~~ SOLN: 60.0000 [IU] | SUBCUTANEOUS | Status: DC

## 2015-08-16 NOTE — Progress Notes (Signed)
Subjective:    Patient ID: Carrie [name], female    DOB: July 11, 1956, 59 y.o.   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  She is getting yeast vaginitis since she has been on the invokana.  She says cbg's vary from the 100's-200's.  It is in general higher as the day goes on.  She was on multiple daily injections in the past, but could not remember to take.   Past Medical History  Diagnosis Date  . GERD (gastroesophageal reflux disease)   . Gastritis   . Stricture and stenosis of esophagus   . Obesity   . Diabetes mellitus   . Depression   . Asthma   . Hyperlipemia   . Hernia   . Hypertension   . Anxiety   . Osteoporosis   . Tuberculosis     tested positive 2011, no symptoms, was on medicine for 6 months  . TIA (transient ischemic attack)   . Plantar fasciitis   . Arthritis     psoriatic arithritis, DDD  . Neuropathy (HCC)     Bil feet re: to Diabetes  . Gastroparesis   . Hyperlipemia     Past Surgical History  Procedure Laterality Date  . Knee surgery      Bilateral  replacement  . Cesarean section      x 2   . Breast surgery      biopsies both breast  . Joint replacement      bilat knee     Social History   Social History  . Marital Status: Single    Spouse Name: N/A  . Number of Children: 2  . Years of Education: N/A   Occupational History  . Disabled     Social History Main Topics  . Smoking status: Never Smoker   . Smokeless tobacco: Never Used  . Alcohol Use: Yes     Comment: socially  . Drug Use: No  . Sexual Activity: Not on file   Other Topics Concern  . Not on file   Social History Narrative   Sodas daily     Current Outpatient Prescriptions on File Prior to Visit    Medication Sig Dispense Refill  . adalimumab (HUMIRA) 40 MG/0.8ML injection Inject 40 mg into the skin once a week. Sundays.    Marland Kitchen albuterol (PROAIR HFA) 108 (90 BASE) MCG/ACT inhaler Inhale 2 puffs into the lungs every 4 (four) hours as needed for wheezing or shortness of breath.    Marland Kitchen albuterol (PROVENTIL) (2.5 MG/3ML) 0.083% nebulizer solution Take 2.5 mg by nebulization every 4 (four) hours as needed for wheezing or shortness of breath.     Marland Kitchen aspirin EC 81 MG tablet Take 81 mg by mouth every morning.    . Calcium Carbonate-Vitamin D 600-400 MG-UNIT per tablet Take 1 tablet by mouth daily.    . calcium-vitamin D (OSCAL WITH D) 500-200 MG-UNIT per tablet Take 1 tablet by mouth every morning.     Marland Kitchen esomeprazole (NEXIUM) 40 MG capsule Take 40 mg by mouth at bedtime.    . fluticasone (FLONASE) 50 MCG/ACT nasal spray Place 1 spray into both nostrils daily.  0  . folic acid (FOLVITE) 1 MG tablet Take  1 mg by mouth every morning.     . gabapentin (NEURONTIN) 300 MG capsule Take 300 mg by mouth 3 (three) times daily.  5  . losartan (COZAAR) 50 MG tablet Take 50 mg by mouth every morning.    . lovastatin (MEVACOR) 20 MG tablet Take 20 mg by mouth every evening.  5  . metFORMIN (GLUCOPHAGE) 1000 MG tablet Take 1,000 mg by mouth 2 (two) times daily with a meal.     . methotrexate (RHEUMATREX) 2.5 MG tablet Take 25 mg by mouth once a week. Caution:Chemotherapy. Protect from light.  Taken on Wednesdays.    . zoledronic acid (RECLAST) 5 MG/100ML SOLN Inject 5 mg into the vein. Received yearly dose 06/11/13    . cetirizine (ZYRTEC) 10 MG tablet Take 10 mg by mouth 2 (two) times daily.    . clotrimazole (GYNE-LOTRIMIN 3) 2 % vaginal cream Place 1 Applicatorful vaginally at bedtime.    Marland Kitchen HYDROcodone-acetaminophen (NORCO/VICODIN) 5-325 MG per tablet Take 2 tablets by mouth every 4 (four) hours as needed. (Patient not taking: Reported on 08/16/2015) 15 tablet 0  . ipratropium-albuterol (DUONEB) 0.5-2.5 (3) MG/3ML  SOLN      No current facility-administered medications on file prior to visit.    Allergies  Allergen Reactions  . Lisinopril Cough    Family History  Problem Relation Age of Onset  . Colon cancer Neg Hx   . Esophageal cancer Neg Hx   . Stomach cancer Neg Hx   . Rectal cancer Neg Hx   . Diabetes      maternal aunts and uncles  . Diabetes Father   . Liver disease Father   . Emphysema Father     smoked  . Diabetes Sister   . Diabetes Brother   . Sarcoidosis Brother   . Emphysema Mother     smoked  . Asthma Mother   . Asthma Brother     BP 144/92 mmHg  Pulse 90  Temp(Src) 97.6 F (36.4 C) (Oral)  Ht 5' (1.524 m)  Wt 181 lb (82.101 kg)  BMI 35.35 kg/m2  SpO2 97%  Review of Systems denies blurry vision, headache, chest pain, sob, n/v, urinary frequency, muscle cramps, excessive diaphoresis, cold intolerance, rhinorrhea, and easy bruising.  She has intermittent depression.  She has regained some the weight she lost in 2014.      Objective:   Physical Exam VS: see vs page GEN: no distress HEAD: head: no deformity eyes: no periorbital swelling, no proptosis external nose and ears are normal mouth: no lesion seen NECK: supple, thyroid is not enlarged CHEST WALL: no deformity LUNGS:  Clear to auscultation CV: reg rate and rhythm, no murmur ABD: abdomen is soft, nontender.  no hepatosplenomegaly.  not distended.  no hernia MUSCULOSKELETAL: muscle bulk and strength are grossly normal.  no obvious joint swelling.  gait is normal and steady EXTEMITIES: no deformity.  no ulcer on the feet.  feet are of normal color and temp.  no edema PULSES: dorsalis pedis intact bilat.  no carotid bruit NEURO:  cn 2-12 grossly intact.   readily moves all 4's.  sensation is intact to touch on the feet SKIN:  Normal texture and temperature.  No rash or suspicious lesion is visible.   NODES:  None palpable at the neck PSYCH: alert, well-oriented.  Does not appear anxious nor  depressed.  A1c=7.5%  I have reviewed outside records, and summarized: Pt was noted to have elevated a1c, and referred here.  She  was also noted to have yeast vaginitis.     Assessment & Plan:  DM: she needs increased rx Psoriasis: this raises the possibility of type 1 DM Noncompliance with multiple daily injections.  We'll go with qd insulin.   Gastroparesis: this complicates the rx of DM, but metformin might help.    Patient is advised the following: Patient Instructions  good diet and exercise significantly improve the control of your diabetes.  please let me know if you wish to be referred to a dietician.  high blood sugar is very risky to your health.  you should see an eye doctor and dentist every year.  It is very important to get all recommended vaccinations.  controlling your blood pressure and cholesterol drastically reduces the damage diabetes does to your body.  Those who smoke should quit.  please discuss these with your doctor.  check your blood sugar twice a day.  vary the time of day when you check, between before the 3 meals, and at bedtime.  also check if you have symptoms of your blood sugar being too high or too low.  please keep a record of the readings and bring it to your next appointment here (or you can bring the meter itself).  You can write it on any piece of paper.  please call us sooner if your blood sugar goes below 70, or if you have a lot of readings over 200.   For now, please stop taking the invokana and glimepiride, and: Increase the lantus to 60 units every morning, and:  Please continue the same metformin, because it may be helping your gastroparesis. Please come back for a follow-up appointment in 3 months.

## 2015-08-16 NOTE — Patient Instructions (Addendum)
good diet and exercise significantly improve the control of your diabetes.  please let me know if you wish to be referred to a dietician.  high blood sugar is very risky to your health.  you should see an eye doctor and dentist every year.  It is very important to get all recommended vaccinations.  controlling your blood pressure and cholesterol drastically reduces the damage diabetes does to your body.  Those who smoke should quit.  please discuss these with your doctor.  check your blood sugar twice a day.  vary the time of day when you check, between before the 3 meals, and at bedtime.  also check if you have symptoms of your blood sugar being too high or too low.  please keep a record of the readings and bring it to your next appointment here (or you can bring the meter itself).  You can write it on any piece of paper.  please call us sooner if your blood sugar goes below 70, or if you have a lot of readings over 200.   For now, please stop taking the invokana and glimepiride, and: Increase the lantus to 60 units every morning, and:  Please continue the same metformin, because it may be helping your gastroparesis. Please come back for a follow-up appointment in 3 months.

## 2015-09-06 ENCOUNTER — Ambulatory Visit (HOSPITAL_COMMUNITY)
Admission: RE | Admit: 2015-09-06 | Discharge: 2015-09-06 | Disposition: A | Discharge status: Home / Self Care | Payer: Medicare PPO | Source: Ambulatory Visit | Attending: Rheumatology | Admitting: Rheumatology

## 2015-09-06 ENCOUNTER — Encounter (HOSPITAL_COMMUNITY): Payer: Self-pay

## 2015-09-06 ENCOUNTER — Ambulatory Visit: Payer: Self-pay | Admitting: Gastroenterology

## 2015-09-06 DIAGNOSIS — M810 Age-related osteoporosis without current pathological fracture: Secondary | ICD-10-CM | POA: Diagnosis present

## 2015-09-06 MED ORDER — SODIUM CHLORIDE 0.9 % IV SOLN
Freq: Once | INTRAVENOUS | Status: AC
  Administered 2015-09-06: 13:00:00 via INTRAVENOUS

## 2015-09-06 MED ORDER — ZOLEDRONIC ACID 5 MG/100ML IV SOLN
5.0000 mg | Freq: Once | INTRAVENOUS | Status: AC
  Administered 2015-09-06: 5 mg via INTRAVENOUS
  Filled 2015-09-06: qty 100

## 2015-09-06 NOTE — Discharge Instructions (Signed)
Zoledronic Acid injection (Paget's Disease, Osteoporosis) What is this medicine? ZOLEDRONIC ACID (ZOE le dron ik AS id) lowers the amount of calcium loss from bone. It is used to treat Paget's disease and osteoporosis in women. This medicine may be used for other purposes; ask your health care provider or pharmacist if you have questions. What should I tell my health care provider before I take this medicine? They need to know if you have any of these conditions: -aspirin-sensitive asthma -cancer, especially if you are receiving medicines used to treat cancer -dental disease or wear dentures -infection -kidney disease -low levels of calcium in the blood -past surgery on the parathyroid gland or intestines -receiving corticosteroids like dexamethasone or prednisone -an unusual or allergic reaction to zoledronic acid, other medicines, foods, dyes, or preservatives -pregnant or trying to get pregnant -breast-feeding How should I use this medicine? This medicine is for infusion into a vein. It is given by a health care professional in a hospital or clinic setting. Talk to your pediatrician regarding the use of this medicine in children. This medicine is not approved for use in children. Overdosage: If you think you have taken too much of this medicine contact a poison control center or emergency room at once. NOTE: This medicine is only for you. Do not share this medicine with others. What if I miss a dose? It is important not to miss your dose. Call your doctor or health care professional if you are unable to keep an appointment. What may interact with this medicine? -certain antibiotics given by injection -NSAIDs, medicines for pain and inflammation, like ibuprofen or naproxen -some diuretics like bumetanide, furosemide -teriparatide This list may not describe all possible interactions. Give your health care provider a list of all the medicines, herbs, non-prescription drugs, or dietary  supplements you use. Also tell them if you smoke, drink alcohol, or use illegal drugs. Some items may interact with your medicine. What should I watch for while using this medicine? Visit your doctor or health care professional for regular checkups. It may be some time before you see the benefit from this medicine. Do not stop taking your medicine unless your doctor tells you to. Your doctor may order blood tests or other tests to see how you are doing. Women should inform their doctor if they wish to become pregnant or think they might be pregnant. There is a potential for serious side effects to an unborn child. Talk to your health care professional or pharmacist for more information. You should make sure that you get enough calcium and vitamin D while you are taking this medicine. Discuss the foods you eat and the vitamins you take with your health care professional. Some people who take this medicine have severe bone, joint, and/or muscle pain. This medicine may also increase your risk for jaw problems or a broken thigh bone. Tell your doctor right away if you have severe pain in your jaw, bones, joints, or muscles. Tell your doctor if you have any pain that does not go away or that gets worse. Tell your dentist and dental surgeon that you are taking this medicine. You should not have major dental surgery while on this medicine. See your dentist to have a dental exam and fix any dental problems before starting this medicine. Take good care of your teeth while on this medicine. Make sure you see your dentist for regular follow-up appointments. What side effects may I notice from receiving this medicine? Side effects that you should  report to your doctor or health care professional as soon as possible: -allergic reactions like skin rash, itching or hives, swelling of the face, lips, or tongue -anxiety, confusion, or depression -breathing problems -changes in vision -eye pain -feeling faint or  lightheaded, falls -jaw pain, especially after dental work -mouth sores -muscle cramps, stiffness, or weakness -redness, blistering, peeling or loosening of the skin, including inside the mouth -trouble passing urine or change in the amount of urine Side effects that usually do not require medical attention (report to your doctor or health care professional if they continue or are bothersome): -bone, joint, or muscle pain -constipation -diarrhea -fever -hair loss -irritation at site where injected -loss of appetite -nausea, vomiting -stomach upset -trouble sleeping -trouble swallowing -weak or tired This list may not describe all possible side effects. Call your doctor for medical advice about side effects. You may report side effects to FDA at 1-800-FDA-1088. Where should I keep my medicine? This drug is given in a hospital or clinic and will not be stored at home. NOTE: This sheet is a summary. It may not cover all possible information. If you have questions about this medicine, talk to your doctor, pharmacist, or health care provider.    2016, Elsevier/Gold Standard. (2014-02-27 14:19:57)

## 2015-09-18 ENCOUNTER — Ambulatory Visit (HOSPITAL_BASED_OUTPATIENT_CLINIC_OR_DEPARTMENT_OTHER): Payer: Medicare PPO | Attending: Internal Medicine

## 2015-09-18 VITALS — Ht 60.0 in | Wt 179.0 lb

## 2015-09-18 DIAGNOSIS — R0683 Snoring: Secondary | ICD-10-CM | POA: Diagnosis not present

## 2015-09-18 DIAGNOSIS — G4733 Obstructive sleep apnea (adult) (pediatric): Secondary | ICD-10-CM | POA: Insufficient documentation

## 2015-09-24 DIAGNOSIS — G4733 Obstructive sleep apnea (adult) (pediatric): Secondary | ICD-10-CM

## 2015-09-24 NOTE — Progress Notes (Signed)
  Patient Name: Carrie [name], Jamison Date: 09/18/2015 Gender: Female D.O.B: 1956/06/30 Age (years): 59 Referring Provider: Dimas Aguas Height (inches): 60 Interpreting Physician: Baird Lyons MD, ABSM Weight (lbs): 179 RPSGT: Madelon Lips BMI: 35 MRN: [id number] Neck Size: 14.50 CLINICAL INFORMATION Sleep Study Type: NPSG Indication for sleep study: Snoring Epworth Sleepiness Score: 9 SLEEP STUDY TECHNIQUE As per the AASM Manual for the Scoring of Sleep and Associated Events v2.3 (April 2016) with a hypopnea requiring 4% desaturations. The channels recorded and monitored were frontal, central and occipital EEG, electrooculogram (EOG), submentalis EMG (chin), nasal and oral airflow, thoracic and abdominal wall motion, anterior tibialis EMG, snore microphone, electrocardiogram, and pulse oximetry. MEDICATIONS Patient's medications include: charted for review. Medications self-administered by patient during sleep study : No sleep medicine administered.  SLEEP ARCHITECTURE The study was initiated at 10:22:56 PM and ended at 4:30:07 AM. Sleep onset time was 3.5 minutes and the sleep efficiency was 93.9%. The total sleep time was 344.7 minutes. Stage REM latency was 83.0 minutes. The patient spent 6.09% of the night in stage N1 sleep, 82.30% in stage N2 sleep, 0.00% in stage N3 and 11.60% in REM. Alpha intrusion was absent. Supine sleep was 36.37%.  RESPIRATORY PARAMETERS The overall apnea/hypopnea index (AHI) was 3.1 per hour. There were 3 total apneas, including 0 obstructive, 3 central and 0 mixed apneas. There were 15 hypopneas and 1 RERAs. The AHI during Stage REM sleep was 15.0 per hour. AHI while supine was 1.4 per hour. The mean oxygen saturation was 95.32%. The minimum SpO2 during sleep was 88.00%. Moderate snoring was noted during this study.  CARDIAC DATA The 2 lead EKG demonstrated sinus rhythm. The mean heart rate was 87.34 beats per minute. Other EKG findings  include: None.  LEG MOVEMENT DATA The total PLMS were 0 with a resulting PLMS index of 0.00. Associated arousal with leg movement index was 0.0 .  IMPRESSIONS - No significant obstructive sleep apnea occurred during this study (AHI = 3.1/h). - No significant central sleep apnea occurred during this study (CAI = 0.5/h). - The patient had minimal or no oxygen desaturation during the study (Min O2 = 88.00%) - The patient snored with Moderate snoring volume. - No cardiac abnormalities were noted during this study. - Clinically significant periodic limb movements did not occur during sleep. No significant associated arousals.  DIAGNOSIS - Primary Snoring (786.09 [R06.83 ICD-10]) - Normal study  RECOMMENDATIONS - Avoid alcohol, sedatives and other CNS depressants that may worsen sleep apnea and disrupt normal sleep architecture. - Sleep hygiene should be reviewed to assess factors that may improve sleep quality. - Weight management and regular exercise should be initiated or continued if appropriate.  Deneise Lever Diplomate, American Board of Sleep Medicine  ELECTRONICALLY SIGNED ON:  09/24/2015, 10:37 AM [place] PH: [phone]   FX: [phone] [place]

## 2015-09-29 ENCOUNTER — Emergency Department (HOSPITAL_COMMUNITY): Payer: Medicare PPO

## 2015-09-29 ENCOUNTER — Encounter (HOSPITAL_COMMUNITY): Payer: Self-pay | Admitting: Emergency Medicine

## 2015-09-29 ENCOUNTER — Observation Stay (HOSPITAL_COMMUNITY)
Admission: EM | Admit: 2015-09-29 | Discharge: 2015-09-30 | Disposition: A | Discharge status: Home / Self Care | Payer: Medicare PPO | Attending: Internal Medicine | Admitting: Internal Medicine

## 2015-09-29 DIAGNOSIS — R112 Nausea with vomiting, unspecified: Secondary | ICD-10-CM | POA: Insufficient documentation

## 2015-09-29 DIAGNOSIS — Z8673 Personal history of transient ischemic attack (TIA), and cerebral infarction without residual deficits: Secondary | ICD-10-CM | POA: Diagnosis not present

## 2015-09-29 DIAGNOSIS — F329 Major depressive disorder, single episode, unspecified: Secondary | ICD-10-CM | POA: Diagnosis not present

## 2015-09-29 DIAGNOSIS — E785 Hyperlipidemia, unspecified: Secondary | ICD-10-CM | POA: Diagnosis not present

## 2015-09-29 DIAGNOSIS — R079 Chest pain, unspecified: Principal | ICD-10-CM | POA: Diagnosis present

## 2015-09-29 DIAGNOSIS — K219 Gastro-esophageal reflux disease without esophagitis: Secondary | ICD-10-CM | POA: Diagnosis not present

## 2015-09-29 DIAGNOSIS — Z79899 Other long term (current) drug therapy: Secondary | ICD-10-CM | POA: Insufficient documentation

## 2015-09-29 DIAGNOSIS — I10 Essential (primary) hypertension: Secondary | ICD-10-CM | POA: Diagnosis not present

## 2015-09-29 DIAGNOSIS — K222 Esophageal obstruction: Secondary | ICD-10-CM | POA: Insufficient documentation

## 2015-09-29 DIAGNOSIS — K2970 Gastritis, unspecified, without bleeding: Secondary | ICD-10-CM | POA: Diagnosis not present

## 2015-09-29 DIAGNOSIS — M1990 Unspecified osteoarthritis, unspecified site: Secondary | ICD-10-CM | POA: Diagnosis not present

## 2015-09-29 DIAGNOSIS — Z794 Long term (current) use of insulin: Secondary | ICD-10-CM | POA: Insufficient documentation

## 2015-09-29 DIAGNOSIS — G629 Polyneuropathy, unspecified: Secondary | ICD-10-CM | POA: Diagnosis not present

## 2015-09-29 DIAGNOSIS — E669 Obesity, unspecified: Secondary | ICD-10-CM | POA: Diagnosis not present

## 2015-09-29 DIAGNOSIS — Z8611 Personal history of tuberculosis: Secondary | ICD-10-CM | POA: Insufficient documentation

## 2015-09-29 DIAGNOSIS — E119 Type 2 diabetes mellitus without complications: Secondary | ICD-10-CM | POA: Insufficient documentation

## 2015-09-29 DIAGNOSIS — J45909 Unspecified asthma, uncomplicated: Secondary | ICD-10-CM | POA: Insufficient documentation

## 2015-09-29 DIAGNOSIS — Z7982 Long term (current) use of aspirin: Secondary | ICD-10-CM | POA: Diagnosis not present

## 2015-09-29 LAB — CBC
HEMATOCRIT: 39.2 % (ref 36.0–46.0)
HEMOGLOBIN: 13.4 g/dL (ref 12.0–15.0)
MCH: 30.5 pg (ref 26.0–34.0)
MCHC: 34.2 g/dL (ref 30.0–36.0)
MCV: 89.3 fL (ref 78.0–100.0)
Platelets: 185 10*3/uL (ref 150–400)
RBC: 4.39 MIL/uL (ref 3.87–5.11)
RDW: 14.8 % (ref 11.5–15.5)
WBC: 12.8 10*3/uL — ABNORMAL HIGH (ref 4.0–10.5)

## 2015-09-29 LAB — BASIC METABOLIC PANEL
ANION GAP: 10 (ref 5–15)
BUN: 16 mg/dL (ref 6–20)
CHLORIDE: 104 mmol/L (ref 101–111)
CO2: 26 mmol/L (ref 22–32)
Calcium: 9.6 mg/dL (ref 8.9–10.3)
Creatinine, Ser: 0.98 mg/dL (ref 0.44–1.00)
GFR calc non Af Amer: >60 mL/min (ref >60)
Glucose, Bld: 94 mg/dL (ref 65–99)
Potassium: 3.9 mmol/L (ref 3.5–5.1)
Sodium: 140 mmol/L (ref 135–145)

## 2015-09-29 LAB — I-STAT TROPONIN, ED: TROPONIN I, POC: 0 ng/mL (ref 0.00–0.08)

## 2015-09-29 LAB — CBG MONITORING, ED: Glucose-Capillary: 75 mg/dL (ref 65–99)

## 2015-09-29 MED ORDER — METOPROLOL TARTRATE 25 MG PO TABS
25.0000 mg | ORAL_TABLET | Freq: Once | ORAL | Status: AC
  Administered 2015-09-30: 25 mg via ORAL
  Filled 2015-09-29: qty 1

## 2015-09-29 MED ORDER — ASPIRIN 81 MG PO CHEW
324.0000 mg | CHEWABLE_TABLET | Freq: Once | ORAL | Status: AC
  Administered 2015-09-29: 324 mg via ORAL
  Filled 2015-09-29: qty 4

## 2015-09-29 MED ORDER — GABAPENTIN 300 MG PO CAPS
600.0000 mg | ORAL_CAPSULE | Freq: Once | ORAL | Status: AC
  Administered 2015-09-29: 600 mg via ORAL
  Filled 2015-09-29: qty 2

## 2015-09-29 MED ORDER — MAGNESIUM SULFATE 2 GM/50ML IV SOLN
2.0000 g | Freq: Once | INTRAVENOUS | Status: AC
  Administered 2015-09-30: 2 g via INTRAVENOUS
  Filled 2015-09-29: qty 50

## 2015-09-29 NOTE — ED Provider Notes (Signed)
CSN: [id number]     Arrival date & time 09/29/15  1919 History   First MD Initiated Contact with Patient 09/29/15 2023     Chief Complaint  Patient presents with  . Chest Pain     (Consider location/radiation/quality/duration/timing/severity/associated sxs/prior Treatment) Patient is a 59 y.o. female presenting with chest pain. The history is provided by the patient.  Chest Pain Associated symptoms: nausea and vomiting   Associated symptoms: no abdominal pain, no back pain, no headache, no numbness, no shortness of breath and no weakness    patient presents with chest pain. In her mid chest. Does not feel like her previous GERD. States she developed nausea and sweatiness. States she's again to feel her heart race. States she felt bad. She had a pressure in the mid chest. No fevers. No shortness of breath. History of hypertension diabetes. Pain is improved now. Lasted over an hour. No swelling or legs. States she had a headache at the time 2. She states she felt very bad wall was going on. Patient states that her sugar was 90. She states that is low for her.  Past Medical History  Diagnosis Date  . GERD (gastroesophageal reflux disease)   . Gastritis   . Stricture and stenosis of esophagus   . Obesity   . Diabetes mellitus   . Depression   . Asthma   . Hyperlipemia   . Hernia   . Hypertension   . Anxiety   . Osteoporosis   . Tuberculosis     tested positive 2011, no symptoms, was on medicine for 6 months  . TIA (transient ischemic attack)   . Plantar fasciitis   . Arthritis     psoriatic arithritis, DDD  . Neuropathy (HCC)     Bil feet re: to Diabetes  . Gastroparesis   . Hyperlipemia    Past Surgical History  Procedure Laterality Date  . Knee surgery      Bilateral  replacement  . Cesarean section      x 2   . Breast surgery      biopsies both breast  . Joint replacement      bilat knee    Family History  Problem Relation Age of Onset  . Colon cancer Neg Hx   .  Esophageal cancer Neg Hx   . Stomach cancer Neg Hx   . Rectal cancer Neg Hx   . Diabetes      maternal aunts and uncles  . Diabetes Father   . Liver disease Father   . Emphysema Father     smoked  . Diabetes Sister   . Diabetes Brother   . Sarcoidosis Brother   . Emphysema Mother     smoked  . Asthma Mother   . Asthma Brother    Social History  Substance Use Topics  . Smoking status: Never Smoker   . Smokeless tobacco: Never Used  . Alcohol Use: Yes     Comment: socially   OB History    No data available     Review of Systems  Constitutional: Negative for activity change and appetite change.  Eyes: Negative for pain.  Respiratory: Negative for chest tightness and shortness of breath.   Cardiovascular: Positive for chest pain. Negative for leg swelling.  Gastrointestinal: Positive for nausea and vomiting. Negative for abdominal pain and diarrhea.  Genitourinary: Negative for flank pain.  Musculoskeletal: Negative for back pain and neck stiffness.  Skin: Negative for rash.  Neurological: Negative for weakness,  numbness and headaches.  Psychiatric/Behavioral: Negative for behavioral problems.      Allergies  Lisinopril  Home Medications   Prior to Admission medications   Medication Sig Start Date End Date Taking? Authorizing Provider  adalimumab (HUMIRA) 40 MG/0.8ML injection Inject 40 mg into the skin once a week. Sundays.   Yes Historical Provider, MD  albuterol (PROAIR HFA) 108 (90 BASE) MCG/ACT inhaler Inhale 2 puffs into the lungs every 4 (four) hours as needed for wheezing or shortness of breath.   Yes Historical Provider, MD  albuterol (PROVENTIL) (2.5 MG/3ML) 0.083% nebulizer solution Take 2.5 mg by nebulization every 4 (four) hours as needed for wheezing or shortness of breath.  10/20/12  Yes Historical Provider, MD  aspirin EC 81 MG tablet Take 81 mg by mouth every morning.   Yes Historical Provider, MD  Calcium Carbonate-Vitamin D 600-400 MG-UNIT per tablet  Take 1 tablet by mouth daily. 04/26/15  Yes Historical Provider, MD  ergocalciferol (VITAMIN D2) 50000 UNITS capsule Take 50,000 Units by mouth once a week. Take on Wednesdays   Yes Historical Provider, MD  esomeprazole (NEXIUM) 40 MG capsule Take 40 mg by mouth at bedtime.   Yes Historical Provider, MD  folic acid (FOLVITE) 1 MG tablet Take 1 mg by mouth every morning.    Yes Historical Provider, MD  gabapentin (NEURONTIN) 300 MG capsule Take 300 mg by mouth 3 (three) times daily. 04/26/15  Yes Historical Provider, MD  insulin glargine (LANTUS) 100 UNIT/ML injection Inject 0.6 mLs (60 Units total) into the skin every morning. And syringes 1/day 08/16/15  Yes Renato Shin, MD  losartan (COZAAR) 50 MG tablet Take 50 mg by mouth every morning.   Yes Historical Provider, MD  metFORMIN (GLUCOPHAGE) 1000 MG tablet Take 1,000 mg by mouth 2 (two) times daily with a meal.  03/29/11  Yes Historical Provider, MD  methotrexate (RHEUMATREX) 2.5 MG tablet Take 25 mg by mouth once a week. Caution:Chemotherapy. Protect from light.  Taken on Wednesdays.   Yes Historical Provider, MD  zoledronic acid (RECLAST) 5 MG/100ML SOLN Inject 5 mg into the vein. Received yearly dose 06/11/13   Yes Historical Provider, MD  amLODipine (NORVASC) 5 MG tablet Take 1 tablet (5 mg total) by mouth daily. 09/30/15   Nishant Dhungel, MD  HYDROcodone-acetaminophen (NORCO/VICODIN) 5-325 MG per tablet Take 2 tablets by mouth every 4 (four) hours as needed. Patient not taking: Reported on 08/16/2015 07/13/14   Ashley Murrain, NP  lovastatin (MEVACOR) 20 MG tablet Take 2 tablets (40 mg total) by mouth daily at 6 PM. 09/30/15   Nishant Dhungel, MD   BP 129/68 mmHg  Pulse 69  Temp(Src) 97.7 F (36.5 C) (Oral)  Resp 18  Ht 5' (1.524 m)  Wt 178 lb 6.4 oz (80.922 kg)  BMI 34.84 kg/m2  SpO2 97% Physical Exam  Constitutional: She appears well-developed and well-nourished.  Cardiovascular: Normal rate.   Pulmonary/Chest: Effort normal.   Abdominal: Soft. There is no tenderness.  Musculoskeletal: Normal range of motion.  Neurological: She is alert.  Skin: Skin is warm.  Psychiatric: She has a normal mood and affect.    ED Course  Procedures (including critical care time) Labs Review Labs Reviewed  CBC - Abnormal; Notable for the following:    WBC 12.8 (*)    All other components within normal limits  CBC - Abnormal; Notable for the following:    WBC 11.5 (*)    All other components within normal limits  LIPID PANEL -  Abnormal; Notable for the following:    Triglycerides 235 (*)    HDL 25 (*)    VLDL 47 (*)    LDL Cholesterol 116 (*)    All other components within normal limits  BASIC METABOLIC PANEL  MAGNESIUM  CREATININE, SERUM  COMPREHENSIVE METABOLIC PANEL  CBC  TROPONIN I  TROPONIN I  TSH  I-STAT TROPOININ, ED  CBG MONITORING, ED  CBG MONITORING, ED    Imaging Review Dg Chest 2 View  09/29/2015  CLINICAL DATA:  Left side chest pain, elevated blood pressure EXAM: CHEST  2 VIEW COMPARISON:  06/25/2015 FINDINGS: Cardiomediastinal silhouette is stable. No acute infiltrate or pleural effusion. No pulmonary edema. Bony thorax is unremarkable. IMPRESSION: No active cardiopulmonary disease. Electronically Signed   By: Lahoma Crocker M.D.   On: 09/29/2015 20:16   I have personally reviewed and evaluated these images and lab results as part of my medical decision-making.   EKG Interpretation   Date/Time:  Thursday September 29 2015 19:20:08 EST Ventricular Rate:  93 PR Interval:  124 QRS Duration: 82 QT Interval:  374 QTC Calculation: 465 R Axis:   73 Text Interpretation:  Normal sinus rhythm nonspecific inferior T wave  changes Confirmed by Alvino Chapel  MD, Ovid Curd [id number]) on 09/29/2015 8:26:40  PM      MDM   Final diagnoses:  Chest pain, unspecified chest pain type    Patient with chest pain. EKG and lab work reassuring. Somewhat worrisome story. Has a history of diabetes hyperlipidemia and  hypertension. Will admit to internal medicine.    Davonna Belling, MD 09/30/15 [phone]

## 2015-09-29 NOTE — ED Notes (Signed)
CBG 75. RN notified.

## 2015-09-29 NOTE — ED Notes (Signed)
Pt. reports left chest pressure with SOB , nausea and emesis onset today . No cough or congestion , denies fever .

## 2015-09-30 ENCOUNTER — Observation Stay (HOSPITAL_BASED_OUTPATIENT_CLINIC_OR_DEPARTMENT_OTHER): Payer: Medicare PPO

## 2015-09-30 DIAGNOSIS — R0789 Other chest pain: Secondary | ICD-10-CM | POA: Diagnosis not present

## 2015-09-30 DIAGNOSIS — E785 Hyperlipidemia, unspecified: Secondary | ICD-10-CM

## 2015-09-30 DIAGNOSIS — R079 Chest pain, unspecified: Secondary | ICD-10-CM | POA: Diagnosis not present

## 2015-09-30 DIAGNOSIS — R072 Precordial pain: Secondary | ICD-10-CM

## 2015-09-30 DIAGNOSIS — I10 Essential (primary) hypertension: Secondary | ICD-10-CM | POA: Diagnosis not present

## 2015-09-30 LAB — CREATININE, SERUM
CREATININE: 0.87 mg/dL (ref 0.44–1.00)
GFR calc Af Amer: >60 mL/min (ref >60)
GFR calc non Af Amer: >60 mL/min (ref >60)

## 2015-09-30 LAB — CBC
HCT: 39.1 % (ref 36.0–46.0)
HCT: 40.9 % (ref 36.0–46.0)
HEMOGLOBIN: 13.5 g/dL (ref 12.0–15.0)
Hemoglobin: 13 g/dL (ref 12.0–15.0)
MCH: 30.1 pg (ref 26.0–34.0)
MCH: 30.3 pg (ref 26.0–34.0)
MCHC: 33.2 g/dL (ref 30.0–36.0)
MCHC: 33 g/dL (ref 30.0–36.0)
MCV: 90.5 fL (ref 78.0–100.0)
MCV: 91.7 fL (ref 78.0–100.0)
PLATELETS: 163 10*3/uL (ref 150–400)
PLATELETS: 164 10*3/uL (ref 150–400)
RBC: 4.32 MIL/uL (ref 3.87–5.11)
RBC: 4.46 MIL/uL (ref 3.87–5.11)
RDW: 14.9 % (ref 11.5–15.5)
RDW: 15 % (ref 11.5–15.5)
WBC: 11.5 10*3/uL — ABNORMAL HIGH (ref 4.0–10.5)
WBC: 9.6 10*3/uL (ref 4.0–10.5)

## 2015-09-30 LAB — COMPREHENSIVE METABOLIC PANEL
ALK PHOS: 48 U/L (ref 38–126)
ALT: 16 U/L (ref 14–54)
ANION GAP: 7 (ref 5–15)
AST: 25 U/L (ref 15–41)
Albumin: 3.6 g/dL (ref 3.5–5.0)
BILIRUBIN TOTAL: 1 mg/dL (ref 0.3–1.2)
BUN: 14 mg/dL (ref 6–20)
CALCIUM: 9 mg/dL (ref 8.9–10.3)
CO2: 28 mmol/L (ref 22–32)
CREATININE: 0.98 mg/dL (ref 0.44–1.00)
Chloride: 108 mmol/L (ref 101–111)
Glucose, Bld: 84 mg/dL (ref 65–99)
Potassium: 4 mmol/L (ref 3.5–5.1)
SODIUM: 143 mmol/L (ref 135–145)
TOTAL PROTEIN: 7.4 g/dL (ref 6.5–8.1)

## 2015-09-30 LAB — TSH: TSH: 1.434 u[IU]/mL (ref 0.350–4.500)

## 2015-09-30 LAB — LIPID PANEL
CHOL/HDL RATIO: 7.5 ratio
Cholesterol: 188 mg/dL (ref 0–200)
HDL: 25 mg/dL — AB (ref >40)
LDL CALC: 116 mg/dL — AB (ref 0–99)
Triglycerides: 235 mg/dL — ABNORMAL HIGH (ref <150)
VLDL: 47 mg/dL — AB (ref 0–40)

## 2015-09-30 LAB — MAGNESIUM: Magnesium: 1.8 mg/dL (ref 1.7–2.4)

## 2015-09-30 LAB — TROPONIN I
Troponin I: <0.03 ng/mL (ref <0.031)
Troponin I: <0.03 ng/mL (ref <0.031)

## 2015-09-30 LAB — CBG MONITORING, ED: Glucose-Capillary: 90 mg/dL (ref 65–99)

## 2015-09-30 MED ORDER — PRAVASTATIN SODIUM 40 MG PO TABS: 40.0000 mg | ORAL_TABLET | Freq: Every day | ORAL | Status: DC

## 2015-09-30 MED ORDER — SODIUM CHLORIDE 0.9 % IV SOLN: 250.0000 mL | INTRAVENOUS | Status: DC | PRN

## 2015-09-30 MED ORDER — ALPRAZOLAM 0.5 MG PO TABS
0.5000 mg | ORAL_TABLET | Freq: Every evening | ORAL | Status: DC | PRN
  Administered 2015-09-30: 0.5 mg via ORAL
  Filled 2015-09-30: qty 1

## 2015-09-30 MED ORDER — ACETAMINOPHEN 325 MG PO TABS: 650.0000 mg | ORAL_TABLET | ORAL | Status: DC | PRN

## 2015-09-30 MED ORDER — METFORMIN HCL 500 MG PO TABS
1000.0000 mg | ORAL_TABLET | Freq: Two times a day (BID) | ORAL | Status: DC
  Administered 2015-09-30 (×2): 1000 mg via ORAL
  Filled 2015-09-30 (×2): qty 2

## 2015-09-30 MED ORDER — ENOXAPARIN SODIUM 40 MG/0.4ML ~~LOC~~ SOLN
40.0000 mg | SUBCUTANEOUS | Status: DC
  Administered 2015-09-30: 40 mg via SUBCUTANEOUS
  Filled 2015-09-30: qty 0.4

## 2015-09-30 MED ORDER — LOVASTATIN 20 MG PO TABS: 40.0000 mg | ORAL_TABLET | Freq: Every day | ORAL | Status: DC

## 2015-09-30 MED ORDER — MORPHINE SULFATE (PF) 2 MG/ML IV SOLN: 2.0000 mg | INTRAVENOUS | Status: DC | PRN

## 2015-09-30 MED ORDER — METOPROLOL TARTRATE 25 MG PO TABS
25.0000 mg | ORAL_TABLET | Freq: Two times a day (BID) | ORAL | Status: DC
  Administered 2015-09-30: 25 mg via ORAL
  Filled 2015-09-30: qty 1

## 2015-09-30 MED ORDER — PRAVASTATIN SODIUM 20 MG PO TABS: 20.0000 mg | ORAL_TABLET | Freq: Every day | ORAL | Status: DC

## 2015-09-30 MED ORDER — SODIUM CHLORIDE 0.9 % IJ SOLN
3.0000 mL | INTRAMUSCULAR | Status: DC | PRN
Start: 2015-09-30 — End: 2015-09-30

## 2015-09-30 MED ORDER — AMLODIPINE BESYLATE 5 MG PO TABS: 5.0000 mg | ORAL_TABLET | Freq: Every day | ORAL | Status: DC

## 2015-09-30 MED ORDER — FOLIC ACID 1 MG PO TABS
1.0000 mg | ORAL_TABLET | Freq: Every morning | ORAL | Status: DC
  Administered 2015-09-30: 1 mg via ORAL
  Filled 2015-09-30: qty 1

## 2015-09-30 MED ORDER — LOSARTAN POTASSIUM 50 MG PO TABS
50.0000 mg | ORAL_TABLET | Freq: Every morning | ORAL | Status: DC
  Administered 2015-09-30: 50 mg via ORAL
  Filled 2015-09-30: qty 1

## 2015-09-30 MED ORDER — INSULIN GLARGINE 100 UNIT/ML ~~LOC~~ SOLN
60.0000 [IU] | Freq: Every day | SUBCUTANEOUS | Status: DC
  Administered 2015-09-30: 60 [IU] via SUBCUTANEOUS
  Filled 2015-09-30: qty 0.6

## 2015-09-30 MED ORDER — SODIUM CHLORIDE 0.9 % IJ SOLN: 3.0000 mL | Freq: Two times a day (BID) | INTRAMUSCULAR | Status: DC

## 2015-09-30 MED ORDER — ALBUTEROL SULFATE (2.5 MG/3ML) 0.083% IN NEBU: 2.5000 mg | INHALATION_SOLUTION | RESPIRATORY_TRACT | Status: DC | PRN

## 2015-09-30 MED ORDER — GABAPENTIN 300 MG PO CAPS
300.0000 mg | ORAL_CAPSULE | Freq: Three times a day (TID) | ORAL | Status: DC
  Administered 2015-09-30 (×2): 300 mg via ORAL
  Filled 2015-09-30 (×2): qty 1

## 2015-09-30 MED ORDER — ONDANSETRON HCL 4 MG/2ML IJ SOLN: 4.0000 mg | Freq: Four times a day (QID) | INTRAMUSCULAR | Status: DC | PRN

## 2015-09-30 MED ORDER — PANTOPRAZOLE SODIUM 40 MG PO TBEC
40.0000 mg | DELAYED_RELEASE_TABLET | Freq: Every day | ORAL | Status: DC
  Administered 2015-09-30: 40 mg via ORAL
  Filled 2015-09-30: qty 1

## 2015-09-30 MED ORDER — ASPIRIN EC 325 MG PO TBEC
325.0000 mg | DELAYED_RELEASE_TABLET | Freq: Every day | ORAL | Status: DC
  Administered 2015-09-30: 325 mg via ORAL
  Filled 2015-09-30: qty 1

## 2015-09-30 MED ORDER — ADALIMUMAB 40 MG/0.8ML ~~LOC~~ KIT: 40.0000 mg | PACK | SUBCUTANEOUS | Status: DC

## 2015-09-30 MED ORDER — SODIUM CHLORIDE 0.9 % IJ SOLN
3.0000 mL | Freq: Two times a day (BID) | INTRAMUSCULAR | Status: DC
  Administered 2015-09-30: 3 mL via INTRAVENOUS

## 2015-09-30 NOTE — H&P (Signed)
[facility] History and Physical  RIELEY KHALSA AYT:[id number] DOB: May 05, 1956 DOA: 09/29/2015  Referring physician: Davonna Belling, MD  PCP: Lenard Galloway, NP   Chief Complaint: Chest pain.  HPI: Carrie [name] is a 59 y.o. female with a past medical history of diabetes mellitus, obesity, osteoporosis, hyperlipidemia, hypertension, depression, anxiety, GERD, diabetic gastroparesis, psoriatic arthritis who comes to the emergency department due to having precordial chest pain, pressure-like, nonpleuritic, radiated to her left arm, associated with one episode of nausea and emesis, dizziness, palpitations after she had dinner at around 1800 today. She denies edema PND or orthopnea. The pain resolve on its own after about an hour, but she had her son bringing her to the emergency department. She is chest pain-free and in no acute distress at this time.    Review of Systems:  Constitutional:  No weight loss, night sweats, Fevers, chills, fatigue.  HEENT:  No headaches, Difficulty swallowing,Tooth/dental problems,Sore throat,  No sneezing, itching, ear ache, nasal congestion, post nasal drip,  Cardio-vascular:  As above mentioned. GI:  Positive nausea and emesis. Denies abdominal pain, diarrhea, melena or hematochezia. Resp:  No shortness of breath with exertion or at rest. No excess mucus, no productive cough, No non-productive cough, No coughing up of blood.No change in color of mucus.No wheezing.No chest wall deformity  Skin:  no rash or lesions.  GU:  no dysuria, change in color of urine, no urgency or frequency. No flank pain.  Musculoskeletal:  No joint pain or swelling. No decreased range of motion. No back pain.  Psych:  No change in mood or affect. No depression or anxiety. No memory loss.   Past Medical History  Diagnosis Date  . GERD (gastroesophageal reflux disease)   . Gastritis   . Stricture and stenosis of esophagus   . Obesity   . Diabetes  mellitus   . Depression   . Asthma   . Hyperlipemia   . Hernia   . Hypertension   . Anxiety   . Osteoporosis   . Tuberculosis     tested positive 2011, no symptoms, was on medicine for 6 months  . TIA (transient ischemic attack)   . Plantar fasciitis   . Arthritis     psoriatic arithritis, DDD  . Neuropathy (HCC)     Bil feet re: to Diabetes  . Gastroparesis   . Hyperlipemia    Past Surgical History  Procedure Laterality Date  . Knee surgery      Bilateral  replacement  . Cesarean section      x 2   . Breast surgery      biopsies both breast  . Joint replacement      bilat knee    Social History:  reports that she has never smoked. She has never used smokeless tobacco. She reports that she drinks alcohol. She reports that she does not use illicit drugs.  Allergies  Allergen Reactions  . Lisinopril Cough    Family History  Problem Relation Age of Onset  . Colon cancer Neg Hx   . Esophageal cancer Neg Hx   . Stomach cancer Neg Hx   . Rectal cancer Neg Hx   . Diabetes      maternal aunts and uncles  . Diabetes Father   . Liver disease Father   . Emphysema Father     smoked  . Diabetes Sister   . Diabetes Brother   . Sarcoidosis Brother   . Emphysema Mother  smoked  . Asthma Mother   . Asthma Brother       Prior to Admission medications   Medication Sig Start Date End Date Taking? Authorizing Provider  adalimumab (HUMIRA) 40 MG/0.8ML injection Inject 40 mg into the skin once a week. Sundays.   Yes Historical Provider, MD  albuterol (PROAIR HFA) 108 (90 BASE) MCG/ACT inhaler Inhale 2 puffs into the lungs every 4 (four) hours as needed for wheezing or shortness of breath.   Yes Historical Provider, MD  albuterol (PROVENTIL) (2.5 MG/3ML) 0.083% nebulizer solution Take 2.5 mg by nebulization every 4 (four) hours as needed for wheezing or shortness of breath.  10/20/12  Yes Historical Provider, MD  aspirin EC 81 MG tablet Take 81 mg by mouth every morning.    Yes Historical Provider, MD  Calcium Carbonate-Vitamin D 600-400 MG-UNIT per tablet Take 1 tablet by mouth daily. 04/26/15  Yes Historical Provider, MD  ergocalciferol (VITAMIN D2) 50000 UNITS capsule Take 50,000 Units by mouth once a week. Take on Wednesdays   Yes Historical Provider, MD  esomeprazole (NEXIUM) 40 MG capsule Take 40 mg by mouth at bedtime.   Yes Historical Provider, MD  folic acid (FOLVITE) 1 MG tablet Take 1 mg by mouth every morning.    Yes Historical Provider, MD  gabapentin (NEURONTIN) 300 MG capsule Take 300 mg by mouth 3 (three) times daily. 04/26/15  Yes Historical Provider, MD  insulin glargine (LANTUS) 100 UNIT/ML injection Inject 0.6 mLs (60 Units total) into the skin every morning. And syringes 1/day 08/16/15  Yes Renato Shin, MD  losartan (COZAAR) 50 MG tablet Take 50 mg by mouth every morning.   Yes Historical Provider, MD  lovastatin (MEVACOR) 20 MG tablet Take 20 mg by mouth every evening. 05/25/15  Yes Historical Provider, MD  metFORMIN (GLUCOPHAGE) 1000 MG tablet Take 1,000 mg by mouth 2 (two) times daily with a meal.  03/29/11  Yes Historical Provider, MD  methotrexate (RHEUMATREX) 2.5 MG tablet Take 25 mg by mouth once a week. Caution:Chemotherapy. Protect from light.  Taken on Wednesdays.   Yes Historical Provider, MD  zoledronic acid (RECLAST) 5 MG/100ML SOLN Inject 5 mg into the vein. Received yearly dose 06/11/13   Yes Historical Provider, MD  HYDROcodone-acetaminophen (NORCO/VICODIN) 5-325 MG per tablet Take 2 tablets by mouth every 4 (four) hours as needed. Patient not taking: Reported on 08/16/2015 07/13/14   Ashley Murrain, NP   Physical Exam: Filed Vitals:   09/29/15 2359 09/30/15 0000 09/30/15 0003 09/30/15 0103  BP: 162/131 162/131 165/94 162/81  Pulse: 89 91 86 73  Temp:    98.7 F (37.1 C)  TempSrc:    Oral  Resp: 15  16 16   Height:    5' (1.524 m)  Weight:    80.922 kg (178 lb 6.4 oz)  SpO2: 97%  97% 96%    Wt Readings from Last 3 Encounters:    09/30/15 80.922 kg (178 lb 6.4 oz)  09/18/15 81.194 kg (179 lb)  09/06/15 82.101 kg (181 lb)    General:  Appears calm and comfortable Eyes: PERRL, normal lids, irises & conjunctiva ENT: grossly normal hearing, lips & tongue Neck: no LAD, masses or thyromegaly Cardiovascular: RRR, no m/r/g. No LE edema. Telemetry: SR, no arrhythmias  Respiratory: CTA bilaterally, no w/r/r. Normal respiratory effort. Abdomen: soft, ntnd Skin: no rash or induration seen on limited exam Musculoskeletal: grossly normal tone BUE/BLE Psychiatric: grossly normal mood and affect, speech fluent and appropriate Neurologic: grossly non-focal.  Labs on Admission:  Basic Metabolic Panel:  Recent Labs Lab 09/29/15 1945 09/29/15 2351  NA 140  --   K 3.9  --   CL 104  --   CO2 26  --   GLUCOSE 94  --   BUN 16  --   CREATININE 0.98  --   CALCIUM 9.6  --   MG  --  1.8    CBC:  Recent Labs Lab 09/29/15 1945  WBC 12.8*  HGB 13.4  HCT 39.2  MCV 89.3  PLT 185     CBG:  Recent Labs Lab 09/29/15 2025 09/29/15 2249  GLUCAP 75 90    Radiological Exams on Admission: Dg Chest 2 View  09/29/2015  CLINICAL DATA:  Left side chest pain, elevated blood pressure EXAM: CHEST  2 VIEW COMPARISON:  06/25/2015 FINDINGS: Cardiomediastinal silhouette is stable. No acute infiltrate or pleural effusion. No pulmonary edema. Bony thorax is unremarkable. IMPRESSION: No active cardiopulmonary disease. Electronically Signed   By: Lahoma Crocker M.D.   On: 09/29/2015 20:16    EKG: Independently reviewed. Vent. rate 93 BPM PR interval 124 ms QRS duration 82 ms QT/QTc 374/465 ms P-R-T axes 55 73 11 Normal sinus rhythm nonspecific inferior T wave changes  Assessment/Plan Principal Problem:   Chest pain Admit to telemetry. Trend troponin levels. Metoprolol 25 mg by mouth twice a day. Increase aspirin to 325 mg by mouth daily. Check echocardiogram.  Active Problems:   Type 1 diabetes mellitus  (HCC) Continue long-acting insulin. CBG monitoring with regular insulin sliding scale.    Hyperlipidemia Continue lovastatin and monitor LFTs periodically.    Asthma As symptomatic at this time. Bronchodilators as needed.    HTN (hypertension) Currently not well controlled. The patient states she has been getting high readings at home, tonight was 177/114 mm/Hg. Continue losartan 50 mg by mouth daily. Start metoprolol 25 mg by mouth twice a day. Monitor blood pressure.    Code Status: Full code. DVT Prophylaxis: Lovenox SQ. Family Communication:  Disposition Plan: Admit to telemetry, trend troponin levels, check echocardiogram.  Time spent: Over 70 minutes were spent in the process of his admission.  Reubin Milan [facility] Pager [phone]

## 2015-09-30 NOTE — ED Notes (Signed)
Attempted report 

## 2015-09-30 NOTE — Consult Note (Signed)
CARDIOLOGY CONSULT NOTE   Patient ID: Carrie [name] MRN: [id number] DOB/AGE: 58-Mar-1957 58 y.o.  Admit date: 09/29/2015  Primary Physician   Lenard Galloway, NP Primary Cardiologist   (prior Dr. Warren Lacy, cath Dr. Lia Foyer 2011) Never seen in clinic Reason for Consultation   Chest pain  HPI: Carrie [name] is a 59 y.o. female with a history of DM, HTN, GERD, TIA, esophageal stricture (Dr. Deatra Ina), Aortic atherosclerosis on CT, gastroparesis, psoriatic arthritis who came to hospital last night for evaluation of left upper/mid chest pain. Recent develop a sudden onset of shortness of breath while watching the TV after eating Sandwich and chips. Followed by chest pain and she had a 1 episode of nausea and vomiting. She also admits to having diaphoresis and somewhat radiation of pain to her left arm. The patient denies orthopnea, PND, syncope, lower extremity edema, dizziness, melena, abdominal pain, blood in her stool.  No history of smoking. No recent long distant travel. Last Myoview 11/2013 was normal.   In ED, her blood pressure was elevated. EKG normal sinus rhythm with specific T wave abnormality. Troponin negative.   Past Medical History  Diagnosis Date  . GERD (gastroesophageal reflux disease)   . Gastritis   . Stricture and stenosis of esophagus   . Obesity   . Diabetes mellitus   . Depression   . Asthma   . Hyperlipemia   . Hernia   . Hypertension   . Anxiety   . Osteoporosis   . Tuberculosis     tested positive 2011, no symptoms, was on medicine for 6 months  . TIA (transient ischemic attack)   . Plantar fasciitis   . Arthritis     psoriatic arithritis, DDD  . Neuropathy (HCC)     Bil feet re: to Diabetes  . Gastroparesis   . Hyperlipemia      Past Surgical History  Procedure Laterality Date  . Knee surgery      Bilateral  replacement  . Cesarean section      x 2   . Breast surgery      biopsies both breast  . Joint replacement      bilat knee      Allergies  Allergen Reactions  . Lisinopril Cough    I have reviewed the patient's current medications . adalimumab  40 mg Subcutaneous Weekly  . aspirin EC  325 mg Oral Daily  . enoxaparin (LOVENOX) injection  40 mg Subcutaneous Q24H  . folic acid  1 mg Oral q morning - 10a  . gabapentin  300 mg Oral TID  . insulin glargine  60 Units Subcutaneous Daily  . losartan  50 mg Oral q morning - 10a  . metFORMIN  1,000 mg Oral BID WC  . metoprolol tartrate  25 mg Oral BID  . pantoprazole  40 mg Oral QHS  . pravastatin  20 mg Oral q1800  . sodium chloride  3 mL Intravenous Q12H  . sodium chloride  3 mL Intravenous Q12H     sodium chloride, acetaminophen, albuterol, ALPRAZolam, morphine injection, ondansetron (ZOFRAN) IV, sodium chloride  Prior to Admission medications   Medication Sig Start Date End Date Taking? Authorizing Provider  adalimumab (HUMIRA) 40 MG/0.8ML injection Inject 40 mg into the skin once a week. Sundays.   Yes Historical Provider, MD  albuterol (PROAIR HFA) 108 (90 BASE) MCG/ACT inhaler Inhale 2 puffs into the lungs every 4 (four) hours as needed for wheezing or shortness of breath.   Yes  Historical Provider, MD  albuterol (PROVENTIL) (2.5 MG/3ML) 0.083% nebulizer solution Take 2.5 mg by nebulization every 4 (four) hours as needed for wheezing or shortness of breath.  10/20/12  Yes Historical Provider, MD  aspirin EC 81 MG tablet Take 81 mg by mouth every morning.   Yes Historical Provider, MD  Calcium Carbonate-Vitamin D 600-400 MG-UNIT per tablet Take 1 tablet by mouth daily. 04/26/15  Yes Historical Provider, MD  ergocalciferol (VITAMIN D2) 50000 UNITS capsule Take 50,000 Units by mouth once a week. Take on Wednesdays   Yes Historical Provider, MD  esomeprazole (NEXIUM) 40 MG capsule Take 40 mg by mouth at bedtime.   Yes Historical Provider, MD  folic acid (FOLVITE) 1 MG tablet Take 1 mg by mouth every morning.    Yes Historical Provider, MD  gabapentin (NEURONTIN)  300 MG capsule Take 300 mg by mouth 3 (three) times daily. 04/26/15  Yes Historical Provider, MD  insulin glargine (LANTUS) 100 UNIT/ML injection Inject 0.6 mLs (60 Units total) into the skin every morning. And syringes 1/day 08/16/15  Yes Renato Shin, MD  losartan (COZAAR) 50 MG tablet Take 50 mg by mouth every morning.   Yes Historical Provider, MD  lovastatin (MEVACOR) 20 MG tablet Take 20 mg by mouth every evening. 05/25/15  Yes Historical Provider, MD  metFORMIN (GLUCOPHAGE) 1000 MG tablet Take 1,000 mg by mouth 2 (two) times daily with a meal.  03/29/11  Yes Historical Provider, MD  methotrexate (RHEUMATREX) 2.5 MG tablet Take 25 mg by mouth once a week. Caution:Chemotherapy. Protect from light.  Taken on Wednesdays.   Yes Historical Provider, MD  zoledronic acid (RECLAST) 5 MG/100ML SOLN Inject 5 mg into the vein. Received yearly dose 06/11/13   Yes Historical Provider, MD  HYDROcodone-acetaminophen (NORCO/VICODIN) 5-325 MG per tablet Take 2 tablets by mouth every 4 (four) hours as needed. Patient not taking: Reported on 08/16/2015 07/13/14   Ashley Murrain, NP     Social History   Social History  . Marital Status: Single    Spouse Name: N/A  . Number of Children: 2  . Years of Education: N/A   Occupational History  . Disabled     Social History Main Topics  . Smoking status: Never Smoker   . Smokeless tobacco: Never Used  . Alcohol Use: Yes     Comment: socially  . Drug Use: No  . Sexual Activity: Not on file   Other Topics Concern  . Not on file   Social History Narrative   [place] daily     No family status information on file.   Family History  Problem Relation Age of Onset  . Colon cancer Neg Hx   . Esophageal cancer Neg Hx   . Stomach cancer Neg Hx   . Rectal cancer Neg Hx   . Diabetes      maternal aunts and uncles  . Diabetes Father   . Liver disease Father   . Emphysema Father     smoked  . Diabetes Sister   . Diabetes Brother   . Sarcoidosis Brother   .  Emphysema Mother     smoked  . Asthma Mother   . Asthma Brother        ROS:  Full 14 point review of systems complete and found to be negative unless listed above.  Physical Exam: Blood pressure 114/59, pulse 70, temperature 97.7 F (36.5 C), temperature source Oral, resp. rate 16, height 5' (1.524 m), weight 178 lb 6.4  oz (80.922 kg), SpO2 97 %.  General: Well developed, well nourished, female in no acute distress Head: Eyes PERRLA, No xanthomas. Normocephalic and atraumatic, oropharynx without edema or exudate.  Lungs: Resp regular and unlabored, CTA. TTP over substernal area.  Heart: RRR no s3, s4, or murmurs..   Neck: No carotid bruits. No lymphadenopathy. No JVD. Abdomen: Bowel sounds present, abdomen soft and non-tender without masses or hernias noted. Msk:  No spine or cva tenderness. No weakness, no joint deformities or effusions. Extremities: No clubbing, cyanosis or edema. DP/PT/Radials 2+ and equal bilaterally. Neuro: Alert and oriented X 3. No focal deficits noted. Psych:  Good affect, responds appropriately Skin: No rashes or lesions noted.  Labs:   Lab Results  Component Value Date   WBC 9.6 09/30/2015   HGB 13.5 09/30/2015   HCT 40.9 09/30/2015   MCV 91.7 09/30/2015   PLT 164 09/30/2015  Echo: Pending  ECG: Vent. rate 93 BPM PR interval 124 ms QRS duration 82 ms QT/QTc 374/465 ms P-R-T axes 55 73 11  Radiology:  Dg Chest 2 View  09/29/2015  CLINICAL DATA:  Left side chest pain, elevated blood pressure EXAM: CHEST  2 VIEW COMPARISON:  06/25/2015 FINDINGS: Cardiomediastinal silhouette is stable. No acute infiltrate or pleural effusion. No pulmonary edema. Bony thorax is unremarkable. IMPRESSION: No active cardiopulmonary disease. Electronically Signed   By: Lahoma Crocker M.D.   On: 09/29/2015 20:16    ASSESSMENT AND PLAN:     1. Chest pain - Her chest pain has both typical and atypical features. Currently chest pain-free. EKG nonischemic. Troponin was 2  negative. TSH negative. Chest x-ray clear. - Her pain is reproducible with palpation. He presented musculoskeletal in nature. No injury. Differential also includes a hypertensive urgency, her systolic blood pressure was in 170s during admission. -Pending echocardiogram. - No inpatient ischemic workup unless abnormal echocardiogram. Consider outpatient Myoview. She can eat.  2. Hypertension. - Her blood pressure was elevated on admission. Now improved. Continue current regimen.  3. Hyperlipidemia -No results found for requested labs within last 365 days.  - Continue statin. - Will get lipid panel for risk stratification.  4. Type 1 diabetes mellitus ([place]) - Per primary.  5. Leukocytosis - Resolved.   SignedLeanor Kail, [place] 09/30/2015, 9:21 AM Pager [phone]  Co-Sign MD  Patient seen and examined. Agree with assessment and plan. Ms Simcha Farrington a 59 year old female who has a history of diabetes mellitus, essential hypertension, GERD, and remotely had had esophageal stricture.  She has psoriatic arthritis for which she has been on Humira and methotrexate. .  There is a history of prior TIA.  She has a strong family history for CAD with her father dying of a myocardial infarction at age 95; however, he was a heavy smoker.  The patient denies any history of tobacco use.  She has been on losartan 50 mg daily for hypertension and lovastatin for hyperlipidemia.  Yesterday while at rest, she developed chest sensation which she described as a heaviness in the setting of her blood pressure being elevated.  She had associated nausea and vomiting.  She specifically denies any exertional precipitation of any chest discomfort.  She also has noticed chest soreness to palpation.  He denies recent heavy lifting.  On presentation, her cardiac markers were negative 2.  Physical examination is notable for significant chest wall tenderness particularly over the left costochondral region which he states  mimics her chest pain.  Her ECG does not show any acute ST-T  changes.  At this point, suspect musculoskeletal etiology to her chest wall discomfort rather than ischemic symptomatology.  I favor proceeding with the planned echo Doppler study, which was just completed to assess systolic and diastolic function, valvular architecture, as well as her pericardium .  With her diabetes mellitus, aggressive lipid intervention with a target LDL less than 70 is recommended.  If her echo Doppler study is without significant abnormality, particularly with reference to wall motion, he will probably be okay to discharge the patient later today with plans for outpatient nuclear stress test evaluation in light of her cardiac risk factor profile.   Troy Sine, MD, [facility] 09/30/2015 11:16 AM

## 2015-09-30 NOTE — Care Management Obs Status (Signed)
[facility] NOTIFICATION   Patient Details  Name: Carrie [name] MRN: [id number] Date of Birth: 04-19-1956   Medicare Observation Status Notification Given:  Yes    Bethena Roys, RN 09/30/2015, 3:27 PM

## 2015-09-30 NOTE — Discharge Summary (Signed)
Physician Discharge Summary  Carrie [name] HYI:[id number] DOB: 1956-06-08 DOA: 09/29/2015  PCP: Lenard Galloway, NP  Admit date: 09/29/2015 Discharge date: 09/30/2015  Time spent: 25 minutes  Recommendations for Outpatient Follow-up:  1. 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 gastroparesis and neuropathy   Hyperlipidemia   Asthma   HTN (hypertension)   Discharge Condition: Fair  Diet recommendation: Heart healthy/diabetic  Filed Weights   09/30/15 0103  Weight: 80.922 kg (178 lb 6.4 oz)    History of present illness:  Please refer to admission H&P for details, in brief, 59 year old female with history of diabetes mellitus on insulin, hyperlipidemia, hypertension, anxiety and depression, GERD, presented with pressure-like substernal chest pain radiating to her left arm with an episode of nausea and vomiting associated with dizziness and mild palpitation on the evening of admission. She reports watching TV when she had no symptoms. Reports pain of 10/10 in severity without any aggravating or relieving factors. Symptoms subsided on its own in about an hour but her son insisted her coming to the ED. In the ED she was chest pain-free. Vitals were stable. Labs including initial troponin was negative. EKG was unremarkable. Admitted for ACS rule out.  Hospital Course:  Chest pain, atypical Possibly musculoskeletal versus GERD Serial troponin have been negative. If she remains pain-free. Repeat EKG normal and stable on telemetry. 2-D echo done showed normal EF (60-65%) with no wall motion abnormality. Patient is on baby aspirin which should be continued. Her LDL is 116 and above her goal. Will increase lovastatin to 40 mg daily. -Patient is stable to be discharged home with outpatient follow-up.  Type 2 diabetes mellitus with  neuropathy and associated gastroparesis Continue home dose insulin and metformin. Stable. Follows with Dr. Loanne Drilling as outpatient  Hyperlipidemia Increase dose of lovastatin. Triglyceride of 235 and LDL of 116  Hx of asthma Asymptomatic.  Essential hypertension Elevated BP on presentation. Reports high BP at home. Will add low dose amlodipine.  CODE STATUS: Full code Family communication: None at bedside  She will be discharged home with outpatient follow-up.  Procedures:  2d echo  Consultations:  Cardiology  Discharge Exam: Filed Vitals:   09/30/15 0929 09/30/15 1400  BP: 146/86 129/68  Pulse: 69 69  Temp:  97.7 F (36.5 C)  Resp:  18    General: Elderly female not in distress HEENT: No pallor, moist mucosa Chest: Clear to auscultation bilaterally CVS: Normal S1 and S2, no murmurs rub or gallop GI: Soft, nondistended, nontender, bowel sounds present Musculoskeletal: Warm, no edema CNS: Alert and oriented   Discharge Instructions    Current Discharge Medication List   NEW MEDICATION: Amlodipine 5 mg tablet                           take 1 tablet (5 mg) by mouth daily                                                                   Qty: 30 tablet, refills: 0     CONTINUE these medications which have CHANGED  Details  lovastatin (MEVACOR) 20 MG tablet Take 2 tablets (40 mg total) by mouth daily at 6 PM. Qty: 30 tablet, Refills: 0      CONTINUE these medications which have NOT CHANGED   Details  adalimumab (HUMIRA) 40 MG/0.8ML injection Inject 40 mg into the skin once a week. Sundays.    albuterol (PROAIR HFA) 108 (90 BASE) MCG/ACT inhaler Inhale 2 puffs into the lungs every 4 (four) hours as needed for wheezing or shortness of breath.    albuterol (PROVENTIL) (2.5 MG/3ML) 0.083% nebulizer solution Take 2.5 mg by nebulization every 4 (four) hours as needed for wheezing or shortness of breath.     aspirin EC 81 MG tablet Take 81 mg by mouth every  morning.    Calcium Carbonate-Vitamin D 600-400 MG-UNIT per tablet Take 1 tablet by mouth daily.    ergocalciferol (VITAMIN D2) 50000 UNITS capsule Take 50,000 Units by mouth once a week. Take on Wednesdays    esomeprazole (NEXIUM) 40 MG capsule Take 40 mg by mouth at bedtime.    folic acid (FOLVITE) 1 MG tablet Take 1 mg by mouth every morning.     gabapentin (NEURONTIN) 300 MG capsule Take 300 mg by mouth 3 (three) times daily. Refills: 5    insulin glargine (LANTUS) 100 UNIT/ML injection Inject 0.6 mLs (60 Units total) into the skin every morning. And syringes 1/day Qty: 20 mL, Refills: 11    losartan (COZAAR) 50 MG tablet Take 50 mg by mouth every morning.    metFORMIN (GLUCOPHAGE) 1000 MG tablet Take 1,000 mg by mouth 2 (two) times daily with a meal.     methotrexate (RHEUMATREX) 2.5 MG tablet Take 25 mg by mouth once a week. Caution:Chemotherapy. Protect from light.  Taken on Wednesdays.    zoledronic acid (RECLAST) 5 MG/100ML SOLN Inject 5 mg into the vein. Received yearly dose 06/11/13      STOP taking these medications     HYDROcodone-acetaminophen (NORCO/VICODIN) 5-325 MG per tablet        Allergies  Allergen Reactions  . Lisinopril Cough   Follow-up Information    Follow up with Lenard Galloway, NP In 2 weeks.   Specialty:  Family Medicine   Contact information:   [place] [address] [phone]        The results of significant diagnostics from this hospitalization (including imaging, microbiology, ancillary and laboratory) are listed below for reference.    Significant Diagnostic Studies: Dg Chest 2 View  09/29/2015  CLINICAL DATA:  Left side chest pain, elevated blood pressure EXAM: CHEST  2 VIEW COMPARISON:  06/25/2015 FINDINGS: Cardiomediastinal silhouette is stable. No acute infiltrate or pleural effusion. No pulmonary edema. Bony thorax is unremarkable. IMPRESSION: No active cardiopulmonary disease. Electronically Signed    By: Lahoma Crocker M.D.   On: 09/29/2015 20:16    Microbiology: No results found for this or any previous visit (from the past 240 hour(s)).   Labs: Basic Metabolic Panel:  Recent Labs Lab 09/29/15 1945 09/29/15 2351 09/30/15 0320 09/30/15 0810  NA 140  --   --  143  K 3.9  --   --  4.0  CL 104  --   --  108  CO2 26  --   --  28  GLUCOSE 94  --   --  84  BUN 16  --   --  14  CREATININE 0.98  --  0.87 0.98  CALCIUM 9.6  --   --  9.0  MG  --  1.8  --   --    Liver Function Tests:  Recent Labs Lab 09/30/15 0810  AST 25  ALT 16  ALKPHOS 48  BILITOT 1.0  PROT 7.4  ALBUMIN 3.6   No results for input(s): LIPASE, AMYLASE in the last 168 hours. No results for input(s): AMMONIA in the last 168 hours. CBC:  Recent Labs Lab 09/29/15 1945 09/30/15 0320 09/30/15 0810  WBC 12.8* 11.5* 9.6  HGB 13.4 13.0 13.5  HCT 39.2 39.1 40.9  MCV 89.3 90.5 91.7  PLT 185 163 164   Cardiac Enzymes:  Recent Labs Lab 09/30/15 0320 09/30/15 0810  TROPONINI <0.03 <0.03   BNP: BNP (last 3 results) No results for input(s): BNP in the last 8760 hours.  ProBNP (last 3 results) No results for input(s): PROBNP in the last 8760 hours.  CBG:  Recent Labs Lab 09/29/15 2025 09/29/15 2249  GLUCAP 75 90       Signed:  Tel Hevia  [facility] 09/30/2015, 3:43 PM

## 2015-09-30 NOTE — Progress Notes (Signed)
  Echocardiogram 2D Echocardiogram has been performed.  Carrie [name] 09/30/2015, 11:11 AM

## 2015-09-30 NOTE — Progress Notes (Signed)
Inpatient Diabetes Program Recommendations  AACE/ADA: New Consensus Statement on Inpatient Glycemic Control (2015)  Target Ranges:  Prepandial:   less than 140 mg/dL      Peak postprandial:   less than 180 mg/dL (1-2 hours)      Critically ill patients:  140 - 180 mg/dL  Results for Carrie [name], Carrie [name] (MRN [id number]) as of 09/30/2015 09:15  Ref. Range 09/29/2015 20:25 09/29/2015 22:49  Glucose-Capillary Latest Ref Range: 65-99 mg/dL 75 90   Review of Glycemic Control  Outpatient Diabetes medications: Lantus 60 units QAM, Metformin 1000 mg BID Current orders for Inpatient glycemic control: Lantus 60 units daily, Metformin 1000  mg BID  Inpatient Diabetes Program Recommendations: Correction (SSI): Please consider ordering CBGs with Novolog correction scale ACHS. Insulin-basal: May want to consider decreasing Lantus if patient experiences any hypoglycemia episodes.  Thanks, Barnie Alderman, RN, MSN, CDE Diabetes Coordinator Inpatient Diabetes Program [phone] (Team Pager from [place] to [place]) [phone] ([place] office) [phone] [facility] office) [phone] [facility] office)

## 2015-11-17 ENCOUNTER — Ambulatory Visit: Payer: Self-pay | Admitting: Endocrinology

## 2015-11-18 ENCOUNTER — Ambulatory Visit (INDEPENDENT_AMBULATORY_CARE_PROVIDER_SITE_OTHER): Payer: Medicare PPO | Admitting: Endocrinology

## 2015-11-18 ENCOUNTER — Encounter: Payer: Self-pay | Admitting: Endocrinology

## 2015-11-18 VITALS — BP 132/86 | HR 77 | Temp 97.7°F | Ht 60.0 in | Wt 182.0 lb

## 2015-11-18 DIAGNOSIS — E109 Type 1 diabetes mellitus without complications: Secondary | ICD-10-CM | POA: Diagnosis not present

## 2015-11-18 LAB — POCT GLYCOSYLATED HEMOGLOBIN (HGB A1C): HEMOGLOBIN A1C: 7

## 2015-11-18 NOTE — Patient Instructions (Addendum)
check your blood sugar twice a day.  vary the time of day when you check, between before the 3 meals, and at bedtime.  also check if you have symptoms of your blood sugar being too high or too low.  please keep a record of the readings and bring it to your next appointment here (or you can bring the meter itself).  You can write it on any piece of paper.  please call us sooner if your blood sugar goes below 70, or if you have a lot of readings over 200.   Please continue the same lantus: 60 units every morning, and:  Please continue the same metformin, because it may be helping your gastroparesis.  On this type of insulin schedule, you should eat meals on a regular schedule.  If a meal is missed or significantly delayed, your blood sugar could go low.   Please come back for a follow-up appointment in 3-4 months.

## 2015-11-18 NOTE — Progress Notes (Signed)
Subjective:    Patient ID: Carrie [name], female    DOB: 1956/01/29, 60 y.o.   MRN: [id number]  HPI Pt returns for f/u of diabetes mellitus: DM type: Insulin-requiring type 2 Dx'ed: 4270 Complications: painful polyneuropathy and gastroparesis.  Therapy: insulin since 2011 GDM: 2000 DKA: never Severe hypoglycemia: never Pancreatitis: never Other: she did not tolerate invokana (vaginitis); she was on multiple daily injections in the past, but could not remember to take. Interval history: no cbg record, but states cbg's are sometimes mildly low in the afternoon.  This happens when she misses lunch.   Past Medical History  Diagnosis Date  . GERD (gastroesophageal reflux disease)   . Gastritis   . Stricture and stenosis of esophagus   . Obesity   . Diabetes mellitus   . Depression   . Asthma   . Hyperlipemia   . Hernia   . Hypertension   . Anxiety   . Osteoporosis   . Tuberculosis     tested positive 2011, no symptoms, was on medicine for 6 months  . TIA (transient ischemic attack)   . Plantar fasciitis   . Arthritis     psoriatic arithritis, DDD  . Neuropathy (HCC)     Bil feet re: to Diabetes  . Gastroparesis   . Hyperlipemia     Past Surgical History  Procedure Laterality Date  . Knee surgery      Bilateral  replacement  . Cesarean section      x 2   . Breast surgery      biopsies both breast  . Joint replacement      bilat knee     Social History   Social History  . Marital Status: Single    Spouse Name: N/A  . Number of Children: 2  . Years of Education: N/A   Occupational History  . Disabled     Social History Main Topics  . Smoking status: Never Smoker   . Smokeless tobacco: Never Used  . Alcohol Use: Yes     Comment: socially  . Drug Use: No  . Sexual Activity: Not on file   Other Topics Concern  . Not on file   Social History Narrative   [place] daily     Current Outpatient Prescriptions on File Prior to Visit  Medication Sig  Dispense Refill  . adalimumab (HUMIRA) 40 MG/0.8ML injection Inject 40 mg into the skin once a week. Sundays.    Marland Kitchen albuterol (PROAIR HFA) 108 (90 BASE) MCG/ACT inhaler Inhale 2 puffs into the lungs every 4 (four) hours as needed for wheezing or shortness of breath.    Marland Kitchen albuterol (PROVENTIL) (2.5 MG/3ML) 0.083% nebulizer solution Take 2.5 mg by nebulization every 4 (four) hours as needed for wheezing or shortness of breath.     Marland Kitchen amLODipine (NORVASC) 5 MG tablet Take 1 tablet (5 mg total) by mouth daily. 30 tablet 0  . aspirin EC 81 MG tablet Take 81 mg by mouth every morning.    . Calcium Carbonate-Vitamin D 600-400 MG-UNIT per tablet Take 1 tablet by mouth daily.    . ergocalciferol (VITAMIN D2) 50000 UNITS capsule Take 50,000 Units by mouth once a week. Take on Wednesdays    . esomeprazole (NEXIUM) 40 MG capsule Take 40 mg by mouth at bedtime.    . folic acid (FOLVITE) 1 MG tablet Take 1 mg by mouth every morning.     . gabapentin (NEURONTIN) 300 MG capsule Take 300 mg by  mouth 3 (three) times daily.  5  . insulin glargine (LANTUS) 100 UNIT/ML injection Inject 0.6 mLs (60 Units total) into the skin every morning. And syringes 1/day 20 mL 11  . losartan (COZAAR) 50 MG tablet Take by mouth 2 (two) times daily. 100 mg in the morning 50 mg at bedtime.    . lovastatin (MEVACOR) 20 MG tablet Take 2 tablets (40 mg total) by mouth daily at 6 PM. 30 tablet 0  . metFORMIN (GLUCOPHAGE) 1000 MG tablet Take 1,000 mg by mouth 2 (two) times daily with a meal.     . methotrexate (RHEUMATREX) 2.5 MG tablet Take 25 mg by mouth once a week. Caution:Chemotherapy. Protect from light.  Taken on Wednesdays.    . zoledronic acid (RECLAST) 5 MG/100ML SOLN Inject 5 mg into the vein. Received yearly dose 06/11/13     No current facility-administered medications on file prior to visit.    Allergies  Allergen Reactions  . Lisinopril Cough    Family History  Problem Relation Age of Onset  . Colon cancer Neg Hx   .  Esophageal cancer Neg Hx   . Stomach cancer Neg Hx   . Rectal cancer Neg Hx   . Diabetes      maternal aunts and uncles  . Diabetes Father   . Liver disease Father   . Emphysema Father     smoked  . Diabetes Sister   . Diabetes Brother   . Sarcoidosis Brother   . Emphysema Mother     smoked  . Asthma Mother   . Asthma Brother     BP 132/86 mmHg  Pulse 77  Temp(Src) 97.7 F (36.5 C) (Oral)  Ht 5' (1.524 m)  Wt 182 lb (82.555 kg)  BMI 35.54 kg/m2  SpO2 94%  Review of Systems Denies LOC.      Objective:   Physical Exam VITAL SIGNS:  See vs page. GENERAL: no distress. SKIN:  Insulin injection sites at the anterior abdomen are normal.   A1c=7.0%    Assessment & Plan:  DM: this is the best control this pt should aim for, given this regimen, which does match insulin to her changing needs throughout the day.   Patient is advised the following: Patient Instructions  check your blood sugar twice a day.  vary the time of day when you check, between before the 3 meals, and at bedtime.  also check if you have symptoms of your blood sugar being too high or too low.  please keep a record of the readings and bring it to your next appointment here (or you can bring the meter itself).  You can write it on any piece of paper.  please call us sooner if your blood sugar goes below 70, or if you have a lot of readings over 200.   Please continue the same lantus: 60 units every morning, and:  Please continue the same metformin, because it may be helping your gastroparesis.  On this type of insulin schedule, you should eat meals on a regular schedule.  If a meal is missed or significantly delayed, your blood sugar could go low.   Please come back for a follow-up appointment in 3-4 months.

## 2016-02-16 ENCOUNTER — Ambulatory Visit (INDEPENDENT_AMBULATORY_CARE_PROVIDER_SITE_OTHER): Payer: Medicare PPO | Admitting: Endocrinology

## 2016-02-16 ENCOUNTER — Encounter: Payer: Self-pay | Admitting: Endocrinology

## 2016-02-16 VITALS — BP 142/76 | HR 77 | Temp 98.2°F | Wt 181.1 lb

## 2016-02-16 DIAGNOSIS — Z794 Long term (current) use of insulin: Secondary | ICD-10-CM

## 2016-02-16 DIAGNOSIS — E1142 Type 2 diabetes mellitus with diabetic polyneuropathy: Secondary | ICD-10-CM

## 2016-02-16 DIAGNOSIS — E119 Type 2 diabetes mellitus without complications: Secondary | ICD-10-CM | POA: Insufficient documentation

## 2016-02-16 LAB — MICROALBUMIN / CREATININE URINE RATIO
Creatinine,U: 149.5 mg/dL
MICROALB/CREAT RATIO: 1.7 mg/g (ref 0.0–30.0)
Microalb, Ur: 2.5 mg/dL — ABNORMAL HIGH (ref 0.0–1.9)

## 2016-02-16 LAB — POCT GLYCOSYLATED HEMOGLOBIN (HGB A1C): HEMOGLOBIN A1C: 6.9

## 2016-02-16 MED ORDER — INSULIN GLARGINE 100 UNIT/ML ~~LOC~~ SOLN: 55.0000 [IU] | SUBCUTANEOUS | Status: DC

## 2016-02-16 NOTE — Progress Notes (Signed)
Pre visit review using our clinic review tool, if applicable. No additional management support is needed unless otherwise documented below in the visit note. 

## 2016-02-16 NOTE — Patient Instructions (Addendum)
check your blood sugar twice a day.  vary the time of day when you check, between before the 3 meals, and at bedtime.  also check if you have symptoms of your blood sugar being too high or too low.  please keep a record of the readings and bring it to your next appointment here (or you can bring the meter itself).  You can write it on any piece of paper.  please call us sooner if your blood sugar goes below 70, or if you have a lot of readings over 200.   Please reduce the lantus to 55 units every morning, and:  Please continue the same metformin, because it may be helping your gastroparesis.  On this type of insulin schedule, you should eat meals on a regular schedule.  If a meal is missed or significantly delayed, your blood sugar could go low.   Please come back for a follow-up appointment in 4 months.

## 2016-02-16 NOTE — Progress Notes (Signed)
Subjective:    Patient ID: Carrie [name], female    DOB: [birth year], 60 y.o.   MRN: [id number]  HPI Pt returns for f/u of diabetes mellitus: DM type: Insulin-requiring type 2 Dx'ed: 2010. Complications: painful polyneuropathy and gastroparesis.  Therapy: insulin since 2011.  GDM: 2000.  DKA: never. Severe hypoglycemia: never.  Pancreatitis: never.  Other: she did not tolerate invokana (vaginitis); she was on multiple daily injections in the past, but could not remember to take. Interval history: no cbg record, but states cbg's vary from 100-200.    Past Medical History  Diagnosis Date  . GERD (gastroesophageal reflux disease)   . Gastritis   . Stricture and stenosis of esophagus   . Obesity   . Diabetes mellitus   . Depression   . Asthma   . Hyperlipemia   . Hernia   . Hypertension   . Anxiety   . Osteoporosis   . Tuberculosis     tested positive 2011, no symptoms, was on medicine for 6 months  . TIA (transient ischemic attack)   . Plantar fasciitis   . Arthritis     psoriatic arithritis, DDD  . Neuropathy (HCC)     Bil feet re: to Diabetes  . Gastroparesis   . Hyperlipemia     Past Surgical History  Procedure Laterality Date  . Knee surgery      Bilateral  replacement  . Cesarean section      x 2   . Breast surgery      biopsies both breast  . Joint replacement      bilat knee     Social History   Social History  . Marital Status: Single    Spouse Name: N/A  . Number of Children: 2  . Years of Education: N/A   Occupational History  . Disabled     Social History Main Topics  . Smoking status: Never Smoker   . Smokeless tobacco: Never Used  . Alcohol Use: Yes     Comment: socially  . Drug Use: No  . Sexual Activity: Not on file   Other Topics Concern  . Not on file   Social History Narrative   [place] daily     Current Outpatient Prescriptions on File Prior to Visit  Medication Sig Dispense Refill  . adalimumab (HUMIRA) 40 MG/0.8ML  injection Inject 40 mg into the skin once a week. Sundays.    Marland Kitchen albuterol (PROAIR HFA) 108 (90 BASE) MCG/ACT inhaler Inhale 2 puffs into the lungs every 4 (four) hours as needed for wheezing or shortness of breath.    Marland Kitchen albuterol (PROVENTIL) (2.5 MG/3ML) 0.083% nebulizer solution Take 2.5 mg by nebulization every 4 (four) hours as needed for wheezing or shortness of breath.     Marland Kitchen amLODipine (NORVASC) 5 MG tablet Take 1 tablet (5 mg total) by mouth daily. 30 tablet 0  . aspirin EC 81 MG tablet Take 81 mg by mouth every morning.    . Calcium Carbonate-Vitamin D 600-400 MG-UNIT per tablet Take 1 tablet by mouth daily.    . ergocalciferol (VITAMIN D2) 50000 UNITS capsule Take 50,000 Units by mouth once a week. Take on Wednesdays    . esomeprazole (NEXIUM) 40 MG capsule Take 40 mg by mouth at bedtime.    . folic acid (FOLVITE) 1 MG tablet Take 1 mg by mouth every morning.     . gabapentin (NEURONTIN) 300 MG capsule Take 300 mg by mouth 3 (three) times daily.  5  . losartan (COZAAR) 50 MG tablet Take by mouth 2 (two) times daily. 100 mg in the morning 50 mg at bedtime.    . lovastatin (MEVACOR) 20 MG tablet Take 2 tablets (40 mg total) by mouth daily at 6 PM. 30 tablet 0  . metFORMIN (GLUCOPHAGE) 1000 MG tablet Take 1,000 mg by mouth 2 (two) times daily with a meal.     . methotrexate (RHEUMATREX) 2.5 MG tablet Take 25 mg by mouth once a week. Caution:Chemotherapy. Protect from light.  Taken on Wednesdays.    . zoledronic acid (RECLAST) 5 MG/100ML SOLN Inject 5 mg into the vein. Received yearly dose 06/11/13     No current facility-administered medications on file prior to visit.    Allergies  Allergen Reactions  . Lisinopril Cough    Family History  Problem Relation Age of Onset  . Colon cancer Neg Hx   . Esophageal cancer Neg Hx   . Stomach cancer Neg Hx   . Rectal cancer Neg Hx   . Diabetes      maternal aunts and uncles  . Diabetes Father   . Liver disease Father   . Emphysema Father       smoked  . Diabetes Sister   . Diabetes Brother   . Sarcoidosis Brother   . Emphysema Mother     smoked  . Asthma Mother   . Asthma Brother     BP 142/76 mmHg  Pulse 77  Temp(Src) 98.2 F (36.8 C) (Oral)  Wt 181 lb 2 oz (82.158 kg)  SpO2 95%  Review of Systems She denies hypoglycemia    Objective:   Physical Exam VITAL SIGNS:  See vs page GENERAL: no distress Pulses: dorsalis pedis intact bilat.   MSK: no deformity of the feet CV: no leg edema Skin:  no ulcer on the feet.  normal color and temp on the feet. Neuro: sensation is intact to touch on the feet, but decreased from normal  Lab Results  Component Value Date   HGBA1C 6.9 02/16/2016      Assessment & Plan:  DM: slightly overcontrolled.    Patient is advised the following: Patient Instructions  check your blood sugar twice a day.  vary the time of day when you check, between before the 3 meals, and at bedtime.  also check if you have symptoms of your blood sugar being too high or too low.  please keep a record of the readings and bring it to your next appointment here (or you can bring the meter itself).  You can write it on any piece of paper.  please call us sooner if your blood sugar goes below 70, or if you have a lot of readings over 200.   Please reduce the lantus to 55 units every morning, and:  Please continue the same metformin, because it may be helping your gastroparesis.  On this type of insulin schedule, you should eat meals on a regular schedule.  If a meal is missed or significantly delayed, your blood sugar could go low.   Please come back for a follow-up appointment in 4 months.

## 2016-06-21 ENCOUNTER — Encounter: Payer: Self-pay | Admitting: Endocrinology

## 2016-06-21 ENCOUNTER — Ambulatory Visit (INDEPENDENT_AMBULATORY_CARE_PROVIDER_SITE_OTHER): Payer: Medicare PPO | Admitting: Endocrinology

## 2016-06-21 VITALS — BP 134/80 | HR 89 | Ht 60.0 in | Wt 183.0 lb

## 2016-06-21 DIAGNOSIS — E1165 Type 2 diabetes mellitus with hyperglycemia: Secondary | ICD-10-CM | POA: Diagnosis not present

## 2016-06-21 DIAGNOSIS — IMO0001 Reserved for inherently not codable concepts without codable children: Secondary | ICD-10-CM

## 2016-06-21 LAB — POCT GLYCOSYLATED HEMOGLOBIN (HGB A1C): Hemoglobin A1C: 7.5

## 2016-06-21 MED ORDER — INSULIN GLARGINE 100 UNIT/ML ~~LOC~~ SOLN: 50.0000 [IU] | SUBCUTANEOUS | 11 refills | Status: DC

## 2016-06-21 NOTE — Patient Instructions (Addendum)
check your blood sugar twice a day.  vary the time of day when you check, between before the 3 meals, and at bedtime.  also check if you have symptoms of your blood sugar being too high or too low.  please keep a record of the readings and bring it to your next appointment here (or you can bring the meter itself).  You can write it on any piece of paper.  please call us sooner if your blood sugar goes below 70, or if you have a lot of readings over 200.   Please continue the same insulin.   Please continue the same metformin, because it may be helping your gastroparesis.  On this type of insulin schedule, you should eat meals on a regular schedule.  If a meal is missed or significantly delayed, your blood sugar could go low.   Please come back for a follow-up appointment in 4 months.

## 2016-06-21 NOTE — Progress Notes (Signed)
Subjective:    Patient ID: Carrie [name], female    DOB: Feb 23, 1956, 60 y.o.   MRN: [id number]  HPI Pt returns for f/u of diabetes mellitus: DM type: Insulin-requiring type 2 Dx'ed: 2010. Complications: painful polyneuropathy and gastroparesis.  Therapy: insulin since 2011.  GDM: 2000.  DKA: never. Severe hypoglycemia: never.  Pancreatitis: never.  Other: she did not tolerate invokana (vaginitis); she was on multiple daily injections in the past, but could not remember to take.  Interval history: no cbg record, but states cbg's are well-controlled.  She takes 50 units qam.  pt states she feels well in general.  Past Medical History:  Diagnosis Date  . Anxiety   . Arthritis    psoriatic arithritis, DDD  . Asthma   . Depression   . Diabetes mellitus   . Gastritis   . Gastroparesis   . GERD (gastroesophageal reflux disease)   . Hernia   . Hyperlipemia   . Hyperlipemia   . Hypertension   . Neuropathy (HCC)    Bil feet re: to Diabetes  . Obesity   . Osteoporosis   . Plantar fasciitis   . Stricture and stenosis of esophagus   . TIA (transient ischemic attack)   . Tuberculosis    tested positive 2011, no symptoms, was on medicine for 6 months    Past Surgical History:  Procedure Laterality Date  . BREAST SURGERY     biopsies both breast  . CESAREAN SECTION     x 2   . JOINT REPLACEMENT     bilat knee   . KNEE SURGERY     Bilateral  replacement    Social History   Social History  . Marital status: Single    Spouse name: N/A  . Number of children: 2  . Years of education: N/A   Occupational History  . Disabled     Social History Main Topics  . Smoking status: Never Smoker  . Smokeless tobacco: Never Used  . Alcohol use Yes     Comment: socially  . Drug use: No  . Sexual activity: Not on file   Other Topics Concern  . Not on file   Social History Narrative   [place] daily     Current Outpatient Prescriptions on File Prior to Visit  Medication  Sig Dispense Refill  . adalimumab (HUMIRA) 40 MG/0.8ML injection Inject 40 mg into the skin once a week. Sundays.    Marland Kitchen albuterol (PROAIR HFA) 108 (90 BASE) MCG/ACT inhaler Inhale 2 puffs into the lungs every 4 (four) hours as needed for wheezing or shortness of breath.    Marland Kitchen albuterol (PROVENTIL) (2.5 MG/3ML) 0.083% nebulizer solution Take 2.5 mg by nebulization every 4 (four) hours as needed for wheezing or shortness of breath.     Marland Kitchen amLODipine (NORVASC) 5 MG tablet Take 1 tablet (5 mg total) by mouth daily. 30 tablet 0  . aspirin EC 81 MG tablet Take 81 mg by mouth every morning.    . Calcium Carbonate-Vitamin D 600-400 MG-UNIT per tablet Take 1 tablet by mouth daily.    . ergocalciferol (VITAMIN D2) 50000 UNITS capsule Take 50,000 Units by mouth once a week. Take on Wednesdays    . esomeprazole (NEXIUM) 40 MG capsule Take 40 mg by mouth at bedtime.    . folic acid (FOLVITE) 1 MG tablet Take 1 mg by mouth every morning.     . gabapentin (NEURONTIN) 300 MG capsule Take 300 mg by mouth 6 (  six) times daily.   5  . losartan (COZAAR) 50 MG tablet Take by mouth 2 (two) times daily. 100 mg in the morning 50 mg at bedtime.    . lovastatin (MEVACOR) 20 MG tablet Take 2 tablets (40 mg total) by mouth daily at 6 PM. 30 tablet 0  . metFORMIN (GLUCOPHAGE) 1000 MG tablet Take 1,000 mg by mouth 2 (two) times daily with a meal.     . methotrexate (RHEUMATREX) 2.5 MG tablet Take 25 mg by mouth once a week. Caution:Chemotherapy. Protect from light.  Taken on Wednesdays.    . zoledronic acid (RECLAST) 5 MG/100ML SOLN Inject 5 mg into the vein. Received yearly dose 06/11/13     No current facility-administered medications on file prior to visit.     Allergies  Allergen Reactions  . Lisinopril Cough    Family History  Problem Relation Age of Onset  . Colon cancer Neg Hx   . Esophageal cancer Neg Hx   . Stomach cancer Neg Hx   . Rectal cancer Neg Hx   . Diabetes      maternal aunts and uncles  . Diabetes  Father   . Liver disease Father   . Emphysema Father     smoked  . Diabetes Sister   . Diabetes Brother   . Sarcoidosis Brother   . Emphysema Mother     smoked  . Asthma Mother   . Asthma Brother    BP 134/80   Pulse 89   Ht 5' (1.524 m)   Wt 183 lb (83 kg)   SpO2 94%   BMI 35.74 kg/m   Review of Systems She denies hypoglycemia.      Objective:   Physical Exam VITAL SIGNS:  See vs page GENERAL: no distress.   Pulses: dorsalis pedis intact bilat.   MSK: no deformity of the feet.  CV: no leg edema.   Skin:  no ulcer on the feet.  normal color and temp on the feet. Neuro: sensation is intact to touch on the feet, but decreased from normal  Lab Results  Component Value Date   HGBA1C 7.5 06/21/2016      Assessment & Plan:  Insulin-requiring type 2 DM: this is the best control this pt should aim for, given this regimen, which does match insulin to her changing needs throughout the day Gastroparesis: metformin may be helping this.

## 2016-10-04 ENCOUNTER — Encounter (HOSPITAL_COMMUNITY): Payer: Self-pay

## 2016-10-04 ENCOUNTER — Ambulatory Visit (HOSPITAL_COMMUNITY)
Admission: RE | Admit: 2016-10-04 | Discharge: 2016-10-04 | Disposition: A | Discharge status: Home / Self Care | Payer: Medicare PPO | Source: Ambulatory Visit | Attending: Physician Assistant | Admitting: Physician Assistant

## 2016-10-04 DIAGNOSIS — M810 Age-related osteoporosis without current pathological fracture: Secondary | ICD-10-CM | POA: Insufficient documentation

## 2016-10-04 MED ORDER — ZOLEDRONIC ACID 5 MG/100ML IV SOLN
5.0000 mg | Freq: Once | INTRAVENOUS | Status: AC
  Administered 2016-10-04: 5 mg via INTRAVENOUS
  Filled 2016-10-04: qty 100

## 2016-10-04 MED ORDER — SODIUM CHLORIDE 0.9 % IV SOLN
INTRAVENOUS | Status: DC
  Administered 2016-10-04: 09:00:00 via INTRAVENOUS

## 2016-10-04 NOTE — Discharge Instructions (Signed)
Zoledronic Acid injection (Paget's Disease, Osteoporosis)/ Infusion What is this medicine? ZOLEDRONIC ACID (ZOE le dron ik AS id) lowers the amount of calcium loss from bone. It is used to treat Paget's disease and osteoporosis in women. COMMON BRAND NAME(S): Reclast, Zometa What should I tell my health care provider before I take this medicine? They need to know if you have any of these conditions: -aspirin-sensitive asthma -cancer, especially if you are receiving medicines used to treat cancer -dental disease or wear dentures -infection -kidney disease -low levels of calcium in the blood -past surgery on the parathyroid gland or intestines -receiving corticosteroids like dexamethasone or prednisone -an unusual or allergic reaction to zoledronic acid, other medicines, foods, dyes, or preservatives -pregnant or trying to get pregnant -breast-feeding How should I use this medicine? This medicine is for infusion into a vein. It is given by a health care professional in a hospital or clinic setting. Talk to your pediatrician regarding the use of this medicine in children. This medicine is not approved for use in children. What if I miss a dose? It is important not to miss your dose. Call your doctor or health care professional if you are unable to keep an appointment. What may interact with this medicine? -certain antibiotics given by injection -NSAIDs, medicines for pain and inflammation, like ibuprofen or naproxen -some diuretics like bumetanide, furosemide -teriparatide What should I watch for while using this medicine? Visit your doctor or health care professional for regular checkups. It may be some time before you see the benefit from this medicine. Do not stop taking your medicine unless your doctor tells you to. Your doctor may order blood tests or other tests to see how you are doing. Women should inform their doctor if they wish to become pregnant or think they might be pregnant.  There is a potential for serious side effects to an unborn child. Talk to your health care professional or pharmacist for more information. You should make sure that you get enough calcium and vitamin D while you are taking this medicine. Discuss the foods you eat and the vitamins you take with your health care professional. Some people who take this medicine have severe bone, joint, and/or muscle pain. This medicine may also increase your risk for jaw problems or a broken thigh bone. Tell your doctor right away if you have severe pain in your jaw, bones, joints, or muscles. Tell your doctor if you have any pain that does not go away or that gets worse. Tell your dentist and dental surgeon that you are taking this medicine. You should not have major dental surgery while on this medicine. See your dentist to have a dental exam and fix any dental problems before starting this medicine. Take good care of your teeth while on this medicine. Make sure you see your dentist for regular follow-up appointments. What side effects may I notice from receiving this medicine? Side effects that you should report to your doctor or health care professional as soon as possible: -allergic reactions like skin rash, itching or hives, swelling of the face, lips, or tongue -anxiety, confusion, or depression -breathing problems -changes in vision -eye pain -feeling faint or lightheaded, falls -jaw pain, especially after dental work -mouth sores -muscle cramps, stiffness, or weakness -redness, blistering, peeling or loosening of the skin, including inside the mouth -trouble passing urine or change in the amount of urine Side effects that usually do not require medical attention (report to your doctor or health care professional  if they continue or are bothersome): -bone, joint, or muscle pain -constipation -diarrhea -fever -hair loss -irritation at site where injected -loss of appetite -nausea, vomiting -stomach  upset -trouble sleeping -trouble swallowing -weak or tired Where should I keep my medicine? This drug is given in a hospital or clinic and will not be stored at home.  2017 [employer]/Gold Standard (2014-02-27 14:19:57)

## 2016-10-18 DIAGNOSIS — G3184 Mild cognitive impairment, so stated: Secondary | ICD-10-CM | POA: Diagnosis present

## 2016-10-21 NOTE — Progress Notes (Signed)
Subjective:    Patient ID: Carrie [name], female    DOB: 01/05/1956, 61 y.o.   MRN: [id number]  HPI Pt returns for f/u of diabetes mellitus: DM type: Insulin-requiring type 2 Dx'ed: 2010. Complications: painful polyneuropathy and gastroparesis.  Therapy: insulin since 2011.  GDM: 2000.  DKA: never. Severe hypoglycemia: never.  Pancreatitis: never.  Other: she did not tolerate invokana (vaginitis); she was on multiple daily injections in the past, but could not remember to take.  Interval history: no cbg record, but states cbg's are vary from 120-200.  There is no trend throughout the day.  She takes 50 units qam.  pt states she feels well in general.   Past Medical History:  Diagnosis Date  . Anxiety   . Arthritis    psoriatic arithritis, DDD  . Asthma   . Depression   . Diabetes mellitus   . Gastritis   . Gastroparesis   . GERD (gastroesophageal reflux disease)   . Hernia   . Hyperlipemia   . Hyperlipemia   . Hypertension   . Neuropathy (HCC)    Bil feet re: to Diabetes  . Obesity   . Osteoporosis   . Plantar fasciitis   . Stricture and stenosis of esophagus   . TIA (transient ischemic attack)   . Tuberculosis    tested positive 2011, no symptoms, was on medicine for 6 months    Past Surgical History:  Procedure Laterality Date  . BREAST SURGERY     biopsies both breast  . CESAREAN SECTION     x 2   . JOINT REPLACEMENT     bilat knee   . KNEE SURGERY     Bilateral  replacement    Social History   Social History  . Marital status: Single    Spouse name: N/A  . Number of children: 2  . Years of education: N/A   Occupational History  . Disabled     Social History Main Topics  . Smoking status: Never Smoker  . Smokeless tobacco: Never Used  . Alcohol use Yes     Comment: socially  . Drug use: No  . Sexual activity: Not on file   Other Topics Concern  . Not on file   Social History Narrative   [place] daily     Current Outpatient  Prescriptions on File Prior to Visit  Medication Sig Dispense Refill  . adalimumab (HUMIRA) 40 MG/0.8ML injection Inject 40 mg into the skin once a week. Sundays.    Marland Kitchen albuterol (PROAIR HFA) 108 (90 BASE) MCG/ACT inhaler Inhale 2 puffs into the lungs every 4 (four) hours as needed for wheezing or shortness of breath.    Marland Kitchen albuterol (PROVENTIL) (2.5 MG/3ML) 0.083% nebulizer solution Take 2.5 mg by nebulization every 4 (four) hours as needed for wheezing or shortness of breath.     Marland Kitchen amLODipine (NORVASC) 5 MG tablet Take 1 tablet (5 mg total) by mouth daily. 30 tablet 0  . aspirin EC 81 MG tablet Take 81 mg by mouth every morning.    Marland Kitchen buPROPion (WELLBUTRIN) 75 MG tablet Take 75 mg by mouth 3 (three) times daily.     . Calcium Carbonate-Vitamin D 600-400 MG-UNIT per tablet Take 1 tablet by mouth daily.    . ergocalciferol (VITAMIN D2) 50000 UNITS capsule Take 50,000 Units by mouth once a week. Take on Wednesdays    . esomeprazole (NEXIUM) 40 MG capsule Take 40 mg by mouth at bedtime.    Marland Kitchen  folic acid (FOLVITE) 1 MG tablet Take 1 mg by mouth every morning.     . gabapentin (NEURONTIN) 300 MG capsule Take 300 mg by mouth 6 (six) times daily.   5  . losartan (COZAAR) 50 MG tablet Take by mouth 2 (two) times daily. 100 mg in the morning 50 mg at bedtime.    . lovastatin (MEVACOR) 20 MG tablet Take 2 tablets (40 mg total) by mouth daily at 6 PM. 30 tablet 0  . metFORMIN (GLUCOPHAGE) 1000 MG tablet Take 1,000 mg by mouth 2 (two) times daily with a meal.     . methotrexate (RHEUMATREX) 2.5 MG tablet Take 25 mg by mouth once a week. Caution:Chemotherapy. Protect from light.  Taken on Wednesdays.    . zoledronic acid (RECLAST) 5 MG/100ML SOLN Inject 5 mg into the vein. Received yearly dose 06/11/13     No current facility-administered medications on file prior to visit.     Allergies  Allergen Reactions  . Lisinopril Cough    Family History  Problem Relation Age of Onset  . Diabetes      maternal  aunts and uncles  . Diabetes Father   . Liver disease Father   . Emphysema Father     smoked  . Diabetes Sister   . Diabetes Brother   . Sarcoidosis Brother   . Emphysema Mother     smoked  . Asthma Mother   . Asthma Brother   . Colon cancer Neg Hx   . Esophageal cancer Neg Hx   . Stomach cancer Neg Hx   . Rectal cancer Neg Hx     BP 132/82   Pulse 92   Ht 5' (1.524 m)   Wt 181 lb (82.1 kg)   SpO2 96%   BMI 35.35 kg/m   Review of Systems He denies hypoglycemia.      Objective:   Physical Exam VITAL SIGNS:  See vs page GENERAL: no distress.   Pulses: dorsalis pedis intact bilat.   MSK: no deformity of the feet.  CV: no leg edema.   Skin:  no ulcer on the feet, but the skin is dry.  normal color and temp on the feet. Neuro: sensation is intact to touch on the feet, but decreased from normal.   Lab Results  Component Value Date   HGBA1C 7.9 10/23/2016      Assessment & Plan:  Insulin-requiring type 2 DM, with polyneuropathy: she needs slightly increased rx.   Patient is advised the following: Patient Instructions  check your blood sugar twice a day.  vary the time of day when you check, between before the 3 meals, and at bedtime.  also check if you have symptoms of your blood sugar being too high or too low.  please keep a record of the readings and bring it to your next appointment here (or you can bring the meter itself).  You can write it on any piece of paper.  please call us sooner if your blood sugar goes below 70, or if you have a lot of readings over 200.   Please increase the insulin to 52 units each morning. Please continue the same metformin, because it may be helping your gastroparesis.  On this type of insulin schedule, you should eat meals on a regular schedule.  If a meal is missed or significantly delayed, your blood sugar could go low.   Please come back for a follow-up appointment in 4 months.

## 2016-10-22 ENCOUNTER — Ambulatory Visit: Payer: Self-pay | Admitting: Endocrinology

## 2016-10-23 ENCOUNTER — Ambulatory Visit (INDEPENDENT_AMBULATORY_CARE_PROVIDER_SITE_OTHER): Payer: Medicare PPO | Admitting: Endocrinology

## 2016-10-23 ENCOUNTER — Encounter: Payer: Self-pay | Admitting: Endocrinology

## 2016-10-23 VITALS — BP 132/82 | HR 92 | Ht 60.0 in | Wt 181.0 lb

## 2016-10-23 DIAGNOSIS — E1165 Type 2 diabetes mellitus with hyperglycemia: Secondary | ICD-10-CM

## 2016-10-23 DIAGNOSIS — IMO0001 Reserved for inherently not codable concepts without codable children: Secondary | ICD-10-CM

## 2016-10-23 LAB — POCT GLYCOSYLATED HEMOGLOBIN (HGB A1C): HEMOGLOBIN A1C: 7.9

## 2016-10-23 MED ORDER — INSULIN GLARGINE 100 UNIT/ML ~~LOC~~ SOLN: 52.0000 [IU] | SUBCUTANEOUS | 11 refills | Status: DC

## 2016-10-23 NOTE — Patient Instructions (Addendum)
check your blood sugar twice a day.  vary the time of day when you check, between before the 3 meals, and at bedtime.  also check if you have symptoms of your blood sugar being too high or too low.  please keep a record of the readings and bring it to your next appointment here (or you can bring the meter itself).  You can write it on any piece of paper.  please call us sooner if your blood sugar goes below 70, or if you have a lot of readings over 200.   Please increase the insulin to 52 units each morning. Please continue the same metformin, because it may be helping your gastroparesis.  On this type of insulin schedule, you should eat meals on a regular schedule.  If a meal is missed or significantly delayed, your blood sugar could go low.   Please come back for a follow-up appointment in 4 months.

## 2016-11-02 LAB — HM DIABETES EYE EXAM

## 2017-02-21 ENCOUNTER — Ambulatory Visit: Payer: Medicare PPO | Admitting: Endocrinology

## 2017-03-07 ENCOUNTER — Ambulatory Visit (INDEPENDENT_AMBULATORY_CARE_PROVIDER_SITE_OTHER): Payer: Medicare PPO | Admitting: Endocrinology

## 2017-03-07 ENCOUNTER — Encounter: Payer: Self-pay | Admitting: Endocrinology

## 2017-03-07 VITALS — BP 132/86 | HR 110 | Ht 60.0 in | Wt 173.0 lb

## 2017-03-07 DIAGNOSIS — E1165 Type 2 diabetes mellitus with hyperglycemia: Secondary | ICD-10-CM

## 2017-03-07 DIAGNOSIS — IMO0001 Reserved for inherently not codable concepts without codable children: Secondary | ICD-10-CM

## 2017-03-07 LAB — POCT GLYCOSYLATED HEMOGLOBIN (HGB A1C): Hemoglobin A1C: 7.4

## 2017-03-07 MED ORDER — METFORMIN HCL ER (MOD) 1000 MG PO TB24: 1000.0000 mg | ORAL_TABLET | Freq: Every day | ORAL | 3 refills | Status: DC

## 2017-03-07 MED ORDER — INSULIN GLARGINE 100 UNIT/ML ~~LOC~~ SOLN: 57.0000 [IU] | SUBCUTANEOUS | 11 refills | Status: DC

## 2017-03-07 NOTE — Patient Instructions (Addendum)
check your blood sugar twice a day.  vary the time of day when you check, between before the 3 meals, and at bedtime.  also check if you have symptoms of your blood sugar being too high or too low.  please keep a record of the readings and bring it to your next appointment here (or you can bring the meter itself).  You can write it on any piece of paper.  please call us sooner if your blood sugar goes below 70, or if you have a lot of readings over 200.   Please continue the same insulin, and: Reduce the metformin to 1000 mg daily On this type of insulin schedule, you should eat meals on a regular schedule.  If a meal is missed or significantly delayed, your blood sugar could go low.   Please come back for a follow-up appointment in 4 months.

## 2017-03-07 NOTE — Progress Notes (Signed)
Subjective:    Patient ID: Carrie [name], female    DOB: 1956-09-07, 61 y.o.   MRN: [id number]  HPI Pt returns for f/u of diabetes mellitus: DM type: Insulin-requiring type 2 Dx'ed: 2010. Complications: painful polyneuropathy and gastroparesis.  Therapy: insulin since 2011.  GDM: 2000.  DKA: never. Severe hypoglycemia: never.  Pancreatitis: never.  Other: she did not tolerate invokana (vaginitis); she was on multiple daily injections in the past, but could not remember to take.  Interval history: no cbg record, but states cbg's are in the 100's.  There is no trend throughout the day.  She takes 57 units qam.  pt states she feels well in general, except for chronic nausea.    Past Medical History:  Diagnosis Date  . Anxiety   . Arthritis    psoriatic arithritis, DDD  . Asthma   . Depression   . Diabetes mellitus   . Gastritis   . Gastroparesis   . GERD (gastroesophageal reflux disease)   . Hernia   . Hyperlipemia   . Hyperlipemia   . Hypertension   . Neuropathy    Bil feet re: to Diabetes  . Obesity   . Osteoporosis   . Plantar fasciitis   . Stricture and stenosis of esophagus   . TIA (transient ischemic attack)   . Tuberculosis    tested positive 2011, no symptoms, was on medicine for 6 months    Past Surgical History:  Procedure Laterality Date  . BREAST SURGERY     biopsies both breast  . CESAREAN SECTION     x 2   . JOINT REPLACEMENT     bilat knee   . KNEE SURGERY     Bilateral  replacement    Social History   Social History  . Marital status: Single    Spouse name: N/A  . Number of children: 2  . Years of education: N/A   Occupational History  . Disabled     Social History Main Topics  . Smoking status: Never Smoker  . Smokeless tobacco: Never Used  . Alcohol use Yes     Comment: socially  . Drug use: No  . Sexual activity: Not on file   Other Topics Concern  . Not on file   Social History Narrative   [place] daily     Current  Outpatient Prescriptions on File Prior to Visit  Medication Sig Dispense Refill  . adalimumab (HUMIRA) 40 MG/0.8ML injection Inject 40 mg into the skin once a week. Sundays.    Marland Kitchen albuterol (PROAIR HFA) 108 (90 BASE) MCG/ACT inhaler Inhale 2 puffs into the lungs every 4 (four) hours as needed for wheezing or shortness of breath.    Marland Kitchen albuterol (PROVENTIL) (2.5 MG/3ML) 0.083% nebulizer solution Take 2.5 mg by nebulization every 4 (four) hours as needed for wheezing or shortness of breath.     Marland Kitchen amLODipine (NORVASC) 5 MG tablet Take 1 tablet (5 mg total) by mouth daily. 30 tablet 0  . aspirin EC 81 MG tablet Take 81 mg by mouth every morning.    Marland Kitchen buPROPion (WELLBUTRIN) 75 MG tablet Take 75 mg by mouth 3 (three) times daily.     . Calcium Carbonate-Vitamin D 600-400 MG-UNIT per tablet Take 1 tablet by mouth daily.    . ergocalciferol (VITAMIN D2) 50000 UNITS capsule Take 50,000 Units by mouth once a week. Take on Wednesdays    . esomeprazole (NEXIUM) 40 MG capsule Take 40 mg by mouth at  bedtime.    . folic acid (FOLVITE) 1 MG tablet Take 1 mg by mouth every morning.     . gabapentin (NEURONTIN) 300 MG capsule Take 300 mg by mouth 6 (six) times daily.   5  . losartan (COZAAR) 50 MG tablet Take by mouth 2 (two) times daily. 100 mg in the morning 50 mg at bedtime.    . lovastatin (MEVACOR) 20 MG tablet Take 2 tablets (40 mg total) by mouth daily at 6 PM. 30 tablet 0  . memantine (NAMENDA) 5 MG tablet Take 5 mg by mouth 2 (two) times daily.    . methotrexate (RHEUMATREX) 2.5 MG tablet Take 25 mg by mouth once a week. Caution:Chemotherapy. Protect from light.  Taken on Wednesdays.    . primidone (MYSOLINE) 50 MG tablet Take by mouth 4 (four) times daily.    . zoledronic acid (RECLAST) 5 MG/100ML SOLN Inject 5 mg into the vein. Received yearly dose 06/11/13     No current facility-administered medications on file prior to visit.     Allergies  Allergen Reactions  . Lisinopril Cough    Family  History  Problem Relation Age of Onset  . Diabetes Unknown        maternal aunts and uncles  . Diabetes Father   . Liver disease Father   . Emphysema Father        smoked  . Diabetes Sister   . Diabetes Brother   . Sarcoidosis Brother   . Emphysema Mother        smoked  . Asthma Mother   . Asthma Brother   . Colon cancer Neg Hx   . Esophageal cancer Neg Hx   . Stomach cancer Neg Hx   . Rectal cancer Neg Hx     BP 132/86   Pulse (!) 110   Ht 5' (1.524 m)   Wt 173 lb (78.5 kg)   SpO2 97%   BMI 33.79 kg/m    Review of Systems She denies hypoglycemia    Objective:   Physical Exam VITAL SIGNS:  See vs page GENERAL: no distress.   Pulses: dorsalis pedis intact bilat.   MSK: no deformity of the feet.  CV: no leg edema.   Skin:  no ulcer on the feet, but the skin is dry.  normal color and temp on the feet.  Erythema is noted on both legs (being rx'ed for cellulitis).  Neuro: sensation is intact to touch on the feet, but decreased from normal.    Lab Results  Component Value Date   HGBA1C 7.4 03/07/2017      Assessment & Plan:  Insulin-requiring type 2 DM, with polyneuropathy: this is the best control this pt should aim for, given this regimen, which does match insulin to her changing needs throughout the day Gastroparesis: this limits med rx options. Nausea: this could be due to gastroparesis or metformin or both.  The metformin could be helping gastroparesis.  However, because of nausea, we should reduce now.     Patient Instructions  check your blood sugar twice a day.  vary the time of day when you check, between before the 3 meals, and at bedtime.  also check if you have symptoms of your blood sugar being too high or too low.  please keep a record of the readings and bring it to your next appointment here (or you can bring the meter itself).  You can write it on any piece of paper.  please call us  sooner if your blood sugar goes below 70, or if you have a lot of  readings over 200.   Please continue the same insulin, and: Reduce the metformin to 1000 mg daily On this type of insulin schedule, you should eat meals on a regular schedule.  If a meal is missed or significantly delayed, your blood sugar could go low.   Please come back for a follow-up appointment in 4 months.

## 2017-03-11 ENCOUNTER — Other Ambulatory Visit: Payer: Self-pay | Admitting: Endocrinology

## 2017-06-19 DIAGNOSIS — J0100 Acute maxillary sinusitis, unspecified: Secondary | ICD-10-CM | POA: Diagnosis not present

## 2017-06-19 DIAGNOSIS — Z7982 Long term (current) use of aspirin: Secondary | ICD-10-CM | POA: Diagnosis not present

## 2017-06-19 DIAGNOSIS — N390 Urinary tract infection, site not specified: Secondary | ICD-10-CM | POA: Diagnosis not present

## 2017-06-24 DIAGNOSIS — Z6833 Body mass index (BMI) 33.0-33.9, adult: Secondary | ICD-10-CM | POA: Diagnosis not present

## 2017-06-24 DIAGNOSIS — Z79899 Other long term (current) drug therapy: Secondary | ICD-10-CM | POA: Diagnosis not present

## 2017-06-24 DIAGNOSIS — R251 Tremor, unspecified: Secondary | ICD-10-CM | POA: Diagnosis not present

## 2017-06-24 DIAGNOSIS — R3915 Urgency of urination: Secondary | ICD-10-CM | POA: Diagnosis not present

## 2017-06-24 DIAGNOSIS — L408 Other psoriasis: Secondary | ICD-10-CM | POA: Diagnosis not present

## 2017-06-24 DIAGNOSIS — L4050 Arthropathic psoriasis, unspecified: Secondary | ICD-10-CM | POA: Diagnosis not present

## 2017-06-24 DIAGNOSIS — M2550 Pain in unspecified joint: Secondary | ICD-10-CM | POA: Diagnosis not present

## 2017-06-24 DIAGNOSIS — E669 Obesity, unspecified: Secondary | ICD-10-CM | POA: Diagnosis not present

## 2017-06-24 DIAGNOSIS — M545 Low back pain: Secondary | ICD-10-CM | POA: Diagnosis not present

## 2017-07-04 DIAGNOSIS — K219 Gastro-esophageal reflux disease without esophagitis: Secondary | ICD-10-CM | POA: Diagnosis not present

## 2017-07-04 DIAGNOSIS — Z794 Long term (current) use of insulin: Secondary | ICD-10-CM | POA: Diagnosis not present

## 2017-07-04 DIAGNOSIS — I10 Essential (primary) hypertension: Secondary | ICD-10-CM | POA: Diagnosis not present

## 2017-07-04 DIAGNOSIS — E1149 Type 2 diabetes mellitus with other diabetic neurological complication: Secondary | ICD-10-CM | POA: Diagnosis not present

## 2017-07-04 DIAGNOSIS — E7800 Pure hypercholesterolemia, unspecified: Secondary | ICD-10-CM | POA: Diagnosis not present

## 2017-07-04 DIAGNOSIS — Z87440 Personal history of urinary (tract) infections: Secondary | ICD-10-CM | POA: Diagnosis not present

## 2017-07-04 DIAGNOSIS — F411 Generalized anxiety disorder: Secondary | ICD-10-CM | POA: Diagnosis not present

## 2017-07-04 DIAGNOSIS — E559 Vitamin D deficiency, unspecified: Secondary | ICD-10-CM | POA: Diagnosis not present

## 2017-07-04 DIAGNOSIS — Z79899 Other long term (current) drug therapy: Secondary | ICD-10-CM | POA: Diagnosis not present

## 2017-07-08 ENCOUNTER — Encounter: Payer: Self-pay | Admitting: Endocrinology

## 2017-07-08 ENCOUNTER — Ambulatory Visit (INDEPENDENT_AMBULATORY_CARE_PROVIDER_SITE_OTHER): Payer: Medicare PPO | Admitting: Endocrinology

## 2017-07-08 VITALS — BP 142/78 | HR 94 | Wt 173.0 lb

## 2017-07-08 DIAGNOSIS — IMO0001 Reserved for inherently not codable concepts without codable children: Secondary | ICD-10-CM

## 2017-07-08 DIAGNOSIS — E1165 Type 2 diabetes mellitus with hyperglycemia: Secondary | ICD-10-CM

## 2017-07-08 DIAGNOSIS — L4050 Arthropathic psoriasis, unspecified: Secondary | ICD-10-CM | POA: Insufficient documentation

## 2017-07-08 DIAGNOSIS — L409 Psoriasis, unspecified: Secondary | ICD-10-CM | POA: Diagnosis not present

## 2017-07-08 LAB — POCT GLYCOSYLATED HEMOGLOBIN (HGB A1C): HEMOGLOBIN A1C: 7.5

## 2017-07-08 MED ORDER — INSULIN GLARGINE 100 UNIT/ML ~~LOC~~ SOLN: 50.0000 [IU] | SUBCUTANEOUS | 11 refills | Status: DC

## 2017-07-08 NOTE — Patient Instructions (Signed)
check your blood sugar twice a day.  vary the time of day when you check, between before the 3 meals, and at bedtime.  also check if you have symptoms of your blood sugar being too high or too low.  please keep a record of the readings and bring it to your next appointment here (or you can bring the meter itself).  You can write it on any piece of paper.  please call us sooner if your blood sugar goes below 70, or if you have a lot of readings over 200.   Please continue the same meds for diabetes.  On this type of insulin schedule, you should eat meals on a regular schedule.  If a meal is missed or significantly delayed, your blood sugar could go low.   Please come back for a follow-up appointment in 4 months.

## 2017-07-08 NOTE — Progress Notes (Signed)
Subjective:    Patient ID: Carrie [name], female    DOB: 25-Nov-1955, 61 y.o.   MRN: [id number]  HPI Pt returns for f/u of diabetes mellitus: DM type: Insulin-requiring type 2 Dx'ed: 2010. Complications: painful polyneuropathy and gastroparesis.  Therapy: insulin since 2011.  GDM: 2000.  DKA: never. Severe hypoglycemia: never.  Pancreatitis: never.  Other: she did not tolerate invokana (vaginitis); she was on multiple daily injections in the past, but could not remember to take; metformin dosage is limited by nausea; psoriasis raises possibility she is evolving type 1 DM.  Interval history: no cbg record, but states cbg's are well-controlled.  There is no trend throughout the day.  She takes 50 units qam.  pt states she feels well in general.  Past Medical History:  Diagnosis Date  . Anxiety   . Arthritis    psoriatic arithritis, DDD  . Asthma   . Depression   . Diabetes mellitus   . Gastritis   . Gastroparesis   . GERD (gastroesophageal reflux disease)   . Hernia   . Hyperlipemia   . Hyperlipemia   . Hypertension   . Neuropathy    Bil feet re: to Diabetes  . Obesity   . Osteoporosis   . Plantar fasciitis   . Stricture and stenosis of esophagus   . TIA (transient ischemic attack)   . Tuberculosis    tested positive 2011, no symptoms, was on medicine for 6 months    Past Surgical History:  Procedure Laterality Date  . BREAST SURGERY     biopsies both breast  . CESAREAN SECTION     x 2   . JOINT REPLACEMENT     bilat knee   . KNEE SURGERY     Bilateral  replacement    Social History   Social History  . Marital status: Single    Spouse name: N/A  . Number of children: 2  . Years of education: N/A   Occupational History  . Disabled     Social History Main Topics  . Smoking status: Never Smoker  . Smokeless tobacco: Never Used  . Alcohol use Yes     Comment: socially  . Drug use: No  . Sexual activity: Not on file   Other Topics Concern  . Not on  file   Social History Narrative   [place] daily     Current Outpatient Prescriptions on File Prior to Visit  Medication Sig Dispense Refill  . adalimumab (HUMIRA) 40 MG/0.8ML injection Inject 40 mg into the skin once a week. Sundays.    Marland Kitchen albuterol (PROAIR HFA) 108 (90 BASE) MCG/ACT inhaler Inhale 2 puffs into the lungs every 4 (four) hours as needed for wheezing or shortness of breath.    Marland Kitchen albuterol (PROVENTIL) (2.5 MG/3ML) 0.083% nebulizer solution Take 2.5 mg by nebulization every 4 (four) hours as needed for wheezing or shortness of breath.     Marland Kitchen amLODipine (NORVASC) 5 MG tablet Take 1 tablet (5 mg total) by mouth daily. 30 tablet 0  . aspirin EC 81 MG tablet Take 81 mg by mouth every morning.    . Calcium Carbonate-Vitamin D 600-400 MG-UNIT per tablet Take 1 tablet by mouth daily.    . ergocalciferol (VITAMIN D2) 50000 UNITS capsule Take 50,000 Units by mouth once a week. Take on Wednesdays    . esomeprazole (NEXIUM) 40 MG capsule Take 40 mg by mouth at bedtime.    . folic acid (FOLVITE) 1 MG tablet Take  1 mg by mouth every morning.     . gabapentin (NEURONTIN) 300 MG capsule Take 300 mg by mouth 6 (six) times daily.   5  . losartan (COZAAR) 50 MG tablet Take by mouth 2 (two) times daily. 100 mg in the morning 50 mg at bedtime.    . lovastatin (MEVACOR) 20 MG tablet Take 2 tablets (40 mg total) by mouth daily at 6 PM. 30 tablet 0  . memantine (NAMENDA) 5 MG tablet Take 5 mg by mouth 2 (two) times daily.    . metFORMIN (GLUMETZA) 1000 MG (MOD) 24 hr tablet Take 1 tablet (1,000 mg total) by mouth daily with breakfast. 180 tablet 3  . methotrexate (RHEUMATREX) 2.5 MG tablet Take 25 mg by mouth once a week. Caution:Chemotherapy. Protect from light.  Taken on Wednesdays.    Marland Kitchen nystatin cream (MYCOSTATIN) Apply topically.    . primidone (MYSOLINE) 50 MG tablet Take 250 mg by mouth 2 (two) times daily.     . zoledronic acid (RECLAST) 5 MG/100ML SOLN Inject 5 mg into the vein. Received yearly  dose 06/11/13    . buPROPion (WELLBUTRIN) 75 MG tablet Take 75 mg by mouth 3 (three) times daily.      No current facility-administered medications on file prior to visit.     Allergies  Allergen Reactions  . Lisinopril Cough    Family History  Problem Relation Age of Onset  . Diabetes Unknown        maternal aunts and uncles  . Diabetes Father   . Liver disease Father   . Emphysema Father        smoked  . Diabetes Sister   . Diabetes Brother   . Sarcoidosis Brother   . Emphysema Mother        smoked  . Asthma Mother   . Asthma Brother   . Colon cancer Neg Hx   . Esophageal cancer Neg Hx   . Stomach cancer Neg Hx   . Rectal cancer Neg Hx     BP (!) 142/78   Pulse 94   Wt 173 lb (78.5 kg)   SpO2 96%   BMI 33.79 kg/m    Review of Systems She denies hypoglycemia    Objective:   Physical Exam VITAL SIGNS:  See vs page GENERAL: no distress Pulses: foot pulses are intact bilaterally.   MSK: no deformity of the feet or ankles.  CV: no edema of the legs or ankles Skin:  no ulcer on the feet or ankles.  normal color and temp on the feet and ankles Neuro: sensation is intact to touch on the feet and ankles, but decreased from normal.    Lab Results  Component Value Date   HGBA1C 7.5 07/08/2017      Assessment & Plan:  Insulin-requiring type 2 DM, with polyneuropathy: this is the best control this pt should aim for, given this regimen, which does match insulin to her changing needs throughout the day Psoriasis: I told pt this raises the possibility she is evolving type 1 DM.   Patient Instructions  check your blood sugar twice a day.  vary the time of day when you check, between before the 3 meals, and at bedtime.  also check if you have symptoms of your blood sugar being too high or too low.  please keep a record of the readings and bring it to your next appointment here (or you can bring the meter itself).  You can write it on  any piece of paper.  please call us  sooner if your blood sugar goes below 70, or if you have a lot of readings over 200.   Please continue the same meds for diabetes.  On this type of insulin schedule, you should eat meals on a regular schedule.  If a meal is missed or significantly delayed, your blood sugar could go low.   Please come back for a follow-up appointment in 4 months.

## 2017-07-10 DIAGNOSIS — M25562 Pain in left knee: Secondary | ICD-10-CM | POA: Diagnosis not present

## 2017-07-10 DIAGNOSIS — M25512 Pain in left shoulder: Secondary | ICD-10-CM | POA: Diagnosis not present

## 2017-07-22 DIAGNOSIS — M25562 Pain in left knee: Secondary | ICD-10-CM | POA: Diagnosis not present

## 2017-08-27 DIAGNOSIS — Z1231 Encounter for screening mammogram for malignant neoplasm of breast: Secondary | ICD-10-CM | POA: Diagnosis not present

## 2017-09-24 DIAGNOSIS — B370 Candidal stomatitis: Secondary | ICD-10-CM | POA: Diagnosis not present

## 2017-09-24 DIAGNOSIS — B372 Candidiasis of skin and nail: Secondary | ICD-10-CM | POA: Diagnosis not present

## 2017-09-25 DIAGNOSIS — M2550 Pain in unspecified joint: Secondary | ICD-10-CM | POA: Diagnosis not present

## 2017-09-25 DIAGNOSIS — Z79899 Other long term (current) drug therapy: Secondary | ICD-10-CM | POA: Diagnosis not present

## 2017-09-25 DIAGNOSIS — R3915 Urgency of urination: Secondary | ICD-10-CM | POA: Diagnosis not present

## 2017-09-25 DIAGNOSIS — K121 Other forms of stomatitis: Secondary | ICD-10-CM | POA: Diagnosis not present

## 2017-09-25 DIAGNOSIS — L4050 Arthropathic psoriasis, unspecified: Secondary | ICD-10-CM | POA: Diagnosis not present

## 2017-09-25 DIAGNOSIS — L408 Other psoriasis: Secondary | ICD-10-CM | POA: Diagnosis not present

## 2017-09-25 DIAGNOSIS — E669 Obesity, unspecified: Secondary | ICD-10-CM | POA: Diagnosis not present

## 2017-09-25 DIAGNOSIS — R251 Tremor, unspecified: Secondary | ICD-10-CM | POA: Diagnosis not present

## 2017-09-25 DIAGNOSIS — M545 Low back pain: Secondary | ICD-10-CM | POA: Diagnosis not present

## 2017-10-11 DIAGNOSIS — Z79899 Other long term (current) drug therapy: Secondary | ICD-10-CM | POA: Diagnosis not present

## 2017-10-11 DIAGNOSIS — L4050 Arthropathic psoriasis, unspecified: Secondary | ICD-10-CM | POA: Diagnosis not present

## 2017-11-07 ENCOUNTER — Encounter: Payer: Self-pay | Admitting: Endocrinology

## 2017-11-07 ENCOUNTER — Ambulatory Visit (INDEPENDENT_AMBULATORY_CARE_PROVIDER_SITE_OTHER): Payer: Medicare PPO | Admitting: Endocrinology

## 2017-11-07 VITALS — BP 132/80 | HR 83 | Ht 60.0 in | Wt 174.0 lb

## 2017-11-07 DIAGNOSIS — Z794 Long term (current) use of insulin: Secondary | ICD-10-CM

## 2017-11-07 DIAGNOSIS — E1142 Type 2 diabetes mellitus with diabetic polyneuropathy: Secondary | ICD-10-CM | POA: Diagnosis not present

## 2017-11-07 DIAGNOSIS — N183 Chronic kidney disease, stage 3 (moderate): Secondary | ICD-10-CM | POA: Diagnosis not present

## 2017-11-07 DIAGNOSIS — N390 Urinary tract infection, site not specified: Secondary | ICD-10-CM | POA: Diagnosis not present

## 2017-11-07 LAB — POCT GLYCOSYLATED HEMOGLOBIN (HGB A1C): Hemoglobin A1C: 9.7

## 2017-11-07 MED ORDER — METFORMIN HCL ER (MOD) 1000 MG PO TB24: 2000.0000 mg | ORAL_TABLET | Freq: Every day | ORAL | 3 refills | Status: DC

## 2017-11-07 MED ORDER — INSULIN GLARGINE 100 UNIT/ML ~~LOC~~ SOLN: 60.0000 [IU] | SUBCUTANEOUS | 11 refills | Status: DC

## 2017-11-07 NOTE — Progress Notes (Signed)
Subjective:    Patient ID: Carrie [name], female    DOB: [birth year], 62 y.o.   MRN: [id number]  HPI Pt returns for f/u of diabetes mellitus: DM type: Insulin-requiring type 2 Dx'ed: 2010. Complications: painful polyneuropathy and gastroparesis.  Therapy: insulin since 2011.  GDM: 2000.  DKA: never. Severe hypoglycemia: never.  Pancreatitis: never.  Other: she did not tolerate invokana (vaginitis); she was on multiple daily injections in the past, but could not remember to take; metformin dosage is limited by nausea; psoriasis raises possibility she is evolving type 1 DM.  Interval history: no cbg record, but states cbg's are in the low to mid-100's.  There is no trend throughout the day.   pt states she feels well in general.  She never misses the insulin.  Past Medical History:  Diagnosis Date  . Anxiety   . Arthritis    psoriatic arithritis, DDD  . Asthma   . Depression   . Diabetes mellitus   . Gastritis   . Gastroparesis   . GERD (gastroesophageal reflux disease)   . Hernia   . Hyperlipemia   . Hyperlipemia   . Hypertension   . Neuropathy    Bil feet re: to Diabetes  . Obesity   . Osteoporosis   . Plantar fasciitis   . Stricture and stenosis of esophagus   . TIA (transient ischemic attack)   . Tuberculosis    tested positive 2011, no symptoms, was on medicine for 6 months    Past Surgical History:  Procedure Laterality Date  . BREAST SURGERY     biopsies both breast  . CESAREAN SECTION     x 2   . JOINT REPLACEMENT     bilat knee   . KNEE SURGERY     Bilateral  replacement    Social History   Socioeconomic History  . Marital status: Single    Spouse name: Not on file  . Number of children: 2  . Years of education: Not on file  . Highest education level: Not on file  Social Needs  . Financial resource strain: Not on file  . Food insecurity - worry: Not on file  . Food insecurity - inability: Not on file  . Transportation needs - medical: Not on  file  . Transportation needs - non-medical: Not on file  Occupational History  . Occupation: Disabled   Tobacco Use  . Smoking status: Never Smoker  . Smokeless tobacco: Never Used  Substance and Sexual Activity  . Alcohol use: Yes    Comment: socially  . Drug use: No  . Sexual activity: Not on file  Other Topics Concern  . Not on file  Social History Narrative   [place] daily     Current Outpatient Medications on File Prior to Visit  Medication Sig Dispense Refill  . adalimumab (HUMIRA) 40 MG/0.8ML injection Inject 40 mg into the skin once a week. Sundays.    Marland Kitchen albuterol (PROAIR HFA) 108 (90 BASE) MCG/ACT inhaler Inhale 2 puffs into the lungs every 4 (four) hours as needed for wheezing or shortness of breath.    Marland Kitchen albuterol (PROVENTIL) (2.5 MG/3ML) 0.083% nebulizer solution Take 2.5 mg by nebulization every 4 (four) hours as needed for wheezing or shortness of breath.     Marland Kitchen amLODipine (NORVASC) 5 MG tablet Take 1 tablet (5 mg total) by mouth daily. 30 tablet 0  . aspirin EC 81 MG tablet Take 81 mg by mouth every morning.    Marland Kitchen  Calcium Carbonate-Vitamin D 600-400 MG-UNIT per tablet Take 1 tablet by mouth daily.    Marland Kitchen esomeprazole (NEXIUM) 40 MG capsule Take 40 mg by mouth at bedtime.    . folic acid (FOLVITE) 1 MG tablet Take 1 mg by mouth every morning.     . gabapentin (NEURONTIN) 300 MG capsule Take 300 mg by mouth 6 (six) times daily.   5  . losartan (COZAAR) 50 MG tablet Take by mouth 2 (two) times daily. 100 mg in the morning 50 mg at bedtime.    . lovastatin (MEVACOR) 20 MG tablet Take 2 tablets (40 mg total) by mouth daily at 6 PM. 30 tablet 0  . memantine (NAMENDA) 5 MG tablet Take 5 mg by mouth 2 (two) times daily.    . methotrexate (RHEUMATREX) 2.5 MG tablet Take 25 mg by mouth once a week. Caution:Chemotherapy. Protect from light.  Taken on Wednesdays.    Marland Kitchen nystatin cream (MYCOSTATIN) Apply topically.    . primidone (MYSOLINE) 50 MG tablet Take 250 mg by mouth 3 (three)  times daily.     Marland Kitchen buPROPion (WELLBUTRIN) 75 MG tablet Take 75 mg by mouth 3 (three) times daily.     . zoledronic acid (RECLAST) 5 MG/100ML SOLN Inject 5 mg into the vein. Received yearly dose 06/11/13     No current facility-administered medications on file prior to visit.     Allergies  Allergen Reactions  . Lisinopril Cough    Family History  Problem Relation Age of Onset  . Diabetes Unknown        maternal aunts and uncles  . Diabetes Father   . Liver disease Father   . Emphysema Father        smoked  . Diabetes Sister   . Diabetes Brother   . Sarcoidosis Brother   . Emphysema Mother        smoked  . Asthma Mother   . Asthma Brother   . Colon cancer Neg Hx   . Esophageal cancer Neg Hx   . Stomach cancer Neg Hx   . Rectal cancer Neg Hx     BP 132/80   Pulse 83   Ht 5' (1.524 m)   Wt 174 lb (78.9 kg)   SpO2 96%   BMI 33.98 kg/m    Review of Systems She denies hypoglycemia.  Nausea is resolved    Objective:   Physical Exam VITAL SIGNS:  See vs page GENERAL: no distress Pulses: dorsalis pedis intact bilat.   MSK: no deformity of the feet CV: no leg edema Skin:  no ulcer on the feet.  normal color and temp on the feet. Neuro: sensation is intact to touch on the feet  Lab Results  Component Value Date   CREATININE 0.98 09/30/2015   BUN 14 09/30/2015   NA 143 09/30/2015   K 4.0 09/30/2015   CL 108 09/30/2015   CO2 28 09/30/2015    A1c=9.7%    Assessment & Plan:  Nausea is better: she can increase metformin now Insulin-requiring type 2 DM, with polyneuropathy: worse.  a1c is much higher than would be suggested by cbg's. I offered to recheck a1c in the lab and/or fructosamine.  she declines.  Patient Instructions  I have sent a prescription to your pharmacy, to double the metformin. Please also increase the insulin to 60 units each morning.  check your blood sugar twice a day.  vary the time of day when you check, between before the 3 meals,  and at  bedtime.  also check if you have symptoms of your blood sugar being too high or too low.  please keep a record of the readings and bring it to your next appointment here (or you can bring the meter itself).  You can write it on any piece of paper.  please call us sooner if your blood sugar goes below 70, or if you have a lot of readings over 200. Please come back for a follow-up appointment in 2 months.

## 2017-11-07 NOTE — Patient Instructions (Addendum)
I have sent a prescription to your pharmacy, to double the metformin. Please also increase the insulin to 60 units each morning.  check your blood sugar twice a day.  vary the time of day when you check, between before the 3 meals, and at bedtime.  also check if you have symptoms of your blood sugar being too high or too low.  please keep a record of the readings and bring it to your next appointment here (or you can bring the meter itself).  You can write it on any piece of paper.  please call us sooner if your blood sugar goes below 70, or if you have a lot of readings over 200. Please come back for a follow-up appointment in 2 months.

## 2017-12-04 DIAGNOSIS — J45901 Unspecified asthma with (acute) exacerbation: Secondary | ICD-10-CM | POA: Diagnosis not present

## 2017-12-04 DIAGNOSIS — F411 Generalized anxiety disorder: Secondary | ICD-10-CM | POA: Diagnosis not present

## 2017-12-04 DIAGNOSIS — E1165 Type 2 diabetes mellitus with hyperglycemia: Secondary | ICD-10-CM | POA: Diagnosis not present

## 2017-12-04 DIAGNOSIS — E1142 Type 2 diabetes mellitus with diabetic polyneuropathy: Secondary | ICD-10-CM | POA: Diagnosis not present

## 2017-12-04 DIAGNOSIS — Z794 Long term (current) use of insulin: Secondary | ICD-10-CM | POA: Diagnosis not present

## 2017-12-04 DIAGNOSIS — E1149 Type 2 diabetes mellitus with other diabetic neurological complication: Secondary | ICD-10-CM | POA: Diagnosis not present

## 2017-12-04 DIAGNOSIS — F320 Major depressive disorder, single episode, mild: Secondary | ICD-10-CM | POA: Diagnosis not present

## 2017-12-04 DIAGNOSIS — J111 Influenza due to unidentified influenza virus with other respiratory manifestations: Secondary | ICD-10-CM | POA: Diagnosis not present

## 2017-12-06 ENCOUNTER — Other Ambulatory Visit: Payer: Self-pay

## 2017-12-09 ENCOUNTER — Telehealth: Payer: Self-pay

## 2017-12-09 NOTE — Telephone Encounter (Signed)
Pharmacy stated that Metformin ER 1000 mg Gastr-TB is no longer covered. This has already been updated in the pts chart on 12/06/17

## 2017-12-16 MED ORDER — METFORMIN HCL ER (MOD) 1000 MG PO TB24: 2000.0000 mg | ORAL_TABLET | Freq: Every day | ORAL | 3 refills | Status: DC

## 2017-12-16 NOTE — Telephone Encounter (Signed)
Per Dr. Loanne Drilling- new rx sent in for metformin ER 1000 mg 2 qd. See meds.

## 2017-12-16 NOTE — Addendum Note (Signed)
Addended by: Cresenciano Lick on: 12/16/2017 08:08 AM   Modules accepted: Orders

## 2017-12-24 DIAGNOSIS — L408 Other psoriasis: Secondary | ICD-10-CM | POA: Diagnosis not present

## 2017-12-24 DIAGNOSIS — Z6834 Body mass index (BMI) 34.0-34.9, adult: Secondary | ICD-10-CM | POA: Diagnosis not present

## 2017-12-24 DIAGNOSIS — L4050 Arthropathic psoriasis, unspecified: Secondary | ICD-10-CM | POA: Diagnosis not present

## 2017-12-24 DIAGNOSIS — E669 Obesity, unspecified: Secondary | ICD-10-CM | POA: Diagnosis not present

## 2017-12-24 DIAGNOSIS — M2550 Pain in unspecified joint: Secondary | ICD-10-CM | POA: Diagnosis not present

## 2017-12-24 DIAGNOSIS — Z79899 Other long term (current) drug therapy: Secondary | ICD-10-CM | POA: Diagnosis not present

## 2017-12-27 ENCOUNTER — Other Ambulatory Visit: Payer: Self-pay

## 2017-12-27 MED ORDER — METFORMIN HCL 1000 MG PO TABS: ORAL_TABLET | ORAL | 3 refills | Status: DC

## 2018-01-07 ENCOUNTER — Ambulatory Visit (INDEPENDENT_AMBULATORY_CARE_PROVIDER_SITE_OTHER): Payer: Medicare PPO | Admitting: Endocrinology

## 2018-01-07 ENCOUNTER — Encounter: Payer: Self-pay | Admitting: Endocrinology

## 2018-01-07 VITALS — BP 165/85 | HR 83 | Wt 176.6 lb

## 2018-01-07 DIAGNOSIS — Z794 Long term (current) use of insulin: Secondary | ICD-10-CM

## 2018-01-07 DIAGNOSIS — E1142 Type 2 diabetes mellitus with diabetic polyneuropathy: Secondary | ICD-10-CM

## 2018-01-07 LAB — POCT GLYCOSYLATED HEMOGLOBIN (HGB A1C): Hemoglobin A1C: 8

## 2018-01-07 MED ORDER — INSULIN GLARGINE 100 UNIT/ML ~~LOC~~ SOLN: 65.0000 [IU] | SUBCUTANEOUS | 11 refills | Status: DC

## 2018-01-07 NOTE — Patient Instructions (Addendum)
Your blood pressure is high today.  Please see your primary care provider soon, to have it rechecked Please also increase the insulin to 65 units each morning.  check your blood sugar twice a day.  vary the time of day when you check, between before the 3 meals, and at bedtime.  also check if you have symptoms of your blood sugar being too high or too low.  please keep a record of the readings and bring it to your next appointment here (or you can bring the meter itself).  You can write it on any piece of paper.  please call us sooner if your blood sugar goes below 70, or if you have a lot of readings over 200.   Please come back for a follow-up appointment in 2 months.

## 2018-01-07 NOTE — Progress Notes (Signed)
Subjective:    Patient ID: Carrie [name], female    DOB: [birth year], 62 y.o.   MRN: [id number]  HPI Pt returns for f/u of diabetes mellitus: DM type: Insulin-requiring type 2 Dx'ed: 2010. Complications: painful polyneuropathy and gastroparesis.  Therapy: insulin since 2011.  GDM: 2000.  DKA: never. Severe hypoglycemia: never.  Pancreatitis: never.  Other: she did not tolerate invokana (vaginitis); she was on multiple daily injections in the past, but could not remember to take; metformin dosage is limited by nausea; psoriasis raises possibility she is evolving type 1 DM.  Interval history: no cbg record, but states cbg's vary from 98-200's.  There is no trend throughout the day.   pt states she feels well in general.  She never misses the insulin.  Past Medical History:  Diagnosis Date  . Anxiety   . Arthritis    psoriatic arithritis, DDD  . Asthma   . Depression   . Diabetes mellitus   . Gastritis   . Gastroparesis   . GERD (gastroesophageal reflux disease)   . Hernia   . Hyperlipemia   . Hyperlipemia   . Hypertension   . Neuropathy    Bil feet re: to Diabetes  . Obesity   . Osteoporosis   . Plantar fasciitis   . Stricture and stenosis of esophagus   . TIA (transient ischemic attack)   . Tuberculosis    tested positive 2011, no symptoms, was on medicine for 6 months    Past Surgical History:  Procedure Laterality Date  . BREAST SURGERY     biopsies both breast  . CESAREAN SECTION     x 2   . JOINT REPLACEMENT     bilat knee   . KNEE SURGERY     Bilateral  replacement    Social History   Socioeconomic History  . Marital status: Single    Spouse name: Not on file  . Number of children: 2  . Years of education: Not on file  . Highest education level: Not on file  Occupational History  . Occupation: Disabled   Social Needs  . Financial resource strain: Not on file  . Food insecurity:    Worry: Not on file    Inability: Not on file  . Transportation  needs:    Medical: Not on file    Non-medical: Not on file  Tobacco Use  . Smoking status: Never Smoker  . Smokeless tobacco: Never Used  Substance and Sexual Activity  . Alcohol use: Yes    Comment: socially  . Drug use: No  . Sexual activity: Not on file  Lifestyle  . Physical activity:    Days per week: Not on file    Minutes per session: Not on file  . Stress: Not on file  Relationships  . Social connections:    Talks on phone: Not on file    Gets together: Not on file    Attends religious service: Not on file    Active member of club or organization: Not on file    Attends meetings of clubs or organizations: Not on file    Relationship status: Not on file  . Intimate partner violence:    Fear of current or ex partner: Not on file    Emotionally abused: Not on file    Physically abused: Not on file    Forced sexual activity: Not on file  Other Topics Concern  . Not on file  Social History Narrative  [place] daily     Current Outpatient Medications on File Prior to Visit  Medication Sig Dispense Refill  . adalimumab (HUMIRA) 40 MG/0.8ML injection Inject 40 mg into the skin once a week. Sundays.    Marland Kitchen albuterol (PROAIR HFA) 108 (90 BASE) MCG/ACT inhaler Inhale 2 puffs into the lungs every 4 (four) hours as needed for wheezing or shortness of breath.    Marland Kitchen albuterol (PROVENTIL) (2.5 MG/3ML) 0.083% nebulizer solution Take 2.5 mg by nebulization every 4 (four) hours as needed for wheezing or shortness of breath.     Marland Kitchen amLODipine (NORVASC) 5 MG tablet Take 1 tablet (5 mg total) by mouth daily. 30 tablet 0  . aspirin EC 81 MG tablet Take 81 mg by mouth every morning.    . Calcium Carbonate-Vitamin D 600-400 MG-UNIT per tablet Take 1 tablet by mouth daily.    Marland Kitchen diltiazem (DILACOR XR) 180 MG 24 hr capsule Take 180 mg by mouth daily.    Marland Kitchen esomeprazole (NEXIUM) 40 MG capsule Take 40 mg by mouth at bedtime.    . folic acid (FOLVITE) 1 MG tablet Take 1 mg by mouth every morning.       . gabapentin (NEURONTIN) 300 MG capsule Take 300 mg by mouth 3 (three) times daily.   5  . losartan (COZAAR) 50 MG tablet Take by mouth 2 (two) times daily. 100 mg in the morning 50 mg at bedtime.    . memantine (NAMENDA) 5 MG tablet Take 5 mg by mouth 2 (two) times daily.    . metFORMIN (GLUCOPHAGE) 1000 MG tablet Take two tablets (2000 mg) daily with breakfast. 180 tablet 3  . methotrexate (RHEUMATREX) 2.5 MG tablet Take 25 mg by mouth once a week. Caution:Chemotherapy. Protect from light.  Taken on Wednesdays.    Marland Kitchen nystatin cream (MYCOSTATIN) Apply topically.    . pravastatin (PRAVACHOL) 20 MG tablet Take 20 mg by mouth daily.    . primidone (MYSOLINE) 50 MG tablet Take 250 mg by mouth 3 (three) times daily.     Marland Kitchen buPROPion (WELLBUTRIN) 75 MG tablet Take 75 mg by mouth 3 (three) times daily.     Marland Kitchen lovastatin (MEVACOR) 20 MG tablet Take 2 tablets (40 mg total) by mouth daily at 6 PM. 30 tablet 0  . zoledronic acid (RECLAST) 5 MG/100ML SOLN Inject 5 mg into the vein. Received yearly dose 06/11/13     No current facility-administered medications on file prior to visit.     Allergies  Allergen Reactions  . Lisinopril Cough    Family History  Problem Relation Age of Onset  . Diabetes Unknown        maternal aunts and uncles  . Diabetes Father   . Liver disease Father   . Emphysema Father        smoked  . Diabetes Sister   . Diabetes Brother   . Sarcoidosis Brother   . Emphysema Mother        smoked  . Asthma Mother   . Asthma Brother   . Colon cancer Neg Hx   . Esophageal cancer Neg Hx   . Stomach cancer Neg Hx   . Rectal cancer Neg Hx     BP (!) 165/85 (BP Location: Left Arm, Cuff Size: Normal)   Pulse 83   Wt 176 lb 9.6 oz (80.1 kg)   SpO2 95%   BMI 34.49 kg/m    Review of Systems She denies hypoglycemia    Objective:   Physical  Exam VITAL SIGNS:  See vs page GENERAL: no distress Pulses: foot pulses are intact bilaterally.   MSK: no deformity of the feet or  ankles.  CV: no edema of the legs or ankles Skin:  no ulcer on the feet or ankles.  normal color and temp on the feet and ankles Neuro: sensation is intact to touch on the feet and ankles.     Lab Results  Component Value Date   HGBA1C 8.0 01/07/2018      Assessment & Plan:  Insulin-requiring type 2 DM, with gastroparesis: she needs increased rx.   HTN: is noted today.     Patient Instructions  Your blood pressure is high today.  Please see your primary care provider soon, to have it rechecked Please also increase the insulin to 65 units each morning.  check your blood sugar twice a day.  vary the time of day when you check, between before the 3 meals, and at bedtime.  also check if you have symptoms of your blood sugar being too high or too low.  please keep a record of the readings and bring it to your next appointment here (or you can bring the meter itself).  You can write it on any piece of paper.  please call us sooner if your blood sugar goes below 70, or if you have a lot of readings over 200.   Please come back for a follow-up appointment in 2 months.

## 2018-01-10 DIAGNOSIS — E1149 Type 2 diabetes mellitus with other diabetic neurological complication: Secondary | ICD-10-CM | POA: Diagnosis not present

## 2018-01-10 DIAGNOSIS — F411 Generalized anxiety disorder: Secondary | ICD-10-CM | POA: Diagnosis not present

## 2018-01-10 DIAGNOSIS — K219 Gastro-esophageal reflux disease without esophagitis: Secondary | ICD-10-CM | POA: Diagnosis not present

## 2018-01-10 DIAGNOSIS — E785 Hyperlipidemia, unspecified: Secondary | ICD-10-CM | POA: Diagnosis not present

## 2018-01-10 DIAGNOSIS — I10 Essential (primary) hypertension: Secondary | ICD-10-CM | POA: Diagnosis not present

## 2018-02-03 DIAGNOSIS — M5442 Lumbago with sciatica, left side: Secondary | ICD-10-CM | POA: Diagnosis not present

## 2018-02-03 DIAGNOSIS — M25562 Pain in left knee: Secondary | ICD-10-CM | POA: Diagnosis not present

## 2018-02-11 DIAGNOSIS — R0781 Pleurodynia: Secondary | ICD-10-CM | POA: Diagnosis not present

## 2018-02-11 DIAGNOSIS — S299XXA Unspecified injury of thorax, initial encounter: Secondary | ICD-10-CM | POA: Diagnosis not present

## 2018-02-11 DIAGNOSIS — W19XXXA Unspecified fall, initial encounter: Secondary | ICD-10-CM | POA: Diagnosis not present

## 2018-02-18 DIAGNOSIS — R413 Other amnesia: Secondary | ICD-10-CM | POA: Diagnosis not present

## 2018-02-18 DIAGNOSIS — R251 Tremor, unspecified: Secondary | ICD-10-CM | POA: Diagnosis not present

## 2018-02-18 DIAGNOSIS — E1142 Type 2 diabetes mellitus with diabetic polyneuropathy: Secondary | ICD-10-CM | POA: Diagnosis not present

## 2018-02-18 DIAGNOSIS — L409 Psoriasis, unspecified: Secondary | ICD-10-CM | POA: Diagnosis not present

## 2018-02-19 DIAGNOSIS — M25562 Pain in left knee: Secondary | ICD-10-CM | POA: Diagnosis not present

## 2018-03-11 ENCOUNTER — Ambulatory Visit (INDEPENDENT_AMBULATORY_CARE_PROVIDER_SITE_OTHER): Payer: Medicare PPO | Admitting: Endocrinology

## 2018-03-11 ENCOUNTER — Encounter: Payer: Self-pay | Admitting: Endocrinology

## 2018-03-11 VITALS — BP 132/62 | HR 102 | Wt 180.4 lb

## 2018-03-11 DIAGNOSIS — Z794 Long term (current) use of insulin: Secondary | ICD-10-CM

## 2018-03-11 DIAGNOSIS — Z23 Encounter for immunization: Secondary | ICD-10-CM | POA: Diagnosis not present

## 2018-03-11 DIAGNOSIS — M62831 Muscle spasm of calf: Secondary | ICD-10-CM | POA: Diagnosis not present

## 2018-03-11 DIAGNOSIS — E1142 Type 2 diabetes mellitus with diabetic polyneuropathy: Secondary | ICD-10-CM

## 2018-03-11 DIAGNOSIS — L409 Psoriasis, unspecified: Secondary | ICD-10-CM | POA: Diagnosis not present

## 2018-03-11 LAB — POCT GLYCOSYLATED HEMOGLOBIN (HGB A1C): HEMOGLOBIN A1C: 8.6 % — AB (ref 4.0–5.6)

## 2018-03-11 NOTE — Progress Notes (Signed)
Subjective:    Patient ID: Carrie [name], female    DOB: Apr 03, 1956, 62 y.o.   MRN: [id number]  HPI Pt returns for f/u of diabetes mellitus: DM type: Insulin-requiring type 2 Dx'ed: 2010. Complications: painful polyneuropathy and gastroparesis.  Therapy: insulin since 2011.  GDM: 2000.  DKA: never. Severe hypoglycemia: never.  Pancreatitis: never.  Other: she did not tolerate invokana (vaginitis); she was on multiple daily injections in the past, but could not remember to take; metformin dosage is limited by nausea; psoriasis raises possibility she is evolving type 1 DM.  Interval history: no cbg record, but states cbg's are in the low-100's.  There is no trend throughout the day.   pt states she feels well in general.  She never misses the insulin, but she takes only 60 units qam.  Past Medical History:  Diagnosis Date  . Anxiety   . Arthritis    psoriatic arithritis, DDD  . Asthma   . Depression   . Diabetes mellitus   . Gastritis   . Gastroparesis   . GERD (gastroesophageal reflux disease)   . Hernia   . Hyperlipemia   . Hyperlipemia   . Hypertension   . Neuropathy    Bil feet re: to Diabetes  . Obesity   . Osteoporosis   . Plantar fasciitis   . Stricture and stenosis of esophagus   . TIA (transient ischemic attack)   . Tuberculosis    tested positive 2011, no symptoms, was on medicine for 6 months    Past Surgical History:  Procedure Laterality Date  . BREAST SURGERY     biopsies both breast  . CESAREAN SECTION     x 2   . JOINT REPLACEMENT     bilat knee   . KNEE SURGERY     Bilateral  replacement    Social History   Socioeconomic History  . Marital status: Single    Spouse name: Not on file  . Number of children: 2  . Years of education: Not on file  . Highest education level: Not on file  Occupational History  . Occupation: Disabled   Social Needs  . Financial resource strain: Not on file  . Food insecurity:    Worry: Not on file   Inability: Not on file  . Transportation needs:    Medical: Not on file    Non-medical: Not on file  Tobacco Use  . Smoking status: Never Smoker  . Smokeless tobacco: Never Used  Substance and Sexual Activity  . Alcohol use: Yes    Comment: socially  . Drug use: No  . Sexual activity: Not on file  Lifestyle  . Physical activity:    Days per week: Not on file    Minutes per session: Not on file  . Stress: Not on file  Relationships  . Social connections:    Talks on phone: Not on file    Gets together: Not on file    Attends religious service: Not on file    Active member of club or organization: Not on file    Attends meetings of clubs or organizations: Not on file    Relationship status: Not on file  . Intimate partner violence:    Fear of current or ex partner: Not on file    Emotionally abused: Not on file    Physically abused: Not on file    Forced sexual activity: Not on file  Other Topics Concern  . Not on file  Social History Narrative   [place] daily     Current Outpatient Medications on File Prior to Visit  Medication Sig Dispense Refill  . adalimumab (HUMIRA) 40 MG/0.8ML injection Inject 40 mg into the skin once a week. Sundays.    Marland Kitchen albuterol (PROAIR HFA) 108 (90 BASE) MCG/ACT inhaler Inhale 2 puffs into the lungs every 4 (four) hours as needed for wheezing or shortness of breath.    Marland Kitchen albuterol (PROVENTIL) (2.5 MG/3ML) 0.083% nebulizer solution Take 2.5 mg by nebulization every 4 (four) hours as needed for wheezing or shortness of breath.     Marland Kitchen amLODipine (NORVASC) 5 MG tablet Take 1 tablet (5 mg total) by mouth daily. 30 tablet 0  . aspirin EC 81 MG tablet Take 81 mg by mouth every morning.    Marland Kitchen buPROPion (WELLBUTRIN) 75 MG tablet Take 75 mg by mouth 3 (three) times daily.     . Calcium Carbonate-Vitamin D 600-400 MG-UNIT per tablet Take 1 tablet by mouth daily.    Marland Kitchen diltiazem (DILACOR XR) 180 MG 24 hr capsule Take 180 mg by mouth daily.    Marland Kitchen esomeprazole  (NEXIUM) 40 MG capsule Take 40 mg by mouth at bedtime.    . folic acid (FOLVITE) 1 MG tablet Take 1 mg by mouth every morning.     . gabapentin (NEURONTIN) 300 MG capsule Take 300 mg by mouth 3 (three) times daily.   5  . insulin glargine (LANTUS) 100 UNIT/ML injection Inject 0.65 mLs (65 Units total) into the skin every morning. And syringes 1/day 30 mL 11  . losartan (COZAAR) 50 MG tablet Take by mouth 2 (two) times daily. 100 mg in the morning 50 mg at bedtime.    . lovastatin (MEVACOR) 20 MG tablet Take 2 tablets (40 mg total) by mouth daily at 6 PM. 30 tablet 0  . memantine (NAMENDA) 5 MG tablet Take 5 mg by mouth 2 (two) times daily.    . metFORMIN (GLUCOPHAGE) 1000 MG tablet Take two tablets (2000 mg) daily with breakfast. 180 tablet 3  . methotrexate (RHEUMATREX) 2.5 MG tablet Take 25 mg by mouth once a week. Caution:Chemotherapy. Protect from light.  Taken on Wednesdays.    . pravastatin (PRAVACHOL) 20 MG tablet Take 20 mg by mouth daily.    . primidone (MYSOLINE) 50 MG tablet Take 250 mg by mouth 3 (three) times daily.     . zoledronic acid (RECLAST) 5 MG/100ML SOLN Inject 5 mg into the vein. Received yearly dose 06/11/13     No current facility-administered medications on file prior to visit.     Allergies  Allergen Reactions  . Lisinopril Cough    Family History  Problem Relation Age of Onset  . Diabetes Unknown        maternal aunts and uncles  . Diabetes Father   . Liver disease Father   . Emphysema Father        smoked  . Diabetes Sister   . Diabetes Brother   . Sarcoidosis Brother   . Emphysema Mother        smoked  . Asthma Mother   . Asthma Brother   . Colon cancer Neg Hx   . Esophageal cancer Neg Hx   . Stomach cancer Neg Hx   . Rectal cancer Neg Hx     BP 132/62   Pulse (!) 102   Wt 180 lb 6.4 oz (81.8 kg)   SpO2 95%   BMI 35.23 kg/m  Review of Systems She denies hypoglycemia.    Objective:   Physical Exam VITAL SIGNS:  See vs page GENERAL:  no distress Pulses: foot pulses are intact bilaterally.   MSK: no deformity of the feet or ankles.  CV: no edema of the legs or ankles Skin:  no ulcer on the feet or ankles.  normal color and temp on the feet and ankles Neuro: sensation is intact to touch on the feet and ankles.    Lab Results  Component Value Date   HGBA1C 8.6 (A) 03/11/2018   Lab Results  Component Value Date   CREATININE 0.98 09/30/2015   BUN 14 09/30/2015   NA 143 09/30/2015   K 4.0 09/30/2015   CL 108 09/30/2015   CO2 28 09/30/2015       Assessment & Plan:  Insulin-requiring type 2 DM, with gastroparesis: worse.  Psoriasis: she is prob evolving type 1, so she is not a candidate for trial of oral rx.     Patient Instructions  Please also increase the insulin to 65 units each morning.  check your blood sugar twice a day.  vary the time of day when you check, between before the 3 meals, and at bedtime.  also check if you have symptoms of your blood sugar being too high or too low.  please keep a record of the readings and bring it to your next appointment here (or you can bring the meter itself).  You can write it on any piece of paper.  please call us sooner if your blood sugar goes below 70, or if you have a lot of readings over 200.   Please come back for a follow-up appointment in 2 months.

## 2018-03-11 NOTE — Patient Instructions (Addendum)
Please also increase the insulin to 65 units each morning.  check your blood sugar twice a day.  vary the time of day when you check, between before the 3 meals, and at bedtime.  also check if you have symptoms of your blood sugar being too high or too low.  please keep a record of the readings and bring it to your next appointment here (or you can bring the meter itself).  You can write it on any piece of paper.  please call us sooner if your blood sugar goes below 70, or if you have a lot of readings over 200.   Please come back for a follow-up appointment in 2 months.

## 2018-03-13 ENCOUNTER — Ambulatory Visit: Payer: Self-pay | Admitting: Endocrinology

## 2018-03-17 DIAGNOSIS — M25562 Pain in left knee: Secondary | ICD-10-CM | POA: Diagnosis not present

## 2018-03-27 DIAGNOSIS — L4050 Arthropathic psoriasis, unspecified: Secondary | ICD-10-CM | POA: Diagnosis not present

## 2018-03-27 DIAGNOSIS — Z79899 Other long term (current) drug therapy: Secondary | ICD-10-CM | POA: Diagnosis not present

## 2018-03-27 DIAGNOSIS — E669 Obesity, unspecified: Secondary | ICD-10-CM | POA: Diagnosis not present

## 2018-03-27 DIAGNOSIS — M2550 Pain in unspecified joint: Secondary | ICD-10-CM | POA: Diagnosis not present

## 2018-03-27 DIAGNOSIS — Z6834 Body mass index (BMI) 34.0-34.9, adult: Secondary | ICD-10-CM | POA: Diagnosis not present

## 2018-03-27 DIAGNOSIS — L408 Other psoriasis: Secondary | ICD-10-CM | POA: Diagnosis not present

## 2018-04-12 ENCOUNTER — Other Ambulatory Visit: Payer: Self-pay | Admitting: Endocrinology

## 2018-04-30 DIAGNOSIS — I10 Essential (primary) hypertension: Secondary | ICD-10-CM | POA: Diagnosis not present

## 2018-04-30 DIAGNOSIS — N183 Chronic kidney disease, stage 3 (moderate): Secondary | ICD-10-CM | POA: Diagnosis not present

## 2018-05-07 DIAGNOSIS — N390 Urinary tract infection, site not specified: Secondary | ICD-10-CM | POA: Diagnosis not present

## 2018-05-07 DIAGNOSIS — I10 Essential (primary) hypertension: Secondary | ICD-10-CM | POA: Diagnosis not present

## 2018-05-07 DIAGNOSIS — N183 Chronic kidney disease, stage 3 (moderate): Secondary | ICD-10-CM | POA: Diagnosis not present

## 2018-05-09 ENCOUNTER — Encounter: Payer: Self-pay | Admitting: Endocrinology

## 2018-05-09 ENCOUNTER — Ambulatory Visit (INDEPENDENT_AMBULATORY_CARE_PROVIDER_SITE_OTHER): Payer: Medicare PPO | Admitting: Endocrinology

## 2018-05-09 VITALS — BP 128/78 | HR 93 | Ht 60.0 in | Wt 174.8 lb

## 2018-05-09 DIAGNOSIS — Z794 Long term (current) use of insulin: Secondary | ICD-10-CM

## 2018-05-09 DIAGNOSIS — E1142 Type 2 diabetes mellitus with diabetic polyneuropathy: Secondary | ICD-10-CM | POA: Diagnosis not present

## 2018-05-09 LAB — POCT GLYCOSYLATED HEMOGLOBIN (HGB A1C): HEMOGLOBIN A1C: 8.8 % — AB (ref 4.0–5.6)

## 2018-05-09 MED ORDER — SITAGLIPTIN PHOSPHATE 100 MG PO TABS: 100.0000 mg | ORAL_TABLET | Freq: Every day | ORAL | 11 refills | Status: DC

## 2018-05-09 MED ORDER — GLUCOSE BLOOD VI STRP: 1.0000 | ORAL_STRIP | Freq: Two times a day (BID) | 12 refills | Status: DC

## 2018-05-09 MED ORDER — INSULIN GLARGINE 100 UNIT/ML ~~LOC~~ SOLN: 50.0000 [IU] | SUBCUTANEOUS | 11 refills | Status: DC

## 2018-05-09 NOTE — Progress Notes (Signed)
Subjective:    Patient ID: Carrie [name], female    DOB: [birth year], 62 y.o.   MRN: [id number]  HPI Pt returns for f/u of diabetes mellitus: DM type: Insulin-requiring type 2 Dx'ed: 2010. Complications: painful polyneuropathy and gastroparesis.  Therapy: insulin since 2011, and metformin.   GDM: 2000.  DKA: never. Severe hypoglycemia: never.  Pancreatitis: never.  Other: she did not tolerate invokana (vaginitis); she was on multiple daily injections in the past, but could not remember to take; metformin dosage is limited by nausea; psoriasis raises possibility she is evolving type 1 DM.  Interval history: no cbg record, but states cbg's are in the 200's.  It is in general higher as the day goes on.   pt states she feels well in general.  She never misses the insulin.   Past Medical History:  Diagnosis Date  . Anxiety   . Arthritis    psoriatic arithritis, DDD  . Asthma   . Depression   . Diabetes mellitus   . Gastritis   . Gastroparesis   . GERD (gastroesophageal reflux disease)   . Hernia   . Hyperlipemia   . Hyperlipemia   . Hypertension   . Neuropathy    Bil feet re: to Diabetes  . Obesity   . Osteoporosis   . Plantar fasciitis   . Stricture and stenosis of esophagus   . TIA (transient ischemic attack)   . Tuberculosis    tested positive 2011, no symptoms, was on medicine for 6 months    Past Surgical History:  Procedure Laterality Date  . BREAST SURGERY     biopsies both breast  . CESAREAN SECTION     x 2   . JOINT REPLACEMENT     bilat knee   . KNEE SURGERY     Bilateral  replacement    Social History   Socioeconomic History  . Marital status: Single    Spouse name: Not on file  . Number of children: 2  . Years of education: Not on file  . Highest education level: Not on file  Occupational History  . Occupation: Disabled   Social Needs  . Financial resource strain: Not on file  . Food insecurity:    Worry: Not on file    Inability: Not on  file  . Transportation needs:    Medical: Not on file    Non-medical: Not on file  Tobacco Use  . Smoking status: Never Smoker  . Smokeless tobacco: Never Used  Substance and Sexual Activity  . Alcohol use: Yes    Comment: socially  . Drug use: No  . Sexual activity: Not on file  Lifestyle  . Physical activity:    Days per week: Not on file    Minutes per session: Not on file  . Stress: Not on file  Relationships  . Social connections:    Talks on phone: Not on file    Gets together: Not on file    Attends religious service: Not on file    Active member of club or organization: Not on file    Attends meetings of clubs or organizations: Not on file    Relationship status: Not on file  . Intimate partner violence:    Fear of current or ex partner: Not on file    Emotionally abused: Not on file    Physically abused: Not on file    Forced sexual activity: Not on file  Other Topics Concern  . Not  on file  Social History Narrative   Sodas daily     Current Outpatient Medications on File Prior to Visit  Medication Sig Dispense Refill  . adalimumab (HUMIRA) 40 MG/0.8ML injection Inject 40 mg into the skin once a week. Sundays.    Marland Kitchen albuterol (PROAIR HFA) 108 (90 BASE) MCG/ACT inhaler Inhale 2 puffs into the lungs every 4 (four) hours as needed for wheezing or shortness of breath.    Marland Kitchen albuterol (PROVENTIL) (2.5 MG/3ML) 0.083% nebulizer solution Take 2.5 mg by nebulization every 4 (four) hours as needed for wheezing or shortness of breath.     Marland Kitchen amLODipine (NORVASC) 5 MG tablet Take 1 tablet (5 mg total) by mouth daily. 30 tablet 0  . aspirin EC 81 MG tablet Take 81 mg by mouth every morning.    Marland Kitchen buPROPion (WELLBUTRIN) 75 MG tablet Take 75 mg by mouth 3 (three) times daily.     . Calcium Carbonate-Vitamin D 600-400 MG-UNIT per tablet Take 1 tablet by mouth daily.    Marland Kitchen diltiazem (DILACOR XR) 180 MG 24 hr capsule Take 180 mg by mouth daily.    Marland Kitchen esomeprazole (NEXIUM) 40 MG capsule  Take 40 mg by mouth 3 (three) times daily.     . folic acid (FOLVITE) 1 MG tablet Take 1 mg by mouth every morning.     . gabapentin (NEURONTIN) 300 MG capsule Take 300 mg by mouth 3 (three) times daily.   5  . losartan (COZAAR) 50 MG tablet Take by mouth 2 (two) times daily. 100 mg in the morning 50 mg at bedtime.    . lovastatin (MEVACOR) 20 MG tablet Take 2 tablets (40 mg total) by mouth daily at 6 PM. 30 tablet 0  . memantine (NAMENDA) 5 MG tablet Take 5 mg by mouth 2 (two) times daily.    . metFORMIN (GLUCOPHAGE) 1000 MG tablet Take two tablets (2000 mg) daily with breakfast. 180 tablet 3  . methotrexate (RHEUMATREX) 2.5 MG tablet Take 25 mg by mouth once a week. Caution:Chemotherapy. Protect from light.  Taken on Wednesdays.    . pravastatin (PRAVACHOL) 20 MG tablet Take 20 mg by mouth daily.    . primidone (MYSOLINE) 50 MG tablet Take 250 mg by mouth 3 (three) times daily.     . zoledronic acid (RECLAST) 5 MG/100ML SOLN Inject 5 mg into the vein. Received yearly dose 06/11/13     No current facility-administered medications on file prior to visit.     Allergies  Allergen Reactions  . Lisinopril Cough    Family History  Problem Relation Age of Onset  . Diabetes Unknown        maternal aunts and uncles  . Diabetes Father   . Liver disease Father   . Emphysema Father        smoked  . Diabetes Sister   . Diabetes Brother   . Sarcoidosis Brother   . Emphysema Mother        smoked  . Asthma Mother   . Asthma Brother   . Colon cancer Neg Hx   . Esophageal cancer Neg Hx   . Stomach cancer Neg Hx   . Rectal cancer Neg Hx     BP 128/78 (BP Location: Left Arm, Patient Position: Sitting, Cuff Size: Normal)   Pulse 93   Ht 5' (1.524 m)   Wt 174 lb 12.8 oz (79.3 kg)   SpO2 97%   BMI 34.14 kg/m    Review of Systems She  denies hypoglycemia.      Objective:   Physical Exam VITAL SIGNS:  See vs page GENERAL: no distress Pulses: dorsalis pedis intact bilat.   MSK: no  deformity of the feet CV: no leg edema Skin:  no ulcer on the feet.  normal color and temp on the feet. Neuro: sensation is intact to touch on the feet.    Lab Results  Component Value Date   HGBA1C 8.8 (A) 05/09/2018   Lab Results  Component Value Date   CREATININE 0.98 09/30/2015   BUN 14 09/30/2015   NA 143 09/30/2015   K 4.0 09/30/2015   CL 108 09/30/2015   CO2 28 09/30/2015      Assessment & Plan:  Insulin-requiring type 2 DM, with polyneuropathy: worse.  We discussed rx options.  She wants to add more non-insulin rx, rather than increasing insulin Vaginitis: this limits rx options.  Patient Instructions  I have sent a prescription to your pharmacy, to add "Januvia." Please also decrease the insulin to 50 units each morning.  Please continue the same metformin.   check your blood sugar twice a day.  vary the time of day when you check, between before the 3 meals, and at bedtime.  also check if you have symptoms of your blood sugar being too high or too low.  please keep a record of the readings and bring it to your next appointment here (or you can bring the meter itself).  You can write it on any piece of paper.  please call us sooner if your blood sugar goes below 70, or if you have a lot of readings over 200.   Please come back for a follow-up appointment in 2 months.

## 2018-05-09 NOTE — Patient Instructions (Addendum)
I have sent a prescription to your pharmacy, to add "Januvia." Please also decrease the insulin to 50 units each morning.  Please continue the same metformin.   check your blood sugar twice a day.  vary the time of day when you check, between before the 3 meals, and at bedtime.  also check if you have symptoms of your blood sugar being too high or too low.  please keep a record of the readings and bring it to your next appointment here (or you can bring the meter itself).  You can write it on any piece of paper.  please call us sooner if your blood sugar goes below 70, or if you have a lot of readings over 200.   Please come back for a follow-up appointment in 2 months.

## 2018-05-19 ENCOUNTER — Encounter: Payer: Self-pay | Admitting: Gastroenterology

## 2018-05-27 ENCOUNTER — Other Ambulatory Visit: Payer: Self-pay

## 2018-05-27 ENCOUNTER — Emergency Department (HOSPITAL_COMMUNITY): Payer: Medicare PPO

## 2018-05-27 ENCOUNTER — Encounter (HOSPITAL_COMMUNITY): Payer: Self-pay | Admitting: Emergency Medicine

## 2018-05-27 ENCOUNTER — Emergency Department (HOSPITAL_COMMUNITY)
Admission: EM | Admit: 2018-05-27 | Discharge: 2018-05-28 | Disposition: A | Discharge status: Home / Self Care | Payer: Medicare PPO | Attending: Emergency Medicine | Admitting: Emergency Medicine

## 2018-05-27 DIAGNOSIS — R51 Headache: Secondary | ICD-10-CM | POA: Insufficient documentation

## 2018-05-27 DIAGNOSIS — J9811 Atelectasis: Secondary | ICD-10-CM | POA: Diagnosis not present

## 2018-05-27 DIAGNOSIS — R42 Dizziness and giddiness: Secondary | ICD-10-CM | POA: Insufficient documentation

## 2018-05-27 DIAGNOSIS — R251 Tremor, unspecified: Secondary | ICD-10-CM | POA: Insufficient documentation

## 2018-05-27 DIAGNOSIS — R519 Headache, unspecified: Secondary | ICD-10-CM

## 2018-05-27 DIAGNOSIS — R110 Nausea: Secondary | ICD-10-CM | POA: Insufficient documentation

## 2018-05-27 DIAGNOSIS — G4489 Other headache syndrome: Secondary | ICD-10-CM | POA: Diagnosis not present

## 2018-05-27 DIAGNOSIS — R0789 Other chest pain: Secondary | ICD-10-CM | POA: Diagnosis not present

## 2018-05-27 DIAGNOSIS — R079 Chest pain, unspecified: Secondary | ICD-10-CM | POA: Diagnosis not present

## 2018-05-27 DIAGNOSIS — R2681 Unsteadiness on feet: Secondary | ICD-10-CM | POA: Insufficient documentation

## 2018-05-27 DIAGNOSIS — Z8673 Personal history of transient ischemic attack (TIA), and cerebral infarction without residual deficits: Secondary | ICD-10-CM | POA: Diagnosis not present

## 2018-05-27 DIAGNOSIS — R52 Pain, unspecified: Secondary | ICD-10-CM | POA: Diagnosis not present

## 2018-05-27 DIAGNOSIS — S0990XA Unspecified injury of head, initial encounter: Secondary | ICD-10-CM | POA: Diagnosis not present

## 2018-05-27 LAB — CBC
HEMATOCRIT: 35.6 % — AB (ref 36.0–46.0)
HEMOGLOBIN: 11.6 g/dL — AB (ref 12.0–15.0)
MCH: 31.3 pg (ref 26.0–34.0)
MCHC: 32.6 g/dL (ref 30.0–36.0)
MCV: 96 fL (ref 78.0–100.0)
Platelets: 83 10*3/uL — ABNORMAL LOW (ref 150–400)
RBC: 3.71 MIL/uL — AB (ref 3.87–5.11)
RDW: 14.5 % (ref 11.5–15.5)
WBC: 3.9 10*3/uL — ABNORMAL LOW (ref 4.0–10.5)

## 2018-05-27 LAB — BASIC METABOLIC PANEL
Anion gap: 13 (ref 5–15)
BUN: 13 mg/dL (ref 6–20)
CO2: 20 mmol/L — ABNORMAL LOW (ref 22–32)
Calcium: 8.3 mg/dL — ABNORMAL LOW (ref 8.9–10.3)
Chloride: 106 mmol/L (ref 98–111)
Creatinine, Ser: 1.28 mg/dL — ABNORMAL HIGH (ref 0.44–1.00)
GFR calc Af Amer: 52 mL/min — ABNORMAL LOW (ref >60)
GFR, EST NON AFRICAN AMERICAN: 45 mL/min — AB (ref >60)
Glucose, Bld: 146 mg/dL — ABNORMAL HIGH (ref 70–99)
POTASSIUM: 4.5 mmol/L (ref 3.5–5.1)
SODIUM: 139 mmol/L (ref 135–145)

## 2018-05-27 LAB — I-STAT TROPONIN, ED
TROPONIN I, POC: 0 ng/mL (ref 0.00–0.08)
Troponin i, poc: 0 ng/mL (ref 0.00–0.08)

## 2018-05-27 MED ORDER — KETOROLAC TROMETHAMINE 30 MG/ML IJ SOLN
30.0000 mg | Freq: Once | INTRAMUSCULAR | Status: AC
  Administered 2018-05-27: 30 mg via INTRAVENOUS
  Filled 2018-05-27: qty 1

## 2018-05-27 MED ORDER — ONDANSETRON HCL 4 MG/2ML IJ SOLN
4.0000 mg | Freq: Once | INTRAMUSCULAR | Status: AC
  Administered 2018-05-27: 4 mg via INTRAVENOUS
  Filled 2018-05-27: qty 2

## 2018-05-27 MED ORDER — SODIUM CHLORIDE 0.9 % IV BOLUS
500.0000 mL | Freq: Once | INTRAVENOUS | Status: AC
  Administered 2018-05-27: 500 mL via INTRAVENOUS

## 2018-05-27 MED ORDER — HYDROMORPHONE HCL 1 MG/ML IJ SOLN
1.0000 mg | Freq: Once | INTRAMUSCULAR | Status: AC
Start: 2018-05-27 — End: 2018-05-27
  Administered 2018-05-27: 1 mg via INTRAVENOUS
  Filled 2018-05-27: qty 1

## 2018-05-27 MED ORDER — ACETAMINOPHEN 325 MG PO TABS
650.0000 mg | ORAL_TABLET | Freq: Once | ORAL | Status: AC
  Administered 2018-05-27: 650 mg via ORAL
  Filled 2018-05-27: qty 2

## 2018-05-27 NOTE — ED Notes (Signed)
Pt. Complains of nausea, lightheadedness during seated portion of orthostatic vitals.  Pt. could not stand without assistance.

## 2018-05-27 NOTE — ED Triage Notes (Addendum)
Per EMS, pt from home. Pt reports cp radiates to rt shoulder that started about an hour and a half ago. Pt reports onset of nausea and headache with it. Pt reports waking up this morning with tremors, unsteadiness, and lightheadedness in which she almost fell but caught herself and bruised rt knee. Pt denies blood thinners. EMS gave 324 asa, 22m morphine and 2 nitros which increased the headache but relieved some cp. Hx of TIAs, no deficits noted.   EMS VS BP 142/116, P 107, R 20, SpO2 95%. CBG 139.

## 2018-05-27 NOTE — ED Notes (Signed)
EMS IV removed by patient in lobby. Dressing applied by EMT.

## 2018-05-27 NOTE — ED Provider Notes (Signed)
Patient placed in Quick Look pathway, seen and evaluated   Chief Complaint: Chest pain  HPI:   Pt complains of chest pain since this am.  Pt reports she has also felt off balance   ROS: headache, nausea   Physical Exam:   Gen: No distress  Neuro: Awake and Alert  Skin: Warm    Focused Exam: Lungs clear Heart RRR    Initiation of care has begun. The patient has been counseled on the process, plan, and necessity for staying for the completion/evaluation, and the remainder of the medical screening examination   Sidney Ace 05/27/18 1716    Elnora Morrison, MD 06/01/18 1538

## 2018-05-27 NOTE — ED Notes (Signed)
Patient transported to CT 

## 2018-05-28 MED ORDER — HYDROCODONE-ACETAMINOPHEN 5-325 MG PO TABS: 1.0000 | ORAL_TABLET | Freq: Four times a day (QID) | ORAL | 0 refills | Status: DC | PRN

## 2018-05-28 MED ORDER — ONDANSETRON 4 MG PO TBDP: ORAL_TABLET | ORAL | 0 refills | Status: DC

## 2018-05-28 NOTE — ED Notes (Signed)
Patient verbalizes understanding of discharge instructions. Opportunity for questioning and answers were provided. Armband removed by staff, pt discharged from ED in wheelchair.  

## 2018-05-28 NOTE — Discharge Instructions (Addendum)
Follow-up with your doctor this week for recheck. 

## 2018-06-03 DIAGNOSIS — F329 Major depressive disorder, single episode, unspecified: Secondary | ICD-10-CM | POA: Diagnosis not present

## 2018-06-03 DIAGNOSIS — N390 Urinary tract infection, site not specified: Secondary | ICD-10-CM | POA: Diagnosis not present

## 2018-06-03 DIAGNOSIS — R7989 Other specified abnormal findings of blood chemistry: Secondary | ICD-10-CM | POA: Diagnosis not present

## 2018-06-03 DIAGNOSIS — R42 Dizziness and giddiness: Secondary | ICD-10-CM | POA: Diagnosis not present

## 2018-06-04 DIAGNOSIS — R42 Dizziness and giddiness: Secondary | ICD-10-CM | POA: Diagnosis not present

## 2018-06-04 DIAGNOSIS — N390 Urinary tract infection, site not specified: Secondary | ICD-10-CM | POA: Diagnosis not present

## 2018-06-05 DIAGNOSIS — Z1211 Encounter for screening for malignant neoplasm of colon: Secondary | ICD-10-CM | POA: Diagnosis not present

## 2018-06-09 DIAGNOSIS — R42 Dizziness and giddiness: Secondary | ICD-10-CM | POA: Diagnosis not present

## 2018-06-27 DIAGNOSIS — M2550 Pain in unspecified joint: Secondary | ICD-10-CM | POA: Diagnosis not present

## 2018-06-27 DIAGNOSIS — E669 Obesity, unspecified: Secondary | ICD-10-CM | POA: Diagnosis not present

## 2018-06-27 DIAGNOSIS — L4050 Arthropathic psoriasis, unspecified: Secondary | ICD-10-CM | POA: Diagnosis not present

## 2018-06-27 DIAGNOSIS — E1340 Other specified diabetes mellitus with diabetic neuropathy, unspecified: Secondary | ICD-10-CM | POA: Diagnosis not present

## 2018-06-27 DIAGNOSIS — Z6835 Body mass index (BMI) 35.0-35.9, adult: Secondary | ICD-10-CM | POA: Diagnosis not present

## 2018-06-27 DIAGNOSIS — M25552 Pain in left hip: Secondary | ICD-10-CM | POA: Diagnosis not present

## 2018-06-27 DIAGNOSIS — Z79899 Other long term (current) drug therapy: Secondary | ICD-10-CM | POA: Diagnosis not present

## 2018-06-27 DIAGNOSIS — L408 Other psoriasis: Secondary | ICD-10-CM | POA: Diagnosis not present

## 2018-07-04 DIAGNOSIS — M545 Low back pain: Secondary | ICD-10-CM | POA: Diagnosis not present

## 2018-07-04 DIAGNOSIS — F330 Major depressive disorder, recurrent, mild: Secondary | ICD-10-CM | POA: Diagnosis not present

## 2018-07-04 DIAGNOSIS — Z6835 Body mass index (BMI) 35.0-35.9, adult: Secondary | ICD-10-CM | POA: Diagnosis not present

## 2018-07-04 DIAGNOSIS — E785 Hyperlipidemia, unspecified: Secondary | ICD-10-CM | POA: Diagnosis not present

## 2018-07-04 DIAGNOSIS — E119 Type 2 diabetes mellitus without complications: Secondary | ICD-10-CM | POA: Diagnosis not present

## 2018-07-04 DIAGNOSIS — I10 Essential (primary) hypertension: Secondary | ICD-10-CM | POA: Diagnosis not present

## 2018-07-04 DIAGNOSIS — M1990 Unspecified osteoarthritis, unspecified site: Secondary | ICD-10-CM | POA: Diagnosis not present

## 2018-07-15 ENCOUNTER — Ambulatory Visit: Payer: Self-pay | Admitting: Endocrinology

## 2018-07-15 ENCOUNTER — Ambulatory Visit: Payer: Self-pay | Admitting: Gastroenterology

## 2018-07-17 ENCOUNTER — Ambulatory Visit: Payer: Self-pay | Admitting: Endocrinology

## 2018-07-17 DIAGNOSIS — Z0289 Encounter for other administrative examinations: Secondary | ICD-10-CM

## 2018-07-24 ENCOUNTER — Encounter: Payer: Self-pay | Admitting: Endocrinology

## 2018-07-24 ENCOUNTER — Ambulatory Visit (INDEPENDENT_AMBULATORY_CARE_PROVIDER_SITE_OTHER): Payer: Medicare PPO | Admitting: Endocrinology

## 2018-07-24 VITALS — BP 126/80 | HR 77 | Ht 60.0 in | Wt 176.2 lb

## 2018-07-24 DIAGNOSIS — I10 Essential (primary) hypertension: Secondary | ICD-10-CM | POA: Diagnosis not present

## 2018-07-24 DIAGNOSIS — Z23 Encounter for immunization: Secondary | ICD-10-CM | POA: Diagnosis not present

## 2018-07-24 DIAGNOSIS — E1142 Type 2 diabetes mellitus with diabetic polyneuropathy: Secondary | ICD-10-CM | POA: Diagnosis not present

## 2018-07-24 DIAGNOSIS — E1149 Type 2 diabetes mellitus with other diabetic neurological complication: Secondary | ICD-10-CM | POA: Diagnosis not present

## 2018-07-24 DIAGNOSIS — R945 Abnormal results of liver function studies: Secondary | ICD-10-CM | POA: Diagnosis not present

## 2018-07-24 DIAGNOSIS — F329 Major depressive disorder, single episode, unspecified: Secondary | ICD-10-CM | POA: Diagnosis not present

## 2018-07-24 DIAGNOSIS — E785 Hyperlipidemia, unspecified: Secondary | ICD-10-CM | POA: Diagnosis not present

## 2018-07-24 DIAGNOSIS — F411 Generalized anxiety disorder: Secondary | ICD-10-CM | POA: Diagnosis not present

## 2018-07-24 DIAGNOSIS — R42 Dizziness and giddiness: Secondary | ICD-10-CM | POA: Diagnosis not present

## 2018-07-24 DIAGNOSIS — Z794 Long term (current) use of insulin: Secondary | ICD-10-CM

## 2018-07-24 LAB — POCT GLYCOSYLATED HEMOGLOBIN (HGB A1C): HEMOGLOBIN A1C: 7.2 % — AB (ref 4.0–5.6)

## 2018-07-24 NOTE — Progress Notes (Signed)
Subjective:    Patient ID: Carrie [name], female    DOB: Sep 17, 1956, 62 y.o.   MRN: [id number]  HPI Pt returns for f/u of diabetes mellitus: DM type: Insulin-requiring type 2 Dx'ed: 2010. Complications: painful polyneuropathy, renal insuff, and gastroparesis.  Therapy: insulin since 2011, and metformin.   GDM: 2000.  DKA: never. Severe hypoglycemia: never.  Pancreatitis: never.  Other: she did not tolerate invokana (vaginitis); she was on multiple daily injections in the past, but could not remember to take; metformin dosage is limited by nausea; psoriasis raises possibility she is evolving type 1 DM;  She wants to add more non-insulin rx, rather than increasing insulin.   Interval history: no cbg record, but states cbg's vary from 90-180.   pt states she feels well in general.  She never misses the medications.   Past Medical History:  Diagnosis Date  . Anxiety   . Arthritis    psoriatic arithritis, DDD  . Asthma   . Depression   . Diabetes mellitus   . Gastritis   . Gastroparesis   . GERD (gastroesophageal reflux disease)   . Hernia   . Hyperlipemia   . Hyperlipemia   . Hypertension   . Neuropathy    Bil feet re: to Diabetes  . Obesity   . Osteoporosis   . Plantar fasciitis   . Stricture and stenosis of esophagus   . TIA (transient ischemic attack)   . Tuberculosis    tested positive 2011, no symptoms, was on medicine for 6 months    Past Surgical History:  Procedure Laterality Date  . BREAST SURGERY     biopsies both breast  . CARDIAC CATHETERIZATION    . CESAREAN SECTION     x 2   . JOINT REPLACEMENT     bilat knee   . KNEE SURGERY     Bilateral  replacement    Social History   Socioeconomic History  . Marital status: Single    Spouse name: Not on file  . Number of children: 2  . Years of education: Not on file  . Highest education level: Not on file  Occupational History  . Occupation: Disabled   Social Needs  . Financial resource strain: Not  on file  . Food insecurity:    Worry: Not on file    Inability: Not on file  . Transportation needs:    Medical: Not on file    Non-medical: Not on file  Tobacco Use  . Smoking status: Never Smoker  . Smokeless tobacco: Never Used  Substance and Sexual Activity  . Alcohol use: Yes    Comment: socially  . Drug use: No  . Sexual activity: Not on file  Lifestyle  . Physical activity:    Days per week: Not on file    Minutes per session: Not on file  . Stress: Not on file  Relationships  . Social connections:    Talks on phone: Not on file    Gets together: Not on file    Attends religious service: Not on file    Active member of club or organization: Not on file    Attends meetings of clubs or organizations: Not on file    Relationship status: Not on file  . Intimate partner violence:    Fear of current or ex partner: Not on file    Emotionally abused: Not on file    Physically abused: Not on file    Forced sexual activity: Not  on file  Other Topics Concern  . Not on file  Social History Narrative   [place]od[place] daily     Current Outpatient Medications on File Prior to Visit  Medication Sig Dispense Refill  . adalimumab (HUMIRA) 40 MG/0.8ML injection Inject 40 mg into the skin once a week. Sundays.    Marland Kitchen albuterol (PROAIR HFA) 108 (90 BASE) MCG/ACT inhaler Inhale 2 puffs into the lungs every 4 (four) hours as needed for wheezing or shortness of breath.    Marland Kitchen albuterol (PROVENTIL) (2.5 MG/3ML) 0.083% nebulizer solution Take 2.5 mg by nebulization every 4 (four) hours as needed for wheezing or shortness of breath.     Marland Kitchen amLODipine (NORVASC) 5 MG tablet Take 1 tablet (5 mg total) by mouth daily. 30 tablet 0  . aspirin EC 81 MG tablet Take 81 mg by mouth every morning.    . Calcium Carbonate-Vitamin D 600-400 MG-UNIT per tablet Take 1 tablet by mouth daily.    Marland Kitchen diltiazem (DILACOR XR) 180 MG 24 hr capsule Take 180 mg by mouth daily.    Marland Kitchen esomeprazole (NEXIUM) 40 MG capsule Take 40 mg  by mouth 3 (three) times daily.     . folic acid (FOLVITE) 1 MG tablet Take 1 mg by mouth every morning.     . gabapentin (NEURONTIN) 300 MG capsule Take 300 mg by mouth 3 (three) times daily.   5  . glucose blood (ACCU-CHEK AVIVA) test strip 1 each by Other route 2 (two) times daily. And lancets 2/day 100 each 12  . insulin glargine (LANTUS) 100 UNIT/ML injection Inject 0.5 mLs (50 Units total) into the skin every morning. And syringes 1/day 30 mL 11  . losartan (COZAAR) 50 MG tablet Take by mouth 2 (two) times daily. 100 mg in the morning 50 mg at bedtime.    . lovastatin (MEVACOR) 20 MG tablet Take 2 tablets (40 mg total) by mouth daily at 6 PM. 30 tablet 0  . memantine (NAMENDA) 5 MG tablet Take 5 mg by mouth 2 (two) times daily.    . metFORMIN (GLUCOPHAGE) 1000 MG tablet Take two tablets (2000 mg) daily with breakfast. 180 tablet 3  . methotrexate (RHEUMATREX) 2.5 MG tablet Take 25 mg by mouth once a week. Caution:Chemotherapy. Protect from light.  Taken on Wednesdays.    . ondansetron (ZOFRAN ODT) 4 MG disintegrating tablet 52m ODT q4 hours prn nausea/vomit 10 tablet 0  . pravastatin (PRAVACHOL) 20 MG tablet Take 20 mg by mouth daily.    . primidone (MYSOLINE) 50 MG tablet Take 250 mg by mouth 3 (three) times daily.     . sitaGLIPtin (JANUVIA) 100 MG tablet Take 1 tablet (100 mg total) by mouth daily. 30 tablet 11  . zoledronic acid (RECLAST) 5 MG/100ML SOLN Inject 5 mg into the vein. Received yearly dose 06/11/13    . buPROPion (WELLBUTRIN) 75 MG tablet Take 75 mg by mouth 3 (three) times daily.      No current facility-administered medications on file prior to visit.     Allergies  Allergen Reactions  . Lisinopril Cough    Family History  Problem Relation Age of Onset  . Diabetes Unknown        maternal aunts and uncles  . Diabetes Father   . Liver disease Father   . Emphysema Father        smoked  . Diabetes Sister   . Diabetes Brother   . Sarcoidosis Brother   . Emphysema  Mother  smoked  . Asthma Mother   . Asthma Brother   . Colon cancer Neg Hx   . Esophageal cancer Neg Hx   . Stomach cancer Neg Hx   . Rectal cancer Neg Hx     BP 126/80 (BP Location: Left Arm)   Pulse 77   Ht 5' (1.524 m)   Wt 176 lb 3.2 oz (79.9 kg)   SpO2 96%   BMI 34.41 kg/m    Review of Systems She denies hypoglycemia.      Objective:   Physical Exam VITAL SIGNS:  See vs page GENERAL: no distress Pulses: foot pulses are intact bilaterally.   MSK: no deformity of the feet or ankles.  CV: no edema of the legs or ankles Skin:  no ulcer on the feet or ankles.  normal color and temp on the feet and ankles.   Neuro: sensation is intact to touch on the feet and ankles.    Lab Results  Component Value Date   HGBA1C 7.2 (A) 07/24/2018   Lab Results  Component Value Date   CREATININE 1.28 (H) 05/27/2018   BUN 13 05/27/2018   NA 139 05/27/2018   K 4.5 05/27/2018   CL 106 05/27/2018   CO2 20 (L) 05/27/2018       Assessment & Plan:  Insulin-requiring type 2 DM, with gastroparesis: this is the best control this pt should aim for, given this regimen, which does match insulin to her changing needs throughout the day Renal insuff: in this context, pt is at risk for nocturnal hypoglycemia.  Patient Instructions  Please continue the same medications.   check your blood sugar twice a day.  vary the time of day when you check, between before the 3 meals, and at bedtime.  also check if you have symptoms of your blood sugar being too high or too low.  please keep a record of the readings and bring it to your next appointment here (or you can bring the meter itself).  You can write it on any piece of paper.  please call us sooner if your blood sugar goes below 70, or if you have a lot of readings over 200.   Please come back for a follow-up appointment in 4 months.

## 2018-07-24 NOTE — Patient Instructions (Addendum)
Please continue the same medications.   check your blood sugar twice a day.  vary the time of day when you check, between before the 3 meals, and at bedtime.  also check if you have symptoms of your blood sugar being too high or too low.  please keep a record of the readings and bring it to your next appointment here (or you can bring the meter itself).  You can write it on any piece of paper.  please call us sooner if your blood sugar goes below 70, or if you have a lot of readings over 200.   Please come back for a follow-up appointment in 4 months.

## 2018-08-06 DIAGNOSIS — R413 Other amnesia: Secondary | ICD-10-CM | POA: Diagnosis not present

## 2018-08-06 DIAGNOSIS — G250 Essential tremor: Secondary | ICD-10-CM | POA: Diagnosis not present

## 2018-08-11 DIAGNOSIS — E119 Type 2 diabetes mellitus without complications: Secondary | ICD-10-CM | POA: Diagnosis not present

## 2018-08-11 DIAGNOSIS — H40033 Anatomical narrow angle, bilateral: Secondary | ICD-10-CM | POA: Diagnosis not present

## 2018-08-15 ENCOUNTER — Telehealth: Payer: Self-pay

## 2018-08-15 ENCOUNTER — Encounter: Payer: Self-pay | Admitting: Gastroenterology

## 2018-08-15 ENCOUNTER — Ambulatory Visit (INDEPENDENT_AMBULATORY_CARE_PROVIDER_SITE_OTHER): Payer: Medicare PPO | Admitting: Gastroenterology

## 2018-08-15 VITALS — BP 138/78 | Ht 60.0 in | Wt 178.4 lb

## 2018-08-15 DIAGNOSIS — R198 Other specified symptoms and signs involving the digestive system and abdomen: Secondary | ICD-10-CM

## 2018-08-15 DIAGNOSIS — K529 Noninfective gastroenteritis and colitis, unspecified: Secondary | ICD-10-CM | POA: Diagnosis not present

## 2018-08-15 DIAGNOSIS — R1310 Dysphagia, unspecified: Secondary | ICD-10-CM | POA: Diagnosis not present

## 2018-08-15 DIAGNOSIS — R1084 Generalized abdominal pain: Secondary | ICD-10-CM | POA: Diagnosis not present

## 2018-08-15 DIAGNOSIS — R1319 Other dysphagia: Secondary | ICD-10-CM

## 2018-08-15 MED ORDER — PEG-KCL-NACL-NASULF-NA ASC-C 140 G PO SOLR: 140.0000 g | ORAL | 0 refills | Status: DC

## 2018-08-15 NOTE — Patient Instructions (Addendum)
If you are age 61 or older, your body mass index should be between 23-30. Your Body mass index is 34.84 kg/m. If this is out of the aforementioned range listed, please consider follow up with your Primary Care Provider.  If you are age 58 or younger, your body mass index should be between 19-25. Your Body mass index is 34.84 kg/m. If this is out of the aformentioned range listed, please consider follow up with your Primary Care Provider.   You have been scheduled for an endoscopy and colonoscopy. Please follow the written instructions given to you at your visit today. Please pick up your prep supplies at the pharmacy within the next 1-3 days. If you use inhalers (even only as needed), please bring them with you on the day of your procedure. Your physician has requested that you go to www.startemmi.com and enter the access code given to you at your visit today. This web site gives a general overview about your procedure. However, you should still follow specific instructions given to you by our office regarding your preparation for the procedure.  You will be contacted by our office prior to your procedure for directions on holding your diabetic medications.  If you do not hear from our office 1 week prior to your scheduled procedure, please call [phone] to discuss.    It was a pleasure to see you today!  Dr. Loletha Carrow

## 2018-08-15 NOTE — Progress Notes (Signed)
[facility] Consult Note:  History: Carrie [name] 08/15/2018  Referring physician: Jamesetta Orleans, PA-C  Reason for consult/chief complaint: Diarrhea (Soon after eating. Does not make it to restroom at times); Dysphagia (with solids and liquids); and Abdominal Pain (lower abdominal)   Subjective  HPI:  This is a pleasant 62 year old woman referred by primary care for chronic abdominal pain, diarrhea and dysphagia.  She was previously seen by Dr. Deatra Ina and had not upper endoscopy in March 2015 with a mild distal esophageal ring, 52 French bougie dilation performed.  Carrie [name] seems to recall that the dysphagia improved after that.  She had an upper endoscopy in 2014 showing mild esophagitis, and the report indicates she was having chronic nausea and vomiting.  She believes she was diagnosed with gastroparesis, though gastric emptying studies in 2014 and 2012 were normal.  She apparently was prescribed metoclopramide which seemed to help with symptoms, and then she felt she no longer needed it.  The symptoms she has now are different than that, described as generalized abdominal bloating and distention with sometimes sharp left lower quadrant/pelvic pain and urgency for bowel movements.  She will often have the need for bowel movement very soon after eating, and sometimes that relieves some of the discomfort or bloating.  Also has return of solid food dysphagia which occurs at times unpredictably.  She denies rectal bleeding.  ROS:  Review of Systems  Constitutional: Positive for fatigue. Negative for appetite change and unexpected weight change.  HENT: Negative for mouth sores and voice change.   Eyes: Negative for pain and redness.  Respiratory: Negative for cough and shortness of breath.   Cardiovascular: Negative for chest pain and palpitations.  Genitourinary: Negative for dysuria and hematuria.  Musculoskeletal: Positive for arthralgias and joint swelling.  Negative for myalgias.  Skin: Negative for pallor and rash.  Neurological: Negative for weakness and headaches.  Hematological: Negative for adenopathy.  Psychiatric/Behavioral: Positive for dysphoric mood.     Past Medical History: Past Medical History:  Diagnosis Date  . Anxiety   . Arthritis    psoriatic arithritis, DDD  . Asthma   . Depression   . Diabetes mellitus   . Gastritis   . Gastroparesis   . GERD (gastroesophageal reflux disease)   . Hernia   . Hyperlipemia   . Hyperlipemia   . Hypertension   . Neuropathy    Bil feet re: to Diabetes  . Obesity   . Osteoporosis   . Plantar fasciitis   . Stricture and stenosis of esophagus   . TIA (transient ischemic attack)   . Tuberculosis    tested positive 2011, no symptoms, was on medicine for 6 months     Past Surgical History: Past Surgical History:  Procedure Laterality Date  . BREAST SURGERY     biopsies both breast  . CARDIAC CATHETERIZATION    . CESAREAN SECTION     x 2   . JOINT REPLACEMENT     bilat knee   . KNEE SURGERY     Bilateral  replacement     Family History: Family History  Problem Relation Age of Onset  . Diabetes Unknown        maternal aunts and uncles  . Diabetes Father   . Liver disease Father   . Emphysema Father        smoked  . Diabetes Sister   . Diabetes Brother   . Sarcoidosis Brother   . Emphysema Mother  smoked  . Asthma Mother   . Asthma Brother   . Colon cancer Neg Hx   . Esophageal cancer Neg Hx   . Stomach cancer Neg Hx   . Rectal cancer Neg Hx     Social History: Social History   Socioeconomic History  . Marital status: Single    Spouse name: Not on file  . Number of children: 2  . Years of education: Not on file  . Highest education level: Not on file  Occupational History  . Occupation: Disabled   Social Needs  . Financial resource strain: Not on file  . Food insecurity:    Worry: Not on file    Inability: Not on file  . Transportation  needs:    Medical: Not on file    Non-medical: Not on file  Tobacco Use  . Smoking status: Never Smoker  . Smokeless tobacco: Never Used  Substance and Sexual Activity  . Alcohol use: Yes    Comment: socially  . Drug use: No  . Sexual activity: Not on file  Lifestyle  . Physical activity:    Days per week: Not on file    Minutes per session: Not on file  . Stress: Not on file  Relationships  . Social connections:    Talks on phone: Not on file    Gets together: Not on file    Attends religious service: Not on file    Active member of club or organization: Not on file    Attends meetings of clubs or organizations: Not on file    Relationship status: Not on file  Other Topics Concern  . Not on file  Social History Narrative   Sodas daily     Allergies: Allergies  Allergen Reactions  . Lisinopril Cough    Outpatient Meds: Current Outpatient Medications  Medication Sig Dispense Refill  . adalimumab (HUMIRA) 40 MG/0.8ML injection Inject 40 mg into the skin once a week. Sundays.    Marland Kitchen albuterol (PROAIR HFA) 108 (90 BASE) MCG/ACT inhaler Inhale 2 puffs into the lungs every 4 (four) hours as needed for wheezing or shortness of breath.    Marland Kitchen amLODipine (NORVASC) 5 MG tablet Take 1 tablet (5 mg total) by mouth daily. 30 tablet 0  . aspirin EC 81 MG tablet Take 81 mg by mouth every morning.    Marland Kitchen BUPROPION HCL PO Take 50 mg by mouth 3 (three) times daily.     . busPIRone (BUSPAR) 5 MG tablet Take 5 mg by mouth 2 (two) times daily.    . Calcium Carbonate-Vitamin D 600-400 MG-UNIT per tablet Take 1 tablet by mouth daily.    Marland Kitchen diltiazem (DILACOR XR) 180 MG 24 hr capsule Take 180 mg by mouth daily.    Marland Kitchen esomeprazole (NEXIUM) 40 MG capsule Take 40 mg by mouth daily.     . folic acid (FOLVITE) 1 MG tablet Take 1 mg by mouth 3 (three) times daily.     Marland Kitchen gabapentin (NEURONTIN) 300 MG capsule Take 300 mg by mouth 3 (three) times daily.   5  . glucose blood (ACCU-CHEK AVIVA) test strip 1  each by Other route 2 (two) times daily. And lancets 2/day 100 each 12  . insulin glargine (LANTUS) 100 UNIT/ML injection Inject 0.5 mLs (50 Units total) into the skin every morning. And syringes 1/day 30 mL 11  . losartan (COZAAR) 100 MG tablet Take 100 mg by mouth daily.     Marland Kitchen lovastatin (MEVACOR) 20 MG  tablet Take 2 tablets (40 mg total) by mouth daily at 6 PM. 30 tablet 0  . memantine (NAMENDA) 10 MG tablet Take 5 mg by mouth 2 (two) times daily.     . metFORMIN (GLUCOPHAGE) 1000 MG tablet Take two tablets (2000 mg) daily with breakfast. 180 tablet 3  . methotrexate 50 MG/2ML injection Inject 50 mg/m2 into the skin once a week. Inject 0.8 ml into skin    . pravastatin (PRAVACHOL) 20 MG tablet Take 20 mg by mouth daily.    . primidone (MYSOLINE) 250 MG tablet Take 250 mg by mouth 2 (two) times daily.     . sertraline (ZOLOFT) 50 MG tablet Take 50 mg by mouth daily.    . sitaGLIPtin (JANUVIA) 100 MG tablet Take 1 tablet (100 mg total) by mouth daily. 30 tablet 11  . PEG-KCl-NaCl-NaSulf-Na Asc-C (PLENVU) 140 g SOLR Take 140 g by mouth as directed. 1 each 0   No current facility-administered medications for this visit.       ___________________________________________________________________ Objective   Exam:  BP 138/78   Ht 5' (1.524 m)   Wt 178 lb 6.4 oz (80.9 kg)   BMI 34.84 kg/m    General: this is a(n) well-appearing patient, ambulatory and conversational  Eyes: sclera anicteric, no redness  ENT: oral mucosa moist without lesions, no cervical or supraclavicular lymphadenopathy, good dentition  CV: RRR without murmur, S1/S2, no JVD, no peripheral edema  Resp: clear to auscultation bilaterally, normal RR and effort noted  GI: soft, generalized tenderness, with active bowel sounds. No guarding or palpable organomegaly noted.  Skin; warm and dry, no rash or jaundice noted  Neuro: awake, alert and oriented x 3. Normal gross motor function and fluent speech  Labs:  CBC  Latest Ref Rng & Units 05/27/2018 09/30/2015 09/30/2015  WBC 4.0 - 10.5 K/uL 3.9(L) 9.6 11.5(H)  Hemoglobin 12.0 - 15.0 g/dL 11.6(L) 13.5 13.0  Hematocrit 36.0 - 46.0 % 35.6(L) 40.9 39.1  Platelets 150 - 400 K/uL 83(L) 164 163   CMP Latest Ref Rng & Units 05/27/2018 09/30/2015 09/30/2015  Glucose 70 - 99 mg/dL 146(H) 84 -  BUN 6 - 20 mg/dL 13 14 -  Creatinine 0.44 - 1.00 mg/dL 1.28(H) 0.98 0.87  Sodium 135 - 145 mmol/L 139 143 -  Potassium 3.5 - 5.1 mmol/L 4.5 4.0 -  Chloride 98 - 111 mmol/L 106 108 -  CO2 22 - 32 mmol/L 20(L) 28 -  Calcium 8.9 - 10.3 mg/dL 8.3(L) 9.0 -  Total Protein 6.5 - 8.1 g/dL - 7.4 -  Total Bilirubin 0.3 - 1.2 mg/dL - 1.0 -  Alkaline Phos 38 - 126 U/L - 48 -  AST 15 - 41 U/L - 25 -  ALT 14 - 54 U/L - 16 -   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  . Generalized abdominal pain Yes  . Chronic diarrhea   . Tenesmus   . Esophageal dysphagia     Possible IBS, small bowel bacterial overgrowth, celiac sprue, IBD or microscopic colitis.  Consider esophageal stricture versus motility disorder.  Sound like in the past she had more prominent nausea and vomiting that has since subsided.  Plan:  As needed Imodium Upper endoscopy with possible dilation and colonoscopy.  She is agreeable after discussion of procedure and risks.  The benefits and risks of the planned procedure were described in detail with the patient or (when appropriate) their health care proxy.  Risks  were outlined as including, but not limited to, bleeding, infection, perforation, adverse medication reaction leading to cardiac or pulmonary decompensation, or pancreatitis (if ERCP).  The limitation of incomplete mucosal visualization was also discussed.  No guarantees or warranties were given.  Caution with polypectomy due to thrombocytopenia.  Thank you for the courtesy of this consult.  Please call me with any questions or concerns.  Nelida Meuse III  CC: Jamesetta Orleans, PA-C

## 2018-08-15 NOTE — Telephone Encounter (Signed)
Dr Loanne Drilling,   This patient is scheduled for a colon/EGD on 10-03-2018 @ 1030am . She is on a oral and insulin dependent regimen. She will be on a clear liquid diet, day before and day of the procedure. Please advise on medication instructions.

## 2018-08-19 NOTE — Telephone Encounter (Signed)
Take just 25 units the day prior to colonoscopy.  Skip the insulin the morning of colonoscopy.  Resume at 25 units after the colonoscopy.  Day after, resume 50 units.  No need to adjust januvia.

## 2018-08-20 NOTE — Telephone Encounter (Signed)
Pt has been notified and aware. Printed out the phone note for her records. Mailed them to her home address.

## 2018-09-01 DIAGNOSIS — Z803 Family history of malignant neoplasm of breast: Secondary | ICD-10-CM | POA: Diagnosis not present

## 2018-09-01 DIAGNOSIS — Z1231 Encounter for screening mammogram for malignant neoplasm of breast: Secondary | ICD-10-CM | POA: Diagnosis not present

## 2018-09-22 DIAGNOSIS — M545 Low back pain: Secondary | ICD-10-CM | POA: Diagnosis not present

## 2018-09-22 DIAGNOSIS — Z79899 Other long term (current) drug therapy: Secondary | ICD-10-CM | POA: Diagnosis not present

## 2018-09-22 DIAGNOSIS — L4050 Arthropathic psoriasis, unspecified: Secondary | ICD-10-CM | POA: Diagnosis not present

## 2018-09-22 DIAGNOSIS — E669 Obesity, unspecified: Secondary | ICD-10-CM | POA: Diagnosis not present

## 2018-09-22 DIAGNOSIS — Z6834 Body mass index (BMI) 34.0-34.9, adult: Secondary | ICD-10-CM | POA: Diagnosis not present

## 2018-09-22 DIAGNOSIS — L408 Other psoriasis: Secondary | ICD-10-CM | POA: Diagnosis not present

## 2018-09-22 DIAGNOSIS — M2550 Pain in unspecified joint: Secondary | ICD-10-CM | POA: Diagnosis not present

## 2018-09-30 DIAGNOSIS — R413 Other amnesia: Secondary | ICD-10-CM | POA: Diagnosis not present

## 2018-09-30 DIAGNOSIS — E1142 Type 2 diabetes mellitus with diabetic polyneuropathy: Secondary | ICD-10-CM | POA: Diagnosis not present

## 2018-09-30 DIAGNOSIS — G249 Dystonia, unspecified: Secondary | ICD-10-CM | POA: Diagnosis not present

## 2018-09-30 DIAGNOSIS — R251 Tremor, unspecified: Secondary | ICD-10-CM | POA: Diagnosis not present

## 2018-10-01 DIAGNOSIS — E1149 Type 2 diabetes mellitus with other diabetic neurological complication: Secondary | ICD-10-CM | POA: Diagnosis not present

## 2018-10-01 DIAGNOSIS — R251 Tremor, unspecified: Secondary | ICD-10-CM | POA: Diagnosis not present

## 2018-10-02 ENCOUNTER — Emergency Department (HOSPITAL_COMMUNITY): Payer: Medicare PPO

## 2018-10-02 ENCOUNTER — Other Ambulatory Visit: Payer: Self-pay

## 2018-10-02 ENCOUNTER — Telehealth: Payer: Self-pay | Admitting: Gastroenterology

## 2018-10-02 ENCOUNTER — Emergency Department (HOSPITAL_COMMUNITY)
Admission: EM | Admit: 2018-10-02 | Discharge: 2018-10-02 | Disposition: A | Discharge status: Home / Self Care | Payer: Medicare PPO | Attending: Emergency Medicine | Admitting: Emergency Medicine

## 2018-10-02 ENCOUNTER — Encounter (HOSPITAL_COMMUNITY): Payer: Self-pay | Admitting: Emergency Medicine

## 2018-10-02 DIAGNOSIS — Z794 Long term (current) use of insulin: Secondary | ICD-10-CM | POA: Diagnosis not present

## 2018-10-02 DIAGNOSIS — R51 Headache: Secondary | ICD-10-CM | POA: Insufficient documentation

## 2018-10-02 DIAGNOSIS — R531 Weakness: Secondary | ICD-10-CM | POA: Insufficient documentation

## 2018-10-02 DIAGNOSIS — E785 Hyperlipidemia, unspecified: Secondary | ICD-10-CM | POA: Insufficient documentation

## 2018-10-02 DIAGNOSIS — Z8673 Personal history of transient ischemic attack (TIA), and cerebral infarction without residual deficits: Secondary | ICD-10-CM | POA: Insufficient documentation

## 2018-10-02 DIAGNOSIS — J45909 Unspecified asthma, uncomplicated: Secondary | ICD-10-CM | POA: Insufficient documentation

## 2018-10-02 DIAGNOSIS — R29898 Other symptoms and signs involving the musculoskeletal system: Secondary | ICD-10-CM

## 2018-10-02 DIAGNOSIS — Z79899 Other long term (current) drug therapy: Secondary | ICD-10-CM | POA: Diagnosis not present

## 2018-10-02 DIAGNOSIS — E1140 Type 2 diabetes mellitus with diabetic neuropathy, unspecified: Secondary | ICD-10-CM | POA: Diagnosis not present

## 2018-10-02 DIAGNOSIS — I10 Essential (primary) hypertension: Secondary | ICD-10-CM | POA: Diagnosis not present

## 2018-10-02 DIAGNOSIS — R200 Anesthesia of skin: Secondary | ICD-10-CM | POA: Diagnosis not present

## 2018-10-02 DIAGNOSIS — Z7982 Long term (current) use of aspirin: Secondary | ICD-10-CM | POA: Diagnosis not present

## 2018-10-02 LAB — COMPREHENSIVE METABOLIC PANEL
ALK PHOS: 59 U/L (ref 38–126)
ALT: 20 U/L (ref 0–44)
AST: 30 U/L (ref 15–41)
Albumin: 4.1 g/dL (ref 3.5–5.0)
Anion gap: 11 (ref 5–15)
BILIRUBIN TOTAL: 0.7 mg/dL (ref 0.3–1.2)
BUN: 11 mg/dL (ref 8–23)
CALCIUM: 9 mg/dL (ref 8.9–10.3)
CO2: 23 mmol/L (ref 22–32)
Chloride: 105 mmol/L (ref 98–111)
Creatinine, Ser: 1.08 mg/dL — ABNORMAL HIGH (ref 0.44–1.00)
GFR calc Af Amer: >60 mL/min (ref >60)
GFR, EST NON AFRICAN AMERICAN: 55 mL/min — AB (ref >60)
Glucose, Bld: 95 mg/dL (ref 70–99)
Potassium: 4.2 mmol/L (ref 3.5–5.1)
Sodium: 139 mmol/L (ref 135–145)
Total Protein: 8 g/dL (ref 6.5–8.1)

## 2018-10-02 LAB — DIFFERENTIAL
Abs Immature Granulocytes: 0.01 10*3/uL (ref 0.00–0.07)
Basophils Absolute: 0.1 10*3/uL (ref 0.0–0.1)
Basophils Relative: 1 %
Eosinophils Absolute: 0.2 10*3/uL (ref 0.0–0.5)
Eosinophils Relative: 3 %
Immature Granulocytes: 0 %
LYMPHS ABS: 2.7 10*3/uL (ref 0.7–4.0)
LYMPHS PCT: 38 %
Monocytes Absolute: 0.4 10*3/uL (ref 0.1–1.0)
Monocytes Relative: 5 %
Neutro Abs: 3.8 10*3/uL (ref 1.7–7.7)
Neutrophils Relative %: 53 %

## 2018-10-02 LAB — CBC
HEMATOCRIT: 42.4 % (ref 36.0–46.0)
Hemoglobin: 13.7 g/dL (ref 12.0–15.0)
MCH: 31 pg (ref 26.0–34.0)
MCHC: 32.3 g/dL (ref 30.0–36.0)
MCV: 95.9 fL (ref 80.0–100.0)
Platelets: 170 10*3/uL (ref 150–400)
RBC: 4.42 MIL/uL (ref 3.87–5.11)
RDW: 12.4 % (ref 11.5–15.5)
WBC: 7.1 10*3/uL (ref 4.0–10.5)
nRBC: 0 % (ref 0.0–0.2)

## 2018-10-02 LAB — PROTIME-INR
INR: 1.07
Prothrombin Time: 13.8 seconds (ref 11.4–15.2)

## 2018-10-02 LAB — I-STAT CHEM 8, ED
BUN: 13 mg/dL (ref 8–23)
Calcium, Ion: 1.15 mmol/L (ref 1.15–1.40)
Chloride: 106 mmol/L (ref 98–111)
Creatinine, Ser: 0.9 mg/dL (ref 0.44–1.00)
Glucose, Bld: 90 mg/dL (ref 70–99)
HCT: 42 % (ref 36.0–46.0)
HEMOGLOBIN: 14.3 g/dL (ref 12.0–15.0)
Potassium: 4.1 mmol/L (ref 3.5–5.1)
Sodium: 141 mmol/L (ref 135–145)
TCO2: 26 mmol/L (ref 22–32)

## 2018-10-02 LAB — APTT: aPTT: 54 seconds — ABNORMAL HIGH (ref 24–36)

## 2018-10-02 LAB — I-STAT TROPONIN, ED: Troponin i, poc: 0 ng/mL (ref 0.00–0.08)

## 2018-10-02 LAB — ETHANOL

## 2018-10-02 NOTE — ED Notes (Signed)
Patient transported to MRI 

## 2018-10-02 NOTE — Telephone Encounter (Signed)
Attempted to return call to patient. No answer. Will try again shortly.

## 2018-10-02 NOTE — ED Triage Notes (Signed)
Pt arrived to ED from home. Pt received Botox on Tuesday in the right arm for tremors. Pt states yesterday morning her right hand started feeling numb and her right index and ring finger are immovable. Pt states her right shoulder is painful and the back of her head is painful starting at 11am.

## 2018-10-02 NOTE — Telephone Encounter (Signed)
Pt advised okay to take Glucophage and Januvia the evening prior to procedure and hold the day of the procedure. She does not take Lantus in the evening, she know to hold her Januvia the day of the procedure. No further questions.

## 2018-10-02 NOTE — ED Notes (Signed)
Patient verbalizes understanding of discharge instructions. Opportunity for questioning and answers were provided. Armband removed by staff, pt discharged from ED via wheelchair to home.

## 2018-10-02 NOTE — ED Provider Notes (Signed)
[facility] DEPARTMENT Provider Note   CSN: [id number] Arrival date & time: 10/02/18  1446     History   Chief Complaint No chief complaint on file.   HPI Carrie [name] is a 62 y.o. female.  HPI   She presents for evaluation of difficulty using her right hand, which she first noticed yesterday morning when she was putting on her make-up.  Subsequent to that she saw her neurologist, who had done a Botox injection of her right forearm, 2 days ago.  He felt that she had a hematoma associated with the injection on the dorsal forearm, that may be causing some difficulty moving the fingers of her right hand.  He felt like it would tend to improve with time.  The patient had a Botox injection, of the right forearm, for "tremor of the right hand."  She has bilateral hand tremors.  At this time she feels like she cannot lift her right fourth finger, or flex any of the fingers of her right hand.  She also feels like she has difficulty lifting her right arm and has noticed some pain in her right shoulder, and her lower neck as well as the back of her head.  These pains were noticed yesterday and persisted today.  She denies blurred vision, focal weakness, dizziness, nausea, vomiting, chest pain or shortness of breath.  She states she has has a history of "mini strokes," and she was treated with aspirin, for these.  There are no other known modifying factors.  Past Medical History:  Diagnosis Date  . Anxiety   . Arthritis    psoriatic arithritis, DDD  . Asthma   . Depression   . Diabetes mellitus   . Gastritis   . Gastroparesis   . GERD (gastroesophageal reflux disease)   . Hernia   . Hyperlipemia   . Hyperlipemia   . Hypertension   . Neuropathy    Bil feet re: to Diabetes  . Obesity   . Osteoporosis   . Plantar fasciitis   . Stricture and stenosis of esophagus   . TIA (transient ischemic attack)   . Tuberculosis    tested positive 2011, no symptoms, was  on medicine for 6 months    Patient Active Problem List   Diagnosis Date Noted  . Psoriasis 07/08/2017  . Diabetes ([place]) 02/16/2016  . Chest pain 12/04/2013  . Diabetes mellitus type II, uncontrolled ([place]) 12/04/2013  . HTN (hypertension) 12/04/2013  . Hypoglycemia 12/04/2013  . Pyelonephritis 10/10/2013  . UTI (lower urinary tract infection) 10/10/2013  . HBP (high blood pressure) 04/21/2013  . Gastroparesis 02/25/2013  . Cough 02/25/2013  . Nausea and vomiting 01/06/2013  . Nausea alone 05/15/2011  . STRICTURE AND STENOSIS OF ESOPHAGUS 10/21/2009  . Hyperlipidemia 01/09/2008  . OBESITY 01/09/2008  . Asthma 01/09/2008  . GERD 01/09/2008  . HERNIA, VENTRAL 01/09/2008  . ARTHRITIS 01/09/2008  . Depression 03/25/2007  . ANXIETY 03/25/2007    Past Surgical History:  Procedure Laterality Date  . BREAST SURGERY     biopsies both breast  . CARDIAC CATHETERIZATION    . CESAREAN SECTION     x 2   . JOINT REPLACEMENT     bilat knee   . KNEE SURGERY     Bilateral  replacement     OB History   No obstetric history on file.      Home Medications    Prior to Admission medications   Medication Sig Start  Date End Date Taking? Authorizing Provider  adalimumab (HUMIRA) 40 MG/0.8ML injection Inject 40 mg into the skin once a week. Sundays.    [provider]  albuterol (PROAIR HFA) 108 (90 BASE) MCG/ACT inhaler Inhale 2 puffs into the lungs every 4 (four) hours as needed for wheezing or shortness of breath.    [provider]  amLODipine (NORVASC) 5 MG tablet Take 1 tablet (5 mg total) by mouth daily. 09/30/15   Dhungel, Flonnie Overman, MD  aspirin EC 81 MG tablet Take 81 mg by mouth every morning.    [provider]  BUPROPION HCL PO Take 50 mg by mouth 3 (three) times daily.  05/21/16 08/15/18  [provider]  busPIRone (BUSPAR) 5 MG tablet Take 5 mg by mouth 2 (two) times daily.    [provider]  Calcium Carbonate-Vitamin D 600-400  MG-UNIT per tablet Take 1 tablet by mouth daily. 04/26/15   [provider]  diltiazem (DILACOR XR) 180 MG 24 hr capsule Take 180 mg by mouth daily.    [provider]  esomeprazole (NEXIUM) 40 MG capsule Take 40 mg by mouth daily.     [provider]  folic acid (FOLVITE) 1 MG tablet Take 1 mg by mouth 3 (three) times daily.     [provider]  gabapentin (NEURONTIN) 300 MG capsule Take 300 mg by mouth 3 (three) times daily.  04/26/15   [provider]  glucose blood (ACCU-CHEK AVIVA) test strip 1 each by Other route 2 (two) times daily. And lancets 2/day 05/09/18   Renato Shin, MD  insulin glargine (LANTUS) 100 UNIT/ML injection Inject 0.5 mLs (50 Units total) into the skin every morning. And syringes 1/day 05/09/18   Renato Shin, MD  losartan (COZAAR) 100 MG tablet Take 100 mg by mouth daily.     [provider]  lovastatin (MEVACOR) 20 MG tablet Take 2 tablets (40 mg total) by mouth daily at 6 PM. 09/30/15   Dhungel, Nishant, MD  memantine (NAMENDA) 10 MG tablet Take 5 mg by mouth 2 (two) times daily.     [provider]  metFORMIN (GLUCOPHAGE) 1000 MG tablet Take two tablets (2000 mg) daily with breakfast. 12/27/17   Renato Shin, MD  methotrexate 50 MG/2ML injection Inject 50 mg/m2 into the skin once a week. Inject 0.8 ml into skin    [provider]  PEG-KCl-NaCl-NaSulf-Na Asc-C (PLENVU) 140 g SOLR Take 140 g by mouth as directed. 08/15/18   Doran Stabler, MD  pravastatin (PRAVACHOL) 20 MG tablet Take 20 mg by mouth daily.    [provider]  primidone (MYSOLINE) 250 MG tablet Take 250 mg by mouth 2 (two) times daily.     [provider]  sertraline (ZOLOFT) 50 MG tablet Take 50 mg by mouth daily.    [provider]  sitaGLIPtin (JANUVIA) 100 MG tablet Take 1 tablet (100 mg total) by mouth daily. 05/09/18   Renato Shin, MD    Family History Family History  Problem Relation Age of Onset   . Diabetes Other        maternal aunts and uncles  . Diabetes Father   . Liver disease Father   . Emphysema Father        smoked  . Diabetes Sister   . Diabetes Brother   . Sarcoidosis Brother   . Emphysema Mother        smoked  . Asthma Mother   . Asthma Brother   .  Colon cancer Neg Hx   . Esophageal cancer Neg Hx   . Stomach cancer Neg Hx   . Rectal cancer Neg Hx     Social History Social History   Tobacco Use  . Smoking status: Never Smoker  . Smokeless tobacco: Never Used  Substance Use Topics  . Alcohol use: Yes    Comment: socially  . Drug use: No     Allergies   Lisinopril   Review of Systems Review of Systems  All other systems reviewed and are negative.    Physical Exam Updated Vital Signs BP (!) 141/80   Pulse 80   Temp 98.5 F (36.9 C) (Oral)   Resp 19   Ht 5' (1.524 m)   Wt 80.3 kg   SpO2 96%   BMI 34.57 kg/m   Physical Exam Vitals signs and nursing note reviewed.  Constitutional:      Appearance: Normal appearance. She is well-developed.  HENT:     Head: Normocephalic and atraumatic.     Right Ear: External ear normal.     Left Ear: External ear normal.  Eyes:     Conjunctiva/sclera: Conjunctivae normal.     Pupils: Pupils are equal, round, and reactive to light.  Neck:     Musculoskeletal: Normal range of motion and neck supple.     Trachea: Phonation normal.  Cardiovascular:     Rate and Rhythm: Normal rate and regular rhythm.     Heart sounds: Normal heart sounds.  Pulmonary:     Effort: Pulmonary effort is normal.     Breath sounds: Normal breath sounds.  Abdominal:     Palpations: Abdomen is soft.     Tenderness: There is no abdominal tenderness.  Musculoskeletal: Normal range of motion.        General: No swelling, tenderness, deformity or signs of injury.     Comments: Small amount of bruising and mid dorsal forearm consistent with site of recent Botox injection.  Skin:    General: Skin is warm and dry.    Neurological:     Mental Status: She is alert and oriented to person, place, and time.     Cranial Nerves: No cranial nerve deficit.     Sensory: No sensory deficit.     Motor: No abnormal muscle tone.     Coordination: Coordination normal.     Comments: No dysarthria, aphasia or nystagmus.  Abnormal extension right finger 4.  Abnormal flexion all fingers of right hand.  With movement of the right hand, right wrist and right forearm, she demonstrates ballistic movements.  She is unable to hold fingers or wrist in extension or flexion without relaxing.  At rest she has a mild tremor of both hands.  Psychiatric:        Behavior: Behavior normal.        Thought Content: Thought content normal.        Judgment: Judgment normal.     Comments: Anxious      ED Treatments / Results  Labs (all labs ordered are listed, but only abnormal results are displayed) Labs Reviewed  APTT - Abnormal; Notable for the following components:      Result Value   aPTT 54 (*)    All other components within normal limits  COMPREHENSIVE METABOLIC PANEL - Abnormal; Notable for the following components:   Creatinine, Ser 1.08 (*)    GFR calc non Af Amer 55 (*)    All other components within normal limits  ETHANOL  PROTIME-INR  CBC  DIFFERENTIAL  RAPID URINE DRUG SCREEN, HOSP PERFORMED  URINALYSIS, ROUTINE W REFLEX MICROSCOPIC  I-STAT CHEM 8, ED  I-STAT TROPONIN, ED    EKG EKG Interpretation  Date/Time:  Thursday October 02 2018 14:56:20 EST Ventricular Rate:  87 PR Interval:    QRS Duration: 82 QT Interval:  368 QTC Calculation: 443 R Axis:   53 Text Interpretation:  Sinus rhythm Since last tracing rate slower Confirmed by Daleen Bo [phone]) on 10/02/2018 4:40:45 PM   Radiology Mr Brain Wo Contrast  Result Date: 10/02/2018 CLINICAL DATA:  62 y/o F; Botox injection to the right arm for tremors on Tuesday. Patient has developed right hand numbness, right index and ring finger immobility,  and pain into the right shoulder as well as back of head. EXAM: MRI HEAD WITHOUT CONTRAST TECHNIQUE: Multiplanar, multiecho pulse sequences of the brain and surrounding structures were obtained without intravenous contrast. COMPARISON:  05/27/2018 CT head.  06/25/2015 MRI head. FINDINGS: Brain: No acute infarction, hemorrhage, hydrocephalus, extra-axial collection or mass lesion. Few stable nonspecific T2 FLAIR hyperintensities in subcortical and periventricular white matter are compatible with mild chronic microvascular ischemic changes for age. Stable mild volume loss of the brain. Vascular: Normal flow voids. Skull and upper cervical spine: Normal marrow signal. Sinuses/Orbits: Mild mucosal thickening of the paranasal sinuses. No abnormal signal of the mastoid air cells. Orbits are unremarkable. Other: None. IMPRESSION: 1. No acute intracranial abnormality identified. 2. Stable mild chronic microvascular ischemic changes and volume loss of the brain. Electronically Signed   By: Kristine Garbe M.D.   On: 10/02/2018 19:09    Procedures Procedures (including critical care time)  Medications Ordered in ED Medications - No data to display   Initial Impression / Assessment and Plan / ED Course  I have reviewed the triage vital signs and the nursing notes.  Pertinent labs & imaging results that were available during my care of the patient were reviewed by me and considered in my medical decision making (see chart for details).  Clinical Course as of Oct 02 2042  Thu Oct 02, 2018  1605 Patient has new symptoms are possibly related to a posterior circulation brain stroke, which started yesterday.  It is unlikely that a Botox injection of her forearm, is causing the constellation of symptoms today.  MRI ordered to evaluate for CVA.   [EW]  2036 Normal  I-Stat Chem 8, ED [EW]  2036 Normal  Differential [EW]  2036 Normal  Protime-INR [EW]  2036 Normal except creatinine slightly elevated    Comprehensive metabolic panel(!) [EW]  7062 Normal  I-stat troponin, ED [EW]  2036 Normal  CBC [EW]  2037 No acute CVA, or intracranial mass.  MR BRAIN WO CONTRAST [EW]    Clinical Course User Index [EW] Daleen Bo, MD     Patient Vitals for the past 24 hrs:  BP Temp Temp src Pulse Resp SpO2 Height Weight  10/02/18 1751 - - - 80 19 96 % - -  10/02/18 1745 (!) 141/80 - - 73 18 93 % - -  10/02/18 1502 - - - - - - 5' (1.524 m) 80.3 kg  10/02/18 1457 (!) 152/96 98.5 F (36.9 C) Oral 86 18 99 % - -    8:41 PM Reevaluation with update and discussion. After initial assessment and treatment, an updated evaluation reveals no change in clinical status.  Findings discussed with the patient and a female with her, all questions were answered. Daleen Bo  Medical Decision Making: Right hand weakness following injection of Botox to treat right hand tremor.  No evidence for central or large nerve disorder causing right hand weakness.  Therefore the weakness is likely related to the Botox injection.  Doubt CVA, cervical radiculopathy or brachial plexopathy.  CRITICAL CARE-no Performed by: Daleen Bo   Nursing Notes Reviewed/ Care Coordinated Applicable Imaging Reviewed Interpretation of Laboratory Data incorporated into ED treatment  The patient appears reasonably screened and/or stabilized for discharge and I doubt any other medical condition or other [facility] requiring further screening, evaluation, or treatment in the ED at this time prior to discharge.  Plan: Home Medications-continue usual medications; Home Treatments-gradually advance activity, try heat on forearm at the site of injection, to assist with recovery; return here if the recommended treatment, does not improve the symptoms; Recommended follow up-neurology, call in morning for appointment.     Final Clinical Impressions(s) / ED Diagnoses   Final diagnoses:  Right hand weakness    ED Discharge Orders    None        Daleen Bo, MD 10/02/18 2043

## 2018-10-02 NOTE — Telephone Encounter (Signed)
Patient wants to speak to nurse regarding procedure scheduled tomorrow

## 2018-10-02 NOTE — Telephone Encounter (Signed)
Patient has questions regarding medications she can take before [facility] tomorrow.

## 2018-10-02 NOTE — Discharge Instructions (Addendum)
The testing today does not indicate that you have had a stroke.  It appears that your hand difficulty is related to the Botox injection.  Follow-up with the neurologist that did the Botox injection for ongoing care and treatment.  Call him in the morning to arrange follow-up care.

## 2018-10-02 NOTE — Telephone Encounter (Signed)
Called patient at home and she explains that she went to the Neurologist yesterday for a botox injection in her arm for tremors. Afterwards she explains that she was having problems with two  fingers on that same arm yesterday and when she woke up this morning she feels like her shoulder is just "hanging." She said she thinks she may be having a mini stroke because she has had them before. I advised her to go to the ER immediately and that I would cancel this appointment for tomorrow. I also told her she can call back when she wants to reschedule.

## 2018-10-03 ENCOUNTER — Encounter: Payer: Self-pay | Admitting: Gastroenterology

## 2018-10-03 DIAGNOSIS — R251 Tremor, unspecified: Secondary | ICD-10-CM | POA: Diagnosis not present

## 2018-10-14 DIAGNOSIS — M25641 Stiffness of right hand, not elsewhere classified: Secondary | ICD-10-CM | POA: Diagnosis not present

## 2018-10-16 DIAGNOSIS — E119 Type 2 diabetes mellitus without complications: Secondary | ICD-10-CM | POA: Diagnosis not present

## 2018-10-16 DIAGNOSIS — M25641 Stiffness of right hand, not elsewhere classified: Secondary | ICD-10-CM | POA: Diagnosis not present

## 2018-10-16 DIAGNOSIS — R251 Tremor, unspecified: Secondary | ICD-10-CM | POA: Diagnosis not present

## 2018-10-16 DIAGNOSIS — G5631 Lesion of radial nerve, right upper limb: Secondary | ICD-10-CM | POA: Diagnosis not present

## 2018-10-16 DIAGNOSIS — Z794 Long term (current) use of insulin: Secondary | ICD-10-CM | POA: Diagnosis not present

## 2018-10-20 ENCOUNTER — Other Ambulatory Visit: Payer: Self-pay | Admitting: Endocrinology

## 2018-10-27 ENCOUNTER — Telehealth: Payer: Self-pay | Admitting: Gastroenterology

## 2018-10-27 NOTE — Telephone Encounter (Signed)
Pt calling to r/s endo colon that had to be cancelled this past December due to pt going to the hospital as she thought that she was having a stroke. Pt states that she had a botox injection that caused some side effects, hospital ruled out stroke. Please advise if it is ok to r/s procedure or if pt needs an ov.

## 2018-10-28 NOTE — Telephone Encounter (Signed)
See note below and advise. 

## 2018-10-28 NOTE — Telephone Encounter (Signed)
Pt does not need an OV, see note below from Dr. Loletha Carrow.

## 2018-10-28 NOTE — Telephone Encounter (Signed)
She can be rescheduled for [facility] without office visit. Thanks for checking.  - HD

## 2018-10-29 ENCOUNTER — Encounter: Payer: Self-pay | Admitting: Gastroenterology

## 2018-10-29 NOTE — Telephone Encounter (Signed)
Left message to call back to r/s endo colon.

## 2018-11-04 DIAGNOSIS — I10 Essential (primary) hypertension: Secondary | ICD-10-CM | POA: Diagnosis not present

## 2018-11-04 DIAGNOSIS — N183 Chronic kidney disease, stage 3 (moderate): Secondary | ICD-10-CM | POA: Diagnosis not present

## 2018-11-06 DIAGNOSIS — N183 Chronic kidney disease, stage 3 (moderate): Secondary | ICD-10-CM | POA: Diagnosis not present

## 2018-11-06 DIAGNOSIS — I10 Essential (primary) hypertension: Secondary | ICD-10-CM | POA: Diagnosis not present

## 2018-11-06 DIAGNOSIS — N390 Urinary tract infection, site not specified: Secondary | ICD-10-CM | POA: Diagnosis not present

## 2018-11-07 DIAGNOSIS — G5631 Lesion of radial nerve, right upper limb: Secondary | ICD-10-CM | POA: Diagnosis not present

## 2018-11-07 DIAGNOSIS — M19021 Primary osteoarthritis, right elbow: Secondary | ICD-10-CM | POA: Diagnosis not present

## 2018-11-12 DIAGNOSIS — M25521 Pain in right elbow: Secondary | ICD-10-CM | POA: Diagnosis not present

## 2018-11-12 DIAGNOSIS — G5691 Unspecified mononeuropathy of right upper limb: Secondary | ICD-10-CM | POA: Diagnosis not present

## 2018-11-12 DIAGNOSIS — M79641 Pain in right hand: Secondary | ICD-10-CM | POA: Diagnosis not present

## 2018-11-13 ENCOUNTER — Telehealth: Payer: Self-pay | Admitting: *Deleted

## 2018-11-13 NOTE — Telephone Encounter (Signed)
Pt cancelled appointments.

## 2018-11-13 NOTE — Telephone Encounter (Signed)
Patient no show PV today. Called pt, no answer. Left message for her to call us back before 5 pm today to reschedule the pv or the colon/EGD will be cancelled.

## 2018-11-18 ENCOUNTER — Other Ambulatory Visit: Payer: Self-pay

## 2018-11-18 ENCOUNTER — Ambulatory Visit (AMBULATORY_SURGERY_CENTER): Payer: Self-pay | Admitting: *Deleted

## 2018-11-18 VITALS — Ht 60.0 in | Wt 178.8 lb

## 2018-11-18 DIAGNOSIS — R1319 Other dysphagia: Secondary | ICD-10-CM

## 2018-11-18 DIAGNOSIS — R197 Diarrhea, unspecified: Secondary | ICD-10-CM

## 2018-11-18 DIAGNOSIS — R1310 Dysphagia, unspecified: Secondary | ICD-10-CM

## 2018-11-18 DIAGNOSIS — R1030 Lower abdominal pain, unspecified: Secondary | ICD-10-CM

## 2018-11-18 NOTE — Progress Notes (Signed)
Patient denies any allergies to egg or soy products. Patient denies complications with anesthesia/sedation.  Patient denies oxygen use at home and denies diet medications. Patient denies information on colonoscopy.

## 2018-11-24 ENCOUNTER — Encounter: Payer: Self-pay | Admitting: Endocrinology

## 2018-11-24 ENCOUNTER — Other Ambulatory Visit: Payer: Self-pay

## 2018-11-24 ENCOUNTER — Ambulatory Visit (INDEPENDENT_AMBULATORY_CARE_PROVIDER_SITE_OTHER): Payer: Medicare PPO | Admitting: Endocrinology

## 2018-11-24 VITALS — BP 130/72 | HR 80 | Temp 98.1°F | Resp 16 | Ht 60.0 in | Wt 176.4 lb

## 2018-11-24 DIAGNOSIS — Z794 Long term (current) use of insulin: Secondary | ICD-10-CM

## 2018-11-24 DIAGNOSIS — E1142 Type 2 diabetes mellitus with diabetic polyneuropathy: Secondary | ICD-10-CM | POA: Diagnosis not present

## 2018-11-24 LAB — POCT GLYCOSYLATED HEMOGLOBIN (HGB A1C): Hemoglobin A1C: 6.5 % — AB (ref 4.0–5.6)

## 2018-11-24 MED ORDER — SITAGLIPTIN PHOSPHATE 100 MG PO TABS: 100.0000 mg | ORAL_TABLET | Freq: Every day | ORAL | 3 refills | Status: DC

## 2018-11-24 MED ORDER — INSULIN GLARGINE 100 UNIT/ML ~~LOC~~ SOLN: 40.0000 [IU] | SUBCUTANEOUS | 11 refills | Status: DC

## 2018-11-24 NOTE — Progress Notes (Signed)
Subjective:    Patient ID: Carrie [name], female    DOB: 09-May-1956, 63 y.o.   MRN: [id number]  HPI Pt returns for f/u of diabetes mellitus: DM type: Insulin-requiring type 2 Dx'ed: 2010. Complications: painful polyneuropathy, renal insuff, and gastroparesis.  Therapy: insulin since 2011, and 2 oral meds GDM: 2000.  DKA: never. Severe hypoglycemia: never.  Pancreatitis: never.  Other: she did not tolerate invokana (vaginitis); she was on multiple daily injections in the past, but could not remember to take; metformin dosage is limited by nausea; psoriasis raises possibility she is evolving type 1 DM;  She wants to add more non-insulin rx, rather than increasing insulin.   Interval history: Meter is downloaded today, and the printout is scanned into the record.  It varies from 107-201.   pt states she feels well in general.  She never misses the medications.   Past Medical History:  Diagnosis Date  . Anxiety   . Arthritis    psoriatic arithritis, DDD   . Asthma   . Depression   . Diabetes mellitus   . Gastritis   . Gastroparesis   . GERD (gastroesophageal reflux disease)   . Hernia   . Hyperlipemia   . Hyperlipemia   . Hypertension   . Neuropathy    Bil feet re: to Diabetes  . Obesity   . Osteoporosis   . Plantar fasciitis   . Stricture and stenosis of esophagus   . TIA (transient ischemic attack)    8-10 yrs ago, no problems since  . Tuberculosis    tested positive 2011, no symptoms, was on medicine for 6 months    Past Surgical History:  Procedure Laterality Date  . BREAST SURGERY Bilateral    biopsies both breast  . CARDIAC CATHETERIZATION    . CESAREAN SECTION     x 2   . COLONOSCOPY    . JOINT REPLACEMENT     bilat knee   . KNEE SURGERY Bilateral    x 2 joint knee replacements  . UPPER GASTROINTESTINAL ENDOSCOPY    . WISDOM TOOTH EXTRACTION      Social History   Socioeconomic History  . Marital status: Single    Spouse name: Not on file  . Number  of children: 2  . Years of education: Not on file  . Highest education level: Not on file  Occupational History  . Occupation: Disabled   Social Needs  . Financial resource strain: Not on file  . Food insecurity:    Worry: Not on file    Inability: Not on file  . Transportation needs:    Medical: Not on file    Non-medical: Not on file  Tobacco Use  . Smoking status: Never Smoker  . Smokeless tobacco: Never Used  Substance and Sexual Activity  . Alcohol use: Not Currently    Comment: socially  . Drug use: No  . Sexual activity: Not Currently    Birth control/protection: Post-menopausal  Lifestyle  . Physical activity:    Days per week: Not on file    Minutes per session: Not on file  . Stress: Not on file  Relationships  . Social connections:    Talks on phone: Not on file    Gets together: Not on file    Attends religious service: Not on file    Active member of club or organization: Not on file    Attends meetings of clubs or organizations: Not on file    Relationship  status: Not on file  . Intimate partner violence:    Fear of current or ex partner: Not on file    Emotionally abused: Not on file    Physically abused: Not on file    Forced sexual activity: Not on file  Other Topics Concern  . Not on file  Social History Narrative   [place] daily     Current Outpatient Medications on File Prior to Visit  Medication Sig Dispense Refill  . adalimumab (HUMIRA) 40 MG/0.8ML injection Inject 40 mg into the skin once a week. Sundays.    Marland Kitchen albuterol (PROAIR HFA) 108 (90 BASE) MCG/ACT inhaler Inhale 2 puffs into the lungs every 4 (four) hours as needed for wheezing or shortness of breath.    Marland Kitchen amLODipine (NORVASC) 5 MG tablet Take 1 tablet (5 mg total) by mouth daily. 30 tablet 0  . aspirin EC 81 MG tablet Take 81 mg by mouth every morning.    . busPIRone (BUSPAR) 5 MG tablet Take 5 mg by mouth 2 (two) times daily.    . Calcium Carbonate-Vitamin D 600-400 MG-UNIT per tablet  Take 1 tablet by mouth daily.    Marland Kitchen diltiazem (DILACOR XR) 180 MG 24 hr capsule Take 180 mg by mouth daily.    Marland Kitchen esomeprazole (NEXIUM) 40 MG capsule Take 40 mg by mouth daily.     . folic acid (FOLVITE) 1 MG tablet Take 1 mg by mouth 3 (three) times daily.     Marland Kitchen gabapentin (NEURONTIN) 300 MG capsule Take 300 mg by mouth 3 (three) times daily.   5  . glucose blood (ACCU-CHEK AVIVA) test strip 1 each by Other route 2 (two) times daily. And lancets 2/day 100 each 12  . losartan (COZAAR) 100 MG tablet Take 100 mg by mouth daily.     Marland Kitchen lovastatin (MEVACOR) 20 MG tablet Take 2 tablets (40 mg total) by mouth daily at 6 PM. 30 tablet 0  . memantine (NAMENDA) 10 MG tablet Take 5 mg by mouth 2 (two) times daily.     . metFORMIN (GLUCOPHAGE) 1000 MG tablet TAKE TWO TABLETS (2000 MG) DAILY WITH BREAKFAST. 90 tablet 1  . methocarbamol (ROBAXIN) 500 MG tablet     . methotrexate 50 MG/2ML injection Inject 50 mg/m2 into the skin once a week. Inject 0.8 ml into skin on Wednesday    . PEG-KCl-NaCl-NaSulf-Na Asc-C (PLENVU) 140 g SOLR Take 140 g by mouth as directed. 1 each 0  . pravastatin (PRAVACHOL) 20 MG tablet Take 20 mg by mouth daily.    . primidone (MYSOLINE) 250 MG tablet Take 250 mg by mouth 2 (two) times daily.     . sertraline (ZOLOFT) 50 MG tablet Take 50 mg by mouth daily.    . BUPROPION HCL PO Take 50 mg by mouth 3 (three) times daily.      No current facility-administered medications on file prior to visit.     Allergies  Allergen Reactions  . Lisinopril Cough    Family History  Problem Relation Age of Onset  . Diabetes Other        maternal aunts and uncles  . Diabetes Father   . Liver disease Father   . Emphysema Father        smoked  . Diabetes Sister   . Diabetes Brother   . Sarcoidosis Brother   . Emphysema Mother        smoked  . Asthma Mother   . Asthma Brother   .  Colon cancer Neg Hx   . Esophageal cancer Neg Hx   . Stomach cancer Neg Hx   . Rectal cancer Neg Hx      BP 130/72   Pulse 80   Temp 98.1 F (36.7 C) (Oral)   Resp 16   Ht 5' (1.524 m)   Wt 176 lb 6 oz (80 kg)   LMP  (LMP Unknown)   SpO2 98%   BMI 34.45 kg/m    Review of Systems She denies hypoglycemia    Objective:   Physical Exam VITAL SIGNS:  See vs page GENERAL: no distress Pulses: dorsalis pedis intact bilat.   MSK: no deformity of the feet CV: no leg edema Skin:  no ulcer on the feet.  normal color and temp on the feet. Neuro: sensation is intact to touch on the feet  Lab Results  Component Value Date   HGBA1C 6.5 (A) 11/24/2018   Lab Results  Component Value Date   CREATININE 0.90 10/02/2018   BUN 13 10/02/2018   NA 141 10/02/2018   K 4.1 10/02/2018   CL 106 10/02/2018   CO2 23 10/02/2018       Assessment & Plan:  Insulin-requiring type 2 DM, with PN: overcontrolled, given this insulin regimen, which does match insulin to her changing needs throughout the day  Patient Instructions  Please reduce the lantus to 40 units each morning, and: continue the same other diabetes medications.   check your blood sugar twice a day.  vary the time of day when you check, between before the 3 meals, and at bedtime.  also check if you have symptoms of your blood sugar being too high or too low.  please keep a record of the readings and bring it to your next appointment here (or you can bring the meter itself).  You can write it on any piece of paper.  please call us sooner if your blood sugar goes below 70, or if you have a lot of readings over 200.   Please come back for a follow-up appointment in 4 months.

## 2018-11-24 NOTE — Patient Instructions (Signed)
Please reduce the lantus to 40 units each morning, and: continue the same other diabetes medications.   check your blood sugar twice a day.  vary the time of day when you check, between before the 3 meals, and at bedtime.  also check if you have symptoms of your blood sugar being too high or too low.  please keep a record of the readings and bring it to your next appointment here (or you can bring the meter itself).  You can write it on any piece of paper.  please call us sooner if your blood sugar goes below 70, or if you have a lot of readings over 200.   Please come back for a follow-up appointment in 4 months.

## 2018-11-27 ENCOUNTER — Encounter: Payer: Self-pay | Admitting: Gastroenterology

## 2018-11-27 ENCOUNTER — Ambulatory Visit (AMBULATORY_SURGERY_CENTER): Payer: Medicare PPO | Admitting: Gastroenterology

## 2018-11-27 VITALS — BP 133/63 | HR 74 | Temp 98.0°F | Resp 11 | Ht 60.0 in | Wt 178.0 lb

## 2018-11-27 DIAGNOSIS — K2100 Gastro-esophageal reflux disease with esophagitis, without bleeding: Secondary | ICD-10-CM

## 2018-11-27 DIAGNOSIS — R1319 Other dysphagia: Secondary | ICD-10-CM

## 2018-11-27 DIAGNOSIS — K3189 Other diseases of stomach and duodenum: Secondary | ICD-10-CM

## 2018-11-27 DIAGNOSIS — R1030 Lower abdominal pain, unspecified: Secondary | ICD-10-CM

## 2018-11-27 DIAGNOSIS — D122 Benign neoplasm of ascending colon: Secondary | ICD-10-CM | POA: Diagnosis not present

## 2018-11-27 DIAGNOSIS — R1310 Dysphagia, unspecified: Secondary | ICD-10-CM

## 2018-11-27 DIAGNOSIS — K529 Noninfective gastroenteritis and colitis, unspecified: Secondary | ICD-10-CM

## 2018-11-27 DIAGNOSIS — K5730 Diverticulosis of large intestine without perforation or abscess without bleeding: Secondary | ICD-10-CM

## 2018-11-27 MED ORDER — SODIUM CHLORIDE 0.9 % IV SOLN: 500.0000 mL | Freq: Once | INTRAVENOUS | Status: DC

## 2018-11-27 NOTE — Progress Notes (Signed)
PT taken to PACU. Monitors in place. VSS. Report given to RN. 

## 2018-11-27 NOTE — Progress Notes (Signed)
Pt's states no medical or surgical changes since previsit or office visit. 

## 2018-11-27 NOTE — Progress Notes (Signed)
Called to room to assist during endoscopic procedure.  Patient ID and intended procedure confirmed with present staff. Received instructions for my participation in the procedure from the performing physician.  

## 2018-11-27 NOTE — Op Note (Signed)
Carrie [name] Patient Name: Carrie [name] Procedure Date: 11/27/2018 10:02 AM MRN: [id number] Endoscopist: [place]. Loletha Carrow , MD Age: 63 Referring MD:  Date of Birth: [birth year] Gender: Female Account #: [id number] Procedure:                Colonoscopy Indications:              Lower abdominal pain, Clinically significant                            intermittent diarrhea of unexplained origin                            (occurring with episodic LLQ pain) Medicines:                Monitored Anesthesia Care Procedure:                Pre-Anesthesia Assessment:                           - Prior to the procedure, a History and Physical                            was performed, and patient medications and                            allergies were reviewed. The patient's tolerance of                            previous anesthesia was also reviewed. The risks                            and benefits of the procedure and the sedation                            options and risks were discussed with the patient.                            All questions were answered, and informed consent                            was obtained. Anticoagulants: The patient has taken                            aspirin. It was decided not to withhold this                            medication prior to the procedure. ASA Grade                            Assessment: II - A patient with mild systemic                            disease. After reviewing the risks and benefits,  the patient was deemed in satisfactory condition to                            undergo the procedure.                           After obtaining informed consent, the colonoscope                            was passed under direct vision. Throughout the                            procedure, the patient's blood pressure, pulse, and                            oxygen saturations were monitored continuously. The                  Colonoscope was introduced through the anus and                            advanced to the the terminal ileum, with                            identification of the appendiceal orifice and IC                            valve. The colonoscopy was performed without                            difficulty. The patient tolerated the procedure                            well. The quality of the bowel preparation was                            excellent. The terminal ileum, ileocecal valve,                            appendiceal orifice, and rectum were photographed. Scope In: 10:06:44 AM Scope Out: 10:21:07 AM Scope Withdrawal Time: 0 hours 10 minutes 13 seconds  Total Procedure Duration: 0 hours 14 minutes 23 seconds  Findings:                 The perianal and digital rectal examinations were                            normal.                           The terminal ileum appeared normal.                           Two sessile polyps were found in the ascending  colon. The polyps were diminutive in size. These                            polyps were removed with a cold biopsy forceps.                            Resection and retrieval were complete.                           Normal mucosa was found in the entire colon.                            Biopsies for histology were taken with a cold                            forceps from the right colon and left colon for                            evaluation of microscopic colitis.                           Diverticula were found in the left colon.                           The exam was otherwise without abnormality on                            direct and retroflexion views. Complications:            No immediate complications. Estimated Blood Loss:     Estimated blood loss was minimal. Impression:               - The examined portion of the ileum was normal.                           - Two diminutive polyps in  the ascending colon,                            removed with a cold biopsy forceps. Resected and                            retrieved.                           - Normal mucosa in the entire examined colon.                            Biopsied.                           - Diverticulosis in the left colon.                           - The examination was otherwise normal on direct  and retroflexion views. Recommendation:           - Patient has a contact number available for                            emergencies. The signs and symptoms of potential                            delayed complications were discussed with the                            patient. Return to normal activities tomorrow.                            Written discharge instructions were provided to the                            patient.                           - Resume previous diet.                           - Continue present medications.                           - Await pathology results.                           - Repeat colonoscopy is recommended for                            surveillance. The colonoscopy date will be                            determined after pathology results from today's                            exam become available for review.                           - See the other procedure note for documentation of                            additional recommendations. Carrie [name] L. Loletha Carrow, MD 11/27/2018 10:38:06 AM This report has been signed electronically.

## 2018-11-27 NOTE — Op Note (Signed)
[place] Patient Name: Buelah Rennie Procedure Date: 11/27/2018 10:02 AM MRN: [id number] Endoscopist: [place]. Loletha Carrow , MD Age: 63 Referring MD:  Date of Birth: 04-23-1956 Gender: Female Account #: [id number] Procedure:                Upper GI endoscopy Indications:              Epigastric abdominal pain, Esophageal dysphagia,                            Esophageal reflux symptoms that persist despite                            appropriate therapy Medicines:                Monitored Anesthesia Care Procedure:                Pre-Anesthesia Assessment:                           - Prior to the procedure, a History and Physical                            was performed, and patient medications and                            allergies were reviewed. The patient's tolerance of                            previous anesthesia was also reviewed. The risks                            and benefits of the procedure and the sedation                            options and risks were discussed with the patient.                            All questions were answered, and informed consent                            was obtained. Anticoagulants: The patient has taken                            aspirin. It was decided not to withhold this                            medication prior to the procedure. ASA Grade                            Assessment: II - A patient with mild systemic                            disease. After reviewing the risks and benefits,  the patient was deemed in satisfactory condition to                            undergo the procedure.                           After obtaining informed consent, the endoscope was                            passed under direct vision. Throughout the                            procedure, the patient's blood pressure, pulse, and                            oxygen saturations were monitored continuously. The                Endoscope was introduced through the mouth, and                            advanced to the second part of duodenum. The upper                            GI endoscopy was accomplished without difficulty.                            The patient tolerated the procedure fairly well. Scope In: Scope Out: Findings:                 The larynx was normal.                           LA Grade A (one or more mucosal breaks less than 5                            mm, not extending between tops of 2 mucosal folds)                            esophagitis with no bleeding was found at the                            gastroesophageal junction.                           There is no endoscopic evidence of stricture in the                            entire esophagus.                           Localized inflammation characterized by erosions                            and erythema was found in the prepyloric region of  the stomach. Multiple biopsies were obtained in the                            gastric body and in the gastric antrum with cold                            forceps for histology.                           The exam of the stomach was otherwise normal.                           The examined duodenum was normal. Complications:            No immediate complications. Estimated Blood Loss:     Estimated blood loss was minimal. Impression:               - Normal larynx.                           - LA Grade A reflux esophagitis. 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                           - Gastritis.                           - Normal examined duodenum.                           - Multiple biopsies were obtained in the gastric                            body and in the gastric antrum. Recommendation:           - Patient has a contact number  available for                            emergencies. The signs and symptoms of potential                            delayed complications were discussed with the                            patient. Return to normal activities tomorrow.                            Written discharge instructions were provided to the                            patient.                           -  Resume previous diet.                           - Continue present medications.                           - Await pathology results.                           - See the other procedure note for documentation of                            additional recommendations. Kaydin Labo L. Loletha Carrow, MD 11/27/2018 10:46:24 AM This report has been signed electronically.

## 2018-11-27 NOTE — Patient Instructions (Signed)
Handouts Provided:  Polyps and Gastritis  YOU HAD AN ENDOSCOPIC PROCEDURE TODAY AT [facility]:   Refer to the procedure report that was given to you for any specific questions about what was found during the examination.  If the procedure report does not answer your questions, please call your gastroenterologist to clarify.  If you requested that your care partner not be given the details of your procedure findings, then the procedure report has been included in a sealed envelope for you to review at your convenience later.  YOU SHOULD EXPECT: Some feelings of bloating in the abdomen. Passage of more gas than usual.  Walking can help get rid of the air that was put into your GI tract during the procedure and reduce the bloating. If you had a lower endoscopy (such as a colonoscopy or flexible sigmoidoscopy) you may notice spotting of blood in your stool or on the toilet paper. If you underwent a bowel prep for your procedure, you may not have a normal bowel movement for a few days.  Please Note:  You might notice some irritation and congestion in your nose or some drainage.  This is from the oxygen used during your procedure.  There is no need for concern and it should clear up in a day or so.  SYMPTOMS TO REPORT IMMEDIATELY:   Following lower endoscopy (colonoscopy or flexible sigmoidoscopy):  Excessive amounts of blood in the stool  Significant tenderness or worsening of abdominal pains  Swelling of the abdomen that is new, acute  Fever of 100F or higher   Following upper endoscopy (EGD)  Vomiting of blood or coffee ground material  New chest pain or pain under the shoulder blades  Painful or persistently difficult swallowing  New shortness of breath  Fever of 100F or higher  Black, tarry-looking stools  For urgent or emergent issues, a gastroenterologist can be reached at any hour by calling [phone].   DIET:  We do recommend a small meal at first, but then  you may proceed to your regular diet.  Drink plenty of fluids but you should avoid alcoholic beverages for 24 hours.  ACTIVITY:  You should plan to take it easy for the rest of today and you should NOT DRIVE or use heavy machinery until tomorrow (because of the sedation medicines used during the test).    FOLLOW UP: Our staff will call the number listed on your records the next business day following your procedure to check on you and address any questions or concerns that you may have regarding the information given to you following your procedure. If we do not reach you, we will leave a message.  However, if you are feeling well and you are not experiencing any problems, there is no need to return our call.  We will assume that you have returned to your regular daily activities without incident.  If any biopsies were taken you will be contacted by phone or by letter within the next 1-3 weeks.  Please call us at [phone] if you have not heard about the biopsies in 3 weeks.    SIGNATURES/CONFIDENTIALITY: You and/or your care partner have signed paperwork which will be entered into your electronic medical record.  These signatures attest to the fact that that the information above on your After Visit Summary has been reviewed and is understood.  Full responsibility of the confidentiality of this discharge information lies with you and/or your care-partner.

## 2018-11-28 ENCOUNTER — Telehealth: Payer: Self-pay | Admitting: *Deleted

## 2018-11-28 NOTE — Telephone Encounter (Signed)
  Follow up Call-  Call back number 11/27/2018  Post procedure Call Back phone  # [phone]  Permission to leave phone message Yes  Some recent data might be hidden     Patient questions:  Do you have a fever, pain , or abdominal swelling? No. Pain Score  0 *  Have you tolerated food without any problems? Yes.    Have you been able to return to your normal activities? Yes.    Do you have any questions about your discharge instructions: Diet   No. Medications  No. Follow up visit  No.  Do you have questions or concerns about your Care? No.  Actions: * If pain score is 4 or above: No action needed, pain <4.

## 2018-12-01 DIAGNOSIS — R413 Other amnesia: Secondary | ICD-10-CM | POA: Diagnosis not present

## 2018-12-01 DIAGNOSIS — R251 Tremor, unspecified: Secondary | ICD-10-CM | POA: Diagnosis not present

## 2018-12-01 DIAGNOSIS — G249 Dystonia, unspecified: Secondary | ICD-10-CM | POA: Diagnosis not present

## 2018-12-02 ENCOUNTER — Encounter: Payer: Self-pay | Admitting: Gastroenterology

## 2018-12-22 DIAGNOSIS — Z79899 Other long term (current) drug therapy: Secondary | ICD-10-CM | POA: Diagnosis not present

## 2018-12-22 DIAGNOSIS — Z6834 Body mass index (BMI) 34.0-34.9, adult: Secondary | ICD-10-CM | POA: Diagnosis not present

## 2018-12-22 DIAGNOSIS — M545 Low back pain: Secondary | ICD-10-CM | POA: Diagnosis not present

## 2018-12-22 DIAGNOSIS — L4050 Arthropathic psoriasis, unspecified: Secondary | ICD-10-CM | POA: Diagnosis not present

## 2018-12-22 DIAGNOSIS — E669 Obesity, unspecified: Secondary | ICD-10-CM | POA: Diagnosis not present

## 2018-12-22 DIAGNOSIS — M2550 Pain in unspecified joint: Secondary | ICD-10-CM | POA: Diagnosis not present

## 2018-12-22 DIAGNOSIS — L408 Other psoriasis: Secondary | ICD-10-CM | POA: Diagnosis not present

## 2018-12-24 DIAGNOSIS — G5631 Lesion of radial nerve, right upper limb: Secondary | ICD-10-CM | POA: Diagnosis not present

## 2019-01-07 ENCOUNTER — Other Ambulatory Visit: Payer: Self-pay | Admitting: Endocrinology

## 2019-01-15 ENCOUNTER — Encounter (HOSPITAL_COMMUNITY): Payer: Self-pay | Admitting: Emergency Medicine

## 2019-01-15 ENCOUNTER — Other Ambulatory Visit: Payer: Self-pay

## 2019-01-15 ENCOUNTER — Emergency Department (HOSPITAL_COMMUNITY)
Admission: EM | Admit: 2019-01-15 | Discharge: 2019-01-16 | Disposition: A | Discharge status: Home / Self Care | Payer: Medicare PPO | Attending: Emergency Medicine | Admitting: Emergency Medicine

## 2019-01-15 DIAGNOSIS — Z7982 Long term (current) use of aspirin: Secondary | ICD-10-CM | POA: Diagnosis not present

## 2019-01-15 DIAGNOSIS — T481X2A Poisoning by skeletal muscle relaxants [neuromuscular blocking agents], intentional self-harm, initial encounter: Secondary | ICD-10-CM | POA: Diagnosis not present

## 2019-01-15 DIAGNOSIS — J45909 Unspecified asthma, uncomplicated: Secondary | ICD-10-CM | POA: Insufficient documentation

## 2019-01-15 DIAGNOSIS — Z8673 Personal history of transient ischemic attack (TIA), and cerebral infarction without residual deficits: Secondary | ICD-10-CM | POA: Diagnosis not present

## 2019-01-15 DIAGNOSIS — F4325 Adjustment disorder with mixed disturbance of emotions and conduct: Secondary | ICD-10-CM | POA: Diagnosis not present

## 2019-01-15 DIAGNOSIS — I10 Essential (primary) hypertension: Secondary | ICD-10-CM | POA: Diagnosis not present

## 2019-01-15 DIAGNOSIS — R112 Nausea with vomiting, unspecified: Secondary | ICD-10-CM | POA: Diagnosis not present

## 2019-01-15 DIAGNOSIS — E119 Type 2 diabetes mellitus without complications: Secondary | ICD-10-CM | POA: Diagnosis not present

## 2019-01-15 DIAGNOSIS — R45851 Suicidal ideations: Secondary | ICD-10-CM | POA: Diagnosis not present

## 2019-01-15 DIAGNOSIS — R000 Tachycardia, unspecified: Secondary | ICD-10-CM | POA: Diagnosis not present

## 2019-01-15 DIAGNOSIS — T887XXA Unspecified adverse effect of drug or medicament, initial encounter: Secondary | ICD-10-CM | POA: Diagnosis not present

## 2019-01-15 DIAGNOSIS — R569 Unspecified convulsions: Secondary | ICD-10-CM | POA: Diagnosis not present

## 2019-01-15 DIAGNOSIS — T50902A Poisoning by unspecified drugs, medicaments and biological substances, intentional self-harm, initial encounter: Secondary | ICD-10-CM

## 2019-01-15 DIAGNOSIS — F419 Anxiety disorder, unspecified: Secondary | ICD-10-CM | POA: Diagnosis not present

## 2019-01-15 DIAGNOSIS — Z96653 Presence of artificial knee joint, bilateral: Secondary | ICD-10-CM | POA: Diagnosis not present

## 2019-01-15 DIAGNOSIS — Z79899 Other long term (current) drug therapy: Secondary | ICD-10-CM | POA: Insufficient documentation

## 2019-01-15 DIAGNOSIS — F332 Major depressive disorder, recurrent severe without psychotic features: Secondary | ICD-10-CM | POA: Diagnosis not present

## 2019-01-15 DIAGNOSIS — Z794 Long term (current) use of insulin: Secondary | ICD-10-CM | POA: Diagnosis not present

## 2019-01-15 DIAGNOSIS — F29 Unspecified psychosis not due to a substance or known physiological condition: Secondary | ICD-10-CM | POA: Diagnosis not present

## 2019-01-15 DIAGNOSIS — T428X1A Poisoning by antiparkinsonism drugs and other central muscle-tone depressants, accidental (unintentional), initial encounter: Secondary | ICD-10-CM | POA: Diagnosis not present

## 2019-01-15 DIAGNOSIS — R0902 Hypoxemia: Secondary | ICD-10-CM | POA: Diagnosis not present

## 2019-01-15 LAB — CBC WITH DIFFERENTIAL/PLATELET
Abs Immature Granulocytes: 0.02 10*3/uL (ref 0.00–0.07)
Basophils Absolute: 0.1 10*3/uL (ref 0.0–0.1)
Basophils Relative: 1 %
Eosinophils Absolute: 0.1 10*3/uL (ref 0.0–0.5)
Eosinophils Relative: 2 %
HCT: 39.5 % (ref 36.0–46.0)
Hemoglobin: 13 g/dL (ref 12.0–15.0)
Immature Granulocytes: 0 %
Lymphocytes Relative: 47 %
Lymphs Abs: 3.6 10*3/uL (ref 0.7–4.0)
MCH: 31.3 pg (ref 26.0–34.0)
MCHC: 32.9 g/dL (ref 30.0–36.0)
MCV: 95.2 fL (ref 80.0–100.0)
Monocytes Absolute: 0.3 10*3/uL (ref 0.1–1.0)
Monocytes Relative: 4 %
Neutro Abs: 3.5 10*3/uL (ref 1.7–7.7)
Neutrophils Relative %: 46 %
Platelets: 146 10*3/uL — ABNORMAL LOW (ref 150–400)
RBC: 4.15 MIL/uL (ref 3.87–5.11)
RDW: 13.2 % (ref 11.5–15.5)
WBC: 7.6 10*3/uL (ref 4.0–10.5)
nRBC: 0 % (ref 0.0–0.2)

## 2019-01-15 LAB — SALICYLATE LEVEL: Salicylate Lvl: <7 mg/dL (ref 2.8–30.0)

## 2019-01-15 LAB — COMPREHENSIVE METABOLIC PANEL
ALT: 30 U/L (ref 0–44)
AST: 38 U/L (ref 15–41)
Albumin: 4.3 g/dL (ref 3.5–5.0)
Alkaline Phosphatase: 63 U/L (ref 38–126)
Anion gap: 12 (ref 5–15)
BUN: 15 mg/dL (ref 8–23)
CO2: 21 mmol/L — ABNORMAL LOW (ref 22–32)
Calcium: 8.9 mg/dL (ref 8.9–10.3)
Chloride: 104 mmol/L (ref 98–111)
Creatinine, Ser: 0.85 mg/dL (ref 0.44–1.00)
GFR calc Af Amer: >60 mL/min (ref >60)
GFR calc non Af Amer: >60 mL/min (ref >60)
Glucose, Bld: 166 mg/dL — ABNORMAL HIGH (ref 70–99)
Potassium: 4 mmol/L (ref 3.5–5.1)
Sodium: 137 mmol/L (ref 135–145)
Total Bilirubin: 1.2 mg/dL (ref 0.3–1.2)
Total Protein: 8 g/dL (ref 6.5–8.1)

## 2019-01-15 LAB — RAPID URINE DRUG SCREEN, HOSP PERFORMED
Amphetamines: NOT DETECTED
Barbiturates: POSITIVE — AB
Benzodiazepines: NOT DETECTED
Cocaine: NOT DETECTED
Opiates: NOT DETECTED
Tetrahydrocannabinol: NOT DETECTED

## 2019-01-15 LAB — CBG MONITORING, ED: Glucose-Capillary: 123 mg/dL — ABNORMAL HIGH (ref 70–99)

## 2019-01-15 LAB — ACETAMINOPHEN LEVEL: Acetaminophen (Tylenol), Serum: <10 ug/mL — ABNORMAL LOW (ref 10–30)

## 2019-01-15 LAB — ETHANOL: Alcohol, Ethyl (B): <10 mg/dL (ref <10)

## 2019-01-15 MED ORDER — SODIUM CHLORIDE 0.9 % IV BOLUS (SEPSIS)
1000.0000 mL | Freq: Once | INTRAVENOUS | Status: AC
  Administered 2019-01-15: 1000 mL via INTRAVENOUS

## 2019-01-15 MED ORDER — PANTOPRAZOLE SODIUM 40 MG PO TBEC
40.0000 mg | DELAYED_RELEASE_TABLET | Freq: Every day | ORAL | Status: DC
  Administered 2019-01-15 – 2019-01-16 (×2): 40 mg via ORAL
  Filled 2019-01-15 (×2): qty 1

## 2019-01-15 MED ORDER — ACETAMINOPHEN 325 MG PO TABS
650.0000 mg | ORAL_TABLET | Freq: Once | ORAL | Status: AC
  Administered 2019-01-15: 650 mg via ORAL
  Filled 2019-01-15: qty 2

## 2019-01-15 MED ORDER — ONDANSETRON HCL 4 MG/2ML IJ SOLN
4.0000 mg | Freq: Once | INTRAMUSCULAR | Status: AC
  Administered 2019-01-15: 4 mg via INTRAVENOUS
  Filled 2019-01-15: qty 2

## 2019-01-15 MED ORDER — ALBUTEROL SULFATE HFA 108 (90 BASE) MCG/ACT IN AERS: 2.0000 | INHALATION_SPRAY | RESPIRATORY_TRACT | Status: DC | PRN

## 2019-01-15 MED ORDER — METFORMIN HCL 500 MG PO TABS
1000.0000 mg | ORAL_TABLET | Freq: Two times a day (BID) | ORAL | Status: DC
  Administered 2019-01-16: 1000 mg via ORAL
  Filled 2019-01-15: qty 2

## 2019-01-15 NOTE — ED Notes (Signed)
Pt with nausea and vomiting. Notified Dr. Christy Gentles of the same, order for zofran, keep npo.
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
## 2019-01-15 NOTE — ED Notes (Signed)
Pt. Given graham crackers and coke to drink.
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
## 2019-01-15 NOTE — ED Notes (Signed)
Ingestion time approximately 1440
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
## 2019-01-15 NOTE — ED Notes (Signed)
Bed: GE40 Expected date:  Expected time:  Means of arrival:  Comments: Ems methacarbamol od

## 2019-01-16 ENCOUNTER — Inpatient Hospital Stay (HOSPITAL_COMMUNITY): Admission: AD | Admit: 2019-01-16 | Payer: Medicare PPO | Source: Intra-hospital | Admitting: Psychiatry

## 2019-01-16 DIAGNOSIS — F4325 Adjustment disorder with mixed disturbance of emotions and conduct: Secondary | ICD-10-CM | POA: Diagnosis not present

## 2019-01-16 DIAGNOSIS — T481X2A Poisoning by skeletal muscle relaxants [neuromuscular blocking agents], intentional self-harm, initial encounter: Secondary | ICD-10-CM | POA: Diagnosis not present

## 2019-01-16 LAB — CBG MONITORING, ED: Glucose-Capillary: 214 mg/dL — ABNORMAL HIGH (ref 70–99)

## 2019-01-16 MED ORDER — INSULIN GLARGINE 100 UNIT/ML ~~LOC~~ SOLN
40.0000 [IU] | Freq: Every day | SUBCUTANEOUS | Status: DC
  Administered 2019-01-16: 40 [IU] via SUBCUTANEOUS
  Filled 2019-01-16: qty 0.4

## 2019-01-16 MED ORDER — LOSARTAN POTASSIUM 50 MG PO TABS
100.0000 mg | ORAL_TABLET | Freq: Every day | ORAL | Status: DC
  Administered 2019-01-16: 100 mg via ORAL
  Filled 2019-01-16: qty 2

## 2019-01-16 NOTE — Consult Note (Addendum)
[facility] Psych ED Discharge  01/16/2019 10:44 AM Carrie [name]  MRN:  [id number] Principal Problem: Adjustment disorder with mixed disturbance of emotions and conduct Discharge Diagnoses: Principal Problem:   Adjustment disorder with mixed disturbance of emotions and conduct   Subjective: Pt was seen and chart reviewed with treatment team and Dr Mariea Clonts. Pt denies suicidal/homicidal ideation, denies auditory/visual hallucinations and does not appear to be responding to internal stimuli. Pt stated she overreacted to an argument with her adult daughter. Pt is remorseful and apologetic for her actions. Pt lives with her boyfriend of 58 years and her adult son, Audelia Hives. Audelia Hives was contacted for collateral and stated he has no safety concerns for Pt's discharge. Pt's UDS was positive for barbiturates, BAL negative. Pt denies any barbiturate use and this could be a false positive result. Pt has never been hospitalized in a psychiatric facility. She is prescribed Vraylar, Zoloft and Buspar by her PCP. Pt will be provided with outpatient resources for therapy as this may help her develop healthy coping skills for her issues with her adult daughter. Pt is psychiatrically clear.   Total Time spent with patient: 30 minutes  Past Psychiatric History: Depression   Past Medical History:  Past Medical History:  Diagnosis Date  . Anxiety   . Arthritis    psoriatic arithritis, DDD   . Asthma   . Depression   . Diabetes mellitus   . Gastritis   . Gastroparesis   . GERD (gastroesophageal reflux disease)   . Hernia   . Hyperlipemia   . Hyperlipemia   . Hypertension   . Neuropathy    Bil feet re: to Diabetes  . Obesity   . Osteoporosis   . Plantar fasciitis   . Stricture and stenosis of esophagus   . TIA (transient ischemic attack)    8-10 yrs ago, no problems since  . Tuberculosis    tested positive 2011, no symptoms, was on medicine for 6 months    Past Surgical History:  Procedure Laterality Date  .  BREAST SURGERY Bilateral    biopsies both breast  . CARDIAC CATHETERIZATION    . CESAREAN SECTION     x 2   . COLONOSCOPY    . JOINT REPLACEMENT     bilat knee   . KNEE SURGERY Bilateral    x 2 joint knee replacements  . UPPER GASTROINTESTINAL ENDOSCOPY    . WISDOM TOOTH EXTRACTION     Family History:  Family History  Problem Relation Age of Onset  . Diabetes Other        maternal aunts and uncles  . Diabetes Father   . Liver disease Father   . Emphysema Father        smoked  . Diabetes Sister   . Diabetes Brother   . Sarcoidosis Brother   . Emphysema Mother        smoked  . Asthma Mother   . Asthma Brother   . Colon cancer Neg Hx   . Esophageal cancer Neg Hx   . Stomach cancer Neg Hx   . Rectal cancer Neg Hx    Family Psychiatric  History: Pt denies.  Social History:  Social History   Substance and Sexual Activity  Alcohol Use Not Currently   Comment: socially     Social History   Substance and Sexual Activity  Drug Use No    Social History   Socioeconomic History  . Marital status: Single    Spouse name: Not  on file  . Number of children: 2  . Years of education: Not on file  . Highest education level: Not on file  Occupational History  . Occupation: Disabled   Social Needs  . Financial resource strain: Not on file  . Food insecurity:    Worry: Not on file    Inability: Not on file  . Transportation needs:    Medical: Not on file    Non-medical: Not on file  Tobacco Use  . Smoking status: Never Smoker  . Smokeless tobacco: Never Used  Substance and Sexual Activity  . Alcohol use: Not Currently    Comment: socially  . Drug use: No  . Sexual activity: Not Currently    Birth control/protection: Post-menopausal  Lifestyle  . Physical activity:    Days per week: Not on file    Minutes per session: Not on file  . Stress: Not on file  Relationships  . Social connections:    Talks on phone: Not on file    Gets together: Not on file     Attends religious service: Not on file    Active member of club or organization: Not on file    Attends meetings of clubs or organizations: Not on file    Relationship status: Not on file  Other Topics Concern  . Not on file  Social History Narrative   Sodas daily     Has this patient used any form of tobacco in the last 30 days? (Cigarettes, Smokeless Tobacco, Cigars, and/or Pipes) Prescription not provided because: Pt does not use tobacco products  Current Medications: Current Facility-Administered Medications  Medication Dose Route Frequency Provider Last Rate Last Dose  . albuterol (PROVENTIL HFA;VENTOLIN HFA) 108 (90 Base) MCG/ACT inhaler 2 puff  2 puff Inhalation Q4H PRN Ripley Fraise, MD      . insulin glargine (LANTUS) injection 40 Units  40 Units Subcutaneous Daily Curatolo, Adam, DO   40 Units at 01/16/19 9562  . losartan (COZAAR) tablet 100 mg  100 mg Oral Daily Dalia Heading, PA-C   100 mg at 01/16/19 0846  . metFORMIN (GLUCOPHAGE) tablet 1,000 mg  1,000 mg Oral BID WC Ripley Fraise, MD   1,000 mg at 01/16/19 0846  . pantoprazole (PROTONIX) EC tablet 40 mg  40 mg Oral Daily Ripley Fraise, MD   40 mg at 01/16/19 1308   Current Outpatient Medications  Medication Sig Dispense Refill  . albuterol (PROAIR HFA) 108 (90 BASE) MCG/ACT inhaler Inhale 2 puffs into the lungs every 4 (four) hours as needed for wheezing or shortness of breath.    Marland Kitchen aspirin EC 81 MG tablet Take 81 mg by mouth every morning.    . busPIRone (BUSPAR) 5 MG tablet Take 5 mg by mouth at bedtime.     . Calcium Carbonate-Vitamin D 600-400 MG-UNIT per tablet Take 1 tablet by mouth daily.    Marland Kitchen diltiazem (DILACOR XR) 180 MG 24 hr capsule Take 180 mg by mouth at bedtime.     Marland Kitchen esomeprazole (NEXIUM) 40 MG capsule Take 40 mg by mouth daily.     . folic acid (FOLVITE) 1 MG tablet Take 1 mg by mouth at bedtime.     . gabapentin (NEURONTIN) 100 MG capsule Take 100 mg by mouth daily.    Marland Kitchen gabapentin  (NEURONTIN) 300 MG capsule Take 300 mg by mouth at bedtime.   5  . HUMIRA PEN 40 MG/0.4ML PNKT Inject 0.8 mLs into the skin every Wednesday.    Marland Kitchen  insulin glargine (LANTUS) 100 UNIT/ML injection Inject 0.4 mLs (40 Units total) into the skin every morning. And syringes 1/day 15 mL 11  . losartan (COZAAR) 100 MG tablet Take 100 mg by mouth daily.     . memantine (NAMENDA) 10 MG tablet Take 5 mg by mouth at bedtime.     . metFORMIN (GLUCOPHAGE) 1000 MG tablet TAKE TWO TABLETS (2000 MG) DAILY WITH BREAKFAST. (Patient taking differently: Take 1,000 mg by mouth 2 (two) times daily with a meal. ) 90 tablet 1  . methocarbamol (ROBAXIN) 500 MG tablet Take 500 mg by mouth at bedtime.     . methotrexate 50 MG/2ML injection Inject 20 mg into the skin every Wednesday.     . nystatin cream (MYCOSTATIN) Apply 1 application topically daily as needed for dry skin.     . pravastatin (PRAVACHOL) 20 MG tablet Take 20 mg by mouth daily.    . primidone (MYSOLINE) 250 MG tablet Take 250 mg by mouth daily.     . sertraline (ZOLOFT) 50 MG tablet Take 50 mg by mouth at bedtime.     . sitaGLIPtin (JANUVIA) 100 MG tablet Take 1 tablet (100 mg total) by mouth daily. (Patient taking differently: Take 100 mg by mouth at bedtime. ) 90 tablet 3  . amLODipine (NORVASC) 5 MG tablet Take 1 tablet (5 mg total) by mouth daily. (Patient not taking: Reported on 01/15/2019) 30 tablet 0  . glucose blood (ACCU-CHEK AVIVA) test strip 1 each by Other route 2 (two) times daily. And lancets 2/day 100 each 12  . lovastatin (MEVACOR) 20 MG tablet Take 2 tablets (40 mg total) by mouth daily at 6 PM. (Patient not taking: Reported on 01/15/2019) 30 tablet 0  . PEG-KCl-NaCl-NaSulf-Na Asc-C (PLENVU) 140 g SOLR Take 140 g by mouth as directed. (Patient not taking: Reported on 01/15/2019) 1 each 0   PTA Medications: (Not in a hospital admission)   Musculoskeletal: Strength & Muscle Tone: within normal limits Gait & Station: normal Patient leans:  N/A  Psychiatric Specialty Exam: Physical Exam  Nursing note and vitals reviewed. Constitutional: She is oriented to person, place, and time. She appears well-developed and well-nourished.  HENT:  Head: Normocephalic and atraumatic.  Neck: Normal range of motion.  Respiratory: Effort normal.  Musculoskeletal: Normal range of motion.  Neurological: She is alert and oriented to person, place, and time.  Psychiatric: Her speech is normal and behavior is normal. Judgment and thought content normal. Her mood appears anxious. Cognition and memory are normal. She exhibits a depressed mood.    Review of Systems  Psychiatric/Behavioral: The patient is nervous/anxious.   All other systems reviewed and are negative.   Blood pressure (!) 172/88, pulse 72, temperature 97.8 F (36.6 C), temperature source Oral, resp. rate 16, height 5' (1.524 m), weight 80.3 kg, SpO2 100 %.Body mass index is 34.57 kg/m.  General Appearance: Casual  Eye Contact:  Good  Speech:  Clear and Coherent and Normal Rate  Volume:  Normal  Mood:  Anxious  Affect:  Congruent  Thought Process:  Coherent, Goal Directed and Descriptions of Associations: Intact  Orientation:  Full (Time, Place, and Person)  Thought Content:  Logical  Suicidal Thoughts:  No  Homicidal Thoughts:  No  Memory:  Immediate;   Good Recent;   Good Remote;   Good  Judgement:  Fair  Insight:  Fair  Psychomotor Activity:  Normal  Concentration:  Concentration: Good and Attention Span: Good  Recall:  Good  Fund  of Knowledge:  Good  Language:  Good  Akathisia:  No  Handed:  Right  AIMS (if indicated):   N/A  Assets:  Museum/gallery curator Social Support Transportation  ADL's:  Intact  Cognition:  WNL  Sleep:   N/A     Demographic Factors:  Caucasian and Unemployed  Loss Factors: NA  Historical Factors: Impulsivity  Risk Reduction Factors:   Sense of responsibility to family  Continued  Clinical Symptoms:  Severe Anxiety and/or Agitation  Cognitive Features That Contribute To Risk:  Closed-mindedness    Suicide Risk:  Minimal: No identifiable suicidal ideation.  Patients presenting with no risk factors but with morbid ruminations; may be classified as minimal risk based on the severity of the depressive symptoms    Plan Of Care/Follow-up recommendations:  Activity:  as tolerated Diet:  Heart healthy  Disposition and Treatment Plan: Adjustment disorder with mixed disturbance of emotions and conduct Take all medications as prescribed by your outpatient provider. Keep all follow-up appointments as scheduled. Please make a therapy appointment with one of the outpatient providers provided to you on your discharge paperwork.   Do not consume alcohol or use illegal drugs while on prescription medications. Report any adverse effects from your medications to your primary care provider promptly.  In the event of recurrent symptoms or worsening symptoms, call 911, a crisis hotline, or go to the nearest emergency department for evaluation.   Ethelene Hal, NP 01/16/2019, 10:44 AM   Patient seen face-to-face for psychiatric evaluation, chart reviewed and case discussed with the physician extender and developed treatment plan. Reviewed the information documented and agree with the treatment plan.  Buford Dresser, DO 01/16/19 10:59 AM

## 2019-01-16 NOTE — ED Notes (Signed)
SEE NOTES

## 2019-01-16 NOTE — BH Assessment (Signed)
[facility] Assessment Progress Note This Probation officer spoke to patient's son [name] [phone] with patient's permission, to gather collateral information (safety concerns) in reference to patient possibly being discharged this date.  Derrill Center stated that he felt his mother with whom he resides, would be safe to discharge and voiced no concerns. Derrill Center reports that his mother "loves the family" and he feels she would never do anything to harm herself. Derrill Center stated he felt the specific incident that occurred prior to her arrival to ED (overdose) was not a attempt to take her own life. Derrill Center reported that he would assist in monitoring his mother once she is discharged. This information was rendered to psychiatric team.

## 2019-01-16 NOTE — Discharge Instructions (Signed)
For your behavioral health needs, you are advised to follow up with an outpatient therapist.  Contact one of the following providers at your earliest opportunity to ask about scheduling an intake appointment:       [facility] [place], [place] [address]      [phone]       [facility]      [address] [place], [place] [address]      [phone]       [facility]      [phone]      [place], [place] [address]      [phone]

## 2019-01-16 NOTE — ED Notes (Signed)
Pt DC d off unit to home per MD order. Pt DC information reviewed with pt with verbalized understanding. DC information given to the pt. Pt belongings given to pt.  Pt calm, cooperative, denied SI.HI, denied A/V hallucinations. No delusions noted. Pt ambulated off the unit escorted by RN. Pt was picked up by family member. No fall noted.

## 2019-01-16 NOTE — ED Notes (Signed)
Bed: [facility] Expected date:  Expected time:  Means of arrival:  Comments: 25

## 2019-01-16 NOTE — ED Notes (Signed)
Pt informs this nurse that she takes 40 units of Lantus q morning, last dose yesterday around 10, noted that no current order. This nurse notified EDP, awaiting orders.

## 2019-01-16 NOTE — BH Assessment (Signed)
[facility] Assessment Progress Note  Per Buford Dresser, DO, this pt does not require psychiatric hospitalization at this time.  Pt is to be discharged from [facility] with recommendation to follow up with an outpatient therapist.  Discharge instructions include referral information for the [facility], for [place], and for [place].  Pt's nurse, Caryl Pina, has been notified.  Jalene Mullet, [place] Triage Specialist [phone]

## 2019-01-23 DIAGNOSIS — Z794 Long term (current) use of insulin: Secondary | ICD-10-CM | POA: Diagnosis not present

## 2019-01-23 DIAGNOSIS — F329 Major depressive disorder, single episode, unspecified: Secondary | ICD-10-CM | POA: Diagnosis not present

## 2019-01-23 DIAGNOSIS — R809 Proteinuria, unspecified: Secondary | ICD-10-CM | POA: Diagnosis not present

## 2019-01-23 DIAGNOSIS — E1129 Type 2 diabetes mellitus with other diabetic kidney complication: Secondary | ICD-10-CM | POA: Diagnosis not present

## 2019-01-23 DIAGNOSIS — J45909 Unspecified asthma, uncomplicated: Secondary | ICD-10-CM | POA: Diagnosis not present

## 2019-01-23 DIAGNOSIS — F411 Generalized anxiety disorder: Secondary | ICD-10-CM | POA: Diagnosis not present

## 2019-01-23 DIAGNOSIS — I10 Essential (primary) hypertension: Secondary | ICD-10-CM | POA: Diagnosis not present

## 2019-01-23 DIAGNOSIS — E1149 Type 2 diabetes mellitus with other diabetic neurological complication: Secondary | ICD-10-CM | POA: Diagnosis not present

## 2019-01-23 DIAGNOSIS — E11610 Type 2 diabetes mellitus with diabetic neuropathic arthropathy: Secondary | ICD-10-CM | POA: Diagnosis not present

## 2019-01-26 DIAGNOSIS — F411 Generalized anxiety disorder: Secondary | ICD-10-CM | POA: Diagnosis not present

## 2019-01-26 DIAGNOSIS — F329 Major depressive disorder, single episode, unspecified: Secondary | ICD-10-CM | POA: Diagnosis not present

## 2019-02-02 DIAGNOSIS — F329 Major depressive disorder, single episode, unspecified: Secondary | ICD-10-CM | POA: Diagnosis not present

## 2019-02-02 DIAGNOSIS — F411 Generalized anxiety disorder: Secondary | ICD-10-CM | POA: Diagnosis not present

## 2019-02-10 DIAGNOSIS — R413 Other amnesia: Secondary | ICD-10-CM | POA: Diagnosis not present

## 2019-02-25 DIAGNOSIS — R413 Other amnesia: Secondary | ICD-10-CM | POA: Diagnosis not present

## 2019-03-06 DIAGNOSIS — Z794 Long term (current) use of insulin: Secondary | ICD-10-CM | POA: Diagnosis not present

## 2019-03-06 DIAGNOSIS — E1149 Type 2 diabetes mellitus with other diabetic neurological complication: Secondary | ICD-10-CM | POA: Diagnosis not present

## 2019-03-06 DIAGNOSIS — I10 Essential (primary) hypertension: Secondary | ICD-10-CM | POA: Diagnosis not present

## 2019-03-06 DIAGNOSIS — E785 Hyperlipidemia, unspecified: Secondary | ICD-10-CM | POA: Diagnosis not present

## 2019-03-16 DIAGNOSIS — M722 Plantar fascial fibromatosis: Secondary | ICD-10-CM | POA: Diagnosis not present

## 2019-03-16 DIAGNOSIS — S548X1D Unspecified injury of other nerves at forearm level, right arm, subsequent encounter: Secondary | ICD-10-CM | POA: Diagnosis not present

## 2019-03-16 DIAGNOSIS — G5631 Lesion of radial nerve, right upper limb: Secondary | ICD-10-CM | POA: Diagnosis not present

## 2019-03-23 ENCOUNTER — Other Ambulatory Visit: Payer: Self-pay

## 2019-03-24 DIAGNOSIS — E669 Obesity, unspecified: Secondary | ICD-10-CM | POA: Diagnosis not present

## 2019-03-24 DIAGNOSIS — M545 Low back pain: Secondary | ICD-10-CM | POA: Diagnosis not present

## 2019-03-24 DIAGNOSIS — Z7189 Other specified counseling: Secondary | ICD-10-CM | POA: Diagnosis not present

## 2019-03-24 DIAGNOSIS — M2550 Pain in unspecified joint: Secondary | ICD-10-CM | POA: Diagnosis not present

## 2019-03-24 DIAGNOSIS — Z79899 Other long term (current) drug therapy: Secondary | ICD-10-CM | POA: Diagnosis not present

## 2019-03-24 DIAGNOSIS — M25551 Pain in right hip: Secondary | ICD-10-CM | POA: Diagnosis not present

## 2019-03-24 DIAGNOSIS — L408 Other psoriasis: Secondary | ICD-10-CM | POA: Diagnosis not present

## 2019-03-24 DIAGNOSIS — L4050 Arthropathic psoriasis, unspecified: Secondary | ICD-10-CM | POA: Diagnosis not present

## 2019-03-24 DIAGNOSIS — Z6834 Body mass index (BMI) 34.0-34.9, adult: Secondary | ICD-10-CM | POA: Diagnosis not present

## 2019-03-25 ENCOUNTER — Ambulatory Visit (INDEPENDENT_AMBULATORY_CARE_PROVIDER_SITE_OTHER): Payer: Medicare PPO | Admitting: Endocrinology

## 2019-03-25 ENCOUNTER — Other Ambulatory Visit: Payer: Self-pay

## 2019-03-25 ENCOUNTER — Encounter: Payer: Self-pay | Admitting: Endocrinology

## 2019-03-25 VITALS — BP 142/80 | HR 78 | Temp 98.2°F | Wt 178.0 lb

## 2019-03-25 DIAGNOSIS — L4050 Arthropathic psoriasis, unspecified: Secondary | ICD-10-CM | POA: Diagnosis not present

## 2019-03-25 DIAGNOSIS — M722 Plantar fascial fibromatosis: Secondary | ICD-10-CM | POA: Diagnosis not present

## 2019-03-25 DIAGNOSIS — Z794 Long term (current) use of insulin: Secondary | ICD-10-CM | POA: Diagnosis not present

## 2019-03-25 DIAGNOSIS — E1142 Type 2 diabetes mellitus with diabetic polyneuropathy: Secondary | ICD-10-CM | POA: Diagnosis not present

## 2019-03-25 LAB — POCT GLYCOSYLATED HEMOGLOBIN (HGB A1C): Hemoglobin A1C: 6.9 % — AB (ref 4.0–5.6)

## 2019-03-25 MED ORDER — METFORMIN HCL 1000 MG PO TABS: 1000.0000 mg | ORAL_TABLET | Freq: Two times a day (BID) | ORAL | 3 refills | Status: DC

## 2019-03-25 MED ORDER — SEMAGLUTIDE (1 MG/DOSE) 2 MG/1.5ML ~~LOC~~ SOPN: 1.0000 mg | PEN_INJECTOR | SUBCUTANEOUS | 3 refills | Status: DC

## 2019-03-25 NOTE — Progress Notes (Signed)
Subjective:    Patient ID: Carrie [name], female    DOB: [birth year], 63 y.o.   MRN: [id number]  HPI Pt returns for f/u of diabetes mellitus: DM type: Insulin-requiring type 2 Dx'ed: 2010. Complications: painful polyneuropathy, renal insuff, and gastroparesis.   Therapy: insulin since 2011, and 2 oral meds.   GDM: 2000.  DKA: never. Severe hypoglycemia: never.  Pancreatitis: never.  Other: she did not tolerate invokana (vaginitis); she was on multiple daily injections in the past, but could not remember to take; psoriasis raises possibility she is evolving type 1 DM;  She wants to add more non-insulin rx, rather than increasing insulin; edema limits rx options Interval history: Meter is downloaded today, and the printout is scanned into the record.  It varies from 160-300.   pt states she feels well in general.  She says she never misses the medications.   Past Medical History:  Diagnosis Date  . Anxiety   . Arthritis    psoriatic arithritis, DDD   . Asthma   . Depression   . Diabetes mellitus   . Gastritis   . Gastroparesis   . GERD (gastroesophageal reflux disease)   . Hernia   . Hyperlipemia   . Hyperlipemia   . Hypertension   . Neuropathy    Bil feet re: to Diabetes  . Obesity   . Osteoporosis   . Plantar fasciitis   . Stricture and stenosis of esophagus   . TIA (transient ischemic attack)    8-10 yrs ago, no problems since  . Tuberculosis    tested positive 2011, no symptoms, was on medicine for 6 months    Past Surgical History:  Procedure Laterality Date  . BREAST SURGERY Bilateral    biopsies both breast  . CARDIAC CATHETERIZATION    . CESAREAN SECTION     x 2   . COLONOSCOPY    . JOINT REPLACEMENT     bilat knee   . KNEE SURGERY Bilateral    x 2 joint knee replacements  . UPPER GASTROINTESTINAL ENDOSCOPY    . WISDOM TOOTH EXTRACTION      Social History   Socioeconomic History  . Marital status: Single    Spouse name: Not on file  . Number of  children: 2  . Years of education: Not on file  . Highest education level: Not on file  Occupational History  . Occupation: Disabled   Social Needs  . Financial resource strain: Not on file  . Food insecurity    Worry: Not on file    Inability: Not on file  . Transportation needs    Medical: Not on file    Non-medical: Not on file  Tobacco Use  . Smoking status: Never Smoker  . Smokeless tobacco: Never Used  Substance and Sexual Activity  . Alcohol use: Not Currently    Comment: socially  . Drug use: No  . Sexual activity: Not Currently    Birth control/protection: Post-menopausal  Lifestyle  . Physical activity    Days per week: Not on file    Minutes per session: Not on file  . Stress: Not on file  Relationships  . Social Herbalist on phone: Not on file    Gets together: Not on file    Attends religious service: Not on file    Active member of club or organization: Not on file    Attends meetings of clubs or organizations: Not on file  Relationship status: Not on file  . Intimate partner violence    Fear of current or ex partner: Not on file    Emotionally abused: Not on file    Physically abused: Not on file    Forced sexual activity: Not on file  Other Topics Concern  . Not on file  Social History Narrative   Sodas daily     Current Outpatient Medications on File Prior to Visit  Medication Sig Dispense Refill  . albuterol (PROAIR HFA) 108 (90 BASE) MCG/ACT inhaler Inhale 2 puffs into the lungs every 4 (four) hours as needed for wheezing or shortness of breath.    Marland Kitchen amLODipine (NORVASC) 5 MG tablet Take 1 tablet (5 mg total) by mouth daily. 30 tablet 0  . aspirin EC 81 MG tablet Take 81 mg by mouth every morning.    . busPIRone (BUSPAR) 5 MG tablet Take 5 mg by mouth at bedtime.     . Calcium Carbonate-Vitamin D 600-400 MG-UNIT per tablet Take 1 tablet by mouth daily.    Marland Kitchen diltiazem (DILACOR XR) 180 MG 24 hr capsule Take 180 mg by mouth at bedtime.      Marland Kitchen esomeprazole (NEXIUM) 40 MG capsule Take 40 mg by mouth daily.     . folic acid (FOLVITE) 1 MG tablet Take 1 mg by mouth at bedtime.     . gabapentin (NEURONTIN) 100 MG capsule Take 100 mg by mouth daily.    Marland Kitchen gabapentin (NEURONTIN) 300 MG capsule Take 300 mg by mouth at bedtime.   5  . glucose blood (ACCU-CHEK AVIVA) test strip 1 each by Other route 2 (two) times daily. And lancets 2/day 100 each 12  . HUMIRA PEN 40 MG/0.4ML PNKT Inject 0.8 mLs into the skin every Wednesday.    . insulin glargine (LANTUS) 100 UNIT/ML injection Inject 0.4 mLs (40 Units total) into the skin every morning. And syringes 1/day 15 mL 11  . losartan (COZAAR) 100 MG tablet Take 100 mg by mouth daily.     Marland Kitchen lovastatin (MEVACOR) 20 MG tablet Take 2 tablets (40 mg total) by mouth daily at 6 PM. 30 tablet 0  . memantine (NAMENDA) 10 MG tablet Take 5 mg by mouth at bedtime.     . methocarbamol (ROBAXIN) 500 MG tablet Take 500 mg by mouth at bedtime.     . methotrexate 50 MG/2ML injection Inject 20 mg into the skin every Wednesday.     . nystatin cream (MYCOSTATIN) Apply 1 application topically daily as needed for dry skin.     Marland Kitchen PEG-KCl-NaCl-NaSulf-Na Asc-C (PLENVU) 140 g SOLR Take 140 g by mouth as directed. 1 each 0  . pravastatin (PRAVACHOL) 20 MG tablet Take 20 mg by mouth daily.    . primidone (MYSOLINE) 250 MG tablet Take 250 mg by mouth daily.     . sertraline (ZOLOFT) 50 MG tablet Take 50 mg by mouth at bedtime.      No current facility-administered medications on file prior to visit.     Allergies  Allergen Reactions  . Lisinopril Cough    Family History  Problem Relation Age of Onset  . Diabetes Other        maternal aunts and uncles  . Diabetes Father   . Liver disease Father   . Emphysema Father        smoked  . Diabetes Sister   . Diabetes Brother   . Sarcoidosis Brother   . Emphysema Mother  smoked  . Asthma Mother   . Asthma Brother   . Colon cancer Neg Hx   . Esophageal  cancer Neg Hx   . Stomach cancer Neg Hx   . Rectal cancer Neg Hx     BP (!) 142/80 (BP Location: Right Arm, Patient Position: Sitting, Cuff Size: Normal)   Pulse 78   Temp 98.2 F (36.8 C) (Oral)   Wt 178 lb (80.7 kg)   LMP  (LMP Unknown)   SpO2 94%   BMI 34.76 kg/m    Review of Systems She denies hypoglycemia.  She has constipation.      Objective:   Physical Exam VITAL SIGNS:  See vs page GENERAL: no distress Pulses: dorsalis pedis intact bilat.   MSK: no deformity of the feet CV: trace bilat leg edema Skin:  no ulcer on the feet.  normal color and temp on the feet.  Neuro: sensation is intact to touch on the feet Ext: There is bilateral onychomycosis of the toenails.     Lab Results  Component Value Date   CREATININE 0.85 01/15/2019   BUN 15 01/15/2019   NA 137 01/15/2019   K 4.0 01/15/2019   CL 104 01/15/2019   CO2 21 (L) 01/15/2019   Lab Results  Component Value Date   HGBA1C 6.9 (A) 03/25/2019       Assessment & Plan:  HTN: is noted today Insulin-requiring type 2 DM, with PN: overcontrolled, given this regimen, which does match insulin to her changing needs throughout the day. Edema: This limits rx options   Patient Instructions  Your blood pressure is high today.  Please see your primary care provider soon, to have it rechecked. I have sent 2 prescription to your pharmacy: for the non-extended-release metformin, and to change Januvia to Ozempic.  Please reduce the lantus to 30 units each morning.   check your blood sugar twice a day.  vary the time of day when you check, between before the 3 meals, and at bedtime.  also check if you have symptoms of your blood sugar being too high or too low.  please keep a record of the readings and bring it to your next appointment here (or you can bring the meter itself).  You can write it on any piece of paper.  please call us sooner if your blood sugar goes below 70, or if you have a lot of readings over 200.    Please come back for a follow-up appointment in 3 months.

## 2019-03-25 NOTE — Patient Instructions (Addendum)
Your blood pressure is high today.  Please see your primary care provider soon, to have it rechecked. I have sent 2 prescription to your pharmacy: for the non-extended-release metformin, and to change Januvia to Ozempic.  Please reduce the lantus to 30 units each morning.   check your blood sugar twice a day.  vary the time of day when you check, between before the 3 meals, and at bedtime.  also check if you have symptoms of your blood sugar being too high or too low.  please keep a record of the readings and bring it to your next appointment here (or you can bring the meter itself).  You can write it on any piece of paper.  please call us sooner if your blood sugar goes below 70, or if you have a lot of readings over 200.   Please come back for a follow-up appointment in 3 months.

## 2019-04-23 DIAGNOSIS — Z20828 Contact with and (suspected) exposure to other viral communicable diseases: Secondary | ICD-10-CM | POA: Diagnosis not present

## 2019-05-04 DIAGNOSIS — M159 Polyosteoarthritis, unspecified: Secondary | ICD-10-CM | POA: Diagnosis not present

## 2019-05-04 DIAGNOSIS — M069 Rheumatoid arthritis, unspecified: Secondary | ICD-10-CM | POA: Diagnosis not present

## 2019-05-04 DIAGNOSIS — E785 Hyperlipidemia, unspecified: Secondary | ICD-10-CM | POA: Diagnosis not present

## 2019-05-04 DIAGNOSIS — I10 Essential (primary) hypertension: Secondary | ICD-10-CM | POA: Diagnosis not present

## 2019-05-04 DIAGNOSIS — J45909 Unspecified asthma, uncomplicated: Secondary | ICD-10-CM | POA: Diagnosis not present

## 2019-05-04 DIAGNOSIS — F419 Anxiety disorder, unspecified: Secondary | ICD-10-CM | POA: Diagnosis not present

## 2019-05-04 DIAGNOSIS — G2581 Restless legs syndrome: Secondary | ICD-10-CM | POA: Diagnosis not present

## 2019-05-04 DIAGNOSIS — F325 Major depressive disorder, single episode, in full remission: Secondary | ICD-10-CM | POA: Diagnosis not present

## 2019-05-04 DIAGNOSIS — E1142 Type 2 diabetes mellitus with diabetic polyneuropathy: Secondary | ICD-10-CM | POA: Diagnosis not present

## 2019-05-06 DIAGNOSIS — N183 Chronic kidney disease, stage 3 (moderate): Secondary | ICD-10-CM | POA: Diagnosis not present

## 2019-05-06 DIAGNOSIS — E559 Vitamin D deficiency, unspecified: Secondary | ICD-10-CM | POA: Diagnosis not present

## 2019-05-06 DIAGNOSIS — M722 Plantar fascial fibromatosis: Secondary | ICD-10-CM | POA: Diagnosis not present

## 2019-05-06 DIAGNOSIS — I10 Essential (primary) hypertension: Secondary | ICD-10-CM | POA: Diagnosis not present

## 2019-05-07 DIAGNOSIS — I10 Essential (primary) hypertension: Secondary | ICD-10-CM | POA: Diagnosis not present

## 2019-05-07 DIAGNOSIS — N183 Chronic kidney disease, stage 3 (moderate): Secondary | ICD-10-CM | POA: Diagnosis not present

## 2019-05-07 DIAGNOSIS — N390 Urinary tract infection, site not specified: Secondary | ICD-10-CM | POA: Diagnosis not present

## 2019-05-19 DIAGNOSIS — Z01818 Encounter for other preprocedural examination: Secondary | ICD-10-CM | POA: Diagnosis not present

## 2019-05-19 DIAGNOSIS — Z8679 Personal history of other diseases of the circulatory system: Secondary | ICD-10-CM | POA: Diagnosis not present

## 2019-05-19 DIAGNOSIS — M722 Plantar fascial fibromatosis: Secondary | ICD-10-CM | POA: Diagnosis not present

## 2019-05-19 DIAGNOSIS — M7732 Calcaneal spur, left foot: Secondary | ICD-10-CM | POA: Diagnosis not present

## 2019-05-21 DIAGNOSIS — Z1159 Encounter for screening for other viral diseases: Secondary | ICD-10-CM | POA: Diagnosis not present

## 2019-05-21 DIAGNOSIS — Z01812 Encounter for preprocedural laboratory examination: Secondary | ICD-10-CM | POA: Diagnosis not present

## 2019-05-21 DIAGNOSIS — M722 Plantar fascial fibromatosis: Secondary | ICD-10-CM | POA: Diagnosis not present

## 2019-05-21 DIAGNOSIS — Z20828 Contact with and (suspected) exposure to other viral communicable diseases: Secondary | ICD-10-CM | POA: Diagnosis not present

## 2019-05-28 DIAGNOSIS — M722 Plantar fascial fibromatosis: Secondary | ICD-10-CM | POA: Diagnosis not present

## 2019-06-11 ENCOUNTER — Other Ambulatory Visit: Payer: Self-pay

## 2019-06-11 DIAGNOSIS — E1142 Type 2 diabetes mellitus with diabetic polyneuropathy: Secondary | ICD-10-CM

## 2019-06-11 MED ORDER — INSULIN GLARGINE 100 UNIT/ML ~~LOC~~ SOLN: 30.0000 [IU] | SUBCUTANEOUS | 0 refills | Status: DC

## 2019-06-19 ENCOUNTER — Other Ambulatory Visit: Payer: Self-pay

## 2019-06-24 ENCOUNTER — Other Ambulatory Visit: Payer: Self-pay

## 2019-06-24 ENCOUNTER — Ambulatory Visit (INDEPENDENT_AMBULATORY_CARE_PROVIDER_SITE_OTHER): Payer: Medicare PPO | Admitting: Endocrinology

## 2019-06-24 ENCOUNTER — Encounter: Payer: Self-pay | Admitting: Endocrinology

## 2019-06-24 VITALS — BP 140/80 | HR 96 | Ht 60.0 in | Wt 150.4 lb

## 2019-06-24 DIAGNOSIS — E1142 Type 2 diabetes mellitus with diabetic polyneuropathy: Secondary | ICD-10-CM

## 2019-06-24 DIAGNOSIS — Z794 Long term (current) use of insulin: Secondary | ICD-10-CM

## 2019-06-24 DIAGNOSIS — E119 Type 2 diabetes mellitus without complications: Secondary | ICD-10-CM | POA: Diagnosis not present

## 2019-06-24 LAB — POCT GLYCOSYLATED HEMOGLOBIN (HGB A1C): Hemoglobin A1C: 5.8 % — AB (ref 4.0–5.6)

## 2019-06-24 MED ORDER — INSULIN GLARGINE 100 UNIT/ML ~~LOC~~ SOLN: 20.0000 [IU] | SUBCUTANEOUS | 0 refills | Status: DC

## 2019-06-24 NOTE — Patient Instructions (Addendum)
Your blood pressure is high today.  Please see your primary care provider soon, to have it rechecked. Please reduce the lantus to 20 units each morning.   Please continue the same other diabetes medications.   check your blood sugar twice a day.  vary the time of day when you check, between before the 3 meals, and at bedtime.  also check if you have symptoms of your blood sugar being too high or too low.  please keep a record of the readings and bring it to your next appointment here (or you can bring the meter itself).  You can write it on any piece of paper.  please call us sooner if your blood sugar goes below 70, or if you have a lot of readings over 200.   Please come back for a follow-up appointment in 2-3 months.

## 2019-06-24 NOTE — Progress Notes (Signed)
Subjective:    Patient ID: Carrie [name], female    DOB: [birth year], 63 y.o.   MRN: [id number]  HPI Pt returns for f/u of diabetes mellitus: DM type: Insulin-requiring type 2 Dx'ed: 2010. Complications: painful polyneuropathy, renal insuff, and gastroparesis.   Therapy: insulin since 2011, and 2 oral meds.   GDM: 2000.  DKA: never. Severe hypoglycemia: never.  Pancreatitis: never.  Other: she did not tolerate invokana (vaginitis); she was on multiple daily injections in the past, but could not remember to take; psoriasis raises possibility she is evolving type 1 DM;  She wants to add more non-insulin rx, rather than increasing insulin; edema limits rx options Interval history: no cbg record, but states cbg's are vary from 95-200's.  She says she never misses the medications.  Past Medical History:  Diagnosis Date  . Anxiety   . Arthritis    psoriatic arithritis, DDD   . Asthma   . Depression   . Diabetes mellitus   . Gastritis   . Gastroparesis   . GERD (gastroesophageal reflux disease)   . Hernia   . Hyperlipemia   . Hyperlipemia   . Hypertension   . Neuropathy    Bil feet re: to Diabetes  . Obesity   . Osteoporosis   . Plantar fasciitis   . Stricture and stenosis of esophagus   . TIA (transient ischemic attack)    8-10 yrs ago, no problems since  . Tuberculosis    tested positive 2011, no symptoms, was on medicine for 6 months    Past Surgical History:  Procedure Laterality Date  . BREAST SURGERY Bilateral    biopsies both breast  . CARDIAC CATHETERIZATION    . CESAREAN SECTION     x 2   . COLONOSCOPY    . JOINT REPLACEMENT     bilat knee   . KNEE SURGERY Bilateral    x 2 joint knee replacements  . UPPER GASTROINTESTINAL ENDOSCOPY    . WISDOM TOOTH EXTRACTION      Social History   Socioeconomic History  . Marital status: Single    Spouse name: Not on file  . Number of children: 2  . Years of education: Not on file  . Highest education level: Not  on file  Occupational History  . Occupation: Disabled   Social Needs  . Financial resource strain: Not on file  . Food insecurity    Worry: Not on file    Inability: Not on file  . Transportation needs    Medical: Not on file    Non-medical: Not on file  Tobacco Use  . Smoking status: Never Smoker  . Smokeless tobacco: Never Used  Substance and Sexual Activity  . Alcohol use: Not Currently    Comment: socially  . Drug use: No  . Sexual activity: Not Currently    Birth control/protection: Post-menopausal  Lifestyle  . Physical activity    Days per week: Not on file    Minutes per session: Not on file  . Stress: Not on file  Relationships  . Social Herbalist on phone: Not on file    Gets together: Not on file    Attends religious service: Not on file    Active member of club or organization: Not on file    Attends meetings of clubs or organizations: Not on file    Relationship status: Not on file  . Intimate partner violence    Fear of current or  ex partner: Not on file    Emotionally abused: Not on file    Physically abused: Not on file    Forced sexual activity: Not on file  Other Topics Concern  . Not on file  Social History Narrative   [place] daily     Current Outpatient Medications on File Prior to Visit  Medication Sig Dispense Refill  . albuterol (PROAIR HFA) 108 (90 BASE) MCG/ACT inhaler Inhale 2 puffs into the lungs every 4 (four) hours as needed for wheezing or shortness of breath.    Marland Kitchen amLODipine (NORVASC) 5 MG tablet Take 1 tablet (5 mg total) by mouth daily. 30 tablet 0  . aspirin EC 81 MG tablet Take 81 mg by mouth every morning.    . busPIRone (BUSPAR) 5 MG tablet Take 5 mg by mouth at bedtime.     . Calcium Carbonate-Vitamin D 600-400 MG-UNIT per tablet Take 1 tablet by mouth daily.    Marland Kitchen diltiazem (DILACOR XR) 180 MG 24 hr capsule Take 180 mg by mouth at bedtime.     Marland Kitchen esomeprazole (NEXIUM) 40 MG capsule Take 40 mg by mouth daily.     .  folic acid (FOLVITE) 1 MG tablet Take 1 mg by mouth at bedtime.     . gabapentin (NEURONTIN) 100 MG capsule Take 100 mg by mouth daily.    Marland Kitchen gabapentin (NEURONTIN) 300 MG capsule Take 300 mg by mouth at bedtime.   5  . glucose blood (ACCU-CHEK AVIVA) test strip 1 each by Other route 2 (two) times daily. And lancets 2/day 100 each 12  . HUMIRA PEN 40 MG/0.4ML PNKT Inject 0.8 mLs into the skin every Wednesday.    . losartan (COZAAR) 100 MG tablet Take 100 mg by mouth daily.     Marland Kitchen lovastatin (MEVACOR) 20 MG tablet Take 2 tablets (40 mg total) by mouth daily at 6 PM. 30 tablet 0  . memantine (NAMENDA) 10 MG tablet Take 5 mg by mouth at bedtime.     . metFORMIN (GLUCOPHAGE) 1000 MG tablet Take 1 tablet (1,000 mg total) by mouth 2 (two) times daily with a meal. 180 tablet 3  . methocarbamol (ROBAXIN) 500 MG tablet Take 500 mg by mouth at bedtime.     . methotrexate 50 MG/2ML injection Inject 20 mg into the skin every Wednesday.     . nystatin cream (MYCOSTATIN) Apply 1 application topically daily as needed for dry skin.     Marland Kitchen PEG-KCl-NaCl-NaSulf-Na Asc-C (PLENVU) 140 g SOLR Take 140 g by mouth as directed. 1 each 0  . pravastatin (PRAVACHOL) 20 MG tablet Take 20 mg by mouth daily.    . primidone (MYSOLINE) 250 MG tablet Take 250 mg by mouth daily.     . Semaglutide, 1 MG/DOSE, (OZEMPIC, 1 MG/DOSE,) 2 MG/1.5ML SOPN Inject 1 mg into the skin once a week. 6 pen 3  . sertraline (ZOLOFT) 50 MG tablet Take 50 mg by mouth at bedtime.      No current facility-administered medications on file prior to visit.     Allergies  Allergen Reactions  . Lisinopril Cough    Family History  Problem Relation Age of Onset  . Diabetes Other        maternal aunts and uncles  . Diabetes Father   . Liver disease Father   . Emphysema Father        smoked  . Diabetes Sister   . Diabetes Brother   . Sarcoidosis Brother   .  Emphysema Mother        smoked  . Asthma Mother   . Asthma Brother   . Colon cancer Neg  Hx   . Esophageal cancer Neg Hx   . Stomach cancer Neg Hx   . Rectal cancer Neg Hx     BP 140/80 (BP Location: Left Arm, Patient Position: Sitting, Cuff Size: Normal)   Pulse 96   Ht 5' (1.524 m)   Wt 150 lb 6.4 oz (68.2 kg)   LMP  (LMP Unknown)   SpO2 98%   BMI 29.37 kg/m    Review of Systems She has lost a few lbs, due to her efforts.  She denies hypoglycemia.      Objective:   Physical Exam VITAL SIGNS:  See vs page GENERAL: no distress Pulses: dorsalis pedis intact bilat.   MSK: no deformity of the feet CV: no leg edema.   Skin:  no ulcer on the feet.  normal color and temp on the feet.  bandaid is on the left foot (recent procedure).   Neuro: sensation is intact to touch on the feet.   Ext: there is bilateral onychomycosis of the toenails.    Lab Results  Component Value Date   HGBA1C 5.8 (A) 06/24/2019   Lab Results  Component Value Date   CREATININE 0.85 01/15/2019   BUN 15 01/15/2019   NA 137 01/15/2019   K 4.0 01/15/2019   CL 104 01/15/2019   CO2 21 (L) 01/15/2019       Assessment & Plan:  HTN: is noted today. Insulin-requiring type 2 DM, with autonomic neuropathy: overcontrolled, given this regimen, which does match insulin to her changing needs throughout the day.  Patient Instructions  Your blood pressure is high today.  Please see your primary care provider soon, to have it rechecked. Please reduce the lantus to 20 units each morning.   Please continue the same other diabetes medications.   check your blood sugar twice a day.  vary the time of day when you check, between before the 3 meals, and at bedtime.  also check if you have symptoms of your blood sugar being too high or too low.  please keep a record of the readings and bring it to your next appointment here (or you can bring the meter itself).  You can write it on any piece of paper.  please call us sooner if your blood sugar goes below 70, or if you have a lot of readings over 200.   Please  come back for a follow-up appointment in 2-3 months.

## 2019-07-08 ENCOUNTER — Other Ambulatory Visit: Payer: Self-pay | Admitting: Endocrinology

## 2019-07-08 DIAGNOSIS — E1142 Type 2 diabetes mellitus with diabetic polyneuropathy: Secondary | ICD-10-CM

## 2019-07-08 DIAGNOSIS — Z794 Long term (current) use of insulin: Secondary | ICD-10-CM

## 2019-07-29 ENCOUNTER — Other Ambulatory Visit: Payer: Self-pay | Admitting: Endocrinology

## 2019-07-29 DIAGNOSIS — E1142 Type 2 diabetes mellitus with diabetic polyneuropathy: Secondary | ICD-10-CM

## 2019-07-29 DIAGNOSIS — Z794 Long term (current) use of insulin: Secondary | ICD-10-CM

## 2019-08-14 ENCOUNTER — Other Ambulatory Visit: Payer: Self-pay | Admitting: Endocrinology

## 2019-09-15 ENCOUNTER — Other Ambulatory Visit: Payer: Self-pay

## 2019-09-17 ENCOUNTER — Encounter: Payer: Self-pay | Admitting: Endocrinology

## 2019-09-17 ENCOUNTER — Ambulatory Visit (INDEPENDENT_AMBULATORY_CARE_PROVIDER_SITE_OTHER): Payer: Medicare PPO | Admitting: Endocrinology

## 2019-09-17 VITALS — BP 104/60 | HR 79 | Ht 60.0 in | Wt 158.4 lb

## 2019-09-17 DIAGNOSIS — E1142 Type 2 diabetes mellitus with diabetic polyneuropathy: Secondary | ICD-10-CM

## 2019-09-17 DIAGNOSIS — Z794 Long term (current) use of insulin: Secondary | ICD-10-CM

## 2019-09-17 DIAGNOSIS — E119 Type 2 diabetes mellitus without complications: Secondary | ICD-10-CM | POA: Diagnosis not present

## 2019-09-17 LAB — POCT GLYCOSYLATED HEMOGLOBIN (HGB A1C): Hemoglobin A1C: 5.7 % — AB (ref 4.0–5.6)

## 2019-09-17 MED ORDER — INSULIN GLARGINE 100 UNIT/ML ~~LOC~~ SOLN: 10.0000 [IU] | SUBCUTANEOUS | 2 refills | Status: DC

## 2019-09-17 NOTE — Progress Notes (Signed)
error 

## 2019-09-17 NOTE — Patient Instructions (Addendum)
Please reduce the lantus to 10 units each morning.   Please continue the same other diabetes medications.   check your blood sugar twice a day.  vary the time of day when you check, between before the 3 meals, and at bedtime.  also check if you have symptoms of your blood sugar being too high or too low.  please keep a record of the readings and bring it to your next appointment here (or you can bring the meter itself).  You can write it on any piece of paper.  please call us sooner if your blood sugar goes below 70, or if you have a lot of readings over 200.   Please come back for a follow-up appointment in 2 months.

## 2019-09-17 NOTE — Progress Notes (Signed)
Subjective:    Patient ID: Carrie [name], female    DOB: [birth year], 63 y.o.   MRN: [id number]  HPI Pt returns for f/u of diabetes mellitus: DM type: Insulin-requiring type 2 Dx'ed: 2010. Complications: painful polyneuropathy, renal insuff, and gastroparesis.   Therapy: insulin since 2011, metformin, and Ozempic.   GDM: 2000.  DKA: never. Severe hypoglycemia: never.  Pancreatitis: never.  Other: she did not tolerate invokana (vaginitis); she was on multiple daily injections in the past, but could not remember to take; psoriasis raises possibility she is evolving type 1 DM;  She wants to add more non-insulin rx, rather than increasing insulin; edema limits rx options.   Interval history: no cbg record, but states cbg's are vary from 55-170.  She says she never misses the medications. pt states she feels well in general.   Past Medical History:  Diagnosis Date  . Anxiety   . Arthritis    psoriatic arithritis, DDD   . Asthma   . Depression   . Diabetes mellitus   . Gastritis   . Gastroparesis   . GERD (gastroesophageal reflux disease)   . Hernia   . Hyperlipemia   . Hyperlipemia   . Hypertension   . Neuropathy    Bil feet re: to Diabetes  . Obesity   . Osteoporosis   . Plantar fasciitis   . Stricture and stenosis of esophagus   . TIA (transient ischemic attack)    8-10 yrs ago, no problems since  . Tuberculosis    tested positive 2011, no symptoms, was on medicine for 6 months    Past Surgical History:  Procedure Laterality Date  . BREAST SURGERY Bilateral    biopsies both breast  . CARDIAC CATHETERIZATION    . CESAREAN SECTION     x 2   . COLONOSCOPY    . JOINT REPLACEMENT     bilat knee   . KNEE SURGERY Bilateral    x 2 joint knee replacements  . UPPER GASTROINTESTINAL ENDOSCOPY    . WISDOM TOOTH EXTRACTION      Social History   Socioeconomic History  . Marital status: Single    Spouse name: Not on file  . Number of children: 2  . Years of education:  Not on file  . Highest education level: Not on file  Occupational History  . Occupation: Disabled   Social Needs  . Financial resource strain: Not on file  . Food insecurity    Worry: Not on file    Inability: Not on file  . Transportation needs    Medical: Not on file    Non-medical: Not on file  Tobacco Use  . Smoking status: Never Smoker  . Smokeless tobacco: Never Used  Substance and Sexual Activity  . Alcohol use: Not Currently    Comment: socially  . Drug use: No  . Sexual activity: Not Currently    Birth control/protection: Post-menopausal  Lifestyle  . Physical activity    Days per week: Not on file    Minutes per session: Not on file  . Stress: Not on file  Relationships  . Social Herbalist on phone: Not on file    Gets together: Not on file    Attends religious service: Not on file    Active member of club or organization: Not on file    Attends meetings of clubs or organizations: Not on file    Relationship status: Not on file  . Intimate  partner violence    Fear of current or ex partner: Not on file    Emotionally abused: Not on file    Physically abused: Not on file    Forced sexual activity: Not on file  Other Topics Concern  . Not on file  Social History Narrative   [place] daily     Current Outpatient Medications on File Prior to Visit  Medication Sig Dispense Refill  . albuterol (PROAIR HFA) 108 (90 BASE) MCG/ACT inhaler Inhale 2 puffs into the lungs every 4 (four) hours as needed for wheezing or shortness of breath.    Marland Kitchen amLODipine (NORVASC) 5 MG tablet Take 1 tablet (5 mg total) by mouth daily. 30 tablet 0  . aspirin EC 81 MG tablet Take 81 mg by mouth every morning.    . busPIRone (BUSPAR) 5 MG tablet Take 5 mg by mouth at bedtime.     . Calcium Carbonate-Vitamin D 600-400 MG-UNIT per tablet Take 1 tablet by mouth daily.    Marland Kitchen diltiazem (DILACOR XR) 180 MG 24 hr capsule Take 180 mg by mouth at bedtime.     Marland Kitchen esomeprazole (NEXIUM) 40 MG  capsule Take 40 mg by mouth daily.     . folic acid (FOLVITE) 1 MG tablet Take 1 mg by mouth at bedtime.     . gabapentin (NEURONTIN) 100 MG capsule Take 100 mg by mouth daily.    Marland Kitchen gabapentin (NEURONTIN) 300 MG capsule Take 300 mg by mouth at bedtime.   5  . glucose blood (ACCU-CHEK AVIVA) test strip 1 each by Other route 2 (two) times daily. And lancets 2/day 100 each 12  . HUMIRA PEN 40 MG/0.4ML PNKT Inject 0.8 mLs into the skin every Wednesday.    . losartan (COZAAR) 100 MG tablet Take 100 mg by mouth daily.     Marland Kitchen lovastatin (MEVACOR) 20 MG tablet Take 2 tablets (40 mg total) by mouth daily at 6 PM. 30 tablet 0  . memantine (NAMENDA) 10 MG tablet Take 5 mg by mouth at bedtime.     . metFORMIN (GLUCOPHAGE) 1000 MG tablet Take 1 tablet (1,000 mg total) by mouth 2 (two) times daily with a meal. 180 tablet 3  . methocarbamol (ROBAXIN) 500 MG tablet Take 500 mg by mouth at bedtime.     . methotrexate 50 MG/2ML injection Inject 20 mg into the skin every Wednesday.     . nystatin cream (MYCOSTATIN) Apply 1 application topically daily as needed for dry skin.     Marland Kitchen OZEMPIC, 1 MG/DOSE, 2 MG/1.5ML SOPN INJECT 1 MG INTO THE SKIN ONCE A WEEK. 4 pen 1  . PEG-KCl-NaCl-NaSulf-Na Asc-C (PLENVU) 140 g SOLR Take 140 g by mouth as directed. 1 each 0  . pravastatin (PRAVACHOL) 20 MG tablet Take 20 mg by mouth daily.    . primidone (MYSOLINE) 250 MG tablet Take 250 mg by mouth daily.     . sertraline (ZOLOFT) 50 MG tablet Take 50 mg by mouth at bedtime.      No current facility-administered medications on file prior to visit.     Allergies  Allergen Reactions  . Lisinopril Cough    Family History  Problem Relation Age of Onset  . Diabetes Other        maternal aunts and uncles  . Diabetes Father   . Liver disease Father   . Emphysema Father        smoked  . Diabetes Sister   . Diabetes Brother   .  Sarcoidosis Brother   . Emphysema Mother        smoked  . Asthma Mother   . Asthma Brother   .  Colon cancer Neg Hx   . Esophageal cancer Neg Hx   . Stomach cancer Neg Hx   . Rectal cancer Neg Hx     BP 104/60 (BP Location: Left Arm, Patient Position: Sitting, Cuff Size: Normal)   Pulse 79   Ht 5' (1.524 m)   Wt 158 lb 6.4 oz (71.8 kg)   LMP  (LMP Unknown)   SpO2 90%   BMI 30.94 kg/m    Review of Systems Denies LOC    Objective:   Physical Exam VITAL SIGNS:  See vs page GENERAL: no distress Pulses: dorsalis pedis intact bilat.   MSK: no deformity of the feet CV: no leg edema Skin:  no ulcer on the feet.  normal color and temp on the feet. Neuro: sensation is intact to touch on the feet  A1c=5.7%  Lab Results  Component Value Date   CREATININE 0.85 01/15/2019   BUN 15 01/15/2019   NA 137 01/15/2019   K 4.0 01/15/2019   CL 104 01/15/2019   CO2 21 (L) 01/15/2019      Assessment & Plan:  Insulin-requiring type 2 DM, with PN: overcontrolled.  We discussed d/c insulin vs reducing.  She chooses the latter Hypoglycemia: this limits aggressiveness of glycemic control  Patient Instructions  Please reduce the lantus to 10 units each morning.   Please continue the same other diabetes medications.   check your blood sugar twice a day.  vary the time of day when you check, between before the 3 meals, and at bedtime.  also check if you have symptoms of your blood sugar being too high or too low.  please keep a record of the readings and bring it to your next appointment here (or you can bring the meter itself).  You can write it on any piece of paper.  please call us sooner if your blood sugar goes below 70, or if you have a lot of readings over 200.   Please come back for a follow-up appointment in 2 months.

## 2019-09-23 ENCOUNTER — Other Ambulatory Visit: Payer: Self-pay

## 2019-09-23 DIAGNOSIS — Z20822 Contact with and (suspected) exposure to covid-19: Secondary | ICD-10-CM

## 2019-09-27 LAB — NOVEL CORONAVIRUS, NAA

## 2019-10-11 IMAGING — MR MR HEAD W/O CM
10 of 11 series · 37 of 48 positions shown · non-contrast
Comparison: 05/27/2018 CT head.  06/25/2015 MRI head.

CLINICAL DATA: 62 y/o F; Botox injection to the right arm for
tremors on [REDACTED]. Patient has developed right hand numbness, right
index and ring finger immobility, and pain into the right shoulder
as well as back of head.

EXAM:
MRI HEAD WITHOUT CONTRAST
TECHNIQUE: Multiplanar, multiecho pulse sequences of the brain and surrounding
structures were obtained without intravenous contrast.

[Series 4: DWI · coronal · 4.0mm · 0.94mm/px · 7 of 72 slices shown (1 of 2)]
[im 1/72]
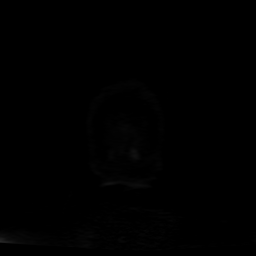
[im 12/72]
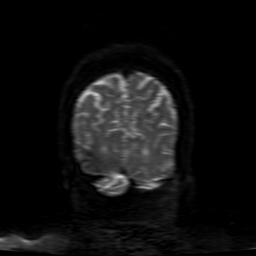
[im 24/72]
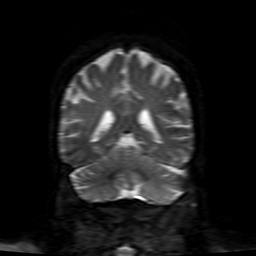
[im 36/72]
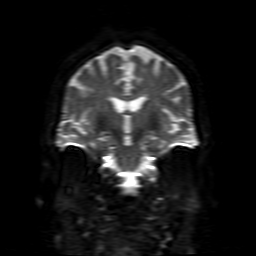
[im 48/72]
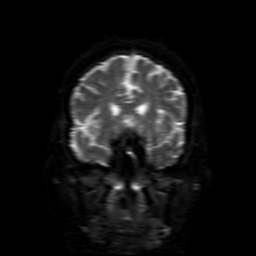
[im 60/72]
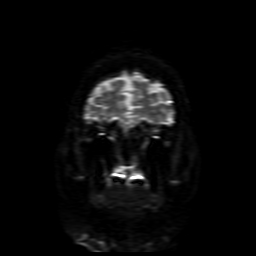
[im 72/72]
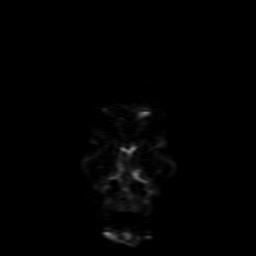

[Series 5: DWI · axial · 3.0mm · 0.94mm/px · z∈[-60,+86]mm · 8 of 100 slices shown (2 of 2)]
[im 1/100]
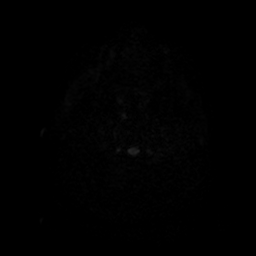
[im 13/100]
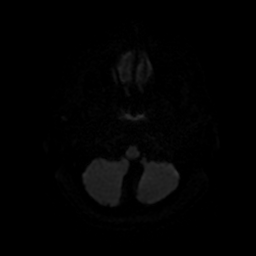
[im 25/100]
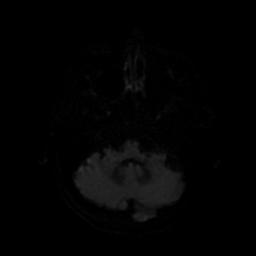
[im 38/100]
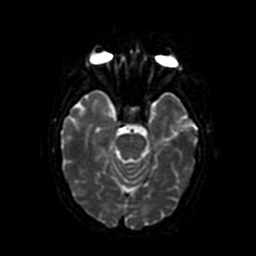
[im 62/100]
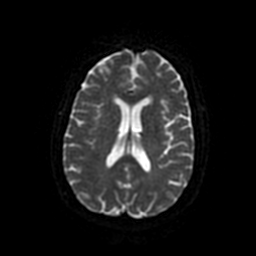
[im 75/100]
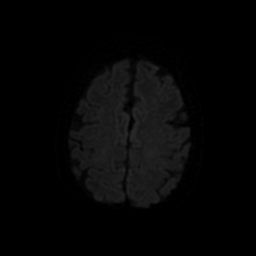
[im 87/100]
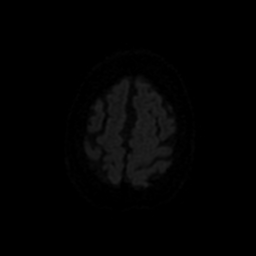
[im 100/100]
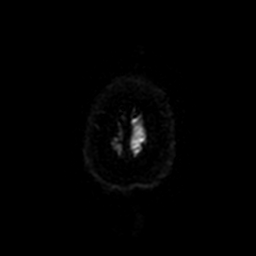

[Series 6: FLAIR · sagittal · 5.0mm · 0.47mm/px · 2 of 23 slices shown (1 of 3)]
[im 1/23]
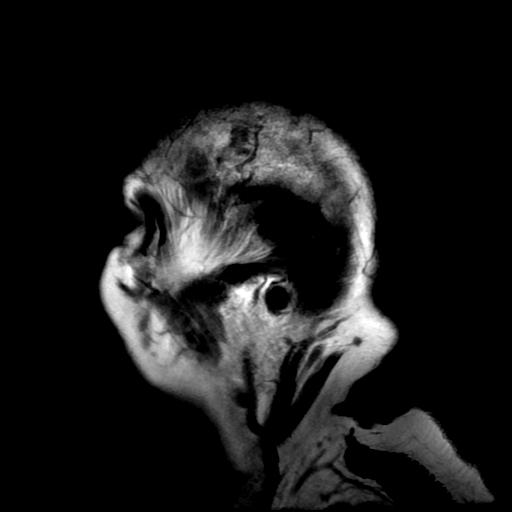
[im 23/23]
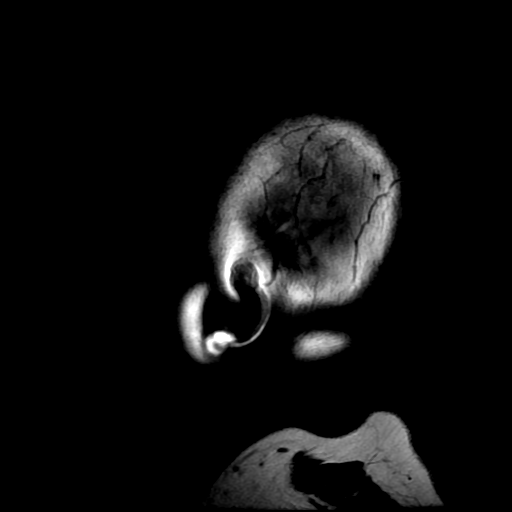

[Series 7: T2 · axial · 5.0mm · 0.47mm/px · z∈[-58,+85]mm · 2 of 25 slices shown (1 of 2)]
[im 1/25]
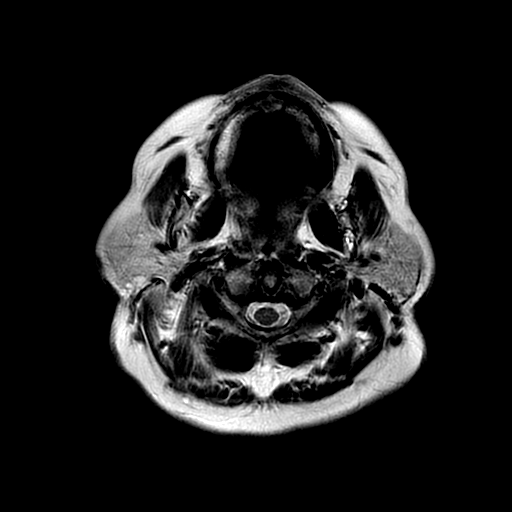
[im 25/25]
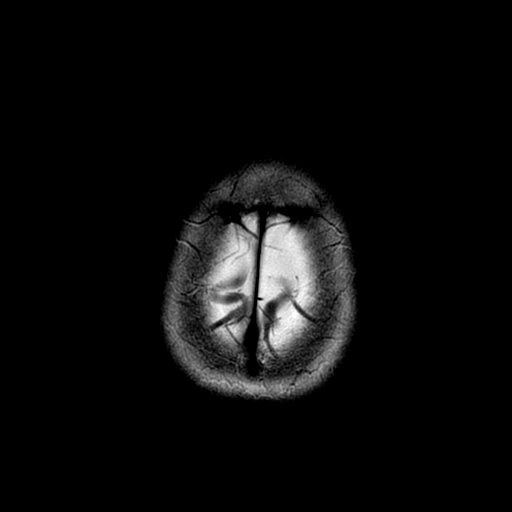

[Series 8: FLAIR · sagittal · 5.0mm · 0.47mm/px · 2 of 23 slices shown (2 of 3)]
[im 1/23]
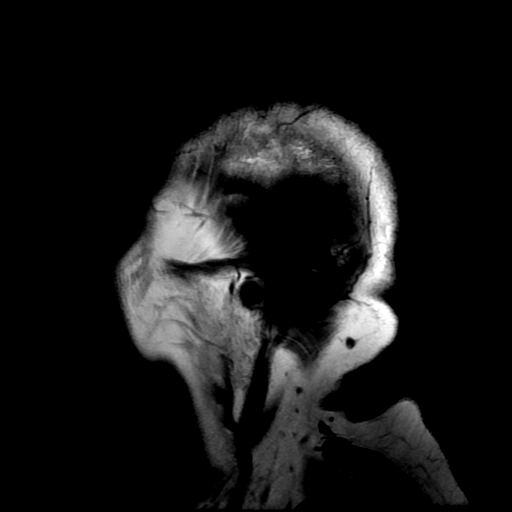
[im 23/23]
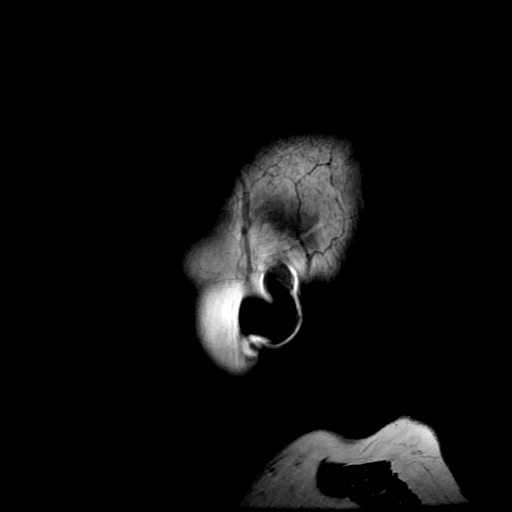

[Series 9: FLAIR · axial · 5.0mm · 0.47mm/px · z∈[-58,+85]mm · 2 of 25 slices shown (3 of 3)]
[im 1/25]
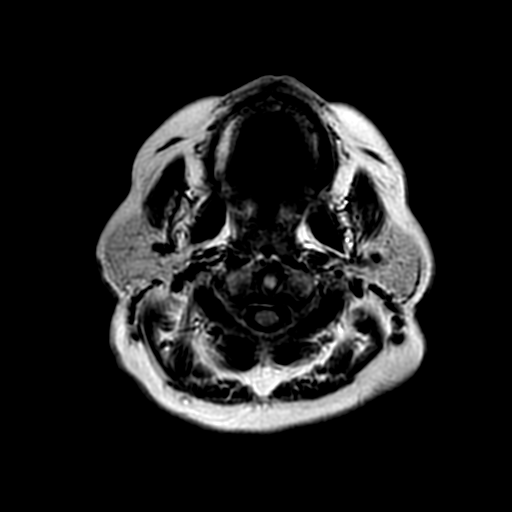
[im 25/25]
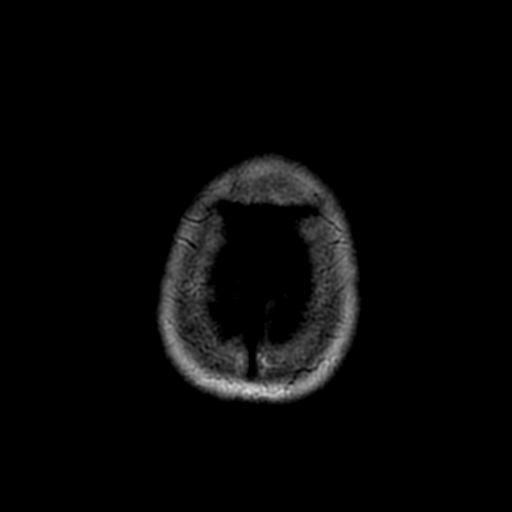

[Series 10: (person_name) · axial · 3.0mm · 0.47mm/px · z∈[-59,-24]mm · 3 of 100 slices shown]
[im 1/100]
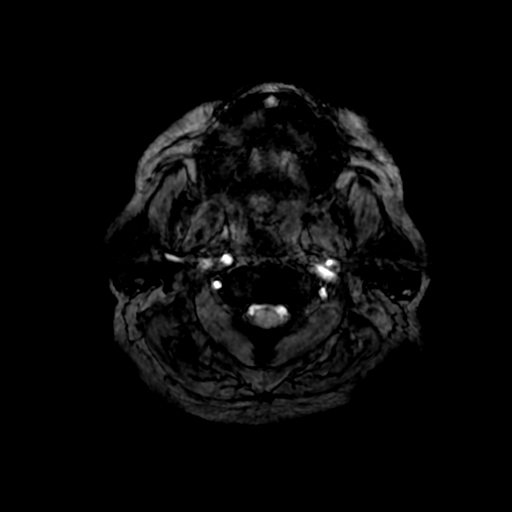
[im 13/100]
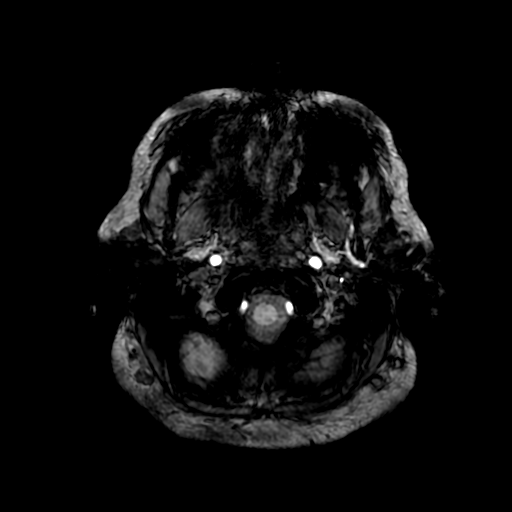
[im 25/100]
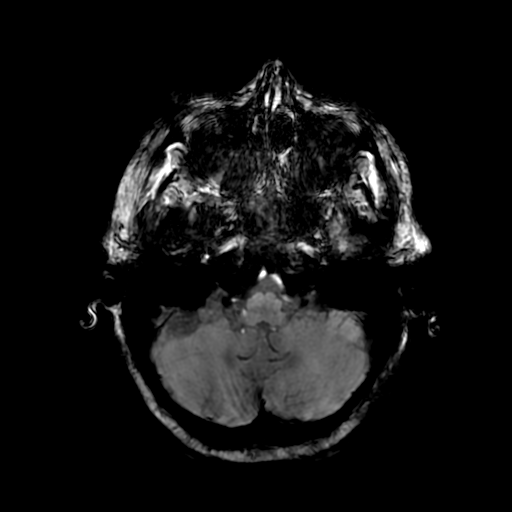

[Series 12: T2 · coronal · 5.0mm · 0.94mm/px · 3 of 30 slices shown (2 of 2)]
[im 1/30]
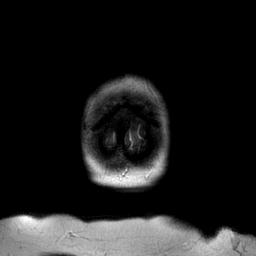
[im 15/30]
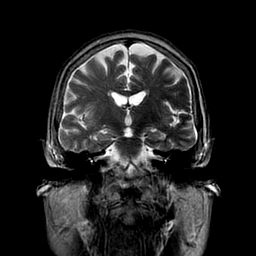
[im 30/30]
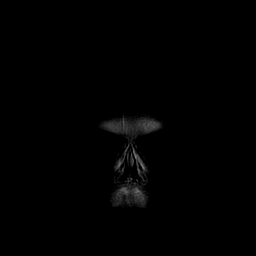

[Series 452: ADC · coronal · 4.0mm · 0.94mm/px · 3 of 36 slices shown (1 of 2)]
[im 1/36]
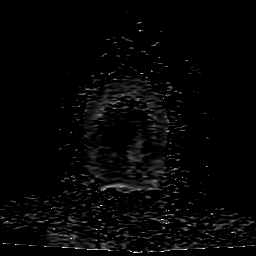
[im 18/36]
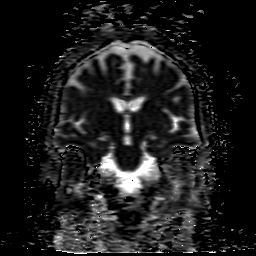
[im 36/36]
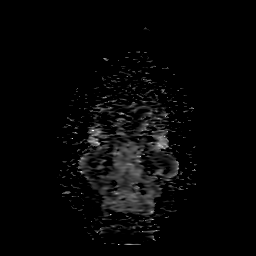

[Series 552: ADC · axial · 3.0mm · 0.94mm/px · z∈[-60,+86]mm · 5 of 50 slices shown (2 of 2)]
[im 1/50]
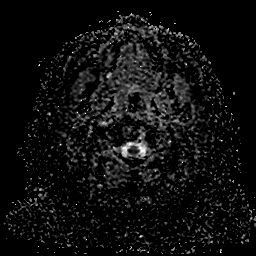
[im 13/50]
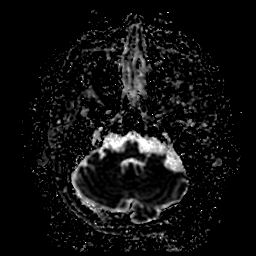
[im 25/50]
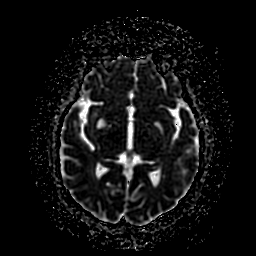
[im 37/50]
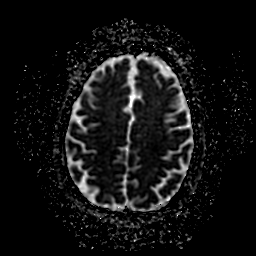
[im 50/50]
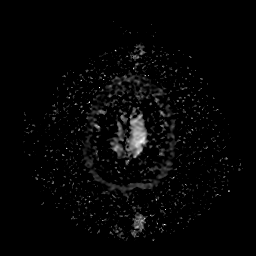

[37 of 48 positions shown; findings below may reference images not displayed]

FINDINGS: Brain: No acute infarction, hemorrhage, hydrocephalus, extra-axial
collection or mass lesion. Few stable nonspecific T2 FLAIR
hyperintensities in subcortical and periventricular white matter are
compatible with mild chronic microvascular ischemic changes for age.
Stable mild volume loss of the brain.

Vascular: Normal flow voids.

Skull and upper cervical spine: Normal marrow signal.

Sinuses/Orbits: Mild mucosal thickening of the paranasal sinuses. No
abnormal signal of the mastoid air cells. Orbits are unremarkable.

Other: None.
IMPRESSION: 1. No acute intracranial abnormality identified.
2. Stable mild chronic microvascular ischemic changes and volume
loss of the brain.

## 2019-10-19 ENCOUNTER — Other Ambulatory Visit: Payer: Self-pay | Admitting: Endocrinology

## 2019-10-19 DIAGNOSIS — E1142 Type 2 diabetes mellitus with diabetic polyneuropathy: Secondary | ICD-10-CM

## 2019-10-19 DIAGNOSIS — Z794 Long term (current) use of insulin: Secondary | ICD-10-CM

## 2019-11-17 ENCOUNTER — Other Ambulatory Visit: Payer: Self-pay

## 2019-11-19 ENCOUNTER — Encounter: Payer: Self-pay | Admitting: Endocrinology

## 2019-11-19 ENCOUNTER — Ambulatory Visit (INDEPENDENT_AMBULATORY_CARE_PROVIDER_SITE_OTHER): Payer: Medicare PPO | Admitting: Endocrinology

## 2019-11-19 ENCOUNTER — Other Ambulatory Visit: Payer: Self-pay

## 2019-11-19 VITALS — BP 120/60 | HR 85 | Ht 60.0 in | Wt 151.6 lb

## 2019-11-19 DIAGNOSIS — Z794 Long term (current) use of insulin: Secondary | ICD-10-CM | POA: Diagnosis not present

## 2019-11-19 DIAGNOSIS — E1142 Type 2 diabetes mellitus with diabetic polyneuropathy: Secondary | ICD-10-CM | POA: Diagnosis not present

## 2019-11-19 LAB — POCT GLYCOSYLATED HEMOGLOBIN (HGB A1C): Hemoglobin A1C: 5.5 % (ref 4.0–5.6)

## 2019-11-19 NOTE — Patient Instructions (Addendum)
Please stop taking the lantus.   Please continue the same other diabetes medications.   check your blood sugar twice a day.  vary the time of day when you check, between before the 3 meals, and at bedtime.  also check if you have symptoms of your blood sugar being too high or too low.  please keep a record of the readings and bring it to your next appointment here (or you can bring the meter itself).  You can write it on any piece of paper.  please call us sooner if your blood sugar goes below 70, or if you have a lot of readings over 200.   Please come back for a follow-up appointment in 2 months.

## 2019-11-19 NOTE — Progress Notes (Signed)
Subjective:    Patient ID: Carrie [name], female    DOB: [birth year], 64 y.o.   MRN: [id number]  HPI Pt returns for f/u of diabetes mellitus: DM type: Insulin-requiring type 2 Dx'ed: 2010. Complications: painful polyneuropathy, renal insuff, and gastroparesis.   Therapy: metformin and Ozempic.   GDM: 2000.  DKA: never. Severe hypoglycemia: never.  Pancreatitis: never.  SDOH: she was on multiple daily injections in the past, but could not remember to take; Other: she did not tolerate invokana (vaginitis); psoriasis raises possibility she is evolving type 1 DM;  She wants to add more non-insulin rx, rather than increasing insulin; edema limits rx options.  She took insulin 2011-2121.  Interval history: no cbg record, but states cbg's are vary from 80-150.  She says she never misses the medications. pt states she feels well in general.   Past Medical History:  Diagnosis Date  . Anxiety   . Arthritis    psoriatic arithritis, DDD   . Asthma   . Depression   . Diabetes mellitus   . Gastritis   . Gastroparesis   . GERD (gastroesophageal reflux disease)   . Hernia   . Hyperlipemia   . Hyperlipemia   . Hypertension   . Neuropathy    Bil feet re: to Diabetes  . Obesity   . Osteoporosis   . Plantar fasciitis   . Stricture and stenosis of esophagus   . TIA (transient ischemic attack)    8-10 yrs ago, no problems since  . Tuberculosis    tested positive 2011, no symptoms, was on medicine for 6 months    Past Surgical History:  Procedure Laterality Date  . BREAST SURGERY Bilateral    biopsies both breast  . CARDIAC CATHETERIZATION    . CESAREAN SECTION     x 2   . COLONOSCOPY    . JOINT REPLACEMENT     bilat knee   . KNEE SURGERY Bilateral    x 2 joint knee replacements  . UPPER GASTROINTESTINAL ENDOSCOPY    . WISDOM TOOTH EXTRACTION      Social History   Socioeconomic History  . Marital status: Single    Spouse name: Not on file  . Number of children: 2  . Years  of education: Not on file  . Highest education level: Not on file  Occupational History  . Occupation: Disabled   Tobacco Use  . Smoking status: Never Smoker  . Smokeless tobacco: Never Used  Substance and Sexual Activity  . Alcohol use: Not Currently    Comment: socially  . Drug use: No  . Sexual activity: Not Currently    Birth control/protection: Post-menopausal  Other Topics Concern  . Not on file  Social History Narrative   [place] daily    Social Determinants of Health   Financial Resource Strain:   . Difficulty of Paying Living Expenses: Not on file  Food Insecurity:   . Worried About Charity fundraiser in the Last Year: Not on file  . Ran Out of Food in the Last Year: Not on file  Transportation Needs:   . Lack of Transportation (Medical): Not on file  . Lack of Transportation (Non-Medical): Not on file  Physical Activity:   . Days of Exercise per Week: Not on file  . Minutes of Exercise per Session: Not on file  Stress:   . Feeling of Stress : Not on file  Social Connections:   . Frequency of Communication with Friends and  Family: Not on file  . Frequency of Social Gatherings with Friends and Family: Not on file  . Attends Religious Services: Not on file  . Active Member of Clubs or Organizations: Not on file  . Attends Archivist Meetings: Not on file  . Marital Status: Not on file  Intimate Partner Violence:   . Fear of Current or Ex-Partner: Not on file  . Emotionally Abused: Not on file  . Physically Abused: Not on file  . Sexually Abused: Not on file    Current Outpatient Medications on File Prior to Visit  Medication Sig Dispense Refill  . albuterol (PROAIR HFA) 108 (90 BASE) MCG/ACT inhaler Inhale 2 puffs into the lungs every 4 (four) hours as needed for wheezing or shortness of breath.    Marland Kitchen amLODipine (NORVASC) 5 MG tablet Take 1 tablet (5 mg total) by mouth daily. 30 tablet 0  . aspirin EC 81 MG tablet Take 81 mg by mouth every morning.      . busPIRone (BUSPAR) 5 MG tablet Take 5 mg by mouth at bedtime.     . Calcium Carbonate-Vitamin D 600-400 MG-UNIT per tablet Take 1 tablet by mouth daily.    Marland Kitchen diltiazem (DILACOR XR) 180 MG 24 hr capsule Take 180 mg by mouth at bedtime.     Marland Kitchen esomeprazole (NEXIUM) 40 MG capsule Take 40 mg by mouth daily.     . folic acid (FOLVITE) 1 MG tablet Take 1 mg by mouth at bedtime.     . gabapentin (NEURONTIN) 100 MG capsule Take 100 mg by mouth daily.    Marland Kitchen gabapentin (NEURONTIN) 300 MG capsule Take 300 mg by mouth at bedtime.   5  . glucose blood (ACCU-CHEK AVIVA) test strip 1 each by Other route 2 (two) times daily. And lancets 2/day 100 each 12  . HUMIRA PEN 40 MG/0.4ML PNKT Inject 0.8 mLs into the skin every Wednesday.    . losartan (COZAAR) 100 MG tablet Take 100 mg by mouth daily.     Marland Kitchen lovastatin (MEVACOR) 20 MG tablet Take 2 tablets (40 mg total) by mouth daily at 6 PM. 30 tablet 0  . memantine (NAMENDA) 10 MG tablet Take 5 mg by mouth at bedtime.     . metFORMIN (GLUCOPHAGE) 1000 MG tablet Take 1 tablet (1,000 mg total) by mouth 2 (two) times daily with a meal. 180 tablet 3  . methocarbamol (ROBAXIN) 500 MG tablet Take 500 mg by mouth at bedtime.     . methotrexate 50 MG/2ML injection Inject 20 mg into the skin every Wednesday.     . nystatin cream (MYCOSTATIN) Apply 1 application topically daily as needed for dry skin.     Marland Kitchen OZEMPIC, 1 MG/DOSE, 2 MG/1.5ML SOPN INJECT 1 MG INTO THE SKIN ONCE A WEEK. 4 pen 1  . PEG-KCl-NaCl-NaSulf-Na Asc-C (PLENVU) 140 g SOLR Take 140 g by mouth as directed. 1 each 0  . pravastatin (PRAVACHOL) 20 MG tablet Take 20 mg by mouth daily.    . primidone (MYSOLINE) 250 MG tablet Take 250 mg by mouth daily.     . sertraline (ZOLOFT) 50 MG tablet Take 50 mg by mouth at bedtime.      No current facility-administered medications on file prior to visit.    Allergies  Allergen Reactions  . Lisinopril Cough    Family History  Problem Relation Age of Onset  .  Diabetes Other        maternal aunts and uncles  .  Diabetes Father   . Liver disease Father   . Emphysema Father        smoked  . Diabetes Sister   . Diabetes Brother   . Sarcoidosis Brother   . Emphysema Mother        smoked  . Asthma Mother   . Asthma Brother   . Colon cancer Neg Hx   . Esophageal cancer Neg Hx   . Stomach cancer Neg Hx   . Rectal cancer Neg Hx     BP 120/60 (BP Location: Left Arm, Patient Position: Sitting, Cuff Size: Normal)   Pulse 85   Ht 5' (1.524 m)   Wt 151 lb 9.6 oz (68.8 kg)   LMP  (LMP Unknown)   SpO2 94%   BMI 29.61 kg/m    Review of Systems She denies hypoglycemia.     Objective:   Physical Exam VITAL SIGNS:  See vs page GENERAL: no distress Pulses: dorsalis pedis intact bilat.   MSK: no deformity of the feet CV: no leg edema Skin:  no ulcer on the feet.  normal color and temp on the feet. Neuro: sensation is intact to touch on the feet Ext: there is bilateral onychomycosis of the toenails.     Lab Results  Component Value Date   HGBA1C 5.5 11/19/2019       Assessment & Plan:  Insulin-requiring type 2 DM, overcontrolled.   Patient Instructions  Please stop taking the lantus.   Please continue the same other diabetes medications.   check your blood sugar twice a day.  vary the time of day when you check, between before the 3 meals, and at bedtime.  also check if you have symptoms of your blood sugar being too high or too low.  please keep a record of the readings and bring it to your next appointment here (or you can bring the meter itself).  You can write it on any piece of paper.  please call us sooner if your blood sugar goes below 70, or if you have a lot of readings over 200.   Please come back for a follow-up appointment in 2 months.

## 2019-11-23 ENCOUNTER — Other Ambulatory Visit: Payer: Self-pay

## 2019-11-23 MED ORDER — GLUCOSE BLOOD VI STRP: 1.0000 | ORAL_STRIP | Freq: Two times a day (BID) | 11 refills | Status: DC

## 2019-11-24 ENCOUNTER — Other Ambulatory Visit: Payer: Self-pay

## 2019-11-24 DIAGNOSIS — E1142 Type 2 diabetes mellitus with diabetic polyneuropathy: Secondary | ICD-10-CM

## 2019-11-24 MED ORDER — ACCU-CHEK AVIVA VI SOLN: 1.0000 | 0 refills | Status: DC | PRN

## 2019-11-24 MED ORDER — GLUCOSE BLOOD VI STRP: 1.0000 | ORAL_STRIP | Freq: Two times a day (BID) | 0 refills | Status: DC

## 2019-11-24 MED ORDER — ACCU-CHEK SOFTCLIX LANCETS MISC: 1.0000 | Freq: Two times a day (BID) | 0 refills | Status: DC

## 2019-11-24 MED ORDER — ACCU-CHEK AVIVA PLUS W/DEVICE KIT: 1.0000 | PACK | Freq: Two times a day (BID) | 0 refills | Status: DC

## 2019-11-24 MED ORDER — BD SWAB SINGLE USE REGULAR PADS: 1.0000 | MEDICATED_PAD | Freq: Two times a day (BID) | 0 refills | Status: DC

## 2019-12-17 ENCOUNTER — Other Ambulatory Visit: Payer: Self-pay | Admitting: Endocrinology

## 2020-01-13 ENCOUNTER — Other Ambulatory Visit: Payer: Self-pay

## 2020-01-18 ENCOUNTER — Encounter: Payer: Self-pay | Admitting: Endocrinology

## 2020-01-18 ENCOUNTER — Ambulatory Visit (INDEPENDENT_AMBULATORY_CARE_PROVIDER_SITE_OTHER): Payer: Medicare HMO | Admitting: Endocrinology

## 2020-01-18 ENCOUNTER — Other Ambulatory Visit: Payer: Self-pay

## 2020-01-18 VITALS — BP 148/88 | HR 98 | Ht 60.0 in | Wt 152.0 lb

## 2020-01-18 DIAGNOSIS — E1142 Type 2 diabetes mellitus with diabetic polyneuropathy: Secondary | ICD-10-CM | POA: Diagnosis not present

## 2020-01-18 DIAGNOSIS — Z794 Long term (current) use of insulin: Secondary | ICD-10-CM

## 2020-01-18 LAB — POCT GLYCOSYLATED HEMOGLOBIN (HGB A1C): Hemoglobin A1C: 5.3 % (ref 4.0–5.6)

## 2020-01-18 NOTE — Progress Notes (Signed)
Subjective:    Patient ID: Carrie [name], female    DOB: 07-02-1956, 64 y.o.   MRN: [id number]  HPI Pt returns for f/u of diabetes mellitus: DM type: 2 Dx'ed: 2010. Complications: painful polyneuropathy, renal insuff, and gastroparesis.   Therapy: metformin and Ozempic.   GDM: 2000.  DKA: never. Severe hypoglycemia: never.  Pancreatitis: never.  SDOH: she was on multiple daily injections in the past, but could not remember to take.  Other: she did not tolerate invokana (vaginitis); psoriasis raises possibility she is evolving type 1 DM;  She wants to add more non-insulin rx, rather than increasing insulin; edema limits rx options.  She took insulin 2011-2121.  Interval history: pt states cbg's are well-controlled.  She says she never misses the medications. pt states she feels well in general.   Past Medical History:  Diagnosis Date  . Anxiety   . Arthritis    psoriatic arithritis, DDD   . Asthma   . Depression   . Diabetes mellitus   . Gastritis   . Gastroparesis   . GERD (gastroesophageal reflux disease)   . Hernia   . Hyperlipemia   . Hyperlipemia   . Hypertension   . Neuropathy    Bil feet re: to Diabetes  . Obesity   . Osteoporosis   . Plantar fasciitis   . Stricture and stenosis of esophagus   . TIA (transient ischemic attack)    8-10 yrs ago, no problems since  . Tuberculosis    tested positive 2011, no symptoms, was on medicine for 6 months    Past Surgical History:  Procedure Laterality Date  . BREAST SURGERY Bilateral    biopsies both breast  . CARDIAC CATHETERIZATION    . CESAREAN SECTION     x 2   . COLONOSCOPY    . JOINT REPLACEMENT     bilat knee   . KNEE SURGERY Bilateral    x 2 joint knee replacements  . UPPER GASTROINTESTINAL ENDOSCOPY    . WISDOM TOOTH EXTRACTION      Social History   Socioeconomic History  . Marital status: Single    Spouse name: Not on file  . Number of children: 2  . Years of education: Not on file  . Highest  education level: Not on file  Occupational History  . Occupation: Disabled   Tobacco Use  . Smoking status: Never Smoker  . Smokeless tobacco: Never Used  Substance and Sexual Activity  . Alcohol use: Not Currently    Comment: socially  . Drug use: No  . Sexual activity: Not Currently    Birth control/protection: Post-menopausal  Other Topics Concern  . Not on file  Social History Narrative   [place] daily    Social Determinants of Health   Financial Resource Strain:   . Difficulty of Paying Living Expenses:   Food Insecurity:   . Worried About Charity fundraiser in the Last Year:   . Arboriculturist in the Last Year:   Transportation Needs:   . Film/video editor (Medical):   Marland Kitchen Lack of Transportation (Non-Medical):   Physical Activity:   . Days of Exercise per Week:   . Minutes of Exercise per Session:   Stress:   . Feeling of Stress :   Social Connections:   . Frequency of Communication with Friends and Family:   . Frequency of Social Gatherings with Friends and Family:   . Attends Religious Services:   . Active  Member of Clubs or Organizations:   . Attends Archivist Meetings:   Marland Kitchen Marital Status:   Intimate Partner Violence:   . Fear of Current or Ex-Partner:   . Emotionally Abused:   Marland Kitchen Physically Abused:   . Sexually Abused:     Current Outpatient Medications on File Prior to Visit  Medication Sig Dispense Refill  . Accu-Chek Softclix Lancets lancets 1 each by Other route 2 (two) times daily. E11.9 200 each 0  . albuterol (PROAIR HFA) 108 (90 BASE) MCG/ACT inhaler Inhale 2 puffs into the lungs every 4 (four) hours as needed for wheezing or shortness of breath.    . Alcohol Swabs (B-D SINGLE USE SWABS REGULAR) PADS 1 each by Does not apply route 2 (two) times daily. Use to prep site prior to obtaining droplet of blood; E11.9 200 each 0  . amLODipine (NORVASC) 5 MG tablet Take 1 tablet (5 mg total) by mouth daily. 30 tablet 0  . aspirin EC 81 MG  tablet Take 81 mg by mouth every morning.    . Blood Glucose Calibration (ACCU-CHEK AVIVA) SOLN 1 each by Other route as needed (Use prn to calibrate glucometer). E11.9 1 each 0  . Blood Glucose Monitoring Suppl (ACCU-CHEK AVIVA PLUS) w/Device KIT 1 each by Does not apply route 2 (two) times daily. E11.9 1 kit 0  . busPIRone (BUSPAR) 5 MG tablet Take 5 mg by mouth at bedtime.     . Calcium Carbonate-Vitamin D 600-400 MG-UNIT per tablet Take 1 tablet by mouth daily.    Marland Kitchen diltiazem (DILACOR XR) 180 MG 24 hr capsule Take 180 mg by mouth at bedtime.     Marland Kitchen esomeprazole (NEXIUM) 40 MG capsule Take 40 mg by mouth daily.     . folic acid (FOLVITE) 1 MG tablet Take 1 mg by mouth at bedtime.     . gabapentin (NEURONTIN) 100 MG capsule Take 100 mg by mouth daily.    Marland Kitchen gabapentin (NEURONTIN) 300 MG capsule Take 300 mg by mouth at bedtime.   5  . glucose blood (ACCU-CHEK AVIVA) test strip 1 each by Other route 2 (two) times daily. E11.9 200 each 0  . HUMIRA PEN 40 MG/0.4ML PNKT Inject 0.8 mLs into the skin every Wednesday.    . losartan (COZAAR) 100 MG tablet Take 100 mg by mouth daily.     Marland Kitchen lovastatin (MEVACOR) 20 MG tablet Take 2 tablets (40 mg total) by mouth daily at 6 PM. 30 tablet 0  . memantine (NAMENDA) 10 MG tablet Take 5 mg by mouth at bedtime.     . metFORMIN (GLUCOPHAGE) 1000 MG tablet Take 1 tablet (1,000 mg total) by mouth 2 (two) times daily with a meal. 180 tablet 3  . methocarbamol (ROBAXIN) 500 MG tablet Take 500 mg by mouth at bedtime.     . methotrexate 50 MG/2ML injection Inject 20 mg into the skin every Wednesday.     . nystatin cream (MYCOSTATIN) Apply 1 application topically daily as needed for dry skin.     Marland Kitchen PEG-KCl-NaCl-NaSulf-Na Asc-C (PLENVU) 140 g SOLR Take 140 g by mouth as directed. 1 each 0  . pravastatin (PRAVACHOL) 20 MG tablet Take 20 mg by mouth daily.    . primidone (MYSOLINE) 250 MG tablet Take 250 mg by mouth daily.     . Semaglutide, 1 MG/DOSE, (OZEMPIC, 1 MG/DOSE,)  2 MG/1.5ML SOPN Inject 1 mg into the skin once a week. 4 pen 1  . sertraline (ZOLOFT)  50 MG tablet Take 50 mg by mouth at bedtime.      No current facility-administered medications on file prior to visit.    Allergies  Allergen Reactions  . Lisinopril Cough    Family History  Problem Relation Age of Onset  . Diabetes Other        maternal aunts and uncles  . Diabetes Father   . Liver disease Father   . Emphysema Father        smoked  . Diabetes Sister   . Diabetes Brother   . Sarcoidosis Brother   . Emphysema Mother        smoked  . Asthma Mother   . Asthma Brother   . Colon cancer Neg Hx   . Esophageal cancer Neg Hx   . Stomach cancer Neg Hx   . Rectal cancer Neg Hx     BP (!) 148/88   Pulse 98   Ht 5' (1.524 m)   Wt 152 lb (68.9 kg)   LMP  (LMP Unknown)   SpO2 95%   BMI 29.69 kg/m    Review of Systems Denies nausea    Objective:   Physical Exam VITAL SIGNS:  See vs page GENERAL: no distress Pulses: dorsalis pedis intact bilat.   MSK: no deformity of the feet CV: no leg edema Skin:  no ulcer on the feet.  normal color and temp on the feet. Neuro: sensation is intact to touch on the feet, but decreased from normal Ext: there is bilateral onychomycosis of the toenails   Lab Results  Component Value Date   HGBA1C 5.3 01/18/2020   Lab Results  Component Value Date   CREATININE 0.85 01/15/2019   BUN 15 01/15/2019   NA 137 01/15/2019   K 4.0 01/15/2019   CL 104 01/15/2019   CO2 21 (L) 01/15/2019       Assessment & Plan:  HTN: is noted today Type 2 DM: well-controlled  Patient Instructions  Your blood pressure is high today.  Please see your primary care provider soon, to have it rechecked Please continue the same medications check your blood sugar once a day.  vary the time of day when you check, between before the 3 meals, and at bedtime.  also check if you have symptoms of your blood sugar being too high or too low.  please keep a record of  the readings and bring it to your next appointment here (or you can bring the meter itself).  You can write it on any piece of paper.  please call us sooner if your blood sugar goes below 70, or if you have a lot of readings over 200.   Please come back for a follow-up appointment in 3-4 months.

## 2020-01-18 NOTE — Patient Instructions (Signed)
Your blood pressure is high today.  Please see your primary care provider soon, to have it rechecked Please continue the same medications check your blood sugar once a day.  vary the time of day when you check, between before the 3 meals, and at bedtime.  also check if you have symptoms of your blood sugar being too high or too low.  please keep a record of the readings and bring it to your next appointment here (or you can bring the meter itself).  You can write it on any piece of paper.  please call us sooner if your blood sugar goes below 70, or if you have a lot of readings over 200.   Please come back for a follow-up appointment in 3-4 months.

## 2020-02-08 ENCOUNTER — Other Ambulatory Visit: Payer: Self-pay

## 2020-02-08 ENCOUNTER — Inpatient Hospital Stay (HOSPITAL_COMMUNITY)
Admission: EM | Admit: 2020-02-08 | Discharge: 2020-02-12 | DRG: 389 | Disposition: A | Discharge status: Home / Self Care | Payer: Medicare HMO | Attending: Internal Medicine | Admitting: Internal Medicine

## 2020-02-08 ENCOUNTER — Emergency Department (HOSPITAL_COMMUNITY): Payer: Medicare HMO

## 2020-02-08 ENCOUNTER — Inpatient Hospital Stay (HOSPITAL_COMMUNITY): Payer: Medicare HMO

## 2020-02-08 ENCOUNTER — Encounter (HOSPITAL_COMMUNITY): Payer: Self-pay

## 2020-02-08 DIAGNOSIS — E1142 Type 2 diabetes mellitus with diabetic polyneuropathy: Secondary | ICD-10-CM | POA: Diagnosis present

## 2020-02-08 DIAGNOSIS — Z79899 Other long term (current) drug therapy: Secondary | ICD-10-CM

## 2020-02-08 DIAGNOSIS — E1151 Type 2 diabetes mellitus with diabetic peripheral angiopathy without gangrene: Secondary | ICD-10-CM | POA: Diagnosis present

## 2020-02-08 DIAGNOSIS — K56600 Partial intestinal obstruction, unspecified as to cause: Secondary | ICD-10-CM | POA: Diagnosis not present

## 2020-02-08 DIAGNOSIS — K56609 Unspecified intestinal obstruction, unspecified as to partial versus complete obstruction: Secondary | ICD-10-CM | POA: Diagnosis not present

## 2020-02-08 DIAGNOSIS — K551 Chronic vascular disorders of intestine: Secondary | ICD-10-CM | POA: Diagnosis not present

## 2020-02-08 DIAGNOSIS — E1165 Type 2 diabetes mellitus with hyperglycemia: Secondary | ICD-10-CM | POA: Diagnosis not present

## 2020-02-08 DIAGNOSIS — Z6829 Body mass index (BMI) 29.0-29.9, adult: Secondary | ICD-10-CM

## 2020-02-08 DIAGNOSIS — L4050 Arthropathic psoriasis, unspecified: Secondary | ICD-10-CM | POA: Diagnosis present

## 2020-02-08 DIAGNOSIS — K3184 Gastroparesis: Secondary | ICD-10-CM | POA: Diagnosis present

## 2020-02-08 DIAGNOSIS — K222 Esophageal obstruction: Secondary | ICD-10-CM | POA: Diagnosis present

## 2020-02-08 DIAGNOSIS — R1084 Generalized abdominal pain: Secondary | ICD-10-CM

## 2020-02-08 DIAGNOSIS — K219 Gastro-esophageal reflux disease without esophagitis: Secondary | ICD-10-CM | POA: Diagnosis present

## 2020-02-08 DIAGNOSIS — F419 Anxiety disorder, unspecified: Secondary | ICD-10-CM | POA: Diagnosis present

## 2020-02-08 DIAGNOSIS — M810 Age-related osteoporosis without current pathological fracture: Secondary | ICD-10-CM | POA: Diagnosis present

## 2020-02-08 DIAGNOSIS — F4325 Adjustment disorder with mixed disturbance of emotions and conduct: Secondary | ICD-10-CM | POA: Diagnosis present

## 2020-02-08 DIAGNOSIS — Z7984 Long term (current) use of oral hypoglycemic drugs: Secondary | ICD-10-CM

## 2020-02-08 DIAGNOSIS — IMO0002 Reserved for concepts with insufficient information to code with codable children: Secondary | ICD-10-CM | POA: Diagnosis present

## 2020-02-08 DIAGNOSIS — E785 Hyperlipidemia, unspecified: Secondary | ICD-10-CM | POA: Diagnosis present

## 2020-02-08 DIAGNOSIS — D72829 Elevated white blood cell count, unspecified: Secondary | ICD-10-CM | POA: Diagnosis present

## 2020-02-08 DIAGNOSIS — E1143 Type 2 diabetes mellitus with diabetic autonomic (poly)neuropathy: Secondary | ICD-10-CM | POA: Diagnosis present

## 2020-02-08 DIAGNOSIS — Z888 Allergy status to other drugs, medicaments and biological substances status: Secondary | ICD-10-CM

## 2020-02-08 DIAGNOSIS — F329 Major depressive disorder, single episode, unspecified: Secondary | ICD-10-CM | POA: Diagnosis present

## 2020-02-08 DIAGNOSIS — I10 Essential (primary) hypertension: Secondary | ICD-10-CM | POA: Diagnosis present

## 2020-02-08 DIAGNOSIS — Z20822 Contact with and (suspected) exposure to covid-19: Secondary | ICD-10-CM | POA: Diagnosis present

## 2020-02-08 DIAGNOSIS — Z7982 Long term (current) use of aspirin: Secondary | ICD-10-CM

## 2020-02-08 DIAGNOSIS — Z833 Family history of diabetes mellitus: Secondary | ICD-10-CM

## 2020-02-08 DIAGNOSIS — Z8611 Personal history of tuberculosis: Secondary | ICD-10-CM

## 2020-02-08 DIAGNOSIS — J45909 Unspecified asthma, uncomplicated: Secondary | ICD-10-CM | POA: Diagnosis present

## 2020-02-08 DIAGNOSIS — N179 Acute kidney failure, unspecified: Secondary | ICD-10-CM | POA: Diagnosis present

## 2020-02-08 DIAGNOSIS — F32A Depression, unspecified: Secondary | ICD-10-CM | POA: Diagnosis present

## 2020-02-08 DIAGNOSIS — E669 Obesity, unspecified: Secondary | ICD-10-CM | POA: Diagnosis present

## 2020-02-08 DIAGNOSIS — E119 Type 2 diabetes mellitus without complications: Secondary | ICD-10-CM | POA: Diagnosis present

## 2020-02-08 DIAGNOSIS — Z8673 Personal history of transient ischemic attack (TIA), and cerebral infarction without residual deficits: Secondary | ICD-10-CM

## 2020-02-08 DIAGNOSIS — E118 Type 2 diabetes mellitus with unspecified complications: Secondary | ICD-10-CM | POA: Diagnosis present

## 2020-02-08 DIAGNOSIS — Z96653 Presence of artificial knee joint, bilateral: Secondary | ICD-10-CM | POA: Diagnosis present

## 2020-02-08 DIAGNOSIS — Z0189 Encounter for other specified special examinations: Secondary | ICD-10-CM

## 2020-02-08 LAB — COMPREHENSIVE METABOLIC PANEL
ALT: 18 U/L (ref 0–44)
AST: 19 U/L (ref 15–41)
Albumin: 4.3 g/dL (ref 3.5–5.0)
Alkaline Phosphatase: 50 U/L (ref 38–126)
Anion gap: 11 (ref 5–15)
BUN: 17 mg/dL (ref 8–23)
CO2: 24 mmol/L (ref 22–32)
Calcium: 9.4 mg/dL (ref 8.9–10.3)
Chloride: 100 mmol/L (ref 98–111)
Creatinine, Ser: 0.85 mg/dL (ref 0.44–1.00)
GFR calc Af Amer: >60 mL/min (ref >60)
GFR calc non Af Amer: >60 mL/min (ref >60)
Glucose, Bld: 167 mg/dL — ABNORMAL HIGH (ref 70–99)
Potassium: 4.2 mmol/L (ref 3.5–5.1)
Sodium: 135 mmol/L (ref 135–145)
Total Bilirubin: 0.4 mg/dL (ref 0.3–1.2)
Total Protein: 8 g/dL (ref 6.5–8.1)

## 2020-02-08 LAB — GLUCOSE, CAPILLARY
Glucose-Capillary: 100 mg/dL — ABNORMAL HIGH (ref 70–99)
Glucose-Capillary: 100 mg/dL — ABNORMAL HIGH (ref 70–99)
Glucose-Capillary: 133 mg/dL — ABNORMAL HIGH (ref 70–99)

## 2020-02-08 LAB — URINALYSIS, ROUTINE W REFLEX MICROSCOPIC
Bilirubin Urine: NEGATIVE
Glucose, UA: NEGATIVE mg/dL
Hgb urine dipstick: NEGATIVE
Ketones, ur: NEGATIVE mg/dL
Nitrite: NEGATIVE
Protein, ur: NEGATIVE mg/dL
Specific Gravity, Urine: 1.017 (ref 1.005–1.030)
pH: 5 (ref 5.0–8.0)

## 2020-02-08 LAB — CBG MONITORING, ED: Glucose-Capillary: 110 mg/dL — ABNORMAL HIGH (ref 70–99)

## 2020-02-08 LAB — CBC
HCT: 44 % (ref 36.0–46.0)
Hemoglobin: 14.5 g/dL (ref 12.0–15.0)
MCH: 30.3 pg (ref 26.0–34.0)
MCHC: 33 g/dL (ref 30.0–36.0)
MCV: 92.1 fL (ref 80.0–100.0)
Platelets: 228 10*3/uL (ref 150–400)
RBC: 4.78 MIL/uL (ref 3.87–5.11)
RDW: 13.3 % (ref 11.5–15.5)
WBC: 17.9 10*3/uL — ABNORMAL HIGH (ref 4.0–10.5)
nRBC: 0 % (ref 0.0–0.2)

## 2020-02-08 LAB — LIPASE, BLOOD: Lipase: 50 U/L (ref 11–51)

## 2020-02-08 LAB — SARS CORONAVIRUS 2 (TAT 6-24 HRS): SARS Coronavirus 2: NEGATIVE

## 2020-02-08 MED ORDER — MORPHINE SULFATE (PF) 4 MG/ML IV SOLN
4.0000 mg | Freq: Once | INTRAVENOUS | Status: AC
  Administered 2020-02-08: 06:00:00 4 mg via INTRAVENOUS
  Filled 2020-02-08: qty 1

## 2020-02-08 MED ORDER — METOPROLOL TARTRATE 5 MG/5ML IV SOLN: 2.5000 mg | Freq: Four times a day (QID) | INTRAVENOUS | Status: DC | PRN

## 2020-02-08 MED ORDER — MORPHINE SULFATE (PF) 2 MG/ML IV SOLN
2.0000 mg | INTRAVENOUS | Status: DC | PRN
  Administered 2020-02-08: 2 mg via INTRAVENOUS
  Filled 2020-02-08: qty 1

## 2020-02-08 MED ORDER — HEPARIN SODIUM (PORCINE) 5000 UNIT/ML IJ SOLN
5000.0000 [IU] | Freq: Three times a day (TID) | INTRAMUSCULAR | Status: DC
  Administered 2020-02-08 – 2020-02-12 (×12): 5000 [IU] via SUBCUTANEOUS
  Filled 2020-02-08 (×12): qty 1

## 2020-02-08 MED ORDER — HYDROMORPHONE HCL 1 MG/ML IJ SOLN
0.5000 mg | INTRAMUSCULAR | Status: DC | PRN
  Administered 2020-02-08 – 2020-02-12 (×12): 1 mg via INTRAVENOUS
  Filled 2020-02-08 (×12): qty 1

## 2020-02-08 MED ORDER — SODIUM CHLORIDE 0.9 % IV SOLN: Freq: Once | INTRAVENOUS | Status: AC

## 2020-02-08 MED ORDER — DIATRIZOATE MEGLUMINE & SODIUM 66-10 % PO SOLN
90.0000 mL | Freq: Once | ORAL | Status: AC
  Administered 2020-02-08: 90 mL via NASOGASTRIC
  Filled 2020-02-08: qty 90

## 2020-02-08 MED ORDER — INSULIN ASPART 100 UNIT/ML ~~LOC~~ SOLN
0.0000 [IU] | SUBCUTANEOUS | Status: DC
  Administered 2020-02-08 – 2020-02-11 (×6): 1 [IU] via SUBCUTANEOUS

## 2020-02-08 MED ORDER — MORPHINE SULFATE (PF) 4 MG/ML IV SOLN
4.0000 mg | Freq: Once | INTRAVENOUS | Status: AC
  Administered 2020-02-08: 4 mg via INTRAVENOUS
  Filled 2020-02-08: qty 1

## 2020-02-08 MED ORDER — ONDANSETRON 4 MG PO TBDP
4.0000 mg | ORAL_TABLET | Freq: Once | ORAL | Status: AC
  Administered 2020-02-08: 4 mg via ORAL
  Filled 2020-02-08: qty 1

## 2020-02-08 MED ORDER — SODIUM CHLORIDE (PF) 0.9 % IJ SOLN
INTRAMUSCULAR | Status: AC
  Filled 2020-02-08: qty 50

## 2020-02-08 MED ORDER — ONDANSETRON HCL 4 MG PO TABS: 4.0000 mg | ORAL_TABLET | Freq: Four times a day (QID) | ORAL | Status: DC | PRN

## 2020-02-08 MED ORDER — IOHEXOL 300 MG/ML  SOLN
100.0000 mL | Freq: Once | INTRAMUSCULAR | Status: AC | PRN
  Administered 2020-02-08: 100 mL via INTRAVENOUS

## 2020-02-08 MED ORDER — SODIUM CHLORIDE 0.9% FLUSH
3.0000 mL | Freq: Once | INTRAVENOUS | Status: AC
  Administered 2020-02-08: 06:00:00 3 mL via INTRAVENOUS

## 2020-02-08 MED ORDER — ACETAMINOPHEN 325 MG PO TABS
650.0000 mg | ORAL_TABLET | Freq: Four times a day (QID) | ORAL | Status: DC | PRN
  Filled 2020-02-08: qty 2

## 2020-02-08 MED ORDER — ALBUTEROL SULFATE (2.5 MG/3ML) 0.083% IN NEBU: 2.5000 mg | INHALATION_SOLUTION | RESPIRATORY_TRACT | Status: DC | PRN

## 2020-02-08 MED ORDER — ACETAMINOPHEN 650 MG RE SUPP: 650.0000 mg | Freq: Four times a day (QID) | RECTAL | Status: DC | PRN

## 2020-02-08 MED ORDER — SODIUM CHLORIDE 0.9 % IV SOLN: INTRAVENOUS | Status: DC

## 2020-02-08 MED ORDER — ONDANSETRON HCL 4 MG/2ML IJ SOLN
4.0000 mg | Freq: Four times a day (QID) | INTRAMUSCULAR | Status: DC | PRN
  Administered 2020-02-08 – 2020-02-11 (×7): 4 mg via INTRAVENOUS
  Filled 2020-02-08 (×9): qty 2

## 2020-02-08 NOTE — H&P (Signed)
History and Physical    Carrie [name] GSU:[id number] DOB: 07-Jan-1956 DOA: 02/08/2020  PCP: Malachi Pro Pamalee Leyden, PA-C   Patient coming from: Home  I have personally briefly reviewed patient's old medical records in [place]  Chief Complaint: Abdominal pain  HPI: Carrie [name] is a 64 y.o. female with medical history significant of psoriasis on methotrexate, hypertension, hyperlipidemia, diabetes mellitus type 2 presented with abdominal pain.  Patient woke up with lower abdominal pain and periumbilical pain 2 days ago which subsequently eased off throughout the day however at night, her pain came back and was severe in nature, sharp, up to 8 out of 10 in intensity, diffuse along with nausea and vomiting.  Her pain continued to get worse last night along with nausea and vomiting so she presented to the ED.  She denies fevers, chills, chest pain, shortness of breath, cough, dysuria, loss of consciousness or seizures.  She tried Gas-X and Pepto-Bismol without any relief.   ED Course: She was found to have leukocytosis with WBCs of 17.9.  CT of the abdomen and pelvis with contrast showed findings of partial small bowel obstruction; suspect cirrhosis; severe SMA stenosis.  Review of Systems: As per HPI otherwise all other systems were reviewed and are negative.   Past Medical History:  Diagnosis Date   Anxiety    Arthritis    psoriatic arithritis, DDD    Asthma    Depression    Diabetes mellitus    Gastritis    Gastroparesis    GERD (gastroesophageal reflux disease)    Hernia    Hyperlipemia    Hyperlipemia    Hypertension    Neuropathy    Bil feet re: to Diabetes   Obesity    Osteoporosis    Plantar fasciitis    Stricture and stenosis of esophagus    TIA (transient ischemic attack)    8-10 yrs ago, no problems since   Tuberculosis    tested positive 2011, no symptoms, was on medicine for 6 months    Past Surgical History:  Procedure Laterality Date    BREAST SURGERY Bilateral    biopsies both breast   CARDIAC CATHETERIZATION     CESAREAN SECTION     x 2    COLONOSCOPY     JOINT REPLACEMENT     bilat knee    KNEE SURGERY Bilateral    x 2 joint knee replacements   UPPER GASTROINTESTINAL ENDOSCOPY     WISDOM TOOTH EXTRACTION     Social history  reports that she has never smoked. She has never used smokeless tobacco. She reports previous alcohol use. She reports that she does not use drugs.  Allergies  Allergen Reactions   Lisinopril Cough    Family History  Problem Relation Age of Onset   Diabetes Other        maternal aunts and uncles   Diabetes Father    Liver disease Father    Emphysema Father        smoked   Diabetes Sister    Diabetes Brother    Sarcoidosis Brother    Emphysema Mother        smoked   Asthma Mother    Asthma Brother    Colon cancer Neg Hx    Esophageal cancer Neg Hx    Stomach cancer Neg Hx    Rectal cancer Neg Hx     Prior to Admission medications   Medication Sig Start Date End Date Taking? Authorizing  Provider  acetaminophen (TYLENOL) 325 MG tablet Take 325-650 mg by mouth every 6 (six) hours as needed for mild pain or headache.   Yes [provider]  albuterol (PROAIR HFA) 108 (90 BASE) MCG/ACT inhaler Inhale 2 puffs into the lungs every 4 (four) hours as needed for wheezing or shortness of breath.   Yes [provider]  benzonatate (TESSALON) 200 MG capsule Take 200 mg by mouth 3 (three) times daily as needed for cough. 01/22/20  Yes [provider]  busPIRone (BUSPAR) 5 MG tablet Take 5 mg by mouth at bedtime.    Yes [provider]  diclofenac Sodium (VOLTAREN) 1 % GEL Apply 4 g topically 4 (four) times daily as needed for muscle pain. 01/14/20  Yes [provider]  diltiazem (CARDIZEM CD) 180 MG 24 hr capsule Take 180 mg by mouth daily. 01/19/20  Yes [provider]  esomeprazole (NEXIUM) 20 MG capsule Take 20 mg by  mouth every morning. 01/18/20  Yes [provider]  folic acid (FOLVITE) 1 MG tablet Take 1 mg by mouth at bedtime.    Yes [provider]  gabapentin (NEURONTIN) 300 MG capsule Take 300 mg by mouth at bedtime.  04/26/15  Yes [provider]  HUMIRA PEN 40 MG/0.4ML PNKT Inject 0.8 mLs into the skin every Wednesday. 01/01/19  Yes [provider]  memantine (NAMENDA) 10 MG tablet Take 10 mg by mouth at bedtime.    Yes [provider]  metFORMIN (GLUCOPHAGE) 1000 MG tablet Take 1 tablet (1,000 mg total) by mouth 2 (two) times daily with a meal. 03/25/19  Yes Renato Shin, MD  methocarbamol (ROBAXIN) 500 MG tablet Take 500 mg by mouth at bedtime.  10/20/18  Yes [provider]  methotrexate 50 MG/2ML injection Inject 20 mg into the skin once a week.    Yes [provider]  nystatin cream (MYCOSTATIN) Apply 1 application topically 2 (two) times daily as needed for dry skin (yeast infection on breast).  12/17/18  Yes [provider]  olmesartan (BENICAR) 40 MG tablet Take 40 mg by mouth daily. 01/26/20  Yes [provider]  pravastatin (PRAVACHOL) 20 MG tablet Take 20 mg by mouth daily.   Yes [provider]  primidone (MYSOLINE) 250 MG tablet Take 250 mg by mouth in the morning and at bedtime.    Yes [provider]  promethazine-dextromethorphan (PROMETHAZINE-DM) 6.25-15 MG/5ML syrup Take 5 mLs by mouth 4 (four) times daily as needed for cough. 01/22/20  Yes [provider]  Semaglutide, 1 MG/DOSE, (OZEMPIC, 1 MG/DOSE,) 2 MG/1.5ML SOPN Inject 1 mg into the skin once a week. 12/17/19  Yes Renato Shin, MD  sertraline (ZOLOFT) 50 MG tablet Take 75 mg by mouth at bedtime.    Yes [provider]  Accu-Chek Softclix Lancets lancets 1 each by Other route 2 (two) times daily. E11.9 11/24/19   Renato Shin, MD  Alcohol Swabs (B-D SINGLE USE SWABS REGULAR) PADS 1 each by Does not apply route 2 (two) times  daily. Use to prep site prior to obtaining droplet of blood; E11.9 11/24/19   Renato Shin, MD  amLODipine (NORVASC) 5 MG tablet Take 1 tablet (5 mg total) by mouth daily. Patient not taking: Reported on 02/08/2020 09/30/15   Dhungel, Flonnie Overman, MD  aspirin EC 81 MG tablet Take 81 mg by mouth every morning.    [provider]  Blood Glucose Calibration (ACCU-CHEK AVIVA) SOLN 1 each by Other route as needed (  Use prn to calibrate glucometer). E11.9 11/24/19   Renato Shin, MD  Blood Glucose Monitoring Suppl (ACCU-CHEK AVIVA PLUS) w/Device KIT 1 each by Does not apply route 2 (two) times daily. E11.9 11/24/19   Renato Shin, MD  Calcium Carbonate-Vitamin D 600-400 MG-UNIT per tablet Take 1 tablet by mouth daily. 04/26/15   [provider]  doxycycline (VIBRA-TABS) 100 MG tablet Take 100 mg by mouth 2 (two) times daily. 10 day supply 01/22/20   [provider]  glucose blood (ACCU-CHEK AVIVA) test strip 1 each by Other route 2 (two) times daily. E11.9 11/24/19   Renato Shin, MD  lovastatin (MEVACOR) 20 MG tablet Take 2 tablets (40 mg total) by mouth daily at 6 PM. 09/30/15   Dhungel, Nishant, MD  PEG-KCl-NaCl-NaSulf-Na Asc-C (PLENVU) 140 g SOLR Take 140 g by mouth as directed. Patient not taking: Reported on 02/08/2020 08/15/18   Doran Stabler, MD    Physical Exam: Vitals:   02/08/20 0508 02/08/20 0512 02/08/20 0630 02/08/20 0830  BP: (!) 148/96  119/81 121/78  Pulse: (!) 111 97 97 100  Resp: 17  16 18   Temp:      TempSrc:      SpO2: 94%  97% 94%  Weight:      Height:        Constitutional: NAD, calm, comfortable Vitals:   02/08/20 0508 02/08/20 0512 02/08/20 0630 02/08/20 0830  BP: (!) 148/96  119/81 121/78  Pulse: (!) 111 97 97 100  Resp: 17  16 18   Temp:      TempSrc:      SpO2: 94%  97% 94%  Weight:      Height:       Eyes: PERRL, lids and conjunctivae normal ENMT: Mucous membranes are dry.  Posterior pharynx clear of any exudate or lesions. Neck: normal,  supple, no masses, no thyromegaly Respiratory: bilateral decreased breath sounds at bases, no wheezing, no crackles. Normal respiratory effort. No accessory muscle use.  Cardiovascular: S1 S2 positive, rate controlled. No extremity edema. 2+ pedal pulses.  Abdomen: Diffuse abdominal tenderness, no rebound tenderness, no masses palpated. No hepatosplenomegaly. Bowel sounds sluggish. Musculoskeletal: no clubbing / cyanosis. No joint deformity upper and lower extremities.  Skin: no rashes, lesions, ulcers. No induration Neurologic: CN 2-12 grossly intact. Moving extremities. No focal neurologic deficits.  Psychiatric: Normal judgment and insight. Alert and oriented x 3. Normal mood.    Labs on Admission: I have personally reviewed following labs and imaging studies  CBC: Recent Labs  Lab 02/08/20 0116  WBC 17.9*  HGB 14.5  HCT 44.0  MCV 92.1  PLT 347   Basic Metabolic Panel: Recent Labs  Lab 02/08/20 0116  NA 135  K 4.2  CL 100  CO2 24  GLUCOSE 167*  BUN 17  CREATININE 0.85  CALCIUM 9.4   GFR: Estimated Creatinine Clearance: 59.3 mL/min (by C-G formula based on SCr of 0.85 mg/dL). Liver Function Tests: Recent Labs  Lab 02/08/20 0116  AST 19  ALT 18  ALKPHOS 50  BILITOT 0.4  PROT 8.0  ALBUMIN 4.3   Recent Labs  Lab 02/08/20 0116  LIPASE 50   No results for input(s): AMMONIA in the last 168 hours. Coagulation Profile: No results for input(s): INR, PROTIME in the last 168 hours. Cardiac Enzymes: No results for input(s): CKTOTAL, CKMB, CKMBINDEX, TROPONINI in the last 168 hours. BNP (last 3 results) No results for input(s): PROBNP in the last 8760 hours. HbA1C: No results for  input(s): HGBA1C in the last 72 hours. CBG: No results for input(s): GLUCAP in the last 168 hours. Lipid Profile: No results for input(s): CHOL, HDL, LDLCALC, TRIG, CHOLHDL, LDLDIRECT in the last 72 hours. Thyroid Function Tests: No results for input(s): TSH, T4TOTAL, FREET4, T3FREE,  THYROIDAB in the last 72 hours. Anemia Panel: No results for input(s): VITAMINB12, FOLATE, FERRITIN, TIBC, IRON, RETICCTPCT in the last 72 hours. Urine analysis:    Component Value Date/Time   COLORURINE YELLOW 02/08/2020 0620   APPEARANCEUR CLEAR 02/08/2020 0620   LABSPEC 1.017 02/08/2020 0620   PHURINE 5.0 02/08/2020 0620   GLUCOSEU NEGATIVE 02/08/2020 0620   HGBUR NEGATIVE 02/08/2020 0620   BILIRUBINUR NEGATIVE 02/08/2020 0620   KETONESUR NEGATIVE 02/08/2020 0620   PROTEINUR NEGATIVE 02/08/2020 0620   UROBILINOGEN 0.2 06/25/2015 1600   NITRITE NEGATIVE 02/08/2020 0620   LEUKOCYTESUR TRACE (A) 02/08/2020 0620    Radiological Exams on Admission: CT Abdomen Pelvis W Contrast  Result Date: 02/08/2020 CLINICAL DATA:  Generalized lower abdominal pain EXAM: CT ABDOMEN AND PELVIS WITH CONTRAST TECHNIQUE: Multidetector CT imaging of the abdomen and pelvis was performed using the standard protocol following bolus administration of intravenous contrast. CONTRAST:  124m OMNIPAQUE IOHEXOL 300 MG/ML  SOLN COMPARISON:  09/16/2013 FINDINGS: Lower chest:  No contributory findings. Hepatobiliary: Subtle surface lobulation of the liver with recanalized umbilical vein. Caudate lobe and fissure size is also prominent.No evidence of biliary obstruction or stone. Pancreas: Unremarkable. Spleen: Unremarkable. 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 No hydronephrosis or stone. Renal cortical lobulation on the both sides attributed to scarring. Unremarkable bladder. Stomach/Bowel: Small bowel is fluid-filled and distended with distal fecalization and mild mesenteric edema/interloop fluid. There is a relative transition at the level of the distal ileum where the small bowel is folded but no tight twist is noted. Stool is present in the proximal colon. No underlying bowel inflammation or masslike enhancement. Vascular/Lymphatic: No acute vascular abnormality. Atherosclerotic calcification of  the aorta and branch vessels with a severe stenosis of the proximal SMA. No mass or adenopathy. Reproductive:No pathologic findings. Other: No ascites or pneumoperitoneum.  Fatty midline hernia. Musculoskeletal: No acute abnormalities. IMPRESSION: 1. Findings of partial small bowel obstruction with underwhelming transition point at the terminal ileum. 2. Suspect cirrhosis, please correlate with risk factors. 3. Atherosclerosis with notably severe SMA stenosis. Electronically Signed   By: JMonte FantasiaM.D.   On: 02/08/2020 07:51    Assessment/Plan  Partial small bowel obstruction -Presented with abdominal pain and vomiting.  CT of the abdomen and pelvis is suggestive of partial small bowel obstruction -General surgery has been consulted and recommend admission under hospitalist service.  Follow general surgery recommendations. -Currently not vomiting.  Will wait for general surgery recommendations regarding NG tube -IV fluids/pain management/n.p.o.  Leukocytosis -Probably reactive from above  Probable cirrhosis of liver -Incidental finding on CT of the abdomen and pelvis.  Outpatient follow-up with GI  Severe SMA stenosis -Noted on CT of the abdomen and pelvis.  Spoke to Dr. CApolonio Schneiderssurgery who reviewed the CAT scan and thinks that this is a chronic findings with SMA calcification and her other abdominal blood vessels look okay.  Patient can follow-up with vascular surgery as an outpatient.  Diabetes mellitus type 2 -CBGs with SSI.  Hypertension -Monitor blood pressure.  Use IV antihypertensives as needed  History of psoriatic arthritis on methotrexate -Outpatient follow-up.  Hyperlipidemia -Resume statin once able to take orally  DVT prophylaxis: Subcutaneous heparin Code Status: Full Family Communication:  Patient at bedside Disposition Plan: Will probably remain inpatient for 2 to 3 days pending clinical improvement and general surgery input Consults called: General  surgery by ED provider/spoke to vascular surgery Dr. Carlis Abbott on phone Admission status: Inpatient/MedSurg  Severity of Illness: The appropriate patient status for this patient is INPATIENT. Inpatient status is judged to be reasonable and necessary in order to provide the required intensity of service to ensure the patient's safety. The patient's presenting symptoms, physical exam findings, and initial radiographic and laboratory data in the context of their chronic comorbidities is felt to place them at high risk for further clinical deterioration. Furthermore, it is not anticipated that the patient will be medically stable for discharge from the hospital within 2 midnights of admission. The following factors support the patient status of inpatient.   " The patient's presenting symptoms include abdominal pain and vomiting. " The worrisome physical exam findings include abdominal tenderness/sluggish bowel sounds. " The initial radiographic and laboratory data are worrisome because of partial small bowel obstruction/leukocytosis. " The chronic co-morbidities include diabetes mellitus type 2/hypertension/psoriatic arthritis on methotrexate.   * I certify that at the point of admission it is my clinical judgment that the patient will require inpatient hospital care spanning beyond 2 midnights from the point of admission due to high intensity of service, high risk for further deterioration and high frequency of surveillance required.Aline August MD [facility]  02/08/2020, 9:36 AM

## 2020-02-08 NOTE — ED Notes (Signed)
Attempted blood draw. Unsuccessful

## 2020-02-08 NOTE — Consult Note (Signed)
Carrie [name] 10-16-1955  [id number].    Requesting MD: Dr. April Palumbo Chief Complaint/Reason for Consult: abdominal pain, PSBO  HPI:  This is a 64 yo white female with a history of PVD, H/O TIA, DM, HTN, HLD, and gastroparesis who ate supper last night around 700pm.  Around 1100pm right before bed she developed acute severe abdominal pain particularly in the RLQ.  She had one episode of N/V, but none since that time.  She had a BM last night as well.  No flatus this am.  Nothing made her pain any better.  She denies fevers or chills.  She presented to the ED secondary to severe pain.  She has been found to have a WBC of 17.9K as well as a CT scan that reveals a possible ileus vs PSBO with no transition point noted, but also with severe SMA stenosis.  We have been asked to evaluate the patient for further recommendations.  ROS: ROS: Please see HPI, otherwise all other systems have been reviewed and are negative.  Family History  Problem Relation Age of Onset  . Diabetes Other        maternal aunts and uncles  . Diabetes Father   . Liver disease Father   . Emphysema Father        smoked  . Diabetes Sister   . Diabetes Brother   . Sarcoidosis Brother   . Emphysema Mother        smoked  . Asthma Mother   . Asthma Brother   . Colon cancer Neg Hx   . Esophageal cancer Neg Hx   . Stomach cancer Neg Hx   . Rectal cancer Neg Hx     Past Medical History:  Diagnosis Date  . Anxiety   . Arthritis    psoriatic arithritis, DDD   . Asthma   . Depression   . Diabetes mellitus   . Gastritis   . Gastroparesis   . GERD (gastroesophageal reflux disease)   . Hernia   . Hyperlipemia   . Hyperlipemia   . Hypertension   . Neuropathy    Bil feet re: to Diabetes  . Obesity   . Osteoporosis   . Plantar fasciitis   . Stricture and stenosis of esophagus   . TIA (transient ischemic attack)    8-10 yrs ago, no problems since  . Tuberculosis    tested positive 2011, no  symptoms, was on medicine for 6 months    Past Surgical History:  Procedure Laterality Date  . BREAST SURGERY Bilateral    biopsies both breast  . CARDIAC CATHETERIZATION    . CESAREAN SECTION     x 2   . COLONOSCOPY    . JOINT REPLACEMENT     bilat knee   . KNEE SURGERY Bilateral    x 2 joint knee replacements  . UPPER GASTROINTESTINAL ENDOSCOPY    . WISDOM TOOTH EXTRACTION      Social History:  reports that she has never smoked. She has never used smokeless tobacco. She reports previous alcohol use. She reports that she does not use drugs.  Allergies:  Allergies  Allergen Reactions  . Lisinopril Cough    (Not in a hospital admission)    Physical Exam: Blood pressure 121/78, pulse 100, temperature 98 F (36.7 C), temperature source Oral, resp. rate 18, height 5' (1.524 m), weight 68.5 kg, SpO2 94 %. General: pleasant, WD, WN white female who is laying in bed in  NAD, just had pain meds HEENT: head is normocephalic, atraumatic.  Sclera are noninjected.  PERRL.  Ears and nose without any masses or lesions.  Mouth is pink and moist Heart: regular, rate, and rhythm.  Normal s1,s2. No obvious murmurs, gallops, or rubs noted.  Palpable radial and pedal pulses bilaterally Lungs: CTAB, no wheezes, rhonchi, or rales noted.  Respiratory effort nonlabored Abd: soft, tender in epigastrium around right side and down to her RLQ, ND, +BS, no masses, hernias, or organomegaly.  No peritonitis  MS: all 4 extremities are symmetrical with no cyanosis, clubbing, or edema. Skin: warm and dry with no masses, lesions, or rashes Neuro: Cranial nerves 2-12 grossly intact, sensation is normal throughout Psych: A&Ox3 with an appropriate affect.   Results for orders placed or performed during the hospital encounter of 02/08/20 (from the past 48 hour(s))  Lipase, blood     Status: None   Collection Time: 02/08/20  1:16 AM  Result Value Ref Range   Lipase 50 11 - 51 U/L    Comment: Performed at  [facility], [place] [address] [place], [place] [address]  Comprehensive metabolic panel     Status: Abnormal   Collection Time: 02/08/20  1:16 AM  Result Value Ref Range   Sodium 135 135 - 145 mmol/L   Potassium 4.2 3.5 - 5.1 mmol/L   Chloride 100 98 - 111 mmol/L   CO2 24 22 - 32 mmol/L   Glucose, Bld 167 (H) 70 - 99 mg/dL    Comment: Glucose reference range applies only to samples taken after fasting for at least 8 hours.   BUN 17 8 - 23 mg/dL   Creatinine, Ser 0.85 0.44 - 1.00 mg/dL   Calcium 9.4 8.9 - 10.3 mg/dL   Total Protein 8.0 6.5 - 8.1 g/dL   Albumin 4.3 3.5 - 5.0 g/dL   AST 19 15 - 41 U/L   ALT 18 0 - 44 U/L   Alkaline Phosphatase 50 38 - 126 U/L   Total Bilirubin 0.4 0.3 - 1.2 mg/dL   GFR calc non Af Amer >60 >60 mL/min   GFR calc Af Amer >60 >60 mL/min   Anion gap 11 5 - 15    Comment: Performed at [facility], [place] [address] [place], [place] [address]  CBC     Status: Abnormal   Collection Time: 02/08/20  1:16 AM  Result Value Ref Range   WBC 17.9 (H) 4.0 - 10.5 K/uL   RBC 4.78 3.87 - 5.11 MIL/uL   Hemoglobin 14.5 12.0 - 15.0 g/dL   HCT 44.0 36.0 - 46.0 %   MCV 92.1 80.0 - 100.0 fL   MCH 30.3 26.0 - 34.0 pg   MCHC 33.0 30.0 - 36.0 g/dL   RDW 13.3 11.5 - 15.5 %   Platelets 228 150 - 400 K/uL   nRBC 0.0 0.0 - 0.2 %    Comment: Performed at [facility], [place] [address], [place],  [address]  Urinalysis, Routine w reflex microscopic     Status: Abnormal   Collection Time: 02/08/20  6:20 AM  Result Value Ref Range   Color, Urine YELLOW YELLOW   APPearance CLEAR CLEAR   Specific Gravity, Urine 1.017 1.005 - 1.030   pH 5.0 5.0 - 8.0   Glucose, UA NEGATIVE NEGATIVE mg/dL   Hgb urine dipstick NEGATIVE NEGATIVE   Bilirubin Urine NEGATIVE NEGATIVE   Ketones, ur NEGATIVE NEGATIVE mg/dL   Protein, ur  NEGATIVE NEGATIVE mg/dL   Nitrite NEGATIVE NEGATIVE   Leukocytes,Ua TRACE (A) NEGATIVE   RBC / HPF 0-5  0 - 5 RBC/hpf   WBC, UA 0-5 0 - 5 WBC/hpf   Bacteria, UA RARE (A) NONE SEEN   Squamous Epithelial / LPF 0-5 0 - 5   Mucus PRESENT    Hyaline Casts, UA PRESENT     Comment: Performed at [facility], [place] [address] [place], [place] [address]   CT Abdomen Pelvis W Contrast  Result Date: 02/08/2020 CLINICAL DATA:  Generalized lower abdominal pain EXAM: CT ABDOMEN AND PELVIS WITH CONTRAST TECHNIQUE: Multidetector CT imaging of the abdomen and pelvis was performed using the standard protocol following bolus administration of intravenous contrast. CONTRAST:  1104m OMNIPAQUE IOHEXOL 300 MG/ML  SOLN COMPARISON:  09/16/2013 FINDINGS: Lower chest:  No contributory findings. Hepatobiliary: Subtle surface lobulation of the liver with recanalized umbilical vein. Caudate lobe and fissure size is also prominent.No evidence of biliary obstruction or stone. Pancreas: Unremarkable. Spleen: Unremarkable. 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 No hydronephrosis or stone. Renal cortical lobulation on the both sides attributed to scarring. Unremarkable bladder. Stomach/Bowel: Small bowel is fluid-filled and distended with distal fecalization and mild mesenteric edema/interloop fluid. There is a relative transition at the level of the distal ileum where the small bowel is folded but no tight twist is noted. Stool is present in the proximal colon. No underlying bowel inflammation or masslike enhancement. Vascular/Lymphatic: No acute vascular abnormality. Atherosclerotic calcification of the aorta and branch vessels with a severe stenosis of the proximal SMA. No mass or adenopathy. Reproductive:No pathologic findings. Other: No ascites or pneumoperitoneum.  Fatty midline hernia. Musculoskeletal: No acute abnormalities. IMPRESSION: 1. Findings of partial small bowel obstruction with underwhelming transition point at the terminal ileum. 2. Suspect cirrhosis, please correlate with risk  factors. 3. Atherosclerosis with notably severe SMA stenosis. Electronically Signed   By: JMonte FantasiaM.D.   On: 02/08/2020 07:51      Assessment/Plan PVD, H/O TIA - on daily ASA HTN HLD DM Gastroparesis  Abdominal pain, secondary to psbo vs SMA stenosis The patient's imaging has been reviewed by myself and Dr. GHarlow Asa  Vascular surgery has also been called, but they felt like the stenosis was chronic and that may have some collaterals and didn't feel as if acute evaluation was necessary.  They recommended outpatient follow up.  However, the patient is moving her bowels and her CT scan shows no transition zone.  Uncommon for an ileus vs psbo to cause such severe and acute onset of abdominal pain.  We will give her contrast to drink for the SBO protocol and follow her 8-hr delay film.  WBC of 17.9 is a little concerning given her symptoms and CT scan findings.  I do not think she has an acute abdomen or frankly ischemic bowel.  Will trend WBC to follow this.  For now she also needs bowel rest.  We will continue to follow the patient while here.   FEN - NPO VTE - ok for chemical prophylaxis from our standpoint ID - none currently needed, follow WBC  KHenreitta Cea [facility] 02/08/2020, 10:05 AM Please see Amion for pager number during day hours 7:00am-4:30pm or 7:00am -11:30am on weekends

## 2020-02-08 NOTE — ED Notes (Signed)
Pt has fallen asleep. She is resting comfortably. Zofran delayed.

## 2020-02-08 NOTE — ED Triage Notes (Signed)
Pt BIB [facility] from home for generalized lower abdominal pain. Hx of hernia and gastroparesis. She is scheduled to have shoulder surgery on Wednesday so she was instructed not to take pain mecication. She attempted to treat it with Gas X and Pepto without relief.

## 2020-02-08 NOTE — Plan of Care (Signed)
POC initiated 

## 2020-02-08 NOTE — ED Provider Notes (Addendum)
[facility] DEPT Provider Note   CSN: [id number] Arrival date & time: 02/08/20  0104     History Chief Complaint  Patient presents with  . Abdominal Pain    Carrie [name] is a 64 y.o. female with history of psoriasis on methotrexate, hypertension, hyperlipidemia, diabetes who presents with abdominal pain.  States that she woke up with lower abdominal and periumbilical abdominal pain 2 days ago.  The pain eased off throughout the day however at night when she went to lie down she had severe pain radiating throughout her abdomen.  She reports associated nausea and vomiting which was food contents.  Pain persisted today became severe at night again and therefore she decided to come to the ED.  She denies fever, chills, chest pain, shortness of breath, cough, diarrhea, constipation, urinary symptoms.  She is not sexually active.  States she tried GasX and Pepto-Bismol without relief.  Past surgical history significant for C-section and laparoscopic surgery.  She has had a colonoscopy which showed polyps and diverticulosis  HPI     Past Medical History:  Diagnosis Date  . Anxiety   . Arthritis    psoriatic arithritis, DDD   . Asthma   . Depression   . Diabetes mellitus   . Gastritis   . Gastroparesis   . GERD (gastroesophageal reflux disease)   . Hernia   . Hyperlipemia   . Hyperlipemia   . Hypertension   . Neuropathy    Bil feet re: to Diabetes  . Obesity   . Osteoporosis   . Plantar fasciitis   . Stricture and stenosis of esophagus   . TIA (transient ischemic attack)    8-10 yrs ago, no problems since  . Tuberculosis    tested positive 2011, no symptoms, was on medicine for 6 months    Patient Active Problem List   Diagnosis Date Noted  . 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 Psoriasis 07/08/2017  . Diabetes ([place]) 02/16/2016  . Chest pain 12/04/2013  . Diabetes mellitus type II, uncontrolled  ([place]) 12/04/2013  . HTN (hypertension) 12/04/2013  . Hypoglycemia 12/04/2013  . Pyelonephritis 10/10/2013  . UTI (lower urinary tract infection) 10/10/2013  . HBP (high blood pressure) 04/21/2013  . Gastroparesis 02/25/2013  . Cough 02/25/2013  . Nausea and vomiting 01/06/2013  . Nausea alone 05/15/2011  . STRICTURE AND STENOSIS OF ESOPHAGUS 10/21/2009  . Hyperlipidemia 01/09/2008  . OBESITY 01/09/2008  . Asthma 01/09/2008  . GERD 01/09/2008  . HERNIA, VENTRAL 01/09/2008  . ARTHRITIS 01/09/2008  . Depression 03/25/2007  . ANXIETY 03/25/2007    Past Surgical History:  Procedure Laterality Date  . BREAST SURGERY Bilateral    biopsies both breast  . CARDIAC CATHETERIZATION    . CESAREAN SECTION     x 2   . COLONOSCOPY    . JOINT REPLACEMENT     bilat knee   . KNEE SURGERY Bilateral    x 2 joint knee replacements  . UPPER GASTROINTESTINAL ENDOSCOPY    . WISDOM TOOTH EXTRACTION       OB History   No obstetric history on file.     Family History  Problem Relation Age of Onset  . Diabetes Other        maternal aunts and uncles  . Diabetes Father   . Liver disease Father   . Emphysema Father        smoked  . Diabetes Sister   .  Diabetes Brother   . Sarcoidosis Brother   . Emphysema Mother        smoked  . Asthma Mother   . Asthma Brother   . Colon cancer Neg Hx   . Esophageal cancer Neg Hx   . Stomach cancer Neg Hx   . Rectal cancer Neg Hx     Social History   Tobacco Use  . Smoking status: Never Smoker  . Smokeless tobacco: Never Used  Substance Use Topics  . Alcohol use: Not Currently    Comment: socially  . Drug use: No    Home Medications Prior to Admission medications   Medication Sig Start Date End Date Taking? Authorizing Provider  Accu-Chek Softclix Lancets lancets 1 each by Other route 2 (two) times daily. E11.9 11/24/19   Renato Shin, MD  albuterol (PROAIR HFA) 108 (90 BASE) MCG/ACT inhaler Inhale 2 puffs into the lungs every 4 (four)  hours as needed for wheezing or shortness of breath.    [provider]  Alcohol Swabs (B-D SINGLE USE SWABS REGULAR) PADS 1 each by Does not apply route 2 (two) times daily. Use to prep site prior to obtaining droplet of blood; E11.9 11/24/19   Renato Shin, MD  amLODipine (NORVASC) 5 MG tablet Take 1 tablet (5 mg total) by mouth daily. 09/30/15   Dhungel, Flonnie Overman, MD  aspirin EC 81 MG tablet Take 81 mg by mouth every morning.    [provider]  Blood Glucose Calibration (ACCU-CHEK AVIVA) SOLN 1 each by Other route as needed (Use prn to calibrate glucometer). E11.9 11/24/19   Renato Shin, MD  Blood Glucose Monitoring Suppl (ACCU-CHEK AVIVA PLUS) w/Device KIT 1 each by Does not apply route 2 (two) times daily. E11.9 11/24/19   Renato Shin, MD  busPIRone (BUSPAR) 5 MG tablet Take 5 mg by mouth at bedtime.     [provider]  Calcium Carbonate-Vitamin D 600-400 MG-UNIT per tablet Take 1 tablet by mouth daily. 04/26/15   [provider]  diltiazem (DILACOR XR) 180 MG 24 hr capsule Take 180 mg by mouth at bedtime.     [provider]  esomeprazole (NEXIUM) 40 MG capsule Take 40 mg by mouth daily.     [provider]  folic acid (FOLVITE) 1 MG tablet Take 1 mg by mouth at bedtime.     [provider]  gabapentin (NEURONTIN) 100 MG capsule Take 100 mg by mouth daily.    [provider]  gabapentin (NEURONTIN) 300 MG capsule Take 300 mg by mouth at bedtime.  04/26/15   [provider]  glucose blood (ACCU-CHEK AVIVA) test strip 1 each by Other route 2 (two) times daily. E11.9 11/24/19   Renato Shin, MD  HUMIRA PEN 40 MG/0.4ML PNKT Inject 0.8 mLs into the skin every Wednesday. 01/01/19   [provider]  losartan (COZAAR) 100 MG tablet Take 100 mg by mouth daily.     [provider]  lovastatin (MEVACOR) 20 MG tablet Take 2 tablets (40 mg total) by mouth daily at 6 PM. 09/30/15   Dhungel, Nishant, MD  memantine  (NAMENDA) 10 MG tablet Take 5 mg by mouth at bedtime.     [provider]  metFORMIN (GLUCOPHAGE) 1000 MG tablet Take 1 tablet (1,000 mg total) by mouth 2 (two) times daily with a meal. 03/25/19   Renato Shin, MD  methocarbamol (ROBAXIN) 500 MG tablet Take 500 mg by mouth at bedtime.  10/20/18   [provider]  methotrexate 50 MG/2ML injection Inject 20 mg into the skin every Wednesday.     [provider]  nystatin cream (MYCOSTATIN) Apply 1 application topically daily as needed for dry skin.  12/17/18   [provider]  PEG-KCl-NaCl-NaSulf-Na Asc-C (PLENVU) 140 g SOLR Take 140 g by mouth as directed. 08/15/18   Doran Stabler, MD  pravastatin (PRAVACHOL) 20 MG tablet Take 20 mg by mouth daily.    [provider]  primidone (MYSOLINE) 250 MG tablet Take 250 mg by mouth daily.     [provider]  Semaglutide, 1 MG/DOSE, (OZEMPIC, 1 MG/DOSE,) 2 MG/1.5ML SOPN Inject 1 mg into the skin once a week. 12/17/19   Renato Shin, MD  sertraline (ZOLOFT) 50 MG tablet Take 50 mg by mouth at bedtime.     [provider]    Allergies    Lisinopril  Review of Systems   Review of Systems  Constitutional: Negative for chills and fever.  Respiratory: Negative for shortness of breath.   Cardiovascular: Negative for chest pain.  Gastrointestinal: Positive for abdominal pain, nausea and vomiting. Negative for constipation and diarrhea.  Genitourinary: Negative for dysuria and flank pain.  All other systems reviewed and are negative.   Physical Exam Updated Vital Signs BP (!) 148/96 (BP Location: Right Arm)   Pulse 97   Temp 98 F (36.7 C) (Oral)   Resp 17   Ht 5' (1.524 m)   Wt 68.5 kg   LMP  (LMP Unknown)   SpO2 94%   BMI 29.49 kg/m   Physical Exam Vitals and nursing note reviewed.  Constitutional:      General: She is not in acute distress.    Appearance: She is well-developed. She is obese. She is not ill-appearing.      Comments: Calm and cooperative. NAD  HENT:     Head: Normocephalic and atraumatic.  Eyes:     General: No scleral icterus.       Right eye: No discharge.        Left eye: No discharge.     Conjunctiva/sclera: Conjunctivae normal.     Pupils: Pupils are equal, round, and reactive to light.  Cardiovascular:     Rate and Rhythm: Normal rate.  Pulmonary:     Effort: Pulmonary effort is normal. No respiratory distress.  Abdominal:     General: Abdomen is protuberant. Bowel sounds are normal. There is no distension.     Palpations: Abdomen is soft.     Tenderness: There is generalized abdominal tenderness. There is guarding.     Hernia: No hernia is present.  Musculoskeletal:     Cervical back: Normal range of motion.  Skin:    General: Skin is warm and dry.  Neurological:     Mental Status: She is alert and oriented to person, place, and time.  Psychiatric:        Behavior: Behavior normal.     ED Results / Procedures / Treatments   Labs (all labs ordered are listed, but only abnormal results are displayed) Labs Reviewed  COMPREHENSIVE METABOLIC PANEL - Abnormal; Notable for the following components:      Result Value   Glucose, Bld 167 (*)    All other components within normal limits  CBC - Abnormal; Notable for the following components:   WBC 17.9 (*)    All other components within normal limits  LIPASE, BLOOD  URINALYSIS, ROUTINE W REFLEX MICROSCOPIC    EKG None  Radiology  No results found.  Procedures Procedures (including critical care time)  Medications Ordered in ED Medications  sodium chloride flush (NS) 0.9 % injection 3 mL (has no administration in time range)  ondansetron (ZOFRAN-ODT) disintegrating tablet 4 mg (4 mg Oral Given 02/08/20 0422)  morphine 4 MG/ML injection 4 mg (4 mg Intravenous Given 02/08/20 0557)    ED Course  I have reviewed the triage vital signs and the nursing notes.  Pertinent labs & imaging results that were available during my  care of the patient were reviewed by me and considered in my medical decision making (see chart for details).  64 year old female presents with generalized abdominal pain for the past 2 days with associated nausea and vomiting.  She is mildly tachycardic but otherwise vital signs are normal.  Abdomen is soft but she is generally tender, more so across the lower abdomen and she is guarding labs are remarkable for leukocytosis of 17.  Will provide pain control and order CT of the abdomen and pelvis  CT is pending at shift change.  Care signed out to [facility] who will dispo.  MDM Rules/Calculators/A&P                       Final Clinical Impression(s) / ED Diagnoses Final diagnoses:  Generalized abdominal pain    Rx / DC Orders ED Discharge Orders    None       Recardo Evangelist, PA-C 02/08/20 5009    Palumbo, April, MD 02/08/20 0731    Recardo Evangelist, PA-C 02/08/20 [place], April, MD 02/10/20 2334

## 2020-02-08 NOTE — Progress Notes (Signed)
Pt had large amount of green emesis, went all over the bathroom.

## 2020-02-08 NOTE — ED Provider Notes (Signed)
  Physical Exam  BP 119/81 (BP Location: Right Arm)   Pulse 97   Temp 98 F (36.7 C) (Oral)   Resp 16   Ht 5' (1.524 m)   Wt 68.5 kg   LMP  (LMP Unknown)   SpO2 97%   BMI 29.49 kg/m   Physical Exam  ED Course/Procedures   Clinical Course as of Feb 07 1513  Mon Feb 08, 2020  1884 Patient CT scan showing SBO and also severe SMA stenosis. Patient currently is pain controlled. Spoke with Fortunato Curling from vascular surgery who reviewed patient CT and thought that the SMA stenosis was a chronic, incidental finding and her vasculature otherwise appeared patent. Spoke with Claiborne Billings PA from general surgery who will consult on this patient and would like hospitalist to admit   [KM]    Clinical Course User Index [KM] Alveria Apley, PA-C    Procedures  MDM  Patient care assumed from [facility] PA due to change of shift. Please see her note for full HPI. Briefly, patient with diffuse abdominal pain with elevated WBC count. CT abdomen pending.        Alveria Apley, PA-C 02/08/20 1515    Tegeler, Gwenyth Allegra, MD 02/08/20 [phone]

## 2020-02-09 ENCOUNTER — Inpatient Hospital Stay (HOSPITAL_COMMUNITY): Payer: Medicare HMO

## 2020-02-09 DIAGNOSIS — I10 Essential (primary) hypertension: Secondary | ICD-10-CM

## 2020-02-09 DIAGNOSIS — D72829 Elevated white blood cell count, unspecified: Secondary | ICD-10-CM

## 2020-02-09 DIAGNOSIS — E1165 Type 2 diabetes mellitus with hyperglycemia: Secondary | ICD-10-CM

## 2020-02-09 DIAGNOSIS — K56609 Unspecified intestinal obstruction, unspecified as to partial versus complete obstruction: Secondary | ICD-10-CM

## 2020-02-09 DIAGNOSIS — K56600 Partial intestinal obstruction, unspecified as to cause: Principal | ICD-10-CM

## 2020-02-09 DIAGNOSIS — R1084 Generalized abdominal pain: Secondary | ICD-10-CM

## 2020-02-09 LAB — CBC
HCT: 42.5 % (ref 36.0–46.0)
Hemoglobin: 13.4 g/dL (ref 12.0–15.0)
MCH: 29.9 pg (ref 26.0–34.0)
MCHC: 31.5 g/dL (ref 30.0–36.0)
MCV: 94.9 fL (ref 80.0–100.0)
Platelets: 214 10*3/uL (ref 150–400)
RBC: 4.48 MIL/uL (ref 3.87–5.11)
RDW: 13.6 % (ref 11.5–15.5)
WBC: 10.6 10*3/uL — ABNORMAL HIGH (ref 4.0–10.5)
nRBC: 0 % (ref 0.0–0.2)

## 2020-02-09 LAB — COMPREHENSIVE METABOLIC PANEL
ALT: 14 U/L (ref 0–44)
AST: 20 U/L (ref 15–41)
Albumin: 3.7 g/dL (ref 3.5–5.0)
Alkaline Phosphatase: 56 U/L (ref 38–126)
Anion gap: 9 (ref 5–15)
BUN: 22 mg/dL (ref 8–23)
CO2: 29 mmol/L (ref 22–32)
Calcium: 8.9 mg/dL (ref 8.9–10.3)
Chloride: 101 mmol/L (ref 98–111)
Creatinine, Ser: 1.32 mg/dL — ABNORMAL HIGH (ref 0.44–1.00)
GFR calc Af Amer: 50 mL/min — ABNORMAL LOW (ref >60)
GFR calc non Af Amer: 43 mL/min — ABNORMAL LOW (ref >60)
Glucose, Bld: 142 mg/dL — ABNORMAL HIGH (ref 70–99)
Potassium: 4.7 mmol/L (ref 3.5–5.1)
Sodium: 139 mmol/L (ref 135–145)
Total Bilirubin: 0.6 mg/dL (ref 0.3–1.2)
Total Protein: 7.4 g/dL (ref 6.5–8.1)

## 2020-02-09 LAB — HEMOGLOBIN A1C
Hgb A1c MFr Bld: 6 % — ABNORMAL HIGH (ref 4.8–5.6)
Mean Plasma Glucose: 125.5 mg/dL

## 2020-02-09 LAB — GLUCOSE, CAPILLARY
Glucose-Capillary: 103 mg/dL — ABNORMAL HIGH (ref 70–99)
Glucose-Capillary: 120 mg/dL — ABNORMAL HIGH (ref 70–99)
Glucose-Capillary: 122 mg/dL — ABNORMAL HIGH (ref 70–99)
Glucose-Capillary: 139 mg/dL — ABNORMAL HIGH (ref 70–99)
Glucose-Capillary: 140 mg/dL — ABNORMAL HIGH (ref 70–99)
Glucose-Capillary: 97 mg/dL (ref 70–99)

## 2020-02-09 LAB — MAGNESIUM: Magnesium: 2 mg/dL (ref 1.7–2.4)

## 2020-02-09 LAB — HIV ANTIBODY (ROUTINE TESTING W REFLEX): HIV Screen 4th Generation wRfx: NONREACTIVE

## 2020-02-09 MED ORDER — DIPHENHYDRAMINE HCL 25 MG PO CAPS
25.0000 mg | ORAL_CAPSULE | Freq: Four times a day (QID) | ORAL | Status: DC | PRN
  Administered 2020-02-09 – 2020-02-12 (×9): 25 mg via ORAL
  Filled 2020-02-09 (×9): qty 1

## 2020-02-09 MED ORDER — HYDROCORTISONE 1 % EX CREA
TOPICAL_CREAM | Freq: Two times a day (BID) | CUTANEOUS | Status: DC
  Filled 2020-02-09: qty 28

## 2020-02-09 NOTE — Progress Notes (Signed)
Pt with welts and itching to bil arms, no new medications x  Contrast given on day shift. Pt states hx of this and request benadryl, message sent to [facility] floor coverage

## 2020-02-09 NOTE — Plan of Care (Signed)
  Problem: Education: Goal: Knowledge of General Education information will improve Description: Including pain rating scale, medication(s)/side effects and non-pharmacologic comfort measures Outcome: Progressing   Problem: Elimination: Goal: Will not experience complications related to urinary retention Outcome: Progressing   Problem: Pain Managment: Goal: General experience of comfort will improve Outcome: Progressing

## 2020-02-09 NOTE — Progress Notes (Signed)
PROGRESS NOTE    Carrie [name]  YNW:[id number] DOB: [birth year] DOA: 02/08/2020 PCP: Drosinis, Pamalee Leyden, PA-C   Chief Complaint  Patient presents with  . Abdominal Pain    Brief Narrative:  64 year old lady with prior history of psoriasis on methotrexate, essential hypertension, type 2 diabetes mellitus, hyperlipidemia, anxiety, asthma, gastroparesis presents to ED with periumbilical abdominal pain associated with some nausea and vomiting.  CT of the abdomen and pelvis showed findings of small bowel obstruction along with severe SMA stenosis.  General surgery was consulted for partial SBO.  Admitting hospitalist discussed with vascular surgery regarding the severe SMA stenosis, Dr. Carlis Abbott reviewed the CT results and suggest that this is a chronic finding with SMA calcifications.  Recommend outpatient follow-up with vascular surgery on discharge.  Assessment & Plan:   Principal Problem:   Bowel obstruction (HCC) Active Problems:   Hyperlipidemia   Depression   Diabetes mellitus type II, uncontrolled ([place])   HTN (hypertension)   Leukocytosis   Superior mesenteric artery stenosis (HCC)   Partial small bowel obstruction Persistent, repeat abdominal film shows contrast now in the RIGHT colon excluding a complete bowel obstruction. Dilated small bowel loops again seen indicating partial small bowel obstruction.  Patient has persistent nausea no vomiting this morning but abdominal pain.  She is refusing NG tube placement at this time.  But if patient has persistent nausea and vomiting she will benefit from NG tube placement at this time.  General surgery on board. Patient denies any flatus or bowel movements this morning. Symptomatic management with IV antiemetics and pain medication.  Patient remains n.p.o.    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 Apolonio Schneiders surgery.  Recommend outpatient follow-up with vascular surgery on discharge.    Type 2 diabetes  mellitus CBG (last 3)  Recent Labs    02/09/20 0350 02/09/20 0733 02/09/20 1203  GLUCAP 140* 120* 103*   Continue with sliding scale insulin. Hemoglobin A1c around 6    Essential hypertension Blood pressure parameters are borderline normal.  Continue to monitor     Asthma No wheezing heard at this time   Mild leukocytosis Probably reactive, improving.    AKI Creatinine bumped from 0.8-1.35. Suspect probably secondary to poor oral intake/dehydration and poor renal perfusion Continue with IV fluids at 100 mL/h and repeat renal parameters in the morning.    DVT prophylaxis: Heparin Code Status: Full code Family Communication: None at bedside Disposition:   Status is: Inpatient  Remains inpatient appropriate because:IV treatments appropriate due to intensity of illness or inability to take PO   Dispo: The patient is from: Home              Anticipated d/c is to: Home              Anticipated d/c date is: 2 days              Patient currently is not medically stable to d/c.        Consultants:   General surgery  Procedures: None Antimicrobials: None  Subjective: Persistent abdominal pain associated with some nausea.  Is adamant about not getting an NG tube.  Objective: Vitals:   02/08/20 1318 02/08/20 1731 02/08/20 2210 02/08/20 2225  BP: 122/77 92/64 (!) 83/57 102/70  Pulse: 87 93 (!) 106 98  Resp:   17 17  Temp: 97.6 F (36.4 C) 98.3 F (36.8 C) 98 F (36.7 C)   TempSrc:  Oral Oral   SpO2:  96% 99% 91%   Weight:      Height:        Intake/Output Summary (Last 24 hours) at 02/09/2020 1214 Last data filed at 02/09/2020 0814 Gross per 24 hour  Intake 1969.78 ml  Output 400 ml  Net 1569.78 ml   Filed Weights   02/08/20 0125  Weight: 68.5 kg    Examination:  General exam: Alert, but appears uncomfortable, no distress noted.  Respiratory system: Clear to auscultation. Respiratory effort normal. Cardiovascular system: S1 & S2 heard,  RRR. No JVD,  No pedal edema. Gastrointestinal system: Abdomen is soft, generalized tenderness , mildly distended, bowel sounds hypoactive. Central nervous system: Alert and oriented. No focal neurological deficits. Extremities: Symmetric 5 x 5 power. Skin: No rashes, lesions or ulcers Psychiatry: Mood & affect appropriate.     Data Reviewed: I have personally reviewed following labs and imaging studies  CBC: Recent Labs  Lab 02/08/20 0116 02/09/20 0441  WBC 17.9* 10.6*  HGB 14.5 13.4  HCT 44.0 42.5  MCV 92.1 94.9  PLT 228 242    Basic Metabolic Panel: Recent Labs  Lab 02/08/20 0116 02/09/20 0441  NA 135 139  K 4.2 4.7  CL 100 101  CO2 24 29  GLUCOSE 167* 142*  BUN 17 22  CREATININE 0.85 1.32*  CALCIUM 9.4 8.9  MG  --  2.0    GFR: Estimated Creatinine Clearance: 38.2 mL/min (A) (by C-G formula based on SCr of 1.32 mg/dL (H)).  Liver Function Tests: Recent Labs  Lab 02/08/20 0116 02/09/20 0441  AST 19 20  ALT 18 14  ALKPHOS 50 56  BILITOT 0.4 0.6  PROT 8.0 7.4  ALBUMIN 4.3 3.7    CBG: Recent Labs  Lab 02/08/20 2103 02/08/20 2352 02/09/20 0350 02/09/20 0733 02/09/20 1203  GLUCAP 133* 139* 140* 120* 103*     Recent Results (from the past 240 hour(s))  SARS CORONAVIRUS 2 (TAT 6-24 HRS) Nasopharyngeal Nasopharyngeal Swab     Status: None   Collection Time: 02/08/20  9:41 AM   Specimen: Nasopharyngeal Swab  Result Value Ref Range Status   SARS Coronavirus 2 NEGATIVE NEGATIVE Final    Comment: (NOTE) SARS-CoV-2 target nucleic acids are NOT DETECTED. The SARS-CoV-2 RNA is generally detectable in upper and lower respiratory specimens during the acute phase of infection. Negative results do not preclude SARS-CoV-2 infection, do not rule out co-infections with other pathogens, and should not be used as the sole basis for treatment or other patient management decisions. Negative results must be combined with clinical observations, patient history,  and epidemiological information. The expected result is Negative. Fact Sheet for Patients: SugarRoll.be Fact Sheet for Healthcare Providers: https://www.woods-mathews.com/ This test is not yet approved or cleared by the [place] FDA and  has been authorized for detection and/or diagnosis of SARS-CoV-2 by FDA under an Emergency Use Authorization (EUA). This EUA will remain  in effect (meaning this test can be used) for the duration of the COVID-19 declaration under Section 56 4(b)(1) of the Act, 21 U.S.C. section 360bbb-3(b)(1), unless the authorization is terminated or revoked sooner. Performed at [facility], [place] [address], [place], [place] [address]          Radiology Studies: CT Abdomen Pelvis W Contrast  Result Date: 02/08/2020 CLINICAL DATA:  Generalized lower abdominal pain EXAM: CT ABDOMEN AND PELVIS WITH CONTRAST TECHNIQUE: Multidetector CT imaging of the abdomen and pelvis was performed using the standard protocol following bolus administration of intravenous contrast.  CONTRAST:  161m OMNIPAQUE IOHEXOL 300 MG/ML  SOLN COMPARISON:  09/16/2013 FINDINGS: Lower chest:  No contributory findings. Hepatobiliary: Subtle surface lobulation of the liver with recanalized umbilical vein. Caudate lobe and fissure size is also prominent.No evidence of biliary obstruction or stone. Pancreas: Unremarkable. Spleen: Unremarkable. 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 No hydronephrosis or stone. Renal cortical lobulation on the both sides attributed to scarring. Unremarkable bladder. Stomach/Bowel: Small bowel is fluid-filled and distended with distal fecalization and mild mesenteric edema/interloop fluid. There is a relative transition at the level of the distal ileum where the small bowel is folded but no tight twist is noted. Stool is present in the proximal colon. No underlying bowel inflammation or masslike  enhancement. Vascular/Lymphatic: No acute vascular abnormality. Atherosclerotic calcification of the aorta and branch vessels with a severe stenosis of the proximal SMA. No mass or adenopathy. Reproductive:No pathologic findings. Other: No ascites or pneumoperitoneum.  Fatty midline hernia. Musculoskeletal: No acute abnormalities. IMPRESSION: 1. Findings of partial small bowel obstruction with underwhelming transition point at the terminal ileum. 2. Suspect cirrhosis, please correlate with risk factors. 3. Atherosclerosis with notably severe SMA stenosis. Electronically Signed   By: JMonte FantasiaM.D.   On: 02/08/2020 07:51   DG Abd Portable 1V  Result Date: 02/09/2020 CLINICAL DATA:  Small-bowel obstruction. EXAM: PORTABLE ABDOMEN - 1 VIEW COMPARISON:  Plain film of the abdomen dated 02/08/2020. FINDINGS: Contrast now in the RIGHT colon excluding a complete bowel obstruction. Dilated small bowel loops again seen indicating partial small bowel obstruction. No evidence of free intraperitoneal air seen. No evidence of abnormal fluid collection. Visualized osseous structures are unremarkable. IMPRESSION: 1. Contrast now in the RIGHT colon excluding a complete bowel obstruction. 2. Dilated small bowel loops again seen indicating partial small bowel obstruction. Electronically Signed   By: SFranki CabotM.D.   On: 02/09/2020 08:29   DG Abd Portable 1V-Small Bowel Obstruction Protocol-initial, 8 hr delay  Result Date: 02/08/2020 CLINICAL DATA:  Small bowel protocol, 8 hours postcontrast. EXAM: PORTABLE ABDOMEN - 1 VIEW COMPARISON:  CT earlier this day. FINDINGS: Enteric contrast opacifies the stomach and small bowel. There is no contrast in the colon. Excreted IV contrast in the urinary bladder from CT earlier today. IMPRESSION: Administered enteric contrast in the stomach and small bowel, not yet transiting to the colon. Recommend 24 hour delay radiograph. Electronically Signed   By: MKeith RakeM.D.   On:  02/08/2020 21:25        Scheduled Meds: . heparin  5,000 Units Subcutaneous Q8H  . insulin aspart  0-9 Units Subcutaneous Q4H   Continuous Infusions: . sodium chloride 100 mL/hr at 02/09/20 0358     LOS: 1 day        VHosie Poisson MD [facility]   To contact the attending provider between 7A-7P or the covering provider during after hours 7P-7A, please log into the web site [email or web address] and access using universal  password for that web site. If you do not have the password, please call the hospital operator.  02/09/2020, 12:14 PM

## 2020-02-09 NOTE — Progress Notes (Signed)
Initial Nutrition Assessment  DOCUMENTATION CODES:   Not applicable  INTERVENTION:  - diet advancement as medically feasible.    NUTRITION DIAGNOSIS:   Inadequate oral intake related to acute illness, inability to eat as evidenced by NPO status.  GOAL:   Patient will meet greater than or equal to 90% of their needs  MONITOR:   Diet advancement, Labs, Weight trends  REASON FOR ASSESSMENT:   Malnutrition Screening Tool  ASSESSMENT:   64 y.o. female with medical history of psoriasis on methotrexate, HTN, hyperlipidemia, and type 2 DM. She presented to the ED with abdominal pain x2 days with associated N/V. She tried Gas-X and Pepto-Bismol without relief.  Patient has been NPO since admission. No NGT in place at this time and patient is hoping she will not need to have one placed. She reports good appetite until 2-3 days PTA when severe abdominal pain began. She initially thought it was related to gas or needing to have a BM and tried medications for those things but did not have any relief and began having associated N/V. She has not had anything PO in 3 days at this point.  Per chart review, weight yesterday was 151 lb and weight on 09/17/19 was 158 lb. This indicates 7 lb weight loss (4.4% body weight) in the past 4.5 months; not significant for time frame.  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 Medications reviewed; sliding scale novolog.  IVF; NS @ 100 ml/hr.    NUTRITION - FOCUSED PHYSICAL EXAM:  completed; no muscle or fat wasting.   Diet Order:   Diet Order            Diet NPO time specified  Diet effective now              EDUCATION NEEDS:   Not appropriate for education at this time  Skin:  Skin Assessment: Reviewed RN Assessment  Last  BM:  4/25  Height:   Ht Readings from Last 1 Encounters:  02/08/20 5' (1.524 m)    Weight:   Wt Readings from Last 1 Encounters:  02/08/20 68.5 kg    Ideal Body Weight:  47.7 kg  BMI:  Body mass index is 29.49 kg/m.  Estimated Nutritional Needs:   Kcal:  1900-2100 kcal  Protein:  90-105 grams  Fluid:  >/= 2.2 L/day     Jarome Matin, MS, RD, LDN, CNSC Inpatient Clinical Dietitian RD pager # available in AMION  After hours/weekend pager # available in [facility]

## 2020-02-09 NOTE — Progress Notes (Signed)
Patient ID: Carrie [name], female   DOB: [birth year], 64 y.o.   MRN: [id number]       Subjective: Patient still doesn't feel well today.  Still requiring pain medication due to abdominal pain.  Had 2 episodes of vomiting bilious green emesis last night.  No flatus or BMs.  Repeat films show contrast in the right colon  ROS: See above, otherwise other systems negative  Objective: Vital signs in last 24 hours: Temp:  [97.4 F (36.3 C)-98.3 F (36.8 C)] 98 F (36.7 C) (04/26 2210) Pulse Rate:  [87-106] 98 (04/26 2225) Resp:  [16-17] 17 (04/26 2225) BP: (83-122)/(57-77) 102/70 (04/26 2225) SpO2:  [91 %-99 %] 91 % (04/26 2210) Last BM Date: 02/07/20  Intake/Output from previous day: 04/26 0701 - 04/27 0700 In: 1969.8 [P.O.:240; I.V.:1729.8] Out: 400 [Urine:400] Intake/Output this shift: No intake/output data recorded.  PE: Heart: regular Lungs: CTAB Abd: soft, but still quite tender diffusely, greatest on the right side, hypoactive BS  Lab Results:  Recent Labs    02/08/20 0116 02/09/20 0441  WBC 17.9* 10.6*  HGB 14.5 13.4  HCT 44.0 42.5  PLT 228 214   BMET Recent Labs    02/08/20 0116 02/09/20 0441  NA 135 139  K 4.2 4.7  CL 100 101  CO2 24 29  GLUCOSE 167* 142*  BUN 17 22  CREATININE 0.85 1.32*  CALCIUM 9.4 8.9   PT/INR No results for input(s): LABPROT, INR in the last 72 hours. CMP     Component Value Date/Time   NA 139 02/09/2020 0441   K 4.7 02/09/2020 0441   CL 101 02/09/2020 0441   CO2 29 02/09/2020 0441   GLUCOSE 142 (H) 02/09/2020 0441   BUN 22 02/09/2020 0441   CREATININE 1.32 (H) 02/09/2020 0441   CALCIUM 8.9 02/09/2020 0441   PROT 7.4 02/09/2020 0441   ALBUMIN 3.7 02/09/2020 0441   AST 20 02/09/2020 0441   ALT 14 02/09/2020 0441   ALKPHOS 56 02/09/2020 0441   BILITOT 0.6 02/09/2020 0441   GFRNONAA 43 (L) 02/09/2020 0441   GFRAA 50 (L) 02/09/2020 0441   Lipase     Component Value Date/Time   LIPASE 50 02/08/2020 0116        Studies/Results: CT Abdomen Pelvis W Contrast  Result Date: 02/08/2020 CLINICAL DATA:  Generalized lower abdominal pain EXAM: CT ABDOMEN AND PELVIS WITH CONTRAST TECHNIQUE: Multidetector CT imaging of the abdomen and pelvis was performed using the standard protocol following bolus administration of intravenous contrast. CONTRAST:  175m OMNIPAQUE IOHEXOL 300 MG/ML  SOLN COMPARISON:  09/16/2013 FINDINGS: Lower chest:  No contributory findings. Hepatobiliary: Subtle surface lobulation of the liver with recanalized umbilical vein. Caudate lobe and fissure size is also prominent.No evidence of biliary obstruction or stone. Pancreas: Unremarkable. Spleen: Unremarkable. 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 No hydronephrosis or stone. Renal cortical lobulation on the both sides attributed to scarring. Unremarkable bladder. Stomach/Bowel: Small bowel is fluid-filled and distended with distal fecalization and mild mesenteric edema/interloop fluid. There is a relative transition at the level of the distal ileum where the small bowel is folded but no tight twist is noted. Stool is present in the proximal colon. No underlying bowel inflammation or masslike enhancement. Vascular/Lymphatic: No acute vascular abnormality. Atherosclerotic calcification of the aorta and branch vessels with a severe stenosis of the proximal SMA. No mass or adenopathy. Reproductive:No pathologic findings. Other: No ascites or pneumoperitoneum.  Fatty midline hernia. Musculoskeletal: No acute abnormalities. IMPRESSION:  1. Findings of partial small bowel obstruction with underwhelming transition point at the terminal ileum. 2. Suspect cirrhosis, please correlate with risk factors. 3. Atherosclerosis with notably severe SMA stenosis. Electronically Signed   By: Monte Fantasia M.D.   On: 02/08/2020 07:51   DG Abd Portable 1V  Result Date: 02/09/2020 CLINICAL DATA:  Small-bowel obstruction. EXAM: PORTABLE  ABDOMEN - 1 VIEW COMPARISON:  Plain film of the abdomen dated 02/08/2020. FINDINGS: Contrast now in the RIGHT colon excluding a complete bowel obstruction. Dilated small bowel loops again seen indicating partial small bowel obstruction. No evidence of free intraperitoneal air seen. No evidence of abnormal fluid collection. Visualized osseous structures are unremarkable. IMPRESSION: 1. Contrast now in the RIGHT colon excluding a complete bowel obstruction. 2. Dilated small bowel loops again seen indicating partial small bowel obstruction. Electronically Signed   By: Franki Cabot M.D.   On: 02/09/2020 08:29   DG Abd Portable 1V-Small Bowel Obstruction Protocol-initial, 8 hr delay  Result Date: 02/08/2020 CLINICAL DATA:  Small bowel protocol, 8 hours postcontrast. EXAM: PORTABLE ABDOMEN - 1 VIEW COMPARISON:  CT earlier this day. FINDINGS: Enteric contrast opacifies the stomach and small bowel. There is no contrast in the colon. Excreted IV contrast in the urinary bladder from CT earlier today. IMPRESSION: Administered enteric contrast in the stomach and small bowel, not yet transiting to the colon. Recommend 24 hour delay radiograph. Electronically Signed   By: Keith Rake M.D.   On: 02/08/2020 21:25    Anti-infectives: Anti-infectives (From admission, onward)   None       Assessment/Plan PVD, H/O TIA - on daily ASA HTN HLD DM Gastroparesis  Abdominal pain, secondary to psbo vs SMA stenosis -repeat films today show contrast in right colon suggesting she is not completely obstructed -would like to avoid NGT, but if continues to vomit, may require.  Unclear how much of her N/V may be related to her gastroparesis vs acute problem -has more abdominal pain than what I expect from someone with an ileus or psbo.  Still concerned about her SMA stenosis, but according to the hospitalist, vascular reviewed her imaging and felt this was chronic and she had some collaterals even. -cont to closely  monitor -WBC has improved today though. -cont NPO for bowel rest given amount of tenderness diffusely patient still has as well as N/V.  FEN - NPO VTE - heparin ID - none currently needed, follow WBC   LOS: 1 day    [name] , [facility] 02/09/2020, 10:09 AM Please see Amion for pager number during day hours 7:00am-4:30pm or 7:00am -11:30am on weekends

## 2020-02-10 ENCOUNTER — Inpatient Hospital Stay (HOSPITAL_COMMUNITY): Payer: Medicare HMO

## 2020-02-10 LAB — BASIC METABOLIC PANEL
Anion gap: 12 (ref 5–15)
BUN: 22 mg/dL (ref 8–23)
CO2: 21 mmol/L — ABNORMAL LOW (ref 22–32)
Calcium: 7.9 mg/dL — ABNORMAL LOW (ref 8.9–10.3)
Chloride: 104 mmol/L (ref 98–111)
Creatinine, Ser: 1.11 mg/dL — ABNORMAL HIGH (ref 0.44–1.00)
GFR calc Af Amer: >60 mL/min (ref >60)
GFR calc non Af Amer: 53 mL/min — ABNORMAL LOW (ref >60)
Glucose, Bld: 95 mg/dL (ref 70–99)
Potassium: 4.3 mmol/L (ref 3.5–5.1)
Sodium: 137 mmol/L (ref 135–145)

## 2020-02-10 LAB — CBC
HCT: 42.4 % (ref 36.0–46.0)
Hemoglobin: 13.2 g/dL (ref 12.0–15.0)
MCH: 30.5 pg (ref 26.0–34.0)
MCHC: 31.1 g/dL (ref 30.0–36.0)
MCV: 97.9 fL (ref 80.0–100.0)
Platelets: 180 10*3/uL (ref 150–400)
RBC: 4.33 MIL/uL (ref 3.87–5.11)
RDW: 13.2 % (ref 11.5–15.5)
WBC: 6.3 10*3/uL (ref 4.0–10.5)
nRBC: 0 % (ref 0.0–0.2)

## 2020-02-10 LAB — GLUCOSE, CAPILLARY
Glucose-Capillary: 129 mg/dL — ABNORMAL HIGH (ref 70–99)
Glucose-Capillary: 70 mg/dL (ref 70–99)
Glucose-Capillary: 77 mg/dL (ref 70–99)
Glucose-Capillary: 80 mg/dL (ref 70–99)
Glucose-Capillary: 84 mg/dL (ref 70–99)
Glucose-Capillary: 95 mg/dL (ref 70–99)
Glucose-Capillary: 97 mg/dL (ref 70–99)

## 2020-02-10 NOTE — Progress Notes (Signed)
Patient ID: Carrie [name], female   DOB: [birth year], 64 y.o.   MRN: [id number]       Subjective: Feeling better today, but having some pain still.  Having some flatus and a large BM.  NGT output very clear now from ice  ROS: See above, otherwise other systems negative  Objective: Vital signs in last 24 hours: Temp:  [98.6 F (37 C)-99.7 F (37.6 C)] 98.6 F (37 C) (04/28 0322) Pulse Rate:  [89-120] 89 (04/28 0322) Resp:  [17-18] 17 (04/28 0322) BP: (102-128)/(67-76) 122/72 (04/28 0322) SpO2:  [89 %-100 %] 100 % (04/28 0322) Last BM Date: 02/09/20  Intake/Output from previous day: 04/27 0701 - 04/28 0700 In: 2120 [P.O.:120; I.V.:2000] Out: 1650 [Urine:200; Emesis/NG output:1450] Intake/Output this shift: No intake/output data recorded.  PE: Heart: regular Lungs: CTAB Abd: soft, still with some diffuse tenderness, but improved from yesterday, some BS, ND  Lab Results:  Recent Labs    02/09/20 0441 02/10/20 0616  WBC 10.6* 6.3  HGB 13.4 13.2  HCT 42.5 42.4  PLT 214 180   BMET Recent Labs    02/09/20 0441 02/10/20 0616  NA 139 137  K 4.7 4.3  CL 101 104  CO2 29 21*  GLUCOSE 142* 95  BUN 22 22  CREATININE 1.32* 1.11*  CALCIUM 8.9 7.9*   PT/INR No results for input(s): LABPROT, INR in the last 72 hours. CMP     Component Value Date/Time   NA 137 02/10/2020 0616   K 4.3 02/10/2020 0616   CL 104 02/10/2020 0616   CO2 21 (L) 02/10/2020 0616   GLUCOSE 95 02/10/2020 0616   BUN 22 02/10/2020 0616   CREATININE 1.11 (H) 02/10/2020 0616   CALCIUM 7.9 (L) 02/10/2020 0616   PROT 7.4 02/09/2020 0441   ALBUMIN 3.7 02/09/2020 0441   AST 20 02/09/2020 0441   ALT 14 02/09/2020 0441   ALKPHOS 56 02/09/2020 0441   BILITOT 0.6 02/09/2020 0441   GFRNONAA 53 (L) 02/10/2020 0616   GFRAA >60 02/10/2020 0616   Lipase     Component Value Date/Time   LIPASE 50 02/08/2020 0116       Studies/Results: DG Abd Portable 1V  Result Date: 02/10/2020 CLINICAL DATA:   Small bowel obstruction EXAM: PORTABLE ABDOMEN - 1 VIEW COMPARISON:  Yesterday FINDINGS: Enteric tube tip is at the distal stomach. Oral contrast has progressed from small bowel and colon. No small bowel dilatation is seen. IMPRESSION: Normalized bowel gas pattern with contrast seen throughout the nondistended colon. Electronically Signed   By: Monte Fantasia M.D.   On: 02/10/2020 07:02   DG Abd Portable 1V  Result Date: 02/09/2020 CLINICAL DATA:  NG placement EXAM: PORTABLE ABDOMEN - 1 VIEW COMPARISON:  Earlier today FINDINGS: NG tube is been placed with the tip in the gastric antrum. Decrease bowel gas compared with earlier today. No dilated bowel loops. Mild amount of stool in the colon. IMPRESSION: NG tube in the gastric antrum with decreased bowel gas compared with earlier today. Electronically Signed   By: Franchot Gallo M.D.   On: 02/09/2020 16:00   DG Abd Portable 1V  Result Date: 02/09/2020 CLINICAL DATA:  Small-bowel obstruction. EXAM: PORTABLE ABDOMEN - 1 VIEW COMPARISON:  Plain film of the abdomen dated 02/08/2020. FINDINGS: Contrast now in the RIGHT colon excluding a complete bowel obstruction. Dilated small bowel loops again seen indicating partial small bowel obstruction. No evidence of free intraperitoneal air seen. No evidence of abnormal fluid collection. Visualized osseous  structures are unremarkable. IMPRESSION: 1. Contrast now in the RIGHT colon excluding a complete bowel obstruction. 2. Dilated small bowel loops again seen indicating partial small bowel obstruction. Electronically Signed   By: Franki Cabot M.D.   On: 02/09/2020 08:29   DG Abd Portable 1V-Small Bowel Obstruction Protocol-initial, 8 hr delay  Result Date: 02/08/2020 CLINICAL DATA:  Small bowel protocol, 8 hours postcontrast. EXAM: PORTABLE ABDOMEN - 1 VIEW COMPARISON:  CT earlier this day. FINDINGS: Enteric contrast opacifies the stomach and small bowel. There is no contrast in the colon. Excreted IV contrast in the  urinary bladder from CT earlier today. IMPRESSION: Administered enteric contrast in the stomach and small bowel, not yet transiting to the colon. Recommend 24 hour delay radiograph. Electronically Signed   By: Keith Rake M.D.   On: 02/08/2020 21:25    Anti-infectives: Anti-infectives (From admission, onward)   None       Assessment/Plan PVD, H/O TIA - on daily ASA HTN HLD DM Gastroparesis  Abdominal pain, secondary to psbo vs SMA stenosis -repeat films today show contrast throughout the colon and with a normal bowel gas pattern -NGt output more clear this am than bilious in tubing.  NGT has coincidentally been clamped for the last couple hours with no nausea.  Will DC and give clear liquids to try -mobilize -pulm toilet -follow  FEN -CLD VTE -heparin ID -none currently needed   LOS: 2 days    [name] , [facility] 02/10/2020, 9:22 AM Please see Amion for pager number during day hours 7:00am-4:30pm or 7:00am -11:30am on weekends

## 2020-02-10 NOTE — Evaluation (Signed)
Physical Therapy One Time Evaluation Patient Details Name: Carrie [name] MRN: [id number] DOB: [birth year] Today's Date: 02/10/2020   History of Present Illness  "64 year old lady with prior history of psoriasis on methotrexate, essential hypertension, type 2 diabetes mellitus, hyperlipidemia, anxiety, asthma, gastroparesis presents to ED with periumbilical abdominal pain associated with some nausea and vomiting.  CT of the abdomen and pelvis showed findings of small bowel obstruction along with severe SMA stenosis.  General surgery was consulted for partial SBO.  Admitting hospitalist discussed with vascular surgery regarding the severe SMA stenosis, Dr. Carlis Abbott reviewed the CT results and suggest that this is a chronic finding with SMA calcifications.  Recommend outpatient follow-up with vascular surgery on discharge."  Clinical Impression  Patient evaluated by Physical Therapy with no further acute PT needs identified. All education has been completed and the patient has no further questions.  Pt mobilizing well and agreeable to ambulate during acute stay.  Pt encouraged to ambulate at least 4x/day.  Per pt, she is already up in room ad lib.  See below for any follow-up Physical Therapy or equipment needs. PT is signing off. Thank you for this referral.     Follow Up Recommendations No PT follow up    Equipment Recommendations  None recommended by PT    Recommendations for Other Services       Precautions / Restrictions Precautions Precautions: None      Mobility  Bed Mobility Overal bed mobility: Modified Independent                Transfers Overall transfer level: Modified independent                  Ambulation/Gait Ambulation/Gait assistance: Supervision;Modified independent (Device/Increase time) Gait Distance (Feet): 360 Feet Assistive device: None Gait Pattern/deviations: Step-through pattern;Decreased stride length        Stairs             Wheelchair Mobility    Modified Rankin (Stroke Patients Only)       Balance Overall balance assessment: No apparent balance deficits (not formally assessed)                                           Pertinent Vitals/Pain Pain Assessment: 0-10 Pain Score: 6  Pain Location: abdomen Pain Descriptors / Indicators: Grimacing;Sore Pain Intervention(s): Repositioned;Monitored during session    [name] expects to be discharged to:: Private residence Living Arrangements: Spouse/significant other   Type of Home: Mobile home       Home Layout: One Carrie [name]: Environmental consultant - 2 wheels;Cane - single point      Prior Function Level of Independence: Independent               Hand Dominance        Extremity/Trunk Assessment   Upper Extremity Assessment Upper Extremity Assessment: RUE deficits/detail RUE Deficits / Details: pt reports she was supposed to have rotator cuff surgery today (being seen by [facility])    Lower Extremity Assessment Lower Extremity Assessment: Overall WFL for tasks assessed       Communication   Communication: No difficulties  Cognition Arousal/Alertness: Awake/alert Behavior During Therapy: WFL for tasks assessed/performed Overall Cognitive Status: Within Functional Limits for tasks assessed  General Comments      Exercises     Assessment/Plan    PT Assessment Patent does not need any further PT services  PT Problem List         PT Treatment Interventions      PT Goals (Current goals can be found in the Care Plan section)  Acute Rehab PT Goals PT Goal Formulation: All assessment and education complete, DC therapy    Frequency     Barriers to discharge        Co-evaluation               AM-PAC PT "6 Clicks" Mobility  Outcome Measure Help needed turning from your back to your side while in a flat bed  without using bedrails?: A Little Help needed moving from lying on your back to sitting on the side of a flat bed without using bedrails?: A Little Help needed moving to and from a bed to a chair (including a wheelchair)?: None Help needed standing up from a chair using your arms (e.g., wheelchair or bedside chair)?: None Help needed to walk in hospital room?: None Help needed climbing 3-5 steps with a railing? : None 6 Click Score: 22    End of Session   Activity Tolerance: Patient tolerated treatment well Patient left: in chair;with call bell/phone within reach   PT Visit Diagnosis: Difficulty in walking, not elsewhere classified (R26.2)    Time: 7897-8478 PT Time Calculation (min) (ACUTE ONLY): 11 min   Charges:   PT Evaluation $PT Eval Low Complexity: 1 Low     Kati PT, DPT Acute Rehabilitation Services Office: [phone]  Trena Platt 02/10/2020, 3:07 PM

## 2020-02-10 NOTE — Progress Notes (Signed)
PROGRESS NOTE    Carrie [name]    Code Status: Full Code  KYH:[id number] DOB: Jul 15, 1956 DOA: 02/08/2020 LOS: 2 days  PCP: Carrie [name], Carrie Leyden, PA-C CC:  Chief Complaint  Patient presents with  . Abdominal Pain       Hospital Summary   This is a 64 year old female with history of psoriasis on methotrexate, hypertension, type 2 diabetes, hyperlipidemia, anxiety, asthma, gastroparesis who presented to the ED with periumbilical abdominal pain with nausea and vomiting.  CT of the abdomen pelvis showed SBO with severe SMA stenosis.  General surgery consulted for partial SBO.  Admitting hospitalist discussed findings with vascular surgery, Dr. Carlis [name], who reviewed CT results and suggested this was a chronic finding and recommended outpatient vascular surgery follow-up on discharge.  Patient has had NG tube placed on 4/27, despite refusing initially.    A & P   Principal Problem:   Bowel obstruction (HCC) Active Problems:   Hyperlipidemia   Depression   Diabetes mellitus type II, uncontrolled ([place])   HTN (hypertension)   Leukocytosis   Superior mesenteric artery stenosis ([place])   1. Partial SBO a. NGT placed 4/27 b. Still with some abdominal pain c. Repeat abdominal x-ray with normalized bowel gas pattern d. Per general surgery: Allowed clear liquids and encourage ambulation with reassessment in a.m.  Patient may require CT scanning +/- GI consult  2. 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 Vascular surgery follow-up   3. Type 2 diabetes a. HA1C 6 b. Continue sliding scale  4. Hypertension a. Continue metoprolol as needed  5. Asthma, not in acute exacerbation  6. AKI a. Baseline creatinine 0.8, currently 1.1 and improved b. Continue to monitor  DVT prophylaxis: Heparin Family Communication: Patient updated at bedside Disposition Plan:  Status is: Inpatient  Remains inpatient appropriate because:IV treatments appropriate due to intensity of illness or inability to  take PO   Dispo: The patient is from: Home              Anticipated d/c is to: Home              Anticipated d/c date is: 2 days              Patient currently is not medically stable to d/c.      Pressure injury documentation    None  Consultants  General surgery   Procedures  None  Antibiotics   Anti-infectives (From admission, onward)   None        Subjective   Examined lying flat in bed resting comfortably.  Still with diffuse abdominal pain.  Frustrated she still has NG tube.  No Overnight events no other complaints.  Objective   Vitals:   02/09/20 2300 02/10/20 0322 02/10/20 1019 02/10/20 1344  BP: 128/72 122/72 132/77 138/71  Pulse: 97 89 94 85  Resp:  17 16 16   Temp: 99.1 F (37.3 C) 98.6 F (37 C) 99.3 F (37.4 C) 97.6 F (36.4 C)  TempSrc: Oral Oral Oral Oral  SpO2:  100% 97% 96%  Weight:      Height:        Intake/Output Summary (Last 24 hours) at 02/10/2020 1600 Last data filed at 02/10/2020 1400 Gross per 24 hour  Intake 2665.67 ml  Output 2250 ml  Net 415.67 ml   Filed Weights   02/08/20 0125  Weight: 68.5 kg    Examination:  Physical Exam Vitals and nursing note reviewed.  Constitutional:      Appearance:  Normal appearance.  HENT:     Head: Normocephalic and atraumatic.  Eyes:     Conjunctiva/sclera: Conjunctivae normal.  Cardiovascular:     Rate and Rhythm: Normal rate and regular rhythm.  Pulmonary:     Effort: Pulmonary effort is normal.     Breath sounds: Normal breath sounds.  Abdominal:     General: Abdomen is flat.     Tenderness: There is generalized abdominal tenderness.  Musculoskeletal:        General: No swelling or tenderness.  Skin:    Coloration: Skin is not jaundiced or pale.  Neurological:     Mental Status: She is alert. Mental status is at baseline.  Psychiatric:        Mood and Affect: Mood normal.        Behavior: Behavior normal.     Data Reviewed: I have personally reviewed following labs  and imaging studies  CBC: Recent Labs  Lab 02/08/20 0116 02/09/20 0441 02/10/20 0616  WBC 17.9* 10.6* 6.3  HGB 14.5 13.4 13.2  HCT 44.0 42.5 42.4  MCV 92.1 94.9 97.9  PLT 228 214 280   Basic Metabolic Panel: Recent Labs  Lab 02/08/20 0116 02/09/20 0441 02/10/20 0616  NA 135 139 137  K 4.2 4.7 4.3  CL 100 101 104  CO2 24 29 21*  GLUCOSE 167* 142* 95  BUN 17 22 22   CREATININE 0.85 1.32* 1.11*  CALCIUM 9.4 8.9 7.9*  MG  --  2.0  --    GFR: Estimated Creatinine Clearance: 45.4 mL/min (A) (by C-G formula based on SCr of 1.11 mg/dL (H)). Liver Function Tests: Recent Labs  Lab 02/08/20 0116 02/09/20 0441  AST 19 20  ALT 18 14  ALKPHOS 50 56  BILITOT 0.4 0.6  PROT 8.0 7.4  ALBUMIN 4.3 3.7   Recent Labs  Lab 02/08/20 0116  LIPASE 50   No results for input(s): AMMONIA in the last 168 hours. Coagulation Profile: No results for input(s): INR, PROTIME in the last 168 hours. Cardiac Enzymes: No results for input(s): CKTOTAL, CKMB, CKMBINDEX, TROPONINI in the last 168 hours. BNP (last 3 results) No results for input(s): PROBNP in the last 8760 hours. HbA1C: Recent Labs    02/09/20 0441  HGBA1C 6.0*   CBG: Recent Labs  Lab 02/09/20 2003 02/10/20 0011 02/10/20 0324 02/10/20 0723 02/10/20 1116  GLUCAP 97 84 95 80 77   Lipid Profile: No results for input(s): CHOL, HDL, LDLCALC, TRIG, CHOLHDL, LDLDIRECT in the last 72 hours. Thyroid Function Tests: No results for input(s): TSH, T4TOTAL, FREET4, T3FREE, THYROIDAB in the last 72 hours. Anemia Panel: No results for input(s): VITAMINB12, FOLATE, FERRITIN, TIBC, IRON, RETICCTPCT in the last 72 hours. Sepsis Labs: No results for input(s): PROCALCITON, LATICACIDVEN in the last 168 hours.  Recent Results (from the past 240 hour(s))  SARS CORONAVIRUS 2 (TAT 6-24 HRS) Nasopharyngeal Nasopharyngeal Swab     Status: None   Collection Time: 02/08/20  9:41 AM   Specimen: Nasopharyngeal Swab  Result Value Ref Range  Status   SARS Coronavirus 2 NEGATIVE NEGATIVE Final    Comment: (NOTE) SARS-CoV-2 target nucleic acids are NOT DETECTED. The SARS-CoV-2 RNA is generally detectable in upper and lower respiratory specimens during the acute phase of infection. Negative results do not preclude SARS-CoV-2 infection, do not rule out co-infections with other pathogens, and should not be used as the sole basis for treatment or other patient management decisions. Negative results must be combined with clinical observations, patient  history, and epidemiological information. The expected result is Negative. Fact Sheet for Patients: SugarRoll.be Fact Sheet for Healthcare Providers: https://www.woods-mathews.com/ This test is not yet approved or cleared by the [place] FDA and  has been authorized for detection and/or diagnosis of SARS-CoV-2 by FDA under an Emergency Use Authorization (EUA). This EUA will remain  in effect (meaning this test can be used) for the duration of the COVID-19 declaration under Section 56 4(b)(1) of the Act, 21 U.S.C. section 360bbb-3(b)(1), unless the authorization is terminated or revoked sooner. Performed at [facility], [place] [address] [place], [place] [address]          Radiology Studies: DG Abd Portable 1V  Result Date: 02/10/2020 CLINICAL DATA:  Small bowel obstruction EXAM: PORTABLE ABDOMEN - 1 VIEW COMPARISON:  Yesterday FINDINGS: Enteric tube tip is at the distal stomach. Oral contrast has progressed from small bowel and colon. No small bowel dilatation is seen. IMPRESSION: Normalized bowel gas pattern with contrast seen throughout the nondistended colon. Electronically Signed   By: Monte Fantasia M.D.   On: 02/10/2020 07:02   DG Abd Portable 1V  Result Date: 02/09/2020 CLINICAL DATA:  NG placement EXAM: PORTABLE ABDOMEN - 1 VIEW COMPARISON:  Earlier today FINDINGS: NG tube is been placed with the tip in the gastric  antrum. Decrease bowel gas compared with earlier today. No dilated bowel loops. Mild amount of stool in the colon. IMPRESSION: NG tube in the gastric antrum with decreased bowel gas compared with earlier today. Electronically Signed   By: Franchot Gallo M.D.   On: 02/09/2020 16:00   DG Abd Portable 1V  Result Date: 02/09/2020 CLINICAL DATA:  Small-bowel obstruction. EXAM: PORTABLE ABDOMEN - 1 VIEW COMPARISON:  Plain film of the abdomen dated 02/08/2020. FINDINGS: Contrast now in the RIGHT colon excluding a complete bowel obstruction. Dilated small bowel loops again seen indicating partial small bowel obstruction. No evidence of free intraperitoneal air seen. No evidence of abnormal fluid collection. Visualized osseous structures are unremarkable. IMPRESSION: 1. Contrast now in the RIGHT colon excluding a complete bowel obstruction. 2. Dilated small bowel loops again seen indicating partial small bowel obstruction. Electronically Signed   By: Franki Cabot M.D.   On: 02/09/2020 08:29   DG Abd Portable 1V-Small Bowel Obstruction Protocol-initial, 8 hr delay  Result Date: 02/08/2020 CLINICAL DATA:  Small bowel protocol, 8 hours postcontrast. EXAM: PORTABLE ABDOMEN - 1 VIEW COMPARISON:  CT earlier this day. FINDINGS: Enteric contrast opacifies the stomach and small bowel. There is no contrast in the colon. Excreted IV contrast in the urinary bladder from CT earlier today. IMPRESSION: Administered enteric contrast in the stomach and small bowel, not yet transiting to the colon. Recommend 24 hour delay radiograph. Electronically Signed   By: Keith Rake M.D.   On: 02/08/2020 21:25        Scheduled Meds: . heparin  5,000 Units Subcutaneous Q8H  . hydrocortisone cream   Topical BID  . insulin aspart  0-9 Units Subcutaneous Q4H   Continuous Infusions: . sodium chloride 100 mL/hr at 02/10/20 1012     Time spent: 25 minutes with over 50% of the time coordinating the patient's care    Harold Hedge, DO [facility] Pager [phone]  Call night coverage person covering after 7pm

## 2020-02-10 NOTE — Plan of Care (Signed)
  Problem: Education: Goal: Knowledge of General Education information will improve Description: Including pain rating scale, medication(s)/side effects and non-pharmacologic comfort measures Outcome: Progressing   Problem: Health Behavior/Discharge Planning: Goal: Ability to manage health-related needs will improve Outcome: Progressing   Problem: Clinical Measurements: Goal: Ability to maintain clinical measurements within normal limits will improve Outcome: Progressing Goal: Will remain free from infection Outcome: Progressing Goal: Diagnostic test results will improve Outcome: Progressing Goal: Respiratory complications will improve Outcome: Progressing   Problem: Activity: Goal: Risk for activity intolerance will decrease Outcome: Progressing   Problem: Nutrition: Goal: Adequate nutrition will be maintained Outcome: Progressing   Problem: Coping: Goal: Level of anxiety will decrease Outcome: Progressing   Problem: Elimination: Goal: Will not experience complications related to bowel motility Outcome: Progressing Goal: Will not experience complications related to urinary retention Outcome: Progressing   Problem: Pain Managment: Goal: General experience of comfort will improve Outcome: Progressing

## 2020-02-11 ENCOUNTER — Inpatient Hospital Stay (HOSPITAL_COMMUNITY): Payer: Medicare HMO

## 2020-02-11 LAB — GLUCOSE, CAPILLARY
Glucose-Capillary: 119 mg/dL — ABNORMAL HIGH (ref 70–99)
Glucose-Capillary: 101 mg/dL — ABNORMAL HIGH (ref 70–99)
Glucose-Capillary: 121 mg/dL — ABNORMAL HIGH (ref 70–99)
Glucose-Capillary: 91 mg/dL (ref 70–99)
Glucose-Capillary: 141 mg/dL — ABNORMAL HIGH (ref 70–99)

## 2020-02-11 MED ORDER — IOHEXOL 9 MG/ML PO SOLN
500.0000 mL | ORAL | Status: AC
  Administered 2020-02-11 (×2): 500 mL via ORAL

## 2020-02-11 MED ORDER — IOHEXOL 9 MG/ML PO SOLN
ORAL | Status: AC
  Filled 2020-02-11: qty 1000

## 2020-02-11 MED ORDER — SODIUM CHLORIDE (PF) 0.9 % IJ SOLN
INTRAMUSCULAR | Status: AC
  Filled 2020-02-11: qty 50

## 2020-02-11 MED ORDER — IOHEXOL 300 MG/ML  SOLN
100.0000 mL | Freq: Once | INTRAMUSCULAR | Status: AC | PRN
  Administered 2020-02-11: 100 mL via INTRAVENOUS

## 2020-02-11 NOTE — Progress Notes (Signed)
PROGRESS NOTE    Carrie [name]    Code Status: Full Code  BHA:[id number] DOB: 1956/06/14 DOA: 02/08/2020 LOS: 3 days  PCP: Drosinis, Pamalee Leyden, PA-C CC:  Chief Complaint  Patient presents with  . Abdominal Pain       Hospital Summary   This is a 64 year old female with history of psoriasis on methotrexate, hypertension, type 2 diabetes, hyperlipidemia, anxiety, asthma, gastroparesis who presented to the ED with periumbilical abdominal pain with nausea and vomiting.  CT of the abdomen pelvis showed SBO with severe SMA stenosis.  General surgery consulted for partial SBO.  Admitting hospitalist discussed findings with vascular surgery, Dr. Carlis Abbott, who reviewed CT results and suggested this was a chronic finding and recommended outpatient vascular surgery follow-up on discharge.  Patient has had NG tube placed on 4/27, despite refusing initially.    A & P   Principal Problem:   Bowel obstruction (HCC) Active Problems:   Hyperlipidemia   Depression   Diabetes mellitus type II, uncontrolled ([place])   HTN (hypertension)   Leukocytosis   Superior mesenteric artery stenosis ([place])   1. Persistent abdominal pain, concern for SBO vs. Vascular vs. other a. NGT placed 4/27, currently out b. Abdominal pain described as sharp and positional  c. Per general surgery: repeat CT abd d. Previous CT abd showing severe SMA stenosis however Vascular surgery stated this was chronic and noted collateral and recommended outpatient follow up e. Depending on the results of the repeat CT scan, GI +/- Vascular surgery may need to be consulted  2. 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 Vascular surgery outpatient follow-up   3. Type 2 diabetes a. HA1C 6 b. Continue sliding scale  4. Hypertension a. Continue metoprolol as needed  5. Asthma, not in acute exacerbation  6. AKI a. Improved, but no labs today  DVT prophylaxis: Heparin Family Communication: Patient updated at bedside Disposition  Plan:  Status is: Inpatient  Remains inpatient appropriate because:IV treatments appropriate due to intensity of illness or inability to take PO   Dispo: The patient is from: Home              Anticipated d/c is to: Home              Anticipated d/c date is: 2 days              Patient currently is not medically stable to d/c.      Pressure injury documentation    None  Consultants  General surgery   Procedures  None  Antibiotics   Anti-infectives (From admission, onward)   None        Subjective   Still with abdominal discomfort noted as sharp at the level of the umbilicus and downward.  Most notable with change in position especially sitting upright.  No other issues. Objective   Vitals:   02/10/20 1019 02/10/20 1344 02/10/20 2111 02/11/20 1616  BP: 132/77 138/71 131/70 (!) 145/82  Pulse: 94 85 91 76  Resp: 16 16 18 20   Temp: 99.3 F (37.4 C) 97.6 F (36.4 C) 98.8 F (37.1 C) 98.3 F (36.8 C)  TempSrc: Oral Oral Oral   SpO2: 97% 96% 98% 98%  Weight:      Height:        Intake/Output Summary (Last 24 hours) at 02/11/2020 1632 Last data filed at 02/11/2020 1443 Gross per 24 hour  Intake 2879.97 ml  Output --  Net 2879.97 ml   [employer]  02/08/20 0125  Weight: 68.5 kg    Examination:  Physical Exam Vitals and nursing note reviewed.  Constitutional:      Appearance: Normal appearance.  HENT:     Head: Normocephalic and atraumatic.  Eyes:     Conjunctiva/sclera: Conjunctivae normal.  Cardiovascular:     Rate and Rhythm: Normal rate and regular rhythm.  Pulmonary:     Effort: Pulmonary effort is normal.     Breath sounds: Normal breath sounds.  Abdominal:     Comments: Abdominal discomfort noted in periumbilical region as well as LLQ and RLQ  Musculoskeletal:        General: No swelling or tenderness.  Skin:    Coloration: Skin is not jaundiced or pale.  Neurological:     Mental Status: She is alert. Mental status is at baseline.   Psychiatric:        Mood and Affect: Mood normal.        Behavior: Behavior normal.     Data Reviewed: I have personally reviewed following labs and imaging studies  CBC: Recent Labs  Lab 02/08/20 0116 02/09/20 0441 02/10/20 0616  WBC 17.9* 10.6* 6.3  HGB 14.5 13.4 13.2  HCT 44.0 42.5 42.4  MCV 92.1 94.9 97.9  PLT 228 214 076   Basic Metabolic Panel: Recent Labs  Lab 02/08/20 0116 02/09/20 0441 02/10/20 0616  NA 135 139 137  K 4.2 4.7 4.3  CL 100 101 104  CO2 24 29 21*  GLUCOSE 167* 142* 95  BUN 17 22 22   CREATININE 0.85 1.32* 1.11*  CALCIUM 9.4 8.9 7.9*  MG  --  2.0  --    GFR: Estimated Creatinine Clearance: 45.4 mL/min (A) (by C-G formula based on SCr of 1.11 mg/dL (H)). Liver Function Tests: Recent Labs  Lab 02/08/20 0116 02/09/20 0441  AST 19 20  ALT 18 14  ALKPHOS 50 56  BILITOT 0.4 0.6  PROT 8.0 7.4  ALBUMIN 4.3 3.7   Recent Labs  Lab 02/08/20 0116  LIPASE 50   No results for input(s): AMMONIA in the last 168 hours. Coagulation Profile: No results for input(s): INR, PROTIME in the last 168 hours. Cardiac Enzymes: No results for input(s): CKTOTAL, CKMB, CKMBINDEX, TROPONINI in the last 168 hours. BNP (last 3 results) No results for input(s): PROBNP in the last 8760 hours. HbA1C: Recent Labs    02/09/20 0441  HGBA1C 6.0*   CBG: Recent Labs  Lab 02/10/20 2334 02/11/20 0400 02/11/20 0749 02/11/20 1137 02/11/20 1610  GLUCAP 97 119* 101* 121* 91   Lipid Profile: No results for input(s): CHOL, HDL, LDLCALC, TRIG, CHOLHDL, LDLDIRECT in the last 72 hours. Thyroid Function Tests: No results for input(s): TSH, T4TOTAL, FREET4, T3FREE, THYROIDAB in the last 72 hours. Anemia Panel: No results for input(s): VITAMINB12, FOLATE, FERRITIN, TIBC, IRON, RETICCTPCT in the last 72 hours. Sepsis Labs: No results for input(s): PROCALCITON, LATICACIDVEN in the last 168 hours.  Recent Results (from the past 240 hour(s))  SARS CORONAVIRUS 2 (TAT  6-24 HRS) Nasopharyngeal Nasopharyngeal Swab     Status: None   Collection Time: 02/08/20  9:41 AM   Specimen: Nasopharyngeal Swab  Result Value Ref Range Status   SARS Coronavirus 2 NEGATIVE NEGATIVE Final    Comment: (NOTE) SARS-CoV-2 target nucleic acids are NOT DETECTED. The SARS-CoV-2 RNA is generally detectable in upper and lower respiratory specimens during the acute phase of infection. Negative results do not preclude SARS-CoV-2 infection, do not rule out co-infections with other pathogens,  and should not be used as the sole basis for treatment or other patient management decisions. Negative results must be combined with clinical observations, patient history, and epidemiological information. The expected result is Negative. Fact Sheet for Patients: SugarRoll.be Fact Sheet for Healthcare Providers: https://www.woods-mathews.com/ This test is not yet approved or cleared by the [place] FDA and  has been authorized for detection and/or diagnosis of SARS-CoV-2 by FDA under an Emergency Use Authorization (EUA). This EUA will remain  in effect (meaning this test can be used) for the duration of the COVID-19 declaration under Section 56 4(b)(1) of the Act, 21 U.S.C. section 360bbb-3(b)(1), unless the authorization is terminated or revoked sooner. Performed at [facility], [place] [address], [place], [place] [address]          Radiology Studies: DG Abd Portable 1V  Result Date: 02/10/2020 CLINICAL DATA:  Small bowel obstruction EXAM: PORTABLE ABDOMEN - 1 VIEW COMPARISON:  Yesterday FINDINGS: Enteric tube tip is at the distal stomach. Oral contrast has progressed from small bowel and colon. No small bowel dilatation is seen. IMPRESSION: Normalized bowel gas pattern with contrast seen throughout the nondistended colon. Electronically Signed   By: Monte Fantasia M.D.   On: 02/10/2020 07:02        Scheduled Meds: . heparin   5,000 Units Subcutaneous Q8H  . hydrocortisone cream   Topical BID  . insulin aspart  0-9 Units Subcutaneous Q4H  . iohexol      . sodium chloride (PF)       Continuous Infusions: . sodium chloride 100 mL/hr at 02/10/20 2136     Time spent: 30 minutes with over 50% of the time coordinating the patient's care    Harold Hedge, DO [facility] Pager [phone]  Call night coverage person covering after 7pm

## 2020-02-11 NOTE — Care Management Important Message (Signed)
Important Message  Patient Details IM Letter given to [facility] Case Manager to present to the Patient Name: Carrie [name] MRN: [id number] Date of Birth: 1956-02-17   Medicare Important Message Given:  Yes     Kerin Salen 02/11/2020, 12:26 PM

## 2020-02-11 NOTE — Progress Notes (Signed)
Patient ID: Carrie [name], female   DOB: 1956/06/19, 64 y.o.   MRN: [id number]       Subjective: Still with diffuse abdominal pain.  Rates a 7 currently.  Had some liquids, may have made her pain slightly worse this am.  No nausea since small bout yesterday.  No further BM.  ROS: See above, otherwise other systems negative  Objective: Vital signs in last 24 hours: Temp:  [97.6 F (36.4 C)-99.3 F (37.4 C)] 98.8 F (37.1 C) (04/28 2111) Pulse Rate:  [85-94] 91 (04/28 2111) Resp:  [16-18] 18 (04/28 2111) BP: (131-138)/(70-77) 131/70 (04/28 2111) SpO2:  [96 %-98 %] 98 % (04/28 2111) Last BM Date: 02/10/20  Intake/Output from previous day: 04/28 0701 - 04/29 0700 In: 3396 [P.O.:720; I.V.:2676] Out: 800 [Urine:500; Emesis/NG output:300] Intake/Output this shift: No intake/output data recorded.  PE: Heart: regular Lungs: CTAB Abd: soft, still with diffuse tenderness, but no guarding or peritonitis, +BS, ND  Lab Results:  Recent Labs    02/09/20 0441 02/10/20 0616  WBC 10.6* 6.3  HGB 13.4 13.2  HCT 42.5 42.4  PLT 214 180   BMET Recent Labs    02/09/20 0441 02/10/20 0616  NA 139 137  K 4.7 4.3  CL 101 104  CO2 29 21*  GLUCOSE 142* 95  BUN 22 22  CREATININE 1.32* 1.11*  CALCIUM 8.9 7.9*   PT/INR No results for input(s): LABPROT, INR in the last 72 hours. CMP     Component Value Date/Time   NA 137 02/10/2020 0616   K 4.3 02/10/2020 0616   CL 104 02/10/2020 0616   CO2 21 (L) 02/10/2020 0616   GLUCOSE 95 02/10/2020 0616   BUN 22 02/10/2020 0616   CREATININE 1.11 (H) 02/10/2020 0616   CALCIUM 7.9 (L) 02/10/2020 0616   PROT 7.4 02/09/2020 0441   ALBUMIN 3.7 02/09/2020 0441   AST 20 02/09/2020 0441   ALT 14 02/09/2020 0441   ALKPHOS 56 02/09/2020 0441   BILITOT 0.6 02/09/2020 0441   GFRNONAA 53 (L) 02/10/2020 0616   GFRAA >60 02/10/2020 0616   Lipase     Component Value Date/Time   LIPASE 50 02/08/2020 0116       Studies/Results: DG Abd  Portable 1V  Result Date: 02/10/2020 CLINICAL DATA:  Small bowel obstruction EXAM: PORTABLE ABDOMEN - 1 VIEW COMPARISON:  Yesterday FINDINGS: Enteric tube tip is at the distal stomach. Oral contrast has progressed from small bowel and colon. No small bowel dilatation is seen. IMPRESSION: Normalized bowel gas pattern with contrast seen throughout the nondistended colon. Electronically Signed   By: Monte Fantasia M.D.   On: 02/10/2020 07:02   DG Abd Portable 1V  Result Date: 02/09/2020 CLINICAL DATA:  NG placement EXAM: PORTABLE ABDOMEN - 1 VIEW COMPARISON:  Earlier today FINDINGS: NG tube is been placed with the tip in the gastric antrum. Decrease bowel gas compared with earlier today. No dilated bowel loops. Mild amount of stool in the colon. IMPRESSION: NG tube in the gastric antrum with decreased bowel gas compared with earlier today. Electronically Signed   By: Franchot Gallo M.D.   On: 02/09/2020 16:00    Anti-infectives: Anti-infectives (From admission, onward)   None       Assessment/Plan PVD, H/O TIA - on daily ASA HTN HLD DM Gastroparesis  Abdominal pain, unclear etiology -repeat films today show contrast throughout the colon and with a normal bowel gas pattern -CLD -repeat CT scan today to reassess for further reasoning  behind persistent pain. -mobilize -pulm toilet -follow  FEN -CLD VTE -heparin ID -none currently needed  LOS: 3 days    [name] , [facility] 02/11/2020, 9:24 AM Please see Amion for pager number during day hours 7:00am-4:30pm or 7:00am -11:30am on weekends

## 2020-02-12 LAB — GLUCOSE, CAPILLARY
Glucose-Capillary: 118 mg/dL — ABNORMAL HIGH (ref 70–99)
Glucose-Capillary: 121 mg/dL — ABNORMAL HIGH (ref 70–99)
Glucose-Capillary: 98 mg/dL (ref 70–99)
Glucose-Capillary: 99 mg/dL (ref 70–99)

## 2020-02-12 LAB — BASIC METABOLIC PANEL
Anion gap: 7 (ref 5–15)
BUN: 5 mg/dL — ABNORMAL LOW (ref 8–23)
CO2: 24 mmol/L (ref 22–32)
Calcium: 8.4 mg/dL — ABNORMAL LOW (ref 8.9–10.3)
Chloride: 111 mmol/L (ref 98–111)
Creatinine, Ser: 0.72 mg/dL (ref 0.44–1.00)
GFR calc Af Amer: >60 mL/min (ref >60)
GFR calc non Af Amer: >60 mL/min (ref >60)
Glucose, Bld: 123 mg/dL — ABNORMAL HIGH (ref 70–99)
Potassium: 4.3 mmol/L (ref 3.5–5.1)
Sodium: 142 mmol/L (ref 135–145)

## 2020-02-12 LAB — CBC
HCT: 36.5 % (ref 36.0–46.0)
Hemoglobin: 11.6 g/dL — ABNORMAL LOW (ref 12.0–15.0)
MCH: 29.9 pg (ref 26.0–34.0)
MCHC: 31.8 g/dL (ref 30.0–36.0)
MCV: 94.1 fL (ref 80.0–100.0)
Platelets: 151 10*3/uL (ref 150–400)
RBC: 3.88 MIL/uL (ref 3.87–5.11)
RDW: 12.9 % (ref 11.5–15.5)
WBC: 3.6 10*3/uL — ABNORMAL LOW (ref 4.0–10.5)
nRBC: 0 % (ref 0.0–0.2)

## 2020-02-12 NOTE — Discharge Summary (Signed)
Physician Discharge Summary  Carrie [name] PVX:[id number] DOB: May 22, 1956   PCP: Mina Marble, PA-C  Admit date: 02/08/2020 Discharge date: 02/12/2020 Length of Stay: 4 days   Code Status: Full Code  Admitted From:  Home Discharged to:   [place]:  None  Equipment/Devices:  None Discharge Condition:  Stable  Recommendations for Outpatient Follow-up   1. Follow up with PCP  2. Follow-up with vascular surgery outpatient  Hospital Summary  This is a 64 year old female with history of psoriasis on methotrexate, hypertension, type 2 diabetes, hyperlipidemia, anxiety, asthma, gastroparesis who presented to the ED with periumbilical abdominal pain with nausea and vomiting.  CT of the abdomen pelvis showed SBO with severe SMA stenosis.  General surgery consulted for partial SBO.  Admitting hospitalist discussed findings with vascular surgery, Dr. Carlis Abbott, who reviewed CT results and suggested this was a chronic finding and recommended outpatient vascular surgery follow-up on discharge.  Patient has had NG tube placed on 4/27, despite refusing initially which was removed the following day.  Unfortunately, patient continued to have moderate to severe abdominal pain noted from umbilicus down.  On 4/29 repeat CT abdomen/pelvis with contrast was ordered which actually showed overall improved findings.  On 4/30 she noted that she had resolution of her symptoms and patient's diet was advanced to soft and she tolerated p.o. intake without any adverse effect.  She was discharged in stable condition on 4/30 without any outpatient follow-up arranged.  A & P   Principal Problem:   Bowel obstruction (HCC) Active Problems:   Hyperlipidemia   Depression   Diabetes mellitus type II, uncontrolled ([place])   HTN (hypertension)   Leukocytosis   Superior mesenteric artery stenosis ([place])    1. Persistent abdominal pain, unclear etiology concern for SBO (#1) vs. Vascular vs. other resolved and  tolerating p.o. intake a. EGD/colonoscopy 1 year ago with grade a esophagitis and benign polyps in the colon with diverticulosis b. Consider GI referral if pain recurs c. No general surgery follow-up needed  2. 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 Vascular surgery outpatient follow-up   3. Type 2 diabetes a. HA1C 6  4. Hypertension a. Continue metoprolol as needed  5. Asthma, not in acute exacerbation  6. AKI resolved     Consultants  . No surgery  Procedures  . None  Antibiotics   Anti-infectives (From admission, onward)   None       Subjective  Patient seen and examined at bedside no acute distress and resting comfortably.  No events overnight.  Tolerating soft diet. In good spirits and anticipating discharge.  Reports symptoms have resolved  Denies any chest pain, shortness of breath, fever, nausea, vomiting, bowel complaints. Otherwise ROS negative    Objective   Discharge Exam: Vitals:   02/11/20 2142 02/12/20 0454  BP: (!) 153/84 (!) 156/82  Pulse: 82 75  Resp: 16 18  Temp: 99 F (37.2 C) 98.2 F (36.8 C)  SpO2: 96% 97%   Vitals:   02/10/20 2111 02/11/20 1616 02/11/20 2142 02/12/20 0454  BP: 131/70 (!) 145/82 (!) 153/84 (!) 156/82  Pulse: 91 76 82 75  Resp: _0 Temp: 98.8 F (37.1 C) 98.3 F (36.8 C) 99 F (37.2 C) 98.2 F (36.8 C)  TempSrc: Oral Oral Oral Oral  SpO2: 98% 98% 96% 97%  Weight:      Height:        Physical Exam Vitals and nursing note reviewed.  Constitutional:  Appearance: Normal appearance.  HENT:     Head: Normocephalic and atraumatic.  Eyes:     Conjunctiva/sclera: Conjunctivae normal.  Cardiovascular:     Rate and Rhythm: Normal rate and regular rhythm.  Pulmonary:     Effort: Pulmonary effort is normal.     Breath sounds: Normal breath sounds.  Abdominal:     General: Abdomen is flat.     Palpations: Abdomen is soft.     Tenderness: There is no abdominal tenderness.   Musculoskeletal:        General: No swelling or tenderness.  Skin:    Coloration: Skin is not jaundiced or pale.  Neurological:     Mental Status: She is alert. Mental status is at baseline.  Psychiatric:        Mood and Affect: Mood normal.        Behavior: Behavior normal.       The results of significant diagnostics from this hospitalization (including imaging, microbiology, ancillary and laboratory) are listed below for reference.     Microbiology: Recent Results (from the past 240 hour(s))  SARS CORONAVIRUS 2 (TAT 6-24 HRS) Nasopharyngeal Nasopharyngeal Swab     Status: None   Collection Time: 02/08/20  9:41 AM   Specimen: Nasopharyngeal Swab  Result Value Ref Range Status   SARS Coronavirus 2 NEGATIVE NEGATIVE Final    Comment: (NOTE) SARS-CoV-2 target nucleic acids are NOT DETECTED. The SARS-CoV-2 RNA is generally detectable in upper and lower respiratory specimens during the acute phase of infection. Negative results do not preclude SARS-CoV-2 infection, do not rule out co-infections with other pathogens, and should not be used as the sole basis for treatment or other patient management decisions. Negative results must be combined with clinical observations, patient history, and epidemiological information. The expected result is Negative. Fact Sheet for Patients: SugarRoll.be Fact Sheet for Healthcare Providers: https://www.woods-mathews.com/ This test is not yet approved or cleared by the [place] FDA and  has been authorized for detection and/or diagnosis of SARS-CoV-2 by FDA under an Emergency Use Authorization (EUA). This EUA will remain  in effect (meaning this test can be used) for the duration of the COVID-19 declaration under Section 56 4(b)(1) of the Act, 21 U.S.C. section 360bbb-3(b)(1), unless the authorization is terminated or revoked sooner. Performed at [facility], [place] [address],  [place], [place] [address]      Labs: BNP (last 3 results) No results for input(s): BNP in the last 8760 hours. Basic Metabolic Panel: Recent Labs  Lab 02/08/20 0116 02/09/20 0441 02/10/20 0616 02/12/20 0421  NA 135 139 137 142  K 4.2 4.7 4.3 4.3  CL 100 101 104 111  CO2 24 29 21* 24  GLUCOSE 167* 142* 95 123*  BUN _0 5*  CREATININE 0.85 1.32* 1.11* 0.72  CALCIUM 9.4 8.9 7.9* 8.4*  MG  --  2.0  --   --    Liver Function Tests: Recent Labs  Lab 02/08/20 0116 02/09/20 0441  AST 19 20  ALT 18 14  ALKPHOS 50 56  BILITOT 0.4 0.6  PROT 8.0 7.4  ALBUMIN 4.3 3.7   Recent Labs  Lab 02/08/20 0116  LIPASE 50   No results for input(s): AMMONIA in the last 168 hours. CBC: Recent Labs  Lab 02/08/20 0116 02/09/20 0441 02/10/20 0616 02/12/20 0421  WBC 17.9* 10.6* 6.3 3.6*  HGB 14.5 13.4 13.2 11.6*  HCT 44.0 42.5 42.4 36.5  MCV 92.1 94.9 97.9 94.1  PLT  228 214 180 151   Cardiac Enzymes: No results for input(s): CKTOTAL, CKMB, CKMBINDEX, TROPONINI in the last 168 hours. BNP: Invalid input(s): POCBNP CBG: Recent Labs  Lab 02/11/20 2003 02/11/20 2345 02/12/20 0347 02/12/20 0721 02/12/20 1217  GLUCAP 141* 99 98 118* 121*   D-Dimer No results for input(s): DDIMER in the last 72 hours. Hgb A1c No results for input(s): HGBA1C in the last 72 hours. Lipid Profile No results for input(s): CHOL, HDL, LDLCALC, TRIG, CHOLHDL, LDLDIRECT in the last 72 hours. Thyroid function studies No results for input(s): TSH, T4TOTAL, T3FREE, THYROIDAB in the last 72 hours.  Invalid input(s): FREET3 Anemia work up No results for input(s): VITAMINB12, FOLATE, FERRITIN, TIBC, IRON, RETICCTPCT in the last 72 hours. Urinalysis    Component Value Date/Time   COLORURINE YELLOW 02/08/2020 0620   APPEARANCEUR CLEAR 02/08/2020 0620   LABSPEC 1.017 02/08/2020 0620   PHURINE 5.0 02/08/2020 0620   GLUCOSEU NEGATIVE 02/08/2020 0620   HGBUR NEGATIVE 02/08/2020 0620   BILIRUBINUR  NEGATIVE 02/08/2020 0620   KETONESUR NEGATIVE 02/08/2020 0620   PROTEINUR NEGATIVE 02/08/2020 0620   UROBILINOGEN 0.2 06/25/2015 1600   NITRITE NEGATIVE 02/08/2020 0620   LEUKOCYTESUR TRACE (A) 02/08/2020 0620   Sepsis Labs Invalid input(s): PROCALCITONIN,  WBC,  LACTICIDVEN Microbiology Recent Results (from the past 240 hour(s))  SARS CORONAVIRUS 2 (TAT 6-24 HRS) Nasopharyngeal Nasopharyngeal Swab     Status: None   Collection Time: 02/08/20  9:41 AM   Specimen: Nasopharyngeal Swab  Result Value Ref Range Status   SARS Coronavirus 2 NEGATIVE NEGATIVE Final    Comment: (NOTE) SARS-CoV-2 target nucleic acids are NOT DETECTED. The SARS-CoV-2 RNA is generally detectable in upper and lower respiratory specimens during the acute phase of infection. Negative results do not preclude SARS-CoV-2 infection, do not rule out co-infections with other pathogens, and should not be used as the sole basis for treatment or other patient management decisions. Negative results must be combined with clinical observations, patient history, and epidemiological information. The expected result is Negative. Fact Sheet for Patients: SugarRoll.be Fact Sheet for Healthcare Providers: https://www.woods-mathews.com/ This test is not yet approved or cleared by the [place] FDA and  has been authorized for detection and/or diagnosis of SARS-CoV-2 by FDA under an Emergency Use Authorization (EUA). This EUA will remain  in effect (meaning this test can be used) for the duration of the COVID-19 declaration under Section 56 4(b)(1) of the Act, 21 U.S.C. section 360bbb-3(b)(1), unless the authorization is terminated or revoked sooner. Performed at [facility], [place] [address], [place], [place] [address]     Discharge Instructions     Discharge Instructions    DIET SOFT   Complete by: As directed    Discharge instructions   Complete by: As directed     You were seen and examined in the hospital for abdominal pain and cared for by a hospitalist and surgeon  Upon Discharge:  - try to stick to a soft diet for the next week  - continue your home medications  Request that your primary physician go over all hospital tests and procedures/radiological results at the follow up.   Please get all hospital records sent to your physician by signing a hospital release before you go home.   Read the complete instructions along with all the possible side effects for all the medicines you take and that have been prescribed to you. Take any new medicines after you have completely understood and accept all the  possible adverse reactions/side effects.   If you have any questions about your discharge medications or the care you received while you were in the hospital, you can call the unit and asked to speak with the hospitalist on call. Once you are discharged, your primary care physician will handle any further medical issues. Please note that NO REFILLS for any discharge medications will be authorized, as it is imperative that you return to your primary care physician (or establish a relationship with a primary care physician if you do not have one) for your aftercare needs so that they can reassess your need for medications and monitor your lab values.   Do not drive, operate heavy machinery, perform activities at heights, swimming or participation in water activities or provide baby sitting services if your were admitted for loss of consciousness/seizures or if you are on sedating medications including, but not limited to benzodiazepines, sleep medications, narcotic pain medications, etc., until you have been cleared to do so by a medical doctor.   Do not take more than prescribed medications.   Wear a seat belt while driving.  If you have smoked or chewed Tobacco in the last 2 years please stop smoking; also stop any regular Alcohol and/or any Recreational  drug use including marijuana.  If you experience worsening of your admission symptoms or develop shortness of breath, chest pain, suicidal or homicidal thoughts or experience a life threatening emergency, you must seek medical attention immediately by calling 911 or calling your PCP immediately.   Increase activity slowly   Complete by: As directed      Allergies as of 02/12/2020      Reactions   Lisinopril Cough      Medication List    STOP taking these medications   doxycycline 100 MG tablet Commonly known as: VIBRA-TABS   lovastatin 20 MG tablet Commonly known as: MEVACOR     TAKE these medications   Accu-Chek Aviva Plus w/Device Kit 1 each by Does not apply route 2 (two) times daily. E11.9   Accu-Chek Aviva Soln 1 each by Other route as needed (Use prn to calibrate glucometer). E11.9   Accu-Chek Softclix Lancets lancets 1 each by Other route 2 (two) times daily. E11.9   acetaminophen 325 MG tablet Commonly known as: TYLENOL Take 325-650 mg by mouth every 6 (six) hours as needed for mild pain or headache.   amLODipine 5 MG tablet Commonly known as: NORVASC Take 1 tablet (5 mg total) by mouth daily.   aspirin EC 81 MG tablet Take 81 mg by mouth every morning.   B-D SINGLE USE SWABS REGULAR Pads 1 each by Does not apply route 2 (two) times daily. Use to prep site prior to obtaining droplet of blood; E11.9   benzonatate 200 MG capsule Commonly known as: TESSALON Take 200 mg by mouth 3 (three) times daily as needed for cough.   busPIRone 5 MG tablet Commonly known as: BUSPAR Take 5 mg by mouth at bedtime.   Calcium Carbonate-Vitamin D 600-400 MG-UNIT tablet Take 1 tablet by mouth daily.   diclofenac Sodium 1 % Gel Commonly known as: VOLTAREN Apply 4 g topically 4 (four) times daily as needed for muscle pain.   diltiazem 180 MG 24 hr capsule Commonly known as: CARDIZEM CD Take 180 mg by mouth daily.   esomeprazole 20 MG capsule Commonly known as:  NEXIUM Take 20 mg by mouth every morning.   folic acid 1 MG tablet Commonly known as: FOLVITE Take 1  mg by mouth at bedtime.   gabapentin 300 MG capsule Commonly known as: NEURONTIN Take 300 mg by mouth at bedtime.   glucose blood test strip Commonly known as: Accu-Chek Aviva 1 each by Other route 2 (two) times daily. E11.9   Humira Pen 40 MG/0.4ML Pnkt Generic drug: Adalimumab Inject 0.8 mLs into the skin every Wednesday.   memantine 10 MG tablet Commonly known as: NAMENDA Take 10 mg by mouth at bedtime.   metFORMIN 1000 MG tablet Commonly known as: GLUCOPHAGE Take 1 tablet (1,000 mg total) by mouth 2 (two) times daily with a meal.   methocarbamol 500 MG tablet Commonly known as: ROBAXIN Take 500 mg by mouth at bedtime.   methotrexate 50 MG/2ML injection Inject 20 mg into the skin once a week.   nystatin cream Commonly known as: MYCOSTATIN Apply 1 application topically 2 (two) times daily as needed for dry skin (yeast infection on breast).   olmesartan 40 MG tablet Commonly known as: BENICAR Take 40 mg by mouth daily.   Ozempic (1 MG/DOSE) 2 MG/1.5ML Sopn Generic drug: Semaglutide (1 MG/DOSE) Inject 1 mg into the skin once a week.   PEG-KCl-NaCl-NaSulf-Na Asc-C 140 g Solr Commonly known as: Plenvu Take 140 g by mouth as directed.   pravastatin 20 MG tablet Commonly known as: PRAVACHOL Take 20 mg by mouth daily.   primidone 250 MG tablet Commonly known as: MYSOLINE Take 250 mg by mouth in the morning and at bedtime.   ProAir HFA 108 (90 Base) MCG/ACT inhaler Generic drug: albuterol Inhale 2 puffs into the lungs every 4 (four) hours as needed for wheezing or shortness of breath.   promethazine-dextromethorphan 6.25-15 MG/5ML syrup Commonly known as: PROMETHAZINE-DM Take 5 mLs by mouth 4 (four) times daily as needed for cough.   sertraline 50 MG tablet Commonly known as: ZOLOFT Take 75 mg by mouth at bedtime.       Allergies  Allergen Reactions   . Lisinopril Cough    Dispo: The patient is from: Home              Anticipated d/c is to: Home              Anticipated d/c date is:Today              Patient currently is medically stable to d/c.        Time coordinating discharge: Over 30 minutes   SIGNED:   Harold Hedge, D.O. [facility] Pager: [phone]  02/12/2020, 1:51 PM

## 2020-02-12 NOTE — Progress Notes (Signed)
Patient ID: Carrie [name], female   DOB: [birth year], 64 y.o.   MRN: [id number]       Subjective: Feels much better today.  Pain is significantly better.  Tolerating CLD and asking when she might be able to go home.  ROS: See above, otherwise other systems negative  Objective: Vital signs in last 24 hours: Temp:  [98.2 F (36.8 C)-99 F (37.2 C)] 98.2 F (36.8 C) (04/30 0454) Pulse Rate:  [75-82] 75 (04/30 0454) Resp:  [16-20] 18 (04/30 0454) BP: (145-156)/(82-84) 156/82 (04/30 0454) SpO2:  [96 %-98 %] 97 % (04/30 0454) Last BM Date: 02/11/20  Intake/Output from previous day: 04/29 0701 - 04/30 0700 In: 3147 [P.O.:1074; I.V.:2073] Out: 0  Intake/Output this shift: No intake/output data recorded.  PE: Heart: regular Lungs: CTAB Abd: soft, significantly less tender today, +Bs, ND  Lab Results:  Recent Labs    02/10/20 0616 02/12/20 0421  WBC 6.3 3.6*  HGB 13.2 11.6*  HCT 42.4 36.5  PLT 180 151   BMET Recent Labs    02/10/20 0616 02/12/20 0421  NA 137 142  K 4.3 4.3  CL 104 111  CO2 21* 24  GLUCOSE 95 123*  BUN 22 5*  CREATININE 1.11* 0.72  CALCIUM 7.9* 8.4*   PT/INR No results for input(s): LABPROT, INR in the last 72 hours. CMP     Component Value Date/Time   NA 142 02/12/2020 0421   K 4.3 02/12/2020 0421   CL 111 02/12/2020 0421   CO2 24 02/12/2020 0421   GLUCOSE 123 (H) 02/12/2020 0421   BUN 5 (L) 02/12/2020 0421   CREATININE 0.72 02/12/2020 0421   CALCIUM 8.4 (L) 02/12/2020 0421   PROT 7.4 02/09/2020 0441   ALBUMIN 3.7 02/09/2020 0441   AST 20 02/09/2020 0441   ALT 14 02/09/2020 0441   ALKPHOS 56 02/09/2020 0441   BILITOT 0.6 02/09/2020 0441   GFRNONAA >60 02/12/2020 0421   GFRAA >60 02/12/2020 0421   Lipase     Component Value Date/Time   LIPASE 50 02/08/2020 0116       Studies/Results: CT ABDOMEN PELVIS W CONTRAST  Result Date: 02/11/2020 CLINICAL DATA:  Abdominal pain. Concern for small bowel obstruction versus vascular  abnormality versus other etiology for patient's abdominal pain. EXAM: CT ABDOMEN AND PELVIS WITH CONTRAST TECHNIQUE: Multidetector CT imaging of the abdomen and pelvis was performed using the standard protocol following bolus administration of intravenous contrast. CONTRAST:  1107m OMNIPAQUE IOHEXOL 300 MG/ML  SOLN COMPARISON:  02/08/2020 FINDINGS: Lower chest: No acute abnormality. Hepatobiliary: Prominent lateral segment of left lobe of liver and caudate lobe of liver. The surface of the liver is appears slightly nodular. There is recanalization of the umbilical vein. No focal liver abnormality is seen. No gallstones, gallbladder wall thickening, or biliary dilatation. Pancreas: Unremarkable. No pancreatic ductal dilatation or surrounding inflammatory changes. Spleen: Tiny low-density structure within the medial spleen is unchanged measuring 4 mm, image 18/2. Spleen otherwise unremarkable. 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 Normal left adrenal gland. Bilateral renal cortical scarring is identified. The urinary bladder is unremarkable. Mild bilateral renal cortical scarring. There is no mass or hydronephrosis. The urinary bladder is unremarkable. Stomach/Bowel: Stomach is nondistended. There is mild increase caliber of the proximal small bowel loops. On today's study these measure up to 2.7 cm. This is compared with 3.1 cm previously. Previously administered enteric contrast material is identified within the distal small bowel loops and colon up to  the level of the rectum. No bowel wall thickening, or inflammation. The appendix is visualized and appears normal. Vascular/Lymphatic: Aortic atherosclerosis. The celiac, superior mesenteric, and inferior mesenteric arteries appear patent. The portal vein, portal venous confluence and superior mesenteric vein all appear intact without evidence for thrombus. No aneurysm identified. No abdominopelvic adenopathy.  Reproductive: Uterus and bilateral adnexa are unremarkable. Other: There is a small volume of ascites within the pelvis. Increased in volume from previous exam. There is a fat containing, midline infraumbilical hernia. Musculoskeletal: No acute or significant osseous findings. IMPRESSION: 1. No acute findings identified within the abdomen or pelvis. There is mild increase caliber of the proximal small bowel loops which is improved from the previous exam. Previously administered enteric contrast material is identified within the distal small bowel loops and colon up to the level of the rectum. 2. Morphologic features of the liver suggestive of early cirrhosis. There is recanalization of the umbilical vein which may reflect portal venous hypertension. Small volume of ascites within the pelvis is increased in volume from previous exam. 3. Aortic atherosclerosis. 4. Fat containing, midline infraumbilical hernia. 5. Stable right adrenal gland myelolipoma. Aortic Atherosclerosis (ICD10-I70.0). Electronically Signed   By: Kerby Moors M.D.   On: 02/11/2020 17:02    Anti-infectives: Anti-infectives (From admission, onward)   None       Assessment/Plan PVD, H/O TIA - on daily ASA HTN HLD DM Gastroparesis  Abdominal pain, unclear etiology -repeat CT yesterday with no acute etiology of her abdominal pain, but this has almost essentially resolved as of today -adv to soft diet.  If tolerates this can dc home from our standpoint -no plans for surgical intervention. -can follow up with GI or PCP prn.  Had c-scope and upper endo 1 year ago with no significant findings. -D/W primary service  FEN -soft VTE -heparin ID -none currently needed   LOS: 4 days    [name] , [facility] Surgery 02/12/2020, 10:15 AM Please see Amion for pager number during day hours 7:00am-4:30pm or 7:00am -11:30am on weekends

## 2020-02-12 NOTE — Progress Notes (Signed)
Discharge instruction discussed with patient and family, verbalized agreement and understanding

## 2020-03-01 ENCOUNTER — Emergency Department (HOSPITAL_COMMUNITY)
Admission: EM | Admit: 2020-03-01 | Discharge: 2020-03-01 | Disposition: A | Discharge status: Home / Self Care | Payer: Medicare HMO | Attending: Emergency Medicine | Admitting: Emergency Medicine

## 2020-03-01 ENCOUNTER — Emergency Department (HOSPITAL_COMMUNITY): Payer: Medicare HMO

## 2020-03-01 ENCOUNTER — Other Ambulatory Visit: Payer: Self-pay

## 2020-03-01 DIAGNOSIS — I10 Essential (primary) hypertension: Secondary | ICD-10-CM

## 2020-03-01 DIAGNOSIS — R1011 Right upper quadrant pain: Secondary | ICD-10-CM | POA: Insufficient documentation

## 2020-03-01 DIAGNOSIS — E1140 Type 2 diabetes mellitus with diabetic neuropathy, unspecified: Secondary | ICD-10-CM | POA: Insufficient documentation

## 2020-03-01 DIAGNOSIS — K7460 Unspecified cirrhosis of liver: Secondary | ICD-10-CM | POA: Diagnosis not present

## 2020-03-01 DIAGNOSIS — Z79899 Other long term (current) drug therapy: Secondary | ICD-10-CM | POA: Insufficient documentation

## 2020-03-01 DIAGNOSIS — Z7984 Long term (current) use of oral hypoglycemic drugs: Secondary | ICD-10-CM | POA: Diagnosis not present

## 2020-03-01 DIAGNOSIS — R110 Nausea: Secondary | ICD-10-CM | POA: Diagnosis not present

## 2020-03-01 DIAGNOSIS — K7469 Other cirrhosis of liver: Secondary | ICD-10-CM

## 2020-03-01 DIAGNOSIS — K8050 Calculus of bile duct without cholangitis or cholecystitis without obstruction: Secondary | ICD-10-CM

## 2020-03-01 LAB — URINALYSIS, ROUTINE W REFLEX MICROSCOPIC
Bilirubin Urine: NEGATIVE
Glucose, UA: NEGATIVE mg/dL
Hgb urine dipstick: NEGATIVE
Ketones, ur: NEGATIVE mg/dL
Leukocytes,Ua: NEGATIVE
Nitrite: NEGATIVE
Protein, ur: NEGATIVE mg/dL
Specific Gravity, Urine: 1.004 — ABNORMAL LOW (ref 1.005–1.030)
pH: 7 (ref 5.0–8.0)

## 2020-03-01 LAB — COMPREHENSIVE METABOLIC PANEL
ALT: 15 U/L (ref 0–44)
AST: 22 U/L (ref 15–41)
Albumin: 3.9 g/dL (ref 3.5–5.0)
Alkaline Phosphatase: 42 U/L (ref 38–126)
Anion gap: 10 (ref 5–15)
BUN: 13 mg/dL (ref 8–23)
CO2: 24 mmol/L (ref 22–32)
Calcium: 8.9 mg/dL (ref 8.9–10.3)
Chloride: 107 mmol/L (ref 98–111)
Creatinine, Ser: 0.89 mg/dL (ref 0.44–1.00)
GFR calc Af Amer: >60 mL/min (ref >60)
GFR calc non Af Amer: >60 mL/min (ref >60)
Glucose, Bld: 81 mg/dL (ref 70–99)
Potassium: 4.5 mmol/L (ref 3.5–5.1)
Sodium: 141 mmol/L (ref 135–145)
Total Bilirubin: 0.5 mg/dL (ref 0.3–1.2)
Total Protein: 7.1 g/dL (ref 6.5–8.1)

## 2020-03-01 LAB — CBC WITH DIFFERENTIAL/PLATELET
Abs Immature Granulocytes: 0.02 10*3/uL (ref 0.00–0.07)
Basophils Absolute: 0.1 10*3/uL (ref 0.0–0.1)
Basophils Relative: 1 %
Eosinophils Absolute: 0.2 10*3/uL (ref 0.0–0.5)
Eosinophils Relative: 2 %
HCT: 37.4 % (ref 36.0–46.0)
Hemoglobin: 12.5 g/dL (ref 12.0–15.0)
Immature Granulocytes: 0 %
Lymphocytes Relative: 41 %
Lymphs Abs: 3.8 10*3/uL (ref 0.7–4.0)
MCH: 30.6 pg (ref 26.0–34.0)
MCHC: 33.4 g/dL (ref 30.0–36.0)
MCV: 91.7 fL (ref 80.0–100.0)
Monocytes Absolute: 0.3 10*3/uL (ref 0.1–1.0)
Monocytes Relative: 4 %
Neutro Abs: 4.7 10*3/uL (ref 1.7–7.7)
Neutrophils Relative %: 52 %
Platelets: 167 10*3/uL (ref 150–400)
RBC: 4.08 MIL/uL (ref 3.87–5.11)
RDW: 12.8 % (ref 11.5–15.5)
WBC: 9.1 10*3/uL (ref 4.0–10.5)
nRBC: 0 % (ref 0.0–0.2)

## 2020-03-01 LAB — LIPASE, BLOOD: Lipase: 41 U/L (ref 11–51)

## 2020-03-01 MED ORDER — SODIUM CHLORIDE (PF) 0.9 % IJ SOLN
INTRAMUSCULAR | Status: AC
  Filled 2020-03-01: qty 50

## 2020-03-01 MED ORDER — HYDROMORPHONE HCL 1 MG/ML IJ SOLN
1.0000 mg | Freq: Once | INTRAMUSCULAR | Status: AC
  Administered 2020-03-01: 1 mg via INTRAVENOUS
  Filled 2020-03-01: qty 1

## 2020-03-01 MED ORDER — MORPHINE SULFATE (PF) 4 MG/ML IV SOLN
4.0000 mg | Freq: Once | INTRAVENOUS | Status: AC
  Administered 2020-03-01: 4 mg via INTRAVENOUS
  Filled 2020-03-01: qty 1

## 2020-03-01 MED ORDER — OXYCODONE-ACETAMINOPHEN 5-325 MG PO TABS: 1.0000 | ORAL_TABLET | Freq: Four times a day (QID) | ORAL | 0 refills | Status: DC | PRN

## 2020-03-01 MED ORDER — SODIUM CHLORIDE 0.9 % IV BOLUS
1000.0000 mL | Freq: Once | INTRAVENOUS | Status: AC
  Administered 2020-03-01: 1000 mL via INTRAVENOUS

## 2020-03-01 MED ORDER — DILTIAZEM HCL ER COATED BEADS 180 MG PO CP24
180.0000 mg | ORAL_CAPSULE | Freq: Once | ORAL | Status: AC
  Administered 2020-03-01: 180 mg via ORAL
  Filled 2020-03-01: qty 1

## 2020-03-01 MED ORDER — IOHEXOL 300 MG/ML  SOLN
100.0000 mL | Freq: Once | INTRAMUSCULAR | Status: AC | PRN
  Administered 2020-03-01: 100 mL via INTRAVENOUS

## 2020-03-01 MED ORDER — ONDANSETRON 4 MG PO TBDP
ORAL_TABLET | ORAL | 0 refills | Status: DC
Start: 2020-03-01 — End: 2021-04-19

## 2020-03-01 NOTE — Discharge Instructions (Signed)
You have some gallstones and that may be causing your pain.  Take some Tylenol or Motrin for pain.  Take Percocet for severe pain.  Take Zofran for nausea  Follow-up with the surgeon.  You have cirrhosis as well so please continue to follow-up with your GI doctor  Return to ER if you have severe abdominal pain, vomiting.

## 2020-03-01 NOTE — ED Triage Notes (Signed)
Pt BIBA from home.   Per EMS- Pt recently dx with cirrhosis. Pt c/o RUQ abdominal pain. Pt reports new onset pain after having lunch (1230) today, c/o n/v. Denies chest pain. Denies alcohol intake.   EMS gave 200 mcg fentanyl; last given 1646. --> pain now 2/10.   4 mg zofran  20 g L forearm AOx4, RA

## 2020-03-01 NOTE — ED Provider Notes (Signed)
[facility] DEPT Provider Note   CSN: [id number] Arrival date & time: 03/01/20  1654     History Chief Complaint  Patient presents with  . Abdominal Pain    Carrie [name] is a 64 y.o. female hx of depression, diabetes, hypertension, hyperlipidemia, recent small bowel obstruction and new onset cirrhosis who presented with right upper quadrant pain. Patient was just at vascular surgeon's office this morning. She had a dedicated ultrasound that showed the SMA occlusion but her celiac and IMA were not occluded. She states that she is doing fine after ultrasound but shortly after lunch, she had acute onset of right upper quadrant pain. She states that the pain is 10 out of 10 at that time. Associated with some nausea as well. States that since her diagnosis of SBO, she noticed a knot in the right upper quadrant. Denies any chest pain or shortness of breath.  The history is provided by the patient.       Past Medical History:  Diagnosis Date  . Anxiety   . Arthritis    psoriatic arithritis, DDD   . Asthma   . Depression   . Diabetes mellitus   . Gastritis   . Gastroparesis   . GERD (gastroesophageal reflux disease)   . Hernia   . Hyperlipemia   . Hyperlipemia   . Hypertension   . Neuropathy    Bil feet re: to Diabetes  . Obesity   . Osteoporosis   . Plantar fasciitis   . Stricture and stenosis of esophagus   . TIA (transient ischemic attack)    8-10 yrs ago, no problems since  . Tuberculosis    tested positive 2011, no symptoms, was on medicine for 6 months    Patient Active Problem List   Diagnosis Date Noted  . Bowel obstruction ([place]) 02/08/2020  . Leukocytosis 02/08/2020  . 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 Psoriasis 07/08/2017  . Diabetes ([place]) 02/16/2016  . Chest pain 12/04/2013  . Diabetes mellitus type II, uncontrolled ([place])  12/04/2013  . HTN (hypertension) 12/04/2013  . Hypoglycemia 12/04/2013  . Pyelonephritis 10/10/2013  . UTI (lower urinary tract infection) 10/10/2013  . HBP (high blood pressure) 04/21/2013  . Gastroparesis 02/25/2013  . Cough 02/25/2013  . Nausea and vomiting 01/06/2013  . Nausea alone 05/15/2011  . STRICTURE AND STENOSIS OF ESOPHAGUS 10/21/2009  . Hyperlipidemia 01/09/2008  . OBESITY 01/09/2008  . Asthma 01/09/2008  . GERD 01/09/2008  . HERNIA, VENTRAL 01/09/2008  . ARTHRITIS 01/09/2008  . Depression 03/25/2007  . ANXIETY 03/25/2007    Past Surgical History:  Procedure Laterality Date  . BREAST SURGERY Bilateral    biopsies both breast  . CARDIAC CATHETERIZATION    . CESAREAN SECTION     x 2   . COLONOSCOPY    . JOINT REPLACEMENT     bilat knee   . KNEE SURGERY Bilateral    x 2 joint knee replacements  . UPPER GASTROINTESTINAL ENDOSCOPY    . WISDOM TOOTH EXTRACTION       OB History   No obstetric history on file.     Family History  Problem Relation Age of Onset  . Diabetes Other        maternal aunts and uncles  . Diabetes Father   . Liver disease Father   . Emphysema Father        smoked  . Diabetes  Sister   . Diabetes Brother   . Sarcoidosis Brother   . Emphysema Mother        smoked  . Asthma Mother   . Asthma Brother   . Colon cancer Neg Hx   . Esophageal cancer Neg Hx   . Stomach cancer Neg Hx   . Rectal cancer Neg Hx     Social History   Tobacco Use  . Smoking status: Never Smoker  . Smokeless tobacco: Never Used  Substance Use Topics  . Alcohol use: Not Currently    Comment: socially  . Drug use: No    Home Medications Prior to Admission medications   Medication Sig Start Date End Date Taking? Authorizing Provider  Accu-Chek Softclix Lancets lancets 1 each by Other route 2 (two) times daily. E11.9 11/24/19   Renato Shin, MD  acetaminophen (TYLENOL) 325 MG tablet Take 325-650 mg by mouth every 6 (six) hours as needed for mild pain  or headache.    [provider]  albuterol (PROAIR HFA) 108 (90 BASE) MCG/ACT inhaler Inhale 2 puffs into the lungs every 4 (four) hours as needed for wheezing or shortness of breath.    [provider]  Alcohol Swabs (B-D SINGLE USE SWABS REGULAR) PADS 1 each by Does not apply route 2 (two) times daily. Use to prep site prior to obtaining droplet of blood; E11.9 11/24/19   Renato Shin, MD  amLODipine (NORVASC) 5 MG tablet Take 1 tablet (5 mg total) by mouth daily. Patient not taking: Reported on 02/08/2020 09/30/15   Dhungel, Flonnie Overman, MD  aspirin EC 81 MG tablet Take 81 mg by mouth every morning.    [provider]  benzonatate (TESSALON) 200 MG capsule Take 200 mg by mouth 3 (three) times daily as needed for cough. 01/22/20   [provider]  Blood Glucose Calibration (ACCU-CHEK AVIVA) SOLN 1 each by Other route as needed (Use prn to calibrate glucometer). E11.9 11/24/19   Renato Shin, MD  Blood Glucose Monitoring Suppl (ACCU-CHEK AVIVA PLUS) w/Device KIT 1 each by Does not apply route 2 (two) times daily. E11.9 11/24/19   Renato Shin, MD  busPIRone (BUSPAR) 5 MG tablet Take 5 mg by mouth at bedtime.     [provider]  Calcium Carbonate-Vitamin D 600-400 MG-UNIT per tablet Take 1 tablet by mouth daily. 04/26/15   [provider]  diclofenac Sodium (VOLTAREN) 1 % GEL Apply 4 g topically 4 (four) times daily as needed for muscle pain. 01/14/20   [provider]  diltiazem (CARDIZEM CD) 180 MG 24 hr capsule Take 180 mg by mouth daily. 01/19/20   [provider]  esomeprazole (NEXIUM) 20 MG capsule Take 20 mg by mouth every morning. 01/18/20   [provider]  folic acid (FOLVITE) 1 MG tablet Take 1 mg by mouth at bedtime.     [provider]  gabapentin (NEURONTIN) 300 MG capsule Take 300 mg by mouth at bedtime.  04/26/15   [provider]  glucose blood (ACCU-CHEK AVIVA) test strip 1 each by Other route 2 (two)  times daily. E11.9 11/24/19   Renato Shin, MD  HUMIRA PEN 40 MG/0.4ML PNKT Inject 0.8 mLs into the skin every Wednesday. 01/01/19   [provider]  memantine (NAMENDA) 10 MG tablet Take 10 mg by mouth at bedtime.     [provider]  metFORMIN (GLUCOPHAGE) 1000 MG tablet Take 1 tablet (1,000 mg total) by mouth 2 (two) times daily with a meal.  03/25/19   Renato Shin, MD  methocarbamol (ROBAXIN) 500 MG tablet Take 500 mg by mouth at bedtime.  10/20/18   [provider]  methotrexate 50 MG/2ML injection Inject 20 mg into the skin once a week.     [provider]  nystatin cream (MYCOSTATIN) Apply 1 application topically 2 (two) times daily as needed for dry skin (yeast infection on breast).  12/17/18   [provider]  olmesartan (BENICAR) 40 MG tablet Take 40 mg by mouth daily. 01/26/20   [provider]  ondansetron (ZOFRAN ODT) 4 MG disintegrating tablet 48m ODT q4 hours prn nausea/vomit 03/01/20   YDrenda Freeze MD  oxyCODONE-acetaminophen (PERCOCET) 5-325 MG tablet Take 1 tablet by mouth every 6 (six) hours as needed. 03/01/20   YDrenda Freeze MD  PEG-KCl-NaCl-NaSulf-Na Asc-C (PLENVU) 140 g SOLR Take 140 g by mouth as directed. Patient not taking: Reported on 02/08/2020 08/15/18   DDoran Stabler MD  pravastatin (PRAVACHOL) 20 MG tablet Take 20 mg by mouth daily.    [provider]  primidone (MYSOLINE) 250 MG tablet Take 250 mg by mouth in the morning and at bedtime.     [provider]  promethazine-dextromethorphan (PROMETHAZINE-DM) 6.25-15 MG/5ML syrup Take 5 mLs by mouth 4 (four) times daily as needed for cough. 01/22/20   [provider]  Semaglutide, 1 MG/DOSE, (OZEMPIC, 1 MG/DOSE,) 2 MG/1.5ML SOPN Inject 1 mg into the skin once a week. 12/17/19   ERenato Shin MD  sertraline (ZOLOFT) 50 MG tablet Take 75 mg by mouth at bedtime.     [provider]    Allergies    Lisinopril  Review of Systems    Review of Systems  Gastrointestinal: Positive for abdominal pain.  All other systems reviewed and are negative.   Physical Exam Updated Vital Signs BP (!) 162/84   Pulse 86   Temp 98.7 F (37.1 C) (Oral)   Resp 15   LMP  (LMP Unknown)   SpO2 100%   Physical Exam Vitals and nursing note reviewed.  HENT:     Head: Normocephalic.     Mouth/Throat:     Mouth: Mucous membranes are moist.  Eyes:     Extraocular Movements: Extraocular movements intact.  Cardiovascular:     Rate and Rhythm: Normal rate and regular rhythm.     Heart sounds: Normal heart sounds.  Pulmonary:     Effort: Pulmonary effort is normal.     Breath sounds: Normal breath sounds.  Abdominal:     Comments: RUQ tenderness, ? Murphy's   Skin:    General: Skin is warm.  Neurological:     General: No focal deficit present.     Mental Status: She is alert.  Psychiatric:        Mood and Affect: Mood normal.        Behavior: Behavior normal.     ED Results / Procedures / Treatments   Labs (all labs ordered are listed, but only abnormal results are displayed) Labs Reviewed  URINALYSIS, ROUTINE W REFLEX MICROSCOPIC - Abnormal; Notable for the following components:      Result Value   Color, Urine COLORLESS (*)    Specific Gravity, Urine 1.004 (*)    All other components within normal limits  CBC WITH DIFFERENTIAL/PLATELET  COMPREHENSIVE METABOLIC PANEL  LIPASE, BLOOD    EKG None  Radiology CT ABDOMEN PELVIS W CONTRAST  Result Date: 03/01/2020 CLINICAL DATA:  Abdominal distension. Recent cirrhosis diagnosis.  New onset pain after lunch today. Nausea and vomiting. Recent hospitalization for small bowel obstruction. EXAM: CT ABDOMEN AND PELVIS WITH CONTRAST TECHNIQUE: Multidetector CT imaging of the abdomen and pelvis was performed using the standard protocol following bolus administration of intravenous contrast. CONTRAST:  165m OMNIPAQUE IOHEXOL 300 MG/ML  SOLN COMPARISON:  CT 3 weeks ago 02/11/2020,  also 02/08/2020. Abdominal ultrasound earlier today. FINDINGS: Lower chest: No focal consolidation or pleural effusion. Unchanged heart size. Hepatobiliary: Distended gallbladder. Gallstones on earlier ultrasound are tentatively visualized. No definite pericholecystic inflammation. Mild central intrahepatic biliary ductal prominence without common bile duct dilatation. Subtle capsular nodularity is again seen. No focal hepatic lesion. Pancreas: No ductal dilatation or inflammation. Spleen: Normal in size without focal abnormality. Adrenals/Urinary Tract: Normal adrenal glands. Lobulated renal contours without hydronephrosis or perinephric edema. No suspicious renal lesion. Symmetric renal excretion on delayed phase imaging. Minimally distended urinary bladder. No bladder wall thickening. Stomach/Bowel: Mild distal esophageal wall thickening. Stomach is unremarkable. No evidence of small bowel obstruction or abnormal small bowel distension. Fluid within scattered nondilated distal small bowel loops. No terminal ileal inflammation. No appendicitis, the appendix is less well visualized than on prior exam. Small to moderate volume of stool in the colon. No colonic wall thickening or inflammation. Vascular/Lymphatic: Age advanced aortic atherosclerosis. Atherosclerotic stenosis at the origin of the SMA, redemonstrated. No acute vascular findings. Distal SMA branches are patent. Patent portal vein. Retroaortic left renal vein. Few prominent periportal nodes are likely reactive. Reproductive: Uterus and bilateral adnexa are unremarkable. Other: Unchanged midline infraumbilical fat containing ventral abdominal wall hernia. Tiny fat containing umbilical hernia. No inflammatory change or bowel involvement. No significant ascites. No free air. Musculoskeletal: There are no acute or suspicious osseous abnormalities. IMPRESSION: 1. No acute findings in the abdomen/pelvis. Particularly, no evidence of bowel obstruction. Improved  small bowel dilatation from recent imaging. 2. Mild gallbladder distension, previously evaluated by ultrasound today. Gallstones on ultrasound are not well delineated by CT. 3. Cirrhotic hepatic morphology. 4. Additional chronic findings are stable from recent exam. Aortic Atherosclerosis (ICD10-I70.0). Electronically Signed   By: MKeith RakeM.D.   On: 03/01/2020 20:33   UK[place]Abdomen Limited RUQ  Result Date: 03/01/2020 CLINICAL DATA:  Abdominal pain. EXAM: ULTRASOUND ABDOMEN LIMITED RIGHT UPPER QUADRANT COMPARISON:  CT dated 02/11/2020 FINDINGS: Gallbladder: There are gallstones without gallbladder wall thickening or pericholecystic free fluid. The sonographic MPercell Millersign is reported as negative. Common bile duct: Diameter: 2.4 mm Liver: The liver surface is nodular. There is no discrete hepatic mass. Portal vein is patent on color Doppler imaging with normal direction of blood flow towards the liver. Other: None. IMPRESSION: 1. There is cholelithiasis without secondary signs of acute cholecystitis. 2. Probable underlying cirrhosis without evidence for discrete hepatic mass. Electronically Signed   By: CConstance HolsterM.D.   On: 03/01/2020 18:51    Procedures Procedures (including critical care time)  Medications Ordered in ED Medications  morphine 4 MG/ML injection 4 mg (4 mg Intravenous Given 03/01/20 1830)  sodium chloride 0.9 % bolus 1,000 mL (0 mLs Intravenous Stopped 03/01/20 2003)  sodium chloride (PF) 0.9 % injection (  Given by Other 03/01/20 2038)  morphine 4 MG/ML injection 4 mg (4 mg Intravenous Given 03/01/20 2003)  iohexol (OMNIPAQUE) 300 MG/ML solution 100 mL (100 mLs Intravenous Contrast Given 03/01/20 2010)  HYDROmorphone (DILAUDID) injection 1 mg (1 mg Intravenous Given 03/01/20 2102)  diltiazem (CARDIZEM CD) 24 hr capsule 180 mg (180 mg Oral Given 03/01/20 2105)  ED Course  I have reviewed the triage vital signs and the nursing notes.  Pertinent labs & imaging results  that were available during my care of the patient were reviewed by me and considered in my medical decision making (see chart for details).    MDM Rules/Calculators/A&P                     Carrie [name] is a 64 y.o. female history of cirrhosis here presenting with right upper quadrant pain. Consider biliary colic versus worsening cirrhosis. Patient does not drink any alcohol. She also had a dedicated vascular ultrasound this morning that showed a occluded SMA but patent IMA and celiac so I doubt mesenteric ischemia. Will get CBC, CMP, lipase, right upper quadrant ultrasound and CT abdomen pelvis to further assess.   9:11 PM Labs unremarkable. CT ab/pel showed stable cirrhosis.  Ultrasound showed cholelithiasis with no cholecystitis.  Her white blood cell count is normal.  She is hypertensive but has a history of hypertension.  I wonder if she has some biliary colic.  Pain with improved with pain meds.  Will refer to surgery for follow-up and will give short course of Percocet for pain.   Final Clinical Impression(s) / ED Diagnoses Final diagnoses:  RUQ pain    Rx / DC Orders ED Discharge Orders         Ordered    oxyCODONE-acetaminophen (PERCOCET) 5-325 MG tablet  Every 6 hours PRN     03/01/20 2102    ondansetron (ZOFRAN ODT) 4 MG disintegrating tablet     03/01/20 2110           Drenda Freeze, MD 03/01/20 2113

## 2020-03-04 ENCOUNTER — Telehealth: Payer: Self-pay | Admitting: Gastroenterology

## 2020-03-04 NOTE — Telephone Encounter (Signed)
Pt reported that she is experiencing severe RUQ pain.  Please advise.

## 2020-03-04 NOTE — Telephone Encounter (Signed)
Left message for pt to call back  °

## 2020-03-09 ENCOUNTER — Ambulatory Visit: Payer: Medicare HMO | Admitting: Nurse Practitioner

## 2020-03-15 NOTE — Telephone Encounter (Signed)
Spoke with patient, patient states that she was admitted to [facility] on 03/04/2020, pt states that she had cholecystectomy, liver biopsy, EGD, and 2 hernias removed on 03/07/20. Notes are available in Care Everywhere.

## 2020-03-16 DIAGNOSIS — K7460 Unspecified cirrhosis of liver: Secondary | ICD-10-CM | POA: Insufficient documentation

## 2020-03-22 ENCOUNTER — Other Ambulatory Visit: Payer: Self-pay | Admitting: Endocrinology

## 2020-04-01 ENCOUNTER — Other Ambulatory Visit: Payer: Self-pay | Admitting: Endocrinology

## 2020-04-05 ENCOUNTER — Other Ambulatory Visit: Payer: Self-pay

## 2020-04-05 DIAGNOSIS — Z794 Long term (current) use of insulin: Secondary | ICD-10-CM

## 2020-04-05 MED ORDER — ACCU-CHEK SOFTCLIX LANCETS MISC: 1.0000 | Freq: Two times a day (BID) | 0 refills | Status: DC

## 2020-04-05 MED ORDER — GLUCOSE BLOOD VI STRP: 1.0000 | ORAL_STRIP | Freq: Two times a day (BID) | 0 refills | Status: DC

## 2020-04-05 MED ORDER — ACCU-CHEK AVIVA VI SOLN: 1.0000 | 0 refills | Status: DC | PRN

## 2020-04-08 ENCOUNTER — Telehealth: Payer: Self-pay | Admitting: Endocrinology

## 2020-04-08 ENCOUNTER — Other Ambulatory Visit: Payer: Self-pay

## 2020-04-08 DIAGNOSIS — E1142 Type 2 diabetes mellitus with diabetic polyneuropathy: Secondary | ICD-10-CM

## 2020-04-08 MED ORDER — BD SWAB SINGLE USE REGULAR PADS: 1.0000 | MEDICATED_PAD | Freq: Two times a day (BID) | 0 refills | Status: DC

## 2020-04-08 NOTE — Telephone Encounter (Signed)
Medication Refill Request  Did you call your pharmacy and request this refill first?   . If patient has not contacted pharmacy first, instruct them to do so for future refills.  . Remind them that contacting the pharmacy for their refill is the quickest method to get the refill.  . Refill policy also stated that it will take anywhere between 24-72 hours to receive the refill.    Name of medication? Alcohol swabs  Is this a 90 day supply? yes  Name and location of pharmacy?  [place], [place] Phone:  [phone]  Fax:  [phone]

## 2020-04-08 NOTE — Telephone Encounter (Signed)
Outpatient Medication Detail   Disp Refills Start End   Alcohol Swabs (B-D SINGLE USE SWABS REGULAR) PADS 200 each 0 04/08/2020    Sig - Route: 1 each by Does not apply route 2 (two) times daily. Cleanse site BID for glucose testing; E11.9 - Does not apply   Sent to pharmacy as: Alcohol Swabs (B-D SINGLE USE SWABS REGULAR) Pads   E-Prescribing Status: Receipt confirmed by pharmacy (04/08/2020 10:28 AM EDT)

## 2020-05-05 ENCOUNTER — Ambulatory Visit (INDEPENDENT_AMBULATORY_CARE_PROVIDER_SITE_OTHER): Payer: Medicare HMO | Admitting: Endocrinology

## 2020-05-05 ENCOUNTER — Encounter: Payer: Self-pay | Admitting: Endocrinology

## 2020-05-05 ENCOUNTER — Other Ambulatory Visit: Payer: Self-pay

## 2020-05-05 VITALS — BP 140/80 | HR 88 | Ht 60.0 in | Wt 133.4 lb

## 2020-05-05 DIAGNOSIS — E1142 Type 2 diabetes mellitus with diabetic polyneuropathy: Secondary | ICD-10-CM | POA: Diagnosis not present

## 2020-05-05 DIAGNOSIS — Z794 Long term (current) use of insulin: Secondary | ICD-10-CM | POA: Diagnosis not present

## 2020-05-05 LAB — POCT GLYCOSYLATED HEMOGLOBIN (HGB A1C): Hemoglobin A1C: 5.6 % (ref 4.0–5.6)

## 2020-05-05 MED ORDER — METFORMIN HCL 1000 MG PO TABS: 1000.0000 mg | ORAL_TABLET | Freq: Every day | ORAL | 3 refills | Status: DC

## 2020-05-05 NOTE — Progress Notes (Signed)
Subjective:    Patient ID: Carrie [name], female    DOB: [birth year], 63 y.o.   MRN: [id number]  HPI Pt returns for f/u of diabetes mellitus: DM type: 2 Dx'ed: 2010. 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   Therapy: metformin and Ozempic.   GDM: 2000.  DKA: never. Severe hypoglycemia: never.  Pancreatitis: never.  SDOH: she was on multiple daily injections in the past, but could not remember to take.  Other: she did not tolerate invokana (vaginitis); psoriasis raises possibility she is evolving type 1 DM; edema limits rx options.  She took insulin 2011-2121.  Interval history: pt states cbg's are well-controlled.  She says she never misses the medications.  She was recently in the hospital with n/v.  It resolved with cholecystectomy.  Past Medical History:  Diagnosis Date  . Anxiety   . Arthritis    psoriatic arithritis, DDD   . Asthma   . Depression   . Diabetes mellitus   . Gastritis   . Gastroparesis   . GERD (gastroesophageal reflux disease)   . Hernia   . Hyperlipemia   . Hyperlipemia   . Hypertension   . Neuropathy    Bil feet re: to Diabetes  . Obesity   . Osteoporosis   . Plantar fasciitis   . Stricture and stenosis of esophagus   . TIA (transient ischemic attack)    8-10 yrs ago, no problems since  . Tuberculosis    tested positive 2011, no symptoms, was on medicine for 6 months    Past Surgical History:  Procedure Laterality Date  . BREAST SURGERY Bilateral    biopsies both breast  . CARDIAC CATHETERIZATION    . CESAREAN SECTION     x 2   . COLONOSCOPY    . JOINT REPLACEMENT     bilat knee   . KNEE SURGERY Bilateral    x 2 joint knee replacements  . UPPER GASTROINTESTINAL ENDOSCOPY    . WISDOM TOOTH EXTRACTION      Social History   Socioeconomic History  . Marital status: Single    Spouse name: Not on file  . Number of children: 2  . Years of education: Not on file  . Highest education level: Not on file  Occupational History  . Occupation:  Disabled   Tobacco Use  . Smoking status: Never Smoker  . Smokeless tobacco: Never Used  Vaping Use  . Vaping Use: Never used  Substance and Sexual Activity  . Alcohol use: Not Currently    Comment: socially  . Drug use: No  . Sexual activity: Not Currently    Birth control/protection: Post-menopausal  Other Topics Concern  . Not on file  Social History Narrative   [place] daily    Social Determinants of Health   Financial Resource Strain:   . Difficulty of Paying Living Expenses:   Food Insecurity:   . Worried About Charity fundraiser in the Last Year:   . Arboriculturist in the Last Year:   Transportation Needs:   . Film/video editor (Medical):   Marland Kitchen Lack of Transportation (Non-Medical):   Physical Activity:   . Days of Exercise per Week:   . Minutes of Exercise per Session:   Stress:   . Feeling of Stress :   Social Connections:   . Frequency of Communication with Friends and Family:   . Frequency of Social Gatherings with Friends and Family:   . Attends Religious Services:   .  Active Member of Clubs or Organizations:   . Attends Archivist Meetings:   Marland Kitchen Marital Status:   Intimate Partner Violence:   . Fear of Current or Ex-Partner:   . Emotionally Abused:   Marland Kitchen Physically Abused:   . Sexually Abused:     Current Outpatient Medications on File Prior to Visit  Medication Sig Dispense Refill  . Accu-Chek Softclix Lancets lancets 1 each by Other route 2 (two) times daily. E11.9 200 each 0  . acetaminophen (TYLENOL) 325 MG tablet Take 325-650 mg by mouth every 6 (six) hours as needed for mild pain or headache.    . albuterol (PROAIR HFA) 108 (90 BASE) MCG/ACT inhaler Inhale 2 puffs into the lungs every 4 (four) hours as needed for wheezing or shortness of breath.    . Alcohol Swabs (B-D SINGLE USE SWABS REGULAR) PADS 1 each by Does not apply route 2 (two) times daily. Cleanse site BID for glucose testing; E11.9 200 each 0  . amLODipine (NORVASC) 5 MG  tablet Take 1 tablet (5 mg total) by mouth daily. 30 tablet 0  . aspirin EC 81 MG tablet Take 81 mg by mouth every morning.    . benzonatate (TESSALON) 200 MG capsule Take 200 mg by mouth 3 (three) times daily as needed for cough.    . Blood Glucose Calibration (ACCU-CHEK AVIVA) SOLN 1 each by Other route as needed (Use prn to calibrate glucometer). E11.9 1 each 0  . Blood Glucose Monitoring Suppl (ACCU-CHEK AVIVA PLUS) w/Device KIT USE AS DIRECTED 1 kit 0  . busPIRone (BUSPAR) 5 MG tablet Take 5 mg by mouth at bedtime.     . Calcium Carbonate-Vitamin D 600-400 MG-UNIT per tablet Take 1 tablet by mouth daily.    . diclofenac Sodium (VOLTAREN) 1 % GEL Apply 4 g topically 4 (four) times daily as needed for muscle pain.    Marland Kitchen diltiazem (CARDIZEM CD) 180 MG 24 hr capsule Take 180 mg by mouth daily.    Marland Kitchen esomeprazole (NEXIUM) 20 MG capsule Take 20 mg by mouth every morning.    . folic acid (FOLVITE) 1 MG tablet Take 1 mg by mouth at bedtime.     . gabapentin (NEURONTIN) 300 MG capsule Take 300 mg by mouth at bedtime.   5  . glucose blood (ACCU-CHEK AVIVA) test strip 1 each by Other route 2 (two) times daily. E11.9 200 each 0  . HUMIRA PEN 40 MG/0.4ML PNKT Inject 0.8 mLs into the skin every Wednesday.    . memantine (NAMENDA) 10 MG tablet Take 10 mg by mouth at bedtime.     . methocarbamol (ROBAXIN) 500 MG tablet Take 500 mg by mouth at bedtime.     . methotrexate 50 MG/2ML injection Inject 20 mg into the skin once a week.     . nystatin cream (MYCOSTATIN) Apply 1 application topically 2 (two) times daily as needed for dry skin (yeast infection on breast).     Marland Kitchen olmesartan (BENICAR) 40 MG tablet Take 40 mg by mouth daily.    . ondansetron (ZOFRAN ODT) 4 MG disintegrating tablet 74m ODT q4 hours prn nausea/vomit 10 tablet 0  . oxyCODONE-acetaminophen (PERCOCET) 5-325 MG tablet Take 1 tablet by mouth every 6 (six) hours as needed. 10 tablet 0  . OZEMPIC, 1 MG/DOSE, 2 MG/1.5ML SOPN INJECT 1 MG INTO THE  SKIN ONCE A WEEK. 6 pen 1  . PEG-KCl-NaCl-NaSulf-Na Asc-C (PLENVU) 140 g SOLR Take 140 g by mouth as directed. 1  each 0  . pravastatin (PRAVACHOL) 20 MG tablet Take 20 mg by mouth daily.    . primidone (MYSOLINE) 250 MG tablet Take 250 mg by mouth in the morning and at bedtime.     . promethazine-dextromethorphan (PROMETHAZINE-DM) 6.25-15 MG/5ML syrup Take 5 mLs by mouth 4 (four) times daily as needed for cough.    . sertraline (ZOLOFT) 50 MG tablet Take 75 mg by mouth at bedtime.      No current facility-administered medications on file prior to visit.    Allergies  Allergen Reactions  . Lisinopril Cough    Family History  Problem Relation Age of Onset  . Diabetes Other        maternal aunts and uncles  . Diabetes Father   . Liver disease Father   . Emphysema Father        smoked  . Diabetes Sister   . Diabetes Brother   . Sarcoidosis Brother   . Emphysema Mother        smoked  . Asthma Mother   . Asthma Brother   . Colon cancer Neg Hx   . Esophageal cancer Neg Hx   . Stomach cancer Neg Hx   . Rectal cancer Neg Hx     BP (!) 140/80   Pulse 88   Ht 5' (1.524 m)   Wt 133 lb 6.4 oz (60.5 kg)   LMP  (LMP Unknown)   SpO2 97%   BMI 26.05 kg/m     Review of Systems She has lost 20 lbs since last ov here. She denies hypoglycemia.      Objective:   Physical Exam VITAL SIGNS:  See vs page GENERAL: no distress Pulses: dorsalis pedis intact bilat.   MSK: no deformity of the feet CV: no leg edema Skin:  no ulcer on the feet.  normal color and temp on the feet. Neuro: sensation is intact to touch on the feet  Lab Results  Component Value Date   HGBA1C 5.6 05/05/2020        Assessment & Plan:  Type 2 DM, with CRI: well-controlled N/V: we discussed.  Pt says this is better, so she does not want to reduce Ozempic.  She requests to instead reduce the metformin.   HTN: is noted today  Patient Instructions  Your blood pressure is high today.  Please see your  primary care provider soon, to have it rechecked. I have sent a prescription to your pharmacy, to reduce the metformin Please continue the same Ozempic check your blood sugar once a day.  vary the time of day when you check, between before the 3 meals, and at bedtime.  also check if you have symptoms of your blood sugar being too high or too low.  please keep a record of the readings and bring it to your next appointment here (or you can bring the meter itself).  You can write it on any piece of paper.  please call us sooner if your blood sugar goes below 70, or if you have a lot of readings over 200.   Please come back for a follow-up appointment in 3-4 months.

## 2020-05-05 NOTE — Patient Instructions (Addendum)
Your blood pressure is high today.  Please see your primary care provider soon, to have it rechecked. I have sent a prescription to your pharmacy, to reduce the metformin Please continue the same Ozempic check your blood sugar once a day.  vary the time of day when you check, between before the 3 meals, and at bedtime.  also check if you have symptoms of your blood sugar being too high or too low.  please keep a record of the readings and bring it to your next appointment here (or you can bring the meter itself).  You can write it on any piece of paper.  please call us sooner if your blood sugar goes below 70, or if you have a lot of readings over 200.   Please come back for a follow-up appointment in 3-4 months.

## 2020-07-30 ENCOUNTER — Ambulatory Visit: Payer: Medicare HMO

## 2020-09-06 ENCOUNTER — Ambulatory Visit: Payer: Medicare HMO | Admitting: Endocrinology

## 2020-09-20 ENCOUNTER — Encounter: Payer: Self-pay | Admitting: Endocrinology

## 2020-09-20 ENCOUNTER — Other Ambulatory Visit: Payer: Self-pay

## 2020-09-20 ENCOUNTER — Ambulatory Visit (INDEPENDENT_AMBULATORY_CARE_PROVIDER_SITE_OTHER): Payer: Medicare HMO | Admitting: Endocrinology

## 2020-09-20 VITALS — BP 132/84 | HR 81 | Ht 60.0 in | Wt 138.6 lb

## 2020-09-20 DIAGNOSIS — Z794 Long term (current) use of insulin: Secondary | ICD-10-CM | POA: Diagnosis not present

## 2020-09-20 DIAGNOSIS — E1142 Type 2 diabetes mellitus with diabetic polyneuropathy: Secondary | ICD-10-CM | POA: Diagnosis not present

## 2020-09-20 LAB — POCT GLYCOSYLATED HEMOGLOBIN (HGB A1C): Hemoglobin A1C: 6.2 % — AB (ref 4.0–5.6)

## 2020-09-20 NOTE — Patient Instructions (Addendum)
Please continue the same metformin and Ozempic check your blood sugar once a day.  vary the time of day when you check, between before the 3 meals, and at bedtime.  also check if you have symptoms of your blood sugar being too high or too low.  please keep a record of the readings and bring it to your next appointment here (or you can bring the meter itself).  You can write it on any piece of paper.  please call us sooner if your blood sugar goes below 70, or if you have a lot of readings over 200.   Please come back for a follow-up appointment in 6 months.

## 2020-09-20 NOTE — Progress Notes (Signed)
Subjective:    Patient ID: Carrie [name], female    DOB: Jun 30, 1956, 64 y.o.   MRN: [id number]  HPI Pt returns for f/u of diabetes mellitus: DM type: 2 Dx'ed: 2010. 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   Therapy: metformin and Ozempic.   GDM: 2000.  DKA: never. Severe hypoglycemia: never.  Pancreatitis: never.  SDOH: she was on multiple daily injections in the past, but could not remember to take.  Other: she did not tolerate invokana (vaginitis); psoriasis raises possibility she is evolving type 1 DM; edema limits rx options.  She took insulin 2011-2121.  Interval history: pt states cbg's are well-controlled.  She says she never misses the medications. She took prednisone last month.  Past Medical History:  Diagnosis Date  . Anxiety   . Arthritis    psoriatic arithritis, DDD   . Asthma   . Depression   . Diabetes mellitus   . Gastritis   . Gastroparesis   . GERD (gastroesophageal reflux disease)   . Hernia   . Hyperlipemia   . Hyperlipemia   . Hypertension   . Neuropathy    Bil feet re: to Diabetes  . Obesity   . Osteoporosis   . Plantar fasciitis   . Stricture and stenosis of esophagus   . TIA (transient ischemic attack)    8-10 yrs ago, no problems since  . Tuberculosis    tested positive 2011, no symptoms, was on medicine for 6 months    Past Surgical History:  Procedure Laterality Date  . BREAST SURGERY Bilateral    biopsies both breast  . CARDIAC CATHETERIZATION    . CESAREAN SECTION     x 2   . COLONOSCOPY    . JOINT REPLACEMENT     bilat knee   . KNEE SURGERY Bilateral    x 2 joint knee replacements  . UPPER GASTROINTESTINAL ENDOSCOPY    . WISDOM TOOTH EXTRACTION      Social History   Socioeconomic History  . Marital status: Single    Spouse name: Not on file  . Number of children: 2  . Years of education: Not on file  . Highest education level: Not on file  Occupational History  . Occupation: Disabled   Tobacco Use  . Smoking status: Never  Smoker  . Smokeless tobacco: Never Used  Vaping Use  . Vaping Use: Never used  Substance and Sexual Activity  . Alcohol use: Not Currently    Comment: socially  . Drug use: No  . Sexual activity: Not Currently    Birth control/protection: Post-menopausal  Other Topics Concern  . Not on file  Social History Narrative   [place]as daily    Social Determinants of Health   Financial Resource Strain: Not on file  Food Insecurity: Not on file  Transportation Needs: Not on file  Physical Activity: Not on file  Stress: Not on file  Social Connections: Not on file  Intimate Partner Violence: Not on file    Current Outpatient Medications on File Prior to Visit  Medication Sig Dispense Refill  . Accu-Chek Softclix Lancets lancets 1 each by Other route 2 (two) times daily. E11.9 200 each 0  . acetaminophen (TYLENOL) 325 MG tablet Take 325-650 mg by mouth every 6 (six) hours as needed for mild pain or headache.    . albuterol (PROAIR HFA) 108 (90 BASE) MCG/ACT inhaler Inhale 2 puffs into the lungs every 4 (four) hours as needed for wheezing or shortness of breath.    Marland Kitchen  Alcohol Swabs (B-D SINGLE USE SWABS REGULAR) PADS 1 each by Does not apply route 2 (two) times daily. Cleanse site BID for glucose testing; E11.9 200 each 0  . amLODipine (NORVASC) 5 MG tablet Take 1 tablet (5 mg total) by mouth daily. 30 tablet 0  . aspirin EC 81 MG tablet Take 81 mg by mouth every morning.    . Blood Glucose Calibration (ACCU-CHEK AVIVA) SOLN 1 each by Other route as needed (Use prn to calibrate glucometer). E11.9 1 each 0  . Blood Glucose Monitoring Suppl (ACCU-CHEK AVIVA PLUS) w/Device KIT USE AS DIRECTED 1 kit 0  . busPIRone (BUSPAR) 5 MG tablet Take 5 mg by mouth at bedtime.     . Calcium Carbonate-Vitamin D 600-400 MG-UNIT per tablet Take 1 tablet by mouth daily.    . diclofenac Sodium (VOLTAREN) 1 % GEL Apply 4 g topically 4 (four) times daily as needed for muscle pain.    Marland Kitchen diltiazem (CARDIZEM CD) 180 MG  24 hr capsule Take 180 mg by mouth daily.    Marland Kitchen esomeprazole (NEXIUM) 20 MG capsule Take 20 mg by mouth every morning.    . gabapentin (NEURONTIN) 300 MG capsule Take 300 mg by mouth at bedtime.   5  . glucose blood (ACCU-CHEK AVIVA) test strip 1 each by Other route 2 (two) times daily. E11.9 200 each 0  . HUMIRA PEN 40 MG/0.4ML PNKT Inject 0.8 mLs into the skin every Wednesday.    . memantine (NAMENDA) 10 MG tablet Take 10 mg by mouth at bedtime.     . metFORMIN (GLUCOPHAGE) 1000 MG tablet Take 1 tablet (1,000 mg total) by mouth daily with breakfast. 90 tablet 3  . methocarbamol (ROBAXIN) 500 MG tablet Take 500 mg by mouth at bedtime.     Marland Kitchen nystatin cream (MYCOSTATIN) Apply 1 application topically 2 (two) times daily as needed for dry skin (yeast infection on breast).     Marland Kitchen olmesartan (BENICAR) 40 MG tablet Take 40 mg by mouth daily.    . ondansetron (ZOFRAN ODT) 4 MG disintegrating tablet 103m ODT q4 hours prn nausea/vomit 10 tablet 0  . OZEMPIC, 1 MG/DOSE, 2 MG/1.5ML SOPN INJECT 1 MG INTO THE SKIN ONCE A WEEK. 6 pen 1  . PEG-KCl-NaCl-NaSulf-Na Asc-C (PLENVU) 140 g SOLR Take 140 g by mouth as directed. 1 each 0  . pravastatin (PRAVACHOL) 20 MG tablet Take 20 mg by mouth daily.    . primidone (MYSOLINE) 250 MG tablet Take 250 mg by mouth in the morning and at bedtime.     . sertraline (ZOLOFT) 50 MG tablet Take 75 mg by mouth at bedtime.      No current facility-administered medications on file prior to visit.    Allergies  Allergen Reactions  . Lisinopril Cough    Family History  Problem Relation Age of Onset  . Diabetes Other        maternal aunts and uncles  . Diabetes Father   . Liver disease Father   . Emphysema Father        smoked  . Diabetes Sister   . Diabetes Brother   . Sarcoidosis Brother   . Emphysema Mother        smoked  . Asthma Mother   . Asthma Brother   . Colon cancer Neg Hx   . Esophageal cancer Neg Hx   . Stomach cancer Neg Hx   . Rectal cancer Neg Hx      BP 132/84  Pulse 81   Ht 5' (1.524 m)   Wt 138 lb 9.6 oz (62.9 kg)   LMP  (LMP Unknown)   SpO2 97%   BMI 27.07 kg/m    Review of Systems     Objective:   Physical Exam VITAL SIGNS:  See vs page GENERAL: no distress Pulses: dorsalis pedis intact bilat.   MSK: no deformity of the feet CV: no leg edema Skin:  no ulcer on the feet.  normal color and temp on the feet.   Neuro: sensation is intact to touch on the feet Ext: there is bilateral onychomycosis of the toenails.    Lab Results  Component Value Date   CREATININE 0.89 03/01/2020   BUN 13 03/01/2020   NA 141 03/01/2020   K 4.5 03/01/2020   CL 107 03/01/2020   CO2 24 03/01/2020   A1c=6.2%     Assessment & Plan:  Type 2 DM: well-controlled  Patient Instructions  Please continue the same metformin and Ozempic check your blood sugar once a day.  vary the time of day when you check, between before the 3 meals, and at bedtime.  also check if you have symptoms of your blood sugar being too high or too low.  please keep a record of the readings and bring it to your next appointment here (or you can bring the meter itself).  You can write it on any piece of paper.  please call us sooner if your blood sugar goes below 70, or if you have a lot of readings over 200.   Please come back for a follow-up appointment in 6 months.

## 2020-10-11 ENCOUNTER — Other Ambulatory Visit: Payer: Self-pay | Admitting: Endocrinology

## 2021-02-25 ENCOUNTER — Other Ambulatory Visit: Payer: Self-pay | Admitting: Endocrinology

## 2021-02-27 ENCOUNTER — Encounter: Payer: Self-pay | Admitting: Emergency Medicine

## 2021-02-27 ENCOUNTER — Ambulatory Visit
Admission: EM | Admit: 2021-02-27 | Discharge: 2021-02-27 | Disposition: A | Discharge status: Home / Self Care | Payer: Medicare Other | Attending: Physician Assistant | Admitting: Physician Assistant

## 2021-02-27 ENCOUNTER — Other Ambulatory Visit: Payer: Self-pay

## 2021-02-27 DIAGNOSIS — S99922A Unspecified injury of left foot, initial encounter: Secondary | ICD-10-CM | POA: Diagnosis not present

## 2021-02-27 MED ORDER — CEPHALEXIN 250 MG PO CAPS: 250.0000 mg | ORAL_CAPSULE | Freq: Four times a day (QID) | ORAL | 0 refills | Status: AC

## 2021-02-27 NOTE — ED Provider Notes (Signed)
[facility]    CSN: [id number] Arrival date & time: 02/27/21  1935      History   Chief Complaint Chief Complaint  Patient presents with  . Wound Check    Right Foot (Big Toe )    HPI Carrie [name] is a 65 y.o. female.   Pt reports she had a pedicure earlier today and noticed irritation around her right great toenail, she reports the nail began to fall off and she was able to pull it off completely.  Pt is a diabetic.  She reports known toenail fungus to this toenail.  She reports continued pain to this toenail.  No bleeding, swelling, or drainage.      Past Medical History:  Diagnosis Date  . Anxiety   . Arthritis    psoriatic arithritis, DDD   . Asthma   . Depression   . Diabetes mellitus   . Gastritis   . Gastroparesis   . GERD (gastroesophageal reflux disease)   . Hernia   . Hyperlipemia   . Hyperlipemia   . Hypertension   . Neuropathy    Bil feet re: to Diabetes  . Obesity   . Osteoporosis   . Plantar fasciitis   . Stricture and stenosis of esophagus   . TIA (transient ischemic attack)    8-10 yrs ago, no problems since  . Tuberculosis    tested positive 2011, no symptoms, was on medicine for 6 months    Patient Active Problem List   Diagnosis Date Noted  . Bowel obstruction ([place]) 02/08/2020  . Leukocytosis 02/08/2020  . 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 Psoriasis 07/08/2017  . Diabetes ([place]) 02/16/2016  . Chest pain 12/04/2013  . Diabetes mellitus type II, uncontrolled ([place]) 12/04/2013  . HTN (hypertension) 12/04/2013  . Hypoglycemia 12/04/2013  . Pyelonephritis 10/10/2013  . UTI (lower urinary tract infection) 10/10/2013  . HBP (high blood pressure) 04/21/2013  . Gastroparesis 02/25/2013  . Cough 02/25/2013  . Nausea and vomiting 01/06/2013  . Nausea alone 05/15/2011  . STRICTURE AND STENOSIS OF ESOPHAGUS 10/21/2009  .  Hyperlipidemia 01/09/2008  . OBESITY 01/09/2008  . Asthma 01/09/2008  . GERD 01/09/2008  . HERNIA, VENTRAL 01/09/2008  . ARTHRITIS 01/09/2008  . Depression 03/25/2007  . ANXIETY 03/25/2007    Past Surgical History:  Procedure Laterality Date  . BREAST SURGERY Bilateral    biopsies both breast  . CARDIAC CATHETERIZATION    . CESAREAN SECTION     x 2   . COLONOSCOPY    . JOINT REPLACEMENT     bilat knee   . KNEE SURGERY Bilateral    x 2 joint knee replacements  . UPPER GASTROINTESTINAL ENDOSCOPY    . WISDOM TOOTH EXTRACTION      OB History   No obstetric history on file.      Home Medications    Prior to Admission medications   Medication Sig Start Date End Date Taking? Authorizing Provider  cephALEXin (KEFLEX) 250 MG capsule Take 1 capsule (250 mg total) by mouth 4 (four) times daily for 7 days. 02/27/21 03/06/21 Yes Tiburcio Linder, PA-C  Accu-Chek Softclix Lancets lancets 1 each by Other route 2 (two) times daily. E11.9 04/05/20   Renato Shin, MD  acetaminophen (TYLENOL) 325 MG tablet Take 325-650 mg by mouth every 6 (six) hours as needed for mild pain or headache.    [provider]  albuterol (PROAIR HFA) 108 (90 BASE) MCG/ACT inhaler Inhale 2 puffs into the lungs every 4 (four) hours as needed for wheezing or shortness of breath.    [provider]  Alcohol Swabs (B-D SINGLE USE SWABS REGULAR) PADS 1 each by Does not apply route 2 (two) times daily. Cleanse site BID for glucose testing; E11.9 04/08/20   Renato Shin, MD  amLODipine (NORVASC) 5 MG tablet Take 1 tablet (5 mg total) by mouth daily. 09/30/15   Dhungel, Flonnie Overman, MD  aspirin EC 81 MG tablet Take 81 mg by mouth every morning.    [provider]  Blood Glucose Calibration (ACCU-CHEK AVIVA) SOLN 1 each by Other route as needed (Use prn to calibrate glucometer). E11.9 04/05/20   Renato Shin, MD  Blood Glucose Monitoring Suppl (ACCU-CHEK AVIVA PLUS) w/Device KIT USE AS DIRECTED 04/03/20    Renato Shin, MD  busPIRone (BUSPAR) 5 MG tablet Take 5 mg by mouth at bedtime.     [provider]  Calcium Carbonate-Vitamin D 600-400 MG-UNIT per tablet Take 1 tablet by mouth daily. 04/26/15   [provider]  diclofenac Sodium (VOLTAREN) 1 % GEL Apply 4 g topically 4 (four) times daily as needed for muscle pain. 01/14/20   [provider]  diltiazem (CARDIZEM CD) 180 MG 24 hr capsule Take 180 mg by mouth daily. 01/19/20   [provider]  esomeprazole (NEXIUM) 20 MG capsule Take 20 mg by mouth every morning. 01/18/20   [provider]  gabapentin (NEURONTIN) 300 MG capsule Take 300 mg by mouth at bedtime.  04/26/15   [provider]  glucose blood (ACCU-CHEK AVIVA) test strip 1 each by Other route 2 (two) times daily. E11.9 04/05/20   Renato Shin, MD  HUMIRA PEN 40 MG/0.4ML PNKT Inject 0.8 mLs into the skin every Wednesday. 01/01/19   [provider]  memantine (NAMENDA) 10 MG tablet Take 10 mg by mouth at bedtime.     [provider]  metFORMIN (GLUCOPHAGE) 1000 MG tablet Take 1 tablet (1,000 mg total) by mouth daily with breakfast. 05/05/20   Renato Shin, MD  methocarbamol (ROBAXIN) 500 MG tablet Take 500 mg by mouth at bedtime.  10/20/18   [provider]  nystatin cream (MYCOSTATIN) Apply 1 application topically 2 (two) times daily as needed for dry skin (yeast infection on breast).  12/17/18   [provider]  olmesartan (BENICAR) 40 MG tablet Take 40 mg by mouth daily. 01/26/20   [provider]  ondansetron (ZOFRAN ODT) 4 MG disintegrating tablet 83m ODT q4 hours prn nausea/vomit 03/01/20   YDrenda Freeze MD  OZEMPIC, 1 MG/DOSE, 4 MG/3ML SOPN INJECT 1 MG INTO THE SKIN ONCE A WEEK. 02/26/21   ERenato Shin MD  PEG-KCl-NaCl-NaSulf-Na Asc-C (PLENVU) 140 g SOLR Take 140 g by mouth as directed. 08/15/18   DDoran Stabler MD  pravastatin (PRAVACHOL) 20 MG tablet Take 20 mg by mouth daily.     [provider]  primidone (MYSOLINE) 250 MG tablet Take 250 mg by mouth in the morning and at bedtime.     [provider]  sertraline (ZOLOFT) 50 MG tablet Take 75 mg by mouth at bedtime.     [provider]    Family History Family History  Problem Relation Age of Onset  . Diabetes Other        maternal aunts and uncles  . Diabetes Father   . Liver disease Father   . Emphysema Father  smoked  . Diabetes Sister   . Diabetes Brother   . Sarcoidosis Brother   . Emphysema Mother        smoked  . Asthma Mother   . Asthma Brother   . Colon cancer Neg Hx   . Esophageal cancer Neg Hx   . Stomach cancer Neg Hx   . Rectal cancer Neg Hx     Social History Social History   Tobacco Use  . Smoking status: Never Smoker  . Smokeless tobacco: Never Used  Vaping Use  . Vaping Use: Never used  Substance Use Topics  . Alcohol use: Not Currently    Comment: socially  . Drug use: No     Allergies   Lisinopril   Review of Systems Review of Systems  Constitutional: Negative for chills and fever.  HENT: Negative for ear pain and sore throat.   Eyes: Negative for pain and visual disturbance.  Respiratory: Negative for cough and shortness of breath.   Cardiovascular: Negative for chest pain and palpitations.  Gastrointestinal: Negative for abdominal pain and vomiting.  Genitourinary: Negative for dysuria and hematuria.  Musculoskeletal: Negative for arthralgias and back pain.  Skin: Negative for color change and rash.       Right toenail removed Right toe pain  Neurological: Negative for seizures and syncope.  All other systems reviewed and are negative.    Physical Exam Triage Vital Signs ED Triage Vitals [02/27/21 2013]  Enc Vitals Group     BP (!) 186/106     Pulse Rate 91     Resp 18     Temp 99 F (37.2 C)     Temp Source Oral     SpO2 98 %     Weight      Height      Head Circumference      Peak Flow      Pain Score 5      Pain Loc      Pain Edu?      Excl. in [place]?    No data found.  Updated Vital Signs BP (!) 186/106 (BP Location: Left Arm)   Pulse 91   Temp 99 F (37.2 C) (Oral)   Resp 18   LMP  (LMP Unknown)   SpO2 98%   Visual Acuity Right Eye Distance:   Left Eye Distance:   Bilateral Distance:    Right Eye Near:   Left Eye Near:    Bilateral Near:     Physical Exam Vitals and nursing note reviewed.  Constitutional:      General: She is not in acute distress.    Appearance: She is well-developed.  HENT:     Head: Normocephalic and atraumatic.  Eyes:     Conjunctiva/sclera: Conjunctivae normal.  Cardiovascular:     Rate and Rhythm: Normal rate and regular rhythm.     Heart sounds: No murmur heard.   Pulmonary:     Effort: Pulmonary effort is normal. No respiratory distress.     Breath sounds: Normal breath sounds.  Abdominal:     Palpations: Abdomen is soft.     Tenderness: There is no abdominal tenderness.  Musculoskeletal:     Cervical back: Neck supple.       Feet:  Skin:    General: Skin is warm and dry.  Neurological:     Mental Status: She is alert.      UC Treatments / Results  Labs (all labs ordered are listed, but only  abnormal results are displayed) Labs Reviewed - No data to display  EKG   Radiology No results found.  Procedures Procedures (including critical care time)  Medications Ordered in UC Medications - No data to display  Initial Impression / Assessment and Plan / UC Course  I have reviewed the triage vital signs and the nursing notes.  Pertinent labs & imaging results that were available during my care of the patient were reviewed by me and considered in my medical decision making (see chart for details).     Will cover with antibiotics. Xeroform applied, gauze, and coban.  Advised to follow up with podiatry. Discussed signs of infection.  She will return here or ED if worsening sx before she can be seen by podiatry or PCP.  Final  Clinical Impressions(s) / UC Diagnoses   Final diagnoses:  Injury of toenail of left foot, initial encounter     Discharge Instructions     Take medication as prescribed Follow up with podiatry   ED Prescriptions    Medication Sig Dispense Auth. Provider   cephALEXin (KEFLEX) 250 MG capsule Take 1 capsule (250 mg total) by mouth 4 (four) times daily for 7 days. 28 capsule Konrad Felix, PA-C     PDMP not reviewed this encounter.   Konrad Felix, PA-C 03/02/21 1458

## 2021-02-27 NOTE — ED Triage Notes (Signed)
Pt is present today with a right big toe infection. Pt states that she went to have a pedicure done today and noticed that her toe nail came off and bleeding. Pt states that she does have fungus on her toe and it is diabetic

## 2021-02-27 NOTE — Discharge Instructions (Signed)
Take medication as prescribed Follow up with podiatry

## 2021-03-16 ENCOUNTER — Other Ambulatory Visit: Payer: Self-pay

## 2021-03-16 ENCOUNTER — Ambulatory Visit (INDEPENDENT_AMBULATORY_CARE_PROVIDER_SITE_OTHER): Payer: Medicare Other | Admitting: Endocrinology

## 2021-03-16 VITALS — BP 178/90 | HR 80 | Ht 60.0 in | Wt 146.4 lb

## 2021-03-16 DIAGNOSIS — E1142 Type 2 diabetes mellitus with diabetic polyneuropathy: Secondary | ICD-10-CM | POA: Diagnosis not present

## 2021-03-16 DIAGNOSIS — Z794 Long term (current) use of insulin: Secondary | ICD-10-CM

## 2021-03-16 LAB — POCT GLYCOSYLATED HEMOGLOBIN (HGB A1C): Hemoglobin A1C: 6 % — AB (ref 4.0–5.6)

## 2021-03-16 MED ORDER — ACCU-CHEK AVIVA PLUS W/DEVICE KIT: 1.0000 | PACK | Freq: Once | 0 refills | Status: DC

## 2021-03-16 NOTE — Patient Instructions (Addendum)
Your blood pressure is high today.  Please see your primary care provider soon, to have it rechecked Please continue the same metformin and Ozempic check your blood sugar once a day.  vary the time of day when you check, between before the 3 meals, and at bedtime.  also check if you have symptoms of your blood sugar being too high or too low.  please keep a record of the readings and bring it to your next appointment here (or you can bring the meter itself).  You can write it on any piece of paper.  please call us sooner if your blood sugar goes below 70, or if you have a lot of readings over 200.   Please come back for a follow-up appointment in 6 months.

## 2021-03-16 NOTE — Progress Notes (Signed)
Subjective:    Patient ID: Carrie [name], female    DOB: 11/18/1955, 65 y.o.   MRN: [id number]  HPI Pt returns for f/u of diabetes mellitus: DM type: 2 Dx'ed: 2010. 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   Therapy: metformin and Ozempic.   GDM: 2000.  DKA: never. Severe hypoglycemia: never.  Pancreatitis: never.  SDOH: she was on multiple daily injections, but could not remember to take.  Other: she did not tolerate invokana (vaginitis); psoriasis raises possibility she is evolving type 1 DM; edema limits rx options.  She took insulin 2011-2121.  Interval history: pt requests a new meter.  She says she never misses the medications. No recent steroids.  Past Medical History:  Diagnosis Date  . Anxiety   . Arthritis    psoriatic arithritis, DDD   . Asthma   . Depression   . Diabetes mellitus   . Gastritis   . Gastroparesis   . GERD (gastroesophageal reflux disease)   . Hernia   . Hyperlipemia   . Hyperlipemia   . Hypertension   . Neuropathy    Bil feet re: to Diabetes  . Obesity   . Osteoporosis   . Plantar fasciitis   . Stricture and stenosis of esophagus   . TIA (transient ischemic attack)    8-10 yrs ago, no problems since  . Tuberculosis    tested positive 2011, no symptoms, was on medicine for 6 months    Past Surgical History:  Procedure Laterality Date  . BREAST SURGERY Bilateral    biopsies both breast  . CARDIAC CATHETERIZATION    . CESAREAN SECTION     x 2   . COLONOSCOPY    . JOINT REPLACEMENT     bilat knee   . KNEE SURGERY Bilateral    x 2 joint knee replacements  . UPPER GASTROINTESTINAL ENDOSCOPY    . WISDOM TOOTH EXTRACTION      Social History   Socioeconomic History  . Marital status: Single    Spouse name: Not on file  . Number of children: 2  . Years of education: Not on file  . Highest education level: Not on file  Occupational History  . Occupation: Disabled   Tobacco Use  . Smoking status: Never Smoker  . Smokeless tobacco: Never  Used  Vaping Use  . Vaping Use: Never used  Substance and Sexual Activity  . Alcohol use: Not Currently    Comment: socially  . Drug use: No  . Sexual activity: Not Currently    Birth control/protection: Post-menopausal  Other Topics Concern  . Not on file  Social History Narrative   [place] daily    Social Determinants of Health   Financial Resource Strain: Not on file  Food Insecurity: Not on file  Transportation Needs: Not on file  Physical Activity: Not on file  Stress: Not on file  Social Connections: Not on file  Intimate Partner Violence: Not on file    Current Outpatient Medications on File Prior to Visit  Medication Sig Dispense Refill  . Accu-Chek Softclix Lancets lancets 1 each by Other route 2 (two) times daily. E11.9 200 each 0  . acetaminophen (TYLENOL) 325 MG tablet Take 325-650 mg by mouth every 6 (six) hours as needed for mild pain or headache.    . albuterol (VENTOLIN HFA) 108 (90 Base) MCG/ACT inhaler Inhale 2 puffs into the lungs every 4 (four) hours as needed for wheezing or shortness of breath.    . Alcohol  Swabs (B-D SINGLE USE SWABS REGULAR) PADS 1 each by Does not apply route 2 (two) times daily. Cleanse site BID for glucose testing; E11.9 200 each 0  . amLODipine (NORVASC) 5 MG tablet Take 1 tablet (5 mg total) by mouth daily. 30 tablet 0  . aspirin EC 81 MG tablet Take 81 mg by mouth every morning.    . busPIRone (BUSPAR) 5 MG tablet Take 5 mg by mouth at bedtime.     . Calcium Carbonate-Vitamin D 600-400 MG-UNIT per tablet Take 1 tablet by mouth daily.    . diclofenac Sodium (VOLTAREN) 1 % GEL Apply 4 g topically 4 (four) times daily as needed for muscle pain.    Marland Kitchen diltiazem (CARDIZEM CD) 180 MG 24 hr capsule Take 180 mg by mouth daily.    Marland Kitchen esomeprazole (NEXIUM) 20 MG capsule Take 20 mg by mouth every morning.    . gabapentin (NEURONTIN) 300 MG capsule Take 300 mg by mouth at bedtime.   5  . glucose blood (ACCU-CHEK AVIVA) test strip 1 each by Other  route 2 (two) times daily. E11.9 200 each 0  . HUMIRA PEN 40 MG/0.4ML PNKT Inject 0.8 mLs into the skin every Wednesday.    . memantine (NAMENDA) 10 MG tablet Take 10 mg by mouth at bedtime.     . metFORMIN (GLUCOPHAGE) 1000 MG tablet Take 1 tablet (1,000 mg total) by mouth daily with breakfast. 90 tablet 3  . methocarbamol (ROBAXIN) 500 MG tablet Take 500 mg by mouth at bedtime.     Marland Kitchen nystatin cream (MYCOSTATIN) Apply 1 application topically 2 (two) times daily as needed for dry skin (yeast infection on breast).     Marland Kitchen olmesartan (BENICAR) 40 MG tablet Take 40 mg by mouth daily.    . ondansetron (ZOFRAN ODT) 4 MG disintegrating tablet 26m ODT q4 hours prn nausea/vomit 10 tablet 0  . OZEMPIC, 1 MG/DOSE, 4 MG/3ML SOPN INJECT 1 MG INTO THE SKIN ONCE A WEEK. 9 mL 0  . PEG-KCl-NaCl-NaSulf-Na Asc-C (PLENVU) 140 g SOLR Take 140 g by mouth as directed. 1 each 0  . pravastatin (PRAVACHOL) 20 MG tablet Take 20 mg by mouth daily.    . primidone (MYSOLINE) 250 MG tablet Take 250 mg by mouth in the morning and at bedtime.     . sertraline (ZOLOFT) 50 MG tablet Take 75 mg by mouth at bedtime.      No current facility-administered medications on file prior to visit.    Allergies  Allergen Reactions  . Lisinopril Cough    Family History  Problem Relation Age of Onset  . Diabetes Other        maternal aunts and uncles  . Diabetes Father   . Liver disease Father   . Emphysema Father        smoked  . Diabetes Sister   . Diabetes Brother   . Sarcoidosis Brother   . Emphysema Mother        smoked  . Asthma Mother   . Asthma Brother   . Colon cancer Neg Hx   . Esophageal cancer Neg Hx   . Stomach cancer Neg Hx   . Rectal cancer Neg Hx     BP (!) 178/90 (BP Location: Right Arm, Patient Position: Sitting, Cuff Size: Normal)   Pulse 80   Ht 5' (1.524 m)   Wt 146 lb 6.4 oz (66.4 kg)   LMP  (LMP Unknown)   SpO2 97%   BMI 28.59 kg/m  Review of Systems Denies n/v/heartburn.       Objective:   Physical Exam VITAL SIGNS:  See vs page GENERAL: no distress Pulses: dorsalis pedis intact bilat.   MSK: no deformity of the feet CV: no leg edema Skin:  no ulcer on the feet.  normal color and temp on the feet. Neuro: sensation is intact to touch on the feet Ext: bandaid on right great toe (sees podiatry)   Lab Results  Component Value Date   HGBA1C 6.0 (A) 03/16/2021      Assessment & Plan:  Type 2 DM: well-controlled.  I gave pt a new meter, and sent rx for strips.   Patient Instructions  Your blood pressure is high today.  Please see your primary care provider soon, to have it rechecked Please continue the same metformin and Ozempic check your blood sugar once a day.  vary the time of day when you check, between before the 3 meals, and at bedtime.  also check if you have symptoms of your blood sugar being too high or too low.  please keep a record of the readings and bring it to your next appointment here (or you can bring the meter itself).  You can write it on any piece of paper.  please call us sooner if your blood sugar goes below 70, or if you have a lot of readings over 200.   Please come back for a follow-up appointment in 6 months.

## 2021-03-21 ENCOUNTER — Telehealth: Payer: Self-pay | Admitting: Endocrinology

## 2021-03-21 DIAGNOSIS — E1142 Type 2 diabetes mellitus with diabetic polyneuropathy: Secondary | ICD-10-CM

## 2021-03-21 NOTE — Telephone Encounter (Signed)
Patient called re: [facility] told Patient that they are unable to get the Accu-Chek Aviva Plus and they told Patient to request new RX's for Accu-Chek Guide Meter and all supplies for Meter [facility]  [id number] [place]  [phone] Fax:  [phone]

## 2021-03-23 MED ORDER — ACCU-CHEK SOFTCLIX LANCETS MISC: 0 refills | Status: DC

## 2021-03-23 MED ORDER — ACCU-CHEK GUIDE ME W/DEVICE KIT: PACK | 0 refills | Status: DC

## 2021-03-23 MED ORDER — ACCU-CHEK GUIDE VI STRP: ORAL_STRIP | 5 refills | Status: DC

## 2021-03-23 NOTE — Addendum Note (Signed)
Addended by: Jacqualin Combes on: 03/23/2021 10:09 AM   Modules accepted: Orders

## 2021-03-23 NOTE — Telephone Encounter (Signed)
Accu-Chek Guide meter and supplies have [facility] to [facility]

## 2021-04-10 ENCOUNTER — Other Ambulatory Visit: Payer: Self-pay

## 2021-04-10 ENCOUNTER — Emergency Department (HOSPITAL_COMMUNITY): Payer: Medicare Other

## 2021-04-10 ENCOUNTER — Inpatient Hospital Stay (HOSPITAL_COMMUNITY)
Admission: EM | Admit: 2021-04-10 | Discharge: 2021-04-19 | DRG: 871 | Disposition: A | Discharge status: Home Health | Payer: Medicare Other | Attending: Internal Medicine | Admitting: Internal Medicine

## 2021-04-10 ENCOUNTER — Encounter (HOSPITAL_COMMUNITY): Payer: Self-pay

## 2021-04-10 DIAGNOSIS — I10 Essential (primary) hypertension: Secondary | ICD-10-CM | POA: Diagnosis present

## 2021-04-10 DIAGNOSIS — D696 Thrombocytopenia, unspecified: Secondary | ICD-10-CM | POA: Diagnosis present

## 2021-04-10 DIAGNOSIS — Y92410 Unspecified street and highway as the place of occurrence of the external cause: Secondary | ICD-10-CM | POA: Diagnosis not present

## 2021-04-10 DIAGNOSIS — E1065 Type 1 diabetes mellitus with hyperglycemia: Secondary | ICD-10-CM | POA: Diagnosis not present

## 2021-04-10 DIAGNOSIS — N12 Tubulo-interstitial nephritis, not specified as acute or chronic: Secondary | ICD-10-CM | POA: Diagnosis present

## 2021-04-10 DIAGNOSIS — K717 Toxic liver disease with fibrosis and cirrhosis of liver: Secondary | ICD-10-CM | POA: Diagnosis not present

## 2021-04-10 DIAGNOSIS — E872 Acidosis: Secondary | ICD-10-CM | POA: Diagnosis present

## 2021-04-10 DIAGNOSIS — R7989 Other specified abnormal findings of blood chemistry: Secondary | ICD-10-CM | POA: Diagnosis present

## 2021-04-10 DIAGNOSIS — D638 Anemia in other chronic diseases classified elsewhere: Secondary | ICD-10-CM | POA: Diagnosis present

## 2021-04-10 DIAGNOSIS — G250 Essential tremor: Secondary | ICD-10-CM | POA: Diagnosis present

## 2021-04-10 DIAGNOSIS — L4050 Arthropathic psoriasis, unspecified: Secondary | ICD-10-CM | POA: Diagnosis present

## 2021-04-10 DIAGNOSIS — E876 Hypokalemia: Secondary | ICD-10-CM | POA: Diagnosis present

## 2021-04-10 DIAGNOSIS — Z79899 Other long term (current) drug therapy: Secondary | ICD-10-CM

## 2021-04-10 DIAGNOSIS — K7460 Unspecified cirrhosis of liver: Secondary | ICD-10-CM | POA: Diagnosis present

## 2021-04-10 DIAGNOSIS — N10 Acute tubulo-interstitial nephritis: Secondary | ICD-10-CM | POA: Diagnosis present

## 2021-04-10 DIAGNOSIS — E1365 Other specified diabetes mellitus with hyperglycemia: Secondary | ICD-10-CM | POA: Diagnosis not present

## 2021-04-10 DIAGNOSIS — Z96653 Presence of artificial knee joint, bilateral: Secondary | ICD-10-CM | POA: Diagnosis present

## 2021-04-10 DIAGNOSIS — E119 Type 2 diabetes mellitus without complications: Secondary | ICD-10-CM | POA: Diagnosis not present

## 2021-04-10 DIAGNOSIS — Z833 Family history of diabetes mellitus: Secondary | ICD-10-CM

## 2021-04-10 DIAGNOSIS — E1142 Type 2 diabetes mellitus with diabetic polyneuropathy: Secondary | ICD-10-CM | POA: Diagnosis present

## 2021-04-10 DIAGNOSIS — G8929 Other chronic pain: Secondary | ICD-10-CM | POA: Diagnosis present

## 2021-04-10 DIAGNOSIS — A419 Sepsis, unspecified organism: Principal | ICD-10-CM

## 2021-04-10 DIAGNOSIS — E871 Hypo-osmolality and hyponatremia: Secondary | ICD-10-CM | POA: Diagnosis present

## 2021-04-10 DIAGNOSIS — J45909 Unspecified asthma, uncomplicated: Secondary | ICD-10-CM | POA: Diagnosis present

## 2021-04-10 DIAGNOSIS — D72829 Elevated white blood cell count, unspecified: Secondary | ICD-10-CM | POA: Diagnosis not present

## 2021-04-10 DIAGNOSIS — R627 Adult failure to thrive: Secondary | ICD-10-CM | POA: Diagnosis not present

## 2021-04-10 DIAGNOSIS — Z87448 Personal history of other diseases of urinary system: Secondary | ICD-10-CM | POA: Diagnosis not present

## 2021-04-10 DIAGNOSIS — F32A Depression, unspecified: Secondary | ICD-10-CM | POA: Diagnosis present

## 2021-04-10 DIAGNOSIS — I959 Hypotension, unspecified: Secondary | ICD-10-CM

## 2021-04-10 DIAGNOSIS — F419 Anxiety disorder, unspecified: Secondary | ICD-10-CM | POA: Diagnosis present

## 2021-04-10 DIAGNOSIS — Z825 Family history of asthma and other chronic lower respiratory diseases: Secondary | ICD-10-CM

## 2021-04-10 DIAGNOSIS — L8990 Pressure ulcer of unspecified site, unspecified stage: Secondary | ICD-10-CM | POA: Insufficient documentation

## 2021-04-10 DIAGNOSIS — G928 Other toxic encephalopathy: Secondary | ICD-10-CM | POA: Diagnosis present

## 2021-04-10 DIAGNOSIS — L89152 Pressure ulcer of sacral region, stage 2: Secondary | ICD-10-CM | POA: Diagnosis present

## 2021-04-10 DIAGNOSIS — T8454XA 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, initial encounter: Secondary | ICD-10-CM | POA: Diagnosis not present

## 2021-04-10 DIAGNOSIS — K7581 Nonalcoholic steatohepatitis (NASH): Secondary | ICD-10-CM | POA: Diagnosis not present

## 2021-04-10 DIAGNOSIS — N170 Acute kidney failure with tubular necrosis: Secondary | ICD-10-CM | POA: Diagnosis present

## 2021-04-10 DIAGNOSIS — Z8616 Personal history of COVID-19: Secondary | ICD-10-CM | POA: Diagnosis not present

## 2021-04-10 DIAGNOSIS — Z20822 Contact with and (suspected) exposure to covid-19: Secondary | ICD-10-CM | POA: Diagnosis present

## 2021-04-10 DIAGNOSIS — B958 Unspecified staphylococcus as the cause of diseases classified elsewhere: Secondary | ICD-10-CM | POA: Diagnosis not present

## 2021-04-10 DIAGNOSIS — Z8673 Personal history of transient ischemic attack (TIA), and cerebral infarction without residual deficits: Secondary | ICD-10-CM

## 2021-04-10 DIAGNOSIS — N179 Acute kidney failure, unspecified: Secondary | ICD-10-CM | POA: Diagnosis not present

## 2021-04-10 DIAGNOSIS — L409 Psoriasis, unspecified: Secondary | ICD-10-CM | POA: Diagnosis present

## 2021-04-10 DIAGNOSIS — Z7982 Long term (current) use of aspirin: Secondary | ICD-10-CM

## 2021-04-10 DIAGNOSIS — E869 Volume depletion, unspecified: Secondary | ICD-10-CM | POA: Diagnosis present

## 2021-04-10 DIAGNOSIS — R319 Hematuria, unspecified: Secondary | ICD-10-CM

## 2021-04-10 DIAGNOSIS — Z794 Long term (current) use of insulin: Secondary | ICD-10-CM | POA: Diagnosis not present

## 2021-04-10 DIAGNOSIS — Z7984 Long term (current) use of oral hypoglycemic drugs: Secondary | ICD-10-CM

## 2021-04-10 DIAGNOSIS — G9341 Metabolic encephalopathy: Secondary | ICD-10-CM | POA: Diagnosis not present

## 2021-04-10 DIAGNOSIS — N390 Urinary tract infection, site not specified: Secondary | ICD-10-CM

## 2021-04-10 DIAGNOSIS — E785 Hyperlipidemia, unspecified: Secondary | ICD-10-CM | POA: Diagnosis present

## 2021-04-10 LAB — COMPREHENSIVE METABOLIC PANEL
ALT: 43 U/L (ref 0–44)
AST: 101 U/L — ABNORMAL HIGH (ref 15–41)
Albumin: 3.3 g/dL — ABNORMAL LOW (ref 3.5–5.0)
Alkaline Phosphatase: 83 U/L (ref 38–126)
Anion gap: 18 — ABNORMAL HIGH (ref 5–15)
BUN: 52 mg/dL — ABNORMAL HIGH (ref 8–23)
CO2: 20 mmol/L — ABNORMAL LOW (ref 22–32)
Calcium: 8.3 mg/dL — ABNORMAL LOW (ref 8.9–10.3)
Chloride: 93 mmol/L — ABNORMAL LOW (ref 98–111)
Creatinine, Ser: 4.76 mg/dL — ABNORMAL HIGH (ref 0.44–1.00)
GFR, Estimated: 10 mL/min — ABNORMAL LOW (ref >60)
Glucose, Bld: 265 mg/dL — ABNORMAL HIGH (ref 70–99)
Potassium: 3.2 mmol/L — ABNORMAL LOW (ref 3.5–5.1)
Sodium: 131 mmol/L — ABNORMAL LOW (ref 135–145)
Total Bilirubin: 1.3 mg/dL — ABNORMAL HIGH (ref 0.3–1.2)
Total Protein: 7.9 g/dL (ref 6.5–8.1)

## 2021-04-10 LAB — URINALYSIS, ROUTINE W REFLEX MICROSCOPIC
Bilirubin Urine: NEGATIVE
Glucose, UA: NEGATIVE mg/dL
Ketones, ur: NEGATIVE mg/dL
Nitrite: NEGATIVE
Protein, ur: 100 mg/dL — AB
Specific Gravity, Urine: 1.015 (ref 1.005–1.030)
WBC, UA: >50 WBC/hpf — ABNORMAL HIGH (ref 0–5)
pH: 5 (ref 5.0–8.0)

## 2021-04-10 LAB — CBC WITH DIFFERENTIAL/PLATELET
Abs Immature Granulocytes: 0.17 10*3/uL — ABNORMAL HIGH (ref 0.00–0.07)
Basophils Absolute: 0 10*3/uL (ref 0.0–0.1)
Basophils Relative: 0 %
Eosinophils Absolute: 0 10*3/uL (ref 0.0–0.5)
Eosinophils Relative: 0 %
HCT: 34.2 % — ABNORMAL LOW (ref 36.0–46.0)
Hemoglobin: 11.5 g/dL — ABNORMAL LOW (ref 12.0–15.0)
Immature Granulocytes: 1 %
Lymphocytes Relative: 9 %
Lymphs Abs: 1.1 10*3/uL (ref 0.7–4.0)
MCH: 29.9 pg (ref 26.0–34.0)
MCHC: 33.6 g/dL (ref 30.0–36.0)
MCV: 88.8 fL (ref 80.0–100.0)
Monocytes Absolute: 0.9 10*3/uL (ref 0.1–1.0)
Monocytes Relative: 7 %
Neutro Abs: 10.5 10*3/uL — ABNORMAL HIGH (ref 1.7–7.7)
Neutrophils Relative %: 83 %
Platelets: 144 10*3/uL — ABNORMAL LOW (ref 150–400)
RBC: 3.85 MIL/uL — ABNORMAL LOW (ref 3.87–5.11)
RDW: 14.3 % (ref 11.5–15.5)
WBC: 12.7 10*3/uL — ABNORMAL HIGH (ref 4.0–10.5)
nRBC: 0 % (ref 0.0–0.2)

## 2021-04-10 LAB — RESP PANEL BY RT-PCR (FLU A&B, COVID) ARPGX2
Influenza A by PCR: NEGATIVE
Influenza B by PCR: NEGATIVE
SARS Coronavirus 2 by RT PCR: NEGATIVE

## 2021-04-10 LAB — GLUCOSE, CAPILLARY
Glucose-Capillary: 122 mg/dL — ABNORMAL HIGH (ref 70–99)
Glucose-Capillary: 162 mg/dL — ABNORMAL HIGH (ref 70–99)

## 2021-04-10 LAB — MAGNESIUM: Magnesium: 1.6 mg/dL — ABNORMAL LOW (ref 1.7–2.4)

## 2021-04-10 LAB — LACTIC ACID, PLASMA: Lactic Acid, Venous: 1.8 mmol/L (ref 0.5–1.9)

## 2021-04-10 LAB — TROPONIN I (HIGH SENSITIVITY): Troponin I (High Sensitivity): 6 ng/L (ref <18)

## 2021-04-10 LAB — APTT: aPTT: 42 seconds — ABNORMAL HIGH (ref 24–36)

## 2021-04-10 LAB — PROTIME-INR
INR: 1.1 (ref 0.8–1.2)
Prothrombin Time: 13.9 seconds (ref 11.4–15.2)

## 2021-04-10 MED ORDER — LACTATED RINGERS IV BOLUS
1000.0000 mL | Freq: Once | INTRAVENOUS | Status: AC
  Administered 2021-04-10: 1000 mL via INTRAVENOUS

## 2021-04-10 MED ORDER — INSULIN ASPART 100 UNIT/ML IJ SOLN: 0.0000 [IU] | Freq: Every day | INTRAMUSCULAR | Status: DC

## 2021-04-10 MED ORDER — ONDANSETRON HCL 4 MG/2ML IJ SOLN
4.0000 mg | Freq: Four times a day (QID) | INTRAMUSCULAR | Status: DC | PRN
  Administered 2021-04-10 – 2021-04-11 (×2): 4 mg via INTRAVENOUS
  Filled 2021-04-10 (×2): qty 2

## 2021-04-10 MED ORDER — ONDANSETRON HCL 4 MG PO TABS
4.0000 mg | ORAL_TABLET | Freq: Four times a day (QID) | ORAL | Status: DC | PRN
Start: 2021-04-10 — End: 2021-04-19

## 2021-04-10 MED ORDER — BUDESONIDE 0.25 MG/2ML IN SUSP
0.2500 mg | Freq: Two times a day (BID) | RESPIRATORY_TRACT | Status: DC
  Administered 2021-04-10 – 2021-04-12 (×4): 0.25 mg via RESPIRATORY_TRACT
  Filled 2021-04-10 (×8): qty 2

## 2021-04-10 MED ORDER — SODIUM CHLORIDE 0.9 % IV SOLN
1.0000 g | INTRAVENOUS | Status: DC
  Administered 2021-04-10 – 2021-04-11 (×2): 1 g via INTRAVENOUS
  Filled 2021-04-10 (×2): qty 1

## 2021-04-10 MED ORDER — SERTRALINE HCL 50 MG PO TABS
75.0000 mg | ORAL_TABLET | Freq: Every day | ORAL | Status: DC
  Administered 2021-04-10 – 2021-04-18 (×9): 75 mg via ORAL
  Filled 2021-04-10 (×3): qty 2
  Filled 2021-04-10: qty 1
  Filled 2021-04-10 (×3): qty 2
  Filled 2021-04-10: qty 1
  Filled 2021-04-10: qty 2
  Filled 2021-04-10: qty 1

## 2021-04-10 MED ORDER — POTASSIUM CHLORIDE CRYS ER 20 MEQ PO TBCR
40.0000 meq | EXTENDED_RELEASE_TABLET | Freq: Every day | ORAL | Status: DC
  Administered 2021-04-10 – 2021-04-16 (×7): 40 meq via ORAL
  Filled 2021-04-10 (×2): qty 2
  Filled 2021-04-10 (×2): qty 4
  Filled 2021-04-10: qty 2
  Filled 2021-04-10: qty 4
  Filled 2021-04-10: qty 2

## 2021-04-10 MED ORDER — VANCOMYCIN HCL IN DEXTROSE 1-5 GM/200ML-% IV SOLN
1000.0000 mg | Freq: Once | INTRAVENOUS | Status: AC
  Administered 2021-04-10: 1000 mg via INTRAVENOUS
  Filled 2021-04-10: qty 200

## 2021-04-10 MED ORDER — ACETAMINOPHEN 650 MG RE SUPP: 650.0000 mg | Freq: Four times a day (QID) | RECTAL | Status: DC | PRN

## 2021-04-10 MED ORDER — ASPIRIN EC 81 MG PO TBEC
81.0000 mg | DELAYED_RELEASE_TABLET | Freq: Every morning | ORAL | Status: DC
  Administered 2021-04-11 – 2021-04-19 (×9): 81 mg via ORAL
  Filled 2021-04-10 (×9): qty 1

## 2021-04-10 MED ORDER — PIPERACILLIN-TAZOBACTAM 3.375 G IVPB 30 MIN
3.3750 g | INTRAVENOUS | Status: AC
  Administered 2021-04-10: 3.375 g via INTRAVENOUS
  Filled 2021-04-10: qty 50

## 2021-04-10 MED ORDER — BUSPIRONE HCL 5 MG PO TABS
5.0000 mg | ORAL_TABLET | Freq: Every day | ORAL | Status: DC
  Administered 2021-04-10 – 2021-04-18 (×9): 5 mg via ORAL
  Filled 2021-04-10 (×9): qty 1

## 2021-04-10 MED ORDER — PRIMIDONE 250 MG PO TABS
250.0000 mg | ORAL_TABLET | ORAL | Status: DC
  Administered 2021-04-10 – 2021-04-19 (×18): 250 mg via ORAL
  Filled 2021-04-10 (×23): qty 1

## 2021-04-10 MED ORDER — HYDROCODONE-ACETAMINOPHEN 5-325 MG PO TABS
1.0000 | ORAL_TABLET | ORAL | Status: DC | PRN
  Administered 2021-04-10 (×2): 2 via ORAL
  Filled 2021-04-10 (×3): qty 2

## 2021-04-10 MED ORDER — SODIUM CHLORIDE 0.9 % IV SOLN: INTRAVENOUS | Status: DC

## 2021-04-10 MED ORDER — MAGNESIUM SULFATE 2 GM/50ML IV SOLN
2.0000 g | Freq: Once | INTRAVENOUS | Status: AC
  Administered 2021-04-10: 2 g via INTRAVENOUS
  Filled 2021-04-10: qty 50

## 2021-04-10 MED ORDER — LACTATED RINGERS IV BOLUS (SEPSIS)
1000.0000 mL | Freq: Once | INTRAVENOUS | Status: AC
  Administered 2021-04-10: 1000 mL via INTRAVENOUS

## 2021-04-10 MED ORDER — ALBUTEROL SULFATE (2.5 MG/3ML) 0.083% IN NEBU: 2.5000 mg | INHALATION_SOLUTION | RESPIRATORY_TRACT | Status: DC | PRN

## 2021-04-10 MED ORDER — MEMANTINE HCL 10 MG PO TABS
10.0000 mg | ORAL_TABLET | Freq: Every day | ORAL | Status: DC
  Administered 2021-04-10 – 2021-04-18 (×9): 10 mg via ORAL
  Filled 2021-04-10 (×9): qty 1

## 2021-04-10 MED ORDER — INSULIN ASPART 100 UNIT/ML IJ SOLN
0.0000 [IU] | Freq: Three times a day (TID) | INTRAMUSCULAR | Status: DC
  Administered 2021-04-10 – 2021-04-12 (×3): 2 [IU] via SUBCUTANEOUS

## 2021-04-10 MED ORDER — ACETAMINOPHEN 325 MG PO TABS
650.0000 mg | ORAL_TABLET | Freq: Four times a day (QID) | ORAL | Status: DC | PRN
  Administered 2021-04-10: 650 mg via ORAL
  Filled 2021-04-10: qty 2

## 2021-04-10 NOTE — Progress Notes (Signed)
A consult was received from an ED physician for Zosyn and vancomycin per pharmacy dosing.  The patient's profile has been reviewed for ht/wt/allergies/indication/available labs.   A one time order has been placed for Zosyn 3.375 grams IV x 1 and vancomycin 1 gram IV x 1.  Further antibiotics/pharmacy consults should be ordered by admitting physician if indicated.                       Thank you, Clayburn Pert, PharmD, BCPS 04/10/2021  11:49 AM  Please use Amion to locate phone number for the unit pharmacist ([facility])

## 2021-04-10 NOTE — Progress Notes (Signed)
Pharmacy Antibiotic Note  Carrie [name] is a 65 y.o. female admitted on 04/10/2021 with sepsis, suspected source pyelonephritis/UTI.  Pharmacy has been consulted for cefepime dosing.  Today, 04/10/21 WBC slightly elevated SCR 4.76. Pt admitted with AKI, CrCl ~10 mL/min Tmax 101.6 F  Plan: Cefepime 1 g IV q24h for CrCl < 11 mL/min Follow renal function, culture data  Height: 5' (152.4 cm) Weight: 64.9 kg (143 lb) IBW/kg (Calculated) : 45.5  Temp (24hrs), Avg:99.4 F (37.4 C), Min:98.1 F (36.7 C), Max:101.6 F (38.7 C)  Recent Labs  Lab 04/10/21 1038 04/10/21 1043  WBC  --  12.7*  CREATININE  --  4.76*  LATICACIDVEN 1.8  --     Estimated Creatinine Clearance: 10.2 mL/min (A) (by C-G formula based on SCr of 4.76 mg/dL (H)).    Allergies  Allergen Reactions   Lisinopril Cough    Antimicrobials this admission: cefepime 6/27 >>  Vancomycin  6/27 x1 dose in ED  Piperacillin/tazobactam 6/27 x1 dose in ED  Dose adjustments this admission:  Microbiology results: 6/27 BCx: Sent 6/27 UCx: Sent   Thank you for allowing pharmacy to be a part of this patient's care.  Lenis Noon, PharmD 04/10/2021 4:50 PM

## 2021-04-10 NOTE — Progress Notes (Signed)
Patient is now in the yellow mews. BP is noted to be 92/55. AMION page sent to Dr. Marylyn Ishihara. Will continue to monitor patient.   04/10/21 1854  Assess: MEWS Score  Temp 99.2 F (37.3 C)  BP (!) 92/55  Pulse Rate (!) 105  Resp 20  Level of Consciousness Alert  SpO2 93 %  O2 Device Room Air  Assess: MEWS Score  MEWS Temp 0  MEWS Systolic 1  MEWS Pulse 1  MEWS RR 0  MEWS LOC 0  MEWS Score 2  MEWS Score Color Yellow  Assess: if the MEWS score is Yellow or Red  Were vital signs taken at a resting state? Yes  Focused Assessment No change from prior assessment  Does the patient meet 2 or more of the SIRS criteria? Yes  Does the patient have a confirmed or suspected source of infection? Yes  Provider and Rapid Response Notified? No (notified at 1605 / previously red)  MEWS guidelines implemented *See Row Information* No, previously red, continue vital signs every 4 hours  Notify: Provider  Provider Name/Title Dr. Marylyn Ishihara  Date Provider Notified 04/10/21  Time Provider Notified 5872  Notification Type Page  Notification Reason Other (Comment) (BP 92/55)  Assess: SIRS CRITERIA  SIRS Temperature  0  SIRS Pulse 1  SIRS Respirations  0  SIRS WBC 1  SIRS Score Sum  2

## 2021-04-10 NOTE — Progress Notes (Signed)
Patient arrived from ED on stretcher. Patient didn't feel like she could walk to the bed. Transferred patient to bed with 2 assist. Oriented patient to room and call light. Patient VS were obtained and it was noted that patient was in the Red MEWS. Charge nurse Lovie Macadamia was notified. Rapid response nurse was notified and a AMION page sent to Dr. Marylyn Ishihara. See new orders. IVF's started and tylenol given. Will continue to monitor patient.   04/10/21 1605  Assess: MEWS Score  Temp (!) 101.6 F (38.7 C)  BP 126/70  Pulse Rate (!) 109  Resp (!) 22  SpO2 94 %  O2 Device Room Air  Assess: MEWS Score  MEWS Temp 2  MEWS Systolic 0  MEWS Pulse 1  MEWS RR 1  MEWS LOC 0  MEWS Score 4  MEWS Score Color Red  Assess: if the MEWS score is Yellow or Red  Were vital signs taken at a resting state? Yes  Focused Assessment Change from prior assessment (see assessment flowsheet)  Does the patient meet 2 or more of the SIRS criteria? Yes  Does the patient have a confirmed or suspected source of infection? Yes  Provider and Rapid Response Notified? Yes  MEWS guidelines implemented *See Row Information* Yes  Treat  MEWS Interventions Administered scheduled meds/treatments;Administered prn meds/treatments  Pain Scale 0-10  Pain Score 10  Pain Type Acute pain  Pain Location Head  Pain Orientation Medial  Pain Descriptors / Indicators Constant;Aching;Throbbing  Pain Frequency Constant  Pain Onset On-going  Patients Stated Pain Goal 0  Pain Intervention(s) Medication (See eMAR);Relaxation;Rest  Multiple Pain Sites No  Take Vital Signs  Increase Vital Sign Frequency  Red: Q 1hr X 4 then Q 4hr X 4, if remains red, continue Q 4hrs  Escalate  MEWS: Escalate Red: discuss with charge nurse/RN and provider, consider discussing with RRT  Notify: Charge Nurse/RN  Name of Charge Nurse/RN Notified Lovie Macadamia, RN  Date Charge Nurse/RN Notified 04/10/21  Time Charge Nurse/RN Notified 1605  Notify:  Provider  Provider Name/Title Dr. Marylyn Ishihara  Date Provider Notified 04/10/21  Time Provider Notified 1605  Notification Type Page  Notification Reason Change in status  Provider response See new orders  Date of Provider Response 04/10/21  Time of Provider Response 1606  Notify: Rapid Response  Name of Rapid Response RN Notified Christian, RN  Date Rapid Response Notified 04/10/21  Time Rapid Response Notified 4128  Document  Patient Outcome Other (Comment) (remains on unit)  Progress note created (see row info) Yes  Assess: SIRS CRITERIA  SIRS Temperature  1  SIRS Pulse 1  SIRS Respirations  1  SIRS WBC 0  SIRS Score Sum  3
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
## 2021-04-10 NOTE — ED Notes (Signed)
Patient transported to CT 

## 2021-04-10 NOTE — Progress Notes (Signed)
Patient continues to be in the Red MEWS. Temp has increased from 101.6 to 102.2. Ice packs applied under patients arms and PRN pain medication given. Will continue to monitor patient.   04/10/21 1705  Assess: MEWS Score  Temp (!) 102.2 F (39 C) (Ice packs applied under patients arms)  BP (!) 111/58  Pulse Rate (!) 117  Resp (!) 24  Level of Consciousness Alert  SpO2 94 %  O2 Device Room Air  Assess: MEWS Score  MEWS Temp 2  MEWS Systolic 0  MEWS Pulse 2  MEWS RR 1  MEWS LOC 0  MEWS Score 5  MEWS Score Color Red  Assess: if the MEWS score is Yellow or Red  Were vital signs taken at a resting state? Yes  Focused Assessment No change from prior assessment  Does the patient meet 2 or more of the SIRS criteria? Yes  Does the patient have a confirmed or suspected source of infection? Yes  Provider and Rapid Response Notified? No (previously red / notified at 1605)  MEWS guidelines implemented *See Row Information* No, previously red, continue vital signs every 4 hours  Assess: SIRS CRITERIA  SIRS Temperature  1  SIRS Pulse 1  SIRS Respirations  1  SIRS WBC 1  SIRS Score Sum  4
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
## 2021-04-11 ENCOUNTER — Inpatient Hospital Stay (HOSPITAL_COMMUNITY): Payer: Medicare Other

## 2021-04-11 DIAGNOSIS — A419 Sepsis, unspecified organism: Principal | ICD-10-CM

## 2021-04-11 DIAGNOSIS — I959 Hypotension, unspecified: Secondary | ICD-10-CM

## 2021-04-11 LAB — PROTIME-INR
INR: 1.2 (ref 0.8–1.2)
Prothrombin Time: 15.5 seconds — ABNORMAL HIGH (ref 11.4–15.2)

## 2021-04-11 LAB — CBC
HCT: 28.5 % — ABNORMAL LOW (ref 36.0–46.0)
Hemoglobin: 9.3 g/dL — ABNORMAL LOW (ref 12.0–15.0)
MCH: 29.3 pg (ref 26.0–34.0)
MCHC: 32.6 g/dL (ref 30.0–36.0)
MCV: 89.9 fL (ref 80.0–100.0)
Platelets: 130 10*3/uL — ABNORMAL LOW (ref 150–400)
RBC: 3.17 MIL/uL — ABNORMAL LOW (ref 3.87–5.11)
RDW: 14.6 % (ref 11.5–15.5)
WBC: 7.6 10*3/uL (ref 4.0–10.5)
nRBC: 0 % (ref 0.0–0.2)

## 2021-04-11 LAB — BASIC METABOLIC PANEL
Anion gap: 11 (ref 5–15)
BUN: 59 mg/dL — ABNORMAL HIGH (ref 8–23)
CO2: 17 mmol/L — ABNORMAL LOW (ref 22–32)
Calcium: 7 mg/dL — ABNORMAL LOW (ref 8.9–10.3)
Chloride: 103 mmol/L (ref 98–111)
Creatinine, Ser: 5.22 mg/dL — ABNORMAL HIGH (ref 0.44–1.00)
GFR, Estimated: 9 mL/min — ABNORMAL LOW (ref >60)
Glucose, Bld: 130 mg/dL — ABNORMAL HIGH (ref 70–99)
Potassium: 3.9 mmol/L (ref 3.5–5.1)
Sodium: 131 mmol/L — ABNORMAL LOW (ref 135–145)

## 2021-04-11 LAB — COMPREHENSIVE METABOLIC PANEL
ALT: 26 U/L (ref 0–44)
AST: 44 U/L — ABNORMAL HIGH (ref 15–41)
Albumin: 2.5 g/dL — ABNORMAL LOW (ref 3.5–5.0)
Alkaline Phosphatase: 62 U/L (ref 38–126)
Anion gap: 13 (ref 5–15)
BUN: 54 mg/dL — ABNORMAL HIGH (ref 8–23)
CO2: 18 mmol/L — ABNORMAL LOW (ref 22–32)
Calcium: 7.1 mg/dL — ABNORMAL LOW (ref 8.9–10.3)
Chloride: 100 mmol/L (ref 98–111)
Creatinine, Ser: 5.02 mg/dL — ABNORMAL HIGH (ref 0.44–1.00)
GFR, Estimated: 9 mL/min — ABNORMAL LOW (ref >60)
Glucose, Bld: 131 mg/dL — ABNORMAL HIGH (ref 70–99)
Potassium: 3.2 mmol/L — ABNORMAL LOW (ref 3.5–5.1)
Sodium: 131 mmol/L — ABNORMAL LOW (ref 135–145)
Total Bilirubin: 1 mg/dL (ref 0.3–1.2)
Total Protein: 6.2 g/dL — ABNORMAL LOW (ref 6.5–8.1)

## 2021-04-11 LAB — CORTISOL-AM, BLOOD: Cortisol - AM: 23.4 ug/dL — ABNORMAL HIGH (ref 6.7–22.6)

## 2021-04-11 LAB — GLUCOSE, CAPILLARY
Glucose-Capillary: 128 mg/dL — ABNORMAL HIGH (ref 70–99)
Glucose-Capillary: 102 mg/dL — ABNORMAL HIGH (ref 70–99)
Glucose-Capillary: 111 mg/dL — ABNORMAL HIGH (ref 70–99)
Glucose-Capillary: 94 mg/dL (ref 70–99)

## 2021-04-11 LAB — HEPATITIS PANEL, ACUTE
HCV Ab: NONREACTIVE
Hep A IgM: NONREACTIVE
Hep B C IgM: NONREACTIVE
Hepatitis B Surface Ag: NONREACTIVE

## 2021-04-11 LAB — HIV ANTIBODY (ROUTINE TESTING W REFLEX): HIV Screen 4th Generation wRfx: NONREACTIVE

## 2021-04-11 LAB — AMMONIA: Ammonia: 18 umol/L (ref 9–35)

## 2021-04-11 LAB — PROCALCITONIN: Procalcitonin: 7.23 ng/mL

## 2021-04-11 LAB — MAGNESIUM: Magnesium: 2.1 mg/dL (ref 1.7–2.4)

## 2021-04-11 MED ORDER — SODIUM CHLORIDE 0.9 % IV BOLUS
500.0000 mL | Freq: Once | INTRAVENOUS | Status: AC
  Administered 2021-04-11: 500 mL via INTRAVENOUS

## 2021-04-11 MED ORDER — LACTULOSE 10 GM/15ML PO SOLN
20.0000 g | Freq: Three times a day (TID) | ORAL | Status: DC
  Administered 2021-04-11 – 2021-04-12 (×2): 20 g via ORAL
  Filled 2021-04-11 (×3): qty 30

## 2021-04-11 MED ORDER — CHLORPROMAZINE HCL 10 MG PO TABS
10.0000 mg | ORAL_TABLET | Freq: Three times a day (TID) | ORAL | Status: DC | PRN
  Filled 2021-04-11: qty 1

## 2021-04-11 MED ORDER — MIDODRINE HCL 5 MG PO TABS
10.0000 mg | ORAL_TABLET | Freq: Three times a day (TID) | ORAL | Status: DC
  Administered 2021-04-12: 10 mg via ORAL
  Filled 2021-04-11 (×4): qty 2

## 2021-04-11 MED ORDER — SODIUM CHLORIDE 0.9 % IV BOLUS
1000.0000 mL | Freq: Once | INTRAVENOUS | Status: AC
  Administered 2021-04-11: 1000 mL via INTRAVENOUS

## 2021-04-11 MED ORDER — LACTATED RINGERS IV BOLUS
500.0000 mL | Freq: Once | INTRAVENOUS | Status: AC
  Administered 2021-04-11: 500 mL via INTRAVENOUS

## 2021-04-11 NOTE — Progress Notes (Signed)
During 1208 VS it was noted by this nurse that patient was more drowsy than she was earlier. She would wake up to answer questions but would fall asleep trying to answer the questions. Spoke w/ Harvest Dark Charge RN and message sent to Dr. Cruzita Lederer. Dr. Cruzita Lederer at bedside to evaluate patient. See new orders. Will continue to monitor patient.
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
## 2021-04-11 NOTE — Progress Notes (Signed)
During hourly rounds w/ patient, patient was experiencing confusion. She wasn't able to tell me her DOB, she stated that she was at the hospital in [facility], and she wasn't able to tell me the month or year. Spoke w/ Harvest Dark charge RN and rapid response was called. Hannah sent message to Dr. Cruzita Lederer. Christian RRN came and assessed patient. Dr. Cruzita Lederer at bedside. Patient was more alert and was able to give her name and DOB, where she was, and who the president was. She still wasn't able to answer correctly the current year. She did have moments where she would stutter over her answer and would answer inappropriately. VS BP 85/52 MAP 63 HR 95 O2 95% on room air. Patient remains on the unit. Will continue to monitor.

## 2021-04-12 ENCOUNTER — Encounter (HOSPITAL_COMMUNITY): Payer: Self-pay | Admitting: Internal Medicine

## 2021-04-12 ENCOUNTER — Inpatient Hospital Stay (HOSPITAL_COMMUNITY): Payer: Medicare Other

## 2021-04-12 DIAGNOSIS — G9341 Metabolic encephalopathy: Secondary | ICD-10-CM

## 2021-04-12 DIAGNOSIS — N179 Acute kidney failure, unspecified: Secondary | ICD-10-CM

## 2021-04-12 DIAGNOSIS — D72829 Elevated white blood cell count, unspecified: Secondary | ICD-10-CM

## 2021-04-12 HISTORY — PX: IR US GUIDE VASC ACCESS RIGHT: IMG2390

## 2021-04-12 HISTORY — PX: IR FLUORO GUIDE CV LINE RIGHT: IMG2283

## 2021-04-12 LAB — GLUCOSE, CAPILLARY
Glucose-Capillary: 107 mg/dL — ABNORMAL HIGH (ref 70–99)
Glucose-Capillary: 149 mg/dL — ABNORMAL HIGH (ref 70–99)
Glucose-Capillary: 81 mg/dL (ref 70–99)
Glucose-Capillary: 103 mg/dL — ABNORMAL HIGH (ref 70–99)

## 2021-04-12 LAB — CBC
HCT: 30.6 % — ABNORMAL LOW (ref 36.0–46.0)
Hemoglobin: 9.7 g/dL — ABNORMAL LOW (ref 12.0–15.0)
MCH: 29.5 pg (ref 26.0–34.0)
MCHC: 31.7 g/dL (ref 30.0–36.0)
MCV: 93 fL (ref 80.0–100.0)
Platelets: 194 10*3/uL (ref 150–400)
RBC: 3.29 MIL/uL — ABNORMAL LOW (ref 3.87–5.11)
RDW: 15.3 % (ref 11.5–15.5)
WBC: 10.7 10*3/uL — ABNORMAL HIGH (ref 4.0–10.5)
nRBC: 0 % (ref 0.0–0.2)

## 2021-04-12 LAB — COMPREHENSIVE METABOLIC PANEL
ALT: 22 U/L (ref 0–44)
AST: 32 U/L (ref 15–41)
Albumin: 2.3 g/dL — ABNORMAL LOW (ref 3.5–5.0)
Alkaline Phosphatase: 74 U/L (ref 38–126)
Anion gap: 11 (ref 5–15)
BUN: 66 mg/dL — ABNORMAL HIGH (ref 8–23)
CO2: 13 mmol/L — ABNORMAL LOW (ref 22–32)
Calcium: 7 mg/dL — ABNORMAL LOW (ref 8.9–10.3)
Chloride: 109 mmol/L (ref 98–111)
Creatinine, Ser: 5.41 mg/dL — ABNORMAL HIGH (ref 0.44–1.00)
GFR, Estimated: 8 mL/min — ABNORMAL LOW (ref >60)
Glucose, Bld: 118 mg/dL — ABNORMAL HIGH (ref 70–99)
Potassium: 4.2 mmol/L (ref 3.5–5.1)
Sodium: 133 mmol/L — ABNORMAL LOW (ref 135–145)
Total Bilirubin: 0.9 mg/dL (ref 0.3–1.2)
Total Protein: 6.2 g/dL — ABNORMAL LOW (ref 6.5–8.1)

## 2021-04-12 LAB — CREATININE, URINE, RANDOM: Creatinine, Urine: 55.57 mg/dL

## 2021-04-12 LAB — SODIUM, URINE, RANDOM: Sodium, Ur: 53 mmol/L

## 2021-04-12 MED ORDER — HEPARIN SODIUM (PORCINE) 1000 UNIT/ML IJ SOLN
INTRAMUSCULAR | Status: AC
  Filled 2021-04-12: qty 1

## 2021-04-12 MED ORDER — LIDOCAINE HCL 1 % IJ SOLN
INTRAMUSCULAR | Status: AC | PRN
  Administered 2021-04-12: 10 mL via INTRADERMAL

## 2021-04-12 MED ORDER — HEPARIN SODIUM (PORCINE) 1000 UNIT/ML IJ SOLN
INTRAMUSCULAR | Status: AC | PRN
  Administered 2021-04-12: 2.6 mL

## 2021-04-12 MED ORDER — HEPARIN SODIUM (PORCINE) 1000 UNIT/ML DIALYSIS
2000.0000 [IU] | INTRAMUSCULAR | Status: DC | PRN
  Filled 2021-04-12: qty 2

## 2021-04-12 MED ORDER — SODIUM CHLORIDE 0.9 % IV SOLN
1.0000 g | INTRAVENOUS | Status: DC
  Administered 2021-04-12 – 2021-04-14 (×3): 1 g via INTRAVENOUS
  Filled 2021-04-12 (×4): qty 10

## 2021-04-12 MED ORDER — CHLORHEXIDINE GLUCONATE CLOTH 2 % EX PADS: 6.0000 | MEDICATED_PAD | Freq: Every day | CUTANEOUS | Status: DC

## 2021-04-12 MED ORDER — LIDOCAINE HCL 1 % IJ SOLN
INTRAMUSCULAR | Status: AC
  Filled 2021-04-12: qty 20

## 2021-04-12 MED ORDER — MIDODRINE HCL 5 MG PO TABS
5.0000 mg | ORAL_TABLET | Freq: Three times a day (TID) | ORAL | Status: DC
  Administered 2021-04-12 – 2021-04-13 (×2): 5 mg via ORAL
  Filled 2021-04-12 (×4): qty 1

## 2021-04-12 MED ORDER — LACTULOSE 10 GM/15ML PO SOLN
10.0000 g | Freq: Two times a day (BID) | ORAL | Status: DC
  Administered 2021-04-12 – 2021-04-14 (×4): 10 g via ORAL
  Filled 2021-04-12 (×4): qty 15

## 2021-04-12 NOTE — Procedures (Signed)
Interventional Radiology Procedure Note  Procedure: Right IJ non-tunneled HD catheter placement  Complications: None  Estimated Blood Loss: < 10 mL  Findings: 16 cm, 13 Fr Mahurkar catheter via right IJ. Tip in RA. OK to use.  Venetia Night. Kathlene Cote, M.D Pager:  [phone]

## 2021-04-12 NOTE — Progress Notes (Signed)
Escorted off unit via [facility] to [address]  No complaints noted Daughter notified of transfer
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
## 2021-04-13 DIAGNOSIS — L8990 Pressure ulcer of unspecified site, unspecified stage: Secondary | ICD-10-CM | POA: Insufficient documentation

## 2021-04-13 LAB — CBC WITH DIFFERENTIAL/PLATELET
Abs Immature Granulocytes: 0.17 10*3/uL — ABNORMAL HIGH (ref 0.00–0.07)
Basophils Absolute: 0.1 10*3/uL (ref 0.0–0.1)
Basophils Relative: 1 %
Eosinophils Absolute: 0.2 10*3/uL (ref 0.0–0.5)
Eosinophils Relative: 2 %
HCT: 30.4 % — ABNORMAL LOW (ref 36.0–46.0)
Hemoglobin: 9.7 g/dL — ABNORMAL LOW (ref 12.0–15.0)
Immature Granulocytes: 3 %
Lymphocytes Relative: 30 %
Lymphs Abs: 2.1 10*3/uL (ref 0.7–4.0)
MCH: 29.1 pg (ref 26.0–34.0)
MCHC: 31.9 g/dL (ref 30.0–36.0)
MCV: 91.3 fL (ref 80.0–100.0)
Monocytes Absolute: 0.6 10*3/uL (ref 0.1–1.0)
Monocytes Relative: 8 %
Neutro Abs: 3.9 10*3/uL (ref 1.7–7.7)
Neutrophils Relative %: 56 %
Platelets: 199 10*3/uL (ref 150–400)
RBC: 3.33 MIL/uL — ABNORMAL LOW (ref 3.87–5.11)
RDW: 15.6 % — ABNORMAL HIGH (ref 11.5–15.5)
WBC: 6.9 10*3/uL (ref 4.0–10.5)
nRBC: 0 % (ref 0.0–0.2)

## 2021-04-13 LAB — COMPREHENSIVE METABOLIC PANEL
ALT: 22 U/L (ref 0–44)
AST: 35 U/L (ref 15–41)
Albumin: 2 g/dL — ABNORMAL LOW (ref 3.5–5.0)
Alkaline Phosphatase: 73 U/L (ref 38–126)
Anion gap: 12 (ref 5–15)
BUN: 55 mg/dL — ABNORMAL HIGH (ref 8–23)
CO2: 11 mmol/L — ABNORMAL LOW (ref 22–32)
Calcium: 7.4 mg/dL — ABNORMAL LOW (ref 8.9–10.3)
Chloride: 112 mmol/L — ABNORMAL HIGH (ref 98–111)
Creatinine, Ser: 4.63 mg/dL — ABNORMAL HIGH (ref 0.44–1.00)
GFR, Estimated: 10 mL/min — ABNORMAL LOW (ref >60)
Glucose, Bld: 109 mg/dL — ABNORMAL HIGH (ref 70–99)
Potassium: 3.9 mmol/L (ref 3.5–5.1)
Sodium: 135 mmol/L (ref 135–145)
Total Bilirubin: 0.6 mg/dL (ref 0.3–1.2)
Total Protein: 6 g/dL — ABNORMAL LOW (ref 6.5–8.1)

## 2021-04-13 LAB — GLUCOSE, CAPILLARY
Glucose-Capillary: 86 mg/dL (ref 70–99)
Glucose-Capillary: 103 mg/dL — ABNORMAL HIGH (ref 70–99)
Glucose-Capillary: 120 mg/dL — ABNORMAL HIGH (ref 70–99)
Glucose-Capillary: 94 mg/dL (ref 70–99)
Glucose-Capillary: 93 mg/dL (ref 70–99)

## 2021-04-13 LAB — URINE CULTURE: Culture: >=100000 — AB

## 2021-04-13 LAB — MAGNESIUM: Magnesium: 1.9 mg/dL (ref 1.7–2.4)

## 2021-04-13 LAB — TSH: TSH: 2.685 u[IU]/mL (ref 0.350–4.500)

## 2021-04-13 LAB — VITAMIN B12: Vitamin B-12: 228 pg/mL (ref 180–914)

## 2021-04-13 LAB — FOLATE: Folate: 9.1 ng/mL (ref >5.9)

## 2021-04-13 MED ORDER — INSULIN ASPART 100 UNIT/ML IJ SOLN
0.0000 [IU] | Freq: Three times a day (TID) | INTRAMUSCULAR | Status: DC
Start: 2021-04-13 — End: 2021-04-19
  Administered 2021-04-15 – 2021-04-18 (×3): 2 [IU] via SUBCUTANEOUS
  Administered 2021-04-18: 3 [IU] via SUBCUTANEOUS
  Administered 2021-04-19: 2 [IU] via SUBCUTANEOUS

## 2021-04-13 NOTE — Progress Notes (Signed)
PROGRESS NOTE    Carrie [name]  YPP:[id number] DOB: 1956/02/21 DOA: 04/10/2021 PCP: Pcp, No    Brief Narrative:  65 year old female with history of hypertension, hyperlipidemia, diabetes mellitus type 2, cirrhosis of liver presented with dizziness, weakness, dysuria and increased frequency.  On presentation, she was found to have acute kidney injury along with acute pyelonephritis based on CT findings along with leukocytosis.  She was started on IV fluids and antibiotics.  Assessment & Plan:   Active Problems:   Pyelonephritis   Pressure injury of skin  Acute kidney injury Acute metabolic acidosis Acute metabolic encephalopathy -Presented with creatinine of 4.76; creatinine has continued to get worse and is 5.41 today.  Urine output is not that good.  Renal ultrasound was negative for hydronephrosis. -Pt had been given IVF hydration -Noted to be recently confused and has some asterixis: Concerns for uremia.   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  LFTs have improved.  Outpatient follow-up with GI -Recent ammonia level is normal -Cont to follow LFT trends   Hypokalemia -normalized  Hyponatremia -Normalized.  Currently on IV fluids.  Monitor   Thrombocytopenia -normalized   Leukocytosis -Resolved   Possible anemia of chronic disease -From chronic illnesses.  Hemoglobin stable.  No signs of bleeding.   -Cont to monitor  Essential hypertension -Olmesartan and amlodipine on hold.  -BP stable at present, albeit suboptimally controlled  Reported memory deficit -Continued on namenda   Psoriatic arthritis/chronic pain -Outpatient follow-up    Essential tremor -Continue primidone   Anxiety/depression -Continue buspirone, sertraline -Seems stable   Diabetes mellitus type II -Continue sliding scale insulin   History of mesenteric artery  stenosis -Continue aspirin.  Statin held on presentation due to elevated LFTs.  Can possibly resume on discharge.    DVT prophylaxis: SCD's Code Status: Full Family Communication: Pt in room, family not at bedside  Status is: Inpatient  Remains inpatient appropriate because:Inpatient level of care appropriate due to severity of illness  Dispo: The patient is from: Home              Anticipated d/c is to: Home              Patient currently is not medically stable to d/c.   Difficult to place patient No       Consultants:  nephrology  Procedures:    Antimicrobials: Anti-infectives (From admission, onward)    Start     Dose/Rate Route Frequency Ordered Stop   04/12/21 2200  cefTRIAXone (ROCEPHIN) 1 g in sodium chloride 0.9 % 100 mL IVPB        1 g 200 mL/hr over 30 Minutes Intravenous Every 24 hours 04/12/21 0906     04/10/21 2200  ceFEPIme (MAXIPIME) 1 g in sodium chloride 0.9 % 100 mL IVPB  Status:  Discontinued        1 g 200 mL/hr over 30 Minutes Intravenous Every 24 hours 04/10/21 1700 04/12/21 0906   04/10/21 1200  vancomycin (VANCOCIN) IVPB 1000 mg/200 mL premix        1,000 mg 200 mL/hr over 60 Minutes Intravenous  Once 04/10/21 1148 04/10/21 1306   04/10/21 1145  piperacillin-tazobactam (ZOSYN) IVPB 3.375 g        3.375 g 100 mL/hr over 30 Minutes Intravenous STAT 04/10/21 1138 04/10/21 1211       Subjective: Without complaints. Somewhat confused  Objective:  Vitals:   04/12/21 2355 04/13/21 0351 04/13/21 0901 04/13/21 1136  BP: 131/71 127/71  (!) 158/78  Pulse: 74 72  74  Resp: 16 18  18   Temp: (!) 97.5 F (36.4 C) 97.6 F (36.4 C)  97.9 F (36.6 C)  TempSrc: Oral Oral  Oral  SpO2: 98% 98% 98% 98%  Weight:  62.5 kg    Height:        Intake/Output Summary (Last 24 hours) at 04/13/2021 1528 Last data filed at 04/13/2021 1500 Gross per 24 hour  Intake 3782.23 ml  Output 650 ml  Net 3132.23 ml   Filed Weights   04/10/21 1621 04/12/21 2258  04/13/21 0351  Weight: 64.9 kg 73.8 kg 62.5 kg    Examination: General exam: Awake, laying in bed, in nad Respiratory system: Normal respiratory effort, no wheezing Cardiovascular system: regular rate, s1, s2 Gastrointestinal system: Soft, nondistended, positive BS Central nervous system: CN2-12 grossly intact, strength intact Extremities: Perfused, no clubbing Skin: Normal skin turgor, no notable skin lesions seen Psychiatry: Mood normal // no visual hallucinations   Data Reviewed: I have personally reviewed following labs and imaging studies  CBC: Recent Labs  Lab 04/10/21 1043 04/11/21 0437 04/12/21 0454 04/13/21 0511  WBC 12.7* 7.6 10.7* 6.9  NEUTROABS 10.5*  --   --  3.9  HGB 11.5* 9.3* 9.7* 9.7*  HCT 34.2* 28.5* 30.6* 30.4*  MCV 88.8 89.9 93.0 91.3  PLT 144* 130* 194 244   Basic Metabolic Panel: Recent Labs  Lab 04/10/21 1043 04/10/21 1640 04/11/21 0437 04/11/21 1415 04/12/21 0454 04/13/21 0511  NA 131*  --  131* 131* 133* 135  K 3.2*  --  3.2* 3.9 4.2 3.9  CL 93*  --  100 103 109 112*  CO2 20*  --  18* 17* 13* 11*  GLUCOSE 265*  --  131* 130* 118* 109*  BUN 52*  --  54* 59* 66* 55*  CREATININE 4.76*  --  5.02* 5.22* 5.41* 4.63*  CALCIUM 8.3*  --  7.1* 7.0* 7.0* 7.4*  MG  --  1.6* 2.1  --   --  1.9   GFR: Estimated Creatinine Clearance: 10.3 mL/min (A) (by C-G formula based on SCr of 4.63 mg/dL (H)). Liver Function Tests: Recent Labs  Lab 04/10/21 1043 04/11/21 0437 04/12/21 0454 04/13/21 0511  AST 101* 44* 32 35  ALT 43 26 22 22   ALKPHOS 83 62 74 73  BILITOT 1.3* 1.0 0.9 0.6  PROT 7.9 6.2* 6.2* 6.0*  ALBUMIN 3.3* 2.5* 2.3* 2.0*   No results for input(s): LIPASE, AMYLASE in the last 168 hours. Recent Labs  Lab 04/11/21 1415  AMMONIA 18   Coagulation Profile: Recent Labs  Lab 04/10/21 1043 04/11/21 0437  INR 1.1 1.2   Cardiac Enzymes: No results for input(s): CKTOTAL, CKMB, CKMBINDEX, TROPONINI in the last 168 hours. BNP (last 3  results) No results for input(s): PROBNP in the last 8760 hours. HbA1C: No results for input(s): HGBA1C in the last 72 hours. CBG: Recent Labs  Lab 04/12/21 1720 04/12/21 2115 04/12/21 2230 04/13/21 0552 04/13/21 1135  GLUCAP 81 103* 86 103* 120*   Lipid Profile: No results for input(s): CHOL, HDL, LDLCALC, TRIG, CHOLHDL, LDLDIRECT in the last 72 hours. Thyroid Function Tests: Recent Labs    04/13/21 0511  TSH 2.685   Anemia Panel: Recent Labs    04/13/21 0511  VITAMINB12 228  FOLATE 9.1   Sepsis Labs: Recent Labs  Lab 04/10/21 1038 04/11/21 0437  PROCALCITON  --  7.23  LATICACIDVEN 1.8  --     Recent Results (from the past 240 hour(s))  Blood culture (routine single)     Status: None (Preliminary result)   Collection Time: 04/10/21 10:53 AM   Specimen: BLOOD  Result Value Ref Range Status   Specimen Description   Final    BLOOD RIGHT ANTECUBITAL Performed at [facility] [address] [place], [place] [address]    Special Requests   Final    BOTTLES DRAWN AEROBIC AND ANAEROBIC Blood Culture results may not be optimal due to an excessive volume of blood received in culture bottles Performed at [facility] [address] [place], [place] [address]    Culture   Final    NO GROWTH 3 DAYS Performed at [facility], [place] [address] [place], [place] [address]    Report Status PENDING  Incomplete  Resp Panel by RT-PCR (Flu A&B, Covid) Nasopharyngeal Swab     Status: None   Collection Time: 04/10/21 11:21 AM   Specimen: Nasopharyngeal Swab; Nasopharyngeal(NP) swabs in vial transport medium  Result Value Ref Range Status   SARS Coronavirus 2 by RT PCR NEGATIVE NEGATIVE Final    Comment: (NOTE) SARS-CoV-2 target nucleic acids are NOT DETECTED.  The SARS-CoV-2 RNA is generally detectable in upper respiratory specimens during the acute phase of infection. The lowest concentration of SARS-CoV-2 viral copies this  assay can detect is 138 copies/mL. A negative result does not preclude SARS-Cov-2 infection and should not be used as the sole basis for treatment or other patient management decisions. A negative result may occur with  improper specimen collection/handling, submission of specimen other than nasopharyngeal swab, presence of viral mutation(s) within the areas targeted by this assay, and inadequate number of viral copies(<138 copies/mL). A negative result must be combined with clinical observations, patient history, and epidemiological information. The expected result is Negative.  Fact Sheet for Patients:  EntrepreneurPulse.com.au  Fact Sheet for Healthcare Providers:  IncredibleEmployment.be  This test is no t yet approved or cleared by the [place] FDA and  has been authorized for detection and/or diagnosis of SARS-CoV-2 by FDA under an Emergency Use Authorization (EUA). This EUA will remain  in effect (meaning this test can be used) for the duration of the COVID-19 declaration under Section 564(b)(1) of the Act, 21 U.S.C.section 360bbb-3(b)(1), unless the authorization is terminated  or revoked sooner.       Influenza A by PCR NEGATIVE NEGATIVE Final   Influenza B by PCR NEGATIVE NEGATIVE Final    Comment: (NOTE) The Xpert Xpress SARS-CoV-2/FLU/RSV plus assay is intended as an aid in the diagnosis of influenza from Nasopharyngeal swab specimens and should not be used as a sole basis for treatment. Nasal washings and aspirates are unacceptable for Xpert Xpress SARS-CoV-2/FLU/RSV testing.  Fact Sheet for Patients: EntrepreneurPulse.com.au  Fact Sheet for Healthcare Providers: IncredibleEmployment.be  This test is not yet approved or cleared by the [place] FDA and has been authorized for detection and/or diagnosis of SARS-CoV-2 by FDA under an Emergency Use Authorization (EUA). This EUA will  remain in effect (meaning this test can be used) for the duration of the COVID-19 declaration under Section 564(b)(1) of the Act, 21 U.S.C. section 360bbb-3(b)(1), unless the authorization is terminated or revoked.  Performed at [facility], [place] [address], [place], [place] [address]   Urine culture     Status: Abnormal   Collection Time: 04/10/21  2:35 PM   Specimen: In/Out Cath Urine  Result Value Ref Range Status   Specimen Description   Final    IN/OUT CATH URINE Performed at [facility], [place] [address] [place], [place] [address]    Special Requests   Final    NONE Performed at [facility], [place] [address] [place], [place] [address]    Culture >=100,000 COLONIES/mL KLEBSIELLA PNEUMONIAE (A)  Final   Report Status 04/13/2021 FINAL  Final   Organism ID, Bacteria KLEBSIELLA PNEUMONIAE (A)  Final      Susceptibility   Klebsiella pneumoniae - MIC*    AMPICILLIN RESISTANT Resistant     CEFAZOLIN <=4 SENSITIVE Sensitive     CEFEPIME <=0.12 SENSITIVE Sensitive     CEFTRIAXONE <=0.25 SENSITIVE Sensitive     CIPROFLOXACIN <=0.25 SENSITIVE Sensitive     GENTAMICIN <=1 SENSITIVE Sensitive     IMIPENEM <=0.25 SENSITIVE Sensitive     NITROFURANTOIN <=16 SENSITIVE Sensitive     TRIMETH/SULFA <=20 SENSITIVE Sensitive     AMPICILLIN/SULBACTAM <=2 SENSITIVE Sensitive     PIP/TAZO <=4 SENSITIVE Sensitive     * >=100,000 COLONIES/mL KLEBSIELLA PNEUMONIAE     Radiology Studies: US RENAL  Result Date: 04/11/2021 CLINICAL DATA:  Acute kidney injury. EXAM: RENAL / URINARY TRACT ULTRASOUND COMPLETE COMPARISON:  None. FINDINGS: Right Kidney: Renal measurements: 11.8 x 5.0 x 4.7 cm = volume: 143 mL. Echogenicity within normal limits. No mass, calculi or hydronephrosis visualized. Left Kidney: Renal measurements: 11.1 x 5.1 x 4.9 cm = volume: 145 mL. Echogenicity within normal limits. No mass, calculi or hydronephrosis visualized.  Bladder: Appears normal for degree of bladder distention. Other: None. IMPRESSION: Normal sonographic appearance of the kidneys. Electronically Signed   By: Aletta Edouard M.D.   On: 04/11/2021 16:36   IR Fluoro Guide CV Line Right  Result Date: 04/12/2021 CLINICAL DATA:  Acute kidney injury and need for temporary non tunneled dialysis catheter to begin hemodialysis. EXAM: NON-TUNNELED CENTRAL VENOUS HEMODIALYSIS CATHETER PLACEMENT WITH ULTRASOUND AND FLUOROSCOPIC GUIDANCE FLUOROSCOPY TIME:  18 seconds.  4.0 mGy. PROCEDURE: The procedure, risks, benefits, and alternatives were explained to the patient. Questions regarding the procedure were encouraged and answered. The patient understands and consents to the procedure. A time-out was performed prior to initiating the procedure. The right neck and chest were prepped with chlorhexidine in a sterile fashion, and a sterile drape was applied covering the operative field. Maximum barrier sterile technique with sterile gowns and gloves were used for the procedure. Local anesthesia was provided with 1% lidocaine. Ultrasound was used to confirm patency of the right internal jugular vein. After creating a small venotomy incision, a 21 gauge needle was advanced into the right internal jugular vein under direct, real-time ultrasound guidance. Ultrasound image documentation was performed. After securing guidewire access the venotomy was dilated over the wire. A 16 cm, 13 French triple-lumen Mahurkar catheter was then placed over the wire. Final catheter positioning was confirmed and documented with a fluoroscopic spot image. The catheter was aspirated, flushed with saline, and injected with appropriate volume heparin dwells. The catheter exit site was secured with 0-Prolene retention sutures. COMPLICATIONS: None.  No pneumothorax. FINDINGS: After catheter placement, the tip lies in the right atrium. The catheter aspirates normally and is ready for immediate use. IMPRESSION:  Placement of non-tunneled central venous hemodialysis catheter via the right internal jugular vein. The catheter tip lies in the right atrium. The catheter is ready for immediate use. Electronically Signed  By: Aletta Edouard M.D.   On: 04/12/2021 16:14   IR US Guide Vasc Access Right  Result Date: 04/12/2021 CLINICAL DATA:  Acute kidney injury and need for temporary non tunneled dialysis catheter to begin hemodialysis. EXAM: NON-TUNNELED CENTRAL VENOUS HEMODIALYSIS CATHETER PLACEMENT WITH ULTRASOUND AND FLUOROSCOPIC GUIDANCE FLUOROSCOPY TIME:  18 seconds.  4.0 mGy. PROCEDURE: The procedure, risks, benefits, and alternatives were explained to the patient. Questions regarding the procedure were encouraged and answered. The patient understands and consents to the procedure. A time-out was performed prior to initiating the procedure. The right neck and chest were prepped with chlorhexidine in a sterile fashion, and a sterile drape was applied covering the operative field. Maximum barrier sterile technique with sterile gowns and gloves were used for the procedure. Local anesthesia was provided with 1% lidocaine. Ultrasound was used to confirm patency of the right internal jugular vein. After creating a small venotomy incision, a 21 gauge needle was advanced into the right internal jugular vein under direct, real-time ultrasound guidance. Ultrasound image documentation was performed. After securing guidewire access the venotomy was dilated over the wire. A 16 cm, 13 French triple-lumen Mahurkar catheter was then placed over the wire. Final catheter positioning was confirmed and documented with a fluoroscopic spot image. The catheter was aspirated, flushed with saline, and injected with appropriate volume heparin dwells. The catheter exit site was secured with 0-Prolene retention sutures. COMPLICATIONS: None.  No pneumothorax. FINDINGS: After catheter placement, the tip lies in the right atrium. The catheter  aspirates normally and is ready for immediate use. IMPRESSION: Placement of non-tunneled central venous hemodialysis catheter via the right internal jugular vein. The catheter tip lies in the right atrium. The catheter is ready for immediate use. Electronically Signed   By: Aletta Edouard M.D.   On: 04/12/2021 16:14    Scheduled Meds:  aspirin EC  81 mg Oral q morning   budesonide  0.25 mg Nebulization BID   busPIRone  5 mg Oral QHS   insulin aspart  0-15 Units Subcutaneous TID WC   insulin aspart  0-5 Units Subcutaneous QHS   lactulose  10 g Oral BID   memantine  10 mg Oral QHS   midodrine  5 mg Oral TID WC   potassium chloride  40 mEq Oral Daily   primidone  250 mg Oral BH-qamhs   sertraline  75 mg Oral QHS   Continuous Infusions:  sodium chloride 75 mL/hr at 04/13/21 0518   cefTRIAXone (ROCEPHIN)  IV Stopped (04/13/21 0007)     LOS: 3 days   Marylu Lund, MD [facility] Pager On Amion  If 7PM-7AM, please contact night-coverage 04/13/2021, 3:28 PM

## 2021-04-13 NOTE — Care Management Important Message (Signed)
Important Message  Patient Details  Name: Carrie [name] MRN: [id number] Date of Birth: January 07, 1956   Medicare Important Message Given:  Yes     Orbie Pyo 04/13/2021, 2:51 PM

## 2021-04-13 NOTE — Progress Notes (Signed)
[facility] Progress Note  Subjective: UOP 650 overnight , creat down 4.6, pt remains confused, not tremulous today  Vitals:   04/12/21 2355 04/13/21 0351 04/13/21 0901 04/13/21 1136  BP: 131/71 127/71  (!) 158/78  Pulse: 74 72  74  Resp: 16 18  18   Temp: (!) 97.5 F (36.4 C) 97.6 F (36.4 C)  97.9 F (36.6 C)  TempSrc: Oral Oral  Oral  SpO2: 98% 98% 98% 98%  Weight:  62.5 kg    Height:        Exam:  alert, nad   no jvd  Chest cta bilat  Cor reg no RG  Abd soft ntnd no ascites   Ext no LE edema   Alert, NF, tremors and asterixis resolved, O x 1.5, follows commands and is pleasant and awake      UA prot 100, >50 wbc, 6-10 rbc     UNa 53,  UCr 56       Assessment/ Plan: AKI - baseline creat from May 2021 is 0.89. Here w/ hypotension, UTI/ pyelo and vol depletion. UA looks infected. Urine lytes c/w ATN, not HRS.  UOP improving w/ IVF"s today. Creat 4.7 on admit, peaked at 5.4 on 6/29 and is down to 4.6 today w/ improving UOP and exam. Will hold off on dialysis today, follow MS closely, it should cont to improve w/ improving renal function. Have explained to family. Will follow.  H/o cirrhosis - no sig symptoms Vol depletion - cont IVF's but lower to 75 cc/hr. Hypotension - d/t infection and vol depletion, better DM2     Rob Marquasia Schmieder 04/13/2021, 12:30 PM   Recent Labs  Lab 04/12/21 0454 04/13/21 0511  K 4.2 3.9  BUN 66* 55*  CREATININE 5.41* 4.63*  CALCIUM 7.0* 7.4*  HGB 9.7* 9.7*   Inpatient medications:  aspirin EC  81 mg Oral q morning   budesonide  0.25 mg Nebulization BID   busPIRone  5 mg Oral QHS   Chlorhexidine Gluconate Cloth  6 each Topical Q0600   insulin aspart  0-15 Units Subcutaneous TID WC   insulin aspart  0-5 Units Subcutaneous QHS   lactulose  10 g Oral BID   memantine  10 mg Oral QHS   midodrine  5 mg Oral TID WC   potassium chloride  40 mEq Oral Daily   primidone  250 mg Oral BH-qamhs   sertraline  75 mg Oral QHS     sodium chloride 75 mL/hr at 04/13/21 0518   cefTRIAXone (ROCEPHIN)  IV Stopped (04/13/21 0007)   acetaminophen **OR** acetaminophen, albuterol, chlorproMAZINE, heparin, HYDROcodone-acetaminophen, ondansetron **OR** ondansetron (ZOFRAN) IV

## 2021-04-14 LAB — BASIC METABOLIC PANEL
Anion gap: 9 (ref 5–15)
BUN: 47 mg/dL — ABNORMAL HIGH (ref 8–23)
CO2: 12 mmol/L — ABNORMAL LOW (ref 22–32)
Calcium: 8.4 mg/dL — ABNORMAL LOW (ref 8.9–10.3)
Chloride: 119 mmol/L — ABNORMAL HIGH (ref 98–111)
Creatinine, Ser: 3.55 mg/dL — ABNORMAL HIGH (ref 0.44–1.00)
GFR, Estimated: 14 mL/min — ABNORMAL LOW (ref >60)
Glucose, Bld: 113 mg/dL — ABNORMAL HIGH (ref 70–99)
Potassium: 4.2 mmol/L (ref 3.5–5.1)
Sodium: 140 mmol/L (ref 135–145)

## 2021-04-14 LAB — GLUCOSE, CAPILLARY
Glucose-Capillary: 104 mg/dL — ABNORMAL HIGH (ref 70–99)
Glucose-Capillary: 111 mg/dL — ABNORMAL HIGH (ref 70–99)
Glucose-Capillary: 112 mg/dL — ABNORMAL HIGH (ref 70–99)
Glucose-Capillary: 132 mg/dL — ABNORMAL HIGH (ref 70–99)

## 2021-04-14 LAB — AMMONIA: Ammonia: 22 umol/L (ref 9–35)

## 2021-04-14 MED ORDER — HYDRALAZINE HCL 20 MG/ML IJ SOLN
10.0000 mg | INTRAMUSCULAR | Status: DC | PRN
  Administered 2021-04-14 – 2021-04-17 (×4): 10 mg via INTRAVENOUS
  Filled 2021-04-14 (×4): qty 1

## 2021-04-14 MED ORDER — HYDRALAZINE HCL 25 MG PO TABS
25.0000 mg | ORAL_TABLET | Freq: Three times a day (TID) | ORAL | Status: DC
  Administered 2021-04-14 – 2021-04-17 (×9): 25 mg via ORAL
  Filled 2021-04-14 (×8): qty 1

## 2021-04-14 MED ORDER — LACTULOSE 10 GM/15ML PO SOLN
20.0000 g | Freq: Three times a day (TID) | ORAL | Status: DC
  Administered 2021-04-14 – 2021-04-15 (×5): 20 g via ORAL
  Filled 2021-04-14 (×5): qty 30

## 2021-04-14 MED ORDER — CHLORHEXIDINE GLUCONATE CLOTH 2 % EX PADS
6.0000 | MEDICATED_PAD | Freq: Every day | CUTANEOUS | Status: DC
  Administered 2021-04-14: 6 via TOPICAL

## 2021-04-14 NOTE — Progress Notes (Signed)
PROGRESS NOTE    Carrie [name]  VXB:[id number] DOB: [birth year] DOA: 04/10/2021 PCP: Pcp, No    Brief Narrative:  65 year old female with history of hypertension, hyperlipidemia, diabetes mellitus type 2, cirrhosis of liver presented with dizziness, weakness, dysuria and increased frequency.  On presentation, she was found to have acute kidney injury along with acute pyelonephritis based on CT findings along with leukocytosis.  She was started on IV fluids and antibiotics.  Assessment & Plan:   Active Problems:   Pyelonephritis   Pressure injury of skin  Acute kidney injury Acute metabolic acidosis Acute metabolic encephalopathy -Presented with creatinine of 4.76; creatinine has continued to get worse and is 5.41 today.  Urine output is not that good.  Renal ultrasound was negative for hydronephrosis. -Pt had been given IVF hydration -Noted to be recently confused and has some asterixis: Concerns for uremia.   -Pt was transferred to [facility] for potential dialysis. 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  LFTs have improved.  Outpatient follow-up with GI -Repeat ammonia 22 -see below, pt is encephalopathic -Will increase lactulose    Hypokalemia -normalized  Hyponatremia -Normalized.  Currently on IV fluids.  Monitor   Thrombocytopenia -noted to be normalized   Leukocytosis -Resolved   Possible anemia of chronic disease -From chronic illnesses.  Hemoglobin stable.  No signs of bleeding.   -Cont to monitor  Essential hypertension -Olmesartan and amlodipine on hold.  -Pt had been on midodrine for hypotension initially -BP now poorly controlled. Will hold midodrine -Have started scheduled hydralazine  Reported memory deficit -Continued on namenda   Psoriatic arthritis/chronic pain -Outpatient follow-up    Essential tremor -Continue primidone    Anxiety/depression -Continue buspirone, sertraline -Seems stable   Diabetes mellitus type II -Continue sliding scale insulin   History of mesenteric artery stenosis -Continue aspirin.  Statin held on presentation due to elevated LFTs.  Can possibly resume on discharge.    DVT prophylaxis: SCD's Code Status: Full Family Communication: Pt in room, family currently at bedside  Status is: Inpatient  Remains inpatient appropriate because:Inpatient level of care appropriate due to severity of illness  Dispo: The patient is from: Home              Anticipated d/c is to: Home              Patient currently is not medically stable to d/c.   Difficult to place patient No       Consultants:  nephrology  Procedures:    Antimicrobials: Anti-infectives (From admission, onward)    Start     Dose/Rate Route Frequency Ordered Stop   04/12/21 2200  cefTRIAXone (ROCEPHIN) 1 g in sodium chloride 0.9 % 100 mL IVPB        1 g 200 mL/hr over 30 Minutes Intravenous Every 24 hours 04/12/21 0906     04/10/21 2200  ceFEPIme (MAXIPIME) 1 g in sodium chloride 0.9 % 100 mL IVPB  Status:  Discontinued        1 g 200 mL/hr over 30 Minutes Intravenous Every 24 hours 04/10/21 1700 04/12/21 0906   04/10/21 1200  vancomycin (VANCOCIN) IVPB 1000 mg/200 mL premix        1,000 mg 200 mL/hr over 60 Minutes Intravenous  Once 04/10/21 1148 04/10/21 1306   04/10/21 1145  piperacillin-tazobactam (ZOSYN) IVPB 3.375 g  3.375 g 100 mL/hr over 30 Minutes Intravenous STAT 04/10/21 1138 04/10/21 1211       Subjective: Feels tired this afternoon  Objective: Vitals:   04/14/21 0345 04/14/21 0450 04/14/21 0856 04/14/21 1238  BP: (!) 170/78 (!) 157/86 (!) 179/85 (!) 186/81  Pulse: 76 83 86 81  Resp: 16  18 16   Temp: (!) 97.5 F (36.4 C)  98.2 F (36.8 C)   TempSrc: Oral  Oral   SpO2: 99%  100%   Weight: 73.5 kg     Height:        Intake/Output Summary (Last 24 hours) at 04/14/2021 1534 Last  data filed at 04/14/2021 0700 Gross per 24 hour  Intake 1683.33 ml  Output 901 ml  Net 782.33 ml    Filed Weights   04/12/21 2258 04/13/21 0351 04/14/21 0345  Weight: 73.8 kg 62.5 kg 73.5 kg    Examination: General exam: Conversant, in no acute distress Respiratory system: normal chest rise, clear, no audible wheezing Cardiovascular system: regular rhythm, s1-s2 Gastrointestinal system: Nondistended, nontender, pos BS Central nervous system: No seizures, no tremors Extremities: No cyanosis, no joint deformities Skin: No rashes, no pallor Psychiatry: Affect normal // no auditory hallucinations   Data Reviewed: I have personally reviewed following labs and imaging studies  CBC: Recent Labs  Lab 04/10/21 1043 04/11/21 0437 04/12/21 0454 04/13/21 0511  WBC 12.7* 7.6 10.7* 6.9  NEUTROABS 10.5*  --   --  3.9  HGB 11.5* 9.3* 9.7* 9.7*  HCT 34.2* 28.5* 30.6* 30.4*  MCV 88.8 89.9 93.0 91.3  PLT 144* 130* 194 224    Basic Metabolic Panel: Recent Labs  Lab 04/10/21 1640 04/11/21 0437 04/11/21 1415 04/12/21 0454 04/13/21 0511 04/14/21 0500  NA  --  131* 131* 133* 135 140  K  --  3.2* 3.9 4.2 3.9 4.2  CL  --  100 103 109 112* 119*  CO2  --  18* 17* 13* 11* 12*  GLUCOSE  --  131* 130* 118* 109* 113*  BUN  --  54* 59* 66* 55* 47*  CREATININE  --  5.02* 5.22* 5.41* 4.63* 3.55*  CALCIUM  --  7.1* 7.0* 7.0* 7.4* 8.4*  MG 1.6* 2.1  --   --  1.9  --     GFR: Estimated Creatinine Clearance: 14.5 mL/min (A) (by C-G formula based on SCr of 3.55 mg/dL (H)). Liver Function Tests: Recent Labs  Lab 04/10/21 1043 04/11/21 0437 04/12/21 0454 04/13/21 0511  AST 101* 44* 32 35  ALT 43 26 22 22   ALKPHOS 83 62 74 73  BILITOT 1.3* 1.0 0.9 0.6  PROT 7.9 6.2* 6.2* 6.0*  ALBUMIN 3.3* 2.5* 2.3* 2.0*    No results for input(s): LIPASE, AMYLASE in the last 168 hours. Recent Labs  Lab 04/11/21 1415 04/14/21 1425  AMMONIA 18 22    Coagulation Profile: Recent Labs  Lab  04/10/21 1043 04/11/21 0437  INR 1.1 1.2    Cardiac Enzymes: No results for input(s): CKTOTAL, CKMB, CKMBINDEX, TROPONINI in the last 168 hours. BNP (last 3 results) No results for input(s): PROBNP in the last 8760 hours. HbA1C: No results for input(s): HGBA1C in the last 72 hours. CBG: Recent Labs  Lab 04/13/21 1135 04/13/21 1632 04/13/21 2123 04/14/21 0553 04/14/21 1117  GLUCAP 120* 94 93 104* 111*    Lipid Profile: No results for input(s): CHOL, HDL, LDLCALC, TRIG, CHOLHDL, LDLDIRECT in the last 72 hours. Thyroid Function Tests: Recent Labs    04/13/21  0511  TSH 2.685    Anemia Panel: Recent Labs    04/13/21 0511  VITAMINB12 228  FOLATE 9.1    Sepsis Labs: Recent Labs  Lab 04/10/21 1038 04/11/21 0437  PROCALCITON  --  7.23  LATICACIDVEN 1.8  --      Recent Results (from the past 240 hour(s))  Blood culture (routine single)     Status: None (Preliminary result)   Collection Time: 04/10/21 10:53 AM   Specimen: BLOOD  Result Value Ref Range Status   Specimen Description   Final    BLOOD RIGHT ANTECUBITAL Performed at [facility], [place] [address] [place], [place] [address]    Special Requests   Final    BOTTLES DRAWN AEROBIC AND ANAEROBIC Blood Culture results may not be optimal due to an excessive volume of blood received in culture bottles Performed at [facility] [address], [place], [place] [address]    Culture   Final    NO GROWTH 4 DAYS Performed at [facility], [place] [address] [place], [place] [address]    Report Status PENDING  Incomplete  Resp Panel by RT-PCR (Flu A&B, Covid) Nasopharyngeal Swab     Status: None   Collection Time: 04/10/21 11:21 AM   Specimen: Nasopharyngeal Swab; Nasopharyngeal(NP) swabs in vial transport medium  Result Value Ref Range Status   SARS Coronavirus 2 by RT PCR NEGATIVE NEGATIVE Final    Comment: (NOTE) SARS-CoV-2 target nucleic acids are NOT  DETECTED.  The SARS-CoV-2 RNA is generally detectable in upper respiratory specimens during the acute phase of infection. The lowest concentration of SARS-CoV-2 viral copies this assay can detect is 138 copies/mL. A negative result does not preclude SARS-Cov-2 infection and should not be used as the sole basis for treatment or other patient management decisions. A negative result may occur with  improper specimen collection/handling, submission of specimen other than nasopharyngeal swab, presence of viral mutation(s) within the areas targeted by this assay, and inadequate number of viral copies(<138 copies/mL). A negative result must be combined with clinical observations, patient history, and epidemiological information. The expected result is Negative.  Fact Sheet for Patients:  EntrepreneurPulse.com.au  Fact Sheet for Healthcare Providers:  IncredibleEmployment.be  This test is no t yet approved or cleared by the [place] FDA and  has been authorized for detection and/or diagnosis of SARS-CoV-2 by FDA under an Emergency Use Authorization (EUA). This EUA will remain  in effect (meaning this test can be used) for the duration of the COVID-19 declaration under Section 564(b)(1) of the Act, 21 U.S.C.section 360bbb-3(b)(1), unless the authorization is terminated  or revoked sooner.       Influenza A by PCR NEGATIVE NEGATIVE Final   Influenza B by PCR NEGATIVE NEGATIVE Final    Comment: (NOTE) The Xpert Xpress SARS-CoV-2/FLU/RSV plus assay is intended as an aid in the diagnosis of influenza from Nasopharyngeal swab specimens and should not be used as a sole basis for treatment. Nasal washings and aspirates are unacceptable for Xpert Xpress SARS-CoV-2/FLU/RSV testing.  Fact Sheet for Patients: EntrepreneurPulse.com.au  Fact Sheet for Healthcare Providers: IncredibleEmployment.be  This test is not yet  approved or cleared by the [place] FDA and has been authorized for detection and/or diagnosis of SARS-CoV-2 by FDA under an Emergency Use Authorization (EUA). This EUA will remain in effect (meaning this test can be used) for the duration of the COVID-19 declaration under Section 564(b)(1) of the Act, 21 U.S.C. section 360bbb-3(b)(1),  unless the authorization is terminated or revoked.  Performed at [facility], [place] [address] [place], [place] [address]   Urine culture     Status: Abnormal   Collection Time: 04/10/21  2:35 PM   Specimen: In/Out Cath Urine  Result Value Ref Range Status   Specimen Description   Final    IN/OUT CATH URINE Performed at [facility] [address] [place], [place] [address]    Special Requests   Final    NONE Performed at [facility], [place] [address] [place], [place] [address]    Culture >=100,000 COLONIES/mL KLEBSIELLA PNEUMONIAE (A)  Final   Report Status 04/13/2021 FINAL  Final   Organism ID, Bacteria KLEBSIELLA PNEUMONIAE (A)  Final      Susceptibility   Klebsiella pneumoniae - MIC*    AMPICILLIN RESISTANT Resistant     CEFAZOLIN <=4 SENSITIVE Sensitive     CEFEPIME <=0.12 SENSITIVE Sensitive     CEFTRIAXONE <=0.25 SENSITIVE Sensitive     CIPROFLOXACIN <=0.25 SENSITIVE Sensitive     GENTAMICIN <=1 SENSITIVE Sensitive     IMIPENEM <=0.25 SENSITIVE Sensitive     NITROFURANTOIN <=16 SENSITIVE Sensitive     TRIMETH/SULFA <=20 SENSITIVE Sensitive     AMPICILLIN/SULBACTAM <=2 SENSITIVE Sensitive     PIP/TAZO <=4 SENSITIVE Sensitive     * >=100,000 COLONIES/mL KLEBSIELLA PNEUMONIAE      Radiology Studies: No results found.  Scheduled Meds:  aspirin EC  81 mg Oral q morning   busPIRone  5 mg Oral QHS   Chlorhexidine Gluconate Cloth  6 each Topical Daily   hydrALAZINE  25 mg Oral Q8H   insulin aspart  0-15 Units Subcutaneous TID WC   insulin aspart  0-5 Units Subcutaneous  QHS   lactulose  10 g Oral BID   memantine  10 mg Oral QHS   potassium chloride  40 mEq Oral Daily   primidone  250 mg Oral BH-qamhs   sertraline  75 mg Oral QHS   Continuous Infusions:  sodium chloride 50 mL/hr at 04/14/21 1429   cefTRIAXone (ROCEPHIN)  IV 1 g (04/13/21 2057)     LOS: 4 days   Marylu Lund, MD [facility] Pager On Amion  If 7PM-7AM, please contact night-coverage 04/14/2021, 3:34 PM

## 2021-04-14 NOTE — Progress Notes (Addendum)
MD notified via Amion of pt being sleepy this am as well as having a decreased appetite. SBP 179, paged to see if pt still needed her midodrine. Per MD ok to hold. Pt also nauseous & had 1 small episode of emesis this am while attempted to eat breakfast. MD aware. 04/14/21 at 8:58 am  MD notified of pt's bp still being elevated after PO hydralazine as well as a slight fever of 99. New orders for IV hydralazine. 04/14/21 at 1635

## 2021-04-14 NOTE — Plan of Care (Signed)
  Problem: Clinical Measurements: Goal: Will remain free from infection Outcome: Completed/Met

## 2021-04-14 NOTE — Progress Notes (Signed)
MD [place] Crane called as pt's bp Is still elevated in the 524Z systolic after PO & IV hydralazine. Pt also has slight fever 99 & had another episode of emesis w/o nausea. Pt has hx of esophageal stricture but unable to tell nurse when she had it repaired or the last time she's seen the doctor about it. Per MD recheck pt's bp & if she's still in the 180s give another dose of the IV hydralazine & update her. Info passed to family,pt & new RN. Pt was not in any distress. Hx of cirrhosis as well so holding off on tylenol for now.

## 2021-04-14 NOTE — Progress Notes (Signed)
Carrie [name] Progress Note  Subjective: UOP continues to improve , 1.3 L UOP yest, creat down to 3.5 today, pt still a bit confused  Vitals:   04/14/21 0345 04/14/21 0450 04/14/21 0856 04/14/21 1238  BP: (!) 170/78 (!) 157/86 (!) 179/85 (!) 186/81  Pulse: 76 83 86 81  Resp: 16  18 16   Temp: (!) 97.5 F (36.4 C)  98.2 F (36.8 C)   TempSrc: Oral  Oral   SpO2: 99%  100%   Weight: 73.5 kg     Height:        Exam:  alert, nad   no jvd  Chest cta bilat  Cor reg no RG  Abd soft ntnd no ascites   Ext no LE edema   Alert, NF, tremors and asterixis resolved, O x 1.5, follows commands and is pleasant and awake      UA prot 100, >50 wbc, 6-10 rbc     UNa 53,  UCr 56       Assessment/ Plan: AKI - baseline creat from May 2021 is 0.89. Here w/ hypotension, UTI/ pyelo and vol depletion. UA looks infected. Urine lytes c/w ATN, not HRS.  UOP improving w/ IVF"s today. Creat 4.7 on admit, peaked at 5.4 on 6/29 w/ confusion and asterixis/ uremia. Moved to [facility] and temp HD cath placed. However w/ IVF's, creat has improved to 4.5 yest and 3.5 today. Asterixis has resolved, no need for HD at this time. Will lower IVF"s and f/u creat in am. If continues to improve w/ pull HD catheter tomorrow.  AMS - pt was uremic but resolving w/ improving renal function. Consider other causes too.  H/o cirrhosis - no sig symptoms Vol depletion - cont IVF's lower to 50 cc/hr.  Hypotension - d/t infection and vol depletion, resolved DM2     Rob Halli Equihua 04/14/2021, 2:06 PM   Recent Labs  Lab 04/12/21 0454 04/13/21 0511 04/14/21 0500  K 4.2 3.9 4.2  BUN 66* 55* 47*  CREATININE 5.41* 4.63* 3.55*  CALCIUM 7.0* 7.4* 8.4*  HGB 9.7* 9.7*  --     Inpatient medications:  aspirin EC  81 mg Oral q morning   busPIRone  5 mg Oral QHS   Chlorhexidine Gluconate Cloth  6 each Topical Daily   hydrALAZINE  25 mg Oral Q8H   insulin aspart  0-15 Units Subcutaneous TID WC   insulin aspart  0-5 Units  Subcutaneous QHS   lactulose  10 g Oral BID   memantine  10 mg Oral QHS   potassium chloride  40 mEq Oral Daily   primidone  250 mg Oral BH-qamhs   sertraline  75 mg Oral QHS    sodium chloride 75 mL/hr at 04/14/21 0843   cefTRIAXone (ROCEPHIN)  IV 1 g (04/13/21 2057)   acetaminophen **OR** acetaminophen, albuterol, chlorproMAZINE, HYDROcodone-acetaminophen, ondansetron **OR** ondansetron (ZOFRAN) IV

## 2021-04-15 LAB — BASIC METABOLIC PANEL
Anion gap: 15 (ref 5–15)
BUN: 35 mg/dL — ABNORMAL HIGH (ref 8–23)
CO2: 12 mmol/L — ABNORMAL LOW (ref 22–32)
Calcium: 9.1 mg/dL (ref 8.9–10.3)
Chloride: 115 mmol/L — ABNORMAL HIGH (ref 98–111)
Creatinine, Ser: 2.58 mg/dL — ABNORMAL HIGH (ref 0.44–1.00)
GFR, Estimated: 20 mL/min — ABNORMAL LOW (ref >60)
Glucose, Bld: 125 mg/dL — ABNORMAL HIGH (ref 70–99)
Potassium: 3.9 mmol/L (ref 3.5–5.1)
Sodium: 142 mmol/L (ref 135–145)

## 2021-04-15 LAB — GLUCOSE, CAPILLARY
Glucose-Capillary: 115 mg/dL — ABNORMAL HIGH (ref 70–99)
Glucose-Capillary: 133 mg/dL — ABNORMAL HIGH (ref 70–99)
Glucose-Capillary: 116 mg/dL — ABNORMAL HIGH (ref 70–99)
Glucose-Capillary: 131 mg/dL — ABNORMAL HIGH (ref 70–99)

## 2021-04-15 LAB — CULTURE, BLOOD (SINGLE): Culture: NO GROWTH

## 2021-04-15 MED ORDER — CEPHALEXIN 250 MG PO CAPS
500.0000 mg | ORAL_CAPSULE | Freq: Two times a day (BID) | ORAL | Status: AC
  Administered 2021-04-15 – 2021-04-16 (×3): 500 mg via ORAL
  Filled 2021-04-15 (×3): qty 2

## 2021-04-15 MED ORDER — STERILE WATER FOR INJECTION IV SOLN
INTRAVENOUS | Status: DC
  Filled 2021-04-15: qty 1000

## 2021-04-15 NOTE — Progress Notes (Signed)
Pt on sodium bicarb infusion, lost  her IV access.. IV team unable to place new IV. Another team will try later. Currently nor receiving infusion. MD Aware

## 2021-04-15 NOTE — Plan of Care (Signed)

## 2021-04-15 NOTE — Progress Notes (Signed)
PROGRESS NOTE    Carrie [name]  JJH:[id number] DOB: [birth year] DOA: 04/10/2021 PCP: Pcp, No    Brief Narrative:  65 year old female with history of hypertension, hyperlipidemia, diabetes mellitus type 2, cirrhosis of liver presented with dizziness, weakness, dysuria and increased frequency.  On presentation, she was found to have acute kidney injury along with acute pyelonephritis based on CT findings along with leukocytosis.  She was started on IV fluids and antibiotics.  Assessment & Plan:   Active Problems:   Pyelonephritis   Pressure injury of skin  Acute kidney injury Acute metabolic acidosis Acute metabolic encephalopathy -Presented with creatinine of 4.76; creatinine has continued to get worse and is 5.41 today.  Urine output is not that good.  Renal ultrasound was negative for hydronephrosis. -Pt had been given IVF hydration -Recently noted to be more confused with concerns of uremia -Initially transferred to [facility] for potential dialysis. 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  LFTs have improved.  Outpatient follow-up with GI -Recent ammonia 22, which was increased from 18 on 6/28 -Lactulose dose increased to ensure 2-3 BM daily   Hypokalemia -normalized  Hyponatremia -Normalized.  Currently on IV fluids.  Monitor   Thrombocytopenia -noted to be normalized   Leukocytosis -Resolved   Possible anemia of chronic disease -From chronic illnesses.  Hemoglobin stable.  No signs of bleeding.   -Cont to monitor  Essential hypertension -Olmesartan and amlodipine on hold.  -Pt had been on midodrine for hypotension initially -BP now poorly controlled. Will hold midodrine -Have started scheduled hydralazine  Reported memory deficit -Pt is continued on namenda   Psoriatic arthritis/chronic pain -Outpatient follow-up    Essential tremor -Continue primidone    Anxiety/depression -Continue buspirone, sertraline -Seems stable   Diabetes mellitus type II -Continue sliding scale insulin as needed   History of mesenteric artery stenosis -Continue aspirin.  Statin held on presentation due to elevated LFTs.  Can possibly resume on discharge.    DVT prophylaxis: SCD's Code Status: Full Family Communication: Pt in room, family currently at bedside  Status is: Inpatient  Remains inpatient appropriate because:Inpatient level of care appropriate due to severity of illness  Dispo: The patient is from: Home              Anticipated d/c is to: Home              Patient currently is not medically stable to d/c.   Difficult to place patient No       Consultants:  nephrology  Procedures:    Antimicrobials: Anti-infectives (From admission, onward)    Start     Dose/Rate Route Frequency Ordered Stop   04/15/21 2200  cephALEXin (KEFLEX) capsule 500 mg        500 mg Oral Every 12 hours 04/15/21 1244 04/17/21 0959   04/12/21 2200  cefTRIAXone (ROCEPHIN) 1 g in sodium chloride 0.9 % 100 mL IVPB  Status:  Discontinued        1 g 200 mL/hr over 30 Minutes Intravenous Every 24 hours 04/12/21 0906 04/15/21 1244   04/10/21 2200  ceFEPIme (MAXIPIME) 1 g in sodium chloride 0.9 % 100 mL IVPB  Status:  Discontinued        1 g 200 mL/hr over 30 Minutes Intravenous Every 24 hours 04/10/21 1700 04/12/21 0906   04/10/21 1200  vancomycin (VANCOCIN) IVPB 1000 mg/200 mL premix  1,000 mg 200 mL/hr over 60 Minutes Intravenous  Once 04/10/21 1148 04/10/21 1306   04/10/21 1145  piperacillin-tazobactam (ZOSYN) IVPB 3.375 g        3.375 g 100 mL/hr over 30 Minutes Intravenous STAT 04/10/21 1138 04/10/21 1211       Subjective: Reports feeling tired this AM  Objective: Vitals:   04/15/21 0450 04/15/21 0627 04/15/21 0719 04/15/21 1135  BP: (!) 187/87 (!) 164/87 (!) 158/90 (!) 184/86  Pulse: 83 (!) 103 98 94  Resp: 18 18 17 16   Temp: 98 F (36.7 C)  98.7 F (37.1 C) 98.5 F (36.9 C) 98.1 F (36.7 C)  TempSrc: Oral  Oral Oral  SpO2: 98% 99% 98% 99%  Weight:      Height:        Intake/Output Summary (Last 24 hours) at 04/15/2021 1420 Last data filed at 04/15/2021 1200 Gross per 24 hour  Intake 2094.78 ml  Output 4085 ml  Net -1990.22 ml    Filed Weights   04/12/21 2258 04/13/21 0351 04/14/21 0345  Weight: 73.8 kg 62.5 kg 73.5 kg    Examination: General exam: Awake, laying in bed, in nad Respiratory system: Normal respiratory effort, no wheezing Cardiovascular system: regular rate, s1, s2 Gastrointestinal system: Soft, nondistended, positive BS Central nervous system: CN2-12 grossly intact, strength intact Extremities: Perfused, no clubbing Skin: Normal skin turgor, no notable skin lesions seen Psychiatry: Mood normal // no visual hallucinations   Data Reviewed: I have personally reviewed following labs and imaging studies  CBC: Recent Labs  Lab 04/10/21 1043 04/11/21 0437 04/12/21 0454 04/13/21 0511  WBC 12.7* 7.6 10.7* 6.9  NEUTROABS 10.5*  --   --  3.9  HGB 11.5* 9.3* 9.7* 9.7*  HCT 34.2* 28.5* 30.6* 30.4*  MCV 88.8 89.9 93.0 91.3  PLT 144* 130* 194 166    Basic Metabolic Panel: Recent Labs  Lab 04/10/21 1640 04/11/21 0437 04/11/21 1415 04/12/21 0454 04/13/21 0511 04/14/21 0500 04/15/21 0335  NA  --  131* 131* 133* 135 140 142  K  --  3.2* 3.9 4.2 3.9 4.2 3.9  CL  --  100 103 109 112* 119* 115*  CO2  --  18* 17* 13* 11* 12* 12*  GLUCOSE  --  131* 130* 118* 109* 113* 125*  BUN  --  54* 59* 66* 55* 47* 35*  CREATININE  --  5.02* 5.22* 5.41* 4.63* 3.55* 2.58*  CALCIUM  --  7.1* 7.0* 7.0* 7.4* 8.4* 9.1  MG 1.6* 2.1  --   --  1.9  --   --     GFR: Estimated Creatinine Clearance: 20 mL/min (A) (by C-G formula based on SCr of 2.58 mg/dL (H)). Liver Function Tests: Recent Labs  Lab 04/10/21 1043 04/11/21 0437 04/12/21 0454 04/13/21 0511  AST 101* 44* 32 35  ALT 43 26 22 22   ALKPHOS 83 62 74  73  BILITOT 1.3* 1.0 0.9 0.6  PROT 7.9 6.2* 6.2* 6.0*  ALBUMIN 3.3* 2.5* 2.3* 2.0*    No results for input(s): LIPASE, AMYLASE in the last 168 hours. Recent Labs  Lab 04/11/21 1415 04/14/21 1425  AMMONIA 18 22    Coagulation Profile: Recent Labs  Lab 04/10/21 1043 04/11/21 0437  INR 1.1 1.2    Cardiac Enzymes: No results for input(s): CKTOTAL, CKMB, CKMBINDEX, TROPONINI in the last 168 hours. BNP (last 3 results) No results for input(s): PROBNP in the last 8760 hours. HbA1C: No results for input(s): HGBA1C in the last  72 hours. CBG: Recent Labs  Lab 04/14/21 1117 04/14/21 1618 04/14/21 2114 04/15/21 0620 04/15/21 1142  GLUCAP 111* 112* 132* 115* 133*    Lipid Profile: No results for input(s): CHOL, HDL, LDLCALC, TRIG, CHOLHDL, LDLDIRECT in the last 72 hours. Thyroid Function Tests: Recent Labs    04/13/21 0511  TSH 2.685    Anemia Panel: Recent Labs    04/13/21 0511  VITAMINB12 228  FOLATE 9.1    Sepsis Labs: Recent Labs  Lab 04/10/21 1038 04/11/21 0437  PROCALCITON  --  7.23  LATICACIDVEN 1.8  --      Recent Results (from the past 240 hour(s))  Blood culture (routine single)     Status: None   Collection Time: 04/10/21 10:53 AM   Specimen: BLOOD  Result Value Ref Range Status   Specimen Description   Final    BLOOD RIGHT ANTECUBITAL Performed at [facility] [address] [place], [place] [address]    Special Requests   Final    BOTTLES DRAWN AEROBIC AND ANAEROBIC Blood Culture results may not be optimal due to an excessive volume of blood received in culture bottles Performed at [facility] [address], [place], [place] [address]    Culture   Final    NO GROWTH 5 DAYS Performed at [facility], [place] [address] [place], [place] [address]    Report Status 04/15/2021 FINAL  Final  Resp Panel by RT-PCR (Flu A&B, Covid) Nasopharyngeal Swab     Status: None   Collection Time: 04/10/21  11:21 AM   Specimen: Nasopharyngeal Swab; Nasopharyngeal(NP) swabs in vial transport medium  Result Value Ref Range Status   SARS Coronavirus 2 by RT PCR NEGATIVE NEGATIVE Final    Comment: (NOTE) SARS-CoV-2 target nucleic acids are NOT DETECTED.  The SARS-CoV-2 RNA is generally detectable in upper respiratory specimens during the acute phase of infection. The lowest concentration of SARS-CoV-2 viral copies this assay can detect is 138 copies/mL. A negative result does not preclude SARS-Cov-2 infection and should not be used as the sole basis for treatment or other patient management decisions. A negative result may occur with  improper specimen collection/handling, submission of specimen other than nasopharyngeal swab, presence of viral mutation(s) within the areas targeted by this assay, and inadequate number of viral copies(<138 copies/mL). A negative result must be combined with clinical observations, patient history, and epidemiological information. The expected result is Negative.  Fact Sheet for Patients:  EntrepreneurPulse.com.au  Fact Sheet for Healthcare Providers:  IncredibleEmployment.be  This test is no t yet approved or cleared by the [place] FDA and  has been authorized for detection and/or diagnosis of SARS-CoV-2 by FDA under an Emergency Use Authorization (EUA). This EUA will remain  in effect (meaning this test can be used) for the duration of the COVID-19 declaration under Section 564(b)(1) of the Act, 21 U.S.C.section 360bbb-3(b)(1), unless the authorization is terminated  or revoked sooner.       Influenza A by PCR NEGATIVE NEGATIVE Final   Influenza B by PCR NEGATIVE NEGATIVE Final    Comment: (NOTE) The Xpert Xpress SARS-CoV-2/FLU/RSV plus assay is intended as an aid in the diagnosis of influenza from Nasopharyngeal swab specimens and should not be used as a sole basis for treatment. Nasal washings and aspirates  are unacceptable for Xpert Xpress SARS-CoV-2/FLU/RSV testing.  Fact Sheet for Patients: EntrepreneurPulse.com.au  Fact Sheet for Healthcare Providers: IncredibleEmployment.be  This test is not yet approved or cleared  by the [place] and has been authorized for detection and/or diagnosis of SARS-CoV-2 by FDA under an Emergency Use Authorization (EUA). This EUA will remain in effect (meaning this test can be used) for the duration of the COVID-19 declaration under Section 564(b)(1) of the Act, 21 U.S.C. section 360bbb-3(b)(1), unless the authorization is terminated or revoked.  Performed at [facility], [place] [address], [place], [place] [address]   Urine culture     Status: Abnormal   Collection Time: 04/10/21  2:35 PM   Specimen: In/Out Cath Urine  Result Value Ref Range Status   Specimen Description   Final    IN/OUT CATH URINE Performed at [facility] [address] [place], [place] [address]    Special Requests   Final    NONE Performed at [facility], [place] [address] [place],  [address]    Culture >=100,000 COLONIES/mL KLEBSIELLA PNEUMONIAE (A)  Final   Report Status 04/13/2021 FINAL  Final   Organism ID, Bacteria KLEBSIELLA PNEUMONIAE (A)  Final      Susceptibility   Klebsiella pneumoniae - MIC*    AMPICILLIN RESISTANT Resistant     CEFAZOLIN <=4 SENSITIVE Sensitive     CEFEPIME <=0.12 SENSITIVE Sensitive     CEFTRIAXONE <=0.25 SENSITIVE Sensitive     CIPROFLOXACIN <=0.25 SENSITIVE Sensitive     GENTAMICIN <=1 SENSITIVE Sensitive     IMIPENEM <=0.25 SENSITIVE Sensitive     NITROFURANTOIN <=16 SENSITIVE Sensitive     TRIMETH/SULFA <=20 SENSITIVE Sensitive     AMPICILLIN/SULBACTAM <=2 SENSITIVE Sensitive     PIP/TAZO <=4 SENSITIVE Sensitive     * >=100,000 COLONIES/mL KLEBSIELLA PNEUMONIAE      Radiology Studies: No results found.  Scheduled Meds:   aspirin EC  81 mg Oral q morning   busPIRone  5 mg Oral QHS   cephALEXin  500 mg Oral Q12H   Chlorhexidine Gluconate Cloth  6 each Topical Daily   hydrALAZINE  25 mg Oral Q8H   insulin aspart  0-15 Units Subcutaneous TID WC   insulin aspart  0-5 Units Subcutaneous QHS   lactulose  20 g Oral TID   memantine  10 mg Oral QHS   potassium chloride  40 mEq Oral Daily   primidone  250 mg Oral BH-qamhs   sertraline  75 mg Oral QHS   Continuous Infusions:   sodium bicarbonate (isotonic) infusion in sterile water 75 mL/hr at 04/15/21 1046     LOS: 5 days   Marylu Lund, MD [facility] Pager On Amion  If 7PM-7AM, please contact night-coverage 04/15/2021, 2:20 PM

## 2021-04-15 NOTE — Progress Notes (Signed)
[facility] PROGRESS NOTE  Assessment/ Plan: Pt is a 65 y.o. yo female with history of HTN, HLD, DM, liver cirrhosis who was presented for weakness, dysuria, dizziness, altered mental status seen as a consultation for acute kidney injury.  #Acute kidney injury, nonoliguric likely ischemic ATN due to hypotension/volume depletion concomitant with  UTI.  Kidney ultrasound unremarkable.  The HD catheter was placed and then patient was transferred to [facility] for possible need for dialysis.  Fortunately, patient has increasing urine output and creatinine level continue to improve to 2.58 from peak creatinine level of 5.41.  No need for dialysis therefore I will consult IV team to discontinue temporary HD catheter.  Discussed with the nurse.  She is on a small dose of IV fluid, I will continue this as he has poor oral intake.  #Acute metabolic encephalopathy: Some uremic component as well.  Reportedly the mental status is improving.  Continue to monitor.  #Urinary tract infection: On ceftriaxone and IV fluid.  #Hypokalemia: Potassium level improved.  #Metabolic acidosis but anion and non-anion gap.  I will change IV fluid with sodium bicarbonate.  Monitor lab.  Subjective: Seen and examined at bedside.  She has urine output of around 2.9 L.  She is alert awake and oriented to herself and hospital.  She denies any complaint however review of system is limited.  No other event. Objective Vital signs in last 24 hours: Vitals:   04/15/21 0019 04/15/21 0450 04/15/21 0627 04/15/21 0719  BP: (!) 177/92 (!) 187/87 (!) 164/87 (!) 158/90  Pulse: 89 83 (!) 103 98  Resp: 18 18 18 17   Temp: 98.1 F (36.7 C) 98 F (36.7 C) 98.7 F (37.1 C) 98.5 F (36.9 C)  TempSrc: Oral Oral  Oral  SpO2: (!) 87% 98% 99% 98%  Weight:      Height:       Weight change:   Intake/Output Summary (Last 24 hours) at 04/15/2021 0858 Last data filed at 04/15/2021 0800 Gross per 24 hour  Intake  2122.54 ml  Output 3400 ml  Net -1277.46 ml       Labs: Basic Metabolic Panel: Recent Labs  Lab 04/13/21 0511 04/14/21 0500 04/15/21 0335  NA 135 140 142  K 3.9 4.2 3.9  CL 112* 119* 115*  CO2 11* 12* 12*  GLUCOSE 109* 113* 125*  BUN 55* 47* 35*  CREATININE 4.63* 3.55* 2.58*  CALCIUM 7.4* 8.4* 9.1   Liver Function Tests: Recent Labs  Lab 04/11/21 0437 04/12/21 0454 04/13/21 0511  AST 44* 32 35  ALT 26 22 22   ALKPHOS 62 74 73  BILITOT 1.0 0.9 0.6  PROT 6.2* 6.2* 6.0*  ALBUMIN 2.5* 2.3* 2.0*   No results for input(s): LIPASE, AMYLASE in the last 168 hours. Recent Labs  Lab 04/11/21 1415 04/14/21 1425  AMMONIA 18 22   CBC: Recent Labs  Lab 04/10/21 1043 04/11/21 0437 04/12/21 0454 04/13/21 0511  WBC 12.7* 7.6 10.7* 6.9  NEUTROABS 10.5*  --   --  3.9  HGB 11.5* 9.3* 9.7* 9.7*  HCT 34.2* 28.5* 30.6* 30.4*  MCV 88.8 89.9 93.0 91.3  PLT 144* 130* 194 199   Cardiac Enzymes: No results for input(s): CKTOTAL, CKMB, CKMBINDEX, TROPONINI in the last 168 hours. CBG: Recent Labs  Lab 04/14/21 0553 04/14/21 1117 04/14/21 1618 04/14/21 2114 04/15/21 0620  GLUCAP 104* 111* 112* 132* 115*    Iron Studies: No results for input(s): IRON, TIBC, TRANSFERRIN, FERRITIN in the last  72 hours. Studies/Results: No results found.  Medications: Infusions:  sodium chloride 50 mL/hr at 04/15/21 0457   cefTRIAXone (ROCEPHIN)  IV 1 g (04/14/21 2106)    Scheduled Medications:  aspirin EC  81 mg Oral q morning   busPIRone  5 mg Oral QHS   Chlorhexidine Gluconate Cloth  6 each Topical Daily   hydrALAZINE  25 mg Oral Q8H   insulin aspart  0-15 Units Subcutaneous TID WC   insulin aspart  0-5 Units Subcutaneous QHS   lactulose  20 g Oral TID   memantine  10 mg Oral QHS   potassium chloride  40 mEq Oral Daily   primidone  250 mg Oral BH-qamhs   sertraline  75 mg Oral QHS    have reviewed scheduled and prn medications.  Physical Exam: General:NAD,  comfortable Heart:RRR, s1s2 nl Lungs:clear b/l, no crackle Abdomen:soft, Non-tender, non-distended Extremities:No edema Dialysis Access: Neck IJ temporary catheter.  Miamor Ayler Prasad Kelton Bultman 04/15/2021,8:58 AM  LOS: 5 days

## 2021-04-16 LAB — GLUCOSE, CAPILLARY
Glucose-Capillary: 118 mg/dL — ABNORMAL HIGH (ref 70–99)
Glucose-Capillary: 129 mg/dL — ABNORMAL HIGH (ref 70–99)
Glucose-Capillary: 92 mg/dL (ref 70–99)
Glucose-Capillary: 117 mg/dL — ABNORMAL HIGH (ref 70–99)

## 2021-04-16 LAB — BASIC METABOLIC PANEL
Anion gap: 11 (ref 5–15)
BUN: 26 mg/dL — ABNORMAL HIGH (ref 8–23)
CO2: 17 mmol/L — ABNORMAL LOW (ref 22–32)
Calcium: 9 mg/dL (ref 8.9–10.3)
Chloride: 116 mmol/L — ABNORMAL HIGH (ref 98–111)
Creatinine, Ser: 1.79 mg/dL — ABNORMAL HIGH (ref 0.44–1.00)
GFR, Estimated: 31 mL/min — ABNORMAL LOW (ref >60)
Glucose, Bld: 129 mg/dL — ABNORMAL HIGH (ref 70–99)
Potassium: 3.4 mmol/L — ABNORMAL LOW (ref 3.5–5.1)
Sodium: 144 mmol/L (ref 135–145)

## 2021-04-16 MED ORDER — SODIUM BICARBONATE 650 MG PO TABS
650.0000 mg | ORAL_TABLET | Freq: Three times a day (TID) | ORAL | Status: DC
  Administered 2021-04-16 – 2021-04-19 (×10): 650 mg via ORAL
  Filled 2021-04-16 (×10): qty 1

## 2021-04-16 MED ORDER — LACTULOSE 10 GM/15ML PO SOLN
20.0000 g | Freq: Two times a day (BID) | ORAL | Status: DC
  Administered 2021-04-16: 20 g via ORAL
  Filled 2021-04-16 (×2): qty 30

## 2021-04-16 NOTE — Progress Notes (Signed)
PROGRESS NOTE    Carrie [name]  FKC:[id number] DOB: February 11, 1956 DOA: 04/10/2021 PCP: Pcp, No    Brief Narrative:  65 year old female with history of hypertension, hyperlipidemia, diabetes mellitus type 2, cirrhosis of liver presented with dizziness, weakness, dysuria and increased frequency.  On presentation, she was found to have acute kidney injury along with acute pyelonephritis based on CT findings along with leukocytosis.  She was started on IV fluids and antibiotics.  Assessment & Plan:   Active Problems:   Pyelonephritis   Pressure injury of skin  Acute kidney injury Acute metabolic acidosis Acute metabolic encephalopathy -Presented with creatinine of 4.76; creatinine has continued to get worse and is 5.41 today.  Urine output is not that good.  Renal ultrasound was negative for hydronephrosis. -Initially transferred to [facility] for potential dialysis. 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  Recommend outpatient follow-up with GI -Recent ammonia 22, which was increased from 18 on 6/28 -Lactulose dose titrated to ensure 2-3 BM daily    Hypokalemia -corrected  Hyponatremia -Normalized.  Currently on IV fluids.  Monitor   Thrombocytopenia -noted to be normalized   Leukocytosis -Resolved   Possible anemia of chronic disease -From chronic illnesses.  Hemoglobin stable.  No signs of bleeding.   -Cont to monitor  Essential hypertension -Olmesartan and amlodipine on hold.  -Pt had been on midodrine for hypotension initially -BP now stable. Stopped midodrine -Have started scheduled hydralazine  Reported memory deficit -Pt is continued on namenda   Psoriatic arthritis/chronic pain -Outpatient follow-up    Essential tremor -Continue primidone   Anxiety/depression -Continue  buspirone, sertraline -Currently stable   Diabetes mellitus type II -Continue sliding scale insulin as needed   History of mesenteric artery stenosis -Continue aspirin.  Statin held on presentation due to elevated LFTs.  Can possibly resume on discharge.    DVT prophylaxis: SCD's Code Status: Full Family Communication: Pt in room, family currently at bedside  Status is: Inpatient  Remains inpatient appropriate because:Inpatient level of care appropriate due to severity of illness  Dispo: The patient is from: Home              Anticipated d/c is to: SNF              Patient currently is not medically stable to d/c.   Difficult to place patient No       Consultants:  nephrology  Procedures:    Antimicrobials: Anti-infectives (From admission, onward)    Start     Dose/Rate Route Frequency Ordered Stop   04/15/21 2200  cephALEXin (KEFLEX) capsule 500 mg        500 mg Oral Every 12 hours 04/15/21 1244 04/17/21 0959   04/12/21 2200  cefTRIAXone (ROCEPHIN) 1 g in sodium chloride 0.9 % 100 mL IVPB  Status:  Discontinued        1 g 200 mL/hr over 30 Minutes Intravenous Every 24 hours 04/12/21 0906 04/15/21 1244   04/10/21 2200  ceFEPIme (MAXIPIME) 1 g in sodium chloride 0.9 % 100 mL IVPB  Status:  Discontinued        1 g 200 mL/hr over 30 Minutes Intravenous Every 24 hours 04/10/21 1700 04/12/21 0906   04/10/21 1200  vancomycin (VANCOCIN) IVPB 1000 mg/200 mL premix        1,000 mg  200 mL/hr over 60 Minutes Intravenous  Once 04/10/21 1148 04/10/21 1306   04/10/21 1145  piperacillin-tazobactam (ZOSYN) IVPB 3.375 g        3.375 g 100 mL/hr over 30 Minutes Intravenous STAT 04/10/21 1138 04/10/21 1211       Subjective: Without complaints  Objective: Vitals:   04/15/21 1700 04/15/21 1917 04/16/21 0513 04/16/21 1100  BP: (!) 176/88 (!) 177/89 (!) 133/95 (!) 148/77  Pulse: 99 96 91 99  Resp: 17 19 19 18   Temp: 98.4 F (36.9 C) 98.3 F (36.8 C) 97.6 F (36.4 C) 97.9  F (36.6 C)  TempSrc: Oral   Oral  SpO2: 99% 99% 99% 99%  Weight:   64.4 kg   Height:        Intake/Output Summary (Last 24 hours) at 04/16/2021 1501 Last data filed at 04/16/2021 1338 Gross per 24 hour  Intake 565.98 ml  Output 2200 ml  Net -1634.02 ml    Filed Weights   04/13/21 0351 04/14/21 0345 04/16/21 0513  Weight: 62.5 kg 73.5 kg 64.4 kg    Examination: General exam: Conversant, in no acute distress Respiratory system: normal chest rise, clear, no audible wheezing Cardiovascular system: regular rhythm, s1-s2 Gastrointestinal system: Nondistended, nontender, pos BS Central nervous system: No seizures, no tremors Extremities: No cyanosis, no joint deformities Skin: No rashes, no pallor Psychiatry: Affect normal // no auditory hallucinations    Data Reviewed: I have personally reviewed following labs and imaging studies  CBC: Recent Labs  Lab 04/10/21 1043 04/11/21 0437 04/12/21 0454 04/13/21 0511  WBC 12.7* 7.6 10.7* 6.9  NEUTROABS 10.5*  --   --  3.9  HGB 11.5* 9.3* 9.7* 9.7*  HCT 34.2* 28.5* 30.6* 30.4*  MCV 88.8 89.9 93.0 91.3  PLT 144* 130* 194 716    Basic Metabolic Panel: Recent Labs  Lab 04/10/21 1640 04/11/21 0437 04/11/21 1415 04/12/21 0454 04/13/21 0511 04/14/21 0500 04/15/21 0335 04/16/21 0349  NA  --  131*   < > 133* 135 140 142 144  K  --  3.2*   < > 4.2 3.9 4.2 3.9 3.4*  CL  --  100   < > 109 112* 119* 115* 116*  CO2  --  18*   < > 13* 11* 12* 12* 17*  GLUCOSE  --  131*   < > 118* 109* 113* 125* 129*  BUN  --  54*   < > 66* 55* 47* 35* 26*  CREATININE  --  5.02*   < > 5.41* 4.63* 3.55* 2.58* 1.79*  CALCIUM  --  7.1*   < > 7.0* 7.4* 8.4* 9.1 9.0  MG 1.6* 2.1  --   --  1.9  --   --   --    < > = values in this interval not displayed.    GFR: Estimated Creatinine Clearance: 27 mL/min (A) (by C-G formula based on SCr of 1.79 mg/dL (H)). Liver Function Tests: Recent Labs  Lab 04/10/21 1043 04/11/21 0437 04/12/21 0454  04/13/21 0511  AST 101* 44* 32 35  ALT 43 26 22 22   ALKPHOS 83 62 74 73  BILITOT 1.3* 1.0 0.9 0.6  PROT 7.9 6.2* 6.2* 6.0*  ALBUMIN 3.3* 2.5* 2.3* 2.0*    No results for input(s): LIPASE, AMYLASE in the last 168 hours. Recent Labs  Lab 04/11/21 1415 04/14/21 1425  AMMONIA 18 22    Coagulation Profile: Recent Labs  Lab 04/10/21 1043 04/11/21 0437  INR 1.1 1.2  Cardiac Enzymes: No results for input(s): CKTOTAL, CKMB, CKMBINDEX, TROPONINI in the last 168 hours. BNP (last 3 results) No results for input(s): PROBNP in the last 8760 hours. HbA1C: No results for input(s): HGBA1C in the last 72 hours. CBG: Recent Labs  Lab 04/15/21 1142 04/15/21 1647 04/15/21 2111 04/16/21 0601 04/16/21 1158  GLUCAP 133* 116* 131* 118* 129*    Lipid Profile: No results for input(s): CHOL, HDL, LDLCALC, TRIG, CHOLHDL, LDLDIRECT in the last 72 hours. Thyroid Function Tests: No results for input(s): TSH, T4TOTAL, FREET4, T3FREE, THYROIDAB in the last 72 hours.  Anemia Panel: No results for input(s): VITAMINB12, FOLATE, FERRITIN, TIBC, IRON, RETICCTPCT in the last 72 hours.  Sepsis Labs: Recent Labs  Lab 04/10/21 1038 04/11/21 0437  PROCALCITON  --  7.23  LATICACIDVEN 1.8  --      Recent Results (from the past 240 hour(s))  Blood culture (routine single)     Status: None   Collection Time: 04/10/21 10:53 AM   Specimen: BLOOD  Result Value Ref Range Status   Specimen Description   Final    BLOOD RIGHT ANTECUBITAL Performed at [facility] [address] [place], [place] [address]    Special Requests   Final    BOTTLES DRAWN AEROBIC AND ANAEROBIC Blood Culture results may not be optimal due to an excessive volume of blood received in culture bottles Performed at [facility] [address], [place], [place] [address]    Culture   Final    NO GROWTH 5 DAYS Performed at [facility], [place] [address], [place], [place]  [address]    Report Status 04/15/2021 FINAL  Final  Resp Panel by RT-PCR (Flu A&B, Covid) Nasopharyngeal Swab     Status: None   Collection Time: 04/10/21 11:21 AM   Specimen: Nasopharyngeal Swab; Nasopharyngeal(NP) swabs in vial transport medium  Result Value Ref Range Status   SARS Coronavirus 2 by RT PCR NEGATIVE NEGATIVE Final    Comment: (NOTE) SARS-CoV-2 target nucleic acids are NOT DETECTED.  The SARS-CoV-2 RNA is generally detectable in upper respiratory specimens during the acute phase of infection. The lowest concentration of SARS-CoV-2 viral copies this assay can detect is 138 copies/mL. A negative result does not preclude SARS-Cov-2 infection and should not be used as the sole basis for treatment or other patient management decisions. A negative result may occur with  improper specimen collection/handling, submission of specimen other than nasopharyngeal swab, presence of viral mutation(s) within the areas targeted by this assay, and inadequate number of viral copies(<138 copies/mL). A negative result must be combined with clinical observations, patient history, and epidemiological information. The expected result is Negative.  Fact Sheet for Patients:  EntrepreneurPulse.com.au  Fact Sheet for Healthcare Providers:  IncredibleEmployment.be  This test is no t yet approved or cleared by the [place] FDA and  has been authorized for detection and/or diagnosis of SARS-CoV-2 by FDA under an Emergency Use Authorization (EUA). This EUA will remain  in effect (meaning this test can be used) for the duration of the COVID-19 declaration under Section 564(b)(1) of the Act, 21 U.S.C.section 360bbb-3(b)(1), unless the authorization is terminated  or revoked sooner.       Influenza A by PCR NEGATIVE NEGATIVE Final   Influenza B by PCR NEGATIVE NEGATIVE Final    Comment: (NOTE) The Xpert Xpress SARS-CoV-2/FLU/RSV plus assay is intended as an  aid in the diagnosis of influenza from Nasopharyngeal swab specimens and should not be  used as a sole basis for treatment. Nasal washings and aspirates are unacceptable for Xpert Xpress SARS-CoV-2/FLU/RSV testing.  Fact Sheet for Patients: EntrepreneurPulse.com.au  Fact Sheet for Healthcare Providers: IncredibleEmployment.be  This test is not yet approved or cleared by the [place] FDA and has been authorized for detection and/or diagnosis of SARS-CoV-2 by FDA under an Emergency Use Authorization (EUA). This EUA will remain in effect (meaning this test can be used) for the duration of the COVID-19 declaration under Section 564(b)(1) of the Act, 21 U.S.C. section 360bbb-3(b)(1), unless the authorization is terminated or revoked.  Performed at [facility], [place] [address], [place], [place] [address]   Urine culture     Status: Abnormal   Collection Time: 04/10/21  2:35 PM   Specimen: In/Out Cath Urine  Result Value Ref Range Status   Specimen Description   Final    IN/OUT CATH URINE Performed at [facility] [address] [place], [place] [address]    Special Requests   Final    NONE Performed at [facility], [place] [address] [place], [place] [address]    Culture >=100,000 COLONIES/mL KLEBSIELLA PNEUMONIAE (A)  Final   Report Status 04/13/2021 FINAL  Final   Organism ID, Bacteria KLEBSIELLA PNEUMONIAE (A)  Final      Susceptibility   Klebsiella pneumoniae - MIC*    AMPICILLIN RESISTANT Resistant     CEFAZOLIN <=4 SENSITIVE Sensitive     CEFEPIME <=0.12 SENSITIVE Sensitive     CEFTRIAXONE <=0.25 SENSITIVE Sensitive     CIPROFLOXACIN <=0.25 SENSITIVE Sensitive     GENTAMICIN <=1 SENSITIVE Sensitive     IMIPENEM <=0.25 SENSITIVE Sensitive     NITROFURANTOIN <=16 SENSITIVE Sensitive     TRIMETH/SULFA <=20 SENSITIVE Sensitive     AMPICILLIN/SULBACTAM <=2 SENSITIVE Sensitive      PIP/TAZO <=4 SENSITIVE Sensitive     * >=100,000 COLONIES/mL KLEBSIELLA PNEUMONIAE      Radiology Studies: No results found.  Scheduled Meds:  aspirin EC  81 mg Oral q morning   busPIRone  5 mg Oral QHS   cephALEXin  500 mg Oral Q12H   hydrALAZINE  25 mg Oral Q8H   insulin aspart  0-15 Units Subcutaneous TID WC   insulin aspart  0-5 Units Subcutaneous QHS   lactulose  20 g Oral BID   memantine  10 mg Oral QHS   potassium chloride  40 mEq Oral Daily   primidone  250 mg Oral BH-qamhs   sertraline  75 mg Oral QHS   sodium bicarbonate  650 mg Oral TID   Continuous Infusions:     LOS: 6 days   Marylu Lund, MD [facility] Pager On Amion  If 7PM-7AM, please contact night-coverage 04/16/2021, 3:01 PM
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
## 2021-04-16 NOTE — Plan of Care (Signed)

## 2021-04-16 NOTE — Progress Notes (Signed)
[facility] PROGRESS NOTE  Assessment/ Plan: Pt is a 65 y.o. yo female with history of HTN, HLD, DM, liver cirrhosis who was presented for weakness, dysuria, dizziness, altered mental status seen as a consultation for acute kidney injury.  #Acute kidney injury, nonoliguric likely ischemic ATN due to hypotension/volume depletion concomitant with  UTI.  Kidney ultrasound unremarkable.  The HD catheter was placed and then patient was transferred to [facility] for possible need for dialysis.  Fortunately, the patient has increasing urine output and creatinine level continue to improve to 1.79 from peak creatinine level of 5.41.  No need for dialysis therefore the HD catheter was discontinued yesterday.  I will discontinue IV fluid and start oral sodium bicarbonate.  Expect continued renal recovery to her baseline.  #Acute metabolic encephalopathy: Some uremic component in the beginning.  Not much improvement in mental status even after significant improvement of BUN level.  Further management per primary team.    #Urinary tract infection: On ceftriaxone.  #Hypokalemia: On oral potassium chloride.  Monitor lab.  #Metabolic acidosis but anion and non-anion gap.  Switching to oral sodium bicarbonate.  Sign off, please call back with question.  Subjective: Seen and examined at bedside.  She has around 2.4 L of urine output.  The HD catheter was removed.  She is alert awake but not much change in her mental status from yesterday.  No new event.  Objective Vital signs in last 24 hours: Vitals:   04/15/21 1600 04/15/21 1700 04/15/21 1917 04/16/21 0513  BP: (!) 167/79 (!) 176/88 (!) 177/89 (!) 133/95  Pulse: 80 99 96 91  Resp:  17 19 19   Temp:  98.4 F (36.9 C) 98.3 F (36.8 C) 97.6 F (36.4 C)  TempSrc:  Oral    SpO2: 100% 99% 99% 99%  Weight:    64.4 kg  Height:       Weight change:   Intake/Output Summary (Last 24 hours) at 04/16/2021 6720 Last data filed at  04/16/2021 9470 Gross per 24 hour  Intake 238.22 ml  Output 2185 ml  Net -1946.78 ml        Labs: Basic Metabolic Panel: Recent Labs  Lab 04/14/21 0500 04/15/21 0335 04/16/21 0349  NA 140 142 144  K 4.2 3.9 3.4*  CL 119* 115* 116*  CO2 12* 12* 17*  GLUCOSE 113* 125* 129*  BUN 47* 35* 26*  CREATININE 3.55* 2.58* 1.79*  CALCIUM 8.4* 9.1 9.0    Liver Function Tests: Recent Labs  Lab 04/11/21 0437 04/12/21 0454 04/13/21 0511  AST 44* 32 35  ALT 26 22 22   ALKPHOS 62 74 73  BILITOT 1.0 0.9 0.6  PROT 6.2* 6.2* 6.0*  ALBUMIN 2.5* 2.3* 2.0*    No results for input(s): LIPASE, AMYLASE in the last 168 hours. Recent Labs  Lab 04/11/21 1415 04/14/21 1425  AMMONIA 18 22    CBC: Recent Labs  Lab 04/10/21 1043 04/11/21 0437 04/12/21 0454 04/13/21 0511  WBC 12.7* 7.6 10.7* 6.9  NEUTROABS 10.5*  --   --  3.9  HGB 11.5* 9.3* 9.7* 9.7*  HCT 34.2* 28.5* 30.6* 30.4*  MCV 88.8 89.9 93.0 91.3  PLT 144* 130* 194 199    Cardiac Enzymes: No results for input(s): CKTOTAL, CKMB, CKMBINDEX, TROPONINI in the last 168 hours. CBG: Recent Labs  Lab 04/15/21 0620 04/15/21 1142 04/15/21 1647 04/15/21 2111 04/16/21 0601  GLUCAP 115* 133* 116* 131* 118*     Iron Studies: No results for  input(s): IRON, TIBC, TRANSFERRIN, FERRITIN in the last 72 hours. Studies/Results: No results found.  Medications: Infusions:    Scheduled Medications:  aspirin EC  81 mg Oral q morning   busPIRone  5 mg Oral QHS   cephALEXin  500 mg Oral Q12H   Chlorhexidine Gluconate Cloth  6 each Topical Daily   hydrALAZINE  25 mg Oral Q8H   insulin aspart  0-15 Units Subcutaneous TID WC   insulin aspart  0-5 Units Subcutaneous QHS   lactulose  20 g Oral BID   memantine  10 mg Oral QHS   potassium chloride  40 mEq Oral Daily   primidone  250 mg Oral BH-qamhs   sertraline  75 mg Oral QHS   sodium bicarbonate  650 mg Oral TID    have reviewed scheduled and prn medications.  Physical  Exam: General: Alert awake and slow to response.  Looks comfortable Heart:RRR, s1s2 nl Lungs: Clear b/l, no crackle Abdomen:soft, Non-tender, non-distended Extremities:No LE edema Dialysis Access: The HD catheter was removed, the site looks clean without any bleeding.  Brier Reid Reesa Chew Bowe Sidor 04/16/2021,9:22 AM  LOS: 6 days

## 2021-04-17 LAB — COMPREHENSIVE METABOLIC PANEL
ALT: 17 U/L (ref 0–44)
AST: 22 U/L (ref 15–41)
Albumin: 2.5 g/dL — ABNORMAL LOW (ref 3.5–5.0)
Alkaline Phosphatase: 78 U/L (ref 38–126)
Anion gap: 10 (ref 5–15)
BUN: 21 mg/dL (ref 8–23)
CO2: 20 mmol/L — ABNORMAL LOW (ref 22–32)
Calcium: 8.8 mg/dL — ABNORMAL LOW (ref 8.9–10.3)
Chloride: 109 mmol/L (ref 98–111)
Creatinine, Ser: 1.31 mg/dL — ABNORMAL HIGH (ref 0.44–1.00)
GFR, Estimated: 46 mL/min — ABNORMAL LOW (ref >60)
Glucose, Bld: 131 mg/dL — ABNORMAL HIGH (ref 70–99)
Potassium: 2.9 mmol/L — ABNORMAL LOW (ref 3.5–5.1)
Sodium: 139 mmol/L (ref 135–145)
Total Bilirubin: 0.9 mg/dL (ref 0.3–1.2)
Total Protein: 7 g/dL (ref 6.5–8.1)

## 2021-04-17 LAB — GLUCOSE, CAPILLARY
Glucose-Capillary: 124 mg/dL — ABNORMAL HIGH (ref 70–99)
Glucose-Capillary: 141 mg/dL — ABNORMAL HIGH (ref 70–99)
Glucose-Capillary: 135 mg/dL — ABNORMAL HIGH (ref 70–99)
Glucose-Capillary: 122 mg/dL — ABNORMAL HIGH (ref 70–99)

## 2021-04-17 MED ORDER — HYDRALAZINE HCL 50 MG PO TABS
50.0000 mg | ORAL_TABLET | Freq: Three times a day (TID) | ORAL | Status: DC
  Administered 2021-04-17 – 2021-04-19 (×5): 50 mg via ORAL
  Filled 2021-04-17 (×6): qty 1

## 2021-04-17 MED ORDER — NYSTATIN 100000 UNIT/GM EX POWD
Freq: Three times a day (TID) | CUTANEOUS | Status: DC
  Filled 2021-04-17: qty 15

## 2021-04-17 MED ORDER — POTASSIUM CHLORIDE CRYS ER 20 MEQ PO TBCR
60.0000 meq | EXTENDED_RELEASE_TABLET | Freq: Two times a day (BID) | ORAL | Status: AC
  Administered 2021-04-17 (×2): 60 meq via ORAL
  Filled 2021-04-17 (×2): qty 3

## 2021-04-17 MED ORDER — POTASSIUM CHLORIDE CRYS ER 20 MEQ PO TBCR
40.0000 meq | EXTENDED_RELEASE_TABLET | Freq: Every day | ORAL | Status: DC
  Administered 2021-04-18 – 2021-04-19 (×2): 40 meq via ORAL
  Filled 2021-04-17 (×2): qty 2

## 2021-04-17 MED ORDER — AMLODIPINE BESYLATE 5 MG PO TABS
5.0000 mg | ORAL_TABLET | Freq: Every day | ORAL | Status: DC
  Administered 2021-04-17: 5 mg via ORAL
  Filled 2021-04-17: qty 1

## 2021-04-17 MED ORDER — AMLODIPINE BESYLATE 10 MG PO TABS
10.0000 mg | ORAL_TABLET | Freq: Every day | ORAL | Status: DC
  Administered 2021-04-18 – 2021-04-19 (×2): 10 mg via ORAL
  Filled 2021-04-17 (×2): qty 1

## 2021-04-17 NOTE — Plan of Care (Signed)

## 2021-04-17 NOTE — Progress Notes (Signed)
PROGRESS NOTE    Carrie [name]  BLT:[id number] DOB: [birth year] DOA: 04/10/2021 PCP: Pcp, No    Brief Narrative:  65 year old female with history of hypertension, hyperlipidemia, diabetes mellitus type 2, cirrhosis of liver presented with dizziness, weakness, dysuria and increased frequency.  On presentation, she was found to have acute kidney injury along with acute pyelonephritis based on CT findings along with leukocytosis.  She was started on IV fluids and antibiotics.  Assessment & Plan:   Active Problems:   Pyelonephritis   Pressure injury of skin  Acute kidney injury Acute metabolic acidosis Acute toxic metabolic encephalopathy -Presented with creatinine of 4.76; creatinine has continued to get worse and is 5.41 today.  Urine output is not that good.  Renal ultrasound was negative for hydronephrosis. -Initially transferred to [facility] for potential dialysis. 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  Recommend outpatient follow-up with GI -Very good results with lactulose, now with 7 BM overnight -Will hold lactulose for today, titrate for BM 2-3x per day   Hypokalemia -Low today, will correct -repeat lytes in AM  Hyponatremia -Noted to be normalized   Thrombocytopenia -noted to be normalized   Leukocytosis -Resolved   Possible anemia of chronic disease -From chronic illnesses.  Hemoglobin stable.  No signs of bleeding.   -Cont to monitor  Essential hypertension -Olmesartan on hold -Pt had been on midodrine for hypotension initially -BP now stable. Stopped midodrine -Have started scheduled hydralazine -BP suboptimally controlled. Increase norvasc to 8m and increase hydralazine to 579mtid  Reported memory deficit -Pt is continued on namenda    Psoriatic arthritis/chronic pain -Outpatient follow-up    Essential tremor -Continue primidone   Anxiety/depression -Continue buspirone, sertraline -Currently stable   Diabetes mellitus type II -Continue sliding scale insulin as needed   History of mesenteric artery stenosis -Continue aspirin.  Statin held on presentation due to elevated LFTs.  Can possibly resume on discharge.    DVT prophylaxis: SCD's Code Status: Full Family Communication: Pt in room, family is at bedside  Status is: Inpatient  Remains inpatient appropriate because:Inpatient level of care appropriate due to severity of illness  Dispo: The patient is from: Home              Anticipated d/c is to: SNF              Patient currently is not medically stable to d/c.   Difficult to place patient No   Consultants:  nephrology  Procedures:    Antimicrobials: Anti-infectives (From admission, onward)    Start     Dose/Rate Route Frequency Ordered Stop   04/15/21 2200  cephALEXin (KEFLEX) capsule 500 mg        500 mg Oral Every 12 hours 04/15/21 1244 04/16/21 2234   04/12/21 2200  cefTRIAXone (ROCEPHIN) 1 g in sodium chloride 0.9 % 100 mL IVPB  Status:  Discontinued        1 g 200 mL/hr over 30 Minutes Intravenous Every 24 hours 04/12/21 0906 04/15/21 1244   04/10/21 2200  ceFEPIme (MAXIPIME) 1 g in sodium chloride 0.9 % 100 mL IVPB  Status:  Discontinued        1 g 200 mL/hr over 30 Minutes Intravenous Every 24 hours 04/10/21 1700 04/12/21 0906   04/10/21 1200  vancomycin (  VANCOCIN) IVPB 1000 mg/200 mL premix        1,000 mg 200 mL/hr over 60 Minutes Intravenous  Once 04/10/21 1148 04/10/21 1306   04/10/21 1145  piperacillin-tazobactam (ZOSYN) IVPB 3.375 g        3.375 g 100 mL/hr over 30 Minutes Intravenous STAT 04/10/21 1138 04/10/21 1211       Subjective: Reports feeling better  Objective: Vitals:   04/17/21 0058 04/17/21 0317 04/17/21 0643 04/17/21 1141  BP:  (!) 162/80 (!) 169/83 (!)  168/87  Pulse:  98 85 88  Resp:  20 16 20   Temp:  98.3 F (36.8 C) 97.7 F (36.5 C) 98 F (36.7 C)  TempSrc:  Oral Oral Oral  SpO2:  97% 97% 98%  Weight: 63.4 kg     Height:        Intake/Output Summary (Last 24 hours) at 04/17/2021 1411 Last data filed at 04/17/2021 1147 Gross per 24 hour  Intake 840 ml  Output 625 ml  Net 215 ml    Filed Weights   04/14/21 0345 04/16/21 0513 04/17/21 0058  Weight: 73.5 kg 64.4 kg 63.4 kg    Examination: General exam: Awake, laying in bed, in nad Respiratory system: Normal respiratory effort, no wheezing Cardiovascular system: regular rate, s1, s2 Gastrointestinal system: Soft, nondistended, positive BS Central nervous system: CN2-12 grossly intact, strength intact Extremities: Perfused, no clubbing Skin: Normal skin turgor, no notable skin lesions seen Psychiatry: Mood normal // no visual hallucinations   Data Reviewed: I have personally reviewed following labs and imaging studies  CBC: Recent Labs  Lab 04/11/21 0437 04/12/21 0454 04/13/21 0511  WBC 7.6 10.7* 6.9  NEUTROABS  --   --  3.9  HGB 9.3* 9.7* 9.7*  HCT 28.5* 30.6* 30.4*  MCV 89.9 93.0 91.3  PLT 130* 194 979    Basic Metabolic Panel: Recent Labs  Lab 04/10/21 1640 04/11/21 0437 04/11/21 1415 04/13/21 0511 04/14/21 0500 04/15/21 0335 04/16/21 0349 04/17/21 0308  NA  --  131*   < > 135 140 142 144 139  K  --  3.2*   < > 3.9 4.2 3.9 3.4* 2.9*  CL  --  100   < > 112* 119* 115* 116* 109  CO2  --  18*   < > 11* 12* 12* 17* 20*  GLUCOSE  --  131*   < > 109* 113* 125* 129* 131*  BUN  --  54*   < > 55* 47* 35* 26* 21  CREATININE  --  5.02*   < > 4.63* 3.55* 2.58* 1.79* 1.31*  CALCIUM  --  7.1*   < > 7.4* 8.4* 9.1 9.0 8.8*  MG 1.6* 2.1  --  1.9  --   --   --   --    < > = values in this interval not displayed.    GFR: Estimated Creatinine Clearance: 36.6 mL/min (A) (by C-G formula based on SCr of 1.31 mg/dL (H)). Liver Function Tests: Recent Labs  Lab  04/11/21 0437 04/12/21 0454 04/13/21 0511 04/17/21 0308  AST 44* 32 35 22  ALT 26 22 22 17   ALKPHOS 62 74 73 78  BILITOT 1.0 0.9 0.6 0.9  PROT 6.2* 6.2* 6.0* 7.0  ALBUMIN 2.5* 2.3* 2.0* 2.5*    No results for input(s): LIPASE, AMYLASE in the last 168 hours. Recent Labs  Lab 04/11/21 1415 04/14/21 1425  AMMONIA 18 22    Coagulation Profile: Recent Labs  Lab 04/11/21 0437  INR 1.2    Cardiac Enzymes: No results for input(s): CKTOTAL, CKMB, CKMBINDEX, TROPONINI in the last 168 hours. BNP (last 3 results) No results for input(s): PROBNP in the last 8760 hours. HbA1C: No results for input(s): HGBA1C in the last 72 hours. CBG: Recent Labs  Lab 04/16/21 1158 04/16/21 1618 04/16/21 2116 04/17/21 0558 04/17/21 1118  GLUCAP 129* 92 117* 124* 141*    Lipid Profile: No results for input(s): CHOL, HDL, LDLCALC, TRIG, CHOLHDL, LDLDIRECT in the last 72 hours. Thyroid Function Tests: No results for input(s): TSH, T4TOTAL, FREET4, T3FREE, THYROIDAB in the last 72 hours.  Anemia Panel: No results for input(s): VITAMINB12, FOLATE, FERRITIN, TIBC, IRON, RETICCTPCT in the last 72 hours.  Sepsis Labs: Recent Labs  Lab 04/11/21 0437  PROCALCITON 7.23     Recent Results (from the past 240 hour(s))  Blood culture (routine single)     Status: None   Collection Time: 04/10/21 10:53 AM   Specimen: BLOOD  Result Value Ref Range Status   Specimen Description   Final    BLOOD RIGHT ANTECUBITAL Performed at [facility] [address], [place], [place] [address]    Special Requests   Final    BOTTLES DRAWN AEROBIC AND ANAEROBIC Blood Culture results may not be optimal due to an excessive volume of blood received in culture bottles Performed at [facility] [address], [place], [place] [address]    Culture   Final    NO GROWTH 5 DAYS Performed at [facility], [place] [address], [place], [place] [address]    Report Status  04/15/2021 FINAL  Final  Resp Panel by RT-PCR (Flu A&B, Covid) Nasopharyngeal Swab     Status: None   Collection Time: 04/10/21 11:21 AM   Specimen: Nasopharyngeal Swab; Nasopharyngeal(NP) swabs in vial transport medium  Result Value Ref Range Status   SARS Coronavirus 2 by RT PCR NEGATIVE NEGATIVE Final    Comment: (NOTE) SARS-CoV-2 target nucleic acids are NOT DETECTED.  The SARS-CoV-2 RNA is generally detectable in upper respiratory specimens during the acute phase of infection. The lowest concentration of SARS-CoV-2 viral copies this assay can detect is 138 copies/mL. A negative result does not preclude SARS-Cov-2 infection and should not be used as the sole basis for treatment or other patient management decisions. A negative result may occur with  improper specimen collection/handling, submission of specimen other than nasopharyngeal swab, presence of viral mutation(s) within the areas targeted by this assay, and inadequate number of viral copies(<138 copies/mL). A negative result must be combined with clinical observations, patient history, and epidemiological information. The expected result is Negative.  Fact Sheet for Patients:  EntrepreneurPulse.com.au  Fact Sheet for Healthcare Providers:  IncredibleEmployment.be  This test is no t yet approved or cleared by the [place] FDA and  has been authorized for detection and/or diagnosis of SARS-CoV-2 by FDA under an Emergency Use Authorization (EUA). This EUA will remain  in effect (meaning this test can be used) for the duration of the COVID-19 declaration under Section 564(b)(1) of the Act, 21 U.S.C.section 360bbb-3(b)(1), unless the authorization is terminated  or revoked sooner.       Influenza A by PCR NEGATIVE NEGATIVE Final   Influenza B by PCR NEGATIVE NEGATIVE Final    Comment: (NOTE) The Xpert Xpress SARS-CoV-2/FLU/RSV plus assay is intended as an aid in the diagnosis of  influenza from Nasopharyngeal swab specimens and should not be used as a sole basis for  treatment. Nasal washings and aspirates are unacceptable for Xpert Xpress SARS-CoV-2/FLU/RSV testing.  Fact Sheet for Patients: EntrepreneurPulse.com.au  Fact Sheet for Healthcare Providers: IncredibleEmployment.be  This test is not yet approved or cleared by the [place] FDA and has been authorized for detection and/or diagnosis of SARS-CoV-2 by FDA under an Emergency Use Authorization (EUA). This EUA will remain in effect (meaning this test can be used) for the duration of the COVID-19 declaration under Section 564(b)(1) of the Act, 21 U.S.C. section 360bbb-3(b)(1), unless the authorization is terminated or revoked.  Performed at [facility], [place] [address], [place], [place] [address]   Urine culture     Status: Abnormal   Collection Time: 04/10/21  2:35 PM   Specimen: In/Out Cath Urine  Result Value Ref Range Status   Specimen Description   Final    IN/OUT CATH URINE Performed at [facility] [address], [place], [place] [address]    Special Requests   Final    NONE Performed at [facility], [place] [address], [place], [place] [address]    Culture >=100,000 COLONIES/mL KLEBSIELLA PNEUMONIAE (A)  Final   Report Status 04/13/2021 FINAL  Final   Organism ID, Bacteria KLEBSIELLA PNEUMONIAE (A)  Final      Susceptibility   Klebsiella pneumoniae - MIC*    AMPICILLIN RESISTANT Resistant     CEFAZOLIN <=4 SENSITIVE Sensitive     CEFEPIME <=0.12 SENSITIVE Sensitive     CEFTRIAXONE <=0.25 SENSITIVE Sensitive     CIPROFLOXACIN <=0.25 SENSITIVE Sensitive     GENTAMICIN <=1 SENSITIVE Sensitive     IMIPENEM <=0.25 SENSITIVE Sensitive     NITROFURANTOIN <=16 SENSITIVE Sensitive     TRIMETH/SULFA <=20 SENSITIVE Sensitive     AMPICILLIN/SULBACTAM <=2 SENSITIVE Sensitive     PIP/TAZO <=4 SENSITIVE  Sensitive     * >=100,000 COLONIES/mL KLEBSIELLA PNEUMONIAE      Radiology Studies: No results found.  Scheduled Meds:  [START ON 04/18/2021] amLODipine  10 mg Oral Daily   aspirin EC  81 mg Oral q morning   busPIRone  5 mg Oral QHS   hydrALAZINE  25 mg Oral Q8H   insulin aspart  0-15 Units Subcutaneous TID WC   insulin aspart  0-5 Units Subcutaneous QHS   memantine  10 mg Oral QHS   [START ON 04/18/2021] potassium chloride  40 mEq Oral Daily   potassium chloride  60 mEq Oral BID   primidone  250 mg Oral BH-qamhs   sertraline  75 mg Oral QHS   sodium bicarbonate  650 mg Oral TID   Continuous Infusions:     LOS: 7 days   Marylu Lund, MD [facility] Pager On Amion  If 7PM-7AM, please contact night-coverage 04/17/2021, 2:11 PM

## 2021-04-17 NOTE — Progress Notes (Signed)
   04/17/21 0042  Assess: MEWS Score  Temp (!) 97.5 F (36.4 C)  BP (!) 169/79  Pulse Rate (!) 101  Resp (!) 22  SpO2 97 %  O2 Device Room Air  Assess: MEWS Score  MEWS Temp 0  MEWS Systolic 0  MEWS Pulse 1  MEWS RR 1  MEWS LOC 0  MEWS Score 2  MEWS Score Color Yellow  Assess: if the MEWS score is Yellow or Red  Were vital signs taken at a resting state? Yes  Focused Assessment No change from prior assessment  Does the patient meet 2 or more of the SIRS criteria? No  Does the patient have a confirmed or suspected source of infection? No  Provider and Rapid Response Notified? Yes (provider notified)  Early Detection of Sepsis Score *See Row Information* Low  MEWS guidelines implemented *See Row Information* Yes  Treat  MEWS Interventions Administered prn meds/treatments;Administered scheduled meds/treatments  Pain Scale 0-10  Pain Score 6  Pain Type Acute pain  Pain Location Chest  Pain Orientation Left  Pain Descriptors / Indicators Pressure  Pain Frequency Intermittent  Pain Onset Sudden  Pain Intervention(s) MD notified (Comment)  Take Vital Signs  Increase Vital Sign Frequency  Yellow: Q 2hr X 2 then Q 4hr X 2, if remains yellow, continue Q 4hrs  Escalate  MEWS: Escalate Yellow: discuss with charge nurse/RN and consider discussing with provider and RRT  Notify: Charge Nurse/RN  Name of Charge Nurse/RN Notified Lyn, RN  Date Charge Nurse/RN Notified 04/17/21  Time Charge Nurse/RN Notified 0045  Notify: Provider  Provider Name/Title [place] Crane  Date Provider Notified 04/17/21  Time Provider Notified 0045  Notification Type Page  Notification Reason Change in status  Provider response See new orders  Date of Provider Response 04/17/21  Time of Provider Response 0050  Document  Patient Outcome Stabilized after interventions  Progress note created (see row info) Yes  Assess: SIRS CRITERIA  SIRS Temperature  0  SIRS Pulse 1  SIRS Respirations  1  SIRS WBC 0  SIRS  Score Sum  2   Patient with new onset of chest pain.  The pain is described as 6/10, pressure-like, on the left side and non radiating.  The pain occurred suddenly when getting up for the [facility] and did not immediately improve with rest.  EKG completed and provider notified.    Due to patient continued elevated BP norvasc added by provider and given. During discussions with provider, patient's pain improved.

## 2021-04-17 NOTE — Social Work (Addendum)
To Whom It May Concern:  Please be advised that the above-named patient will require a short-term nursing home stay - anticipated 30 days or less for rehabilitation and strengthening.  The plan is for return home.
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
## 2021-04-17 NOTE — NC FL2 (Addendum)
[place] LEVEL OF CARE SCREENING TOOL     IDENTIFICATION  Patient Name: Carrie [name] Birthdate: 03-Nov-1955 Sex: female Admission Date (Current Location): 04/10/2021  [facility] and [place] Number:  Herbalist and Address:  [facility] 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   Psoriasis 07/08/2017   Diabetes ([place]) 02/16/2016   Chest pain 12/04/2013   Diabetes mellitus type II, uncontrolled ([place]) 12/04/2013   HTN (hypertension) 12/04/2013   Hypoglycemia 12/04/2013   Pyelonephritis 10/10/2013   UTI (lower urinary tract infection) 10/10/2013   HBP (high blood pressure) 04/21/2013   Gastroparesis 02/25/2013   Cough 02/25/2013   Nausea and vomiting 01/06/2013   Nausea alone 05/15/2011   STRICTURE AND STENOSIS OF ESOPHAGUS 10/21/2009   Hyperlipidemia 01/09/2008   OBESITY 01/09/2008   Asthma 01/09/2008   GERD 01/09/2008   HERNIA, VENTRAL 01/09/2008   ARTHRITIS 01/09/2008   Depression 03/25/2007   ANXIETY 03/25/2007    Orientation RESPIRATION BLADDER Height & Weight     Self, Time, Place  Normal Continent Weight: 139 lb 12.4 oz (63.4 kg) Height:  5' (152.4 cm)  BEHAVIORAL SYMPTOMS/MOOD NEUROLOGICAL BOWEL NUTRITION STATUS      Continent Diet (See DC summary)  AMBULATORY  STATUS COMMUNICATION OF NEEDS Skin   Limited Assist Verbally PU Stage and Appropriate Care (PI St 2 Lower Medial Sacrum)                       Personal Care Assistance Level of Assistance  Bathing, Feeding, Dressing Bathing Assistance: Limited assistance Feeding assistance: Independent Dressing Assistance: Limited assistance     Functional Limitations Info  Sight, Hearing, Speech Sight Info: Adequate Hearing Info: Adequate Speech Info: Adequate    SPECIAL CARE FACTORS FREQUENCY  PT (By licensed PT), OT (By licensed OT)     PT Frequency: 5x week OT Frequency: 5x week            Contractures Contractures Info: Not present    Additional Factors Info  Code Status, Allergies, Psychotropic, Insulin Sliding Scale Code Status Info: Full Allergies Info: Lisinopril Psychotropic Info: Sertraline (zoloft), Busiprone (Buspar) Insulin Sliding Scale Info: Insulin Aspart (Novolog) 0-15 U 3x week with meals, 0-5 U @ bedtime       Current Medications (04/17/2021):  This is the current hospital active medication list Current Facility-Administered Medications  Medication Dose Route Frequency Provider Last Rate Last Admin   acetaminophen (TYLENOL) tablet 650 mg  650 mg Oral Q6H PRN Marylyn Ishihara, Tyrone A, DO   650 mg at 04/10/21 1624   Or   acetaminophen (TYLENOL) suppository 650 mg  650 mg Rectal Q6H PRN Marylyn Ishihara, Tyrone A, DO       albuterol (PROVENTIL) (2.5 MG/3ML) 0.083% nebulizer solution 2.5 mg  2.5 mg Inhalation Q4H PRN Cherylann Ratel  A, DO       [START ON 04/18/2021] amLODipine (NORVASC) tablet 10 mg  10 mg Oral Daily Donne Hazel, MD       aspirin EC tablet 81 mg  81 mg Oral q morning Marylyn Ishihara, Tyrone A, DO   81 mg at 04/17/21 0955   busPIRone (BUSPAR) tablet 5 mg  5 mg Oral QHS Kyle, Tyrone A, DO   5 mg at 04/16/21 2233   chlorproMAZINE (THORAZINE) tablet 10 mg  10 mg Oral TID PRN Caren Griffins, MD       hydrALAZINE (APRESOLINE) injection 10 mg  10 mg Intravenous Q4H PRN Donne Hazel,  MD   10 mg at 04/17/21 0002   hydrALAZINE (APRESOLINE) tablet 25 mg  25 mg Oral Q8H Donne Hazel, MD   25 mg at 04/17/21 3220   HYDROcodone-acetaminophen (NORCO/VICODIN) 5-325 MG per tablet 1-2 tablet  1-2 tablet Oral Q4H PRN Cherylann Ratel A, DO   2 tablet at 04/10/21 2239   insulin aspart (novoLOG) injection 0-15 Units  0-15 Units Subcutaneous TID WC Aline August, MD   2 Units at 04/17/21 0612   insulin aspart (novoLOG) injection 0-5 Units  0-5 Units Subcutaneous QHS Marylyn Ishihara, Tyrone A, DO       memantine (NAMENDA) tablet 10 mg  10 mg Oral QHS Kyle, Tyrone A, DO   10 mg at 04/16/21 2233   ondansetron (ZOFRAN) tablet 4 mg  4 mg Oral Q6H PRN Cherylann Ratel A, DO       Or   ondansetron (ZOFRAN) injection 4 mg  4 mg Intravenous Q6H PRN Marylyn Ishihara, Tyrone A, DO   4 mg at 04/11/21 1320   [START ON 04/18/2021] potassium chloride SA (KLOR-CON) CR tablet 40 mEq  40 mEq Oral Daily Donne Hazel, MD       potassium chloride SA (KLOR-CON) CR tablet 60 mEq  60 mEq Oral BID Donne Hazel, MD   60 mEq at 04/17/21 2542   primidone (MYSOLINE) tablet 250 mg  250 mg Oral BH-qamhs Kyle, Tyrone A, DO   250 mg at 04/17/21 0955   sertraline (ZOLOFT) tablet 75 mg  75 mg Oral QHS Kyle, Tyrone A, DO   75 mg at 04/16/21 2234   sodium bicarbonate tablet 650 mg  650 mg Oral TID Rosita Fire, MD   650 mg at 04/17/21 7062     Discharge Medications: Please see discharge summary for a list of discharge medications.  Relevant Imaging Results:  Relevant Lab Results:   Additional Information SS# [id number] [place]

## 2021-04-18 LAB — URINALYSIS, ROUTINE W REFLEX MICROSCOPIC
Bacteria, UA: NONE SEEN
Bilirubin Urine: NEGATIVE
Glucose, UA: NEGATIVE mg/dL
Ketones, ur: NEGATIVE mg/dL
Leukocytes,Ua: NEGATIVE
Nitrite: NEGATIVE
Protein, ur: NEGATIVE mg/dL
Specific Gravity, Urine: 1.01 (ref 1.005–1.030)
pH: 5 (ref 5.0–8.0)

## 2021-04-18 LAB — GLUCOSE, CAPILLARY
Glucose-Capillary: 136 mg/dL — ABNORMAL HIGH (ref 70–99)
Glucose-Capillary: 146 mg/dL — ABNORMAL HIGH (ref 70–99)
Glucose-Capillary: 184 mg/dL — ABNORMAL HIGH (ref 70–99)
Glucose-Capillary: 186 mg/dL — ABNORMAL HIGH (ref 70–99)
Glucose-Capillary: 136 mg/dL — ABNORMAL HIGH (ref 70–99)

## 2021-04-18 LAB — COMPREHENSIVE METABOLIC PANEL
ALT: 15 U/L (ref 0–44)
AST: 21 U/L (ref 15–41)
Albumin: 2.5 g/dL — ABNORMAL LOW (ref 3.5–5.0)
Alkaline Phosphatase: 69 U/L (ref 38–126)
Anion gap: 3 — ABNORMAL LOW (ref 5–15)
BUN: 20 mg/dL (ref 8–23)
CO2: 25 mmol/L (ref 22–32)
Calcium: 8.5 mg/dL — ABNORMAL LOW (ref 8.9–10.3)
Chloride: 111 mmol/L (ref 98–111)
Creatinine, Ser: 1.33 mg/dL — ABNORMAL HIGH (ref 0.44–1.00)
GFR, Estimated: 45 mL/min — ABNORMAL LOW (ref >60)
Glucose, Bld: 153 mg/dL — ABNORMAL HIGH (ref 70–99)
Potassium: 4 mmol/L (ref 3.5–5.1)
Sodium: 139 mmol/L (ref 135–145)
Total Bilirubin: 0.5 mg/dL (ref 0.3–1.2)
Total Protein: 7.1 g/dL (ref 6.5–8.1)

## 2021-04-18 LAB — MAGNESIUM: Magnesium: 1.2 mg/dL — ABNORMAL LOW (ref 1.7–2.4)

## 2021-04-18 MED ORDER — MAGNESIUM SULFATE 4 GM/100ML IV SOLN
4.0000 g | Freq: Once | INTRAVENOUS | Status: AC
  Administered 2021-04-18: 4 g via INTRAVENOUS
  Filled 2021-04-18: qty 100

## 2021-04-18 MED ORDER — LACTULOSE 10 GM/15ML PO SOLN
10.0000 g | Freq: Every day | ORAL | Status: DC
  Administered 2021-04-18 – 2021-04-19 (×2): 10 g via ORAL
  Filled 2021-04-18 (×2): qty 15

## 2021-04-18 NOTE — Progress Notes (Signed)
PROGRESS NOTE    Carrie [name]  MVV:[id number] DOB: November 02, 1955 DOA: 04/10/2021 PCP: Pcp, No    Brief Narrative:  65 year old female with history of hypertension, hyperlipidemia, diabetes mellitus type 2, cirrhosis of liver presented with dizziness, weakness, dysuria and increased frequency.  On presentation, she was found to have acute kidney injury along with acute pyelonephritis based on CT findings along with leukocytosis.  She was started on IV fluids and antibiotics.  Assessment & Plan:   Active Problems:   Pyelonephritis   Pressure injury of skin  Acute kidney injury Acute metabolic acidosis Acute toxic metabolic encephalopathy -Presented with creatinine of 4.76; creatinine has continued to get worse and is 5.41 today.  Urine output is not that good.  Renal ultrasound was negative for hydronephrosis. -Initially transferred to [facility] for potential dialysis. 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  Recommend outpatient follow-up with GI -responding to lactulose -Will continue lactulose today with goal of 2-3 BM daily   Hypokalemia -Corrected -Repeat bmet in AM  Hyponatremia -Noted to be normalized   Thrombocytopenia -noted to be normalized   Leukocytosis -Resolved   Possible anemia of chronic disease -From chronic illnesses.  Hemoglobin stable.  No signs of bleeding.   -Cont to monitor  Essential hypertension -Olmesartan on hold -Pt had been on midodrine for hypotension initially -BP now stable. Stopped midodrine -Have started scheduled hydralazine -BP suboptimally controlled. Continue norvasc at 74m but increase hydralazine to 779mtid  Reported memory deficit -Pt is continued on namenda   Psoriatic arthritis/chronic pain -Outpatient  follow-up    Essential tremor -Continue primidone   Anxiety/depression -Continue buspirone, sertraline -Currently stable   Diabetes mellitus type II -Continue sliding scale insulin as needed   History of mesenteric artery stenosis -Continue aspirin.  Statin held on presentation due to elevated LFTs.  Likely resume at time of d/c    DVT prophylaxis: SCD's Code Status: Full Family Communication: Pt in room, family is currently not at bedside  Status is: Inpatient  Remains inpatient appropriate because:Inpatient level of care appropriate due to severity of illness  Dispo: The patient is from: Home              Anticipated d/c is to: SNF              Patient currently is not medically stable to d/c.   Difficult to place patient No   Consultants:  nephrology  Procedures:    Antimicrobials: Anti-infectives (From admission, onward)    Start     Dose/Rate Route Frequency Ordered Stop   04/15/21 2200  cephALEXin (KEFLEX) capsule 500 mg        500 mg Oral Every 12 hours 04/15/21 1244 04/16/21 2234   04/12/21 2200  cefTRIAXone (ROCEPHIN) 1 g in sodium chloride 0.9 % 100 mL IVPB  Status:  Discontinued        1 g 200 mL/hr over 30 Minutes Intravenous Every 24 hours 04/12/21 0906 04/15/21 1244   04/10/21 2200  ceFEPIme (MAXIPIME) 1 g in sodium chloride 0.9 % 100 mL IVPB  Status:  Discontinued        1 g 200 mL/hr over 30 Minutes Intravenous Every 24 hours 04/10/21 1700 04/12/21 0906   04/10/21 1200  vancomycin (VANCOCIN) IVPB 1000 mg/200 mL premix  1,000 mg 200 mL/hr over 60 Minutes Intravenous  Once 04/10/21 1148 04/10/21 1306   04/10/21 1145  piperacillin-tazobactam (ZOSYN) IVPB 3.375 g        3.375 g 100 mL/hr over 30 Minutes Intravenous STAT 04/10/21 1138 04/10/21 1211       Subjective: Without complaints this AM  Objective: Vitals:   04/17/21 1939 04/18/21 0546 04/18/21 0553 04/18/21 1114  BP: (!) 156/82 (!) 172/83  (!) 153/81  Pulse: 95 90  93  Resp: 20  18  16   Temp: 98.6 F (37 C) 98.2 F (36.8 C)  98.2 F (36.8 C)  TempSrc: Oral Oral  Oral  SpO2: 98% 97%  94%  Weight:   63.9 kg   Height:        Intake/Output Summary (Last 24 hours) at 04/18/2021 1401 Last data filed at 04/18/2021 0945 Gross per 24 hour  Intake 840 ml  Output 525 ml  Net 315 ml    Filed Weights   04/16/21 0513 04/17/21 0058 04/18/21 0553  Weight: 64.4 kg 63.4 kg 63.9 kg    Examination: General exam: Conversant, in no acute distress Respiratory system: normal chest rise, clear, no audible wheezing Cardiovascular system: regular rhythm, s1-s2 Gastrointestinal system: Nondistended, nontender, pos BS Central nervous system: No seizures, no tremors Extremities: No cyanosis, no joint deformities Skin: No rashes, no pallor Psychiatry: Affect normal // no auditory hallucinations   Data Reviewed: I have personally reviewed following labs and imaging studies  CBC: Recent Labs  Lab 04/12/21 0454 04/13/21 0511  WBC 10.7* 6.9  NEUTROABS  --  3.9  HGB 9.7* 9.7*  HCT 30.6* 30.4*  MCV 93.0 91.3  PLT 194 850    Basic Metabolic Panel: Recent Labs  Lab 04/13/21 0511 04/14/21 0500 04/15/21 0335 04/16/21 0349 04/17/21 0308 04/18/21 0254  NA 135 140 142 144 139 139  K 3.9 4.2 3.9 3.4* 2.9* 4.0  CL 112* 119* 115* 116* 109 111  CO2 11* 12* 12* 17* 20* 25  GLUCOSE 109* 113* 125* 129* 131* 153*  BUN 55* 47* 35* 26* 21 20  CREATININE 4.63* 3.55* 2.58* 1.79* 1.31* 1.33*  CALCIUM 7.4* 8.4* 9.1 9.0 8.8* 8.5*  MG 1.9  --   --   --   --  1.2*    GFR: Estimated Creatinine Clearance: 36.2 mL/min (A) (by C-G formula based on SCr of 1.33 mg/dL (H)). Liver Function Tests: Recent Labs  Lab 04/12/21 0454 04/13/21 0511 04/17/21 0308 04/18/21 0254  AST 32 35 22 21  ALT 22 22 17 15   ALKPHOS 74 73 78 69  BILITOT 0.9 0.6 0.9 0.5  PROT 6.2* 6.0* 7.0 7.1  ALBUMIN 2.3* 2.0* 2.5* 2.5*    No results for input(s): LIPASE, AMYLASE in the last 168 hours. Recent Labs   Lab 04/11/21 1415 04/14/21 1425  AMMONIA 18 22    Coagulation Profile: No results for input(s): INR, PROTIME in the last 168 hours.  Cardiac Enzymes: No results for input(s): CKTOTAL, CKMB, CKMBINDEX, TROPONINI in the last 168 hours. BNP (last 3 results) No results for input(s): PROBNP in the last 8760 hours. HbA1C: No results for input(s): HGBA1C in the last 72 hours. CBG: Recent Labs  Lab 04/17/21 1118 04/17/21 1625 04/17/21 2118 04/18/21 0627 04/18/21 1111  GLUCAP 141* 135* 122* 136* 146*    Lipid Profile: No results for input(s): CHOL, HDL, LDLCALC, TRIG, CHOLHDL, LDLDIRECT in the last 72 hours. Thyroid Function Tests: No results for input(s): TSH, T4TOTAL, FREET4, T3FREE, THYROIDAB  in the last 72 hours.  Anemia Panel: No results for input(s): VITAMINB12, FOLATE, FERRITIN, TIBC, IRON, RETICCTPCT in the last 72 hours.  Sepsis Labs: No results for input(s): PROCALCITON, LATICACIDVEN in the last 168 hours.   Recent Results (from the past 240 hour(s))  Blood culture (routine single)     Status: None   Collection Time: 04/10/21 10:53 AM   Specimen: BLOOD  Result Value Ref Range Status   Specimen Description   Final    BLOOD RIGHT ANTECUBITAL Performed at [facility] [address], [place], [place] [address]    Special Requests   Final    BOTTLES DRAWN AEROBIC AND ANAEROBIC Blood Culture results may not be optimal due to an excessive volume of blood received in culture bottles Performed at [facility] [address], [place], [place] [address]    Culture   Final    NO GROWTH 5 DAYS Performed at [facility], [place] [address] [place], [place] [address]    Report Status 04/15/2021 FINAL  Final  Resp Panel by RT-PCR (Flu A&B, Covid) Nasopharyngeal Swab     Status: None   Collection Time: 04/10/21 11:21 AM   Specimen: Nasopharyngeal Swab; Nasopharyngeal(NP) swabs in vial transport medium  Result Value Ref Range  Status   SARS Coronavirus 2 by RT PCR NEGATIVE NEGATIVE Final    Comment: (NOTE) SARS-CoV-2 target nucleic acids are NOT DETECTED.  The SARS-CoV-2 RNA is generally detectable in upper respiratory specimens during the acute phase of infection. The lowest concentration of SARS-CoV-2 viral copies this assay can detect is 138 copies/mL. A negative result does not preclude SARS-Cov-2 infection and should not be used as the sole basis for treatment or other patient management decisions. A negative result may occur with  improper specimen collection/handling, submission of specimen other than nasopharyngeal swab, presence of viral mutation(s) within the areas targeted by this assay, and inadequate number of viral copies(<138 copies/mL). A negative result must be combined with clinical observations, patient history, and epidemiological information. The expected result is Negative.  Fact Sheet for Patients:  EntrepreneurPulse.com.au  Fact Sheet for Healthcare Providers:  IncredibleEmployment.be  This test is no t yet approved or cleared by the [place] FDA and  has been authorized for detection and/or diagnosis of SARS-CoV-2 by FDA under an Emergency Use Authorization (EUA). This EUA will remain  in effect (meaning this test can be used) for the duration of the COVID-19 declaration under Section 564(b)(1) of the Act, 21 U.S.C.section 360bbb-3(b)(1), unless the authorization is terminated  or revoked sooner.       Influenza A by PCR NEGATIVE NEGATIVE Final   Influenza B by PCR NEGATIVE NEGATIVE Final    Comment: (NOTE) The Xpert Xpress SARS-CoV-2/FLU/RSV plus assay is intended as an aid in the diagnosis of influenza from Nasopharyngeal swab specimens and should not be used as a sole basis for treatment. Nasal washings and aspirates are unacceptable for Xpert Xpress SARS-CoV-2/FLU/RSV testing.  Fact Sheet for  Patients: EntrepreneurPulse.com.au  Fact Sheet for Healthcare Providers: IncredibleEmployment.be  This test is not yet approved or cleared by the [place] FDA and has been authorized for detection and/or diagnosis of SARS-CoV-2 by FDA under an Emergency Use Authorization (EUA). This EUA will remain in effect (meaning this test can be used) for the duration of the COVID-19 declaration under Section 564(b)(1) of the Act, 21 U.S.C. section 360bbb-3(b)(1), unless the authorization is terminated or revoked.  Performed at [facility], [place] [address] [place], [place] [address]   Urine culture     Status: Abnormal   Collection Time: 04/10/21  2:35 PM   Specimen: In/Out Cath Urine  Result Value Ref Range Status   Specimen Description   Final    IN/OUT CATH URINE Performed at [facility] [address], [place], [place] [address]    Special Requests   Final    NONE Performed at [facility], [place] [address], [place], [place] [address]    Culture >=100,000 COLONIES/mL KLEBSIELLA PNEUMONIAE (A)  Final   Report Status 04/13/2021 FINAL  Final   Organism ID, Bacteria KLEBSIELLA PNEUMONIAE (A)  Final      Susceptibility   Klebsiella pneumoniae - MIC*    AMPICILLIN RESISTANT Resistant     CEFAZOLIN <=4 SENSITIVE Sensitive     CEFEPIME <=0.12 SENSITIVE Sensitive     CEFTRIAXONE <=0.25 SENSITIVE Sensitive     CIPROFLOXACIN <=0.25 SENSITIVE Sensitive     GENTAMICIN <=1 SENSITIVE Sensitive     IMIPENEM <=0.25 SENSITIVE Sensitive     NITROFURANTOIN <=16 SENSITIVE Sensitive     TRIMETH/SULFA <=20 SENSITIVE Sensitive     AMPICILLIN/SULBACTAM <=2 SENSITIVE Sensitive     PIP/TAZO <=4 SENSITIVE Sensitive     * >=100,000 COLONIES/mL KLEBSIELLA PNEUMONIAE      Radiology Studies: No results found.  Scheduled Meds:  amLODipine  10 mg Oral Daily   aspirin EC  81 mg Oral q morning   busPIRone  5  mg Oral QHS   hydrALAZINE  50 mg Oral Q8H   insulin aspart  0-15 Units Subcutaneous TID WC   insulin aspart  0-5 Units Subcutaneous QHS   lactulose  10 g Oral Daily   memantine  10 mg Oral QHS   nystatin   Topical TID   potassium chloride  40 mEq Oral Daily   primidone  250 mg Oral BH-qamhs   sertraline  75 mg Oral QHS   sodium bicarbonate  650 mg Oral TID   Continuous Infusions:     LOS: 8 days   Marylu Lund, MD [facility] Pager On Amion  If 7PM-7AM, please contact night-coverage 04/18/2021, 2:01 PM

## 2021-04-18 NOTE — Plan of Care (Signed)
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
## 2021-04-19 DIAGNOSIS — E119 Type 2 diabetes mellitus without complications: Secondary | ICD-10-CM

## 2021-04-19 DIAGNOSIS — K7581 Nonalcoholic steatohepatitis (NASH): Secondary | ICD-10-CM

## 2021-04-19 DIAGNOSIS — K7460 Unspecified cirrhosis of liver: Secondary | ICD-10-CM

## 2021-04-19 DIAGNOSIS — Z794 Long term (current) use of insulin: Secondary | ICD-10-CM

## 2021-04-19 DIAGNOSIS — R627 Adult failure to thrive: Secondary | ICD-10-CM

## 2021-04-19 LAB — CBC
HCT: 31.2 % — ABNORMAL LOW (ref 36.0–46.0)
Hemoglobin: 10 g/dL — ABNORMAL LOW (ref 12.0–15.0)
MCH: 29.1 pg (ref 26.0–34.0)
MCHC: 32.1 g/dL (ref 30.0–36.0)
MCV: 90.7 fL (ref 80.0–100.0)
Platelets: 227 10*3/uL (ref 150–400)
RBC: 3.44 MIL/uL — ABNORMAL LOW (ref 3.87–5.11)
RDW: 15.4 % (ref 11.5–15.5)
WBC: 6.7 10*3/uL (ref 4.0–10.5)
nRBC: 0 % (ref 0.0–0.2)

## 2021-04-19 LAB — COMPREHENSIVE METABOLIC PANEL
ALT: 18 U/L (ref 0–44)
AST: 29 U/L (ref 15–41)
Albumin: 2.6 g/dL — ABNORMAL LOW (ref 3.5–5.0)
Alkaline Phosphatase: 63 U/L (ref 38–126)
Anion gap: 7 (ref 5–15)
BUN: 13 mg/dL (ref 8–23)
CO2: 18 mmol/L — ABNORMAL LOW (ref 22–32)
Calcium: 8.5 mg/dL — ABNORMAL LOW (ref 8.9–10.3)
Chloride: 112 mmol/L — ABNORMAL HIGH (ref 98–111)
Creatinine, Ser: 1.12 mg/dL — ABNORMAL HIGH (ref 0.44–1.00)
GFR, Estimated: 55 mL/min — ABNORMAL LOW (ref >60)
Glucose, Bld: 142 mg/dL — ABNORMAL HIGH (ref 70–99)
Potassium: 4.6 mmol/L (ref 3.5–5.1)
Sodium: 137 mmol/L (ref 135–145)
Total Bilirubin: 0.6 mg/dL (ref 0.3–1.2)
Total Protein: 6.9 g/dL (ref 6.5–8.1)

## 2021-04-19 LAB — GLUCOSE, CAPILLARY: Glucose-Capillary: 141 mg/dL — ABNORMAL HIGH (ref 70–99)

## 2021-04-19 LAB — RESP PANEL BY RT-PCR (FLU A&B, COVID) ARPGX2
Influenza A by PCR: NEGATIVE
Influenza B by PCR: NEGATIVE
SARS Coronavirus 2 by RT PCR: NEGATIVE

## 2021-04-19 MED ORDER — OLMESARTAN MEDOXOMIL 40 MG PO TABS: 40.0000 mg | ORAL_TABLET | Freq: Every day | ORAL | Status: DC

## 2021-04-19 MED ORDER — LACTULOSE 10 GM/15ML PO SOLN: 10.0000 g | Freq: Every day | ORAL | 0 refills | Status: DC

## 2021-04-19 MED ORDER — SODIUM BICARBONATE 650 MG PO TABS: 650.0000 mg | ORAL_TABLET | Freq: Two times a day (BID) | ORAL | 0 refills | Status: DC

## 2021-04-19 MED ORDER — SODIUM BICARBONATE 650 MG PO TABS: 650.0000 mg | ORAL_TABLET | Freq: Two times a day (BID) | ORAL | 0 refills | Status: AC

## 2021-04-19 MED ORDER — INSULIN ASPART 100 UNIT/ML IJ SOLN: INTRAMUSCULAR | 0 refills | Status: DC

## 2021-04-19 MED ORDER — ONDANSETRON 4 MG PO TBDP: 4.0000 mg | ORAL_TABLET | Freq: Three times a day (TID) | ORAL | Status: DC | PRN

## 2021-04-19 MED ORDER — OZEMPIC (1 MG/DOSE) 4 MG/3ML ~~LOC~~ SOPN: 1.0000 mg | PEN_INJECTOR | SUBCUTANEOUS | Status: DC

## 2021-04-19 NOTE — Progress Notes (Signed)
    Durable Medical Equipment  (From admission, onward)           Start     Ordered   04/19/21 1527  For home use only DME lightweight manual wheelchair with seat cushion  Once       Comments: Patient suffers from  weakness impairs their ability to perform daily activities like bathing, dressing, feeding, grooming, and toileting in the home.  A cane or crutch will not resolve  issue with performing activities of daily living. A wheelchair will allow patient to safely perform daily activities. Patient is not able to propel themselves in the home using a standard weight wheelchair due to general weakness. Patient can self propel in the lightweight wheelchair. Length of need Lifetime. Accessories: elevating leg rests (ELRs), wheel locks, extensions and anti-tippers.   04/19/21 1529   04/19/21 1525  For home use only DME Walker rolling  Once       Question Answer Comment  Walker: With [place]   Patient needs a walker to treat with the following condition Weakness      04/19/21 1525   04/19/21 1510  For home use only DME Bedside commode  Once       Question:  Patient needs a bedside commode to treat with the following condition  Answer:  FTT (failure to thrive) in adult   04/19/21 1509

## 2021-04-19 NOTE — Discharge Summary (Addendum)
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 20 13   CREATININE 4.63*   < > 2.58* 1.79* 1.31* 1.33* 1.12*  CALCIUM 7.4*   < > 9.1 9.0 8.8* 8.5* 8.5*  MG 1.9  --   --   --   --  1.2*  --    < > = values in this interval not displayed.   Liver Function Tests: Recent Labs  Lab 04/13/21 0511 04/17/21 0308 04/18/21 0254 04/19/21 0330  AST 35 22 21 29   ALT 22 17 15 18   ALKPHOS 73 78 69 63  BILITOT 0.6 0.9 0.5 0.6  PROT 6.0*  7.0 7.1 6.9  ALBUMIN 2.0* 2.5* 2.5* 2.6*   No results for input(s): LIPASE, AMYLASE in the last 168 hours. Recent Labs  Lab 04/14/21 1425  AMMONIA 22   CBC: Recent Labs  Lab 04/13/21 0511 04/19/21 0330  WBC 6.9 6.7  NEUTROABS 3.9  --   HGB 9.7* 10.0*  HCT 30.4* 31.2*  MCV 91.3 90.7  PLT 199 227   Cardiac Enzymes: No results for input(s): CKTOTAL, CKMB, CKMBINDEX, TROPONINI in the last 168 hours. BNP: BNP (last 3 results) No results for input(s): BNP in the last 8760 hours.  ProBNP (last 3 results) No results for input(s): PROBNP in the last 8760 hours.  CBG: Recent Labs  Lab 04/18/21 1111 04/18/21 1557 04/18/21 1601 04/18/21 2132 04/19/21 0600  GLUCAP 146* 184* 186* 136* 141*       Signed:  Florencia Reasons MD, PhD, FACP  [facility] 04/19/2021, 10:54 AM

## 2021-04-19 NOTE — Progress Notes (Signed)
Physical Therapy Note  PT session complete with full note to follow;  While SNF for post-acute rehab is still quite appropriate, Noted pt is declining SNF at this time;  Pt tells me she will go home to her daughter's where she can stay on main level of home, no steps to enter;  She will have her daughter's assistance, and granddaughter (64yo) and grandson (14yo) can help her when daughter is out; Recommend maximizing HH services for transition out of hospital;  HHPT/OT; RW, 3in1, wheelchair;  Will defer possible rec for a shower chair to HHPT/OT;   Roney Marion, [place]  Acute Rehabilitation Services Pager [phone] Office [phone]

## 2021-04-19 NOTE — Progress Notes (Signed)
D/C instructions given and reviewed. Tele and IV removed, tolerated well. Awaiting DME delivery.
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
## 2021-04-19 NOTE — TOC Transition Note (Signed)
Transition of Care [facility]) - CM/SW Discharge Note   Patient Details  Name: Carrie [name] MRN: [id number] Date of Birth: 1956/01/19  Transition of Care [facility]) CM/SW Contact:  Zenon Mayo, RN Phone Number: 04/19/2021, 3:55 PM   Clinical Narrative:    NCM spoke with patient at bedside, per MD patient is going home with [facility] services.  NCM offered choice, she is ok with [facility] for [place], [place].  NCM made referral to [facility] , she is able to take referral .  Soc will begin 24 to 48 hrs post dc.  Patient is going to her daughters house at [address] [place] [address].     Final next level of care: [place] Barriers to Discharge: No Barriers Identified   Patient Goals and CMS Choice Patient states their goals for this hospitalization and ongoing recovery are:: return home with [facility] CMS Medicare.gov Compare Post Acute Care list provided to:: Patient Choice offered to / list presented to : Patient  Discharge Placement                       Discharge Plan and Services     Post Acute Care Choice: [place]          DME Arranged: 3-N-1, Walker rolling, Youth worker wheelchair with seat cushion DME Agency: [employer] Date DME Agency Contacted: 04/19/21 Time DME Agency Contacted: [phone] Representative spoke with at DME Agency: Adela Lank HH Arranged: PT, OT [place] Agency: [place] Date [place]: 04/19/21 Time [place]: [place] Representative spoke with at [place]: Cyndi  Social Determinants of Health ([place]) Interventions     Readmission Risk Interventions No flowsheet data found.
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
## 2021-04-19 NOTE — Plan of Care (Signed)
  Problem: Education: Goal: Knowledge of General Education information will improve Description: Including pain rating scale, medication(s)/side effects and non-pharmacologic comfort measures Outcome: Adequate for Discharge   Problem: Health Behavior/Discharge Planning: Goal: Ability to manage health-related needs will improve Outcome: Adequate for Discharge   Problem: Clinical Measurements: Goal: Ability to maintain clinical measurements within normal limits will improve Outcome: Adequate for Discharge Goal: Diagnostic test results will improve Outcome: Adequate for Discharge Goal: Respiratory complications will improve Outcome: Adequate for Discharge Goal: Cardiovascular complication will be avoided Outcome: Adequate for Discharge

## 2021-04-19 NOTE — Plan of Care (Signed)
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
## 2021-05-04 ENCOUNTER — Other Ambulatory Visit: Payer: Self-pay | Admitting: Endocrinology

## 2021-06-04 ENCOUNTER — Other Ambulatory Visit: Payer: Self-pay | Admitting: Endocrinology

## 2021-06-06 ENCOUNTER — Telehealth: Payer: Self-pay | Admitting: Endocrinology

## 2021-06-06 NOTE — Telephone Encounter (Signed)
Pt is on ozempic. Pt is stating that pharmacy is stating that it is on back order  ozempic. Pt would like to know the alternatives would be. Pt would like a call back    [id number] [place]

## 2021-06-06 NOTE — Telephone Encounter (Signed)
Please advise 

## 2021-06-06 NOTE — Telephone Encounter (Signed)
Message sent thru [place]

## 2021-06-08 ENCOUNTER — Other Ambulatory Visit: Payer: Self-pay | Admitting: Endocrinology

## 2021-06-08 ENCOUNTER — Other Ambulatory Visit: Payer: Self-pay

## 2021-06-08 DIAGNOSIS — Z794 Long term (current) use of insulin: Secondary | ICD-10-CM

## 2021-06-08 DIAGNOSIS — E1142 Type 2 diabetes mellitus with diabetic polyneuropathy: Secondary | ICD-10-CM

## 2021-06-08 MED ORDER — OZEMPIC (1 MG/DOSE) 4 MG/3ML ~~LOC~~ SOPN: 1.0000 mg | PEN_INJECTOR | SUBCUTANEOUS | 3 refills | Status: DC

## 2021-06-08 NOTE — Telephone Encounter (Signed)
Wants the refill sent to the [facility] instead of [facility]

## 2021-06-08 NOTE — Telephone Encounter (Signed)
Pt is on ozempic. Pt is stating that pharmacy is stating that it is on back order  ozempic. Pt said we can send it to [facility]

## 2021-06-09 NOTE — Telephone Encounter (Signed)
Pt is now stating that the pharmacy has the ozempic and has to take the shot Monday

## 2021-06-12 ENCOUNTER — Other Ambulatory Visit: Payer: Self-pay

## 2021-06-12 DIAGNOSIS — Z794 Long term (current) use of insulin: Secondary | ICD-10-CM

## 2021-06-12 MED ORDER — OZEMPIC (1 MG/DOSE) 4 MG/3ML ~~LOC~~ SOPN: 1.0000 mg | PEN_INJECTOR | SUBCUTANEOUS | 3 refills | Status: DC

## 2021-06-12 NOTE — Telephone Encounter (Signed)
Pt is calling to follow up on the last message. Stating that she would like the prescription sent for the ozempic.

## 2021-06-21 ENCOUNTER — Ambulatory Visit: Payer: Self-pay | Admitting: Nurse Practitioner

## 2021-07-03 ENCOUNTER — Other Ambulatory Visit (HOSPITAL_COMMUNITY): Payer: Self-pay | Admitting: Orthopaedic Surgery

## 2021-07-03 ENCOUNTER — Other Ambulatory Visit: Payer: Self-pay | Admitting: Orthopaedic Surgery

## 2021-07-03 DIAGNOSIS — M25562 Pain in left knee: Secondary | ICD-10-CM

## 2021-07-12 ENCOUNTER — Other Ambulatory Visit (HOSPITAL_COMMUNITY): Payer: Medicare Other

## 2021-07-12 ENCOUNTER — Ambulatory Visit (HOSPITAL_COMMUNITY): Payer: Medicare Other

## 2021-07-12 ENCOUNTER — Encounter (HOSPITAL_COMMUNITY): Payer: Self-pay

## 2021-07-21 ENCOUNTER — Other Ambulatory Visit (HOSPITAL_COMMUNITY): Payer: Medicare Other

## 2021-07-27 ENCOUNTER — Other Ambulatory Visit: Payer: Self-pay

## 2021-07-27 ENCOUNTER — Encounter (HOSPITAL_COMMUNITY)
Admission: RE | Admit: 2021-07-27 | Discharge: 2021-07-27 | Disposition: A | Discharge status: Home / Self Care | Payer: Medicare Other | Source: Ambulatory Visit | Attending: Orthopaedic Surgery | Admitting: Orthopaedic Surgery

## 2021-07-27 DIAGNOSIS — M25562 Pain in left knee: Secondary | ICD-10-CM | POA: Insufficient documentation

## 2021-07-27 MED ORDER — TECHNETIUM TC 99M MEDRONATE IV KIT
20.0000 | PACK | Freq: Once | INTRAVENOUS | Status: AC | PRN
  Administered 2021-07-27: 20 via INTRAVENOUS

## 2021-08-30 ENCOUNTER — Other Ambulatory Visit: Payer: Self-pay | Admitting: Orthopaedic Surgery

## 2021-09-20 ENCOUNTER — Other Ambulatory Visit (HOSPITAL_COMMUNITY): Payer: Self-pay

## 2021-09-21 ENCOUNTER — Other Ambulatory Visit: Payer: Self-pay

## 2021-09-21 ENCOUNTER — Ambulatory Visit (INDEPENDENT_AMBULATORY_CARE_PROVIDER_SITE_OTHER): Payer: Medicare Other | Admitting: Endocrinology

## 2021-09-21 VITALS — BP 172/84 | HR 94 | Ht 60.0 in | Wt 146.8 lb

## 2021-09-21 DIAGNOSIS — Z794 Long term (current) use of insulin: Secondary | ICD-10-CM

## 2021-09-21 DIAGNOSIS — E1142 Type 2 diabetes mellitus with diabetic polyneuropathy: Secondary | ICD-10-CM

## 2021-09-21 LAB — POCT GLYCOSYLATED HEMOGLOBIN (HGB A1C): Hemoglobin A1C: 6.1 % — AB (ref 4.0–5.6)

## 2021-09-21 MED ORDER — METFORMIN HCL ER 500 MG PO TB24: 1000.0000 mg | ORAL_TABLET | Freq: Every day | ORAL | 3 refills | Status: DC

## 2021-09-21 NOTE — Progress Notes (Signed)
Subjective:    Patient ID: Carrie [name], female    DOB: [birth year], 65 y.o.   MRN: [id number]  HPI Pt returns for f/u of diabetes mellitus: DM type: 2 Dx'ed: 2010. 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   Therapy: metformin and Ozempic.   GDM: 2000.  DKA: never. Severe hypoglycemia: never.  Pancreatitis: never.  SDOH: she was on multiple daily injections, but could not remember to take.  Other: she did not tolerate invokana (vaginitis); psoriasis raises possibility she is evolving type 1 DM; edema limits rx options.  She took insulin 2011-2121.  Interval history: She says she never misses the medications. No recent steroids.  Pt says cbg varies from 65-157.   Past Medical History:  Diagnosis Date   Anxiety    Arthritis    psoriatic arithritis, DDD    Asthma    Depression    Diabetes mellitus    Gastritis    Gastroparesis    GERD (gastroesophageal reflux disease)    Hernia    Hyperlipemia    Hyperlipemia    Hypertension    Neuropathy    Bil feet re: to Diabetes   Obesity    Osteoporosis    Plantar fasciitis    Stricture and stenosis of esophagus    TIA (transient ischemic attack)    8-10 yrs ago, no problems since   Tuberculosis    tested positive 2011, no symptoms, was on medicine for 6 months    Past Surgical History:  Procedure Laterality Date   BREAST SURGERY Bilateral    biopsies both breast   CARDIAC CATHETERIZATION     CESAREAN SECTION     x 2    COLONOSCOPY     IR FLUORO GUIDE CV LINE RIGHT  04/12/2021   IR US GUIDE VASC ACCESS RIGHT  04/12/2021   JOINT REPLACEMENT     bilat knee    KNEE SURGERY Bilateral    x 2 joint knee replacements   UPPER GASTROINTESTINAL ENDOSCOPY     WISDOM TOOTH EXTRACTION      Social History   Socioeconomic History   Marital status: Single    Spouse name: Not on file   Number of children: 2   Years of education: Not on file   Highest education level: Not on file  Occupational History   Occupation: Disabled   Tobacco  Use   Smoking status: Never   Smokeless tobacco: Never  Vaping Use   Vaping Use: Never used  Substance and Sexual Activity   Alcohol use: Not Currently    Comment: socially   Drug use: No   Sexual activity: Not Currently    Birth control/protection: Post-menopausal  Other Topics Concern   Not on file  Social History Narrative   [place] daily    Social Determinants of Health   Financial Resource Strain: Not on file  Food Insecurity: Not on file  Transportation Needs: Not on file  Physical Activity: Not on file  Stress: Not on file  Social Connections: Not on file  Intimate Partner Violence: Not on file    Current Outpatient Medications on File Prior to Visit  Medication Sig Dispense Refill   Accu-Chek Softclix Lancets lancets Use as instructed to check blood sugar 2 times daily Dx E11.9 200 each 0   acetaminophen (TYLENOL) 650 MG CR tablet Take 1,300 mg by mouth every 8 (eight) hours as needed for pain.     albuterol (VENTOLIN HFA) 108 (90 Base) MCG/ACT inhaler Inhale 2 puffs  into the lungs every 4 (four) hours as needed for wheezing or shortness of breath.     Alcohol Swabs (B-D SINGLE USE SWABS REGULAR) PADS 1 each by Does not apply route 2 (two) times daily. Cleanse site BID for glucose testing; E11.9 200 each 0   amLODipine (NORVASC) 5 MG tablet Take 1 tablet (5 mg total) by mouth daily. 30 tablet 0   aspirin EC 81 MG tablet Take 81 mg by mouth every morning.     Blood Glucose Monitoring Suppl (ACCU-CHEK GUIDE ME) w/Device KIT Use as instructed to check blood sugar 2 times daily Dx E11.9 1 kit 0   busPIRone (BUSPAR) 5 MG tablet Take 5 mg by mouth 2 (two) times daily.     Calcium Carbonate-Vitamin D 600-400 MG-UNIT per tablet Take 1 tablet by mouth daily.     clobetasol cream (TEMOVATE) 2.77 % Apply 1 application topically 2 (two) times daily as needed (dry skin).     Cyanocobalamin (B-12) 2500 MCG TABS Take 2,500 mcg by mouth daily.     diltiazem (CARDIZEM CD) 180 MG 24 hr  capsule Take 180 mg by mouth daily.     esomeprazole (NEXIUM) 40 MG capsule Take 20 mg by mouth every morning.     FLOVENT HFA 110 MCG/ACT inhaler Inhale 1 puff into the lungs daily.     FLUoxetine (PROZAC) 10 MG capsule Take 10 mg by mouth in the morning and at bedtime.     glucose blood (ACCU-CHEK GUIDE) test strip Use as instructed to check blood sugar 2 times daily Dx E11.9 100 each 5   lactulose (CHRONULAC) 10 GM/15ML solution Take 15 mLs (10 g total) by mouth daily. 236 mL 0   memantine (NAMENDA) 10 MG tablet Take 10 mg by mouth 2 (two) times daily.     olmesartan (BENICAR) 40 MG tablet Take 1 tablet (40 mg total) by mouth daily. Please hold this medication, repeat kidney function at your pcp 's office , pcp to decide on when to resume olmesartan.     ondansetron (ZOFRAN ODT) 4 MG disintegrating tablet Take 1 tablet (4 mg total) by mouth every 8 (eight) hours as needed for nausea.     pravastatin (PRAVACHOL) 40 MG tablet Take 40 mg by mouth daily.     primidone (MYSOLINE) 250 MG tablet Take 250 mg by mouth in the morning and at bedtime.      Semaglutide, 1 MG/DOSE, (OZEMPIC, 1 MG/DOSE,) 4 MG/3ML SOPN Inject 1 mg as directed once a week. 9 mL 3   TREMFYA 100 MG/ML SOPN Inject 100 mg into the skin every 30 (thirty) days.     Vitamin D, Ergocalciferol, (DRISDOL) 1.25 MG (50000 UNIT) CAPS capsule Take 50,000 Units by mouth every 7 (seven) days.     No current facility-administered medications on file prior to visit.    Allergies  Allergen Reactions   Lisinopril Cough    Family History  Problem Relation Age of Onset   Diabetes Other        maternal aunts and uncles   Diabetes Father    Liver disease Father    Emphysema Father        smoked   Diabetes Sister    Diabetes Brother    Sarcoidosis Brother    Emphysema Mother        smoked   Asthma Mother    Asthma Brother    Colon cancer Neg Hx    Esophageal cancer Neg Hx  Stomach cancer Neg Hx    Rectal cancer Neg Hx     BP  (!) 172/84   Pulse 94   Ht 5' (1.524 m)   Wt 146 lb 12.8 oz (66.6 kg)   LMP  (LMP Unknown)   SpO2 95%   BMI 28.67 kg/m    Review of Systems Denies N/V/HB    Objective:   Physical Exam  Lab Results  Component Value Date   CREATININE 1.12 (H) 04/19/2021   BUN 13 04/19/2021   NA 137 04/19/2021   K 4.6 04/19/2021   CL 112 (H) 04/19/2021   CO2 18 (L) 04/19/2021    Lab Results  Component Value Date   HGBA1C 6.1 (A) 09/21/2021      Assessment & Plan:  Type 2 DM: well-controlled Hypoglycemia, due to meds.  We'll try changing metformin to -ER  Patient Instructions  Your blood pressure is high today.  Please see your primary care provider soon, to have it rechecked.   I have sent a prescription to your pharmacy, to change metformin to extended-release.  Please call if the low blood sugar persists, and we'll reduce it.  Please continue the same Ozempic.   check your blood sugar once a day.  vary the time of day when you check, between before the 3 meals, and at bedtime.  also check if you have symptoms of your blood sugar being too high or too low.  please keep a record of the readings and bring it to your next appointment here (or you can bring the meter itself).  You can write it on any piece of paper.  please call us sooner if your blood sugar goes below 70, or if you have a lot of readings over 200.   Please come back for a follow-up appointment in 6 months.

## 2021-09-21 NOTE — Patient Instructions (Addendum)
Your blood pressure is high today.  Please see your primary care provider soon, to have it rechecked.   I have sent a prescription to your pharmacy, to change metformin to extended-release.  Please call if the low blood sugar persists, and we'll reduce it.  Please continue the same Ozempic.   check your blood sugar once a day.  vary the time of day when you check, between before the 3 meals, and at bedtime.  also check if you have symptoms of your blood sugar being too high or too low.  please keep a record of the readings and bring it to your next appointment here (or you can bring the meter itself).  You can write it on any piece of paper.  please call us sooner if your blood sugar goes below 70, or if you have a lot of readings over 200.   Please come back for a follow-up appointment in 6 months.

## 2021-09-22 NOTE — Progress Notes (Addendum)
COVID swab appointment: 09/29/21  COVID Vaccine Completed: yes x3 Date COVID Vaccine completed: Has received booster: COVID vaccine manufacturer: [employer]   Date of COVID positive in last 90 days: no  PCP - Jerrell Belfast, PA Cardiologist - n/a  Clearance??  Chest x-ray - CT 05/04/21 [facility] EKG - 04/17/21 Epic Stress Test - 12/05/13 Epic ECHO - 05/10/21 Care [facility]where Cardiac Cath - pt states long time ago Pacemaker/ICD device last checked: n/a Spinal Cord Stimulator: n/a  Sleep Study - n/a CPAP -   Fasting Blood Sugar - 60-150 Checks Blood Sugar _1_ times a day  Blood Thinner Instructions: Aspirin Instructions: ASA 81, 2 weeks Last Dose: 09/19/21  Activity level: Can go up a flight of stairs and perform activities of daily living without stopping and without symptoms of chest pain. SOB with exertion, pt states PCP is aware       Anesthesia review: TIA, HTN, DM 2, esophageal stricture, CP, artery stenosis, asthma, cirrhosis   Patient denies shortness of breath, fever, cough and chest pain at PAT appointment   Patient verbalized understanding of instructions that were given to them at the PAT appointment. Patient was also instructed that they will need to review over the PAT instructions again at home before surgery.

## 2021-09-22 NOTE — Patient Instructions (Addendum)
DUE TO COVID-19 ONLY ONE VISITOR IS ALLOWED TO COME WITH YOU AND STAY IN THE WAITING ROOM ONLY DURING PRE OP AND PROCEDURE.   **NO VISITORS ARE ALLOWED IN THE SHORT STAY AREA OR RECOVERY ROOM!!**  IF YOU WILL BE ADMITTED INTO THE HOSPITAL YOU ARE ALLOWED ONLY TWO SUPPORT PEOPLE DURING VISITATION HOURS ONLY (7AM -8PM)   The support person(s) may change daily. The support person(s) must pass our screening, gel in and out, and wear a mask at all times, including in the patient's room. Patients must also wear a mask when staff or their support person are in the room.  No visitors under the age of 41. Any visitor under the age of 57 must be accompanied by an adult.    COVID SWAB TESTING MUST BE COMPLETED ON:  09/29/21 **MUST PRESENT COMPLETED FORM AT TESTING SITE**    [facility] (backside of the building) Open 8am-3pm. No appointment needed You are not required to quarantine, however you are required to wear a well-fitted mask when you are out and around people not in your household.  Hand Hygiene often Do NOT share personal items Notify your provider if you are in close contact with someone who has COVID or you develop fever 100.4 or greater, new onset of sneezing, cough, sore throat, shortness of breath or body aches.       Your procedure is scheduled on: 10/03/21   Report to [facility] Main Entrance    Report to admitting at 7:15 AM   Call this number if you have problems the morning of surgery [phone]   Do not eat food :After Midnight.   May have liquids until 7:00 AM day of surgery  CLEAR LIQUID DIET  Foods Allowed                                                                     Foods Excluded  Water, Black Coffee and tea (no milk or creamer)          liquids that you cannot  Plain Jell-O in any flavor  (No red)                                   see through such as: Fruit ices (not with fruit pulp)                                           milk, soups, orange juice              Iced Popsicles (No red)                                              All solid food                                   Apple juices Sports drinks like  Gatorade (No red) Lightly seasoned clear broth or consume(fat free) Sugar      The day of surgery:  Drink ONE (1) Pre-Surgery G2 by 7:00 am the morning of surgery. Drink in one sitting. Do not sip.  This drink was given to you during your hospital  pre-op appointment visit. Nothing else to drink after completing the  Pre-Surgery G2.          If you have questions, please contact your surgeon's office.     Oral Hygiene is also important to reduce your risk of infection.                                    Remember - BRUSH YOUR TEETH THE MORNING OF SURGERY WITH YOUR REGULAR TOOTHPASTE   Take these medicines the morning of surgery with A SIP OF WATER: Tylenol, Amlodipine, Buspar, Diltiazem, Nexium, Prozac, Pravastatin   DO NOT TAKE ANY ORAL DIABETIC MEDICATIONS DAY OF YOUR SURGERY  How to Manage Your Diabetes Before and After Surgery  Why is it important to control my blood sugar before and after surgery? Improving blood sugar levels before and after surgery helps healing and can limit problems. A way of improving blood sugar control is eating a healthy diet by:  Eating less sugar and carbohydrates  Increasing activity/exercise  Talking with your doctor about reaching your blood sugar goals High blood sugars (greater than 180 mg/dL) can raise your risk of infections and slow your recovery, so you will need to focus on controlling your diabetes during the weeks before surgery. Make sure that the doctor who takes care of your diabetes knows about your planned surgery including the date and location.  How do I manage my blood sugar before surgery? Check your blood sugar at least 4 times a day, starting 2 days before surgery, to make sure that the level is not too high or low. Check your blood  sugar the morning of your surgery when you wake up and every 2 hours until you get to the Short Stay unit. If your blood sugar is less than 70 mg/dL, you will need to treat for low blood sugar: Do not take insulin. Treat a low blood sugar (less than 70 mg/dL) with  cup of clear juice (cranberry or apple), 4 glucose tablets, OR glucose gel. Recheck blood sugar in 15 minutes after treatment (to make sure it is greater than 70 mg/dL). If your blood sugar is not greater than 70 mg/dL on recheck, call [phone] for further instructions. Report your blood sugar to the short stay nurse when you get to Short Stay.  If you are admitted to the hospital after surgery: Your blood sugar will be checked by the staff and you will probably be given insulin after surgery (instead of oral diabetes medicines) to make sure you have good blood sugar levels. The goal for blood sugar control after surgery is 80-180 mg/dL.   WHAT DO I DO ABOUT MY DIABETES MEDICATION?  Do not take oral diabetes medicines (pills) the morning of surgery.  THE DAY BEFORE SURGERY, take Metformin as prescribed        THE MORNING OF SURGERY, do not take Metformin     Reviewed and Endorsed by [facility] Patient Education Committee, August 2015  You may not have any metal on your body including hair pins, jewelry, and body piercing             Do not wear make-up, lotions, powders, perfumes, or deodorant  Do not wear nail polish including gel and S&S, artificial/acrylic nails, or any other type of covering on natural nails including finger and toenails. If you have artificial nails, gel coating, etc. that needs to be removed by a nail salon please have this removed prior to surgery or surgery may need to be canceled/ delayed if the surgeon/ anesthesia feels like they are unable to be safely monitored.   Do not shave  48 hours prior to surgery.    Do not bring valuables to the hospital. [place].   Contacts, dentures or bridgework may not be worn into surgery.   Bring small overnight bag day of surgery.              Please read over the following fact sheets you were given: IF YOU HAVE QUESTIONS ABOUT YOUR PRE-OP INSTRUCTIONS PLEASE CALL [phone]   [facility] - Preparing for Surgery Before surgery, you can play an important role.  Because skin is not sterile, your skin needs to be as free of germs as possible.  You can reduce the number of germs on your skin by washing with CHG (chlorahexidine gluconate) soap before surgery.  CHG is an antiseptic cleaner which kills germs and bonds with the skin to continue killing germs even after washing. Please DO NOT use if you have an allergy to CHG or antibacterial soaps.  If your skin becomes reddened/irritated stop using the CHG and inform your nurse when you arrive at Short Stay. Do not shave (including legs and underarms) for at least 48 hours prior to the first CHG shower.  You may shave your face/neck.  Please follow these instructions carefully:  1.  Shower with CHG Soap the night before surgery and the  morning of surgery.  2.  If you choose to wash your hair, wash your hair first as usual with your normal  shampoo.  3.  After you shampoo, rinse your hair and body thoroughly to remove the shampoo.                             4.  Use CHG as you would any other liquid soap.  You can apply chg directly to the skin and wash.  Gently with a scrungie or clean washcloth.  5.  Apply the CHG Soap to your body ONLY FROM THE NECK DOWN.   Do   not use on face/ open                           Wound or open sores. Avoid contact with eyes, ears mouth and   genitals (private parts).                       Wash face,  Genitals (private parts) with your normal soap.             6.  Wash thoroughly, paying special attention to the area where your    surgery  will be performed.  7.  Thoroughly rinse your  body with warm water from the neck  down.  8.  DO NOT shower/wash with your normal soap after using and rinsing off the CHG Soap.                9.  Pat yourself dry with a clean towel.            10.  Wear clean pajamas.            11.  Place clean sheets on your bed the night of your first shower and do not  sleep with pets. Day of Surgery : Do not apply any lotions/deodorants the morning of surgery.  Please wear clean clothes to the hospital/surgery center.  FAILURE TO FOLLOW THESE INSTRUCTIONS MAY RESULT IN THE CANCELLATION OF YOUR SURGERY  PATIENT SIGNATURE_________________________________  NURSE SIGNATURE__________________________________  ________________________________________________________________________   Adam Phenix  An incentive spirometer is a tool that can help keep your lungs clear and active. This tool measures how well you are filling your lungs with each breath. Taking long deep breaths may help reverse or decrease the chance of developing breathing (pulmonary) problems (especially infection) following: A long period of time when you are unable to move or be active. BEFORE THE PROCEDURE  If the spirometer includes an indicator to show your best effort, your nurse or respiratory therapist will set it to a desired goal. If possible, sit up straight or lean slightly forward. Try not to slouch. Hold the incentive spirometer in an upright position. INSTRUCTIONS FOR USE  Sit on the edge of your bed if possible, or sit up as far as you can in bed or on a chair. Hold the incentive spirometer in an upright position. Breathe out normally. Place the mouthpiece in your mouth and seal your lips tightly around it. Breathe in slowly and as deeply as possible, raising the piston or the ball toward the top of the column. Hold your breath for 3-5 seconds or for as long as possible. Allow the piston or ball to fall to the bottom of the column. Remove the mouthpiece from your  mouth and breathe out normally. Rest for a few seconds and repeat Steps 1 through 7 at least 10 times every 1-2 hours when you are awake. Take your time and take a few normal breaths between deep breaths. The spirometer may include an indicator to show your best effort. Use the indicator as a goal to work toward during each repetition. After each set of 10 deep breaths, practice coughing to be sure your lungs are clear. If you have an incision (the cut made at the time of surgery), support your incision when coughing by placing a pillow or rolled up towels firmly against it. Once you are able to get out of bed, walk around indoors and cough well. You may stop using the incentive spirometer when instructed by your caregiver.  RISKS AND COMPLICATIONS Take your time so you do not get dizzy or light-headed. If you are in pain, you may need to take or ask for pain medication before doing incentive spirometry. It is harder to take a deep breath if you are having pain. AFTER USE Rest and breathe slowly and easily. It can be helpful to keep track of a log of your progress. Your caregiver can provide you with a simple table to help with this. If you are using the spirometer at home, follow these instructions: [place] IF:  You are having difficultly using the spirometer. You have trouble using the spirometer as often as instructed. Your  pain medication is not giving enough relief while using the spirometer. You develop fever of 100.5 F (38.1 C) or higher. SEEK IMMEDIATE MEDICAL CARE IF:  You cough up bloody sputum that had not been present before. You develop fever of 102 F (38.9 C) or greater. You develop worsening pain at or near the incision site. MAKE SURE YOU:  Understand these instructions. Will watch your condition. Will get help right away if you are not doing well or get worse. Document Released: 02/11/2007 Document Revised: 12/24/2011 Document Reviewed: 04/14/2007 ExitCare  Patient Information 2014 [employer], [place].   ________________________________________________________________________  WHAT IS A BLOOD TRANSFUSION? Blood Transfusion Information  A transfusion is the replacement of blood or some of its parts. Blood is made up of multiple cells which provide different functions. Red blood cells carry oxygen and are used for blood loss replacement. White blood cells fight against infection. Platelets control bleeding. Plasma helps clot blood. Other blood products are available for specialized needs, such as hemophilia or other clotting disorders. BEFORE THE TRANSFUSION  Who gives blood for transfusions?  Healthy volunteers who are fully evaluated to make sure their blood is safe. This is blood bank blood. Transfusion therapy is the safest it has ever been in the practice of medicine. Before blood is taken from a donor, a complete history is taken to make sure that person has no history of diseases nor engages in risky social behavior (examples are intravenous drug use or sexual activity with multiple partners). The donor's travel history is screened to minimize risk of transmitting infections, such as malaria. The donated blood is tested for signs of infectious diseases, such as HIV and hepatitis. The blood is then tested to be sure it is compatible with you in order to minimize the chance of a transfusion reaction. If you or a relative donates blood, this is often done in anticipation of surgery and is not appropriate for emergency situations. It takes many days to process the donated blood. RISKS AND COMPLICATIONS Although transfusion therapy is very safe and saves many lives, the main dangers of transfusion include:  Getting an infectious disease. Developing a transfusion reaction. This is an allergic reaction to something in the blood you were given. Every precaution is taken to prevent this. The decision to have a blood transfusion has been considered carefully  by your caregiver before blood is given. Blood is not given unless the benefits outweigh the risks. AFTER THE TRANSFUSION Right after receiving a blood transfusion, you will usually feel much better and more energetic. This is especially true if your red blood cells have gotten low (anemic). The transfusion raises the level of the red blood cells which carry oxygen, and this usually causes an energy increase. The nurse administering the transfusion will monitor you carefully for complications. HOME CARE INSTRUCTIONS  No special instructions are needed after a transfusion. You may find your energy is better. Speak with your caregiver about any limitations on activity for underlying diseases you may have. SEEK MEDICAL CARE IF:  Your condition is not improving after your transfusion. You develop redness or irritation at the intravenous (IV) site. SEEK IMMEDIATE MEDICAL CARE IF:  Any of the following symptoms occur over the next 12 hours: Shaking chills. You have a temperature by mouth above 102 F (38.9 C), not controlled by medicine. Chest, back, or muscle pain. People around you feel you are not acting correctly or are confused. Shortness of breath or difficulty breathing. Dizziness and fainting. You get a rash  or develop hives. You have a decrease in urine output. Your urine turns a dark color or changes to pink, red, or brown. Any of the following symptoms occur over the next 10 days: You have a temperature by mouth above 102 F (38.9 C), not controlled by medicine. Shortness of breath. Weakness after normal activity. The white part of the eye turns yellow (jaundice). You have a decrease in the amount of urine or are urinating less often. Your urine turns a dark color or changes to pink, red, or brown. Document Released: 09/28/2000 Document Revised: 12/24/2011 Document Reviewed: 05/17/2008 [facility] Patient Information 2014 [place],  [place].  _______________________________________________________________________

## 2021-09-25 ENCOUNTER — Encounter (HOSPITAL_COMMUNITY): Payer: Self-pay

## 2021-09-25 ENCOUNTER — Encounter (HOSPITAL_COMMUNITY)
Admission: RE | Admit: 2021-09-25 | Discharge: 2021-09-25 | Disposition: A | Discharge status: Home / Self Care | Payer: Medicare Other | Source: Ambulatory Visit | Attending: Orthopaedic Surgery | Admitting: Orthopaedic Surgery

## 2021-09-25 ENCOUNTER — Other Ambulatory Visit: Payer: Self-pay

## 2021-09-25 VITALS — BP 164/92 | HR 92 | Temp 98.0°F | Resp 20 | Ht 60.0 in

## 2021-09-25 DIAGNOSIS — Z01818 Encounter for other preprocedural examination: Secondary | ICD-10-CM

## 2021-09-25 DIAGNOSIS — Z01812 Encounter for preprocedural laboratory examination: Secondary | ICD-10-CM | POA: Diagnosis not present

## 2021-09-25 DIAGNOSIS — K769 Liver disease, unspecified: Secondary | ICD-10-CM | POA: Insufficient documentation

## 2021-09-25 HISTORY — DX: Unspecified cirrhosis of liver: K74.60

## 2021-09-25 LAB — CBC WITH DIFFERENTIAL/PLATELET
Abs Immature Granulocytes: 0.02 10*3/uL (ref 0.00–0.07)
Basophils Absolute: 0.1 10*3/uL (ref 0.0–0.1)
Basophils Relative: 1 %
Eosinophils Absolute: 0.2 10*3/uL (ref 0.0–0.5)
Eosinophils Relative: 3 %
HCT: 34.9 % — ABNORMAL LOW (ref 36.0–46.0)
Hemoglobin: 11.1 g/dL — ABNORMAL LOW (ref 12.0–15.0)
Immature Granulocytes: 0 %
Lymphocytes Relative: 37 %
Lymphs Abs: 2.6 10*3/uL (ref 0.7–4.0)
MCH: 27.3 pg (ref 26.0–34.0)
MCHC: 31.8 g/dL (ref 30.0–36.0)
MCV: 85.7 fL (ref 80.0–100.0)
Monocytes Absolute: 0.6 10*3/uL (ref 0.1–1.0)
Monocytes Relative: 8 %
Neutro Abs: 3.5 10*3/uL (ref 1.7–7.7)
Neutrophils Relative %: 51 %
Platelets: 257 10*3/uL (ref 150–400)
RBC: 4.07 MIL/uL (ref 3.87–5.11)
RDW: 14.1 % (ref 11.5–15.5)
WBC: 7 10*3/uL (ref 4.0–10.5)
nRBC: 0 % (ref 0.0–0.2)

## 2021-09-25 LAB — URINALYSIS, ROUTINE W REFLEX MICROSCOPIC
Bacteria, UA: NONE SEEN
Bilirubin Urine: NEGATIVE
Glucose, UA: NEGATIVE mg/dL
Ketones, ur: NEGATIVE mg/dL
Leukocytes,Ua: NEGATIVE
Nitrite: NEGATIVE
Protein, ur: NEGATIVE mg/dL
Specific Gravity, Urine: 1.016 (ref 1.005–1.030)
pH: 5 (ref 5.0–8.0)

## 2021-09-25 LAB — GLUCOSE, CAPILLARY: Glucose-Capillary: 130 mg/dL — ABNORMAL HIGH (ref 70–99)

## 2021-09-25 LAB — COMPREHENSIVE METABOLIC PANEL
ALT: 12 U/L (ref 0–44)
AST: 15 U/L (ref 15–41)
Albumin: 3.5 g/dL (ref 3.5–5.0)
Alkaline Phosphatase: 66 U/L (ref 38–126)
Anion gap: 9 (ref 5–15)
BUN: 13 mg/dL (ref 8–23)
CO2: 25 mmol/L (ref 22–32)
Calcium: 8.6 mg/dL — ABNORMAL LOW (ref 8.9–10.3)
Chloride: 102 mmol/L (ref 98–111)
Creatinine, Ser: 0.91 mg/dL (ref 0.44–1.00)
GFR, Estimated: >60 mL/min (ref >60)
Glucose, Bld: 116 mg/dL — ABNORMAL HIGH (ref 70–99)
Potassium: 3.5 mmol/L (ref 3.5–5.1)
Sodium: 136 mmol/L (ref 135–145)
Total Bilirubin: 0.3 mg/dL (ref 0.3–1.2)
Total Protein: 8.4 g/dL — ABNORMAL HIGH (ref 6.5–8.1)

## 2021-09-25 LAB — SURGICAL PCR SCREEN
MRSA, PCR: NEGATIVE
Staphylococcus aureus: NEGATIVE

## 2021-09-29 ENCOUNTER — Other Ambulatory Visit: Payer: Self-pay | Admitting: Orthopaedic Surgery

## 2021-09-29 LAB — SARS CORONAVIRUS 2 (TAT 6-24 HRS): SARS Coronavirus 2: NEGATIVE

## 2021-10-02 ENCOUNTER — Other Ambulatory Visit: Payer: Self-pay | Admitting: Orthopaedic Surgery

## 2021-10-02 NOTE — Anesthesia Preprocedure Evaluation (Addendum)
Anesthesia Evaluation  Patient identified by MRN, date of birth, ID band Patient awake    Reviewed: Allergy & Precautions, NPO status , Patient's Chart, lab work & pertinent test results  Airway Mallampati: III  TM Distance: >3 FB Neck ROM: Full    Dental  (+) Edentulous Upper, Edentulous Lower   Pulmonary asthma (well controlled, used inhaler last about a month ago) ,    Pulmonary exam normal breath sounds clear to auscultation       Cardiovascular hypertension (163/79 in preop- normally 120-130SBP), Pt. 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)    GI/Hepatic GERD  Medicated and Controlled,(+) Cirrhosis  (denies ascites or esophageal banding )      , Hx esophageal stricture   Endo/Other  diabetes, Well Controlled, Type 2, Oral Hypoglycemic AgentsLast a1c 6.1  Renal/GU negative Renal ROS  negative genitourinary   Musculoskeletal  (+) Arthritis , Osteoarthritis,    Abdominal   Peds  Hematology  (+) Blood dyscrasia, anemia , hct 34.9, plt 257   Anesthesia Other Findings   Reproductive/Obstetrics negative OB ROS                            Anesthesia Physical Anesthesia Plan  ASA: 2  Anesthesia Plan: Spinal, Regional and MAC   Post-op Pain Management: Tylenol PO (pre-op)   Induction:   PONV Risk Score and Plan: 2 and Propofol infusion and TIVA  Airway Management Planned: Natural Airway and Nasal Cannula  Additional Equipment: None  Intra-op Plan:   Post-operative Plan:   Informed Consent: I have reviewed the patients History and Physical, chart, labs and discussed the procedure including the risks, benefits and alternatives for the proposed anesthesia with the patient or authorized representative who has  indicated his/her understanding and acceptance.       Plan Discussed with: CRNA  Anesthesia Plan Comments:         Anesthesia Quick Evaluation

## 2021-10-02 NOTE — H&P (Signed)
TOTAL KNEE REVISION ADMISSION H&P  Patient is being admitted for left revision total knee arthroplasty.  Subjective:  Chief Complaint:left knee pain.  HPI: Carrie [name], 65 y.o. female, has a history of pain and functional disability in the left knee(s) due to failed previous arthroplasty and patient has failed non-surgical conservative treatments for greater than 12 weeks to include flexibility and strengthening excercises, supervised PT with diminished ADL's post treatment, use of assistive devices, weight reduction as appropriate, and activity modification. The indications for the revision of the total knee arthroplasty are history of total knee infection. Onset of symptoms was gradual starting 1 years ago with gradually worsening course since that time.  Prior procedures on the left knee(s) include arthroplasty.  Patient currently rates pain in the left knee(s) at 10 out of 10 with activity. There is night pain, worsening of pain with activity and weight bearing, pain that interferes with activities of daily living, pain with passive range of motion, and joint swelling.  Patient has evidence of  a well-seated well aligned TKR prosthesis with no acute bony abnormalities  by imaging studies. This condition presents safety issues increasing the risk of falls. There is no current active infection.  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   Psoriasis 07/08/2017   Diabetes ([place]) 02/16/2016   Chest pain 12/04/2013   Diabetes mellitus type II, uncontrolled 12/04/2013   HTN (hypertension) 12/04/2013   Hypoglycemia 12/04/2013   Pyelonephritis 10/10/2013   UTI (lower urinary tract infection) 10/10/2013   HBP (high blood pressure) 04/21/2013    Gastroparesis 02/25/2013   Cough 02/25/2013   Nausea and vomiting 01/06/2013   Nausea alone 05/15/2011   STRICTURE AND STENOSIS OF ESOPHAGUS 10/21/2009   Hyperlipidemia 01/09/2008   OBESITY 01/09/2008   Asthma 01/09/2008   GERD 01/09/2008   HERNIA, VENTRAL 01/09/2008   ARTHRITIS 01/09/2008   Depression 03/25/2007   ANXIETY 03/25/2007   Past Medical History:  Diagnosis Date   Anxiety    Arthritis    psoriatic arithritis, DDD    Asthma    Depression    Diabetes mellitus    Gastritis    Gastroparesis    GERD (gastroesophageal reflux disease)    Hernia    Hyperlipemia    Hyperlipemia    Hypertension    Liver cirrhosis ([place])    Neuropathy    Bil feet re: to Diabetes   Obesity    Osteoporosis    Plantar fasciitis    Stricture and stenosis of esophagus    TIA (transient ischemic attack)    8-10 yrs ago, no problems since   Tuberculosis    tested positive 2011, no symptoms, was on medicine for 6 months    Past Surgical History:  Procedure Laterality Date   BREAST SURGERY Bilateral    biopsies both breast   CARDIAC CATHETERIZATION     CESAREAN SECTION     x 2    CHOLECYSTECTOMY     COLONOSCOPY     FOOT SURGERY Left    spur removal   HERNIA REPAIR     IR FLUORO GUIDE CV LINE RIGHT  04/12/2021   IR US GUIDE VASC ACCESS RIGHT  04/12/2021   JOINT REPLACEMENT     bilat knee    KNEE SURGERY  Bilateral    x 2 joint knee replacements   UPPER GASTROINTESTINAL ENDOSCOPY     WISDOM TOOTH EXTRACTION     WISDOM TOOTH EXTRACTION      No current facility-administered medications for this encounter.   Current Outpatient Medications  Medication Sig Dispense Refill Last Dose   acetaminophen (TYLENOL) 650 MG CR tablet Take 1,300 mg by mouth every 8 (eight) hours as needed for pain.      albuterol (VENTOLIN HFA) 108 (90 Base) MCG/ACT inhaler Inhale 2 puffs into the lungs every 4 (four) hours as needed for wheezing or shortness of breath.      aspirin EC 81 MG tablet Take 81 mg  by mouth every morning.      busPIRone (BUSPAR) 5 MG tablet Take 5 mg by mouth 2 (two) times daily.      Calcium Carbonate-Vitamin D 600-400 MG-UNIT per tablet Take 1 tablet by mouth daily.      clobetasol cream (TEMOVATE) 5.46 % Apply 1 application topically 2 (two) times daily as needed (dry skin).      Cyanocobalamin (B-12) 2500 MCG TABS Take 2,500 mcg by mouth daily.      diltiazem (CARDIZEM CD) 180 MG 24 hr capsule Take 180 mg by mouth daily.      esomeprazole (NEXIUM) 40 MG capsule Take 20 mg by mouth every morning.      FLOVENT HFA 110 MCG/ACT inhaler Inhale 1 puff into the lungs daily.      FLUoxetine (PROZAC) 10 MG capsule Take 10 mg by mouth in the morning and at bedtime.      memantine (NAMENDA) 10 MG tablet Take 10 mg by mouth 2 (two) times daily.      olmesartan (BENICAR) 40 MG tablet Take 1 tablet (40 mg total) by mouth daily. Please hold this medication, repeat kidney function at your pcp 's office , pcp to decide on when to resume olmesartan.      pravastatin (PRAVACHOL) 40 MG tablet Take 40 mg by mouth daily.      primidone (MYSOLINE) 250 MG tablet Take 250 mg by mouth in the morning and at bedtime.       Semaglutide, 1 MG/DOSE, (OZEMPIC, 1 MG/DOSE,) 4 MG/3ML SOPN Inject 1 mg as directed once a week. 9 mL 3    TREMFYA 100 MG/ML SOPN Inject 100 mg into the skin every 30 (thirty) days.      Vitamin D, Ergocalciferol, (DRISDOL) 1.25 MG (50000 UNIT) CAPS capsule Take 50,000 Units by mouth every 7 (seven) days.      Accu-Chek Softclix Lancets lancets Use as instructed to check blood sugar 2 times daily Dx E11.9 200 each 0    Alcohol Swabs (B-D SINGLE USE SWABS REGULAR) PADS 1 each by Does not apply route 2 (two) times daily. Cleanse site BID for glucose testing; E11.9 200 each 0    amLODipine (NORVASC) 5 MG tablet Take 1 tablet (5 mg total) by mouth daily. 30 tablet 0 Not Taking   Blood Glucose Monitoring Suppl (ACCU-CHEK GUIDE ME) w/Device KIT Use as instructed to check blood sugar 2  times daily Dx E11.9 1 kit 0    glucose blood (ACCU-CHEK GUIDE) test strip Use as instructed to check blood sugar 2 times daily Dx E11.9 100 each 5    lactulose (CHRONULAC) 10 GM/15ML solution Take 15 mLs (10 g total) by mouth daily. 236 mL 0 Not Taking   metFORMIN (GLUCOPHAGE-XR) 500 MG 24 hr tablet Take 2 tablets (1,000 mg total)  by mouth daily. 180 tablet 3    ondansetron (ZOFRAN ODT) 4 MG disintegrating tablet Take 1 tablet (4 mg total) by mouth every 8 (eight) hours as needed for nausea.   Not Taking   Allergies  Allergen Reactions   Lisinopril Cough    Social History   Tobacco Use   Smoking status: Never   Smokeless tobacco: Never  Substance Use Topics   Alcohol use: Not Currently    Comment: socially    Family History  Problem Relation Age of Onset   Diabetes Other        maternal aunts and uncles   Diabetes Father    Liver disease Father    Emphysema Father        smoked   Diabetes Sister    Diabetes Brother    Sarcoidosis Brother    Emphysema Mother        smoked   Asthma Mother    Asthma Brother    Colon cancer Neg Hx    Esophageal cancer Neg Hx    Stomach cancer Neg Hx    Rectal cancer Neg Hx       Review of Systems  Musculoskeletal:  Positive for arthralgias.       Left knee  All other systems reviewed and are negative.   Objective:  Physical Exam Constitutional:      Appearance: Normal appearance.  HENT:     Head: Normocephalic and atraumatic.     Nose: Nose normal.     Mouth/Throat:     Pharynx: Oropharynx is clear.  Eyes:     Extraocular Movements: Extraocular movements intact.  Cardiovascular:     Rate and Rhythm: Normal rate.  Pulmonary:     Effort: Pulmonary effort is normal.  Abdominal:     Palpations: Abdomen is soft.  Musculoskeletal:     Cervical back: Normal range of motion.     Comments: Left knee has trace effusion.  Her scar looks benign.  Motion is really only about 20-70 today.  Calf is soft and nontender.  The knee does  not feel warm nor is it red.    Skin:    General: Skin is warm and dry.  Neurological:     General: No focal deficit present.     Mental Status: She is alert and oriented to person, place, and time. Mental status is at baseline.  Psychiatric:        Mood and Affect: Mood normal.        Behavior: Behavior normal.        Thought Content: Thought content normal.        Judgment: Judgment normal.    Vital signs in last 24 hours:    Labs:  Estimated body mass index is 28.67 kg/m as calculated from the following:   Height as of 09/25/21: 5' (1.524 m).   Weight as of 09/21/21: 66.6 kg.  Imaging Review Plain radiographs demonstrate  a well-seated well aligned TKR prosthesis with no acute bony abnormalities . The overall alignment is neutral.The bone quality appears to be good for age and reported activity level.  Assessment/Plan:  End stage primary arthritis, left knee(s) with failed previous arthroplasty.   The patient history, physical examination, clinical judgment of the provider and imaging studies are consistent with end stage degenerative joint disease of the left knee(s), previous total knee arthroplasty. Revision total knee arthroplasty is deemed medically necessary. The treatment options including medical management, injection therapy, arthroscopy and revision arthroplasty were  discussed at length. The risks and benefits of revision total knee arthroplasty were presented and reviewed. The risks due to aseptic loosening, infection, stiffness, patella tracking problems, thromboembolic complications and other imponderables were discussed. The patient acknowledged the explanation, agreed to proceed with the plan and consent was signed. Patient is being admitted for inpatient treatment for surgery, pain control, PT, OT, prophylactic antibiotics, VTE prophylaxis, progressive ambulation and ADL's and discharge planning.The patient is planning to be discharged home with home health  services

## 2021-10-03 ENCOUNTER — Encounter (HOSPITAL_COMMUNITY)
Admission: RE | Disposition: A | Discharge status: Home / Self Care | Payer: Self-pay | Source: Ambulatory Visit | Attending: Orthopaedic Surgery

## 2021-10-03 ENCOUNTER — Inpatient Hospital Stay (HOSPITAL_COMMUNITY): Payer: Medicare Other | Admitting: Anesthesiology

## 2021-10-03 ENCOUNTER — Inpatient Hospital Stay (HOSPITAL_COMMUNITY): Payer: Medicare Other | Admitting: Physician Assistant

## 2021-10-03 ENCOUNTER — Encounter (HOSPITAL_COMMUNITY): Payer: Self-pay | Admitting: Orthopaedic Surgery

## 2021-10-03 ENCOUNTER — Inpatient Hospital Stay (HOSPITAL_COMMUNITY)
Admission: RE | Admit: 2021-10-03 | Discharge: 2021-10-06 | DRG: 486 | Disposition: A | Discharge status: Home / Self Care | Payer: Medicare Other | Source: Ambulatory Visit | Attending: Orthopaedic Surgery | Admitting: Orthopaedic Surgery

## 2021-10-03 ENCOUNTER — Inpatient Hospital Stay: Payer: Self-pay

## 2021-10-03 ENCOUNTER — Other Ambulatory Visit: Payer: Self-pay

## 2021-10-03 DIAGNOSIS — Z8673 Personal history of transient ischemic attack (TIA), and cerebral infarction without residual deficits: Secondary | ICD-10-CM | POA: Diagnosis not present

## 2021-10-03 DIAGNOSIS — Z7984 Long term (current) use of oral hypoglycemic drugs: Secondary | ICD-10-CM | POA: Diagnosis not present

## 2021-10-03 DIAGNOSIS — T8454XS 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, sequela: Secondary | ICD-10-CM | POA: Diagnosis present

## 2021-10-03 DIAGNOSIS — K219 Gastro-esophageal reflux disease without esophagitis: Secondary | ICD-10-CM | POA: Diagnosis present

## 2021-10-03 DIAGNOSIS — J45909 Unspecified asthma, uncomplicated: Secondary | ICD-10-CM | POA: Diagnosis present

## 2021-10-03 DIAGNOSIS — T8454XA 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, initial encounter: Principal | ICD-10-CM | POA: Diagnosis present

## 2021-10-03 DIAGNOSIS — K717 Toxic liver disease with fibrosis and cirrhosis of liver: Secondary | ICD-10-CM | POA: Diagnosis not present

## 2021-10-03 DIAGNOSIS — Z7985 Long-term (current) use of injectable non-insulin antidiabetic drugs: Secondary | ICD-10-CM | POA: Diagnosis not present

## 2021-10-03 DIAGNOSIS — Z7982 Long term (current) use of aspirin: Secondary | ICD-10-CM | POA: Diagnosis not present

## 2021-10-03 DIAGNOSIS — Z87448 Personal history of other diseases of urinary system: Secondary | ICD-10-CM

## 2021-10-03 DIAGNOSIS — B957 Other staphylococcus as the cause of diseases classified elsewhere: Secondary | ICD-10-CM | POA: Diagnosis present

## 2021-10-03 DIAGNOSIS — E1143 Type 2 diabetes mellitus with diabetic autonomic (poly)neuropathy: Secondary | ICD-10-CM | POA: Diagnosis present

## 2021-10-03 DIAGNOSIS — Y831 Surgical operation with implant of artificial internal device as the cause of abnormal reaction of the patient, or of later complication, without mention of misadventure at the time of the procedure: Secondary | ICD-10-CM | POA: Diagnosis present

## 2021-10-03 DIAGNOSIS — K3184 Gastroparesis: Secondary | ICD-10-CM | POA: Diagnosis present

## 2021-10-03 DIAGNOSIS — Z96651 Presence of right artificial knee joint: Secondary | ICD-10-CM | POA: Diagnosis present

## 2021-10-03 DIAGNOSIS — M810 Age-related osteoporosis without current pathological fracture: Secondary | ICD-10-CM | POA: Diagnosis present

## 2021-10-03 DIAGNOSIS — L4050 Arthropathic psoriasis, unspecified: Secondary | ICD-10-CM | POA: Diagnosis present

## 2021-10-03 DIAGNOSIS — E785 Hyperlipidemia, unspecified: Secondary | ICD-10-CM | POA: Diagnosis present

## 2021-10-03 DIAGNOSIS — D62 Acute posthemorrhagic anemia: Secondary | ICD-10-CM | POA: Diagnosis not present

## 2021-10-03 DIAGNOSIS — E1065 Type 1 diabetes mellitus with hyperglycemia: Secondary | ICD-10-CM

## 2021-10-03 DIAGNOSIS — Z7951 Long term (current) use of inhaled steroids: Secondary | ICD-10-CM

## 2021-10-03 DIAGNOSIS — I10 Essential (primary) hypertension: Secondary | ICD-10-CM | POA: Diagnosis present

## 2021-10-03 DIAGNOSIS — Z79899 Other long term (current) drug therapy: Secondary | ICD-10-CM | POA: Diagnosis not present

## 2021-10-03 DIAGNOSIS — Z825 Family history of asthma and other chronic lower respiratory diseases: Secondary | ICD-10-CM | POA: Diagnosis not present

## 2021-10-03 DIAGNOSIS — B958 Unspecified staphylococcus as the cause of diseases classified elsewhere: Secondary | ICD-10-CM

## 2021-10-03 DIAGNOSIS — Z888 Allergy status to other drugs, medicaments and biological substances status: Secondary | ICD-10-CM | POA: Diagnosis not present

## 2021-10-03 DIAGNOSIS — Z833 Family history of diabetes mellitus: Secondary | ICD-10-CM

## 2021-10-03 DIAGNOSIS — Z01818 Encounter for other preprocedural examination: Secondary | ICD-10-CM

## 2021-10-03 DIAGNOSIS — E1365 Other specified diabetes mellitus with hyperglycemia: Secondary | ICD-10-CM | POA: Diagnosis not present

## 2021-10-03 HISTORY — PX: TOTAL KNEE REVISION: SHX996

## 2021-10-03 LAB — GLUCOSE, CAPILLARY
Glucose-Capillary: 128 mg/dL — ABNORMAL HIGH (ref 70–99)
Glucose-Capillary: 169 mg/dL — ABNORMAL HIGH (ref 70–99)
Glucose-Capillary: 222 mg/dL — ABNORMAL HIGH (ref 70–99)
Glucose-Capillary: 185 mg/dL — ABNORMAL HIGH (ref 70–99)

## 2021-10-03 LAB — PROTIME-INR
INR: 1.1 (ref 0.8–1.2)
Prothrombin Time: 14.3 seconds (ref 11.4–15.2)

## 2021-10-03 LAB — ABO/RH: ABO/RH(D): A POS

## 2021-10-03 LAB — APTT: aPTT: 48 seconds — ABNORMAL HIGH (ref 24–36)

## 2021-10-03 SURGERY — TOTAL KNEE REVISION
Anesthesia: Monitor Anesthesia Care | Site: Knee | Laterality: Left

## 2021-10-03 MED ORDER — PRAVASTATIN SODIUM 20 MG PO TABS
40.0000 mg | ORAL_TABLET | Freq: Every day | ORAL | Status: DC
  Administered 2021-10-03 – 2021-10-06 (×4): 40 mg via ORAL
  Filled 2021-10-03 (×4): qty 2

## 2021-10-03 MED ORDER — INSULIN ASPART 100 UNIT/ML IJ SOLN
0.0000 [IU] | Freq: Three times a day (TID) | INTRAMUSCULAR | Status: DC
Start: 2021-10-03 — End: 2021-10-06
  Administered 2021-10-03: 19:00:00 5 [IU] via SUBCUTANEOUS
  Administered 2021-10-04 – 2021-10-06 (×4): 2 [IU] via SUBCUTANEOUS

## 2021-10-03 MED ORDER — BUPIVACAINE IN DEXTROSE 0.75-8.25 % IT SOLN
INTRATHECAL | Status: DC | PRN
  Administered 2021-10-03: 1.8 mL via INTRATHECAL

## 2021-10-03 MED ORDER — BUPIVACAINE LIPOSOME 1.3 % IJ SUSP
INTRAMUSCULAR | Status: DC | PRN
  Administered 2021-10-03: 20 mL

## 2021-10-03 MED ORDER — HYDROCODONE-ACETAMINOPHEN 7.5-325 MG PO TABS
1.0000 | ORAL_TABLET | ORAL | Status: DC | PRN
  Administered 2021-10-03: 17:00:00 1 via ORAL
  Administered 2021-10-03 – 2021-10-05 (×10): 2 via ORAL
  Administered 2021-10-06: 05:00:00 1 via ORAL
  Administered 2021-10-06: 10:00:00 2 via ORAL
  Filled 2021-10-03 (×6): qty 2
  Filled 2021-10-03: qty 1
  Filled 2021-10-03 (×2): qty 2
  Filled 2021-10-03: qty 1
  Filled 2021-10-03 (×3): qty 2

## 2021-10-03 MED ORDER — BUPIVACAINE-EPINEPHRINE (PF) 0.25% -1:200000 IJ SOLN
INTRAMUSCULAR | Status: AC
  Filled 2021-10-03: qty 30

## 2021-10-03 MED ORDER — LACTATED RINGERS IV SOLN: INTRAVENOUS | Status: DC

## 2021-10-03 MED ORDER — SODIUM CHLORIDE 0.9% FLUSH
10.0000 mL | Freq: Two times a day (BID) | INTRAVENOUS | Status: DC
Start: 2021-10-03 — End: 2021-10-06
  Administered 2021-10-04 – 2021-10-06 (×2): 10 mL

## 2021-10-03 MED ORDER — BUPIVACAINE LIPOSOME 1.3 % IJ SUSP: 20.0000 mL | Freq: Once | INTRAMUSCULAR | Status: DC

## 2021-10-03 MED ORDER — AMISULPRIDE (ANTIEMETIC) 5 MG/2ML IV SOLN: 10.0000 mg | Freq: Once | INTRAVENOUS | Status: DC | PRN

## 2021-10-03 MED ORDER — LIDOCAINE HCL (CARDIAC) PF 100 MG/5ML IV SOSY
PREFILLED_SYRINGE | INTRAVENOUS | Status: DC | PRN
  Administered 2021-10-03: 30 mg via INTRATRACHEAL

## 2021-10-03 MED ORDER — DEXAMETHASONE SODIUM PHOSPHATE 10 MG/ML IJ SOLN
INTRAMUSCULAR | Status: DC | PRN
  Administered 2021-10-03: 10 mg

## 2021-10-03 MED ORDER — PHENOL 1.4 % MT LIQD: 1.0000 | OROMUCOSAL | Status: DC | PRN

## 2021-10-03 MED ORDER — KETOROLAC TROMETHAMINE 15 MG/ML IJ SOLN
7.5000 mg | Freq: Four times a day (QID) | INTRAMUSCULAR | Status: AC
  Administered 2021-10-03 – 2021-10-04 (×4): 7.5 mg via INTRAVENOUS
  Filled 2021-10-03 (×4): qty 1

## 2021-10-03 MED ORDER — PROPOFOL 500 MG/50ML IV EMUL
INTRAVENOUS | Status: DC | PRN
  Administered 2021-10-03: 50 ug/kg/min via INTRAVENOUS

## 2021-10-03 MED ORDER — BUPIVACAINE LIPOSOME 1.3 % IJ SUSP
INTRAMUSCULAR | Status: AC
  Filled 2021-10-03: qty 20

## 2021-10-03 MED ORDER — DIPHENHYDRAMINE HCL 12.5 MG/5ML PO ELIX: 12.5000 mg | ORAL_SOLUTION | ORAL | Status: DC | PRN

## 2021-10-03 MED ORDER — PANTOPRAZOLE SODIUM 40 MG PO TBEC
80.0000 mg | DELAYED_RELEASE_TABLET | Freq: Every day | ORAL | Status: DC
  Administered 2021-10-04 – 2021-10-05 (×2): 80 mg via ORAL
  Filled 2021-10-03 (×2): qty 2

## 2021-10-03 MED ORDER — CHLORHEXIDINE GLUCONATE 0.12 % MT SOLN
15.0000 mL | Freq: Once | OROMUCOSAL | Status: AC
  Administered 2021-10-03: 08:00:00 15 mL via OROMUCOSAL

## 2021-10-03 MED ORDER — FLUOXETINE HCL 10 MG PO CAPS
10.0000 mg | ORAL_CAPSULE | Freq: Two times a day (BID) | ORAL | Status: DC
  Administered 2021-10-03 – 2021-10-06 (×6): 10 mg via ORAL
  Filled 2021-10-03 (×6): qty 1

## 2021-10-03 MED ORDER — SODIUM CHLORIDE 0.9 % IR SOLN
Status: DC | PRN
  Administered 2021-10-03: 3000 mL

## 2021-10-03 MED ORDER — SODIUM CHLORIDE 0.9% FLUSH: 10.0000 mL | INTRAVENOUS | Status: DC | PRN

## 2021-10-03 MED ORDER — MIDAZOLAM HCL 2 MG/2ML IJ SOLN
1.0000 mg | INTRAMUSCULAR | Status: AC
  Administered 2021-10-03: 09:00:00 2 mg via INTRAVENOUS
  Filled 2021-10-03: qty 2

## 2021-10-03 MED ORDER — SODIUM CHLORIDE (PF) 0.9 % IJ SOLN
INTRAMUSCULAR | Status: DC | PRN
  Administered 2021-10-03: 30 mL

## 2021-10-03 MED ORDER — PHENYLEPHRINE HCL-NACL 20-0.9 MG/250ML-% IV SOLN
INTRAVENOUS | Status: DC | PRN
  Administered 2021-10-03: 25 ug/min via INTRAVENOUS

## 2021-10-03 MED ORDER — HYDROCODONE-ACETAMINOPHEN 5-325 MG PO TABS
1.0000 | ORAL_TABLET | ORAL | Status: DC | PRN
  Administered 2021-10-03: 14:00:00 1 via ORAL
  Filled 2021-10-03: qty 1

## 2021-10-03 MED ORDER — METHOCARBAMOL 1000 MG/10ML IJ SOLN
500.0000 mg | Freq: Four times a day (QID) | INTRAVENOUS | Status: DC | PRN
  Filled 2021-10-03: qty 5

## 2021-10-03 MED ORDER — ONDANSETRON 4 MG PO TBDP: 4.0000 mg | ORAL_TABLET | Freq: Three times a day (TID) | ORAL | Status: DC | PRN

## 2021-10-03 MED ORDER — MORPHINE SULFATE (PF) 2 MG/ML IV SOLN
0.5000 mg | INTRAVENOUS | Status: DC | PRN
  Administered 2021-10-04 – 2021-10-05 (×4): 1 mg via INTRAVENOUS
  Filled 2021-10-03 (×4): qty 1

## 2021-10-03 MED ORDER — CEFAZOLIN SODIUM-DEXTROSE 2-4 GM/100ML-% IV SOLN
2.0000 g | INTRAVENOUS | Status: AC
  Administered 2021-10-03: 10:00:00 2 g via INTRAVENOUS
  Filled 2021-10-03: qty 100

## 2021-10-03 MED ORDER — ONDANSETRON HCL 4 MG/2ML IJ SOLN: 4.0000 mg | Freq: Once | INTRAMUSCULAR | Status: DC | PRN

## 2021-10-03 MED ORDER — SODIUM CHLORIDE 0.9 % IV SOLN
500.0000 mg | Freq: Every day | INTRAVENOUS | Status: DC
  Administered 2021-10-03 – 2021-10-05 (×3): 500 mg via INTRAVENOUS
  Filled 2021-10-03 (×5): qty 10

## 2021-10-03 MED ORDER — CHLORHEXIDINE GLUCONATE CLOTH 2 % EX PADS
6.0000 | MEDICATED_PAD | Freq: Every day | CUTANEOUS | Status: DC
  Administered 2021-10-04 – 2021-10-06 (×3): 6 via TOPICAL

## 2021-10-03 MED ORDER — TRANEXAMIC ACID-NACL 1000-0.7 MG/100ML-% IV SOLN
1000.0000 mg | INTRAVENOUS | Status: AC
  Administered 2021-10-03: 10:00:00 1000 mg via INTRAVENOUS
  Filled 2021-10-03: qty 100

## 2021-10-03 MED ORDER — POVIDONE-IODINE 10 % EX SWAB
2.0000 "application " | Freq: Once | CUTANEOUS | Status: AC
  Administered 2021-10-03: 2 via TOPICAL

## 2021-10-03 MED ORDER — ONDANSETRON HCL 4 MG/2ML IJ SOLN: 4.0000 mg | Freq: Four times a day (QID) | INTRAMUSCULAR | Status: DC | PRN

## 2021-10-03 MED ORDER — ALUM & MAG HYDROXIDE-SIMETH 200-200-20 MG/5ML PO SUSP: 30.0000 mL | ORAL | Status: DC | PRN

## 2021-10-03 MED ORDER — ONDANSETRON HCL 4 MG PO TABS: 4.0000 mg | ORAL_TABLET | Freq: Four times a day (QID) | ORAL | Status: DC | PRN

## 2021-10-03 MED ORDER — TRANEXAMIC ACID-NACL 1000-0.7 MG/100ML-% IV SOLN
1000.0000 mg | Freq: Once | INTRAVENOUS | Status: AC
  Administered 2021-10-03: 16:00:00 1000 mg via INTRAVENOUS
  Filled 2021-10-03: qty 100

## 2021-10-03 MED ORDER — PRIMIDONE 250 MG PO TABS
250.0000 mg | ORAL_TABLET | Freq: Two times a day (BID) | ORAL | Status: DC
  Administered 2021-10-03 – 2021-10-06 (×6): 250 mg via ORAL
  Filled 2021-10-03 (×6): qty 1

## 2021-10-03 MED ORDER — IRBESARTAN 150 MG PO TABS
300.0000 mg | ORAL_TABLET | Freq: Every day | ORAL | Status: DC
  Administered 2021-10-03 – 2021-10-06 (×4): 300 mg via ORAL
  Filled 2021-10-03 (×4): qty 2

## 2021-10-03 MED ORDER — METOCLOPRAMIDE HCL 5 MG/ML IJ SOLN: 5.0000 mg | Freq: Three times a day (TID) | INTRAMUSCULAR | Status: DC | PRN

## 2021-10-03 MED ORDER — METHOCARBAMOL 500 MG PO TABS
500.0000 mg | ORAL_TABLET | Freq: Four times a day (QID) | ORAL | Status: DC | PRN
  Administered 2021-10-03 – 2021-10-06 (×6): 500 mg via ORAL
  Filled 2021-10-03 (×6): qty 1

## 2021-10-03 MED ORDER — ORAL CARE MOUTH RINSE: 15.0000 mL | Freq: Once | OROMUCOSAL | Status: AC

## 2021-10-03 MED ORDER — ALBUTEROL SULFATE HFA 108 (90 BASE) MCG/ACT IN AERS: 2.0000 | INHALATION_SPRAY | RESPIRATORY_TRACT | Status: DC | PRN

## 2021-10-03 MED ORDER — FENTANYL CITRATE PF 50 MCG/ML IJ SOSY
50.0000 ug | PREFILLED_SYRINGE | INTRAMUSCULAR | Status: AC
  Administered 2021-10-03: 09:00:00 100 ug via INTRAVENOUS
  Filled 2021-10-03: qty 2

## 2021-10-03 MED ORDER — OXYCODONE HCL 5 MG PO TABS: 5.0000 mg | ORAL_TABLET | Freq: Once | ORAL | Status: DC | PRN

## 2021-10-03 MED ORDER — DOCUSATE SODIUM 100 MG PO CAPS
100.0000 mg | ORAL_CAPSULE | Freq: Two times a day (BID) | ORAL | Status: DC
  Administered 2021-10-03 – 2021-10-06 (×6): 100 mg via ORAL
  Filled 2021-10-03 (×6): qty 1

## 2021-10-03 MED ORDER — TRANEXAMIC ACID 1000 MG/10ML IV SOLN
INTRAVENOUS | Status: DC | PRN
  Administered 2021-10-03: 11:00:00 2000 mg via TOPICAL

## 2021-10-03 MED ORDER — BUPIVACAINE-EPINEPHRINE 0.25% -1:200000 IJ SOLN
INTRAMUSCULAR | Status: DC | PRN
  Administered 2021-10-03: 30 mL

## 2021-10-03 MED ORDER — PROPOFOL 1000 MG/100ML IV EMUL
INTRAVENOUS | Status: AC
  Filled 2021-10-03: qty 100

## 2021-10-03 MED ORDER — AMLODIPINE BESYLATE 5 MG PO TABS
5.0000 mg | ORAL_TABLET | Freq: Every day | ORAL | Status: DC
  Administered 2021-10-04 – 2021-10-06 (×3): 5 mg via ORAL
  Filled 2021-10-03 (×3): qty 1

## 2021-10-03 MED ORDER — ONDANSETRON HCL 4 MG/2ML IJ SOLN
INTRAMUSCULAR | Status: AC
  Filled 2021-10-03: qty 2

## 2021-10-03 MED ORDER — ACETAMINOPHEN 325 MG PO TABS: 325.0000 mg | ORAL_TABLET | Freq: Four times a day (QID) | ORAL | Status: DC | PRN

## 2021-10-03 MED ORDER — PROPOFOL 10 MG/ML IV BOLUS
INTRAVENOUS | Status: DC | PRN
  Administered 2021-10-03 (×2): 30 mg via INTRAVENOUS

## 2021-10-03 MED ORDER — MENTHOL 3 MG MT LOZG: 1.0000 | LOZENGE | OROMUCOSAL | Status: DC | PRN

## 2021-10-03 MED ORDER — 0.9 % SODIUM CHLORIDE (POUR BTL) OPTIME
TOPICAL | Status: DC | PRN
  Administered 2021-10-03: 11:00:00 1000 mL

## 2021-10-03 MED ORDER — OXYCODONE HCL 5 MG/5ML PO SOLN: 5.0000 mg | Freq: Once | ORAL | Status: DC | PRN

## 2021-10-03 MED ORDER — METOCLOPRAMIDE HCL 5 MG PO TABS: 5.0000 mg | ORAL_TABLET | Freq: Three times a day (TID) | ORAL | Status: DC | PRN

## 2021-10-03 MED ORDER — ROPIVACAINE HCL 5 MG/ML IJ SOLN
INTRAMUSCULAR | Status: DC | PRN
  Administered 2021-10-03: 30 mL via PERINEURAL

## 2021-10-03 MED ORDER — ACETAMINOPHEN 500 MG PO TABS
1000.0000 mg | ORAL_TABLET | Freq: Once | ORAL | Status: AC
  Administered 2021-10-03: 08:00:00 1000 mg via ORAL
  Filled 2021-10-03: qty 2

## 2021-10-03 MED ORDER — HYDROMORPHONE HCL 1 MG/ML IJ SOLN
0.2500 mg | INTRAMUSCULAR | Status: DC | PRN
Start: 2021-10-03 — End: 2021-10-03

## 2021-10-03 MED ORDER — DILTIAZEM HCL ER COATED BEADS 180 MG PO CP24
180.0000 mg | ORAL_CAPSULE | Freq: Every day | ORAL | Status: DC
  Administered 2021-10-03 – 2021-10-06 (×4): 180 mg via ORAL
  Filled 2021-10-03 (×4): qty 1

## 2021-10-03 MED ORDER — ACETAMINOPHEN 500 MG PO TABS
500.0000 mg | ORAL_TABLET | Freq: Four times a day (QID) | ORAL | Status: AC
  Administered 2021-10-03 (×2): 500 mg via ORAL
  Filled 2021-10-03 (×2): qty 1

## 2021-10-03 MED ORDER — BUSPIRONE HCL 5 MG PO TABS
5.0000 mg | ORAL_TABLET | Freq: Two times a day (BID) | ORAL | Status: DC
  Administered 2021-10-03 – 2021-10-06 (×6): 5 mg via ORAL
  Filled 2021-10-03 (×6): qty 1

## 2021-10-03 MED ORDER — ALBUTEROL SULFATE (2.5 MG/3ML) 0.083% IN NEBU: 2.5000 mg | INHALATION_SOLUTION | RESPIRATORY_TRACT | Status: DC | PRN

## 2021-10-03 MED ORDER — ASPIRIN 81 MG PO CHEW
81.0000 mg | CHEWABLE_TABLET | Freq: Two times a day (BID) | ORAL | Status: DC
  Administered 2021-10-04 – 2021-10-06 (×5): 81 mg via ORAL
  Filled 2021-10-03 (×5): qty 1

## 2021-10-03 MED ORDER — MEMANTINE HCL 10 MG PO TABS
10.0000 mg | ORAL_TABLET | Freq: Two times a day (BID) | ORAL | Status: DC
  Administered 2021-10-03 – 2021-10-06 (×6): 10 mg via ORAL
  Filled 2021-10-03 (×6): qty 1

## 2021-10-03 MED ORDER — TRANEXAMIC ACID 1000 MG/10ML IV SOLN
2000.0000 mg | INTRAVENOUS | Status: DC
  Filled 2021-10-03: qty 20

## 2021-10-03 MED ORDER — METFORMIN HCL ER 500 MG PO TB24
1000.0000 mg | ORAL_TABLET | Freq: Every day | ORAL | Status: DC
  Administered 2021-10-04 – 2021-10-06 (×3): 1000 mg via ORAL
  Filled 2021-10-03 (×3): qty 2

## 2021-10-03 MED ORDER — BISACODYL 5 MG PO TBEC: 5.0000 mg | DELAYED_RELEASE_TABLET | Freq: Every day | ORAL | Status: DC | PRN

## 2021-10-03 MED ORDER — ONDANSETRON HCL 4 MG/2ML IJ SOLN
INTRAMUSCULAR | Status: DC | PRN
  Administered 2021-10-03: 4 mg via INTRAVENOUS

## 2021-10-03 MED ORDER — SODIUM CHLORIDE (PF) 0.9 % IJ SOLN
INTRAMUSCULAR | Status: AC
  Filled 2021-10-03: qty 30

## 2021-10-03 MED ORDER — BUDESONIDE 0.25 MG/2ML IN SUSP
0.2500 mg | Freq: Two times a day (BID) | RESPIRATORY_TRACT | Status: DC
  Administered 2021-10-03 – 2021-10-06 (×6): 0.25 mg via RESPIRATORY_TRACT
  Filled 2021-10-03 (×6): qty 2

## 2021-10-03 MED ORDER — LIDOCAINE HCL (PF) 2 % IJ SOLN
INTRAMUSCULAR | Status: AC
  Filled 2021-10-03: qty 5

## 2021-10-03 SURGICAL SUPPLY — 55 items
BAG COUNTER SPONGE SURGICOUNT (BAG) IMPLANT
BAG SPEC THK2 15X12 ZIP CLS (MISCELLANEOUS) ×1
BAG SPNG CNTER NS LX DISP (BAG)
BAG SURGICOUNT SPONGE COUNTING (BAG)
BAG ZIPLOCK 12X15 (MISCELLANEOUS) ×3 IMPLANT
BLADE SAGITTAL 25.0X1.19X90 (BLADE) IMPLANT
BLADE SAGITTAL 25.0X1.19X90MM (BLADE)
BLADE SAW SGTL 11.0X1.19X90.0M (BLADE) IMPLANT
BLADE SURG SZ10 CARB STEEL (BLADE) ×8 IMPLANT
BNDG ELASTIC 4X5.8 VLCR STR LF (GAUZE/BANDAGES/DRESSINGS) ×3 IMPLANT
BNDG ELASTIC 6X5.8 VLCR STR LF (GAUZE/BANDAGES/DRESSINGS) ×3 IMPLANT
BNDG GAUZE ELAST 4 BULKY (GAUZE/BANDAGES/DRESSINGS) ×2 IMPLANT
BOOTIES KNEE HIGH SLOAN (MISCELLANEOUS) ×3 IMPLANT
CUFF TOURN SGL QUICK 34 (TOURNIQUET CUFF) ×3
CUFF TRNQT CYL 34X4.125X (TOURNIQUET CUFF) ×1 IMPLANT
DECANTER SPIKE VIAL GLASS SM (MISCELLANEOUS) IMPLANT
DRAPE INCISE IOBAN 66X45 STRL (DRAPES) ×9 IMPLANT
DRAPE ORTHO SPLIT 77X108 STRL (DRAPES)
DRAPE SURG ORHT 6 SPLT 77X108 (DRAPES) IMPLANT
DRAPE U-SHAPE 47X51 STRL (DRAPES) ×3 IMPLANT
DRESSING AQUACEL AG SP 3.5X10 (GAUZE/BANDAGES/DRESSINGS) IMPLANT
DRSG AQUACEL AG ADV 3.5X10 (GAUZE/BANDAGES/DRESSINGS) ×3 IMPLANT
DRSG AQUACEL AG SP 3.5X10 (GAUZE/BANDAGES/DRESSINGS) ×3
ELECT REM PT RETURN 15FT ADLT (MISCELLANEOUS) ×3 IMPLANT
EVACUATOR 1/8 PVC DRAIN (DRAIN) ×2 IMPLANT
GAUZE SPONGE 4X4 12PLY STRL (GAUZE/BANDAGES/DRESSINGS) ×2 IMPLANT
GAUZE XEROFORM 1X8 LF (GAUZE/BANDAGES/DRESSINGS) ×2 IMPLANT
GLOVE SRG 8 PF TXTR STRL LF DI (GLOVE) ×2 IMPLANT
GLOVE SURG ENC MOIS LTX SZ8 (GLOVE) ×6 IMPLANT
GLOVE SURG UNDER POLY LF SZ8 (GLOVE) ×6
GOWN STRL REUS W/TWL XL LVL3 (GOWN DISPOSABLE) ×6 IMPLANT
HANDPIECE INTERPULSE COAX TIP (DISPOSABLE) ×3
HOLDER FOLEY CATH W/STRAP (MISCELLANEOUS) IMPLANT
INSERT TIB LCS RP STD 10 (Knees) ×2 IMPLANT
KIT TURNOVER KIT A (KITS) IMPLANT
NDL SAFETY ECLIPSE 18X1.5 (NEEDLE) IMPLANT
NEEDLE HYPO 18GX1.5 SHARP (NEEDLE)
NS IRRIG 1000ML POUR BTL (IV SOLUTION) ×3 IMPLANT
PACK TOTAL KNEE CUSTOM (KITS) IMPLANT
PROTECTOR NERVE ULNAR (MISCELLANEOUS) ×3 IMPLANT
SET HNDPC FAN SPRY TIP SCT (DISPOSABLE) ×1 IMPLANT
SPONGE DRAIN TRACH 4X4 STRL 2S (GAUZE/BANDAGES/DRESSINGS) ×2 IMPLANT
SPONGE T-LAP 18X18 ~~LOC~~+RFID (SPONGE) IMPLANT
STAPLER VISISTAT 35W (STAPLE) IMPLANT
SUT ETHIBOND NAB CT1 #1 30IN (SUTURE) ×6 IMPLANT
SUT ETHILON 3 0 PS 1 (SUTURE) ×2 IMPLANT
SUT VIC AB 0 CT1 36 (SUTURE) ×3 IMPLANT
SUT VIC AB 2-0 CT1 27 (SUTURE) ×3
SUT VIC AB 2-0 CT1 TAPERPNT 27 (SUTURE) ×1 IMPLANT
SUT VICRYL AB 3-0 FS1 BRD 27IN (SUTURE) ×3 IMPLANT
SUT VLOC 180 0 24IN GS25 (SUTURE) ×3 IMPLANT
SYR 3ML LL SCALE MARK (SYRINGE) IMPLANT
TRAY FOLEY MTR SLVR 16FR STAT (SET/KITS/TRAYS/PACK) ×3 IMPLANT
WATER STERILE IRR 1000ML POUR (IV SOLUTION) ×3 IMPLANT
WRAP KNEE MAXI GEL POST OP (GAUZE/BANDAGES/DRESSINGS) ×2 IMPLANT

## 2021-10-03 NOTE — Progress Notes (Signed)
Assisted Dr. Criss Rosales with left, ultrasound guided, adductor canal block. Side rails up, monitors on throughout procedure. See vital signs in flow sheet. Tolerated Procedure well.

## 2021-10-03 NOTE — Plan of Care (Signed)
°  Problem: Education: Goal: Knowledge of General Education information will improve Description: Including pain rating scale, medication(s)/side effects and non-pharmacologic comfort measures Outcome: Progressing   Problem: Activity: Goal: Risk for activity intolerance will decrease Outcome: Progressing   Problem: Nutrition: Goal: Adequate nutrition will be maintained Outcome: Progressing   Problem: Elimination: Goal: Will not experience complications related to bowel motility Outcome: Progressing   Problem: Safety: Goal: Ability to remain free from injury will improve Outcome: Progressing   Problem: Education: Goal: Knowledge of the prescribed therapeutic regimen will improve Outcome: Progressing   Problem: Activity: Goal: Ability to avoid complications of mobility impairment will improve Outcome: Progressing   Problem: Clinical Measurements: Goal: Postoperative complications will be avoided or minimized Outcome: Progressing   Problem: Pain Management: Goal: Pain level will decrease with appropriate interventions Outcome: Progressing

## 2021-10-03 NOTE — Progress Notes (Signed)
Peripherally Inserted Central Catheter Placement  The IV Nurse has discussed with the patient and/or persons authorized to consent for the patient, the purpose of this procedure and the potential benefits and risks involved with this procedure.  The benefits include less needle sticks, lab draws from the catheter, and the patient may be discharged home with the catheter. Risks include, but not limited to, infection, bleeding, blood clot (thrombus formation), and puncture of an artery; nerve damage and irregular heartbeat and possibility to perform a PICC exchange if needed/ordered by physician.  Alternatives to this procedure were also discussed.  Bard Power PICC patient education guide, fact sheet on infection prevention and patient information card has been provided to patient /or left at bedside.    PICC Placement Documentation  PICC Single Lumen 10/03/21 Right Brachial 35 cm 0 cm (Active)  Indication for Insertion or Continuance of Line Home intravenous therapies (PICC only) 10/03/21 1814  Exposed Catheter (cm) 0 cm 10/03/21 1814  Site Assessment Clean;Dry;Intact 10/03/21 1814  Line Status Flushed;Saline locked;Blood return noted 10/03/21 1814  Dressing Type Transparent;Securing device 10/03/21 1814  Dressing Status Clean;Dry;Intact 10/03/21 1814  Antimicrobial disc in place? Yes 10/03/21 1814  Safety Lock Not Applicable 83/15/17 6160  Dressing Intervention Other (Comment) 10/03/21 1814  Dressing Change Due 10/10/21 10/03/21 1814       Enos Fling 10/03/2021, 6:16 PM

## 2021-10-03 NOTE — Interval H&P Note (Signed)
History and Physical Interval Note:  10/03/2021 9:03 AM  Carrie [name]  has presented today for surgery, with the diagnosis of LEFT KNEE REPLACEMENT INFECTION.  The various methods of treatment have been discussed with the patient and family. After consideration of risks, benefits and other options for treatment, the patient has consented to  Procedure(s): LEFT TOTAL KNEE REVISION POLY SWAP WITH IRRIGATION AND DEBRIDEMENT (Left) as a surgical intervention.  The patient's history has been reviewed, patient examined, no change in status, stable for surgery.  I have reviewed the patient's chart and labs.  Questions were answered to the patient's satisfaction.     [name]

## 2021-10-03 NOTE — Consult Note (Signed)
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 HTN Bilateral TKA Anxiety Hospitalization with klebsiella PNA pyelenephritis  Plan:  Began daptomycin dosed by pharmacy Check baseline CPK, ESR, CRP Follow-up intraoperative cultures She should AVOID any and all potent "biologics" to treat her psoriatic arthritis   Principal Problem:   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, initial encounter ([place])   Scheduled Meds:  acetaminophen  500 mg Oral Q6H   [START ON 10/04/2021] amLODipine  5 mg Oral Daily   [START ON 10/04/2021] aspirin  81 mg Oral BID   budesonide  0.25 mg Nebulization BID   busPIRone  5 mg Oral BID   diltiazem  180 mg Oral Daily   docusate sodium  100 mg Oral BID   FLUoxetine  10 mg Oral BID   insulin aspart  0-15 Units Subcutaneous TID WC   irbesartan  300 mg Oral Daily   ketorolac  7.5 mg Intravenous Q6H   memantine  10 mg Oral BID   [START ON 10/04/2021] metFORMIN  1,000 mg Oral Q breakfast   [START ON 10/04/2021] pantoprazole  80 mg Oral Q1200   pravastatin  40 mg Oral Daily   primidone  250 mg Oral BID   Continuous Infusions:  DAPTOmycin (CUBICIN)  IV 500 mg (10/03/21 1637)   lactated ringers 50 mL/hr at 10/03/21 1450   methocarbamol (ROBAXIN) IV     PRN Meds:.[START ON 10/04/2021] acetaminophen, albuterol, alum & mag hydroxide-simeth, bisacodyl, diphenhydrAMINE, HYDROcodone-acetaminophen, HYDROcodone-acetaminophen, menthol-cetylpyridinium **OR** phenol, methocarbamol **OR** methocarbamol (ROBAXIN) IV, metoCLOPramide **OR** metoCLOPramide (REGLAN) injection, morphine injection, ondansetron **OR** ondansetron (ZOFRAN) IV  HPI: Carrie [name] is a 65 y.o. 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  Initial arthroplasty was complicated by loosening and required repeat surgery 16 years ago.  In the interval she began having worsening pain apparently over the last year according to noters from Loni Dolly, NP, although the patient herself relays worsening pain going back to this summer.  She relates the worsening of her knee pain to having been admitted the hospital with pyelonephritis in June.  Dates she was admitted with a fairly sensitive Klebsiella pneumonia urinary tract infection and pyelonephritis.  However her blood cultures at time of admission were negative.  She has had several serial aspirations of her knee joint (as it is my understanding these have been off antibiotics) with abnormally high white blood cell count for prosthetic joint.  All of the cultures however been unrevealing.  There was apparently a synovial assay that came back positive for a staphylococcal species but it does not elucidate which staphylococcal species was identified.  Her pain has deteriorated abruptly in the last few weeks with it going up to 10 out of 10 in severity accompanied by fevers and nausea and malaise.  She was having inability to bear weight.  She was brought to the operating room on December 20 and underwent irrigation and debridement with left total knee revision and exchange of polyethylene liner.  Cultures are still incubating at this  point in time, but gram-positive cocci were indeed seen on Gram stain.  Well it certainly would be a reasonable thought that she could have seeded her knee from a urinary tract infection she was not with a blood known bloodstream infection with her Klebsiella pneumonia and the  synovial acid I never identified gram-negative organisms and now gram stain is showing gram-positive organisms.  I think we clearly need to narrowly target gram-positive organisms and will start with daptomycin with the presumption we might be dealing with a methicillin-resistant Staph epidermidis.  I am hoping that something will grow on the culture so we will have data on antimicrobial susceptibilities.  Otherwise after completing 6 weeks of IV therapy will have to make an educated guess as to an effective oral therapy to get her through 6 months or longer therapy postoperatively.  Her inflammatory markers will be difficult to interpret in the context of her psoriasis.  She should not be on any potent Biologics that might cause deterioration of her infection.  I would avoid immunosuppression in general and only use it if necessary and use reversible immunosuppression and not as mentioned powerful biologic agents that antagonized tumor necrosis factor or other cytokine pathways.   I spent 84 minutes with the patient including than 50% of the time in face to face counseling of the patient regarding the nature of prosthetic joint infections pathogens that can be involved including likely pathogens involved in her specific case the joint surgical and medical management of them, personally reviewing  along with review of medical records in preparation for the visit and during the visit and in coordination of her care.      Review of Systems: Review of Systems  Constitutional:  Positive for fever. Negative for chills, malaise/fatigue and weight loss.  HENT:  Negative for congestion and sore throat.   Eyes:  Negative for blurred vision and photophobia.  Respiratory:  Negative for cough, shortness of breath and wheezing.   Cardiovascular:  Positive for leg swelling. Negative for chest pain and palpitations.  Gastrointestinal:  Negative for abdominal pain, blood in stool, constipation, diarrhea,  heartburn, melena, nausea and vomiting.  Genitourinary:  Negative for dysuria, flank pain and hematuria.  Musculoskeletal:  Positive for joint pain and myalgias. Negative for back pain and falls.  Skin:  Negative for itching and rash.  Neurological:  Negative for dizziness, focal weakness, loss of consciousness, weakness and headaches.  Endo/Heme/Allergies:  Does not bruise/bleed easily.  Psychiatric/Behavioral:  Negative for depression and suicidal ideas. The patient does not have insomnia.    Past Medical History:  Diagnosis Date   Anxiety    Arthritis    psoriatic arithritis, DDD    Asthma    Depression    Diabetes mellitus    Gastritis    Gastroparesis    GERD (gastroesophageal reflux disease)    Hernia    Hyperlipemia    Hyperlipemia    Hypertension    Liver cirrhosis ([place])    Neuropathy    Bil feet re: to Diabetes   Obesity    Osteoporosis    Plantar fasciitis    Stricture and stenosis of esophagus    TIA (transient ischemic attack)    8-10 yrs ago, no problems since   Tuberculosis    tested positive 2011, no symptoms, was on medicine for 6 months    Social History   Tobacco Use   Smoking status: Never   Smokeless tobacco: Never  Vaping Use   Vaping Use:  Never used  Substance Use Topics   Alcohol use: Not Currently    Comment: socially   Drug use: No    Family History  Problem Relation Age of Onset   Diabetes Other        maternal aunts and uncles   Diabetes Father    Liver disease Father    Emphysema Father        smoked   Diabetes Sister    Diabetes Brother    Sarcoidosis Brother    Emphysema Mother        smoked   Asthma Mother    Asthma Brother    Colon cancer Neg Hx    Esophageal cancer Neg Hx    Stomach cancer Neg Hx    Rectal cancer Neg Hx    Allergies  Allergen Reactions   Lisinopril Cough    OBJECTIVE: Blood pressure 135/71, pulse 87, temperature 98 F (36.7 C), temperature source Oral, resp. rate 18, height 5' (1.524 m),  weight 66.6 kg, SpO2 97 %.  Physical Exam Constitutional:      General: She is not in acute distress.    Appearance: Normal appearance. She is well-developed. She is not ill-appearing or diaphoretic.  HENT:     Head: Normocephalic and atraumatic.     Right Ear: Hearing and external ear normal.     Left Ear: Hearing and external ear normal.     Nose: No nasal deformity or rhinorrhea.  Eyes:     General: No scleral icterus.    Conjunctiva/sclera: Conjunctivae normal.     Right eye: Right conjunctiva is not injected.     Left eye: Left conjunctiva is not injected.     Pupils: Pupils are equal, round, and reactive to light.  Neck:     Vascular: No JVD.  Cardiovascular:     Rate and Rhythm: Normal rate and regular rhythm.     Heart sounds: S1 normal and S2 normal.  Pulmonary:     Effort: Pulmonary effort is normal. No respiratory distress.     Breath sounds: No wheezing.  Abdominal:     General: Bowel sounds are normal. There is no distension.     Palpations: Abdomen is soft.     Tenderness: There is no abdominal tenderness.  Musculoskeletal:     Right shoulder: Normal.     Left shoulder: Normal.     Cervical back: Normal range of motion and neck supple.     Right hip: Normal.     Left hip: Normal.     Right knee: Normal.     Left knee: Normal.  Lymphadenopathy:     Head:     Right side of head: No submandibular, preauricular or posterior auricular adenopathy.     Left side of head: No submandibular, preauricular or posterior auricular adenopathy.     Cervical: No cervical adenopathy.     Right cervical: No superficial or deep cervical adenopathy.    Left cervical: No superficial or deep cervical adenopathy.  Skin:    General: Skin is warm and dry.     Coloration: Skin is not pale.     Findings: No abrasion, bruising, ecchymosis, erythema, lesion or rash.     Nails: There is no clubbing.  Neurological:     General: No focal deficit present.     Mental Status: She is  alert and oriented to person, place, and time.     Sensory: No sensory deficit.     Coordination: Coordination normal.  Gait: Gait normal.  Psychiatric:        Attention and Perception: She is attentive.        Mood and Affect: Mood normal.        Speech: Speech normal.        Behavior: Behavior normal. Behavior is cooperative.        Thought Content: Thought content normal.        Judgment: Judgment normal.   Left knee bandaged Lab Results Lab Results  Component Value Date   WBC 7.0 09/25/2021   HGB 11.1 (L) 09/25/2021   HCT 34.9 (L) 09/25/2021   MCV 85.7 09/25/2021   PLT 257 09/25/2021    Lab Results  Component Value Date   CREATININE 0.91 09/25/2021   BUN 13 09/25/2021   NA 136 09/25/2021   K 3.5 09/25/2021   CL 102 09/25/2021   CO2 25 09/25/2021    Lab Results  Component Value Date   ALT 12 09/25/2021   AST 15 09/25/2021   ALKPHOS 66 09/25/2021   BILITOT 0.3 09/25/2021     Microbiology: Recent Results (from the past 240 hour(s))  Surgical pcr screen     Status: None   Collection Time: 09/25/21 11:47 AM   Specimen: Nasal Mucosa; Nasal Swab  Result Value Ref Range Status   MRSA, PCR NEGATIVE NEGATIVE Final   Staphylococcus aureus NEGATIVE NEGATIVE Final    Comment: (NOTE) The Xpert SA Assay (FDA approved for NASAL specimens in patients 40 years of age and older), is one component of a comprehensive surveillance program. It is not intended to diagnose infection nor to guide or monitor treatment. Performed at [facility], [place] [address] [place], [place] [address]   SARS Coronavirus 2 (TAT 6-24 hrs)     Status: None   Collection Time: 09/29/21 12:00 AM  Result Value Ref Range Status   SARS Coronavirus 2 RESULT: NEGATIVE  Final    Comment: RESULT: NEGATIVESARS-CoV-2 INTERPRETATION:A NEGATIVE  test result means that SARS-CoV-2 RNA was not present in the specimen above the limit of detection of this test. This does not preclude a possible  SARS-CoV-2 infection and should not be used as the  sole basis for patient management decisions. Negative results must be combined with clinical observations, patient history, and epidemiological information. Optimum specimen types and timing for peak viral levels during infections caused by SARS-CoV-2  have not been determined. Collection of multiple specimens or types of specimens may be necessary to detect virus. Improper specimen collection and handling, sequence variability under primers/probes, or organism present below the limit of detection may  lead to false negative results. Positive and negative predictive values of testing are highly dependent on prevalence. False negative test results are more likely when prevalence of disease is high.The expected result is NEGATIVE.Fact S heet for  Healthcare Providers: LocalChronicle.no Sheet for Patients: SalonLookup.es Reference Range - Negative   Aerobic/Anaerobic Culture w Gram Stain (surgical/deep wound)     Status: None (Preliminary result)   Collection Time: 10/03/21 10:38 AM   Specimen: PATH Cytology Misc. fluid; Body Fluid  Result Value Ref Range Status   Specimen Description   Final    SYNOVIAL LT KNEE Performed at [facility], [place] [address] [place], [place] [address]    Special Requests   Final    NONE Performed at [facility], [place] [address] [place], [place] [address]    Gram Stain   Final    FEW WBC  PRESENT, PREDOMINANTLY MONONUCLEAR RARE GRAM POSITIVE COCCI Performed at [facility], [place] [address] [place], [place] [address]    Culture PENDING  Incomplete   Report Status PENDING  Incomplete    Alcide Evener, MD [facility] [place] [facility] [phone] pager  10/03/2021, 6:16 PM

## 2021-10-03 NOTE — Anesthesia Postprocedure Evaluation (Signed)
Anesthesia Post Note  Patient: Carrie [name]  Procedure(s) Performed: LEFT TOTAL KNEE REVISION POLY SWAP WITH IRRIGATION AND DEBRIDEMENT (Left: Knee)     Patient location during evaluation: PACU Anesthesia Type: Regional, MAC and Spinal Level of consciousness: awake and alert and oriented Pain management: pain level controlled Vital Signs Assessment: post-procedure vital signs reviewed and stable Respiratory status: spontaneous breathing, nonlabored ventilation and respiratory function stable Cardiovascular status: blood pressure returned to baseline and stable Postop Assessment: no headache, no backache, spinal receding and no apparent nausea or vomiting Anesthetic complications: no   No notable events documented.  Last Vitals:  Vitals:   10/03/21 1230 10/03/21 1245  BP: 138/72 134/73  Pulse: 84 79  Resp: 17 16  Temp:    SpO2: 94% 93%    Last Pain:  Vitals:   10/03/21 1230  TempSrc:   PainSc: 0-No pain                 Pervis Hocking

## 2021-10-03 NOTE — Brief Op Note (Signed)
Carrie [name] [id number] 10/03/2021   PRE-OP DIAGNOSIS: infected left TKR  POST-OP DIAGNOSIS: same  PROCEDURE: left knee poly swap revision and I&D  ANESTHESIA: spinal and [place]   Dictation #:  [id number]

## 2021-10-03 NOTE — Evaluation (Signed)
Physical Therapy Evaluation Patient Details Name: Carrie [name] MRN: [id number] DOB: April 02, 1956 Today's Date: 10/03/2021  History of Present Illness  Patient is 65 y.o. female s/p Lt TKR (poly exchange and I&D) on 10/03/21 with PMH significant for anxiety, asthma, depression, DM, GERD, HLD, HTN, neuropathy, osteoporosis, TIA, Bil TKA.    Clinical Impression  KARILYNN CARRANZA is a 65 y.o. female POD 0 s/p Lt TKR and I&D. Patient reports independence with mobility at baseline. Patient is now limited by functional impairments (see PT problem list below) and requires min assist for transfers and gait with RW. Patient was able to ambulate ~12 feet with RW and min assist and was limited by pain. Patient instructed in exercise to facilitate circulation to manage edema and reduce risk of DVT. Patient will benefit from continued skilled PT interventions to address impairments and progress towards PLOF. Acute PT will follow to progress mobility and stair training in preparation for safe discharge home.        Recommendations for follow up therapy are one component of a multi-disciplinary discharge planning process, led by the attending physician.  Recommendations may be updated based on patient status, additional functional criteria and insurance authorization.  Follow Up Recommendations Follow physician's recommendations for discharge plan and follow up therapies    Assistance Recommended at Discharge Intermittent Supervision/Assistance  Functional Status Assessment Patient has had a recent decline in their functional status and demonstrates the ability to make significant improvements in function in a reasonable and predictable amount of time.  Equipment Recommendations  None recommended by PT    Recommendations for Other Services       Precautions / Restrictions Precautions Precautions: Fall Restrictions Weight Bearing Restrictions: No Other Position/Activity Restrictions: WBAT       Mobility  Bed Mobility Overal bed mobility: Needs Assistance Bed Mobility: Supine to Sit     Supine to sit: Min assist;HOB elevated     General bed mobility comments: cues to use bed rail, assist for Lt LE to EOB, pt taking extra time.    Transfers Overall transfer level: Needs assistance Equipment used: Rolling walker (2 wheels) Transfers: Sit to/from Stand Sit to Stand: Min assist           General transfer comment: cues for hand placement, light assist to rise from EOB, pt hesitant to WB on Lt LE initially.    Ambulation/Gait Ambulation/Gait assistance: Min assist Gait Distance (Feet): 12 Feet Assistive device: Rolling walker (2 wheels) Gait Pattern/deviations: Step-to pattern;Decreased stride length;Decreased weight shift to left Gait velocity: decr     General Gait Details: cues for step to pattern and proximity to RW, no overt LOB noted or buckling at Lt knee. distance limited by pain.  Stairs            Wheelchair Mobility    Modified Rankin (Stroke Patients Only)       Balance Overall balance assessment: Needs assistance Sitting-balance support: Feet supported Sitting balance-Leahy Scale: Good     Standing balance support: During functional activity;Reliant on assistive device for balance;Bilateral upper extremity supported Standing balance-Leahy Scale: Poor                               Pertinent Vitals/Pain Pain Assessment: 0-10 Pain Score: 8  Pain Location: Lt knee Pain Descriptors / Indicators: Aching;Discomfort Pain Intervention(s): Limited activity within patient's tolerance;Monitored during session;Repositioned;Patient requesting pain meds-RN notified;RN gave pain meds during session;Ice applied  Home Living Family/patient expects to be discharged to:: Private residence Living Arrangements: Spouse/significant other Available Help at Discharge: Family;Available PRN/intermittently Type of Home: House Home Access:  Stairs to enter Entrance Stairs-Rails: Can reach both Entrance Stairs-Number of Steps: 3   Home Layout: One level Home Equipment: Conservation officer, nature (2 wheels);Cane - single point;BSC/3in1 (BSC too big)      Prior Function Prior Level of Function : Independent/Modified Independent                     Hand Dominance   Dominant Hand: Right    Extremity/Trunk Assessment   Upper Extremity Assessment Upper Extremity Assessment: Overall WFL for tasks assessed    Lower Extremity Assessment Lower Extremity Assessment: LLE deficits/detail LLE Deficits / Details: good quad activation, no extensor lag with SLR LLE Sensation: WNL LLE Coordination: WNL    Cervical / Trunk Assessment Cervical / Trunk Assessment: Normal  Communication   Communication: No difficulties  Cognition Arousal/Alertness: Awake/alert Behavior During Therapy: WFL for tasks assessed/performed Overall Cognitive Status: Within Functional Limits for tasks assessed                                          General Comments      Exercises Total Joint Exercises Ankle Circles/Pumps: AROM;Both;15 reps;Seated   Assessment/Plan    PT Assessment Patient needs continued PT services  PT Problem List Decreased strength;Decreased range of motion;Decreased activity tolerance;Decreased balance;Decreased mobility;Decreased knowledge of use of DME;Decreased safety awareness;Decreased knowledge of precautions;Pain       PT Treatment Interventions DME instruction;Gait training;Stair training;Functional mobility training;Therapeutic activities;Therapeutic exercise;Balance training;Patient/family education    PT Goals (Current goals can be found in the Care Plan section)  Acute Rehab PT Goals Patient Stated Goal: regain independence PT Goal Formulation: With patient Time For Goal Achievement: 10/10/21 Potential to Achieve Goals: Good    Frequency 7X/week   Barriers to discharge         Co-evaluation               AM-PAC PT "6 Clicks" Mobility  Outcome Measure Help needed turning from your back to your side while in a flat bed without using bedrails?: A Little Help needed moving from lying on your back to sitting on the side of a flat bed without using bedrails?: A Little Help needed moving to and from a bed to a chair (including a wheelchair)?: A Little Help needed standing up from a chair using your arms (e.g., wheelchair or bedside chair)?: A Little Help needed to walk in hospital room?: A Little Help needed climbing 3-5 steps with a railing? : A Lot 6 Click Score: 17    End of Session Equipment Utilized During Treatment: Gait belt Activity Tolerance: Patient tolerated treatment well;Patient limited by pain Patient left: in chair;with call bell/phone within reach;with chair alarm set Nurse Communication: Mobility status PT Visit Diagnosis: Other abnormalities of gait and mobility (R26.89);Muscle weakness (generalized) (M62.81);Difficulty in walking, not elsewhere classified (R26.2);Pain Pain - Right/Left: Left Pain - part of body: Knee    Time: 8299-3716 PT Time Calculation (min) (ACUTE ONLY): 21 min   Charges:   PT Evaluation $PT Eval Low Complexity: 1 Low          Verner Mould, DPT Acute Rehabilitation Services Office [phone] Pager [phone]   Jacques Navy 10/03/2021, 5:34 PM

## 2021-10-03 NOTE — Anesthesia Procedure Notes (Signed)
Anesthesia Regional Block: Adductor canal block   Pre-Anesthetic Checklist: , timeout performed,  Correct Patient, Correct Site, Correct Laterality,  Correct Procedure, Correct Position, site marked,  Risks and benefits discussed,  Surgical consent,  Pre-op evaluation,  At surgeon's request and post-op pain management  Laterality: Left  Prep: Maximum Sterile Barrier Precautions used, chloraprep       Needles:  Injection technique: Single-shot  Needle Type: Echogenic Stimulator Needle     Needle Length: 9cm  Needle Gauge: 22     Additional Needles:   Procedures:,,,, ultrasound used (permanent image in chart),,    Narrative:  Start time: 10/03/2021 8:55 AM End time: 10/03/2021 9:00 AM Injection made incrementally with aspirations every 5 mL.  Performed by: Personally  Anesthesiologist: Pervis Hocking, DO  Additional Notes: Monitors applied. No increased pain on injection. No increased resistance to injection. Injection made in 5cc increments. Good needle visualization. Patient tolerated procedure well.

## 2021-10-03 NOTE — Anesthesia Procedure Notes (Signed)
Spinal  Patient location during procedure: OR End time: 07/15/2021 10:14 AM Reason for block: surgical anesthesia Staffing Performed: resident/CRNA  Resident/CRNA: Maxwell Caul, CRNA Preanesthetic Checklist Completed: patient identified, IV checked, site marked, risks and benefits discussed, surgical consent, monitors and equipment checked, pre-op evaluation and timeout performed Spinal Block Patient position: sitting Prep: DuraPrep Patient monitoring: heart rate, cardiac monitor, continuous pulse ox and blood pressure Approach: midline Location: L3-4 Injection technique: single-shot Needle Needle type: Pencan  Needle gauge: 24 G Needle length: 10 cm Assessment Sensory level: T4 Events: CSF return Additional Notes IV functioning, monitors applied to pt. Expiration date of kit checked and confirmed to be in date. Sterile prep and drape, hand hygiene and sterile gloves used. Pt was positioned and spine was prepped in sterile fashion. Skin was anesthetized with lidocaine. Free flow of clear CSF obtained prior to injecting local anesthetic into CSF x 1 attempt. Spinal needle aspirated freely following injection. Needle was carefully withdrawn, and pt tolerated procedure well. Loss of motor and sensory on exam post injection. Dr Doroteo Glassman at bedside for entire placement.

## 2021-10-03 NOTE — Transfer of Care (Signed)
Immediate Anesthesia Transfer of Care Note  Patient: Carrie [name]  Procedure(s) Performed: LEFT TOTAL KNEE REVISION POLY SWAP WITH IRRIGATION AND DEBRIDEMENT (Left: Knee)  Patient Location: PACU  Anesthesia Type:Spinal  Level of Consciousness: awake, alert  and oriented  Airway & Oxygen Therapy: Patient Spontanous Breathing and Patient connected to face mask oxygen  Post-op Assessment: Report given to RN and Post -op Vital signs reviewed and stable  Post vital signs: Reviewed and stable  Last Vitals:  Vitals Value Taken Time  BP 128/69 10/03/21 1153  Temp    Pulse 80 10/03/21 1156  Resp 15 10/03/21 1156  SpO2 99 % 10/03/21 1156  Vitals shown include unvalidated device data.  Last Pain:  Vitals:   10/03/21 0910  TempSrc:   PainSc: 0-No pain      Patients Stated Pain Goal: 4 (79/39/03 0092)  Complications: No notable events documented.

## 2021-10-03 NOTE — Plan of Care (Signed)
  Problem: Education: Goal: Knowledge of General Education information will improve Description: Including pain rating scale, medication(s)/side effects and non-pharmacologic comfort measures Outcome: Progressing   Problem: Nutrition: Goal: Adequate nutrition will be maintained Outcome: Progressing   

## 2021-10-03 NOTE — Anesthesia Procedure Notes (Signed)
Procedure Name: MAC Date/Time: 10/03/2021 9:55 AM Performed by: Maxwell Caul, CRNA Pre-anesthesia Checklist: Patient identified, Emergency Drugs available, Suction available and Patient being monitored Oxygen Delivery Method: Simple face mask

## 2021-10-03 NOTE — Progress Notes (Signed)
Necessary anesthesia labs

## 2021-10-04 ENCOUNTER — Encounter (HOSPITAL_COMMUNITY): Payer: Self-pay | Admitting: Orthopaedic Surgery

## 2021-10-04 DIAGNOSIS — E1365 Other specified diabetes mellitus with hyperglycemia: Secondary | ICD-10-CM

## 2021-10-04 LAB — CBC
HCT: 24.9 % — ABNORMAL LOW (ref 36.0–46.0)
Hemoglobin: 8.1 g/dL — ABNORMAL LOW (ref 12.0–15.0)
MCH: 27.6 pg (ref 26.0–34.0)
MCHC: 32.5 g/dL (ref 30.0–36.0)
MCV: 84.7 fL (ref 80.0–100.0)
Platelets: 244 10*3/uL (ref 150–400)
RBC: 2.94 MIL/uL — ABNORMAL LOW (ref 3.87–5.11)
RDW: 14.4 % (ref 11.5–15.5)
WBC: 8.3 10*3/uL (ref 4.0–10.5)
nRBC: 0 % (ref 0.0–0.2)

## 2021-10-04 LAB — BASIC METABOLIC PANEL
Anion gap: 6 (ref 5–15)
BUN: 28 mg/dL — ABNORMAL HIGH (ref 8–23)
CO2: 25 mmol/L (ref 22–32)
Calcium: 8.2 mg/dL — ABNORMAL LOW (ref 8.9–10.3)
Chloride: 105 mmol/L (ref 98–111)
Creatinine, Ser: 1.04 mg/dL — ABNORMAL HIGH (ref 0.44–1.00)
GFR, Estimated: >60 mL/min (ref >60)
Glucose, Bld: 146 mg/dL — ABNORMAL HIGH (ref 70–99)
Potassium: 4.5 mmol/L (ref 3.5–5.1)
Sodium: 136 mmol/L (ref 135–145)

## 2021-10-04 LAB — PREPARE RBC (CROSSMATCH)

## 2021-10-04 LAB — GLUCOSE, CAPILLARY
Glucose-Capillary: 136 mg/dL — ABNORMAL HIGH (ref 70–99)
Glucose-Capillary: 122 mg/dL — ABNORMAL HIGH (ref 70–99)
Glucose-Capillary: 116 mg/dL — ABNORMAL HIGH (ref 70–99)
Glucose-Capillary: 130 mg/dL — ABNORMAL HIGH (ref 70–99)

## 2021-10-04 LAB — SEDIMENTATION RATE: Sed Rate: 85 mm/hr — ABNORMAL HIGH (ref 0–22)

## 2021-10-04 LAB — CK: Total CK: 39 U/L (ref 38–234)

## 2021-10-04 LAB — C-REACTIVE PROTEIN: CRP: 7.6 mg/dL — ABNORMAL HIGH (ref <1.0)

## 2021-10-04 MED ORDER — SODIUM CHLORIDE 0.9 % IV SOLN: INTRAVENOUS | Status: DC | PRN

## 2021-10-04 MED ORDER — SODIUM CHLORIDE 0.9% IV SOLUTION: Freq: Once | INTRAVENOUS | Status: DC

## 2021-10-04 NOTE — TOC Transition Note (Signed)
Transition of Care [facility]) - CM/SW Discharge Note   Patient Details  Name: Carrie [name] MRN: [id number] Date of Birth: 09-21-1956  Transition of Care [facility]) CM/SW Contact:  Lennart Pall, LCSW Phone Number: 10/04/2021, 1:49 PM   Clinical Narrative:    Met with pt this morning and reviewing anticipated dc needs.  No DME needs - has all needed at home already.  She is aware/ agreeable with plan for HHRN, HHPT and home IV abx and has no agency  preferences. Orders and referrals placed: Home IV abx with Carolynn Sayers, RN with [facility] [place] coverage via [place] coverage via [place] will sign off at this time.  Please re-order if changes to plan for new needs identified.   Final next level of care: [place] Barriers to Discharge: Continued Medical Work up   Patient Goals and CMS Choice Patient states their goals for this hospitalization and ongoing recovery are:: return home      Discharge Placement                       Discharge Plan and Services                DME Arranged: N/A DME Agency: NA       HH Arranged: RN, PT, IV Antibiotics HH Agency: Tour manager, Other - See comment, [place] ([place])     Representative spoke with at [place]: confirming with reps from all agencies today  Social Determinants of Health (SDOH) Interventions     Readmission Risk Interventions No flowsheet data found.

## 2021-10-04 NOTE — Progress Notes (Signed)
Physical Therapy Treatment Patient Details Name: Carrie [name] MRN: [id number] DOB: 09/09/1956 Today's Date: 10/04/2021   History of Present Illness Patient is 65 y.o. female s/p Lt TKR (poly exchange and I&D) on 10/03/21 with PMH significant for anxiety, asthma, depression, DM, GERD, HLD, HTN, neuropathy, osteoporosis, TIA, Bil TKA.    PT Comments    Patient is progressing well with acute therapy. Patient demonstrates safe technique with RW for transfers and gait. She completed stair mobility with no LOB noted and cues for sequencing. Reviewed HEP for supine exercises for ROM and encouraged to continue OOB activity for all meals. Patient will continue to benefit from skilled PT interventions during her stay. Acute PT will progress as able.    Recommendations for follow up therapy are one component of a multi-disciplinary discharge planning process, led by the attending physician.  Recommendations may be updated based on patient status, additional functional criteria and insurance authorization.  Follow Up Recommendations  Follow physician's recommendations for discharge plan and follow up therapies     Assistance Recommended at Discharge Intermittent Supervision/Assistance  Equipment Recommendations  None recommended by PT    Recommendations for Other Services       Precautions / Restrictions Precautions Precautions: Fall Restrictions Weight Bearing Restrictions: No Other Position/Activity Restrictions: WBAT     Mobility  Bed Mobility Overal bed mobility: Needs Assistance Bed Mobility: Supine to Sit;Sit to Supine     Supine to sit: HOB elevated;Supervision Sit to supine: Supervision;HOB elevated   General bed mobility comments: pt using bed rail to pivot to EOB, no assist or cues needed. pt able to raise bil LE's onto bed to return to supine.    Transfers Overall transfer level: Needs assistance Equipment used: Rolling walker (2 wheels) Transfers: Sit to/from  Stand Sit to Stand: Supervision           General transfer comment: pt demonstrated safe sequencing for power up from EOB and no assist or cues needed.    Ambulation/Gait Ambulation/Gait assistance: Min guard;Supervision Gait Distance (Feet): 160 Feet Assistive device: Rolling walker (2 wheels) Gait Pattern/deviations: Step-to pattern;Decreased stride length;Decreased weight shift to left Gait velocity: decr     General Gait Details: pt wih good carryover for safe step pattern and proximity to RW, no LOB noted or buckling at Lt knee noted.   Stairs Stairs: Yes Stairs assistance: Min guard Stair Management: Two rails;Step to pattern;Forwards Number of Stairs: 6 (2x3) General stair comments: cues for sequencing up with good, down with bad. pt completed 2x with no buckling or LOB.   Wheelchair Mobility    Modified Rankin (Stroke Patients Only)       Balance Overall balance assessment: Needs assistance Sitting-balance support: Feet supported Sitting balance-Leahy Scale: Good     Standing balance support: During functional activity;Reliant on assistive device for balance;Bilateral upper extremity supported Standing balance-Leahy Scale: Fair                              Cognition Arousal/Alertness: Awake/alert Behavior During Therapy: WFL for tasks assessed/performed Overall Cognitive Status: Within Functional Limits for tasks assessed                                          Exercises Total Joint Exercises Ankle Circles/Pumps: AROM;Both;15 reps;Seated Quad Sets: AROM;Left;Seated;10 reps Heel Slides: AROM;Left;Seated;10 reps Hip ABduction/ADduction: AROM;Left;5  reps;Seated    General Comments        Pertinent Vitals/Pain Pain Assessment: 0-10 Pain Score: 5  Pain Location: Lt knee Pain Descriptors / Indicators: Aching;Discomfort Pain Intervention(s): Limited activity within patient's tolerance;Monitored during  session;Repositioned;Ice applied    Home Living                          Prior Function            PT Goals (current goals can now be found in the care plan section) Acute Rehab PT Goals Patient Stated Goal: regain independence PT Goal Formulation: With patient Time For Goal Achievement: 10/10/21 Potential to Achieve Goals: Good Progress towards PT goals: Progressing toward goals    Frequency    7X/week      PT Plan Current plan remains appropriate    Co-evaluation              AM-PAC PT "6 Clicks" Mobility   Outcome Measure  Help needed turning from your back to your side while in a flat bed without using bedrails?: A Little Help needed moving from lying on your back to sitting on the side of a flat bed without using bedrails?: A Little Help needed moving to and from a bed to a chair (including a wheelchair)?: A Little Help needed standing up from a chair using your arms (e.g., wheelchair or bedside chair)?: A Little Help needed to walk in hospital room?: A Little Help needed climbing 3-5 steps with a railing? : A Little 6 Click Score: 18    End of Session Equipment Utilized During Treatment: Gait belt Activity Tolerance: Patient tolerated treatment well;Patient limited by pain Patient left: in chair;with call bell/phone within reach;with chair alarm set Nurse Communication: Mobility status PT Visit Diagnosis: Other abnormalities of gait and mobility (R26.89);Muscle weakness (generalized) (M62.81);Difficulty in walking, not elsewhere classified (R26.2);Pain Pain - Right/Left: Left Pain - part of body: Knee     Time: 2902-1115 PT Time Calculation (min) (ACUTE ONLY): 20 min  Charges:  $Gait Training: 8-22 mins                     Verner Mould, DPT Acute Rehabilitation Services Office [phone] Pager [phone]    Jacques Navy 10/04/2021, 3:26 PM

## 2021-10-04 NOTE — Op Note (Signed)
NAME: Carrie [name], Carrie [name] MEDICAL RECORD NO: [id number] ACCOUNT NO: [id number] DATE OF BIRTH: [birth year] FACILITY: [facility] LOCATION: [id number] PHYSICIAN: Monico Blitz. Rhona Raider, MD  Operative Report   DATE OF PROCEDURE: 10/03/2021  PREOPERATIVE DIAGNOSIS:  Infected left total knee replacement.  POSTOPERATIVE DIAGNOSIS:  Infected left total knee replacement.  PROCEDURES:    1.  Left knee irrigation and debridement. 2.  Left knee TKR revision poly swap.  ANESTHESIA: Spinal and block.  ATTENDING SURGEON:  Monico Blitz. Rhona Raider, MD  ASSISTANT:  Loni Dolly, PA  INDICATIONS:  The patient is a 65 year old woman, now 17 years out from a left knee replacement.  She did well until about 6 months ago when she was admitted to the hospital for pyelonephritis.  She has struggled with pain and swelling about her replaced  knee ever since.  We have performed several aspirations and have never grown any organisms that are presumed, diagnosis is low grade infection.  With disabling pain and continued swelling and dysfunction, she is offered operative intervention.  She has  some medical challenges and we discussed various options.  The textbook solution would be a 2-stage procedure with removal of all components with placement of a cement spacer and eventual return to the operating room for another knee replacement.  I did  not feel she would survive such an intervention and has an alternative.  We have elected to perform a thorough irrigation and debridement with synovectomy and exchange of the poly spacer for a one-component revision.  This is going to be followed by most  likely 6 weeks of IV antibiotics and potential lifetime oral suppression.  Informed operative consent was obtained after discussion of possible complications including reaction to anesthesia, fracture, and obviously continued infection.  SUMMARY FINDINGS AND PROCEDURE: Under spinal and sedation through her old incision, we exposed her knee replacement.   She did have some cloudiness to her joint fluid and this was again cultured prior to administration of any perioperative antibiotic.   The synovium was also grey tinged.  I performed a thorough synovectomy.  I then performed a one-component revision with exchange of the poly spacer, which again was a size 10 standard LCS by DeP[employer].  She was thoroughly irrigated and then closed over some  drains.  Loni Dolly, assisted throughout and was invaluable to the completion of the case and then he helped retract and close simultaneously to help minimize OR time.  DESCRIPTION OF PROCEDURE:  The patient was taken to the operating suite where spinal anesthetic was applied without difficulty.  She is also given a block in the preanesthesia area.  She was positioned supine and prepped and draped in normal sterile  fashion.  After an appropriate time-out, the left leg was elevated, exsanguinated and a tourniquet inflated about the thigh.  We used her old longitudinal midline incision with dissection down to the extensor mechanism.  I made a medial parapatellar  incision through this structure.  The kneecap was flipped and the knee flexed.  Findings were as noted above and we took cultures, followed by administration of the Kefzol perioperative antibiotic. I then performed a thorough synovectomy of some grey  tinged synovium.  I removed the existing polyethylene spacer and again irrigated.  We ran about 6 liters of fluid through the joint and also used some Prontosan solution through the knee as well.  We released the tourniquet and bleeding was controlled  with pressure and TXA sponge.  We also used the Bovie to further control a  small amount of bleeding.  I again irrigated and we eventually achieved excellent hemostasis.  We injected with Exparel and Marcaine followed by reapproximation of the extensor  mechanism in interrupted fashion with #1 Ethibond oversewn with a running stitch.  We then reapproximated subcutaneous  tissues with Vicryl and skin with a subcuticular stitch.  Steri-Strips were applied followed by Adaptic, dry gauze and a loose Ace  wrap.  ESTIMATED BLOOD LOSS AND INTRAOPERATIVE FLUIDS: Can be obtained from anesthesia records as can accurate tourniquet time.  DISPOSITION:  The patient was taken to recovery room in stable condition.  Plans were for her to be admitted with probable placement of some sort of access for long-term IV antibiotic use.  We also will obtain an ID consult.   MUK D: 10/03/2021 11:29:22 am T: 10/04/2021 12:41:00 am  JOB: [id number]/ [id number]

## 2021-10-04 NOTE — Progress Notes (Signed)
Subjective: 1 Day Post-Op Procedure(s) (LRB): LEFT TOTAL KNEE REVISION POLY SWAP WITH IRRIGATION AND DEBRIDEMENT (Left)  Patient is doing much better this morning. Her pain is much less. She got her PICC line yesterday. Her gram stain shows gram positive cocci but the culture is still pending. ID is following. Her Hgb this morning is 8.1 and was 11.1 preop. Drains remain in place and she said that it was emptied 3 times so far.  Activity level:  wbat Diet tolerance:  ok Voiding:  ok Patient reports pain as mild.    Objective: Vital signs in last 24 hours: Temp:  [98 F (36.7 C)-98.7 F (37.1 C)] 98.1 F (36.7 C) (12/21 0511) Pulse Rate:  [74-87] 79 (12/21 0511) Resp:  [12-22] 17 (12/21 0511) BP: (116-170)/(62-87) 147/87 (12/21 0511) SpO2:  [92 %-100 %] 96 % (12/21 0511) Weight:  [66.6 kg] 66.6 kg (12/20 0822)  Labs: Recent Labs    10/04/21 0407  HGB 8.1*   Recent Labs    10/04/21 0407  WBC 8.3  RBC 2.94*  HCT 24.9*  PLT 244   Recent Labs    10/04/21 0407  NA 136  K 4.5  CL 105  CO2 25  BUN 28*  CREATININE 1.04*  GLUCOSE 146*  CALCIUM 8.2*   Recent Labs    10/03/21 0832  INR 1.1    Physical Exam:  Neurologically intact ABD soft Neurovascular intact Sensation intact distally Intact pulses distally Dorsiflexion/Plantar flexion intact Incision: dressing C/D/I and no drainage No cellulitis present Compartment soft  Assessment/Plan:  1 Day Post-Op Procedure(s) (LRB): LEFT TOTAL KNEE REVISION POLY SWAP WITH IRRIGATION AND DEBRIDEMENT (Left) Advance diet Up with therapy D/C IV fluids Discharge home with home health likely Friday. Continue on 17m asa BID for DVT prevention. I will order 1 unit of blood due to her low Hgb and other underlying health issues. Once drain output below 312mper 12 hr shift then we will remove drain. We greatly appreciate ID help and management.    AnLarwance Sachsida 10/04/2021, 8:05 AM

## 2021-10-04 NOTE — Progress Notes (Signed)
Subjective: No new complaints   Antibiotics:  Anti-infectives (From admission, onward)    Start     Dose/Rate Route Frequency Ordered Stop   10/03/21 1545  DAPTOmycin (CUBICIN) 500 mg in sodium chloride 0.9 % IVPB        500 mg 120 mL/hr over 30 Minutes Intravenous Daily 10/03/21 1451     10/03/21 0800  ceFAZolin (ANCEF) IVPB 2g/100 mL premix        2 g 200 mL/hr over 30 Minutes Intravenous On call to O.R. 10/03/21 0748 10/03/21 1028       Medications: Scheduled Meds:  sodium chloride   Intravenous Once   amLODipine  5 mg Oral Daily   aspirin  81 mg Oral BID   budesonide  0.25 mg Nebulization BID   busPIRone  5 mg Oral BID   Chlorhexidine Gluconate Cloth  6 each Topical Daily   diltiazem  180 mg Oral Daily   docusate sodium  100 mg Oral BID   FLUoxetine  10 mg Oral BID   insulin aspart  0-15 Units Subcutaneous TID WC   irbesartan  300 mg Oral Daily   memantine  10 mg Oral BID   metFORMIN  1,000 mg Oral Q breakfast   pantoprazole  80 mg Oral Q1200   pravastatin  40 mg Oral Daily   primidone  250 mg Oral BID   sodium chloride flush  10-40 mL Intracatheter Q12H   Continuous Infusions:  DAPTOmycin (CUBICIN)  IV 500 mg (10/03/21 1637)   lactated ringers 50 mL/hr at 10/03/21 1450   methocarbamol (ROBAXIN) IV     PRN Meds:.acetaminophen, albuterol, alum & mag hydroxide-simeth, bisacodyl, diphenhydrAMINE, HYDROcodone-acetaminophen, HYDROcodone-acetaminophen, menthol-cetylpyridinium **OR** phenol, methocarbamol **OR** methocarbamol (ROBAXIN) IV, metoCLOPramide **OR** metoCLOPramide (REGLAN) injection, morphine injection, ondansetron **OR** ondansetron (ZOFRAN) IV, sodium chloride flush    Objective: Weight change:   Intake/Output Summary (Last 24 hours) at 10/04/2021 1432 Last data filed at 10/04/2021 1324 Gross per 24 hour  Intake 2026.54 ml  Output 3500 ml  Net -1473.46 ml   Blood pressure (!) 142/71, pulse 82, temperature 97.9 F (36.6 C), temperature  source Oral, resp. rate 18, height 5' (1.524 m), weight 66.6 kg, SpO2 98 %. Temp:  [97.6 F (36.4 C)-98.7 F (37.1 C)] 97.9 F (36.6 C) (12/21 1333) Pulse Rate:  [74-87] 82 (12/21 1333) Resp:  [17-18] 18 (12/21 1333) BP: (134-150)/(71-87) 142/71 (12/21 1333) SpO2:  [96 %-98 %] 98 % (12/21 1333)  Physical Exam: Physical Exam Constitutional:      General: She is not in acute distress.    Appearance: She is well-developed. She is not diaphoretic.  HENT:     Head: Normocephalic and atraumatic.     Right Ear: External ear normal.     Left Ear: External ear normal.     Mouth/Throat:     Pharynx: No oropharyngeal exudate.  Eyes:     General: No scleral icterus.    Conjunctiva/sclera: Conjunctivae normal.     Pupils: Pupils are equal, round, and reactive to light.  Cardiovascular:     Rate and Rhythm: Normal rate and regular rhythm.  Pulmonary:     Effort: Pulmonary effort is normal. No respiratory distress.     Breath sounds: Normal breath sounds. No wheezing.  Abdominal:     General: Bowel sounds are normal. There is no distension.     Palpations: Abdomen is soft.     Tenderness: There is no abdominal tenderness. There is  no rebound.  Musculoskeletal:        General: No tenderness. Normal range of motion.  Lymphadenopathy:     Cervical: No cervical adenopathy.  Skin:    General: Skin is warm and dry.     Coloration: Skin is pale.     Findings: No erythema or rash.  Neurological:     General: No focal deficit present.     Mental Status: She is alert and oriented to person, place, and time.     Motor: No abnormal muscle tone.     Coordination: Coordination normal.  Psychiatric:        Mood and Affect: Mood normal.        Behavior: Behavior normal.        Thought Content: Thought content normal.        Judgment: Judgment normal.    Knee bandaged  CBC:    BMET Recent Labs    10/04/21 0407  NA 136  K 4.5  CL 105  CO2 25  GLUCOSE 146*  BUN 28*  CREATININE  1.04*  CALCIUM 8.2*     Liver Panel  No results for input(s): PROT, ALBUMIN, AST, ALT, ALKPHOS, BILITOT, BILIDIR, IBILI in the last 72 hours.     Sedimentation Rate Recent Labs    10/04/21 0407  ESRSEDRATE 85*   C-Reactive Protein Recent Labs    10/04/21 0407  CRP 7.6*    Micro Results: Recent Results (from the past 720 hour(s))  Surgical pcr screen     Status: None   Collection Time: 09/25/21 11:47 AM   Specimen: Nasal Mucosa; Nasal Swab  Result Value Ref Range Status   MRSA, PCR NEGATIVE NEGATIVE Final   Staphylococcus aureus NEGATIVE NEGATIVE Final    Comment: (NOTE) The Xpert SA Assay (FDA approved for NASAL specimens in patients 72 years of age and older), is one component of a comprehensive surveillance program. It is not intended to diagnose infection nor to guide or monitor treatment. Performed at [facility], [place] [address] [place], [place] [address]   SARS Coronavirus 2 (TAT 6-24 hrs)     Status: None   Collection Time: 09/29/21 12:00 AM  Result Value Ref Range Status   SARS Coronavirus 2 RESULT: NEGATIVE  Final    Comment: RESULT: NEGATIVESARS-CoV-2 INTERPRETATION:A NEGATIVE  test result means that SARS-CoV-2 RNA was not present in the specimen above the limit of detection of this test. This does not preclude a possible SARS-CoV-2 infection and should not be used as the  sole basis for patient management decisions. Negative results must be combined with clinical observations, patient history, and epidemiological information. Optimum specimen types and timing for peak viral levels during infections caused by SARS-CoV-2  have not been determined. Collection of multiple specimens or types of specimens may be necessary to detect virus. Improper specimen collection and handling, sequence variability under primers/probes, or organism present below the limit of detection may  lead to false negative results. Positive and negative predictive  values of testing are highly dependent on prevalence. False negative test results are more likely when prevalence of disease is high.The expected result is NEGATIVE.Fact S heet for  Healthcare Providers: LocalChronicle.no Sheet for Patients: SalonLookup.es Reference Range - Negative   Aerobic/Anaerobic Culture w Gram Stain (surgical/deep wound)     Status: None (Preliminary result)   Collection Time: 10/03/21 10:38 AM   Specimen: PATH Cytology Misc. fluid; Body Fluid  Result Value Ref Range Status   Specimen Description  Final    SYNOVIAL LT KNEE Performed at [facility], [place] [address], [place], [place] [address]    Special Requests   Final    NONE Performed at [facility], [place] [address], [place], [place] [address]    Gram Stain   Final    FEW WBC PRESENT, PREDOMINANTLY MONONUCLEAR RARE GRAM POSITIVE COCCI    Culture   Final    NO GROWTH < 24 HOURS Performed at [facility], [place] [address], [place], [place] [address]    Report Status PENDING  Incomplete    Studies/Results: [place] EKG SITE RITE  Result Date: 10/03/2021 If Site Rite image not attached, placement could not be confirmed due to current cardiac rhythm.     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  #1 PJI:  Continue daptomycin and if no growth occurs we will use this  antibiotic for 6 weeks followed by oral antibiotics-- possible doxycycline and cefadroxil if we cannot isolate organism with sensis and treat for at least 6 months postoperatively  #2  Psoriatic arthritis: She needs to avoid immunosuppressive therapies.  I spent 73mnutes with the patient including  face to face counseling of the patient the nature of prosthetic joint infections personally reviewing plain films, CBC BMP sed rate CRP culture data, along with review of medical records in preparation for the visit and during the visit and in coordination of her care.    LOS: 1 day   CAlcide Evener12/21/2022, 2:32 PM

## 2021-10-04 NOTE — Progress Notes (Signed)
Physical Therapy Treatment Patient Details Name: Carrie [name] MRN: [id number] DOB: 08/27/1956 Today's Date: 10/04/2021   History of Present Illness Patient is 65 y.o. female s/p Lt TKR (poly exchange and I&D) on 10/03/21 with PMH significant for anxiety, asthma, depression, DM, GERD, HLD, HTN, neuropathy, osteoporosis, TIA, Bil TKA.    PT Comments    Patient progressing well with acute therapy and pain more controlled this AM. Pt demonstrated good recall for safe technique with RW during transfers and gait. No LOB observed throughout session. Pt initiated exercises for ROM in sitting with no significant increase in pain. EOS pt resting in recliner, ice applied for pain management. Pt will benefit from continued skilled PT interventions to progress gait/mobility for stair training and HEP review. Acute PT will progress as able throughout stay.      Recommendations for follow up therapy are one component of a multi-disciplinary discharge planning process, led by the attending physician.  Recommendations may be updated based on patient status, additional functional criteria and insurance authorization.  Follow Up Recommendations  Follow physician's recommendations for discharge plan and follow up therapies     Assistance Recommended at Discharge Intermittent Supervision/Assistance  Equipment Recommendations  None recommended by PT    Recommendations for Other Services       Precautions / Restrictions Precautions Precautions: Fall Restrictions Weight Bearing Restrictions: No Other Position/Activity Restrictions: WBAT     Mobility  Bed Mobility Overal bed mobility: Needs Assistance Bed Mobility: Supine to Sit     Supine to sit: HOB elevated;Min guard;Supervision     General bed mobility comments: pt taking extra time, sat up to long sitting and pivoted to EOB, no assist needed.    Transfers Overall transfer level: Needs assistance Equipment used: Rolling walker (2  wheels) Transfers: Sit to/from Stand Sit to Stand: Min guard;Supervision           General transfer comment: pt wiht good carryover/recall for safe hand placement with power up. steady with rise and no assist needed.    Ambulation/Gait Ambulation/Gait assistance: Min assist;Min guard Gait Distance (Feet): 170 Feet Assistive device: Rolling walker (2 wheels) Gait Pattern/deviations: Step-to pattern;Decreased stride length;Decreased weight shift to left Gait velocity: decr     General Gait Details: cues for safe proximity to RW intermittently, pt maintained safe step to pattern with no LOB or buckling at Lt knee.   Stairs             Wheelchair Mobility    Modified Rankin (Stroke Patients Only)       Balance Overall balance assessment: Needs assistance Sitting-balance support: Feet supported Sitting balance-Leahy Scale: Good     Standing balance support: During functional activity;Reliant on assistive device for balance;Bilateral upper extremity supported Standing balance-Leahy Scale: Fair                              Cognition Arousal/Alertness: Awake/alert Behavior During Therapy: WFL for tasks assessed/performed Overall Cognitive Status: Within Functional Limits for tasks assessed                                          Exercises Total Joint Exercises Ankle Circles/Pumps: AROM;Both;15 reps;Seated Quad Sets: AROM;Left;5 reps;Seated Short Arc Quad: AROM;Left;5 reps;Seated Heel Slides: AROM;Left;5 reps;Seated    General Comments        Pertinent Vitals/Pain Pain Assessment:  0-10 Pain Score: 4  Pain Location: Lt knee Pain Descriptors / Indicators: Aching;Discomfort Pain Intervention(s): Limited activity within patient's tolerance;Monitored during session;Repositioned    Home Living                          Prior Function            PT Goals (current goals can now be found in the care plan section)  Acute Rehab PT Goals Patient Stated Goal: regain independence PT Goal Formulation: With patient Time For Goal Achievement: 10/10/21 Potential to Achieve Goals: Good Progress towards PT goals: Progressing toward goals    Frequency    7X/week      PT Plan Current plan remains appropriate    Co-evaluation              AM-PAC PT "6 Clicks" Mobility   Outcome Measure  Help needed turning from your back to your side while in a flat bed without using bedrails?: A Little Help needed moving from lying on your back to sitting on the side of a flat bed without using bedrails?: A Little Help needed moving to and from a bed to a chair (including a wheelchair)?: A Little Help needed standing up from a chair using your arms (e.g., wheelchair or bedside chair)?: A Little Help needed to walk in hospital room?: A Little Help needed climbing 3-5 steps with a railing? : A Little 6 Click Score: 18    End of Session Equipment Utilized During Treatment: Gait belt Activity Tolerance: Patient tolerated treatment well;Patient limited by pain Patient left: in chair;with call bell/phone within reach;with chair alarm set Nurse Communication: Mobility status PT Visit Diagnosis: Other abnormalities of gait and mobility (R26.89);Muscle weakness (generalized) (M62.81);Difficulty in walking, not elsewhere classified (R26.2);Pain Pain - Right/Left: Left Pain - part of body: Knee     Time: 2440-1027 PT Time Calculation (min) (ACUTE ONLY): 28 min  Charges:  $Gait Training: 8-22 mins $Therapeutic Exercise: 8-22 mins                     Verner Mould, DPT Acute Rehabilitation Services Office [phone] Pager [phone]    Jacques Navy 10/04/2021, 9:11 AM

## 2021-10-05 LAB — BPAM RBC
Blood Product Expiration Date: 202301162359
ISSUE DATE / TIME: 202212211144
Unit Type and Rh: 6200

## 2021-10-05 LAB — GLUCOSE, CAPILLARY
Glucose-Capillary: 133 mg/dL — ABNORMAL HIGH (ref 70–99)
Glucose-Capillary: 108 mg/dL — ABNORMAL HIGH (ref 70–99)
Glucose-Capillary: 105 mg/dL — ABNORMAL HIGH (ref 70–99)
Glucose-Capillary: 129 mg/dL — ABNORMAL HIGH (ref 70–99)

## 2021-10-05 LAB — CBC
HCT: 29.1 % — ABNORMAL LOW (ref 36.0–46.0)
Hemoglobin: 9.4 g/dL — ABNORMAL LOW (ref 12.0–15.0)
MCH: 28 pg (ref 26.0–34.0)
MCHC: 32.3 g/dL (ref 30.0–36.0)
MCV: 86.6 fL (ref 80.0–100.0)
Platelets: 223 10*3/uL (ref 150–400)
RBC: 3.36 MIL/uL — ABNORMAL LOW (ref 3.87–5.11)
RDW: 14.7 % (ref 11.5–15.5)
WBC: 6.9 10*3/uL (ref 4.0–10.5)
nRBC: 0 % (ref 0.0–0.2)

## 2021-10-05 LAB — TYPE AND SCREEN
ABO/RH(D): A POS
Antibody Screen: NEGATIVE
Unit division: 0

## 2021-10-05 NOTE — Progress Notes (Signed)
PHARMACY CONSULT NOTE FOR:  OUTPATIENT  PARENTERAL ANTIBIOTIC THERAPY (OPAT)  Indication: Left knee PJI Regimen: Daptomycin 500 mg every 24 hours  End date: 11/13/21  IV antibiotic discharge orders are pended. To discharging provider:  please sign these orders via discharge navigator,  Select New Orders & click on the button choice - Manage This Unsigned Work.     Thank you for allowing pharmacy to be a part of this patient's care.  Jimmy Footman, PharmD, BCPS, BCIDP Infectious Diseases Clinical Pharmacist Phone: [phone] 10/05/2021, 11:04 AM

## 2021-10-05 NOTE — Progress Notes (Signed)
Physical Therapy Treatment Patient Details Name: Carrie [name] MRN: [id number] DOB: Sep 05, 1956 Today's Date: 10/05/2021   History of Present Illness Patient is 65 y.o. female s/p Lt TKR (poly exchange and I&D) on 10/03/21 with PMH significant for anxiety, asthma, depression, DM, GERD, HLD, HTN, neuropathy, osteoporosis, TIA, Bil TKA.    PT Comments    The patient reports pain escalated over night, now improved. Ambulated x 120'. Continue PT. Will see again this PM, ? Plans for Dc today.   Recommendations for follow up therapy are one component of a multi-disciplinary discharge planning process, led by the attending physician.  Recommendations may be updated based on patient status, additional functional criteria and insurance authorization.  Follow Up Recommendations  Follow physician's recommendations for discharge plan and follow up therapies     Assistance Recommended at Discharge Intermittent Supervision/Assistance  Equipment Recommendations  None recommended by PT    Recommendations for Other Services       Precautions / Restrictions Precautions Precautions: Fall;Knee     Mobility  Bed Mobility   Bed Mobility: Supine to Sit;Sit to Supine     Supine to sit: HOB elevated;Supervision Sit to supine: Supervision;HOB elevated   General bed mobility comments: pt using bed rail to pivot to EOB, no assist or cues needed. pt able to raise bil LE's onto bed to return to supine.    Transfers Overall transfer level: Needs assistance Equipment used: Rolling walker (2 wheels) Transfers: Sit to/from Stand Sit to Stand: Supervision           General transfer comment: pt demonstrated safe sequencing for power up from EOB and no assist or cues needed.    Ambulation/Gait Ambulation/Gait assistance: Min guard;Supervision Gait Distance (Feet): 20 Feet (then 120) Assistive device: Rolling walker (2 wheels) Gait Pattern/deviations: Step-to pattern;Decreased stride  length;Decreased weight shift to left       General Gait Details: pt wih good carryover for safe step pattern and proximity to RW, no LOB noted or buckling at Lt knee noted.   Stairs             Wheelchair Mobility    Modified Rankin (Stroke Patients Only)       Balance   Sitting-balance support: Feet supported Sitting balance-Leahy Scale: Good     Standing balance support: During functional activity;Reliant on assistive device for balance;Bilateral upper extremity supported Standing balance-Leahy Scale: Fair                              Cognition Arousal/Alertness: Awake/alert Behavior During Therapy: WFL for tasks assessed/performed Overall Cognitive Status: Within Functional Limits for tasks assessed                                          Exercises Total Joint Exercises Ankle Circles/Pumps: PROM;Both;10 reps Quad Sets: AROM;Both;10 reps Heel Slides: AROM;Left;5 reps;Supine Hip ABduction/ADduction: AROM;Left;5 reps;Seated Straight Leg Raises: AAROM;Left;5 reps;Supine    General Comments        Pertinent Vitals/Pain Pain Score: 8  Pain Location: Lt knee Pain Descriptors / Indicators: Aching;Discomfort Pain Intervention(s): Monitored during session;Premedicated before session;Ice applied    Home Living                          Prior Function  PT Goals (current goals can now be found in the care plan section) Progress towards PT goals: Progressing toward goals    Frequency    7X/week      PT Plan Current plan remains appropriate    Co-evaluation              AM-PAC PT "6 Clicks" Mobility   Outcome Measure  Help needed turning from your back to your side while in a flat bed without using bedrails?: A Little Help needed moving from lying on your back to sitting on the side of a flat bed without using bedrails?: A Little Help needed moving to and from a bed to a chair (including a  wheelchair)?: A Little Help needed standing up from a chair using your arms (e.g., wheelchair or bedside chair)?: A Little Help needed to walk in hospital room?: A Little Help needed climbing 3-5 steps with a railing? : A Little 6 Click Score: 18    End of Session Equipment Utilized During Treatment: Gait belt Activity Tolerance: Patient tolerated treatment well;Patient limited by pain Patient left: in bed;with call bell/phone within reach;with bed alarm set Nurse Communication: Mobility status PT Visit Diagnosis: Other abnormalities of gait and mobility (R26.89);Muscle weakness (generalized) (M62.81);Difficulty in walking, not elsewhere classified (R26.2);Pain Pain - Right/Left: Left Pain - part of body: Knee     Time: 6720-9198 PT Time Calculation (min) (ACUTE ONLY): 25 min  Charges:  $Gait Training: 8-22 mins $Therapeutic Exercise: 8-22 mins                     Tresa Endo PT Acute Rehabilitation Services Pager [phone] Office [phone]    Claretha Cooper 10/05/2021, 1:34 PM

## 2021-10-05 NOTE — Clinical Social Work Note (Signed)
[facility] has OPAT orders and will provide teaching today. TOC signing off.

## 2021-10-05 NOTE — Progress Notes (Addendum)
Subjective: Worsening knee pain overnight now status post removal of drains   Antibiotics:  Anti-infectives (From admission, onward)    Start     Dose/Rate Route Frequency Ordered Stop   10/03/21 1545  DAPTOmycin (CUBICIN) 500 mg in sodium chloride 0.9 % IVPB        500 mg 120 mL/hr over 30 Minutes Intravenous Daily 10/03/21 1451     10/03/21 0800  ceFAZolin (ANCEF) IVPB 2g/100 mL premix        2 g 200 mL/hr over 30 Minutes Intravenous On call to O.R. 10/03/21 0748 10/03/21 1028       Medications: Scheduled Meds:  sodium chloride   Intravenous Once   amLODipine  5 mg Oral Daily   aspirin  81 mg Oral BID   budesonide  0.25 mg Nebulization BID   busPIRone  5 mg Oral BID   Chlorhexidine Gluconate Cloth  6 each Topical Daily   diltiazem  180 mg Oral Daily   docusate sodium  100 mg Oral BID   FLUoxetine  10 mg Oral BID   insulin aspart  0-15 Units Subcutaneous TID WC   irbesartan  300 mg Oral Daily   memantine  10 mg Oral BID   metFORMIN  1,000 mg Oral Q breakfast   pantoprazole  80 mg Oral Q1200   pravastatin  40 mg Oral Daily   primidone  250 mg Oral BID   sodium chloride flush  10-40 mL Intracatheter Q12H   Continuous Infusions:  sodium chloride 10 mL/hr at 10/05/21 0931   DAPTOmycin (CUBICIN)  IV 500 mg (10/04/21 2002)   lactated ringers 50 mL/hr at 10/03/21 1450   methocarbamol (ROBAXIN) IV     PRN Meds:.sodium chloride, acetaminophen, albuterol, alum & mag hydroxide-simeth, bisacodyl, diphenhydrAMINE, HYDROcodone-acetaminophen, HYDROcodone-acetaminophen, menthol-cetylpyridinium **OR** phenol, methocarbamol **OR** methocarbamol (ROBAXIN) IV, metoCLOPramide **OR** metoCLOPramide (REGLAN) injection, morphine injection, ondansetron **OR** ondansetron (ZOFRAN) IV, sodium chloride flush    Objective: Weight change:   Intake/Output Summary (Last 24 hours) at 10/05/2021 1206 Last data filed at 10/05/2021 0620 Gross per 24 hour  Intake 1034 ml  Output 1067  ml  Net -33 ml    Blood pressure (!) 172/77, pulse 97, temperature 98.8 F (37.1 C), temperature source Oral, resp. rate 16, height 5' (1.524 m), weight 66.6 kg, SpO2 96 %. Temp:  [97.6 F (36.4 C)-98.8 F (37.1 C)] 98.8 F (37.1 C) (12/22 0457) Pulse Rate:  [74-97] 97 (12/22 0457) Resp:  [16-18] 16 (12/22 0457) BP: (142-172)/(71-81) 172/77 (12/22 0457) SpO2:  [94 %-100 %] 96 % (12/22 0829)  Physical Exam: Physical Exam Constitutional:      General: She is not in acute distress.    Appearance: She is well-developed. She is not diaphoretic.  HENT:     Head: Normocephalic and atraumatic.     Right Ear: External ear normal.     Left Ear: External ear normal.     Mouth/Throat:     Pharynx: No oropharyngeal exudate.  Eyes:     General: No scleral icterus.    Conjunctiva/sclera: Conjunctivae normal.     Pupils: Pupils are equal, round, and reactive to light.  Cardiovascular:     Rate and Rhythm: Normal rate and regular rhythm.  Pulmonary:     Effort: Pulmonary effort is normal. No respiratory distress.     Breath sounds: No wheezing.  Abdominal:     General: There is no distension.     Palpations: Abdomen is  soft.     Tenderness: There is no abdominal tenderness.  Lymphadenopathy:     Cervical: No cervical adenopathy.  Skin:    General: Skin is warm and dry.     Coloration: Skin is not pale.     Findings: No erythema or rash.  Neurological:     General: No focal deficit present.     Mental Status: She is alert and oriented to person, place, and time.     Motor: No abnormal muscle tone.     Coordination: Coordination normal.  Psychiatric:        Mood and Affect: Mood normal.        Behavior: Behavior normal.        Thought Content: Thought content normal.        Judgment: Judgment normal.    Knee bandaged  CBC:    BMET Recent Labs    10/04/21 0407  NA 136  K 4.5  CL 105  CO2 25  GLUCOSE 146*  BUN 28*  CREATININE 1.04*  CALCIUM 8.2*      Liver  Panel  No results for input(s): PROT, ALBUMIN, AST, ALT, ALKPHOS, BILITOT, BILIDIR, IBILI in the last 72 hours.     Sedimentation Rate Recent Labs    10/04/21 0407  ESRSEDRATE 85*    C-Reactive Protein Recent Labs    10/04/21 0407  CRP 7.6*     Micro Results: Recent Results (from the past 720 hour(s))  Surgical pcr screen     Status: None   Collection Time: 09/25/21 11:47 AM   Specimen: Nasal Mucosa; Nasal Swab  Result Value Ref Range Status   MRSA, PCR NEGATIVE NEGATIVE Final   Staphylococcus aureus NEGATIVE NEGATIVE Final    Comment: (NOTE) The Xpert SA Assay (FDA approved for NASAL specimens in patients 94 years of age and older), is one component of a comprehensive surveillance program. It is not intended to diagnose infection nor to guide or monitor treatment. Performed at [facility], [place] [address], [place], [place] [address]   SARS Coronavirus 2 (TAT 6-24 hrs)     Status: None   Collection Time: 09/29/21 12:00 AM  Result Value Ref Range Status   SARS Coronavirus 2 RESULT: NEGATIVE  Final    Comment: RESULT: NEGATIVESARS-CoV-2 INTERPRETATION:A NEGATIVE  test result means that SARS-CoV-2 RNA was not present in the specimen above the limit of detection of this test. This does not preclude a possible SARS-CoV-2 infection and should not be used as the  sole basis for patient management decisions. Negative results must be combined with clinical observations, patient history, and epidemiological information. Optimum specimen types and timing for peak viral levels during infections caused by SARS-CoV-2  have not been determined. Collection of multiple specimens or types of specimens may be necessary to detect virus. Improper specimen collection and handling, sequence variability under primers/probes, or organism present below the limit of detection may  lead to false negative results. Positive and negative predictive values of testing are highly  dependent on prevalence. False negative test results are more likely when prevalence of disease is high.The expected result is NEGATIVE.Fact S heet for  Healthcare Providers: LocalChronicle.no Sheet for Patients: SalonLookup.es Reference Range - Negative   Aerobic/Anaerobic Culture w Gram Stain (surgical/deep wound)     Status: None (Preliminary result)   Collection Time: 10/03/21 10:38 AM   Specimen: PATH Cytology Misc. fluid; Body Fluid  Result Value Ref Range Status   Specimen Description   Final  SYNOVIAL LT KNEE Performed at [facility], [address] [place], [place] [address]    Special Requests   Final    NONE Performed at [facility], [place] [address] [place], [place] [address]    Gram Stain   Final    FEW WBC PRESENT, PREDOMINANTLY MONONUCLEAR RARE GRAM POSITIVE COCCI    Culture   Final    NO GROWTH 2 DAYS Performed at [facility], [place] [address] [place], [place] [address]    Report Status PENDING  Incomplete    Studies/Results: [place] EKG SITE RITE  Result Date: 10/03/2021 If Site Rite image not attached, placement could not be confirmed due to current cardiac rhythm.     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  #1 PJI:  Suspect we will not isolate an organism but will continue to follow the cultures.  From our standpoint we do not have to keep  the patient in the hospital to await finalization of her cultures as her antibiotics can certainly be adjusted after she is discharged  #2  Psoriatic arthritis: Again she should avoid potent immunosuppressive drugs and particular "Biologics"  Diagnosis: PJI  Culture Result: Pending  Allergies  Allergen Reactions   Lisinopril Cough    OPAT Orders Discharge antibiotics to be given via PICC line Discharge antibiotics: Daptomycin 500 mg every 24 hours   Duration: 6 weeks  End Date:  1/390/2023  [facility] Care Per Protocol:  Home health RN for IV administration and teaching; PICC line care and labs.    Labs weekly while on IV antibiotics: __x CBC with differential _x_ BMP  _x_ CRP _x_ ESR  _x_ CK  _x_ Please pull PIC at completion of IV antibiotics __ Please leave PIC in place until doctor has seen patient or been notified  Fax weekly labs to [phone]  Clinic Follow Up Appt:    ONYX EDGLEY has an appointment on 11/07/2021 with Dr. Candiss Norse at  [facility] is located in the [facility] at  [address] in [place].  [address], which is located to the left of the elevators.  Phone: [phone]  Fax: [phone]  [email or web address]  She should arrive 30 minutes prior to her appointment.    LOS: 2 days   Alcide Evener 10/05/2021, 12:06 PM

## 2021-10-05 NOTE — Progress Notes (Signed)
Subjective: 2 Days Post-Op Procedure(s) (LRB): LEFT TOTAL KNEE REVISION POLY SWAP WITH IRRIGATION AND DEBRIDEMENT (Left)  Patient doing well. A little more pain this morning from the block wearing off. Her culture has not grown anything yet. She would like to go home today if possible.  Activity level:  wbat Diet tolerance:  ok Voiding:  ok Patient reports pain as mild.    Objective: Vital signs in last 24 hours: Temp:  [97.6 F (36.4 C)-98.8 F (37.1 C)] 98.8 F (37.1 C) (12/22 0457) Pulse Rate:  [74-97] 97 (12/22 0457) Resp:  [16-18] 16 (12/22 0457) BP: (134-172)/(71-81) 172/77 (12/22 0457) SpO2:  [94 %-100 %] 94 % (12/22 0457)  Labs: Recent Labs    10/04/21 0407 10/05/21 0354  HGB 8.1* 9.4*   Recent Labs    10/04/21 0407 10/05/21 0354  WBC 8.3 6.9  RBC 2.94* 3.36*  HCT 24.9* 29.1*  PLT 244 223   Recent Labs    10/04/21 0407  NA 136  K 4.5  CL 105  CO2 25  BUN 28*  CREATININE 1.04*  GLUCOSE 146*  CALCIUM 8.2*   Recent Labs    10/03/21 0832  INR 1.1    Physical Exam:  Neurologically intact ABD soft Neurovascular intact Sensation intact distally Intact pulses distally Dorsiflexion/Plantar flexion intact Incision: dressing C/D/I No cellulitis present Compartment soft  Assessment/Plan:  2 Days Post-Op Procedure(s) (LRB): LEFT TOTAL KNEE REVISION POLY SWAP WITH IRRIGATION AND DEBRIDEMENT (Left) Advance diet Up with therapy Discharge home with home health possibly today or tomorrow if cleared by ID and if her pain is well controlled. We greatly appreciate ID management. We will continue to follow cultures. Continue on 75m asa BID for DVT prevention. Follow up in office 2 weeks post op.  I pulled drain today and applied pressure bandage. I will come back and check on her this afternoon and if cleared by ID and then I will discharge her today.  ALarwance SachsNida 10/05/2021, 7:07 AM

## 2021-10-05 NOTE — Plan of Care (Signed)
°  Problem: Clinical Measurements: Goal: Ability to maintain clinical measurements within normal limits will improve Outcome: Progressing   Problem: Health Behavior/Discharge Planning: Goal: Ability to manage health-related needs will improve Outcome: Progressing   Problem: Activity: Goal: Risk for activity intolerance will decrease Outcome: Progressing   Problem: Pain Managment: Goal: General experience of comfort will improve Outcome: Progressing

## 2021-10-05 NOTE — Plan of Care (Signed)
Plan of care reviewed and discussed with the patient. 

## 2021-10-05 NOTE — Progress Notes (Signed)
Physical Therapy Treatment Patient Details Name: Carrie [name] MRN: [id number] DOB: 1955/11/22 Today's Date: 10/05/2021   History of Present Illness Patient is 65 y.o. female s/p Lt TKR (poly exchange and I&D) on 10/03/21 with PMH significant for anxiety, asthma, depression, DM, GERD, HLD, HTN, neuropathy, osteoporosis, TIA, Bil TKA.    PT Comments    Patient  mobilized and ambulated  x 80'. Patient expresses pain level concerns for Dc home today. Patient reports PA to touch base with her today. Patient has achieved safe level of mobility and practiced steps for Dc if MD so desires.  Recommendations for follow up therapy are one component of a multi-disciplinary discharge planning process, led by the attending physician.  Recommendations may be updated based on patient status, additional functional criteria and insurance authorization.  Follow Up Recommendations  Follow physician's recommendations for discharge plan and follow up therapies     Assistance Recommended at Discharge Intermittent Supervision/Assistance  Equipment Recommendations  None recommended by PT    Recommendations for Other Services       Precautions / Restrictions Precautions Precautions: Fall;Knee     Mobility  Bed Mobility   Bed Mobility: Supine to Sit;Sit to Supine     Supine to sit: HOB elevated;Supervision Sit to supine: Supervision;HOB elevated   General bed mobility comments: patient self assisted with right foot.    Transfers Overall transfer level: Needs assistance Equipment used: Rolling walker (2 wheels) Transfers: Sit to/from Stand Sit to Stand: Supervision           General transfer comment: pt demonstrated safe sequencing for power up from EOB and no assist or cues needed.    Ambulation/Gait Ambulation/Gait assistance: Min guard;Supervision Gait Distance (Feet): 20 Feet (then 80) Assistive device: Rolling walker (2 wheels) Gait Pattern/deviations: Step-to pattern;Decreased  stride length;Decreased weight shift to left       General Gait Details: pt wih good carryover for safe step pattern and proximity to RW, no LOB noted or buckling at Lt knee noted.   Stairs             Wheelchair Mobility    Modified Rankin (Stroke Patients Only)       Balance   Sitting-balance support: Feet supported Sitting balance-Leahy Scale: Good     Standing balance support: During functional activity;Reliant on assistive device for balance;Bilateral upper extremity supported Standing balance-Leahy Scale: Fair                              Cognition Arousal/Alertness: Awake/alert Behavior During Therapy: WFL for tasks assessed/performed Overall Cognitive Status: Within Functional Limits for tasks assessed                                          Exercises Total Joint Exercises Ankle Circles/Pumps: PROM;Both;10 reps Quad Sets: AROM;Both;10 reps Heel Slides: AROM;Left;5 reps;Supine Hip ABduction/ADduction: AROM;Left;5 reps;Seated Straight Leg Raises: AAROM;Left;5 reps;Supine    General Comments        Pertinent Vitals/Pain Pain Score: 3  Pain Location: Lt knee Pain Descriptors / Indicators: Aching;Discomfort Pain Intervention(s): Monitored during session    Home Living                          Prior Function            PT Goals (  current goals can now be found in the care plan section) Progress towards PT goals: Progressing toward goals    Frequency    7X/week      PT Plan Current plan remains appropriate    Co-evaluation              AM-PAC PT "6 Clicks" Mobility   Outcome Measure  Help needed turning from your back to your side while in a flat bed without using bedrails?: A Little Help needed moving from lying on your back to sitting on the side of a flat bed without using bedrails?: A Little Help needed moving to and from a bed to a chair (including a wheelchair)?: A Little Help  needed standing up from a chair using your arms (e.g., wheelchair or bedside chair)?: A Little Help needed to walk in hospital room?: A Little Help needed climbing 3-5 steps with a railing? : A Little 6 Click Score: 18    End of Session Equipment Utilized During Treatment: Gait belt Activity Tolerance: Patient tolerated treatment well;Patient limited by pain Patient left: in bed;with call bell/phone within reach;with bed alarm set Nurse Communication: Mobility status PT Visit Diagnosis: Other abnormalities of gait and mobility (R26.89);Muscle weakness (generalized) (M62.81);Difficulty in walking, not elsewhere classified (R26.2);Pain Pain - Right/Left: Left Pain - part of body: Knee     Time: 1460-4799 PT Time Calculation (min) (ACUTE ONLY): 17 min  Charges:  $Gait Training: 8-22 mins $Therapeutic Exercise: 8-22 mins                     Tresa Endo PT Acute Rehabilitation Services Pager [phone] Office [phone]    Claretha Cooper 10/05/2021, 3:38 PM

## 2021-10-06 ENCOUNTER — Other Ambulatory Visit (HOSPITAL_COMMUNITY): Payer: Self-pay

## 2021-10-06 LAB — CBC
HCT: 30.2 % — ABNORMAL LOW (ref 36.0–46.0)
Hemoglobin: 9.7 g/dL — ABNORMAL LOW (ref 12.0–15.0)
MCH: 27.8 pg (ref 26.0–34.0)
MCHC: 32.1 g/dL (ref 30.0–36.0)
MCV: 86.5 fL (ref 80.0–100.0)
Platelets: 272 10*3/uL (ref 150–400)
RBC: 3.49 MIL/uL — ABNORMAL LOW (ref 3.87–5.11)
RDW: 14.7 % (ref 11.5–15.5)
WBC: 6.2 10*3/uL (ref 4.0–10.5)
nRBC: 0 % (ref 0.0–0.2)

## 2021-10-06 LAB — GLUCOSE, CAPILLARY: Glucose-Capillary: 122 mg/dL — ABNORMAL HIGH (ref 70–99)

## 2021-10-06 MED ORDER — HYDROCODONE-ACETAMINOPHEN 5-325 MG PO TABS
1.0000 | ORAL_TABLET | Freq: Four times a day (QID) | ORAL | 0 refills | Status: DC | PRN
Start: 2021-10-06 — End: 2022-02-18

## 2021-10-06 MED ORDER — DAPTOMYCIN IV (FOR PTA / DISCHARGE USE ONLY): 500.0000 mg | INTRAVENOUS | 0 refills | Status: DC

## 2021-10-06 MED ORDER — TIZANIDINE HCL 4 MG PO TABS: 4.0000 mg | ORAL_TABLET | Freq: Four times a day (QID) | ORAL | 1 refills | Status: DC | PRN

## 2021-10-06 MED ORDER — ASPIRIN EC 81 MG PO TBEC: 81.0000 mg | DELAYED_RELEASE_TABLET | Freq: Two times a day (BID) | ORAL | 0 refills | Status: DC

## 2021-10-06 NOTE — Progress Notes (Signed)
Received report. Assuming care of patient at this time.

## 2021-10-06 NOTE — Progress Notes (Signed)
Patient expressed concerns to this RN about being discharged to home.  She stated that she wouldn't have anyone available to help care for her during the day and would prefer to be discharged to a rehab facility.  I asked that she relay her concerns to the MD/PA in order to pursue that course of action.

## 2021-10-06 NOTE — Progress Notes (Signed)
\       Subjective: Pain is better in her knee this morning.   Antibiotics:  Anti-infectives (From admission, onward)    Start     Dose/Rate Route Frequency Ordered Stop   10/06/21 0000  daptomycin (CUBICIN) IVPB        500 mg Intravenous Every 24 hours 10/06/21 0810 11/14/21 2359   10/03/21 1545  DAPTOmycin (CUBICIN) 500 mg in sodium chloride 0.9 % IVPB        500 mg 120 mL/hr over 30 Minutes Intravenous Daily 10/03/21 1451     10/03/21 0800  ceFAZolin (ANCEF) IVPB 2g/100 mL premix        2 g 200 mL/hr over 30 Minutes Intravenous On call to O.R. 10/03/21 0748 10/03/21 1028       Medications: Scheduled Meds:  sodium chloride   Intravenous Once   amLODipine  5 mg Oral Daily   aspirin  81 mg Oral BID   budesonide  0.25 mg Nebulization BID   busPIRone  5 mg Oral BID   Chlorhexidine Gluconate Cloth  6 each Topical Daily   diltiazem  180 mg Oral Daily   docusate sodium  100 mg Oral BID   FLUoxetine  10 mg Oral BID   insulin aspart  0-15 Units Subcutaneous TID WC   irbesartan  300 mg Oral Daily   memantine  10 mg Oral BID   metFORMIN  1,000 mg Oral Q breakfast   pantoprazole  80 mg Oral Q1200   pravastatin  40 mg Oral Daily   primidone  250 mg Oral BID   sodium chloride flush  10-40 mL Intracatheter Q12H   Continuous Infusions:  sodium chloride 10 mL/hr at 10/05/21 0931   DAPTOmycin (CUBICIN)  IV 500 mg (10/05/21 1626)   lactated ringers 50 mL/hr at 10/03/21 1450   methocarbamol (ROBAXIN) IV     PRN Meds:.sodium chloride, acetaminophen, albuterol, alum & mag hydroxide-simeth, bisacodyl, diphenhydrAMINE, HYDROcodone-acetaminophen, HYDROcodone-acetaminophen, menthol-cetylpyridinium **OR** phenol, methocarbamol **OR** methocarbamol (ROBAXIN) IV, metoCLOPramide **OR** metoCLOPramide (REGLAN) injection, morphine injection, ondansetron **OR** ondansetron (ZOFRAN) IV, sodium chloride flush    Objective: Weight change:   Intake/Output Summary (Last 24 hours) at  10/06/2021 0834 Last data filed at 10/06/2021 0600 Gross per 24 hour  Intake 1399.58 ml  Output 650 ml  Net 749.58 ml    Blood pressure 137/63, pulse 88, temperature 98.8 F (37.1 C), temperature source Oral, resp. rate 16, height 5' (1.524 m), weight 66.6 kg, SpO2 97 %. Temp:  [98 F (36.7 C)-98.8 F (37.1 C)] 98.8 F (37.1 C) (12/23 0448) Pulse Rate:  [88-98] 88 (12/23 0448) Resp:  [16-18] 16 (12/23 0448) BP: (137-178)/(63-85) 137/63 (12/23 0448) SpO2:  [97 %-98 %] 97 % (12/23 0448)  Physical Exam: Physical Exam Constitutional:      General: She is not in acute distress.    Appearance: She is well-developed. She is not diaphoretic.  HENT:     Head: Normocephalic and atraumatic.     Right Ear: External ear normal.     Left Ear: External ear normal.     Mouth/Throat:     Pharynx: No oropharyngeal exudate.  Eyes:     General: No scleral icterus.    Conjunctiva/sclera: Conjunctivae normal.     Pupils: Pupils are equal, round, and reactive to light.  Cardiovascular:     Rate and Rhythm: Normal rate and regular rhythm.  Pulmonary:     Effort: Pulmonary effort is normal. No respiratory distress.  Breath sounds: Normal breath sounds. No wheezing.  Abdominal:     General: There is no distension.     Palpations: Abdomen is soft.  Musculoskeletal:        General: No tenderness.  Lymphadenopathy:     Cervical: No cervical adenopathy.  Skin:    General: Skin is warm and dry.     Coloration: Skin is not pale.     Findings: No erythema or rash.  Neurological:     General: No focal deficit present.     Mental Status: She is alert and oriented to person, place, and time.     Motor: No abnormal muscle tone.     Coordination: Coordination normal.  Psychiatric:        Mood and Affect: Mood normal.        Behavior: Behavior normal.        Thought Content: Thought content normal.        Judgment: Judgment normal.    Knee bandaged  CBC:    BMET Recent Labs     10/04/21 0407  NA 136  K 4.5  CL 105  CO2 25  GLUCOSE 146*  BUN 28*  CREATININE 1.04*  CALCIUM 8.2*      Liver Panel  No results for input(s): PROT, ALBUMIN, AST, ALT, ALKPHOS, BILITOT, BILIDIR, IBILI in the last 72 hours.     Sedimentation Rate Recent Labs    10/04/21 0407  ESRSEDRATE 85*    C-Reactive Protein Recent Labs    10/04/21 0407  CRP 7.6*     Micro Results: Recent Results (from the past 720 hour(s))  Surgical pcr screen     Status: None   Collection Time: 09/25/21 11:47 AM   Specimen: Nasal Mucosa; Nasal Swab  Result Value Ref Range Status   MRSA, PCR NEGATIVE NEGATIVE Final   Staphylococcus aureus NEGATIVE NEGATIVE Final    Comment: (NOTE) The Xpert SA Assay (FDA approved for NASAL specimens in patients 3 years of age and older), is one component of a comprehensive surveillance program. It is not intended to diagnose infection nor to guide or monitor treatment. Performed at [facility], [place] [address], [place], [place] [address]   SARS Coronavirus 2 (TAT 6-24 hrs)     Status: None   Collection Time: 09/29/21 12:00 AM  Result Value Ref Range Status   SARS Coronavirus 2 RESULT: NEGATIVE  Final    Comment: RESULT: NEGATIVESARS-CoV-2 INTERPRETATION:A NEGATIVE  test result means that SARS-CoV-2 RNA was not present in the specimen above the limit of detection of this test. This does not preclude a possible SARS-CoV-2 infection and should not be used as the  sole basis for patient management decisions. Negative results must be combined with clinical observations, patient history, and epidemiological information. Optimum specimen types and timing for peak viral levels during infections caused by SARS-CoV-2  have not been determined. Collection of multiple specimens or types of specimens may be necessary to detect virus. Improper specimen collection and handling, sequence variability under primers/probes, or organism present below the  limit of detection may  lead to false negative results. Positive and negative predictive values of testing are highly dependent on prevalence. False negative test results are more likely when prevalence of disease is high.The expected result is NEGATIVE.Fact S heet for  Healthcare Providers: LocalChronicle.no Sheet for Patients: SalonLookup.es Reference Range - Negative   Aerobic/Anaerobic Culture w Gram Stain (surgical/deep wound)     Status: None (Preliminary result)   Collection  Time: 10/03/21 10:38 AM   Specimen: PATH Cytology Misc. fluid; Body Fluid  Result Value Ref Range Status   Specimen Description   Final    SYNOVIAL LT KNEE Performed at [facility], [place] [address], [place], [place] [address]    Special Requests   Final    NONE Performed at [facility], [place] [address], [place], [place] [address]    Gram Stain   Final    FEW WBC PRESENT, PREDOMINANTLY MONONUCLEAR RARE GRAM POSITIVE COCCI    Culture   Final    NO GROWTH 2 DAYS Performed at [facility], [place] [address], [place], [place] [address]    Report Status PENDING  Incomplete    Studies/Results: No results found.    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  #1  Prosthetic joint infection  Fortunately not still isolating organism yet  Therefore go with daptomycin for 6 weeks followed by an oral  regimen possibly doxycycline and cefadroxil to cover staphylococcal species and get her through at least 6 months of therapy  #2 psoriatic arthritis: Again should stay away from immunosuppressive drugs.  Diagnosis: PJI  Culture Result: Pending  Allergies  Allergen Reactions   Lisinopril Cough    OPAT Orders Discharge antibiotics to be given via PICC line Discharge antibiotics: Daptomycin 500 mg every 24 hours   Duration: 6 weeks  End Date:  1/390/2023  [facility] Care Per Protocol:  Home health RN for IV administration and teaching; PICC line care and labs.    Labs weekly while on IV antibiotics: __x CBC with differential _x_ BMP  _x_ CRP _x_ ESR  _x_ CK  _x_ Please pull PIC at completion of IV antibiotics __ Please leave PIC in place until doctor has seen patient or been notified  Fax weekly labs to [phone]  Clinic Follow Up Appt:    Carrie [name] has an appointment on 11/07/2021 with Dr. Candiss Norse at  [facility] for Infectious Disease is located in the [facility] at  [address] in [place].  [address], which is located to the left of the elevators.  Phone: [phone]  Fax: [phone]  [email or web address]  She should arrive 30 minutes prior to her appointment.  Is minor same she is being discharged today.  She has follow-up appointment with [place].   LOS: 3 days   Alcide Evener 10/06/2021, 8:34 AM

## 2021-10-06 NOTE — Progress Notes (Signed)
Subjective: 3 Days Post-Op Procedure(s) (LRB): LEFT TOTAL KNEE REVISION POLY SWAP WITH IRRIGATION AND DEBRIDEMENT (Left)  I spoke with patient yesterday afternoon and she did not feel ready to go home. She is feeling better today and pain is better controlled. She is hoping to go home today.  Activity level:  wbat Diet tolerance:  ok Voiding:  ok Patient reports pain as mild.    Objective: Vital signs in last 24 hours: Temp:  [98 F (36.7 C)-98.8 F (37.1 C)] 98.8 F (37.1 C) (12/23 0448) Pulse Rate:  [88-98] 88 (12/23 0448) Resp:  [16-18] 16 (12/23 0448) BP: (137-178)/(63-85) 137/63 (12/23 0448) SpO2:  [96 %-98 %] 97 % (12/23 0448)  Labs: Recent Labs    10/04/21 0407 10/05/21 0354 10/06/21 0405  HGB 8.1* 9.4* 9.7*   Recent Labs    10/05/21 0354 10/06/21 0405  WBC 6.9 6.2  RBC 3.36* 3.49*  HCT 29.1* 30.2*  PLT 223 272   Recent Labs    10/04/21 0407  NA 136  K 4.5  CL 105  CO2 25  BUN 28*  CREATININE 1.04*  GLUCOSE 146*  CALCIUM 8.2*   Recent Labs    10/03/21 0832  INR 1.1    Physical Exam:  Neurologically intact ABD soft Neurovascular intact Sensation intact distally Intact pulses distally Dorsiflexion/Plantar flexion intact Incision: dressing C/D/I and no drainage No cellulitis present Compartment soft  Assessment/Plan:  3 Days Post-Op Procedure(s) (LRB): LEFT TOTAL KNEE REVISION POLY SWAP WITH IRRIGATION AND DEBRIDEMENT (Left) Advance diet Up with therapy Discharge home with home health today if cleared by PT and ID. We greatly appreciate ID management. We discussed SNF but the patient and I both agreed that she should go home.  She will be on 16m asa BID for 2 weeks for DVT prevention. Follow up in office 2 weeks post op. She will leave her aquacel bandage on until follow up but can remove the ace wrap and gauze part of her bandage on Sunday.   ALarwance SachsNida 10/06/2021, 7:58 AM

## 2021-10-06 NOTE — Plan of Care (Signed)
Plan of care reviewed and discussed with the patient. 

## 2021-10-06 NOTE — Discharge Summary (Signed)
Patient ID: Carrie [name] MRN: [id number] DOB/AGE: 1956-04-20 65 y.o.  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, initial encounter [facility])   Discharge Diagnoses:  Same  Past Medical History:  Diagnosis Date   Anxiety    Arthritis    psoriatic arithritis, DDD    Asthma    Depression    Diabetes mellitus    Gastritis    Gastroparesis    GERD (gastroesophageal reflux disease)    Hernia    Hyperlipemia    Hyperlipemia    Hypertension    Liver cirrhosis ([place])    Neuropathy    Bil feet re: to Diabetes   Obesity    Osteoporosis    Plantar fasciitis    Stricture and stenosis of esophagus    TIA (transient ischemic attack)    8-10 yrs ago, no problems since   Tuberculosis    tested positive 2011, no symptoms, was on medicine for 6 months    Surgeries: Procedure(s): LEFT TOTAL KNEE REVISION POLY SWAP WITH IRRIGATION AND DEBRIDEMENT on 10/03/2021   Consultants:   Discharged Condition: Improved  Hospital Course: Carrie [name] is an 65 y.o. female who was admitted 10/03/2021 for operative treatment of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, initial encounter ([place]). Patient has severe unremitting pain that affects sleep, daily activities, and work/hobbies. After pre-op clearance the patient was taken to the operating room on 10/03/2021 and underwent  Procedure(s): LEFT TOTAL KNEE REVISION POLY SWAP WITH IRRIGATION AND DEBRIDEMENT.    Patient was given perioperative antibiotics:  Anti-infectives (From admission, onward)    Start     Dose/Rate Route Frequency Ordered Stop   10/06/21 0000  daptomycin (CUBICIN) IVPB        500 mg Intravenous Every 24 hours 10/06/21 0810 11/14/21 2359   10/03/21 1545  DAPTOmycin (CUBICIN) 500 mg in sodium chloride 0.9 % IVPB        500 mg 120 mL/hr over 30 Minutes Intravenous Daily  10/03/21 1451     10/03/21 0800  ceFAZolin (ANCEF) IVPB 2g/100 mL premix        2 g 200 mL/hr over 30 Minutes Intravenous On call to O.R. 10/03/21 0748 10/03/21 1028        Patient was given sequential compression devices, early ambulation, and chemoprophylaxis to prevent DVT.  Post op day 1 her Hgb was low and she was given a unit of blood. She responded well to this.  Patient benefited maximally from hospital stay and there were no complications.    Recent vital signs: Patient Vitals for the past 24 hrs:  BP Temp Temp src Pulse Resp SpO2  10/06/21 0448 137/63 98.8 F (37.1 C) Oral 88 16 97 %  10/05/21 2120 (!) 178/85 98 F (36.7 C) Oral 98 18 98 %  10/05/21 2101 -- -- -- -- -- 97 %  10/05/21 1257 (!) 166/81 -- -- 90 18 98 %  10/05/21 0829 -- -- -- -- -- 96 %     Recent laboratory studies:  Recent Labs    10/03/21 0832 10/04/21 0407 10/04/21 0407 10/05/21 0354 10/06/21 0405  WBC  --  8.3   < > 6.9 6.2  HGB  --  8.1*   < > 9.4* 9.7*  HCT  --  24.9*   < > 29.1* 30.2*  PLT  --  244   < > 223 272  NA  --  136  --   --   --   K  --  4.5  --   --   --   CL  --  105  --   --   --   CO2  --  25  --   --   --   BUN  --  28*  --   --   --   CREATININE  --  1.04*  --   --   --   GLUCOSE  --  146*  --   --   --   INR 1.1  --   --   --   --   CALCIUM  --  8.2*  --   --   --    < > = values in this interval not displayed.     Discharge Medications:   Allergies as of 10/06/2021       Reactions   Lisinopril Cough        Medication List     TAKE these medications    Accu-Chek Guide Me w/Device Kit Use as instructed to check blood sugar 2 times daily Dx E11.9   Accu-Chek Guide test strip Generic drug: glucose blood Use as instructed to check blood sugar 2 times daily Dx E11.9   Accu-Chek Softclix Lancets lancets Use as instructed to check blood sugar 2 times daily Dx E11.9   acetaminophen 650 MG CR tablet Commonly known as: TYLENOL Take 1,300 mg by mouth  every 8 (eight) hours as needed for pain.   albuterol 108 (90 Base) MCG/ACT inhaler Commonly known as: VENTOLIN HFA Inhale 2 puffs into the lungs every 4 (four) hours as needed for wheezing or shortness of breath.   amLODipine 5 MG tablet Commonly known as: NORVASC Take 1 tablet (5 mg total) by mouth daily.   aspirin EC 81 MG tablet Take 1 tablet (81 mg total) by mouth 2 (two) times daily after a meal. What changed: when to take this   B-12 2500 MCG Tabs Take 2,500 mcg by mouth daily.   B-D SINGLE USE SWABS REGULAR Pads 1 each by Does not apply route 2 (two) times daily. Cleanse site BID for glucose testing; E11.9   busPIRone 5 MG tablet Commonly known as: BUSPAR Take 5 mg by mouth 2 (two) times daily.   Calcium Carbonate-Vitamin D 600-400 MG-UNIT tablet Take 1 tablet by mouth daily.   clobetasol cream 0.05 % Commonly known as: TEMOVATE Apply 1 application topically 2 (two) times daily as needed (dry skin).   daptomycin  IVPB Commonly known as: CUBICIN Inject 500 mg into the vein daily. Indication:  Left knee PJI First Dose: Yes Last Day of Therapy:  11/13/21 Labs - Once weekly:  CBC/D, BMP, and CPK Labs - Every other week:  ESR and CRP Method of administration: IV Push Method of administration may be changed at the discretion of home infusion pharmacist based upon assessment of the patient and/or caregiver's ability to self-administer the medication ordered.   diltiazem 180 MG 24 hr capsule Commonly known as: CARDIZEM CD Take 180 mg by mouth daily.   esomeprazole 40 MG capsule Commonly known as: NEXIUM Take 20 mg by mouth every morning.   Flovent HFA 110 MCG/ACT inhaler Generic drug: fluticasone Inhale 1 puff into the lungs daily.   FLUoxetine 10 MG capsule Commonly known as: PROZAC Take 10 mg by mouth in the morning and at bedtime.   HYDROcodone-acetaminophen 5-325 MG tablet  Commonly known as: NORCO/VICODIN Take 1-2 tablets by mouth every 6 (six) hours as  needed for moderate pain (pain score 4-6).   lactulose 10 GM/15ML solution Commonly known as: CHRONULAC Take 15 mLs (10 g total) by mouth daily.   memantine 10 MG tablet Commonly known as: NAMENDA Take 10 mg by mouth 2 (two) times daily.   metFORMIN 500 MG 24 hr tablet Commonly known as: GLUCOPHAGE-XR Take 2 tablets (1,000 mg total) by mouth daily.   olmesartan 40 MG tablet Commonly known as: BENICAR Take 1 tablet (40 mg total) by mouth daily. Please hold this medication, repeat kidney function at your pcp 's office , pcp to decide on when to resume olmesartan.   ondansetron 4 MG disintegrating tablet Commonly known as: Zofran ODT Take 1 tablet (4 mg total) by mouth every 8 (eight) hours as needed for nausea.   Ozempic (1 MG/DOSE) 4 MG/3ML Sopn Generic drug: Semaglutide (1 MG/DOSE) Inject 1 mg as directed once a week.   pravastatin 40 MG tablet Commonly known as: PRAVACHOL Take 40 mg by mouth daily.   primidone 250 MG tablet Commonly known as: MYSOLINE Take 250 mg by mouth in the morning and at bedtime.   tiZANidine 4 MG tablet Commonly known as: Zanaflex Take 1 tablet (4 mg total) by mouth every 6 (six) hours as needed for muscle spasms.   Tremfya 100 MG/ML Sopn Generic drug: Guselkumab Inject 100 mg into the skin every 30 (thirty) days.   Vitamin D (Ergocalciferol) 1.25 MG (50000 UNIT) Caps capsule Commonly known as: DRISDOL Take 50,000 Units by mouth every 7 (seven) days.               Durable Medical Equipment  (From admission, onward)           Start     Ordered   10/03/21 1332  DME Walker rolling  Once       Question:  Patient needs a walker to treat with the following condition  Answer:  Left knee pain   10/03/21 1331   10/03/21 1332  DME 3 n 1  Once        10/03/21 1331   10/03/21 1332  DME Bedside commode  Once       Question:  Patient needs a bedside commode to treat with the following condition  Answer:  Left knee pain   10/03/21 1331               Discharge Care Instructions  (From admission, onward)           Start     Ordered   10/06/21 0000  Change dressing on IV access line weekly and PRN  (Home infusion instructions - [facility] )        10/06/21 0810            Diagnostic Studies: [place] EKG SITE RITE  Result Date: 10/03/2021 If Site Rite image not attached, placement could not be confirmed due to current cardiac rhythm.   Disposition: Discharge disposition: 01-Home or Self Care       Discharge Instructions     [facility] pharmacist to adjust dose for Vancomycin, Aminoglycosides and other anti-infective therapies as requested by physician.   Complete by: As directed    [facility] to provide Cath Flo 36m   Complete by: As directed    Administer for PICC line occlusion and as ordered by physician for other access device issues.   Anaphylaxis  Kit: Provided to treat any anaphylactic reaction to the medication being provided to the patient if First Dose or when requested by physician   Complete by: As directed    Epinephrine 8m/ml vial / amp: Administer 0.370m(0.9m55msubcutaneously once for moderate to severe anaphylaxis, nurse to call physician and pharmacy when reaction occurs and call 911 if needed for immediate care   Diphenhydramine 23m35m IV vial: Administer 25-23mg39mIM PRN for first dose reaction, rash, itching, mild reaction, nurse to call physician and pharmacy when reaction occurs   Sodium Chloride 0.9% NS 500ml 49mAdminister if needed for hypovolemic blood pressure drop or as ordered by physician after call to physician with anaphylactic reaction   Call MD / Call 911   Complete by: As directed    If you experience chest pain or shortness of breath, CALL 911 and be transported to the hospital emergency room.  If you develope a fever above 101 F, pus (white drainage) or increased drainage or redness at the wound, or calf pain, call your surgeon's  office.   Change dressing on IV access line weekly and PRN   Complete by: As directed    Constipation Prevention   Complete by: As directed    Drink plenty of fluids.  Prune juice may be helpful.  You may use a stool softener, such as Colace (over the counter) 100 mg twice a day.  Use MiraLax (over the counter) for constipation as needed.   Diet - low sodium heart healthy   Complete by: As directed    Discharge instructions   Complete by: As directed    INSTRUCTIONS AFTER JOINT REPLACEMENT   Remove items at home which could result in a fall. This includes throw rugs or furniture in walking pathways ICE to the affected joint every three hours while awake for 30 minutes at a time, for at least the first 3-5 days, and then as needed for pain and swelling.  Continue to use ice for pain and swelling. You may notice swelling that will progress down to the foot and ankle.  This is normal after surgery.  Elevate your leg when you are not up walking on it.   Continue to use the breathing machine you got in the hospital (incentive spirometer) which will help keep your temperature down.  It is common for your temperature to cycle up and down following surgery, especially at night when you are not up moving around and exerting yourself.  The breathing machine keeps your lungs expanded and your temperature down.   DIET:  As you were doing prior to hospitalization, we recommend a well-balanced diet.  DRESSING / WOUND CARE / SHOWERING  You may shower 3 days after surgery, but keep the wounds dry during showering.  You may use an occlusive plastic wrap (Press'n Seal for example), NO SOAKING/SUBMERGING IN THE BATHTUB.  If the bandage gets wet, change with a clean dry gauze.  If the incision gets wet, pat the wound dry with a clean towel.  ACTIVITY  Increase activity slowly as tolerated, but follow the weight bearing instructions below.   No driving for 6 weeks or until further direction given by your  physician.  You cannot drive while taking narcotics.  No lifting or carrying greater than 10 lbs. until further directed by your surgeon. Avoid periods of inactivity such as sitting longer than an hour when not asleep. This helps prevent blood clots.  You may return to work once you are authorized by  your doctor.     WEIGHT BEARING   Weight bearing as tolerated with assist device (walker, cane, etc) as directed, use it as long as suggested by your surgeon or therapist, typically at least 4-6 weeks.   EXERCISES  Results after joint replacement surgery are often greatly improved when you follow the exercise, range of motion and muscle strengthening exercises prescribed by your doctor. Safety measures are also important to protect the joint from further injury. Any time any of these exercises cause you to have increased pain or swelling, decrease what you are doing until you are comfortable again and then slowly increase them. If you have problems or questions, call your caregiver or physical therapist for advice.   Rehabilitation is important following a joint replacement. After just a few days of immobilization, the muscles of the leg can become weakened and shrink (atrophy).  These exercises are designed to build up the tone and strength of the thigh and leg muscles and to improve motion. Often times heat used for twenty to thirty minutes before working out will loosen up your tissues and help with improving the range of motion but do not use heat for the first two weeks following surgery (sometimes heat can increase post-operative swelling).   These exercises can be done on a training (exercise) mat, on the floor, on a table or on a bed. Use whatever works the best and is most comfortable for you.    Use music or television while you are exercising so that the exercises are a pleasant break in your day. This will make your life better with the exercises acting as a break in your routine that you  can look forward to.   Perform all exercises about fifteen times, three times per day or as directed.  You should exercise both the operative leg and the other leg as well.  Exercises include:   Quad Sets - Tighten up the muscle on the front of the thigh (Quad) and hold for 5-10 seconds.   Straight Leg Raises - With your knee straight (if you were given a brace, keep it on), lift the leg to 60 degrees, hold for 3 seconds, and slowly lower the leg.  Perform this exercise against resistance later as your leg gets stronger.  Leg Slides: Lying on your back, slowly slide your foot toward your buttocks, bending your knee up off the floor (only go as far as is comfortable). Then slowly slide your foot back down until your leg is flat on the floor again.  Angel Wings: Lying on your back spread your legs to the side as far apart as you can without causing discomfort.  Hamstring Strength:  Lying on your back, push your heel against the floor with your leg straight by tightening up the muscles of your buttocks.  Repeat, but this time bend your knee to a comfortable angle, and push your heel against the floor.  You may put a pillow under the heel to make it more comfortable if necessary.   A rehabilitation program following joint replacement surgery can speed recovery and prevent re-injury in the future due to weakened muscles. Contact your doctor or a physical therapist for more information on knee rehabilitation.    CONSTIPATION  Constipation is defined medically as fewer than three stools per week and severe constipation as less than one stool per week.  Even if you have a regular bowel pattern at home, your normal regimen is likely to be disrupted due to  multiple reasons following surgery.  Combination of anesthesia, postoperative narcotics, change in appetite and fluid intake all can affect your bowels.   YOU MUST use at least one of the following options; they are listed in order of increasing strength to  get the job done.  They are all available over the counter, and you may need to use some, POSSIBLY even all of these options:    Drink plenty of fluids (prune juice may be helpful) and high fiber foods Colace 100 mg by mouth twice a day  Senokot for constipation as directed and as needed Dulcolax (bisacodyl), take with full glass of water  Miralax (polyethylene glycol) once or twice a day as needed.  If you have tried all these things and are unable to have a bowel movement in the first 3-4 days after surgery call either your surgeon or your primary doctor.    If you experience loose stools or diarrhea, hold the medications until you stool forms back up.  If your symptoms do not get better within 1 week or if they get worse, check with your doctor.  If you experience "the worst abdominal pain ever" or develop nausea or vomiting, please contact the office immediately for further recommendations for treatment.   ITCHING:  If you experience itching with your medications, try taking only a single pain pill, or even half a pain pill at a time.  You can also use Benadryl over the counter for itching or also to help with sleep.   TED HOSE STOCKINGS:  Use stockings on both legs until for at least 2 weeks or as directed by physician office. They may be removed at night for sleeping.  MEDICATIONS:  See your medication summary on the "After Visit Summary" that nursing will review with you.  You may have some home medications which will be placed on hold until you complete the course of blood thinner medication.  It is important for you to complete the blood thinner medication as prescribed.  PRECAUTIONS:  If you experience chest pain or shortness of breath - call 911 immediately for transfer to the hospital emergency department.   If you develop a fever greater that 101 F, purulent drainage from wound, increased redness or drainage from wound, foul odor from the wound/dressing, or calf pain - CONTACT YOUR  SURGEON.                                                   FOLLOW-UP APPOINTMENTS:  If you do not already have a post-op appointment, please call the office for an appointment to be seen by your surgeon.  Guidelines for how soon to be seen are listed in your "After Visit Summary", but are typically between 1-4 weeks after surgery.  OTHER INSTRUCTIONS:   Knee Replacement:  Do not place pillow under knee, focus on keeping the knee straight while resting. CPM instructions: 0-90 degrees, 2 hours in the morning, 2 hours in the afternoon, and 2 hours in the evening. Place foam block, curve side up under heel at all times except when in CPM or when walking.  DO NOT modify, tear, cut, or change the foam block in any way.  POST-OPERATIVE OPIOID TAPER INSTRUCTIONS: It is important to wean off of your opioid medication as soon as possible. If you do not need pain medication after your  surgery it is ok to stop day one. Opioids include: Codeine, Hydrocodone(Norco, Vicodin), Oxycodone(Percocet, oxycontin) and hydromorphone amongst others.  Long term and even short term use of opiods can cause: Increased pain response Dependence Constipation Depression Respiratory depression And more.  Withdrawal symptoms can include Flu like symptoms Nausea, vomiting And more Techniques to manage these symptoms Hydrate well Eat regular healthy meals Stay active Use relaxation techniques(deep breathing, meditating, yoga) Do Not substitute Alcohol to help with tapering If you have been on opioids for less than two weeks and do not have pain than it is ok to stop all together.  Plan to wean off of opioids This plan should start within one week post op of your joint replacement. Maintain the same interval or time between taking each dose and first decrease the dose.  Cut the total daily intake of opioids by one tablet each day Next start to increase the time between doses. The last dose that should be eliminated is  the evening dose.     MAKE SURE YOU:  Understand these instructions.  Get help right away if you are not doing well or get worse.    Thank you for letting us be a part of your medical care team.  It is a privilege we respect greatly.  We hope these instructions will help you stay on track for a fast and full recovery!   Flush IV access with Sodium Chloride 0.9% and Heparin 10 units/ml or 100 units/ml   Complete by: As directed    Home infusion instructions - [facility]   Complete by: As directed    Instructions: Flush IV access with Sodium Chloride 0.9% and Heparin 10units/ml or 100units/ml   Change dressing on IV access line: Weekly and PRN   Instructions Cath Flo 35m: Administer for PICC Line occlusion and as ordered by physician for other access device   [facility] pharmacist to adjust dose for: Vancomycin, Aminoglycosides and other anti-infective therapies as requested by physician   Increase activity slowly as tolerated   Complete by: As directed    Method of administration may be changed at the discretion of home infusion pharmacist based upon assessment of the patient and/or caregivers ability to self-administer the medication ordered   Complete by: As directed    Post-operative opioid taper instructions:   Complete by: As directed    POST-OPERATIVE OPIOID TAPER INSTRUCTIONS: It is important to wean off of your opioid medication as soon as possible. If you do not need pain medication after your surgery it is ok to stop day one. Opioids include: Codeine, Hydrocodone(Norco, Vicodin), Oxycodone(Percocet, oxycontin) and hydromorphone amongst others.  Long term and even short term use of opiods can cause: Increased pain response Dependence Constipation Depression Respiratory depression And more.  Withdrawal symptoms can include Flu like symptoms Nausea, vomiting And more Techniques to manage these symptoms Hydrate well Eat regular healthy  meals Stay active Use relaxation techniques(deep breathing, meditating, yoga) Do Not substitute Alcohol to help with tapering If you have been on opioids for less than two weeks and do not have pain than it is ok to stop all together.  Plan to wean off of opioids This plan should start within one week post op of your joint replacement. Maintain the same interval or time between taking each dose and first decrease the dose.  Cut the total daily intake of opioids by one tablet each day Next start to increase the time between doses. The last dose that  should be eliminated is the evening dose.           Follow-up Information     Melrose Nakayama, MD Follow up in 2 week(s).   Specialty: Orthopedic Surgery Contact information: [place] [address] [phone]                  Signed: Larwance Sachs Zahari Fazzino 10/06/2021, 8:11 AM

## 2021-10-08 LAB — AEROBIC/ANAEROBIC CULTURE W GRAM STAIN (SURGICAL/DEEP WOUND): Culture: NO GROWTH

## 2021-10-18 ENCOUNTER — Telehealth: Payer: Self-pay | Admitting: Pharmacist

## 2021-10-18 NOTE — Telephone Encounter (Signed)
[facility] called regarding critical lab value for CK elevated at 1548. CK was WNL at 39 in December. Of note, patient also on pravastatin 37m once daily. Receiving 5013monce daily (~7.5 mg/kg) - relatively lower dose already. Please advise on next steps.  Would you like to continue on regimen for now and monitor? Or consider holding pravastatin while on therapy? Could consider administering fluids, or we could consider alternatives such as vancomycin.  Let me know your thoughts. I will contact with order updates.  Thanks!  AmAlfonse SprucePharmD, CPP Clinical Pharmacist Practitioner [facility]

## 2021-10-19 NOTE — Progress Notes (Signed)
Acute blood loss anemia

## 2021-10-19 NOTE — Telephone Encounter (Signed)
Relayed verbal orders to [facility] to discontinue daptomycin and start vancomycin 542m BID through 11/13/21 as well as add on weekly vancomycin troughs. Thank you! AEstill Bamberg

## 2021-10-23 ENCOUNTER — Telehealth: Payer: Self-pay

## 2021-10-23 NOTE — Telephone Encounter (Signed)
Patient calling to get break down of exact bacteria we are treating with the Vanc. Please advise.

## 2021-11-07 ENCOUNTER — Encounter: Payer: Self-pay | Admitting: Infectious Diseases

## 2021-11-07 ENCOUNTER — Other Ambulatory Visit: Payer: Self-pay

## 2021-11-07 ENCOUNTER — Telehealth: Payer: Self-pay

## 2021-11-07 ENCOUNTER — Ambulatory Visit (INDEPENDENT_AMBULATORY_CARE_PROVIDER_SITE_OTHER): Payer: Medicare Other | Admitting: Infectious Diseases

## 2021-11-07 VITALS — BP 179/95 | HR 88 | Temp 97.7°F | Wt 141.0 lb

## 2021-11-07 DIAGNOSIS — T8459XD Infection and inflammatory reaction due to other internal joint prosthesis, subsequent encounter: Secondary | ICD-10-CM

## 2021-11-07 DIAGNOSIS — T8454XD 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, subsequent encounter: Secondary | ICD-10-CM | POA: Diagnosis not present

## 2021-11-07 DIAGNOSIS — L4050 Arthropathic psoriasis, unspecified: Secondary | ICD-10-CM | POA: Insufficient documentation

## 2021-11-07 DIAGNOSIS — Z452 Encounter for adjustment and management of vascular access device: Secondary | ICD-10-CM | POA: Insufficient documentation

## 2021-11-07 DIAGNOSIS — Z5181 Encounter for therapeutic drug level monitoring: Secondary | ICD-10-CM | POA: Insufficient documentation

## 2021-11-07 DIAGNOSIS — Z96652 Presence of left artificial knee joint: Secondary | ICD-10-CM

## 2021-11-07 DIAGNOSIS — T8459XA Infection and inflammatory reaction due to other internal joint prosthesis, initial encounter: Secondary | ICD-10-CM | POA: Insufficient documentation

## 2021-11-07 MED ORDER — DOXYCYCLINE HYCLATE 100 MG PO TABS: 100.0000 mg | ORAL_TABLET | Freq: Two times a day (BID) | ORAL | 5 refills | Status: DC

## 2021-11-07 MED ORDER — CEFADROXIL 500 MG PO CAPS: 500.0000 mg | ORAL_CAPSULE | Freq: Two times a day (BID) | ORAL | 5 refills | Status: DC

## 2021-11-07 NOTE — Progress Notes (Addendum)
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   Psoriasis 07/08/2017   Diabetes ([place]) 02/16/2016   Chest pain 12/04/2013   Diabetes mellitus type II, uncontrolled 12/04/2013   HTN (hypertension) 12/04/2013   Hypoglycemia 12/04/2013   Pyelonephritis 10/10/2013   UTI (lower urinary tract infection) 10/10/2013   HBP (high blood pressure) 04/21/2013   Gastroparesis 02/25/2013   Cough 02/25/2013   Nausea and vomiting 01/06/2013   Nausea alone 05/15/2011   STRICTURE AND STENOSIS OF ESOPHAGUS 10/21/2009   Hyperlipidemia 01/09/2008   OBESITY 01/09/2008   Asthma 01/09/2008   GERD 01/09/2008   HERNIA, VENTRAL 01/09/2008   ARTHRITIS 01/09/2008   Depression 03/25/2007   ANXIETY 03/25/2007    Patient's Medications  New Prescriptions   No medications on file  Previous Medications   ACCU-CHEK SOFTCLIX LANCETS LANCETS    Use as instructed to check blood sugar 2 times daily Dx E11.9   ACETAMINOPHEN (TYLENOL) 650 MG CR TABLET    Take 1,300 mg by mouth every 8 (eight) hours as needed for pain.   ALBUTEROL (VENTOLIN HFA) 108 (90 BASE) MCG/ACT INHALER    Inhale 2 puffs into the lungs every 4 (four) hours as needed for wheezing or shortness of breath.   ALCOHOL SWABS (B-D SINGLE USE SWABS REGULAR) PADS    1 each by Does not apply route 2 (two) times daily. Cleanse site BID for glucose testing; E11.9   AMLODIPINE (NORVASC) 5 MG TABLET    Take 1 tablet (5 mg total) by mouth daily.   ASPIRIN EC 81 MG TABLET    Take 1 tablet (81 mg total) by mouth 2 (two) times daily after a meal.   BLOOD GLUCOSE MONITORING SUPPL (ACCU-CHEK GUIDE ME) W/DEVICE  KIT    Use as instructed to check blood sugar 2 times daily Dx E11.9   BUSPIRONE (BUSPAR) 5 MG TABLET    Take 5 mg by mouth 2 (two) times daily.   CALCIUM CARBONATE-VITAMIN D 600-400 MG-UNIT PER TABLET    Take 1 tablet by mouth daily.   CLOBETASOL CREAM (TEMOVATE) 0.05 %    Apply 1 application topically 2 (two) times daily as needed (dry skin).   CYANOCOBALAMIN (B-12) 2500 MCG TABS    Take 2,500 mcg by mouth daily.   DAPTOMYCIN (CUBICIN) IVPB    Inject 500 mg into the vein daily. Indication:  Left knee PJI First Dose: Yes Last Day of Therapy:  11/13/21 Labs - Once weekly:  CBC/D, BMP, and CPK Labs - Every other week:  ESR and CRP Method of administration: IV Push Method of administration may be changed at the discretion of home infusion pharmacist based upon assessment of the patient and/or caregiver's ability to self-administer the medication ordered.   DILTIAZEM (CARDIZEM CD) 180 MG 24 HR CAPSULE    Take 180 mg by mouth daily.   ESOMEPRAZOLE (NEXIUM) 40 MG CAPSULE    Take 20 mg by mouth every morning.   FLOVENT HFA 110 MCG/ACT INHALER    Inhale 1 puff into the lungs daily.   FLUOXETINE (PROZAC) 10 MG CAPSULE    Take 10 mg by mouth in the morning and at  bedtime.   GLUCOSE BLOOD (ACCU-CHEK GUIDE) TEST STRIP    Use as instructed to check blood sugar 2 times daily Dx E11.9   HYDROCODONE-ACETAMINOPHEN (NORCO/VICODIN) 5-325 MG TABLET    Take 1-2 tablets by mouth every 6 (six) hours as needed for moderate pain (pain score 4-6).   LACTULOSE (CHRONULAC) 10 GM/15ML SOLUTION    Take 15 mLs (10 g total) by mouth daily.   MEMANTINE (NAMENDA) 10 MG TABLET    Take 10 mg by mouth 2 (two) times daily.   METFORMIN (GLUCOPHAGE-XR) 500 MG 24 HR TABLET    Take 2 tablets (1,000 mg total) by mouth daily.   OLMESARTAN (BENICAR) 40 MG TABLET    Take 1 tablet (40 mg total) by mouth daily. Please hold this medication, repeat kidney function at your pcp 's office , pcp to decide on when to resume olmesartan.    ONDANSETRON (ZOFRAN ODT) 4 MG DISINTEGRATING TABLET    Take 1 tablet (4 mg total) by mouth every 8 (eight) hours as needed for nausea.   PRAVASTATIN (PRAVACHOL) 40 MG TABLET    Take 40 mg by mouth daily.   PRIMIDONE (MYSOLINE) 250 MG TABLET    Take 250 mg by mouth in the morning and at bedtime.    SEMAGLUTIDE, 1 MG/DOSE, (OZEMPIC, 1 MG/DOSE,) 4 MG/3ML SOPN    Inject 1 mg as directed once a week.   TIZANIDINE (ZANAFLEX) 4 MG TABLET    Take 1 tablet (4 mg total) by mouth every 6 (six) hours as needed for muscle spasms.   TREMFYA 100 MG/ML SOPN    Inject 100 mg into the skin every 30 (thirty) days.   VITAMIN D, ERGOCALCIFEROL, (DRISDOL) 1.25 MG (50000 UNIT) CAPS CAPSULE    Take 50,000 Units by mouth every 7 (seven) days.  Modified Medications   No medications on file  Discontinued Medications   No medications on file    Subjective: 66 year old female with PMH of psoriatic arthritis, cirrhosis, DJD, Bilateral Knee Replacements with revision arthroplasty of left knee , Obesity, Osteoporosis, asthma, TIA, Latent TB treated, depression, DM, GERD/gastritis, hyperlipidemia/hypertension,  bilateral foot neuropathy,  who is here for HFU in the setting of PJI of left knee.  Admitted in 03/2021 for pyelonephritis. 04/10/21 Urine cx with Kleb pneumo. Blood cx no growth.   Admitted in the hospital 12/19-12/23/23 for worsening left knee pain with fevers, nausea and malaise. She had several serial aspirations of her left knee joint done prior to the admit  with abnormally high WBC for prosthetic knee joint ( no records available and have been requested). All cultures were also negative, however, a synovial assay was positive for staph species ( unidentified species). Underwent Left knee I and D/revision with polyswap on 12/20. Gram stain positive for GPC but no growth on cultures. Patient was started on IV daptomycin and discharged on 10/06/21 with 6 weeks course of dapto though PICC.   Interim - Daptomycin  switched to Vancomycin in the setting of elevated CPK in 10/19/21.   Today - Patient is accompanied by her friend. Pain in the left knee is 2/10. Denies any tenderness of swelling. She is able to ambulate but needs some time to walk after sitting. Denies fevers, chills. Denies nausea, vomiting and diarrhea. She had questions regarding the actual pathogen causing the infection. She denies being on meds for Psoriasis currently.  Review of Systems: ROS Negative for fevers, chills Negative for nausea, vomiting and diarrhea All other systems reviewed and are negative except  as above  Past Medical History:  Diagnosis Date   Anxiety    Arthritis    psoriatic arithritis, DDD    Asthma    Depression    Diabetes mellitus    Gastritis    Gastroparesis    GERD (gastroesophageal reflux disease)    Hernia    Hyperlipemia    Hyperlipemia    Hypertension    Liver cirrhosis ([place])    Neuropathy    Bil feet re: to Diabetes   Obesity    Osteoporosis    Plantar fasciitis    Stricture and stenosis of esophagus    TIA (transient ischemic attack)    8-10 yrs ago, no problems since   Tuberculosis    tested positive 2011, no symptoms, was on medicine for 6 months   Past Surgical History:  Procedure Laterality Date   BREAST SURGERY Bilateral    biopsies both breast   CARDIAC CATHETERIZATION     CESAREAN SECTION     x 2    CHOLECYSTECTOMY     COLONOSCOPY     FOOT SURGERY Left    spur removal   HERNIA REPAIR     IR FLUORO GUIDE CV LINE RIGHT  04/12/2021   IR US GUIDE VASC ACCESS RIGHT  04/12/2021   JOINT REPLACEMENT     bilat knee    KNEE SURGERY Bilateral    x 2 joint knee replacements   TOTAL KNEE REVISION Left 10/03/2021   Procedure: LEFT TOTAL KNEE REVISION POLY SWAP WITH IRRIGATION AND DEBRIDEMENT;  Surgeon: Melrose Nakayama, MD;  Location: [facility];  Service: Orthopedics;  Laterality: Left;   UPPER GASTROINTESTINAL ENDOSCOPY     WISDOM TOOTH EXTRACTION     WISDOM TOOTH EXTRACTION       Social History   Tobacco Use   Smoking status: Never   Smokeless tobacco: Never  Vaping Use   Vaping Use: Never used  Substance Use Topics   Alcohol use: Not Currently    Comment: socially   Drug use: No    Family History  Problem Relation Age of Onset   Diabetes Other        maternal aunts and uncles   Diabetes Father    Liver disease Father    Emphysema Father        smoked   Diabetes Sister    Diabetes Brother    Sarcoidosis Brother    Emphysema Mother        smoked   Asthma Mother    Asthma Brother    Colon cancer Neg Hx    Esophageal cancer Neg Hx    Stomach cancer Neg Hx    Rectal cancer Neg Hx     Allergies  Allergen Reactions   Lisinopril Cough    Health Maintenance  Topic Date Due   COVID-19 Vaccine (1) Never done   Zoster Vaccines- Shingrix (1 of 2) Never done   PAP SMEAR-Modifier  Never done   MAMMOGRAM  08/11/2012   OPHTHALMOLOGY EXAM  11/02/2017   FOOT EXAM  03/16/2022   HEMOGLOBIN A1C  03/22/2022   COLONOSCOPY (Pts 45-32yr Insurance coverage will need to be confirmed)  11/28/2023   TETANUS/TDAP  11/02/2025   INFLUENZA VACCINE  Completed   Hepatitis C Screening  Completed   HIV Screening  Completed   HPV VACCINES  Aged Out    Objective: BP (!) 179/95    Pulse 88    Temp 97.7 F (36.5 C) (Oral)    Wt  141 lb (64 kg)    LMP  (LMP Unknown)    SpO2 96%    BMI 27.54 kg/m '   Physical Exam Constitutional:      Appearance: Normal appearance.  HENT:     Head: Normocephalic and atraumatic.      Mouth: Mucous membranes are moist.  Eyes:    Conjunctiva/sclera: Conjunctivae normal.     Pupils: Pupils are equal, round Cardiovascular:     Rate and Rhythm: Normal rate and regular rhythm.     Heart sounds:   Pulmonary:     Effort: Pulmonary effort is normal.     Breath sounds: Normal breath sounds.   Abdominal:     General:    Palpations: Abdomen is soft.   Musculoskeletal:      Bilateral TKR  Left TKR - no erythema  /tenderness/swelling, has reasonable ROM in left knee  Skin:    General: Skin is warm and dry.     Comments:  Neurological:     General: No focal deficit present.     Mental Status: She is alert and oriented to person, place, and time.   Psychiatric:        Mood and Affect: Mood normal.   Lab Results Lab Results  Component Value Date   WBC 6.2 10/06/2021   HGB 9.7 (L) 10/06/2021   HCT 30.2 (L) 10/06/2021   MCV 86.5 10/06/2021   PLT 272 10/06/2021    Lab Results  Component Value Date   CREATININE 1.04 (H) 10/04/2021   BUN 28 (H) 10/04/2021   NA 136 10/04/2021   K 4.5 10/04/2021   CL 105 10/04/2021   CO2 25 10/04/2021    Lab Results  Component Value Date   ALT 12 09/25/2021   AST 15 09/25/2021   ALKPHOS 66 09/25/2021   BILITOT 0.3 09/25/2021    Lab Results  Component Value Date   CHOL 188 09/30/2015   HDL 25 (L) 09/30/2015   LDLCALC 116 (H) 09/30/2015   TRIG 235 (H) 09/30/2015   CHOLHDL 7.5 09/30/2015   No results found for: LABRPR, RPRTITER No results found for: HIV1RNAQUANT, HIV1RNAVL, CD4TABS   Problem List Items Addressed This Visit       Musculoskeletal and Integument   Infected prosthetic knee joint ([place]) - Primary   Relevant Medications   vancomycin (VANCOCIN) 10 G SOLR injection   cefadroxil (DURICEF) 500 MG capsule   Psoriatic arthritis ([place])     Other   Medication monitoring encounter   PICC (peripherally inserted central catheter) in place    Assessment/Plan  Left Knee PJI ( staph spp)  Medication Monitoring 10/31/21 ESR 95, CRP 87  PICC - no issues  Psoriatic arthritis - denies being on biologics, follows Rheumatology   Continue Vancomycin, pharmacy to dose. End date 11/13/21 PICC line to be removed after last dose on 11/13/21 Start Doxycycline and cefadroxil from 11/14/21 given no positive cultures from OR to cover broadly. Plan for 6 months total  Obtain prior left knee synovial fluid analysis and cultures from Orthopedics [facility] in 4  weeks  [facility] with Ortho as instructed   I have personally spent 52 minutes involved in face-to-face and non-face-to-face activities for this patient on the day of the visit. Professional time spent includes the following activities: Preparing to see the patient (review of tests), Obtaining and/or reviewing separately obtained history (admission/discharge record), Performing a medically appropriate examination and/or evaluation , Ordering medications/tests/procedures, referring and communicating with other health care professionals, Documenting  clinical information in the EMR, Independently interpreting results (not separately reported), Communicating results to the patient/family/caregiver, Counseling and educating the patient/family/caregiver and Care coordination (not separately reported).   Wilber Oliphant, [place] [facility] 11/07/2021, 10:09 AM

## 2021-11-07 NOTE — Telephone Encounter (Signed)
Per MD called [facility] with verbal order to pull picc after last dose. Spoke with Jeani Hawking, Pharmacist who was able to take verbal order.  Order was repeated and confirmed before ending call. Leatrice Jewels, RMA

## 2021-11-08 NOTE — Telephone Encounter (Signed)
Thank you Cece!

## 2021-11-15 ENCOUNTER — Telehealth: Payer: Self-pay

## 2021-11-15 NOTE — Telephone Encounter (Signed)
Patient called requesting a letter for her rheumatologist (Dr. Ardeen Jourdain) to restart a new medication for her psoriatic arthritis. Dr. Posey Rea wants to be sure it is ok for her to take with her oral antibiotics that Dr. West Bali is now prescribing. Patient stated she is unsure what the new medication will be and isurance no longer covers the one she was previously on that was stopped while she was on IV antibiotics. Letter will need to be faxed to [facility]ology Attn Dr. Posey Rea [phone] [place] [address] [place]

## 2021-11-15 NOTE — Telephone Encounter (Signed)
Called Dr. Jenetta Downer office to follow up on message for letter. Per RN Dr. Posey Rea is waiting on insurance before starting patient on any medication. Will discuss plan of care during appt on 2/15.  Patient might be starting Orson Ape, will know for sure during next appt. Leatrice Jewels, RMA

## 2021-11-24 ENCOUNTER — Telehealth: Payer: Self-pay

## 2021-11-24 NOTE — Telephone Encounter (Signed)
Patient complains of elevated BP (177/97) since start of oral abx. Also having severe nausea and headache. Has not called PCP. Please advise on any recommendations or changes needed. Will call patient back at [phone].

## 2021-11-24 NOTE — Telephone Encounter (Signed)
Advised patient to contact PCP as Dr West Bali does not feel this is related to her oral antibiotics.

## 2021-11-29 ENCOUNTER — Other Ambulatory Visit: Payer: Self-pay

## 2021-11-29 ENCOUNTER — Telehealth: Payer: Self-pay

## 2021-11-29 MED ORDER — ONDANSETRON HCL 4 MG PO TABS: 4.0000 mg | ORAL_TABLET | Freq: Three times a day (TID) | ORAL | 0 refills | Status: DC | PRN

## 2021-11-29 NOTE — Telephone Encounter (Signed)
Mychart message sent to patient.

## 2021-11-29 NOTE — Telephone Encounter (Signed)
Patient called stating that she stopped taking antibiotics due to having severe nausea and headaches. Symptoms have now improved since stopping antibiotics. Patient contacted Rheumatoid arthritis doctor due joint pain and wanting to be put back on Cosentyx. RA doctor stated that she needed to reach to Dr.Manandhar to see if she is going to be put back on antibiotics. Patient follow up on appointment on 2/24. Please advise.    [place], CMA

## 2021-12-08 ENCOUNTER — Ambulatory Visit: Payer: Medicare Other | Admitting: Infectious Diseases

## 2021-12-08 ENCOUNTER — Other Ambulatory Visit: Payer: Self-pay

## 2021-12-08 ENCOUNTER — Encounter: Payer: Self-pay | Admitting: Infectious Diseases

## 2021-12-08 ENCOUNTER — Ambulatory Visit (INDEPENDENT_AMBULATORY_CARE_PROVIDER_SITE_OTHER): Payer: Medicare Other | Admitting: Infectious Diseases

## 2021-12-08 VITALS — BP 137/75 | HR 88 | Temp 98.2°F | Ht 60.0 in | Wt 137.0 lb

## 2021-12-08 DIAGNOSIS — T8459XD Infection and inflammatory reaction due to other internal joint prosthesis, subsequent encounter: Secondary | ICD-10-CM | POA: Diagnosis not present

## 2021-12-08 DIAGNOSIS — L4050 Arthropathic psoriasis, unspecified: Secondary | ICD-10-CM | POA: Diagnosis not present

## 2021-12-08 DIAGNOSIS — Z5181 Encounter for therapeutic drug level monitoring: Secondary | ICD-10-CM

## 2021-12-08 DIAGNOSIS — Z96659 Presence of unspecified artificial knee joint: Secondary | ICD-10-CM

## 2021-12-08 MED ORDER — AMOXICILLIN-POT CLAVULANATE 875-125 MG PO TABS: 1.0000 | ORAL_TABLET | Freq: Two times a day (BID) | ORAL | 1 refills | Status: DC

## 2021-12-08 NOTE — Progress Notes (Signed)
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   Psoriasis 07/08/2017   Diabetes ([place]) 02/16/2016   Chest pain 12/04/2013   Diabetes mellitus type II, uncontrolled 12/04/2013   HTN (hypertension) 12/04/2013   Hypoglycemia 12/04/2013   Pyelonephritis 10/10/2013   UTI (lower urinary tract infection) 10/10/2013   HBP (high blood pressure) 04/21/2013   Gastroparesis 02/25/2013   Cough 02/25/2013   Nausea and vomiting 01/06/2013   Nausea alone 05/15/2011   STRICTURE AND STENOSIS OF ESOPHAGUS 10/21/2009   Hyperlipidemia 01/09/2008   OBESITY 01/09/2008   Asthma 01/09/2008   GERD 01/09/2008   HERNIA, VENTRAL 01/09/2008   ARTHRITIS 01/09/2008   Depression 03/25/2007   ANXIETY 03/25/2007    Patient's Medications  New Prescriptions   No medications on file  Previous Medications   ACCU-CHEK SOFTCLIX LANCETS LANCETS    Use as instructed to check blood sugar 2 times daily Dx E11.9   ACETAMINOPHEN (TYLENOL) 650 MG CR TABLET    Take 1,300 mg by mouth every 8 (eight) hours as needed for pain.   ALBUTEROL (VENTOLIN HFA) 108 (90 BASE) MCG/ACT INHALER    Inhale 2 puffs into the lungs every 4 (four) hours as needed for wheezing or shortness of breath.   ALCOHOL SWABS (B-D SINGLE USE SWABS REGULAR) PADS    1 each by Does not apply route 2 (two) times daily. Cleanse site BID for glucose testing; E11.9   AMLODIPINE (NORVASC) 5 MG TABLET    Take 1  tablet (5 mg total) by mouth daily.   ASPIRIN EC 81 MG TABLET    Take 1 tablet (81 mg total) by mouth 2 (two) times daily after a meal.   BLOOD GLUCOSE MONITORING SUPPL (ACCU-CHEK GUIDE ME) W/DEVICE KIT    Use as instructed to check blood sugar 2 times daily Dx E11.9   BUSPIRONE (BUSPAR) 5 MG TABLET    Take 5 mg by mouth 2 (two) times daily.   CALCIUM CARBONATE-VITAMIN D 600-400 MG-UNIT PER TABLET    Take 1 tablet by mouth daily.   CEFADROXIL (DURICEF) 500 MG CAPSULE    Take 1 capsule (500 mg total) by mouth 2 (two) times daily.   CLOBETASOL CREAM (TEMOVATE) 0.05 %    Apply 1 application topically 2 (two) times daily as needed (dry skin).   CYANOCOBALAMIN (B-12) 2500 MCG TABS    Take 2,500 mcg by mouth daily.   DILTIAZEM (CARDIZEM CD) 180 MG 24 HR CAPSULE    Take 180 mg by mouth daily.   DOXYCYCLINE (VIBRA-TABS) 100 MG TABLET    Take 1 tablet (100 mg total) by mouth 2 (two) times daily.   ESOMEPRAZOLE (NEXIUM) 40 MG CAPSULE    Take 20 mg by mouth every morning.   FLOVENT HFA 110 MCG/ACT INHALER    Inhale 1 puff into the lungs daily.   FLUOXETINE (PROZAC) 10 MG CAPSULE    Take 10 mg by mouth in the morning and at bedtime.   GLUCOSE BLOOD (ACCU-CHEK GUIDE)  TEST STRIP    Use as instructed to check blood sugar 2 times daily Dx E11.9   HYDROCODONE-ACETAMINOPHEN (NORCO/VICODIN) 5-325 MG TABLET    Take 1-2 tablets by mouth every 6 (six) hours as needed for moderate pain (pain score 4-6).   LACTULOSE (CHRONULAC) 10 GM/15ML SOLUTION    Take 15 mLs (10 g total) by mouth daily.   MEMANTINE (NAMENDA) 10 MG TABLET    Take 10 mg by mouth 2 (two) times daily.   METFORMIN (GLUCOPHAGE-XR) 500 MG 24 HR TABLET    Take 2 tablets (1,000 mg total) by mouth daily.   OLMESARTAN (BENICAR) 40 MG TABLET    Take 1 tablet (40 mg total) by mouth daily. Please hold this medication, repeat kidney function at your pcp 's office , pcp to decide on when to resume olmesartan.   ONDANSETRON (ZOFRAN) 4 MG TABLET    Take 1 tablet (4 mg  total) by mouth every 8 (eight) hours as needed for nausea or vomiting.   PRAVASTATIN (PRAVACHOL) 40 MG TABLET    Take 40 mg by mouth daily.   PRIMIDONE (MYSOLINE) 250 MG TABLET    Take 250 mg by mouth in the morning and at bedtime.    SEMAGLUTIDE, 1 MG/DOSE, (OZEMPIC, 1 MG/DOSE,) 4 MG/3ML SOPN    Inject 1 mg as directed once a week.   TIZANIDINE (ZANAFLEX) 4 MG TABLET    Take 1 tablet (4 mg total) by mouth every 6 (six) hours as needed for muscle spasms.   TREMFYA 100 MG/ML SOPN    Inject 100 mg into the skin every 30 (thirty) days.   VANCOMYCIN (VANCOCIN) 10 G SOLR INJECTION    Inject into the vein.   VITAMIN D, ERGOCALCIFEROL, (DRISDOL) 1.25 MG (50000 UNIT) CAPS CAPSULE    Take 50,000 Units by mouth every 7 (seven) days.  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 After last seen in 1/24, I had prescribed her Doxycycline and cefadroxil which she was unable to tolerate. She was feeling poorly with low energy and stomach upset after which I told her to stop taking the cefadroxil and continue taking the doxycycline only. She has been taking Doxycycline only since 2/10 and had no issues so far. She also needs to start cosentyx for her Psoriatic arthritis as he joint pain are getting worse. Discussed with her about the potential risks of infection with the medicine and the decision to start would be a based on her  risk vs benefit ratio.   Review of Systems: ROS all systems reviewed and negative except as above  Past Medical History:  Diagnosis Date   Anxiety    Arthritis    psoriatic arithritis, DDD    Asthma    Depression    Diabetes mellitus    Gastritis    Gastroparesis    GERD (gastroesophageal reflux disease)    Hernia    Hyperlipemia    Hyperlipemia    Hypertension    Liver cirrhosis ([place])    Neuropathy    Bil feet re: to Diabetes   Obesity    Osteoporosis     Plantar fasciitis    Stricture and stenosis of esophagus    TIA (transient ischemic attack)    8-10 yrs ago, no problems since   Tuberculosis    tested positive 2011, no symptoms, was on medicine for 6  months   Past Surgical History:  Procedure Laterality Date   BREAST SURGERY Bilateral    biopsies both breast   CARDIAC CATHETERIZATION     CESAREAN SECTION     x 2    CHOLECYSTECTOMY     COLONOSCOPY     FOOT SURGERY Left    spur removal   HERNIA REPAIR     IR FLUORO GUIDE CV LINE RIGHT  04/12/2021   IR US GUIDE VASC ACCESS RIGHT  04/12/2021   JOINT REPLACEMENT     bilat knee    KNEE SURGERY Bilateral    x 2 joint knee replacements   TOTAL KNEE REVISION Left 10/03/2021   Procedure: LEFT TOTAL KNEE REVISION POLY SWAP WITH IRRIGATION AND DEBRIDEMENT;  Surgeon: Melrose Nakayama, MD;  Location: [facility];  Service: Orthopedics;  Laterality: Left;   UPPER GASTROINTESTINAL ENDOSCOPY     WISDOM TOOTH EXTRACTION     WISDOM TOOTH EXTRACTION      Social History   Tobacco Use   Smoking status: Never   Smokeless tobacco: Never  Vaping Use   Vaping Use: Never used  Substance Use Topics   Alcohol use: Not Currently    Comment: socially   Drug use: No    Family History  Problem Relation Age of Onset   Diabetes Other        maternal aunts and uncles   Diabetes Father    Liver disease Father    Emphysema Father        smoked   Diabetes Sister    Diabetes Brother    Sarcoidosis Brother    Emphysema Mother        smoked   Asthma Mother    Asthma Brother    Colon cancer Neg Hx    Esophageal cancer Neg Hx    Stomach cancer Neg Hx    Rectal cancer Neg Hx     Allergies  Allergen Reactions   Lisinopril Cough    Health Maintenance  Topic Date Due   COVID-19 Vaccine (1) Never done   Zoster Vaccines- Shingrix (1 of 2) Never done   PAP SMEAR-Modifier  Never done   MAMMOGRAM  08/11/2012   OPHTHALMOLOGY EXAM  11/02/2017   FOOT EXAM  03/16/2022   HEMOGLOBIN A1C  03/22/2022    COLONOSCOPY (Pts 45-25yr Insurance coverage will need to be confirmed)  11/28/2023   TETANUS/TDAP  11/02/2025   INFLUENZA VACCINE  Completed   Hepatitis C Screening  Completed   HIV Screening  Completed   HPV VACCINES  Aged Out    Objective: BP 137/75    Pulse 88    Temp 98.2 F (36.8 C) (Oral)    Ht 5' (1.524 m)    Wt 137 lb (62.1 kg)    LMP  (LMP Unknown)    SpO2 98%    BMI 26.76 kg/m    Physical Exam Constitutional:      Appearance: Normal appearance.  HENT:     Head: Normocephalic and atraumatic.      Mouth: Mucous membranes are moist.  Eyes:    Conjunctiva/sclera: Conjunctivae normal.     Pupils: Pupils are equal, round  Cardiovascular:     Rate and Rhythm: Normal rate and regular rhythm.     Heart sounds:  Pulmonary:     Effort: Pulmonary effort is normal.     Breath sounds:  Abdominal:     General: Non distended     Palpations: soft.   Musculoskeletal:  General: Normal range of motion.   Skin:    General: Skin is warm and dry.     Comments:  Neurological:     General: grossly non focal     Mental Status: awake, alert and oriented to person, place, and time.   Psychiatric:        Mood and Affect: Mood normal.   Lab Results Lab Results  Component Value Date   WBC 6.2 10/06/2021   HGB 9.7 (L) 10/06/2021   HCT 30.2 (L) 10/06/2021   MCV 86.5 10/06/2021   PLT 272 10/06/2021    Lab Results  Component Value Date   CREATININE 1.04 (H) 10/04/2021   BUN 28 (H) 10/04/2021   NA 136 10/04/2021   K 4.5 10/04/2021   CL 105 10/04/2021   CO2 25 10/04/2021    Lab Results  Component Value Date   ALT 12 09/25/2021   AST 15 09/25/2021   ALKPHOS 66 09/25/2021   BILITOT 0.3 09/25/2021    Lab Results  Component Value Date   CHOL 188 09/30/2015   HDL 25 (L) 09/30/2015   LDLCALC 116 (H) 09/30/2015   TRIG 235 (H) 09/30/2015   CHOLHDL 7.5 09/30/2015   No results found for: LABRPR, RPRTITER No results found for: HIV1RNAQUANT, HIV1RNAVL, CD4TABS    Problem List Items Addressed This Visit       Musculoskeletal and Integument   Infected prosthetic knee joint ([place])   Psoriatic arthritis ([place])     Other   Medication monitoring encounter - Primary   Relevant Orders   CBC (Completed)   C-reactive protein (Completed)   Sedimentation rate (Completed)   Basic metabolic panel (Completed)   # Left Knee PJI  # Medication Monitoring # Psoriatic arthritis  Continue doxycyline Will add augmentin 875/125 mg and see if she tolerates ( both are BID regimen) Labs today She will follow up with Rheumatologist for Psoriatic arthritis. Starting Cosentyx would be based on risk vs benefit ratio understanding potential flare of infection on tx.   I have personally spent 40 minutes involved in face-to-face and non-face-to-face activities for this patient on the day of the visit including staff time, coordination of care and counseling of the patient.   Wilber Oliphant, [facility] 12/08/2021, 3:20 PM

## 2021-12-09 LAB — CBC
HCT: 31.1 % — ABNORMAL LOW (ref 35.0–45.0)
Hemoglobin: 9.9 g/dL — ABNORMAL LOW (ref 11.7–15.5)
MCH: 24.8 pg — ABNORMAL LOW (ref 27.0–33.0)
MCHC: 31.8 g/dL — ABNORMAL LOW (ref 32.0–36.0)
MCV: 77.8 fL — ABNORMAL LOW (ref 80.0–100.0)
MPV: 12.1 fL (ref 7.5–12.5)
Platelets: 412 10*3/uL — ABNORMAL HIGH (ref 140–400)
RBC: 4 10*6/uL (ref 3.80–5.10)
RDW: 14.5 % (ref 11.0–15.0)
WBC: 8.2 10*3/uL (ref 3.8–10.8)

## 2021-12-09 LAB — BASIC METABOLIC PANEL
BUN/Creatinine Ratio: 18 (calc) (ref 6–22)
BUN: 27 mg/dL — ABNORMAL HIGH (ref 7–25)
CO2: 24 mmol/L (ref 20–32)
Calcium: 10 mg/dL (ref 8.6–10.4)
Chloride: 105 mmol/L (ref 98–110)
Creat: 1.53 mg/dL — ABNORMAL HIGH (ref 0.50–1.05)
Glucose, Bld: 129 mg/dL — ABNORMAL HIGH (ref 65–99)
Potassium: 4.8 mmol/L (ref 3.5–5.3)
Sodium: 140 mmol/L (ref 135–146)

## 2021-12-09 LAB — SEDIMENTATION RATE: Sed Rate: 97 mm/h — ABNORMAL HIGH (ref 0–30)

## 2021-12-09 LAB — C-REACTIVE PROTEIN: CRP: 7.8 mg/L (ref <8.0)

## 2021-12-11 ENCOUNTER — Telehealth: Payer: Self-pay

## 2021-12-11 NOTE — Telephone Encounter (Signed)
-----   Message from Rosiland Oz, MD sent at 12/10/2021  5:55 PM EST ----- Let her know her CRP is normal.  Cr is slightly elevated. Please have her come for lab visit in 2-3 weeks for BMP. Thanks

## 2021-12-11 NOTE — Telephone Encounter (Signed)
Called patient to relay results and schedule for lab appointment, no answer and unable to leave voicemail.  Beryle Flock, RN

## 2021-12-12 NOTE — Telephone Encounter (Signed)
Patient returned call, relayed that her CRP is normal, but that creatinine is slightly elevated and that we would like to recheck this. She says she thinks it was high because she was taking a lot of ibuprofen, but has since been switched to tramadol by her rheumatologist. She says she has an appointment with her nephrologist the first week of April and would prefer to wait and have labs rechecked then.   Beryle Flock, RN

## 2021-12-24 ENCOUNTER — Other Ambulatory Visit: Payer: Self-pay | Admitting: Endocrinology

## 2022-01-16 ENCOUNTER — Ambulatory Visit (INDEPENDENT_AMBULATORY_CARE_PROVIDER_SITE_OTHER): Payer: Medicare Other | Admitting: Endocrinology

## 2022-01-16 ENCOUNTER — Encounter: Payer: Self-pay | Admitting: Endocrinology

## 2022-01-16 VITALS — BP 154/88 | Ht 62.0 in | Wt 130.0 lb

## 2022-01-16 DIAGNOSIS — E1142 Type 2 diabetes mellitus with diabetic polyneuropathy: Secondary | ICD-10-CM | POA: Diagnosis not present

## 2022-01-16 DIAGNOSIS — Z794 Long term (current) use of insulin: Secondary | ICD-10-CM

## 2022-01-16 LAB — POCT GLYCOSYLATED HEMOGLOBIN (HGB A1C): Hemoglobin A1C: 5.8 % — AB (ref 4.0–5.6)

## 2022-01-16 MED ORDER — METFORMIN HCL ER 500 MG PO TB24: 500.0000 mg | ORAL_TABLET | Freq: Every day | ORAL | 3 refills | Status: DC

## 2022-01-16 MED ORDER — OZEMPIC (1 MG/DOSE) 4 MG/3ML ~~LOC~~ SOPN: 1.0000 mg | PEN_INJECTOR | SUBCUTANEOUS | 3 refills | Status: DC

## 2022-01-16 NOTE — Patient Instructions (Addendum)
Your blood pressure is high today.  Please see your primary care provider soon, to have it rechecked.   ?I have sent a prescription to your pharmacy, to reduce the metformin.   ?Please continue the same Ozempic.   ?check your blood sugar once a day.  vary the time of day when you check, between before the 3 meals, and at bedtime.  also check if you have symptoms of your blood sugar being too high or too low.  please keep a record of the readings and bring it to your next appointment here (or you can bring the meter itself).  You can write it on any piece of paper.  please call us sooner if your blood sugar goes below 70, or if you have a lot of readings over 200.   ?Please come back for a follow-up appointment in 6 months.   ?

## 2022-01-16 NOTE — Progress Notes (Signed)
? ?Subjective:  ? ? Patient ID: Carrie [name], female    DOB: 06-11-1956, 66 y.o.   MRN: [id number] ? ?HPI ?Pt returns for f/u of diabetes mellitus: ?DM type: 2 ?Dx'ed: 2010. ?Complications: PN, CRI, and GP.   ?Therapy: metformin and Ozempic.   ?GDM: 2000.  ?DKA: never. ?Severe hypoglycemia: never.  ?Pancreatitis: never.  ?SDOH: she was on multiple daily injections, but could not remember to take.  ?Other: she did not tolerate invokana (vaginitis); psoriasis raises possibility she is evolving type 1 DM; edema limits rx options.  She took insulin 2011-2121.  ?Interval history: She says she never misses meds. No recent steroids.  Pt says cbg varies from 60-190.   ?Past Medical History:  ?Diagnosis Date  ? Anxiety   ? Arthritis   ? psoriatic arithritis, DDD   ? Asthma   ? Depression   ? Diabetes mellitus   ? Gastritis   ? Gastroparesis   ? GERD (gastroesophageal reflux disease)   ? Hernia   ? Hyperlipemia   ? Hyperlipemia   ? Hypertension   ? Liver cirrhosis ([place])   ? Neuropathy   ? 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 Obesity   ? Osteoporosis   ? Plantar fasciitis   ? Stricture and stenosis of esophagus   ? TIA (transient ischemic attack)   ? 8-10 yrs ago, no problems since  ? Tuberculosis   ? tested positive 2011, no symptoms, was on medicine for 6 months  ? ? ?Past Surgical History:  ?Procedure Laterality Date  ? BREAST SURGERY Bilateral   ? biopsies both breast  ? CARDIAC CATHETERIZATION    ? CESAREAN SECTION    ? x 2   ? CHOLECYSTECTOMY    ? COLONOSCOPY    ? FOOT SURGERY Left   ? spur removal  ? HERNIA REPAIR    ? IR FLUORO GUIDE CV LINE RIGHT  04/12/2021  ? IR US GUIDE VASC ACCESS RIGHT  04/12/2021  ? JOINT REPLACEMENT    ? bilat knee   ? KNEE SURGERY Bilateral   ? x 2 joint knee replacements  ? TOTAL KNEE REVISION Left 10/03/2021  ? Procedure: LEFT TOTAL KNEE REVISION POLY SWAP WITH IRRIGATION AND DEBRIDEMENT;  Surgeon: Melrose Nakayama, MD;  Location: [facility];  Service: Orthopedics;  Laterality: Left;  ? UPPER  GASTROINTESTINAL ENDOSCOPY    ? WISDOM TOOTH EXTRACTION    ? WISDOM TOOTH EXTRACTION    ? ? ?Social History  ? ?Socioeconomic History  ? Marital status: Single  ?  Spouse name: Not on file  ? Number of children: 2  ? Years of education: Not on file  ? Highest education level: Not on file  ?Occupational History  ? Occupation: Disabled   ?Tobacco Use  ? Smoking status: Never  ? Smokeless tobacco: Never  ?Vaping Use  ? Vaping Use: Never used  ?Substance and Sexual Activity  ? Alcohol use: Not Currently  ?  Comment: socially  ? Drug use: No  ? Sexual activity: Not Currently  ?  Birth control/protection: Post-menopausal  ?Other Topics Concern  ? Not on file  ?Social History Narrative  ? [place] daily   ? ?Social Determinants of Health  ? ?Financial Resource Strain: Not on file  ?Food Insecurity: Not on file  ?Transportation Needs: Not on file  ?Physical Activity: Not on file  ?Stress: Not on file  ?Social Connections: Not on file  ?Intimate Partner Violence: Not on file  ? ? ?Current Outpatient  Medications on File Prior to Visit  ?Medication Sig Dispense Refill  ? Accu-Chek Softclix Lancets lancets Use as instructed to check blood sugar 2 times daily Dx E11.9 200 each 0  ? acetaminophen (TYLENOL) 650 MG CR tablet Take 1,300 mg by mouth every 8 (eight) hours as needed for pain.    ? albuterol (VENTOLIN HFA) 108 (90 Base) MCG/ACT inhaler Inhale 2 puffs into the lungs every 4 (four) hours as needed for wheezing or shortness of breath.    ? Alcohol Swabs (B-D SINGLE USE SWABS REGULAR) PADS 1 each by Does not apply route 2 (two) times daily. Cleanse site BID for glucose testing; E11.9 200 each 0  ? amLODipine (NORVASC) 5 MG tablet Take 1 tablet (5 mg total) by mouth daily. 30 tablet 0  ? amoxicillin-clavulanate (AUGMENTIN) 875-125 MG tablet Take 1 tablet by mouth 2 (two) times daily. 60 tablet 1  ? aspirin EC 81 MG tablet Take 1 tablet (81 mg total) by mouth 2 (two) times daily after a meal. 30 tablet 0  ? Blood Glucose  Monitoring Suppl (ACCU-CHEK GUIDE ME) w/Device KIT Use as instructed to check blood sugar 2 times daily Dx E11.9 1 kit 0  ? busPIRone (BUSPAR) 5 MG tablet Take 5 mg by mouth 2 (two) times daily.    ? Calcium Carbonate-Vitamin D 600-400 MG-UNIT per tablet Take 1 tablet by mouth daily.    ? clobetasol cream (TEMOVATE) 3.26 % Apply 1 application topically 2 (two) times daily as needed (dry skin).    ? Cyanocobalamin (B-12) 2500 MCG TABS Take 2,500 mcg by mouth daily.    ? diltiazem (CARDIZEM CD) 180 MG 24 hr capsule Take 180 mg by mouth daily.    ? doxycycline (VIBRA-TABS) 100 MG tablet Take 1 tablet (100 mg total) by mouth 2 (two) times daily. 60 tablet 5  ? esomeprazole (NEXIUM) 40 MG capsule Take 20 mg by mouth every morning.    ? FLOVENT HFA 110 MCG/ACT inhaler Inhale 1 puff into the lungs daily.    ? FLUoxetine (PROZAC) 10 MG capsule Take 10 mg by mouth in the morning and at bedtime.    ? glucose blood (ACCU-CHEK GUIDE) test strip USE AS INSTRUCTED TO CHECK BLOOD SUGAR 2 TIMES DAILY 100 strip 5  ? HYDROcodone-acetaminophen (NORCO/VICODIN) 5-325 MG tablet Take 1-2 tablets by mouth every 6 (six) hours as needed for moderate pain (pain score 4-6). 30 tablet 0  ? lactulose (CHRONULAC) 10 GM/15ML solution Take 15 mLs (10 g total) by mouth daily. 236 mL 0  ? memantine (NAMENDA) 10 MG tablet Take 10 mg by mouth 2 (two) times daily.    ? olmesartan (BENICAR) 40 MG tablet Take 1 tablet (40 mg total) by mouth daily. Please hold this medication, repeat kidney function at your pcp 's office , pcp to decide on when to resume olmesartan.    ? ondansetron (ZOFRAN) 4 MG tablet Take 1 tablet (4 mg total) by mouth every 8 (eight) hours as needed for nausea or vomiting. 15 tablet 0  ? pravastatin (PRAVACHOL) 40 MG tablet Take 40 mg by mouth daily.    ? primidone (MYSOLINE) 250 MG tablet Take 250 mg by mouth in the morning and at bedtime.     ? tiZANidine (ZANAFLEX) 4 MG tablet Take 1 tablet (4 mg total) by mouth every 6 (six) hours  as needed for muscle spasms. 40 tablet 1  ? topiramate (TOPAMAX) 25 MG tablet Take 25 mg by mouth daily.    ?  TREMFYA 100 MG/ML SOPN Inject 100 mg into the skin every 30 (thirty) days.    ? Vitamin D, Ergocalciferol, (DRISDOL) 1.25 MG (50000 UNIT) CAPS capsule Take 50,000 Units by mouth every 7 (seven) days. (Patient not taking: Reported on 12/08/2021)    ? ?No current facility-administered medications on file prior to visit.  ? ? ?Allergies  ?Allergen Reactions  ? Lisinopril Cough  ? ? ?Family History  ?Problem Relation Age of Onset  ? Diabetes Other   ?     maternal aunts and uncles  ? Diabetes Father   ? Liver disease Father   ? Emphysema Father   ?     smoked  ? Diabetes Sister   ? Diabetes Brother   ? Sarcoidosis Brother   ? Emphysema Mother   ?     smoked  ? Asthma Mother   ? Asthma Brother   ? Colon cancer Neg Hx   ? Esophageal cancer Neg Hx   ? Stomach cancer Neg Hx   ? Rectal cancer Neg Hx   ? ? ?BP (!) 154/88   Ht _0  (1.575 m)   Wt 130 lb (59 kg)   LMP  (LMP Unknown)   BMI 23.78 kg/m?  ? ? ?Review of Systems ?Denies N/V/HB.   ?   ?Objective:  ? Physical Exam ?VITAL SIGNS:  See vs page.   ?GENERAL: no distress.   ? ?Lab Results  ?Component Value Date  ? CREATININE 1.53 (H) 12/08/2021  ? BUN 27 (H) 12/08/2021  ? NA 140 12/08/2021  ? K 4.8 12/08/2021  ? CL 105 12/08/2021  ? CO2 24 12/08/2021  ? ? ?A1c=5.8% ?   ?Assessment & Plan:  ?type 2 DM.   ?CRI: she needs to reduce metformin.  ? ?Patient Instructions  ?Your blood pressure is high today.  Please see your primary care provider soon, to have it rechecked.   ?I have sent a prescription to your pharmacy, to reduce the metformin.   ?Please continue the same Ozempic.   ?check your blood sugar once a day.  vary the time of day when you check, between before the 3 meals, and at bedtime.  also check if you have symptoms of your blood sugar being too high or too low.  please keep a record of the readings and bring it to your next appointment here (or you can  bring the meter itself).  You can write it on any piece of paper.  please call us sooner if your blood sugar goes below 70, or if you have a lot of readings over 200.   ?Please come back for a follow-up appointment in 6 mo

## 2022-01-30 ENCOUNTER — Other Ambulatory Visit: Payer: Self-pay

## 2022-01-30 ENCOUNTER — Encounter: Payer: Self-pay | Admitting: Infectious Diseases

## 2022-01-30 ENCOUNTER — Ambulatory Visit (INDEPENDENT_AMBULATORY_CARE_PROVIDER_SITE_OTHER): Payer: Medicare Other | Admitting: Infectious Diseases

## 2022-01-30 VITALS — BP 164/97 | HR 86 | Temp 98.2°F | Wt 131.0 lb

## 2022-01-30 DIAGNOSIS — L4050 Arthropathic psoriasis, unspecified: Secondary | ICD-10-CM

## 2022-01-30 DIAGNOSIS — T8453XD Infection and inflammatory reaction due to internal right knee prosthesis, subsequent encounter: Secondary | ICD-10-CM

## 2022-01-30 DIAGNOSIS — K7460 Unspecified cirrhosis of liver: Secondary | ICD-10-CM

## 2022-01-30 DIAGNOSIS — Z5181 Encounter for therapeutic drug level monitoring: Secondary | ICD-10-CM | POA: Diagnosis not present

## 2022-01-30 DIAGNOSIS — T8459XD Infection and inflammatory reaction due to other internal joint prosthesis, subsequent encounter: Secondary | ICD-10-CM

## 2022-01-30 MED ORDER — AMOXICILLIN 500 MG PO CAPS: 500.0000 mg | ORAL_CAPSULE | Freq: Three times a day (TID) | ORAL | 3 refills | Status: DC

## 2022-01-30 NOTE — Progress Notes (Addendum)
? ?  ? ?Patient Active Problem List  ? Diagnosis Date Noted  ? 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 Medication monitoring encounter 11/07/2021  ? PICC (peripherally inserted central catheter) in place 11/07/2021  ? Psoriatic arthritis ([place]) 11/07/2021  ? 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, initial encounter ([place]) 10/03/2021  ? Pressure injury of skin 04/13/2021  ? Bowel obstruction ([place]) 02/08/2020  ? Leukocytosis 02/08/2020  ? 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 Psoriasis 07/08/2017  ? Diabetes ([place]) 02/16/2016  ? Chest pain 12/04/2013  ? Diabetes mellitus type II, uncontrolled 12/04/2013  ? HTN (hypertension) 12/04/2013  ? Hypoglycemia 12/04/2013  ? Pyelonephritis 10/10/2013  ? UTI (lower urinary tract infection) 10/10/2013  ? HBP (high blood pressure) 04/21/2013  ? Gastroparesis 02/25/2013  ? Cough 02/25/2013  ? Nausea and vomiting 01/06/2013  ? Nausea alone 05/15/2011  ? STRICTURE AND STENOSIS OF ESOPHAGUS 10/21/2009  ? Hyperlipidemia 01/09/2008  ? OBESITY 01/09/2008  ? Asthma 01/09/2008  ? GERD 01/09/2008  ? HERNIA, VENTRAL 01/09/2008  ? ARTHRITIS 01/09/2008  ? Depression 03/25/2007  ? ANXIETY 03/25/2007  ? ?Current Outpatient Medications on File Prior to Visit  ?Medication Sig Dispense Refill  ? Accu-Chek Softclix Lancets lancets Use as instructed to check blood sugar 2 times daily Dx E11.9 200 each 0  ? acetaminophen (TYLENOL) 650 MG CR tablet Take 1,300 mg by mouth every 8 (eight) hours as needed for pain.    ? albuterol (VENTOLIN HFA) 108 (90 Base) MCG/ACT inhaler Inhale 2 puffs into the lungs every 4 (four) hours as needed for wheezing or shortness of breath.    ? Alcohol Swabs (B-D SINGLE USE SWABS REGULAR) PADS 1 each by Does not apply route 2 (two) times daily. Cleanse site BID for glucose testing; E11.9 200 each 0  ? amLODipine (NORVASC) 5 MG tablet  Take 1 tablet (5 mg total) by mouth daily. 30 tablet 0  ? aspirin EC 81 MG tablet Take 1 tablet (81 mg total) by mouth 2 (two) times daily after a meal. 30 tablet 0  ? Blood Glucose Monitoring Suppl (ACCU-CHEK GUIDE ME) w/Device KIT Use as instructed to check blood sugar 2 times daily Dx E11.9 1 kit 0  ? busPIRone (BUSPAR) 5 MG tablet Take 5 mg by mouth 2 (two) times daily.    ? Calcium Carbonate-Vitamin D 600-400 MG-UNIT per tablet Take 1 tablet by mouth daily.    ? clobetasol cream (TEMOVATE) 3.90 % Apply 1 application topically 2 (two) times daily as needed (dry skin).    ? Cyanocobalamin (B-12) 2500 MCG TABS Take 2,500 mcg by mouth daily.    ? diltiazem (CARDIZEM CD) 180 MG 24 hr capsule Take 180 mg by mouth daily.    ? doxycycline (VIBRA-TABS) 100 MG tablet Take 1 tablet (100 mg total) by mouth 2 (two) times daily. 60 tablet 5  ? esomeprazole (NEXIUM) 40 MG capsule Take 20 mg by mouth every morning.    ? FLOVENT HFA 110 MCG/ACT inhaler Inhale 1 puff into the lungs daily.    ? FLUoxetine (PROZAC) 10 MG capsule Take 10 mg by mouth in the morning and at bedtime.    ? glucose blood (ACCU-CHEK GUIDE) test strip USE AS INSTRUCTED TO CHECK BLOOD SUGAR 2 TIMES DAILY 100 strip 5  ? HYDROcodone-acetaminophen (NORCO/VICODIN) 5-325 MG tablet Take 1-2 tablets by mouth every 6 (six) hours as needed for  moderate pain (pain score 4-6). 30 tablet 0  ? lactulose (CHRONULAC) 10 GM/15ML solution Take 15 mLs (10 g total) by mouth daily. 236 mL 0  ? memantine (NAMENDA) 10 MG tablet Take 10 mg by mouth 2 (two) times daily.    ? metFORMIN (GLUCOPHAGE-XR) 500 MG 24 hr tablet Take 1 tablet (500 mg total) by mouth daily. 90 tablet 3  ? olmesartan (BENICAR) 40 MG tablet Take 1 tablet (40 mg total) by mouth daily. Please hold this medication, repeat kidney function at your pcp 's office , pcp to decide on when to resume olmesartan.    ? ondansetron (ZOFRAN) 4 MG tablet Take 1 tablet (4 mg total) by mouth every 8 (eight) hours as needed for  nausea or vomiting. 15 tablet 0  ? pravastatin (PRAVACHOL) 40 MG tablet Take 40 mg by mouth daily.    ? primidone (MYSOLINE) 250 MG tablet Take 250 mg by mouth in the morning and at bedtime.     ? Semaglutide, 1 MG/DOSE, (OZEMPIC, 1 MG/DOSE,) 4 MG/3ML SOPN Inject 1 mg as directed once a week. 9 mL 3  ? tiZANidine (ZANAFLEX) 4 MG tablet Take 1 tablet (4 mg total) by mouth every 6 (six) hours as needed for muscle spasms. 40 tablet 1  ? topiramate (TOPAMAX) 25 MG tablet Take 25 mg by mouth daily.    ? TREMFYA 100 MG/ML SOPN Inject 100 mg into the skin every 30 (thirty) days.    ? ?No current facility-administered medications on file prior to visit.  ? ? 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 After last seen in 1/24, I had prescribed her Doxycycline and cefadroxil which she was unable to tolerate. She was feeling poorly with low energy and stomach upset after which I told her to stop taking the cefadroxil and continue taking the doxycycline only. She has been taking Doxycycline only since 2/10 and had no issues so far. She also needs to start cosentyx for her Psoriatic arthritis as he joint pain are getting worse. Discussed with her about the potential risks of infection with the medicine and the decision to start would be a based on her  risk vs benefit ratio.  ? ?01/30/22 ?She has been taking doxycycline and augmentin since last visit. Denies missing doses. But is struggling with nausea and stomach upset. She also had vomiting this morning. Denies abdominal cramps and diarrhea. She tells me she was started on ? New medicine by Rheumatology approx 2 months ago and is currently on monthly dosing. She has been following with GI for Liver cirrhosis and dysphagia. 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 She is also following with Pulmonary for asthma management and Podiatry for dystrophic nails.  ? ?Review of Systems: ?ROS all systems reviewed and negative except as above ? ?Past Medical  History:  ?Diagnosis Date  ? Anxiety   ? Arthritis   ? psoriatic arithritis, DDD   ? Asthma   ? Depression   ? Diabetes mellitus   ? Gastritis   ? Gastroparesis   ? GERD (gastroesophageal reflux disease)   ? Hernia   ? Hyperlipemia   ? Hyperlipemia   ? Hypertension   ? Liver cirrhosis ([place])   ? Neuropathy   ? 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 Obesity   ? Osteoporosis   ? Plantar fasciitis   ? Stricture and stenosis of esophagus   ? TIA (transient ischemic attack)   ? 8-10 yrs ago,  no problems since  ? Tuberculosis   ? tested positive 2011, no symptoms, was on medicine for 6 months  ? ?Past Surgical History:  ?Procedure Laterality Date  ? BREAST SURGERY Bilateral   ? biopsies both breast  ? CARDIAC CATHETERIZATION    ? CESAREAN SECTION    ? x 2   ? CHOLECYSTECTOMY    ? COLONOSCOPY    ? FOOT SURGERY Left   ? spur removal  ? HERNIA REPAIR    ? IR FLUORO GUIDE CV LINE RIGHT  04/12/2021  ? IR US GUIDE VASC ACCESS RIGHT  04/12/2021  ? JOINT REPLACEMENT    ? bilat knee   ? KNEE SURGERY Bilateral   ? x 2 joint knee replacements  ? TOTAL KNEE REVISION Left 10/03/2021  ? Procedure: LEFT TOTAL KNEE REVISION POLY SWAP WITH IRRIGATION AND DEBRIDEMENT;  Surgeon: Melrose Nakayama, MD;  Location: [facility];  Service: Orthopedics;  Laterality: Left;  ? UPPER GASTROINTESTINAL ENDOSCOPY    ? WISDOM TOOTH EXTRACTION    ? WISDOM TOOTH EXTRACTION    ? ? ?Social History  ? ?Tobacco Use  ? Smoking status: Never  ? Smokeless tobacco: Never  ?Vaping Use  ? Vaping Use: Never used  ?Substance Use Topics  ? Alcohol use: Not Currently  ?  Comment: socially  ? Drug use: No  ? ? ?Family History  ?Problem Relation Age of Onset  ? Diabetes Other   ?     maternal aunts and uncles  ? Diabetes Father   ? Liver disease Father   ? Emphysema Father   ?     smoked  ? Diabetes Sister   ? Diabetes Brother   ? Sarcoidosis Brother   ? Emphysema Mother   ?     smoked  ? Asthma Mother   ? Asthma Brother   ? Colon cancer Neg Hx   ? Esophageal cancer Neg Hx   ? Stomach  cancer Neg Hx   ? Rectal cancer Neg Hx   ? ? ?Allergies  ?Allergen Reactions  ? Lisinopril Cough  ? ? ?Health Maintenance  ?Topic Date Due  ? Zoster Vaccines- Shingrix (1 of 2) Never done  ? PAP SMEAR-Mo

## 2022-01-31 LAB — COMPREHENSIVE METABOLIC PANEL
AG Ratio: 0.9 (calc) — ABNORMAL LOW (ref 1.0–2.5)
ALT: 14 U/L (ref 6–29)
AST: 22 U/L (ref 10–35)
Albumin: 3.7 g/dL (ref 3.6–5.1)
Alkaline phosphatase (APISO): 109 U/L (ref 37–153)
BUN/Creatinine Ratio: 17 (calc) (ref 6–22)
BUN: 18 mg/dL (ref 7–25)
CO2: 24 mmol/L (ref 20–32)
Calcium: 8.9 mg/dL (ref 8.6–10.4)
Chloride: 106 mmol/L (ref 98–110)
Creat: 1.09 mg/dL — ABNORMAL HIGH (ref 0.50–1.05)
Globulin: 4.2 g/dL (calc) — ABNORMAL HIGH (ref 1.9–3.7)
Glucose, Bld: 108 mg/dL — ABNORMAL HIGH (ref 65–99)
Potassium: 4.1 mmol/L (ref 3.5–5.3)
Sodium: 138 mmol/L (ref 135–146)
Total Bilirubin: 0.3 mg/dL (ref 0.2–1.2)
Total Protein: 7.9 g/dL (ref 6.1–8.1)

## 2022-01-31 LAB — CBC
HCT: 35.1 % (ref 35.0–45.0)
Hemoglobin: 11.1 g/dL — ABNORMAL LOW (ref 11.7–15.5)
MCH: 24.3 pg — ABNORMAL LOW (ref 27.0–33.0)
MCHC: 31.6 g/dL — ABNORMAL LOW (ref 32.0–36.0)
MCV: 77 fL — ABNORMAL LOW (ref 80.0–100.0)
MPV: 11.1 fL (ref 7.5–12.5)
Platelets: 302 10*3/uL (ref 140–400)
RBC: 4.56 10*6/uL (ref 3.80–5.10)
RDW: 17.9 % — ABNORMAL HIGH (ref 11.0–15.0)
WBC: 6.5 10*3/uL (ref 3.8–10.8)

## 2022-02-01 ENCOUNTER — Telehealth: Payer: Self-pay

## 2022-02-01 DIAGNOSIS — R110 Nausea: Secondary | ICD-10-CM

## 2022-02-01 MED ORDER — ONDANSETRON HCL 4 MG PO TABS: 4.0000 mg | ORAL_TABLET | Freq: Three times a day (TID) | ORAL | 0 refills | Status: DC | PRN

## 2022-02-01 NOTE — Telephone Encounter (Signed)
Patient left voicemail requesting medication for nausea be sent in. Will route to provider.  ? ?Beryle Flock, RN ? ?

## 2022-02-01 NOTE — Telephone Encounter (Signed)
Per Dr. West Bali reached out to [facility] to have last office note faxed to office. Will have notes faxed to triage and leave them in providers box when received. ? ?Dr.Hawkes  ?P: [phone] ?Leatrice Jewels, RMA  ?

## 2022-02-01 NOTE — Telephone Encounter (Signed)
Notified patient that Zofran has been refilled.  ? ?Beryle Flock, RN ? ?

## 2022-02-01 NOTE — Addendum Note (Signed)
Addended by: Lucie Leather D on: 02/01/2022 03:48 PM ? ? Modules accepted: Orders ? ?

## 2022-02-18 ENCOUNTER — Emergency Department (HOSPITAL_COMMUNITY): Payer: Medicare Other

## 2022-02-18 ENCOUNTER — Emergency Department (HOSPITAL_COMMUNITY)
Admission: EM | Admit: 2022-02-18 | Discharge: 2022-02-19 | Disposition: A | Discharge status: Home / Self Care | Payer: Medicare Other | Attending: Emergency Medicine | Admitting: Emergency Medicine

## 2022-02-18 ENCOUNTER — Encounter (HOSPITAL_COMMUNITY): Payer: Self-pay | Admitting: Emergency Medicine

## 2022-02-18 ENCOUNTER — Other Ambulatory Visit: Payer: Self-pay

## 2022-02-18 DIAGNOSIS — M25562 Pain in left knee: Secondary | ICD-10-CM | POA: Insufficient documentation

## 2022-02-18 DIAGNOSIS — S0990XA Unspecified injury of head, initial encounter: Secondary | ICD-10-CM | POA: Insufficient documentation

## 2022-02-18 DIAGNOSIS — Z7982 Long term (current) use of aspirin: Secondary | ICD-10-CM | POA: Diagnosis not present

## 2022-02-18 DIAGNOSIS — W07XXXA Fall from chair, initial encounter: Secondary | ICD-10-CM | POA: Insufficient documentation

## 2022-02-18 DIAGNOSIS — M25522 Pain in left elbow: Secondary | ICD-10-CM

## 2022-02-18 DIAGNOSIS — M546 Pain in thoracic spine: Secondary | ICD-10-CM | POA: Diagnosis not present

## 2022-02-18 DIAGNOSIS — S50312A Abrasion of left elbow, initial encounter: Secondary | ICD-10-CM | POA: Insufficient documentation

## 2022-02-18 DIAGNOSIS — S59902A Unspecified injury of left elbow, initial encounter: Secondary | ICD-10-CM | POA: Diagnosis present

## 2022-02-18 DIAGNOSIS — Z23 Encounter for immunization: Secondary | ICD-10-CM | POA: Diagnosis not present

## 2022-02-18 DIAGNOSIS — W19XXXA Unspecified fall, initial encounter: Secondary | ICD-10-CM

## 2022-02-18 MED ORDER — HYDROCODONE-ACETAMINOPHEN 5-325 MG PO TABS: 1.0000 | ORAL_TABLET | Freq: Four times a day (QID) | ORAL | 0 refills | Status: DC | PRN

## 2022-02-18 MED ORDER — HYDROCODONE-ACETAMINOPHEN 5-325 MG PO TABS
1.0000 | ORAL_TABLET | Freq: Once | ORAL | Status: AC
  Administered 2022-02-18: 1 via ORAL
  Filled 2022-02-18: qty 1

## 2022-02-18 MED ORDER — TETANUS-DIPHTH-ACELL PERTUSSIS 5-2.5-18.5 LF-MCG/0.5 IM SUSY
0.5000 mL | PREFILLED_SYRINGE | Freq: Once | INTRAMUSCULAR | Status: AC
  Administered 2022-02-18: 0.5 mL via INTRAMUSCULAR
  Filled 2022-02-18: qty 0.5

## 2022-02-18 NOTE — Discharge Instructions (Addendum)
I have prescribed you a strong narcotic called Vicodin. Please only take this as prescribed. This medication also has tylenol in it, so please be sure you are not taking more than 3000 mg of tylenol per day. Do not drive or operate heavy machinery after taking this medication. Do not mix it with alcohol.  Do not take this medication with tramadol or other narcotic pain medications.  ? ?Please follow-up with your orthopedic doctor regarding your symptoms as well as this visit today.  If you develop any new or worsening symptoms please come back to the emergency department for reevaluation. ? ?

## 2022-02-18 NOTE — ED Provider Notes (Signed)
?[place] DEPT ?Provider Note ? ? ?CSN: [id number] ?Arrival date & time: 02/18/22  2049 ? ?  ? ?History ? ?Chief Complaint  ?Patient presents with  ? Fall  ? ? ?Carrie [name] is a 66 y.o. female. ? ?HPI ?Patient is a 66 year old female who presents to the emergency department due to a fall that occurred around 3 PM today.  She states that she was watching her child's baseball game and was sitting in a chair outdoors.  She went to get up and lost her balance and fell on her left side.  She is unsure of head trauma.  No LOC.  Reports pain to the left upper back, left elbow, and left knee.  Also reports an abrasion to the left elbow.  She is unsure of the timing of her last Tdap.  States that she took a dose of tramadol around 3:30 PM with mild short-term relief but this evening her pain began to worsen so she came to the emergency department for further evaluation.  Denies being anticoagulated. ?  ? ?Home Medications ?Prior to Admission medications   ?Medication Sig Start Date End Date Taking? Authorizing Provider  ?HYDROcodone-acetaminophen (NORCO/VICODIN) 5-325 MG tablet Take 1 tablet by mouth every 6 (six) hours as needed. 02/18/22  Yes Rayna Sexton, PA-C  ?Accu-Chek Softclix Lancets lancets Use as instructed to check blood sugar 2 times daily Dx E11.9 03/23/21   Renato Shin, MD  ?albuterol (VENTOLIN HFA) 108 (90 Base) MCG/ACT inhaler Inhale 2 puffs into the lungs every 4 (four) hours as needed for wheezing or shortness of breath.    [provider]  ?Alcohol Swabs (B-D SINGLE USE SWABS REGULAR) PADS 1 each by Does not apply route 2 (two) times daily. Cleanse site BID for glucose testing; E11.9 04/08/20   Renato Shin, MD  ?amLODipine (NORVASC) 5 MG tablet Take 1 tablet (5 mg total) by mouth daily. 09/30/15   Dhungel, Flonnie Overman, MD  ?amoxicillin (AMOXIL) 500 MG capsule Take 1 capsule (500 mg total) by mouth 3 (three) times daily. 01/30/22   Rosiland Oz, MD  ?aspirin EC  81 MG tablet Take 1 tablet (81 mg total) by mouth 2 (two) times daily after a meal. 10/06/21   Loni Dolly, PA-C  ?Blood Glucose Monitoring Suppl (ACCU-CHEK GUIDE ME) w/Device KIT Use as instructed to check blood sugar 2 times daily Dx E11.9 03/23/21   Renato Shin, MD  ?busPIRone (BUSPAR) 5 MG tablet Take 5 mg by mouth 2 (two) times daily.    [provider]  ?Calcium Carbonate-Vitamin D 600-400 MG-UNIT per tablet Take 1 tablet by mouth daily. 04/26/15   [provider]  ?clobetasol cream (TEMOVATE) 3.50 % Apply 1 application topically 2 (two) times daily as needed (dry skin). 02/26/21   [provider]  ?Cyanocobalamin (B-12) 2500 MCG TABS Take 2,500 mcg by mouth daily.    [provider]  ?diltiazem (CARDIZEM CD) 180 MG 24 hr capsule Take 180 mg by mouth daily. 01/19/20   [provider]  ?doxycycline (VIBRA-TABS) 100 MG tablet Take 1 tablet (100 mg total) by mouth 2 (two) times daily. 11/07/21   Rosiland Oz, MD  ?esomeprazole (NEXIUM) 40 MG capsule Take 20 mg by mouth every morning. 01/18/20   [provider]  ?FLOVENT HFA 110 MCG/ACT inhaler Inhale 1 puff into the lungs daily. 03/17/21   [provider]  ?FLUoxetine (PROZAC) 10 MG capsule Take 10 mg by mouth in the morning and at bedtime. 09/18/21  [provider]  ?glucose blood (ACCU-CHEK GUIDE) test strip USE AS INSTRUCTED TO CHECK BLOOD SUGAR 2 TIMES DAILY 12/25/21   Renato Shin, MD  ?lactulose (CHRONULAC) 10 GM/15ML solution Take 15 mLs (10 g total) by mouth daily. 04/20/21   Florencia Reasons, MD  ?memantine (NAMENDA) 10 MG tablet Take 10 mg by mouth 2 (two) times daily.    [provider]  ?metFORMIN (GLUCOPHAGE-XR) 500 MG 24 hr tablet Take 1 tablet (500 mg total) by mouth daily. 01/16/22   Renato Shin, MD  ?olmesartan (BENICAR) 40 MG tablet Take 1 tablet (40 mg total) by mouth daily. Please hold this medication, repeat kidney function at your pcp 's office , pcp to decide on when to  resume olmesartan. 04/19/21   Florencia Reasons, MD  ?ondansetron (ZOFRAN) 4 MG tablet Take 1 tablet (4 mg total) by mouth every 8 (eight) hours as needed for nausea or vomiting. 02/01/22   Rosiland Oz, MD  ?pravastatin (PRAVACHOL) 40 MG tablet Take 40 mg by mouth daily.    [provider]  ?primidone (MYSOLINE) 250 MG tablet Take 250 mg by mouth in the morning and at bedtime.     [provider]  ?Semaglutide, 1 MG/DOSE, (OZEMPIC, 1 MG/DOSE,) 4 MG/3ML SOPN Inject 1 mg as directed once a week. 01/16/22   Renato Shin, MD  ?tiZANidine (ZANAFLEX) 4 MG tablet Take 1 tablet (4 mg total) by mouth every 6 (six) hours as needed for muscle spasms. 10/06/21 10/06/22  Loni Dolly, PA-C  ?topiramate (TOPAMAX) 25 MG tablet Take 25 mg by mouth daily.    [provider]  ?TREMFYA 100 MG/ML SOPN Inject 100 mg into the skin every 30 (thirty) days. 09/13/21   [provider]  ?   ? ?Allergies    ?Lisinopril   ? ?Review of Systems   ?Review of Systems  ?Cardiovascular:  Negative for chest pain.  ?Musculoskeletal:  Positive for arthralgias, back pain and myalgias. Negative for neck pain and neck stiffness.  ?Skin:  Positive for wound.  ?Neurological:  Negative for syncope, weakness and numbness.  ? ?Physical Exam ?Updated Vital Signs ?BP (!) 147/96 (BP Location: Left Arm)   Pulse 85   Temp 98 ?F (36.7 ?C) (Oral)   Resp 16   Ht 5' (1.524 m)   Wt 57.6 kg   LMP  (LMP Unknown)   SpO2 98%   BMI 24.80 kg/m?  ?Physical Exam ?Vitals and nursing note reviewed.  ?Constitutional:   ?   General: She is not in acute distress. ?   Appearance: Normal appearance. She is well-developed. She is not ill-appearing, toxic-appearing or diaphoretic.  ?HENT:  ?   Head: Normocephalic and atraumatic.  ?   Comments: No visible trauma noted to the face or scalp. ?   Right Ear: External ear normal.  ?   Left Ear: External ear normal.  ?   Nose: Nose normal.  ?   Mouth/Throat:  ?   Mouth: Mucous membranes are moist.  ?    Pharynx: Oropharynx is clear. No oropharyngeal exudate or posterior oropharyngeal erythema.  ?Eyes:  ?   General: No scleral icterus.    ?   Right eye: No discharge.     ?   Left eye: No discharge.  ?   Extraocular Movements: Extraocular movements intact.  ?   Conjunctiva/sclera: Conjunctivae normal.  ?   Pupils: Pupils are equal, round, and reactive to light.  ?Neck:  ?   Trachea: No tracheal deviation.  ?  Cardiovascular:  ?   Rate and Rhythm: Normal rate and regular rhythm.  ?   Pulses: Normal pulses.  ?   Heart sounds: Normal heart sounds. No murmur heard. ?  No friction rub. No gallop.  ?Pulmonary:  ?   Effort: Pulmonary effort is normal. No respiratory distress.  ?   Breath sounds: Normal breath sounds. No stridor. No wheezing, rhonchi or rales.  ?   Comments: Lungs are clear to auscultation bilaterally.  Symmetric rise and fall of the chest with inspiration and expiration.  No wheezing, rales, or rhonchi. ?Abdominal:  ?   General: Abdomen is flat. There is no distension.  ?   Palpations: Abdomen is soft.  ?   Tenderness: There is no abdominal tenderness.  ?Musculoskeletal:     ?   General: Tenderness present. No swelling or deformity. Normal range of motion.  ?   Cervical back: Normal range of motion and neck supple. No tenderness.  ?   Comments: Moderate TTP noted to the left thoracic musculature.  No crepitus.  No midline C, T, or L-spine tenderness.  No step-offs, crepitus, or deformities. ? ?Mild TTP noted circumferentially about the left elbow.  Small abrasion noted in the region as well.  No active bleeding.  Full active and passive range of motion of the left elbow.  No palpable tenderness appreciated in the left wrist, forearm, or shoulder.  2+ radial pulse.  Grip strength intact.  Distal sensation intact. ? ?Mild TTP noted along the lateral joint line of the left knee.  Unable to assess range of motion due to patient's pain.  ?Skin: ?   General: Skin is warm and dry.  ?   Findings: No rash.   ?Neurological:  ?   General: No focal deficit present.  ?   Mental Status: She is alert and oriented to person, place, and time.  ?   Cranial Nerves: Cranial nerve deficit: no gross deficits.  ?   Comments: A&O x3.  Speaking cl

## 2022-02-18 NOTE — ED Triage Notes (Signed)
Patient c/o upper back pain after trip and fall at ball game today. ?

## 2022-03-11 ENCOUNTER — Emergency Department (HOSPITAL_COMMUNITY): Payer: Medicare Other

## 2022-03-11 ENCOUNTER — Encounter: Payer: Self-pay | Admitting: Emergency Medicine

## 2022-03-11 ENCOUNTER — Ambulatory Visit
Admission: EM | Admit: 2022-03-11 | Discharge: 2022-03-11 | Payer: Medicare Other | Attending: Emergency Medicine | Admitting: Emergency Medicine

## 2022-03-11 ENCOUNTER — Observation Stay (HOSPITAL_COMMUNITY): Payer: Medicare Other

## 2022-03-11 ENCOUNTER — Inpatient Hospital Stay (HOSPITAL_COMMUNITY)
Admission: EM | Admit: 2022-03-11 | Discharge: 2022-03-13 | DRG: 641 | Disposition: A | Discharge status: Home / Self Care | Payer: Medicare Other | Attending: Family Medicine | Admitting: Family Medicine

## 2022-03-11 ENCOUNTER — Encounter (HOSPITAL_COMMUNITY): Payer: Self-pay

## 2022-03-11 ENCOUNTER — Other Ambulatory Visit: Payer: Self-pay

## 2022-03-11 DIAGNOSIS — N1831 Chronic kidney disease, stage 3a: Secondary | ICD-10-CM | POA: Diagnosis present

## 2022-03-11 DIAGNOSIS — R9431 Abnormal electrocardiogram [ECG] [EKG]: Secondary | ICD-10-CM | POA: Diagnosis not present

## 2022-03-11 DIAGNOSIS — K219 Gastro-esophageal reflux disease without esophagitis: Secondary | ICD-10-CM | POA: Diagnosis present

## 2022-03-11 DIAGNOSIS — T8454XA 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, initial encounter: Secondary | ICD-10-CM | POA: Diagnosis present

## 2022-03-11 DIAGNOSIS — N179 Acute kidney failure, unspecified: Secondary | ICD-10-CM | POA: Insufficient documentation

## 2022-03-11 DIAGNOSIS — Z96653 Presence of artificial knee joint, bilateral: Secondary | ICD-10-CM | POA: Diagnosis present

## 2022-03-11 DIAGNOSIS — E119 Type 2 diabetes mellitus without complications: Secondary | ICD-10-CM

## 2022-03-11 DIAGNOSIS — R42 Dizziness and giddiness: Secondary | ICD-10-CM | POA: Diagnosis not present

## 2022-03-11 DIAGNOSIS — F419 Anxiety disorder, unspecified: Secondary | ICD-10-CM | POA: Diagnosis present

## 2022-03-11 DIAGNOSIS — F32A Depression, unspecified: Secondary | ICD-10-CM

## 2022-03-11 DIAGNOSIS — E1122 Type 2 diabetes mellitus with diabetic chronic kidney disease: Secondary | ICD-10-CM | POA: Diagnosis present

## 2022-03-11 DIAGNOSIS — D509 Iron deficiency anemia, unspecified: Secondary | ICD-10-CM

## 2022-03-11 DIAGNOSIS — E1143 Type 2 diabetes mellitus with diabetic autonomic (poly)neuropathy: Secondary | ICD-10-CM | POA: Diagnosis present

## 2022-03-11 DIAGNOSIS — E785 Hyperlipidemia, unspecified: Secondary | ICD-10-CM | POA: Diagnosis present

## 2022-03-11 DIAGNOSIS — E860 Dehydration: Secondary | ICD-10-CM | POA: Diagnosis not present

## 2022-03-11 DIAGNOSIS — Z79899 Other long term (current) drug therapy: Secondary | ICD-10-CM

## 2022-03-11 DIAGNOSIS — N183 Chronic kidney disease, stage 3 unspecified: Secondary | ICD-10-CM | POA: Insufficient documentation

## 2022-03-11 DIAGNOSIS — M810 Age-related osteoporosis without current pathological fracture: Secondary | ICD-10-CM | POA: Diagnosis present

## 2022-03-11 DIAGNOSIS — I129 Hypertensive chronic kidney disease with stage 1 through stage 4 chronic kidney disease, or unspecified chronic kidney disease: Secondary | ICD-10-CM | POA: Diagnosis present

## 2022-03-11 DIAGNOSIS — Z7985 Long-term (current) use of injectable non-insulin antidiabetic drugs: Secondary | ICD-10-CM

## 2022-03-11 DIAGNOSIS — Z8673 Personal history of transient ischemic attack (TIA), and cerebral infarction without residual deficits: Secondary | ICD-10-CM

## 2022-03-11 DIAGNOSIS — G3184 Mild cognitive impairment, so stated: Secondary | ICD-10-CM | POA: Diagnosis present

## 2022-03-11 DIAGNOSIS — Z8611 Personal history of tuberculosis: Secondary | ICD-10-CM

## 2022-03-11 DIAGNOSIS — Z9049 Acquired absence of other specified parts of digestive tract: Secondary | ICD-10-CM

## 2022-03-11 DIAGNOSIS — T8454XS 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, sequela: Secondary | ICD-10-CM | POA: Diagnosis present

## 2022-03-11 DIAGNOSIS — Z888 Allergy status to other drugs, medicaments and biological substances status: Secondary | ICD-10-CM

## 2022-03-11 DIAGNOSIS — E118 Type 2 diabetes mellitus with unspecified complications: Secondary | ICD-10-CM

## 2022-03-11 DIAGNOSIS — Y831 Surgical operation with implant of artificial internal device as the cause of abnormal reaction of the patient, or of later complication, without mention of misadventure at the time of the procedure: Secondary | ICD-10-CM | POA: Diagnosis present

## 2022-03-11 DIAGNOSIS — Z7982 Long term (current) use of aspirin: Secondary | ICD-10-CM

## 2022-03-11 DIAGNOSIS — K7460 Unspecified cirrhosis of liver: Secondary | ICD-10-CM | POA: Diagnosis present

## 2022-03-11 DIAGNOSIS — Z7984 Long term (current) use of oral hypoglycemic drugs: Secondary | ICD-10-CM

## 2022-03-11 DIAGNOSIS — Z79891 Long term (current) use of opiate analgesic: Secondary | ICD-10-CM

## 2022-03-11 DIAGNOSIS — L4050 Arthropathic psoriasis, unspecified: Secondary | ICD-10-CM | POA: Diagnosis present

## 2022-03-11 DIAGNOSIS — I10 Essential (primary) hypertension: Secondary | ICD-10-CM | POA: Diagnosis present

## 2022-03-11 DIAGNOSIS — K3184 Gastroparesis: Secondary | ICD-10-CM | POA: Diagnosis present

## 2022-03-11 DIAGNOSIS — E1140 Type 2 diabetes mellitus with diabetic neuropathy, unspecified: Secondary | ICD-10-CM | POA: Diagnosis present

## 2022-03-11 DIAGNOSIS — R7989 Other specified abnormal findings of blood chemistry: Secondary | ICD-10-CM

## 2022-03-11 DIAGNOSIS — Z792 Long term (current) use of antibiotics: Secondary | ICD-10-CM

## 2022-03-11 LAB — CBC WITH DIFFERENTIAL/PLATELET
Abs Immature Granulocytes: 0.02 10*3/uL (ref 0.00–0.07)
Basophils Absolute: 0.1 10*3/uL (ref 0.0–0.1)
Basophils Relative: 2 %
Eosinophils Absolute: 0.1 10*3/uL (ref 0.0–0.5)
Eosinophils Relative: 1 %
HCT: 35.9 % — ABNORMAL LOW (ref 36.0–46.0)
Hemoglobin: 11 g/dL — ABNORMAL LOW (ref 12.0–15.0)
Immature Granulocytes: 0 %
Lymphocytes Relative: 42 %
Lymphs Abs: 2.6 10*3/uL (ref 0.7–4.0)
MCH: 24.2 pg — ABNORMAL LOW (ref 26.0–34.0)
MCHC: 30.6 g/dL (ref 30.0–36.0)
MCV: 79.1 fL — ABNORMAL LOW (ref 80.0–100.0)
Monocytes Absolute: 0.5 10*3/uL (ref 0.1–1.0)
Monocytes Relative: 8 %
Neutro Abs: 3 10*3/uL (ref 1.7–7.7)
Neutrophils Relative %: 47 %
Platelets: 282 10*3/uL (ref 150–400)
RBC: 4.54 MIL/uL (ref 3.87–5.11)
RDW: 18.5 % — ABNORMAL HIGH (ref 11.5–15.5)
WBC: 6.2 10*3/uL (ref 4.0–10.5)
nRBC: 0 % (ref 0.0–0.2)

## 2022-03-11 LAB — IRON AND TIBC
Iron: 30 ug/dL (ref 28–170)
Saturation Ratios: 11 % (ref 10.4–31.8)
TIBC: 280 ug/dL (ref 250–450)
UIBC: 250 ug/dL

## 2022-03-11 LAB — COMPREHENSIVE METABOLIC PANEL
ALT: 16 U/L (ref 0–44)
AST: 31 U/L (ref 15–41)
Albumin: 3.3 g/dL — ABNORMAL LOW (ref 3.5–5.0)
Alkaline Phosphatase: 75 U/L (ref 38–126)
Anion gap: 11 (ref 5–15)
BUN: 33 mg/dL — ABNORMAL HIGH (ref 8–23)
CO2: 17 mmol/L — ABNORMAL LOW (ref 22–32)
Calcium: 8.6 mg/dL — ABNORMAL LOW (ref 8.9–10.3)
Chloride: 112 mmol/L — ABNORMAL HIGH (ref 98–111)
Creatinine, Ser: 2.6 mg/dL — ABNORMAL HIGH (ref 0.44–1.00)
GFR, Estimated: 20 mL/min — ABNORMAL LOW (ref >60)
Glucose, Bld: 83 mg/dL (ref 70–99)
Potassium: 3.6 mmol/L (ref 3.5–5.1)
Sodium: 140 mmol/L (ref 135–145)
Total Bilirubin: 0.7 mg/dL (ref 0.3–1.2)
Total Protein: 7.6 g/dL (ref 6.5–8.1)

## 2022-03-11 LAB — I-STAT CHEM 8, ED
BUN: 33 mg/dL — ABNORMAL HIGH (ref 8–23)
Calcium, Ion: 1.08 mmol/L — ABNORMAL LOW (ref 1.15–1.40)
Chloride: 112 mmol/L — ABNORMAL HIGH (ref 98–111)
Creatinine, Ser: 2.7 mg/dL — ABNORMAL HIGH (ref 0.44–1.00)
Glucose, Bld: 82 mg/dL (ref 70–99)
HCT: 35 % — ABNORMAL LOW (ref 36.0–46.0)
Hemoglobin: 11.9 g/dL — ABNORMAL LOW (ref 12.0–15.0)
Potassium: 3.5 mmol/L (ref 3.5–5.1)
Sodium: 142 mmol/L (ref 135–145)
TCO2: 19 mmol/L — ABNORMAL LOW (ref 22–32)

## 2022-03-11 LAB — GLUCOSE, CAPILLARY: Glucose-Capillary: 103 mg/dL — ABNORMAL HIGH (ref 70–99)

## 2022-03-11 LAB — URINALYSIS, ROUTINE W REFLEX MICROSCOPIC
Bilirubin Urine: NEGATIVE
Glucose, UA: NEGATIVE mg/dL
Hgb urine dipstick: NEGATIVE
Ketones, ur: NEGATIVE mg/dL
Nitrite: NEGATIVE
Protein, ur: 30 mg/dL — AB
Specific Gravity, Urine: 1.014 (ref 1.005–1.030)
pH: 5 (ref 5.0–8.0)

## 2022-03-11 LAB — FERRITIN: Ferritin: 12 ng/mL (ref 11–307)

## 2022-03-11 LAB — TROPONIN I (HIGH SENSITIVITY)
Troponin I (High Sensitivity): 7 ng/L (ref <18)
Troponin I (High Sensitivity): 7 ng/L (ref <18)

## 2022-03-11 LAB — D-DIMER, QUANTITATIVE: D-Dimer, Quant: 2.22 ug/mL-FEU — ABNORMAL HIGH (ref 0.00–0.50)

## 2022-03-11 LAB — CREATININE, URINE, RANDOM: Creatinine, Urine: 171.13 mg/dL

## 2022-03-11 LAB — SODIUM, URINE, RANDOM: Sodium, Ur: 108 mmol/L

## 2022-03-11 LAB — LACTIC ACID, PLASMA: Lactic Acid, Venous: 1.8 mmol/L (ref 0.5–1.9)

## 2022-03-11 MED ORDER — SODIUM CHLORIDE 0.9% FLUSH: 3.0000 mL | INTRAVENOUS | Status: DC | PRN

## 2022-03-11 MED ORDER — BUSPIRONE HCL 5 MG PO TABS
5.0000 mg | ORAL_TABLET | Freq: Two times a day (BID) | ORAL | Status: DC
  Administered 2022-03-11 – 2022-03-13 (×4): 5 mg via ORAL
  Filled 2022-03-11 (×4): qty 1

## 2022-03-11 MED ORDER — SODIUM CHLORIDE 0.9% FLUSH
3.0000 mL | Freq: Two times a day (BID) | INTRAVENOUS | Status: DC
  Administered 2022-03-11 – 2022-03-12 (×2): 3 mL via INTRAVENOUS

## 2022-03-11 MED ORDER — ACETAMINOPHEN 325 MG PO TABS
650.0000 mg | ORAL_TABLET | Freq: Four times a day (QID) | ORAL | Status: DC | PRN
  Administered 2022-03-12: 650 mg via ORAL
  Filled 2022-03-11: qty 2

## 2022-03-11 MED ORDER — SODIUM CHLORIDE 0.9 % IV SOLN
INTRAVENOUS | Status: AC
Start: 2022-03-11 — End: 2022-03-12

## 2022-03-11 MED ORDER — DOXYCYCLINE HYCLATE 100 MG PO TABS
100.0000 mg | ORAL_TABLET | Freq: Two times a day (BID) | ORAL | Status: DC
  Administered 2022-03-11 – 2022-03-13 (×4): 100 mg via ORAL
  Filled 2022-03-11 (×4): qty 1

## 2022-03-11 MED ORDER — AMOXICILLIN 500 MG PO CAPS
500.0000 mg | ORAL_CAPSULE | Freq: Two times a day (BID) | ORAL | Status: DC
  Administered 2022-03-11 – 2022-03-13 (×4): 500 mg via ORAL
  Filled 2022-03-11 (×5): qty 1

## 2022-03-11 MED ORDER — LACTATED RINGERS IV BOLUS
1000.0000 mL | Freq: Once | INTRAVENOUS | Status: AC
  Administered 2022-03-11: 1000 mL via INTRAVENOUS

## 2022-03-11 MED ORDER — PANTOPRAZOLE SODIUM 40 MG PO TBEC
80.0000 mg | DELAYED_RELEASE_TABLET | Freq: Every day | ORAL | Status: DC
  Administered 2022-03-13: 80 mg via ORAL
  Filled 2022-03-11 (×2): qty 2

## 2022-03-11 MED ORDER — PRIMIDONE 250 MG PO TABS
250.0000 mg | ORAL_TABLET | Freq: Every day | ORAL | Status: DC
Start: 2022-03-11 — End: 2022-03-13
  Administered 2022-03-11 – 2022-03-12 (×2): 250 mg via ORAL
  Filled 2022-03-11 (×3): qty 1

## 2022-03-11 MED ORDER — ALBUTEROL SULFATE (2.5 MG/3ML) 0.083% IN NEBU: 2.5000 mg | INHALATION_SOLUTION | RESPIRATORY_TRACT | Status: DC | PRN

## 2022-03-11 MED ORDER — SODIUM CHLORIDE 0.9 % IV SOLN: 250.0000 mL | INTRAVENOUS | Status: DC | PRN

## 2022-03-11 MED ORDER — PRAVASTATIN SODIUM 40 MG PO TABS
40.0000 mg | ORAL_TABLET | Freq: Every day | ORAL | Status: DC
  Administered 2022-03-12 – 2022-03-13 (×2): 40 mg via ORAL
  Filled 2022-03-11 (×2): qty 1

## 2022-03-11 MED ORDER — INSULIN ASPART 100 UNIT/ML IJ SOLN: 0.0000 [IU] | Freq: Three times a day (TID) | INTRAMUSCULAR | Status: DC

## 2022-03-11 MED ORDER — FLUTICASONE PROPIONATE HFA 110 MCG/ACT IN AERO: 2.0000 | INHALATION_SPRAY | Freq: Two times a day (BID) | RESPIRATORY_TRACT | Status: DC

## 2022-03-11 MED ORDER — FLUOXETINE HCL 20 MG PO CAPS
20.0000 mg | ORAL_CAPSULE | Freq: Every day | ORAL | Status: DC
  Administered 2022-03-12 – 2022-03-13 (×2): 20 mg via ORAL
  Filled 2022-03-11 (×2): qty 1

## 2022-03-11 MED ORDER — HYDRALAZINE HCL 20 MG/ML IJ SOLN
10.0000 mg | Freq: Three times a day (TID) | INTRAMUSCULAR | Status: DC | PRN
  Filled 2022-03-11: qty 1

## 2022-03-11 MED ORDER — HEPARIN SODIUM (PORCINE) 5000 UNIT/ML IJ SOLN: 5000.0000 [IU] | Freq: Three times a day (TID) | INTRAMUSCULAR | Status: DC

## 2022-03-11 MED ORDER — MECLIZINE HCL 25 MG PO TABS: 25.0000 mg | ORAL_TABLET | Freq: Three times a day (TID) | ORAL | Status: DC | PRN

## 2022-03-11 MED ORDER — ACETAMINOPHEN 650 MG RE SUPP: 650.0000 mg | Freq: Four times a day (QID) | RECTAL | Status: DC | PRN

## 2022-03-11 MED ORDER — ASPIRIN 81 MG PO CHEW
324.0000 mg | CHEWABLE_TABLET | Freq: Once | ORAL | Status: AC
  Administered 2022-03-11: 324 mg via ORAL

## 2022-03-11 MED ORDER — ASPIRIN 81 MG PO TBEC
81.0000 mg | DELAYED_RELEASE_TABLET | Freq: Every day | ORAL | Status: DC
  Administered 2022-03-12 – 2022-03-13 (×2): 81 mg via ORAL
  Filled 2022-03-11 (×2): qty 1

## 2022-03-11 MED ORDER — MEMANTINE HCL 10 MG PO TABS
10.0000 mg | ORAL_TABLET | Freq: Two times a day (BID) | ORAL | Status: DC
  Administered 2022-03-11 – 2022-03-13 (×4): 10 mg via ORAL
  Filled 2022-03-11 (×5): qty 1

## 2022-03-11 MED ORDER — AMLODIPINE BESYLATE 5 MG PO TABS
5.0000 mg | ORAL_TABLET | Freq: Every day | ORAL | Status: DC
  Administered 2022-03-12 – 2022-03-13 (×2): 5 mg via ORAL
  Filled 2022-03-11 (×2): qty 1

## 2022-03-11 NOTE — Assessment & Plan Note (Signed)
She is on cosentyx. She has been on maintenance x 3 months

## 2022-03-11 NOTE — Assessment & Plan Note (Addendum)
--  Compensated --Follows with [facility] for routine monitoring

## 2022-03-11 NOTE — Assessment & Plan Note (Signed)
Continue pravachol

## 2022-03-11 NOTE — Assessment & Plan Note (Addendum)
Continue buspar and prozac

## 2022-03-11 NOTE — Assessment & Plan Note (Addendum)
--  Continue namenda BID

## 2022-03-11 NOTE — ED Provider Notes (Signed)
[facility] EMERGENCY DEPARTMENT Provider Note   CSN: [id number] Arrival date & time: 03/11/22  1457     History  Chief Complaint  Patient presents with   Dizziness    Carrie [name] is a 66 y.o. female.  HPI 66 year old female presents with lightheadedness/near-syncope and visual complaints. Started yesterday at around noon while out shopping. She felt lightheaded and had blurry/darkening vision. Didn't pass out. Overall lasted around 30 minutes, seemed to get better eventually after rest. She had tingling in both hands this morning, which has resolved, but the lightheadedness has recurred multiple times with activities. No double vision. Vision is still a little off. No current lightheadedness at rest. No chest pain, but did feel some dyspnea. Feels like she's had left ankle swelling compared to right for several weeks, no calf pain/swelling. No history of DVT/PE. No fevers or vomiting/diarrhea. Some nausea. 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 Went to urgent care and was sent here for an abnormal EKG. Had a slight headache earlier in the day that's resolved.   Has a past medical history significant for liver cirrhosis, hypertension, diabetes, and TIA.  Home Medications Prior to Admission medications   Medication Sig Start Date End Date Taking? Authorizing Provider  albuterol (VENTOLIN HFA) 108 (90 Base) MCG/ACT inhaler Inhale 2 puffs into the lungs every 4 (four) hours as needed for wheezing or shortness of breath.   Yes [provider]  amLODipine (NORVASC) 5 MG tablet Take 1 tablet (5 mg total) by mouth daily. 09/30/15  Yes Dhungel, Nishant, MD  amoxicillin (AMOXIL) 500 MG capsule Take 1 capsule (500 mg total) by mouth 3 (three) times daily. 01/30/22  Yes Rosiland Oz, MD  aspirin EC 81 MG tablet Take 1 tablet (81 mg total) by mouth 2 (two) times daily after a meal. Patient taking differently: Take 81 mg by mouth daily.  10/06/21  Yes Loni Dolly, PA-C  busPIRone (BUSPAR) 5 MG tablet Take 5 mg by mouth 2 (two) times daily.   Yes [provider]  COSENTYX SENSOREADY, 300 MG, 150 MG/ML SOAJ Inject 300 mg into the skin every 30 (thirty) days. 02/27/22  Yes [provider]  doxycycline (VIBRA-TABS) 100 MG tablet Take 1 tablet (100 mg total) by mouth 2 (two) times daily. 11/07/21  Yes Rosiland Oz, MD  esomeprazole (NEXIUM) 40 MG capsule Take 40 mg by mouth every morning. 01/18/20  Yes [provider]  FLOVENT HFA 110 MCG/ACT inhaler Inhale 2 puffs into the lungs in the morning and at bedtime. 03/17/21  Yes [provider]  FLUoxetine (PROZAC) 20 MG capsule Take 20 mg by mouth daily. 01/13/22  Yes [provider]  memantine (NAMENDA) 10 MG tablet Take 10 mg by mouth 2 (two) times daily.   Yes [provider]  metFORMIN (GLUCOPHAGE-XR) 500 MG 24 hr tablet Take 1 tablet (500 mg total) by mouth daily. 01/16/22  Yes Renato Shin, MD  pravastatin (PRAVACHOL) 40 MG tablet Take 40 mg by mouth daily.   Yes [provider]  primidone (MYSOLINE) 250 MG tablet Take 250 mg by mouth at bedtime.   Yes [provider]  Semaglutide, 1 MG/DOSE, (OZEMPIC, 1 MG/DOSE,) 4 MG/3ML SOPN Inject 1 mg as directed once a week. 01/16/22  Yes Renato Shin, MD  sertraline (ZOLOFT) 50 MG tablet Take 50 mg by mouth daily. 10/13/21  Yes [provider]  traMADol (ULTRAM) 50 MG tablet Take 50 mg by mouth 2 (  two) times daily as needed for moderate pain. 12/06/21  Yes [provider]  Accu-Chek Softclix Lancets lancets Use as instructed to check blood sugar 2 times daily Dx E11.9 03/23/21   Renato Shin, MD  Alcohol Swabs (B-D SINGLE USE SWABS REGULAR) PADS 1 each by Does not apply route 2 (two) times daily. Cleanse site BID for glucose testing; E11.9 04/08/20   Renato Shin, MD  Blood Glucose Monitoring Suppl (ACCU-CHEK GUIDE ME) w/Device KIT Use as instructed to check blood  sugar 2 times daily Dx E11.9 03/23/21   Renato Shin, MD  glucose blood (ACCU-CHEK GUIDE) test strip USE AS INSTRUCTED TO CHECK BLOOD SUGAR 2 TIMES DAILY 12/25/21   Renato Shin, MD  HYDROcodone-acetaminophen (NORCO/VICODIN) 5-325 MG tablet Take 1 tablet by mouth every 6 (six) hours as needed. Patient not taking: Reported on 03/11/2022 02/18/22   Rayna Sexton, PA-C  lactulose (CHRONULAC) 10 GM/15ML solution Take 15 mLs (10 g total) by mouth daily. Patient not taking: Reported on 03/11/2022 04/20/21   Florencia Reasons, MD  olmesartan (BENICAR) 40 MG tablet Take 1 tablet (40 mg total) by mouth daily. Please hold this medication, repeat kidney function at your pcp 's office , pcp to decide on when to resume olmesartan. Patient not taking: Reported on 03/11/2022 04/19/21   Florencia Reasons, MD  ondansetron (ZOFRAN) 4 MG tablet Take 1 tablet (4 mg total) by mouth every 8 (eight) hours as needed for nausea or vomiting. Patient not taking: Reported on 03/11/2022 02/01/22   Rosiland Oz, MD  tiZANidine (ZANAFLEX) 4 MG tablet Take 1 tablet (4 mg total) by mouth every 6 (six) hours as needed for muscle spasms. Patient not taking: Reported on 03/11/2022 10/06/21 10/06/22  Loni Dolly, PA-C      Allergies    Lisinopril    Review of Systems   Review of Systems  Constitutional:  Negative for fever.  Eyes:  Positive for visual disturbance.  Respiratory:  Positive for shortness of breath.   Cardiovascular:  Negative for chest pain.  Gastrointestinal:  Positive for nausea. Negative for abdominal pain, diarrhea and vomiting.  Neurological:  Positive for weakness, light-headedness, numbness and headaches. Negative for syncope.   Physical Exam Updated Vital Signs BP (!) 177/86 (BP Location: Right Arm)   Pulse 86   Temp 98 F (36.7 C) (Oral)   Resp 17   Ht 5' (1.524 m)   Wt 55.3 kg   LMP  (LMP Unknown)   SpO2 100%   BMI 23.83 kg/m  Physical Exam Vitals and nursing note reviewed.  Constitutional:      Appearance:  She is well-developed.  HENT:     Head: Normocephalic and atraumatic.     Mouth/Throat:     Mouth: Mucous membranes are dry.  Eyes:     Extraocular Movements: Extraocular movements intact.     Pupils: Pupils are equal, round, and reactive to light.  Cardiovascular:     Rate and Rhythm: Regular rhythm. Tachycardia present.     Heart sounds: Normal heart sounds.  Pulmonary:     Effort: Pulmonary effort is normal.     Breath sounds: Normal breath sounds. No wheezing, rhonchi or rales.  Abdominal:     General: There is no distension.     Palpations: Abdomen is soft.     Tenderness: There is no abdominal tenderness.  Musculoskeletal:     Cervical back: Normal range of motion and neck supple. No rigidity.     Comments: No decreased ROM of right  knee. No swelling/effusion or erythema.  Skin:    General: Skin is warm and dry.  Neurological:     Mental Status: She is alert.     Comments: CN 3-12 grossly intact. 5/5 strength in both upper and right lower extremities. 3/5 strength in LLE. She states she's not sure how long that has been going on. Grossly normal sensation. Normal finger to nose.     ED Results / Procedures / Treatments   Labs (all labs ordered are listed, but only abnormal results are displayed) Labs Reviewed  COMPREHENSIVE METABOLIC PANEL - Abnormal; Notable for the following components:      Result Value   Chloride 112 (*)    CO2 17 (*)    BUN 33 (*)    Creatinine, Ser 2.60 (*)    Calcium 8.6 (*)    Albumin 3.3 (*)    GFR, Estimated 20 (*)    All other components within normal limits  CBC WITH DIFFERENTIAL/PLATELET - Abnormal; Notable for the following components:   Hemoglobin 11.0 (*)    HCT 35.9 (*)    MCV 79.1 (*)    MCH 24.2 (*)    RDW 18.5 (*)    All other components within normal limits  D-DIMER, QUANTITATIVE - Abnormal; Notable for the following components:   D-Dimer, Quant 2.22 (*)    All other components within normal limits  URINALYSIS, ROUTINE W  REFLEX MICROSCOPIC - Abnormal; Notable for the following components:   APPearance HAZY (*)    Protein, ur 30 (*)    Leukocytes,Ua MODERATE (*)    Bacteria, UA RARE (*)    All other components within normal limits  GLUCOSE, CAPILLARY - Abnormal; Notable for the following components:   Glucose-Capillary 103 (*)    All other components within normal limits  I-STAT CHEM 8, ED - Abnormal; Notable for the following components:   Chloride 112 (*)    BUN 33 (*)    Creatinine, Ser 2.70 (*)    Calcium, Ion 1.08 (*)    TCO2 19 (*)    Hemoglobin 11.9 (*)    HCT 35.0 (*)    All other components within normal limits  LACTIC ACID, PLASMA  CREATININE, URINE, RANDOM  SODIUM, URINE, RANDOM  IRON AND TIBC  FERRITIN  BASIC METABOLIC PANEL  CBC  TROPONIN I (HIGH SENSITIVITY)  TROPONIN I (HIGH SENSITIVITY)    EKG EKG Interpretation  Date/Time:  Sunday Mar 11 2022 15:18:48 EDT Ventricular Rate:  115 PR Interval:  135 QRS Duration: 80 QT Interval:  336 QTC Calculation: 465 R Axis:   57 Text Interpretation: Sinus tachycardia Consider left atrial enlargement ST/T changes similar to July 2022 Confirmed by Sherwood Gambler [id number]) on 03/11/2022 3:34:03 PM  Radiology CT Head Wo Contrast  Result Date: 03/11/2022 CLINICAL DATA:  Acute neuro deficit.  Stroke suspected. EXAM: CT HEAD WITHOUT CONTRAST TECHNIQUE: Contiguous axial images were obtained from the base of the skull through the vertex without intravenous contrast. RADIATION DOSE REDUCTION: This exam was performed according to the departmental dose-optimization program which includes automated exposure control, adjustment of the mA and/or kV according to patient size and/or use of iterative reconstruction technique. COMPARISON:  CT brain 02/18/2022 and 05/27/2018 FINDINGS: Brain: There is mild cortical atrophy, within normal limits for patient age. The ventricles are normal in configuration. The basilar cisterns are patent. No significant change in  small CSF density region lateral speech basal ganglia, compatible with Virchow-Robin perivascular spaces. No mass, mass effect, or midline  shift. No acute intracranial hemorrhage is seen. No abnormal extra-axial fluid collection. Mild periventricular white matter hypodensities are similar to prior, likely chronic ischemic white matter changes. Preservation of the normal cortical gray-white interface without CT evidence of an acute major vascular territorial cortical based infarction. Vascular: No hyperdense vessel or unexpected calcification. Skull: Normal. Negative for fracture or focal lesion. Sinuses/Orbits: The visualized orbits are unremarkable. Mild posterior right sphenoid sinus mucosal opacification. The visualized mastoid air cells are clear. Other: None. IMPRESSION: 1. No significant change from prior. Mild cortical atrophy and mild chronic ischemic white matter changes. 2. No CT evidence of acute intracranial process. Electronically Signed   By: Yvonne Kendall M.D.   On: 03/11/2022 15:45   US RENAL  Result Date: 03/11/2022 CLINICAL DATA:  Acute kidney injury. EXAM: RENAL / URINARY TRACT ULTRASOUND COMPLETE COMPARISON:  Renal ultrasound 04/11/2021. CT abdomen and pelvis 04/10/2021. FINDINGS: Right Kidney: Renal measurements: 9.7 x 4.4 x 5.2 cm = volume: 114 mL. Echogenicity within normal limits. No mass or hydronephrosis visualized. Left Kidney: Renal measurements: 9.0 x 4.2 x 3.9 cm = volume: 78 mL. Echogenicity within normal limits. No mass or hydronephrosis visualized. Bladder: Appears normal for degree of bladder distention. Other: None. IMPRESSION: Normal renal ultrasound. Electronically Signed   By: Ronney Asters M.D.   On: 03/11/2022 20:24   DG Chest Portable 1 View  Result Date: 03/11/2022 CLINICAL DATA:  Dizziness, visual changes EXAM: PORTABLE CHEST 1 VIEW COMPARISON:  04/10/2021 FINDINGS: Single frontal view of the chest demonstrates an unremarkable cardiac silhouette. Background  emphysema without airspace disease, effusion, or pneumothorax. No acute bony abnormalities. IMPRESSION: 1. No acute intrathoracic process. Electronically Signed   By: Randa Ngo M.D.   On: 03/11/2022 16:03    Procedures Procedures    Medications Ordered in ED Medications  aspirin EC tablet 81 mg (has no administration in time range)  amoxicillin (AMOXIL) capsule 500 mg (500 mg Oral Given 03/11/22 2218)  doxycycline (VIBRA-TABS) tablet 100 mg (100 mg Oral Given 03/11/22 2217)  amLODipine (NORVASC) tablet 5 mg (has no administration in time range)  pravastatin (PRAVACHOL) tablet 40 mg (has no administration in time range)  busPIRone (BUSPAR) tablet 5 mg (5 mg Oral Given 03/11/22 2217)  memantine (NAMENDA) tablet 10 mg (10 mg Oral Given 03/11/22 2218)  primidone (MYSOLINE) tablet 250 mg (250 mg Oral Given 03/11/22 2218)  albuterol (PROVENTIL) (2.5 MG/3ML) 0.083% nebulizer solution 2.5 mg (has no administration in time range)  pantoprazole (PROTONIX) EC tablet 80 mg (has no administration in time range)  FLUoxetine (PROZAC) capsule 20 mg (has no administration in time range)  insulin aspart (novoLOG) injection 0-9 Units (has no administration in time range)  hydrALAZINE (APRESOLINE) injection 10 mg (has no administration in time range)  meclizine (ANTIVERT) tablet 25 mg (has no administration in time range)  sodium chloride flush (NS) 0.9 % injection 3 mL (3 mLs Intravenous Given 03/11/22 2232)  sodium chloride flush (NS) 0.9 % injection 3 mL (has no administration in time range)  0.9 %  sodium chloride infusion (has no administration in time range)  acetaminophen (TYLENOL) tablet 650 mg (has no administration in time range)    Or  acetaminophen (TYLENOL) suppository 650 mg (has no administration in time range)  0.9 %  sodium chloride infusion ( Intravenous New Bag/Given 03/11/22 2231)  lactated ringers bolus 1,000 mL (0 mLs Intravenous Stopped 03/11/22 1804)  lactated ringers bolus 1,000 mL (0  mLs Intravenous Stopped 03/11/22 1944)    ED  Course/ Medical Decision Making/ A&P                           Medical Decision Making Amount and/or Complexity of Data Reviewed External Data Reviewed: notes. Labs: ordered. Radiology: ordered and independent interpretation performed. ECG/medicine tests: ordered and independent interpretation performed.  Risk Decision regarding hospitalization.   Patient presents with dizziness that sounds like is mostly lightheadedness.  Her neuro exam is unremarkable.  Given the slight headache and complaints of visual complaints with dizziness a CT head was obtained.  She did have some left lower extremity weakness but she cannot tell me how long this been ongoing.  I do not think this is an acute stroke or at least not a code stroke.  CT head images viewed by myself and there is no edema or hemorrhage.  Chest x-ray images viewed by myself and shows no pneumonia.  Labs however do show an acute kidney injury.  Unclear exact cause.  She does appear somewhat dry on my exam so she was given IV fluids.  However with her BUN only at 33 I am worried this might be related to something else such as hypertension which has progressively worsened while in the emergency department.  Of note, she did have a little bit of dyspnea and so a D-dimer had been sent but I think this is more related to the AKI than a PE.  D-dimer is elevated but at the same time she is not hypoxic and her heart rate has improved with fluids.  I discussed this with the admitting physician, Dr. Rogers Blocker, and she will decide about VQ scan or symptomatically treating the other problems first.  No obvious infection.  ECG shows sinus tachycardia but no acute ischemic changes.        Final Clinical Impression(s) / ED Diagnoses Final diagnoses:  Acute kidney injury [facility])    Rx / DC Orders ED Discharge Orders     None         Sherwood Gambler, MD 03/11/22 2302

## 2022-03-11 NOTE — ED Notes (Signed)
Bladder scan showed 53m.

## 2022-03-11 NOTE — ED Notes (Signed)
Patient is being discharged from the Urgent Care and sent to the Emergency Department via [facility] . Per Provider Fenton Malling, patient is in need of higher level of care due to ST abnormality & tachycardia. Patient is aware and verbalizes understanding of plan of care.  Vitals:   03/11/22 1354  BP: 105/69  Pulse: (!) 128  Resp: 18  Temp: 97.8 F (36.6 C)  SpO2: 97%

## 2022-03-11 NOTE — ED Provider Notes (Signed)
Patient presents urgent care complaining of dizziness and near syncope with tingling in her fingers of both of her hands today.  On arrival, patient is a heart rate of 128.  EKG obtained which revealed abnormal ST depression in leads II and III with some questionable elevation in V2 and V3.  EMS was contacted and patient was transported to the emergency room for further evaluation for possible MI.   Lynden Oxford Scales, PA-C 03/11/22 1424

## 2022-03-11 NOTE — ED Triage Notes (Signed)
Pt here for dizziness and near syncope yesterday with bilateral finger tingling today; pt noted HR 128bpm

## 2022-03-11 NOTE — H&P (Signed)
History and Physical    Patient: Carrie [name] VEH:[id number] DOB: 1955-11-28 DOA: 03/11/2022 DOS: the patient was seen and examined on 03/11/2022 PCP: Jenel Lucks, PA-C  Patient coming from: Home - lives with her significant other. Independent with ambulation.    Chief Complaint: dizziness   HPI: Carrie [name] is a 66 y.o. 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 She was shopping last night and started to feel lightheaded and dizzy and almost like she was going to pass out. Her vision also seemed like it was going out.  She sat down and then went to the car. She went to another store and after walking around had similar feelings and went home.  She was fine at home.  When she got up this AM she had tingling in her fingers and had the same symptoms with dizziness, lightheadedness and feeling like she was going to pass out. The symptoms went away after she was still and has not had again. She has not had anymore tingling in her fingers.  She has had no weakness or focal deficits.   She states the 3 episodes of dizziness felt like she was going to pass out, but were worse with head movement. Felt like the world was spinning and she was standing still. No tinnitus. No recent URI/sinus infection, no air travel.    Denies any fever/chills, headaches, chest pain or palpitations, shortness of breath or cough, abdominal pain, N/V/D, dysuria or leg swelling. She does feel like she has had decreased urine output. Also states she has had weight loss, but has been on ozempic for her diabetes.   Saw her nephrologist 2 weeks ago and was told to go back in one year.   Does not smoke or drink  alcohol.   ER Course:  vitals: afebrile, bp: 130/101, HR: 104, RR: 15, oxygen: 98%RA Pertinent labs: hgb: 11, BUN: 33, creatinine: 2.60, d-dimer: 2.22,  CT head: no acute finding CXR: no acute finding In ED: given 2L bolus.    Review of Systems: As mentioned in the history of present illness. All other systems reviewed and are negative. Past Medical History:  Diagnosis Date   Anxiety    Arthritis    psoriatic arithritis, DDD    Asthma    Depression    Diabetes mellitus    Gastritis    Gastroparesis    GERD (gastroesophageal reflux disease)    Hernia    Hyperlipemia    Hyperlipemia    Hypertension    Liver cirrhosis ([place])    Neuropathy    Bil feet re: to Diabetes   Obesity    Osteoporosis    Plantar fasciitis    Stricture and stenosis of esophagus    TIA (transient ischemic attack)    8-10 yrs ago, no problems since   Tuberculosis    tested positive 2011, no symptoms, was on medicine for 6 months   Past Surgical History:  Procedure Laterality Date   BREAST SURGERY Bilateral    biopsies both breast   CARDIAC CATHETERIZATION     CESAREAN SECTION     x 2    CHOLECYSTECTOMY     COLONOSCOPY     FOOT SURGERY Left  spur removal   HERNIA REPAIR     IR FLUORO GUIDE CV LINE RIGHT  04/12/2021   IR US GUIDE VASC ACCESS RIGHT  04/12/2021   JOINT REPLACEMENT     bilat knee    KNEE SURGERY Bilateral    x 2 joint knee replacements   TOTAL KNEE REVISION Left 10/03/2021   Procedure: LEFT TOTAL KNEE REVISION POLY SWAP WITH IRRIGATION AND DEBRIDEMENT;  Surgeon: Melrose Nakayama, MD;  Location: [facility];  Service: Orthopedics;  Laterality: Left;   UPPER GASTROINTESTINAL ENDOSCOPY     WISDOM TOOTH EXTRACTION     WISDOM TOOTH EXTRACTION     Social History:  reports that she has never smoked. She has never used smokeless tobacco. She reports that she does not currently use alcohol. She reports that she does not use drugs.  Allergies  Allergen Reactions   Lisinopril Cough     Family History  Problem Relation Age of Onset   Diabetes Other        maternal aunts and uncles   Diabetes Father    Liver disease Father    Emphysema Father        smoked   Diabetes Sister    Diabetes Brother    Sarcoidosis Brother    Emphysema Mother        smoked   Asthma Mother    Asthma Brother    Colon cancer Neg Hx    Esophageal cancer Neg Hx    Stomach cancer Neg Hx    Rectal cancer Neg Hx     Prior to Admission medications   Medication Sig Start Date End Date Taking? Authorizing Provider  albuterol (VENTOLIN HFA) 108 (90 Base) MCG/ACT inhaler Inhale 2 puffs into the lungs every 4 (four) hours as needed for wheezing or shortness of breath.   Yes [provider]  amLODipine (NORVASC) 5 MG tablet Take 1 tablet (5 mg total) by mouth daily. 09/30/15  Yes Dhungel, Nishant, MD  amoxicillin (AMOXIL) 500 MG capsule Take 1 capsule (500 mg total) by mouth 3 (three) times daily. 01/30/22  Yes Rosiland Oz, MD  aspirin EC 81 MG tablet Take 1 tablet (81 mg total) by mouth 2 (two) times daily after a meal. Patient taking differently: Take 81 mg by mouth daily. 10/06/21  Yes Loni Dolly, PA-C  busPIRone (BUSPAR) 5 MG tablet Take 5 mg by mouth 2 (two) times daily.   Yes [provider]  COSENTYX SENSOREADY, 300 MG, 150 MG/ML SOAJ Inject 300 mg into the skin every 30 (thirty) days. 02/27/22  Yes [provider]  doxycycline (VIBRA-TABS) 100 MG tablet Take 1 tablet (100 mg total) by mouth 2 (two) times daily. 11/07/21  Yes Rosiland Oz, MD  esomeprazole (NEXIUM) 40 MG capsule Take 40 mg by mouth every morning. 01/18/20  Yes [provider]  FLOVENT HFA 110 MCG/ACT inhaler Inhale 2 puffs into the lungs in the morning and at bedtime. 03/17/21  Yes [provider]  FLUoxetine (PROZAC) 20 MG capsule Take 20 mg by mouth daily. 01/13/22  Yes [provider]  memantine (NAMENDA) 10 MG tablet Take 10 mg by mouth 2 (two) times daily.   Yes  [provider]  metFORMIN (GLUCOPHAGE-XR) 500 MG 24 hr tablet Take 1 tablet (500 mg total) by mouth daily. 01/16/22  Yes Renato Shin, MD  pravastatin (PRAVACHOL) 40 MG tablet Take 40 mg by mouth daily.   Yes [provider]  primidone (MYSOLINE) 250 MG tablet Take 250 mg  by mouth at bedtime.   Yes [provider]  Semaglutide, 1 MG/DOSE, (OZEMPIC, 1 MG/DOSE,) 4 MG/3ML SOPN Inject 1 mg as directed once a week. 01/16/22  Yes Renato Shin, MD  sertraline (ZOLOFT) 50 MG tablet Take 50 mg by mouth daily. 10/13/21  Yes [provider]  traMADol (ULTRAM) 50 MG tablet Take 50 mg by mouth 2 (two) times daily as needed for moderate pain. 12/06/21  Yes [provider]  Accu-Chek Softclix Lancets lancets Use as instructed to check blood sugar 2 times daily Dx E11.9 03/23/21   Renato Shin, MD  Alcohol Swabs (B-D SINGLE USE SWABS REGULAR) PADS 1 each by Does not apply route 2 (two) times daily. Cleanse site BID for glucose testing; E11.9 04/08/20   Renato Shin, MD  Blood Glucose Monitoring Suppl (ACCU-CHEK GUIDE ME) w/Device KIT Use as instructed to check blood sugar 2 times daily Dx E11.9 03/23/21   Renato Shin, MD  glucose blood (ACCU-CHEK GUIDE) test strip USE AS INSTRUCTED TO CHECK BLOOD SUGAR 2 TIMES DAILY 12/25/21   Renato Shin, MD  HYDROcodone-acetaminophen (NORCO/VICODIN) 5-325 MG tablet Take 1 tablet by mouth every 6 (six) hours as needed. Patient not taking: Reported on 03/11/2022 02/18/22   Rayna Sexton, PA-C  lactulose (CHRONULAC) 10 GM/15ML solution Take 15 mLs (10 g total) by mouth daily. Patient not taking: Reported on 03/11/2022 04/20/21   Florencia Reasons, MD  olmesartan (BENICAR) 40 MG tablet Take 1 tablet (40 mg total) by mouth daily. Please hold this medication, repeat kidney function at your pcp 's office , pcp to decide on when to resume olmesartan. Patient not taking: Reported on 03/11/2022 04/19/21   Florencia Reasons, MD  ondansetron (ZOFRAN) 4 MG tablet Take 1  tablet (4 mg total) by mouth every 8 (eight) hours as needed for nausea or vomiting. Patient not taking: Reported on 03/11/2022 02/01/22   Rosiland Oz, MD  tiZANidine (ZANAFLEX) 4 MG tablet Take 1 tablet (4 mg total) by mouth every 6 (six) hours as needed for muscle spasms. Patient not taking: Reported on 03/11/2022 10/06/21 10/06/22  Loni Dolly, PA-C    Physical Exam: Vitals:   03/11/22 1645 03/11/22 1730 03/11/22 1800 03/11/22 1830  BP: (!) 145/118 (!) 183/81 (!) 186/114 (!) 193/92  Pulse: 86 90 90 82  Resp: _0 Temp:      TempSrc:      SpO2: 100% 97% 100% 100%  Weight:      Height:       General:  Appears calm and comfortable and is in NAD Eyes:  PERRL, EOMI, normal lids, iris ENT:  grossly normal hearing, lips & tongue, mmm; appropriate dentition-dentures. TM pearly with light reflex bilaterally  Neck:  no LAD, masses or thyromegaly; no carotid bruits Cardiovascular:  RRR, no m/r/g. No LE edema.  Respiratory:   CTA bilaterally with no wheezes/rales/rhonchi.  Normal respiratory effort. Abdomen:  soft, NT, ND, NABS Back:   normal alignment, no CVAT Skin:  no rash or induration seen on limited exam Musculoskeletal:  grossly normal tone BUE/BLE, good ROM, no bony abnormality Lower extremity:  No LE edema.  Limited foot exam with no ulcerations.  2+ distal pulses. Psychiatric:  grossly normal mood and affect, speech fluent and appropriate, AOx3 Neurologic:  CN 2-12 grossly intact, moves all extremities in coordinated fashion, sensation intact. +dix halpike bilaterally. L>R. Intentional tremor    Radiological Exams on Admission: Independently reviewed - see discussion in A/P where applicable  CT Head Wo  Contrast  Result Date: 03/11/2022 CLINICAL DATA:  Acute neuro deficit.  Stroke suspected. EXAM: CT HEAD WITHOUT CONTRAST TECHNIQUE: Contiguous axial images were obtained from the base of the skull through the vertex without intravenous contrast. RADIATION DOSE  REDUCTION: This exam was performed according to the departmental dose-optimization program which includes automated exposure control, adjustment of the mA and/or kV according to patient size and/or use of iterative reconstruction technique. COMPARISON:  CT brain 02/18/2022 and 05/27/2018 FINDINGS: Brain: There is mild cortical atrophy, within normal limits for patient age. The ventricles are normal in configuration. The basilar cisterns are patent. No significant change in small CSF density region lateral speech basal ganglia, compatible with Virchow-Robin perivascular spaces. No mass, mass effect, or midline shift. No acute intracranial hemorrhage is seen. No abnormal extra-axial fluid collection. Mild periventricular white matter hypodensities are similar to prior, likely chronic ischemic white matter changes. Preservation of the normal cortical gray-white interface without CT evidence of an acute major vascular territorial cortical based infarction. Vascular: No hyperdense vessel or unexpected calcification. Skull: Normal. Negative for fracture or focal lesion. Sinuses/Orbits: The visualized orbits are unremarkable. Mild posterior right sphenoid sinus mucosal opacification. The visualized mastoid air cells are clear. Other: None. IMPRESSION: 1. No significant change from prior. Mild cortical atrophy and mild chronic ischemic white matter changes. 2. No CT evidence of acute intracranial process. Electronically Signed   By: Yvonne Kendall M.D.   On: 03/11/2022 15:45   DG Chest Portable 1 View  Result Date: 03/11/2022 CLINICAL DATA:  Dizziness, visual changes EXAM: PORTABLE CHEST 1 VIEW COMPARISON:  04/10/2021 FINDINGS: Single frontal view of the chest demonstrates an unremarkable cardiac silhouette. Background emphysema without airspace disease, effusion, or pneumothorax. No acute bony abnormalities. IMPRESSION: 1. No acute intrathoracic process. Electronically Signed   By: Randa Ngo M.D.   On: 03/11/2022  16:03    EKG: Independently reviewed.  Sinus tachycardia with rate 115; nonspecific ST changes with no evidence of acute ischemia   Labs on Admission: I have personally reviewed the available labs and imaging studies at the time of the admission.  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, initial encounter ([place])   Type 2 diabetes mellitus with complication, without long-term current use of insulin (HCC)   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HCC)   Anxiety and depression   GERD with esophageal stricture and stenosis s/p dilation    Psoriatic arthritis ([place])   Mild neurocognitive disorder   Hyperlipidemia    Assessment and Plan: * Acute renal failure superimposed on stage 3a chronic kidney disease ([place]) 66 year old female with 2 day history of intermittent dizziness and feeling like she could pass out as well as difficulty urinating found to be in acute on CKD stage 3 (1.09-1.3)  -obs to telemetry -UA with no hgb/RBC -hx of not being able to urinate well with only a few drops -check renal US for obstructive cause and bladder scan now by nurse -urine studies pending -hold nephrotoxic drugs (has been on metformin +olmesartan) -strict I/O -received 2L IVF in ED, will hold on fluids until bladder scan results -trend   Dizziness 3 episodes of dizziness  that resolved when she sat still dix-halpike + bilaterally L>R No neuro deficits on exam, no abnormal finding on CT Had episode of hand tingling, resolved. No weakness Discussed with her MRI, but ideally would like to check this with and without contrast and would like renal function to improve Will continue neuro  checks Meclizine prn Troponin wnl x2 Unsure if AKI could be contributing  Check orthostatics, happened in setting of feeling like she could pass out. Consider echo  Keep on telemetry  Consider MRI if renal fx improves Outpatient vestibular therapy or home exercises   Elevated d-dimer Elevated to 2.22, appears she has had elevated d-dimers in the past  She has no tachycardia, shortness of breath or leg swelling Can not CTA her as she renal function is elevated.  I have a very low suspicion for PE, continue to monitor at this time   Microcytic anemia Baseline appears to be normocytic around 9-11 in setting of CKD  Now microcytic, but at baseline.  Will check iron studies  cscope 11/2018. Recall 5 years   HTN (hypertension) Was initially normotensive and bp has steadily creeped up while in ED Received 2L IVF Unsure what medication she is actually taking. Told pharm tech she was not taking olmesartan, but she tells me she is def. 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, initial encounter ([place]) Followed by ID Continue daily amoxicillin and doxycycline   Type 2 diabetes mellitus with complication, without long-term current use of insulin (HCC) Well controlled, recent A1C in April was 5.8 Hold metformin with AKI, also on ozempic weekly SSI and accuchecks per protocol   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[place]) Compensated; ?due to fatty liver +/- past MTX use  Follows with [facility] for routine monitoring   Anxiety and depression Continue buspar and prozac   GERD with esophageal stricture and stenosis s/p dilation  Continue with PPI  EGD in 12/2021 with findings below  Localized erythematous mucosa with erosion in the GE junction  Benign-appearing intrinsic stricture  (traversable) in the GE junction; dilated with Savary-Gilliard dilator using guidewire to 36 Fr ending size  The esophagus was otherwise normal. There were no esophageal varices. Random biopsies were obtained to exclude eosinophilic esophagitis.  Mild, patchy erythematous mucosa in the antrum and prepyloric region; performed cold forceps biopsy to rule out H. pylori. The stomach was otherwise normal. No gastric varices or gastric antral vascular ectasia was noted.  The duodenal bulb, 1st part of the duodenum and 2nd part of the duodenum appeared normal.   Psoriatic arthritis ([place]) She is on cosentyx. She has been on maintenance x 3 months   Mild neurocognitive disorder Continue namenda BID She seems confused on what medication she is taking based off what pharm tech put in chart   Hyperlipidemia Continue pravachol     Advance Care Planning:   Code Status: Prior   Consults: none   DVT Prophylaxis: TED hose   Family Communication: none   Severity of Illness: The appropriate patient status for this patient is OBSERVATION. Observation status is judged to be reasonable and necessary in order to provide the required intensity of service to ensure the patient's safety. The patient's presenting symptoms, physical exam findings, and initial radiographic and laboratory data in the context of their medical condition  is felt to place them at decreased risk for further clinical deterioration. Furthermore, it is anticipated that the patient will be medically stable for discharge from the hospital within 2 midnights of admission.   Author: Orma Flaming, MD 03/11/2022 8:22 PM  For on call review [email or web address].

## 2022-03-11 NOTE — Assessment & Plan Note (Addendum)
--   Multiple episodes of lightheadedness in the context of prolonged walking.  No orthostatics done on admission that I see. -- In the context of acute kidney injury and history, most likely related to dehydration, vasovagal type phenomenon.  No signs or symptoms to suggest vertigo at this point, no signs or symptoms at this point to suggest acute CNS process.  No further imaging at this time.

## 2022-03-11 NOTE — Assessment & Plan Note (Addendum)
--  Followed by ID, continue daily amoxicillin and doxycycline

## 2022-03-11 NOTE — Assessment & Plan Note (Addendum)
--  Continue with PPI

## 2022-03-11 NOTE — Assessment & Plan Note (Addendum)
--  Well controlled, recent A1C in April was 5.8 --Hold metformin with AKI, also on ozempic weekly --SSI and accuchecks per protocol

## 2022-03-11 NOTE — Assessment & Plan Note (Addendum)
--   BUN and creatinine elevated, somewhat improved today after IV fluids, urine output adequate, renal ultrasound showed normal kidneys. -- Etiology unclear but may be secondary simply to decreased oral intake complicated by Benicar.  She has had substantial weight loss on Ozempic so fluid intake may also be compromised. -- Regardless seems to be responding to fluids.  Continue to hold ARB. --continue IVF, check BMP in AM

## 2022-03-11 NOTE — Assessment & Plan Note (Addendum)
--  labile; hold ARB, unclear what meds she is on. Monitor

## 2022-03-11 NOTE — ED Triage Notes (Signed)
Dizziness that started yesterday. Pt woke up this AM with tingling to fingertips. Seen at [facility] and was sent here for abnormal EKG. Reports some shortness of breath. Alert and oriented x 4.

## 2022-03-11 NOTE — Assessment & Plan Note (Addendum)
--  D-dimer was significantly elevated but it is not clear why this test was ordered on review of documentation.  Brief tachycardia was noted in the emergency department although this might be spurious as it is not unconfirmed vital in the record.  On review of her chart I do see elevated D-dimer in 2011 and 2012.  In 2011 at the same time CTA chest was negative for PE.  Appears the patient has a chronic elevation of D-dimer.  Given lack of specific signs or symptoms and chronicity no further testing is recommended.

## 2022-03-11 NOTE — Assessment & Plan Note (Addendum)
--  Baseline appears to be normocytic around 11 in setting of CKD. Ferritin low but on abx for chronic infection --follow-up as an outpatient

## 2022-03-12 DIAGNOSIS — I10 Essential (primary) hypertension: Secondary | ICD-10-CM

## 2022-03-12 DIAGNOSIS — G3184 Mild cognitive impairment, so stated: Secondary | ICD-10-CM

## 2022-03-12 DIAGNOSIS — E118 Type 2 diabetes mellitus with unspecified complications: Secondary | ICD-10-CM

## 2022-03-12 DIAGNOSIS — N179 Acute kidney failure, unspecified: Secondary | ICD-10-CM | POA: Diagnosis not present

## 2022-03-12 LAB — GLUCOSE, CAPILLARY
Glucose-Capillary: 93 mg/dL (ref 70–99)
Glucose-Capillary: 123 mg/dL — ABNORMAL HIGH (ref 70–99)
Glucose-Capillary: 145 mg/dL — ABNORMAL HIGH (ref 70–99)
Glucose-Capillary: 122 mg/dL — ABNORMAL HIGH (ref 70–99)

## 2022-03-12 LAB — BASIC METABOLIC PANEL
Anion gap: 7 (ref 5–15)
BUN: 26 mg/dL — ABNORMAL HIGH (ref 8–23)
CO2: 19 mmol/L — ABNORMAL LOW (ref 22–32)
Calcium: 8.1 mg/dL — ABNORMAL LOW (ref 8.9–10.3)
Chloride: 112 mmol/L — ABNORMAL HIGH (ref 98–111)
Creatinine, Ser: 1.95 mg/dL — ABNORMAL HIGH (ref 0.44–1.00)
GFR, Estimated: 28 mL/min — ABNORMAL LOW (ref >60)
Glucose, Bld: 116 mg/dL — ABNORMAL HIGH (ref 70–99)
Potassium: 3.2 mmol/L — ABNORMAL LOW (ref 3.5–5.1)
Sodium: 138 mmol/L (ref 135–145)

## 2022-03-12 LAB — CBC
HCT: 31 % — ABNORMAL LOW (ref 36.0–46.0)
Hemoglobin: 9.8 g/dL — ABNORMAL LOW (ref 12.0–15.0)
MCH: 24.3 pg — ABNORMAL LOW (ref 26.0–34.0)
MCHC: 31.6 g/dL (ref 30.0–36.0)
MCV: 76.9 fL — ABNORMAL LOW (ref 80.0–100.0)
Platelets: 236 10*3/uL (ref 150–400)
RBC: 4.03 MIL/uL (ref 3.87–5.11)
RDW: 18.2 % — ABNORMAL HIGH (ref 11.5–15.5)
WBC: 5.3 10*3/uL (ref 4.0–10.5)
nRBC: 0 % (ref 0.0–0.2)

## 2022-03-12 MED ORDER — POTASSIUM CHLORIDE CRYS ER 20 MEQ PO TBCR
20.0000 meq | EXTENDED_RELEASE_TABLET | Freq: Once | ORAL | Status: AC
  Administered 2022-03-12: 20 meq via ORAL
  Filled 2022-03-12: qty 1

## 2022-03-12 MED ORDER — LACTATED RINGERS IV SOLN: INTRAVENOUS | Status: DC

## 2022-03-12 MED ORDER — HYDRALAZINE HCL 20 MG/ML IJ SOLN
10.0000 mg | Freq: Three times a day (TID) | INTRAMUSCULAR | Status: DC | PRN
  Administered 2022-03-12: 10 mg via INTRAVENOUS
  Filled 2022-03-12: qty 1

## 2022-03-12 NOTE — Progress Notes (Signed)
Progress Note   Patient: Carrie [name] TDH:[id number] DOB: Nov 01, 1955 DOA: 03/11/2022     0 DOS: the patient was seen and examined on 03/12/2022   Brief hospital course: 68yow PMH including diabetes mellitus type 2, cirrhosis, chronic left knee prosthetic joint infection, presented with lightheadedness while shopping.  After multiple episodes she came to the emergency department for further evaluation.  Admitted for acute kidney injury superimposed on CKD, lightheadedness.  Assessment and Plan: * AKI superimposed on CKD IIIa -- BUN and creatinine elevated, somewhat improved today after IV fluids, urine output adequate, renal ultrasound showed normal kidneys. -- Etiology unclear but may be secondary simply to decreased oral intake complicated by Benicar.  She has had substantial weight loss on Ozempic so fluid intake may also be compromised. -- Regardless seems to be responding to fluids.  Continue to hold ARB. --continue IVF, check BMP in AM  Lightheadedness -- Multiple episodes of lightheadedness in the context of prolonged walking.  No orthostatics done on admission that I see. -- In the context of acute kidney injury and history, most likely related to dehydration, vasovagal type phenomenon.  No signs or symptoms to suggest vertigo at this point, no signs or symptoms at this point to suggest acute CNS process.  No further imaging at this time.  Elevated d-dimer --D-dimer was significantly elevated but it is not clear why this test was ordered on review of documentation.  Brief tachycardia was noted in the emergency department although this might be spurious as it is not unconfirmed vital in the record.  On review of her chart I do see elevated D-dimer in 2011 and 2012.  In 2011 at the same time CTA chest was negative for PE.  Appears the patient has a chronic elevation of D-dimer.  Given lack of specific signs or symptoms and chronicity no further testing is recommended.  Microcytic  anemia --Baseline appears to be normocytic around 11 in setting of CKD. Ferritin low but on abx for chronic infection --follow-up as an outpatient  Benign essential HTN --labile; hold ARB, unclear what meds she is on. 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, initial encounter ([place]) --Followed by ID, continue daily amoxicillin and doxycycline   Type 2 diabetes mellitus with complication, without long-term current use of insulin (HCC) --Well controlled, recent A1C in April was 5.8 --Hold metformin with AKI, also on ozempic weekly --SSI and accuchecks per protocol   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[place]) --Compensated --Follows with [facility] for routine monitoring   Anxiety and depression Continue buspar and prozac   GERD with esophageal stricture and stenosis s/p dilation  --Continue with PPI    Psoriatic arthritis ([place]) She is on cosentyx. She has been on maintenance x 3 months   Mild neurocognitive disorder --Continue namenda BID  Hyperlipidemia Continue pravachol         Subjective:  Feels better No dizziness today Dizziness was lightheadedness after walking around for a good while out shopping. Might not be drinking enough Recently saw her nephrologist and was told everything was fine  Physical Exam: Vitals:   03/12/22 0743 03/12/22 0800 03/12/22 0818 03/12/22 1711  BP:   (!) 165/79 (!) 180/79  Pulse:  91  86  Resp:    15  Temp: 98.1 F (36.7 C) 98.1 F (36.7 C)    TempSrc: Oral Oral    SpO2:    99%  Weight:      Height:  Physical Exam Vitals reviewed.  Constitutional:      General: She is not in acute distress.    Appearance: She is not ill-appearing or toxic-appearing.  Cardiovascular:     Rate and Rhythm: Normal rate and regular rhythm.     Heart sounds: No murmur heard.    Comments: SR Pulmonary:     Effort: Pulmonary effort is normal. No respiratory distress.     Breath sounds: No wheezing,  rhonchi or rales.  Neurological:     Mental Status: She is alert.  Psychiatric:        Mood and Affect: Mood normal.        Behavior: Behavior normal.    Data Reviewed: CBG stable K+ 3.2 Creatinine down to 1.95 BUN down to 26 Hgb stable 9.8 CT head nonacute  Family Communication: none  Disposition: Status is: Observation   Planned Discharge Destination: Home    Time spent: 20 minutes  Author: Murray Hodgkins, MD 03/12/2022 5:52 PM  For on call review [email or web address].

## 2022-03-12 NOTE — Progress Notes (Signed)
Pt's BP 180/79. MD notified. Awaiting further orders.

## 2022-03-12 NOTE — Hospital Course (Signed)
57yow PMH including diabetes mellitus type 2, cirrhosis, chronic left knee prosthetic joint infection, presented with lightheadedness while shopping.  After multiple episodes she came to the emergency department for further evaluation.  Admitted for acute kidney injury superimposed on CKD, lightheadedness.

## 2022-03-12 NOTE — Care Management Obs Status (Signed)
[place] NOTIFICATION   Patient Details  Name: Carrie [name] MRN: [id number] Date of Birth: 08/17/1956   Medicare Observation Status Notification Given:  Yes    Bethena Roys, RN 03/12/2022, 3:40 PM

## 2022-03-13 DIAGNOSIS — T8454XA 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, initial encounter: Secondary | ICD-10-CM | POA: Diagnosis present

## 2022-03-13 DIAGNOSIS — Z7982 Long term (current) use of aspirin: Secondary | ICD-10-CM | POA: Diagnosis not present

## 2022-03-13 DIAGNOSIS — L4050 Arthropathic psoriasis, unspecified: Secondary | ICD-10-CM | POA: Diagnosis present

## 2022-03-13 DIAGNOSIS — I129 Hypertensive chronic kidney disease with stage 1 through stage 4 chronic kidney disease, or unspecified chronic kidney disease: Secondary | ICD-10-CM | POA: Diagnosis present

## 2022-03-13 DIAGNOSIS — E860 Dehydration: Secondary | ICD-10-CM | POA: Diagnosis present

## 2022-03-13 DIAGNOSIS — Z7984 Long term (current) use of oral hypoglycemic drugs: Secondary | ICD-10-CM | POA: Diagnosis not present

## 2022-03-13 DIAGNOSIS — Z79899 Other long term (current) drug therapy: Secondary | ICD-10-CM | POA: Diagnosis not present

## 2022-03-13 DIAGNOSIS — F419 Anxiety disorder, unspecified: Secondary | ICD-10-CM | POA: Diagnosis present

## 2022-03-13 DIAGNOSIS — F32A Depression, unspecified: Secondary | ICD-10-CM | POA: Diagnosis present

## 2022-03-13 DIAGNOSIS — D509 Iron deficiency anemia, unspecified: Secondary | ICD-10-CM | POA: Diagnosis present

## 2022-03-13 DIAGNOSIS — E118 Type 2 diabetes mellitus with unspecified complications: Secondary | ICD-10-CM | POA: Diagnosis not present

## 2022-03-13 DIAGNOSIS — N1831 Chronic kidney disease, stage 3a: Secondary | ICD-10-CM | POA: Diagnosis present

## 2022-03-13 DIAGNOSIS — Z79891 Long term (current) use of opiate analgesic: Secondary | ICD-10-CM | POA: Diagnosis not present

## 2022-03-13 DIAGNOSIS — E1143 Type 2 diabetes mellitus with diabetic autonomic (poly)neuropathy: Secondary | ICD-10-CM | POA: Diagnosis present

## 2022-03-13 DIAGNOSIS — K7460 Unspecified cirrhosis of liver: Secondary | ICD-10-CM | POA: Diagnosis present

## 2022-03-13 DIAGNOSIS — M810 Age-related osteoporosis without current pathological fracture: Secondary | ICD-10-CM | POA: Diagnosis present

## 2022-03-13 DIAGNOSIS — Y831 Surgical operation with implant of artificial internal device as the cause of abnormal reaction of the patient, or of later complication, without mention of misadventure at the time of the procedure: Secondary | ICD-10-CM | POA: Diagnosis present

## 2022-03-13 DIAGNOSIS — N179 Acute kidney failure, unspecified: Secondary | ICD-10-CM | POA: Diagnosis present

## 2022-03-13 DIAGNOSIS — R42 Dizziness and giddiness: Secondary | ICD-10-CM

## 2022-03-13 DIAGNOSIS — Z792 Long term (current) use of antibiotics: Secondary | ICD-10-CM | POA: Diagnosis not present

## 2022-03-13 DIAGNOSIS — K219 Gastro-esophageal reflux disease without esophagitis: Secondary | ICD-10-CM | POA: Diagnosis present

## 2022-03-13 DIAGNOSIS — E1122 Type 2 diabetes mellitus with diabetic chronic kidney disease: Secondary | ICD-10-CM | POA: Diagnosis present

## 2022-03-13 DIAGNOSIS — E785 Hyperlipidemia, unspecified: Secondary | ICD-10-CM | POA: Diagnosis present

## 2022-03-13 DIAGNOSIS — K3184 Gastroparesis: Secondary | ICD-10-CM | POA: Diagnosis present

## 2022-03-13 DIAGNOSIS — E1140 Type 2 diabetes mellitus with diabetic neuropathy, unspecified: Secondary | ICD-10-CM | POA: Diagnosis present

## 2022-03-13 DIAGNOSIS — Z7985 Long-term (current) use of injectable non-insulin antidiabetic drugs: Secondary | ICD-10-CM | POA: Diagnosis not present

## 2022-03-13 LAB — BASIC METABOLIC PANEL
Anion gap: 5 (ref 5–15)
BUN: 16 mg/dL (ref 8–23)
CO2: 20 mmol/L — ABNORMAL LOW (ref 22–32)
Calcium: 8.6 mg/dL — ABNORMAL LOW (ref 8.9–10.3)
Chloride: 110 mmol/L (ref 98–111)
Creatinine, Ser: 1.18 mg/dL — ABNORMAL HIGH (ref 0.44–1.00)
GFR, Estimated: 52 mL/min — ABNORMAL LOW (ref >60)
Glucose, Bld: 103 mg/dL — ABNORMAL HIGH (ref 70–99)
Potassium: 3.5 mmol/L (ref 3.5–5.1)
Sodium: 135 mmol/L (ref 135–145)

## 2022-03-13 LAB — GLUCOSE, CAPILLARY
Glucose-Capillary: 102 mg/dL — ABNORMAL HIGH (ref 70–99)
Glucose-Capillary: 115 mg/dL — ABNORMAL HIGH (ref 70–99)

## 2022-03-13 NOTE — Discharge Summary (Signed)
Physician Discharge Summary   Patient: Carrie [name] MRN: [id number] DOB: [birth year]  Admit date:     03/11/2022  Discharge date: 03/13/22  Discharge Physician: Murray Hodgkins   PCP: Jenel Lucks, PA-C   Recommendations at discharge:   Microcytic anemia --follow-up as an outpatient  Discharge Diagnoses: Principal Problem:   AKI superimposed on CKD IIIa Active Problems:   Lightheadedness   Elevated d-dimer   Microcytic anemia   Benign essential HTN   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, initial encounter ([place])   Type 2 diabetes mellitus with complication, without long-term current use of insulin ([place])   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[place])   Anxiety and depression   GERD with esophageal stricture and stenosis s/p dilation    Psoriatic arthritis ([place])   Mild neurocognitive disorder   Hyperlipidemia  Resolved Problems:   * No resolved hospital problems. [facility] Course: 66yow PMH including diabetes mellitus type 2, cirrhosis, chronic left knee prosthetic joint infection, presented with lightheadedness while shopping.  After multiple episodes she came to the emergency department for further evaluation.  Admitted for acute kidney injury superimposed on CKD, lightheadedness. Treated with IVF with resolution of AKI and lightheadedness. Hospitalization uncomplicated. Discharged home in good condition.  * AKI superimposed on CKD IIIa -- BUN and creatinine elevated on amdission, s urine output adequate, renal ultrasound showed normal kidneys. -- Most likely secondary to decreased oral intake complicated by Benicar.  She has had substantial weight loss on Ozempic so fluid intake may also be compromised. -- Regardless responded to fluids.  Resume ARB.  Encourage fluids.  Lightheadedness -- Multiple episodes of lightheadedness in the context of prolonged walking.  No orthostatics done on admission that I see. -- In the  context of acute kidney injury and history, most likely related to dehydration, vasovagal type phenomenon.  No signs or symptoms to suggest vertigo,  no signs or symptoms at this point to suggest acute CNS process.  No further imaging at this time.  Elevated d-dimer --D-dimer was significantly elevated but it is not clear why this test was ordered on review of documentation.  Brief tachycardia was noted in the emergency department although this might be spurious as it is an unconfirmed vital in the record.  On review of her chart I do see elevated D-dimer in 2011 and 2012.  In 2011 at the same time CTA chest was negative for PE.  Appears the patient has a chronic elevation of D-dimer.  Given lack of specific signs or symptoms and chronicity no further testing is recommended.  Microcytic anemia --Baseline appears to be normocytic around 11 in setting of CKD. Ferritin low but on abx for chronic infection --follow-up as an outpatient  Benign essential HTN --labile; hold ARB, unclear what meds she is on. 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, initial encounter ([place]) --Followed by ID, continue daily amoxicillin and doxycycline   Type 2 diabetes mellitus with complication, without long-term current use of insulin ([facility]) --Well controlled, recent A1C in April was 5.8 --Held metformin with AKI, also on ozempic weekly  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[place]) --Compensated --Follows with [facility] for routine monitoring   Anxiety and depression Continue Buspar and prozac   GERD with esophageal stricture and stenosis s/p dilation  --Continue with PPI   Psoriatic arthritis ([place]) She is on Cosentyx. She has been on maintenance x 3 months   Mild  neurocognitive disorder --Continue namenda BID  Hyperlipidemia Continue pravachol         Consultants:  None  Procedures performed:  None   Disposition: Home Diet recommendation:  Discharge Diet  Orders (From admission, onward)     Start     Ordered   03/13/22 0000  Diet - low sodium heart healthy        03/13/22 1158           Regular diet DISCHARGE MEDICATION: Allergies as of 03/13/2022       Reactions   Lisinopril Cough        Medication List     STOP taking these medications    HYDROcodone-acetaminophen 5-325 MG tablet Commonly known as: NORCO/VICODIN   lactulose 10 GM/15ML solution Commonly known as: CHRONULAC   sertraline 50 MG tablet Commonly known as: ZOLOFT   tiZANidine 4 MG tablet Commonly known as: Zanaflex       TAKE these medications    Accu-Chek Guide Me w/Device Kit Use as instructed to check blood sugar 2 times daily Dx E11.9   Accu-Chek Guide test strip Generic drug: glucose blood USE AS INSTRUCTED TO CHECK BLOOD SUGAR 2 TIMES DAILY   Accu-Chek Softclix Lancets lancets Use as instructed to check blood sugar 2 times daily Dx E11.9   albuterol 108 (90 Base) MCG/ACT inhaler Commonly known as: VENTOLIN HFA Inhale 2 puffs into the lungs every 4 (four) hours as needed for wheezing or shortness of breath.   amLODipine 5 MG tablet Commonly known as: NORVASC Take 1 tablet (5 mg total) by mouth daily.   amoxicillin 500 MG capsule Commonly known as: AMOXIL Take 1 capsule (500 mg total) by mouth 3 (three) times daily.   aspirin EC 81 MG tablet Take 1 tablet (81 mg total) by mouth 2 (two) times daily after a meal. What changed: when to take this   B-D SINGLE USE SWABS REGULAR Pads 1 each by Does not apply route 2 (two) times daily. Cleanse site BID for glucose testing; E11.9   busPIRone 5 MG tablet Commonly known as: BUSPAR Take 5 mg by mouth 2 (two) times daily.   Cosentyx Sensoready (300 MG) 150 MG/ML [place] Generic drug: Secukinumab (300 MG Dose) Inject 300 mg into the skin every 30 (thirty) days.   doxycycline 100 MG tablet Commonly known as: VIBRA-TABS Take 1 tablet (100 mg total) by mouth 2 (two) times daily.    esomeprazole 40 MG capsule Commonly known as: NEXIUM Take 40 mg by mouth every morning.   Flovent HFA 110 MCG/ACT inhaler Generic drug: fluticasone Inhale 2 puffs into the lungs in the morning and at bedtime.   FLUoxetine 20 MG capsule Commonly known as: PROZAC Take 20 mg by mouth daily.   memantine 10 MG tablet Commonly known as: NAMENDA Take 10 mg by mouth 2 (two) times daily.   metFORMIN 500 MG 24 hr tablet Commonly known as: GLUCOPHAGE-XR Take 1 tablet (500 mg total) by mouth daily.   olmesartan 40 MG tablet Commonly known as: BENICAR Take 1 tablet (40 mg total) by mouth daily. Please hold this medication, repeat kidney function at your pcp 's office , pcp to decide on when to resume olmesartan.   ondansetron 4 MG tablet Commonly known as: Zofran Take 1 tablet (4 mg total) by mouth every 8 (eight) hours as needed for nausea or vomiting.   Ozempic (1 MG/DOSE) 4 MG/3ML Sopn Generic drug: Semaglutide (1 MG/DOSE) Inject 1 mg as directed once a week.  pravastatin 40 MG tablet Commonly known as: PRAVACHOL Take 40 mg by mouth daily.   primidone 250 MG tablet Commonly known as: MYSOLINE Take 250 mg by mouth at bedtime.   traMADol 50 MG tablet Commonly known as: ULTRAM Take 50 mg by mouth 2 (two) times daily as needed for moderate pain.        Follow-up Information     Jenel Lucks, PA-C Follow up.   Specialty: Internal Medicine Why: As needed Contact information: [address] [place] [address] [phone]                Feels better No dizziness  Discharge Exam: Filed Weights   03/11/22 1515 03/11/22 2100  Weight: 57.6 kg 55.3 kg   Physical Exam Vitals reviewed.  Constitutional:      General: She is not in acute distress.    Appearance: She is not ill-appearing or toxic-appearing.  Cardiovascular:     Rate and Rhythm: Normal rate and regular rhythm.     Heart sounds: No murmur heard.    Comments: Telemetry  SR Neurological:     Mental Status: She is alert.  Psychiatric:        Mood and Affect: Mood normal.        Behavior: Behavior normal.     Condition at discharge: good  The results of significant diagnostics from this hospitalization (including imaging, microbiology, ancillary and laboratory) are listed below for reference.   Imaging Studies: DG Thoracic Spine 2 View  Result Date: 02/18/2022 CLINICAL DATA:  Pain after fall today. EXAM: THORACIC SPINE 2 VIEWS COMPARISON:  None Available. FINDINGS: The upper aspect of the thoracic spine is not included in the field of view on the lateral. Allowing for this, there is no evidence of fracture. The alignment is maintained. Vertebral body heights are maintained. Minor anterior spurring in the midthoracic spine with preservation of disc spaces. Posterior elements appear intact. The bones are diffusely under mineralized. There is no paravertebral soft tissue abnormality. IMPRESSION: 1. No acute fracture of the thoracic spine. 2. Minor degenerative change and osteopenia. Electronically Signed   By: Keith Rake M.D.   On: 02/18/2022 22:42   DG Elbow Complete Left  Result Date: 02/18/2022 CLINICAL DATA:  Pain after fall today. EXAM: LEFT ELBOW - COMPLETE 3+ VIEW COMPARISON:  None Available. FINDINGS: Technically limited exam due to difficulties with positioning due to pain. No fracture or dislocation. No elbow joint effusion. There is mild soft tissue edema and skin irregularity posteriorly. IMPRESSION: No fracture or dislocation of the left elbow. Mild soft tissue edema and skin irregularity posteriorly. Electronically Signed   By: Keith Rake M.D.   On: 02/18/2022 22:40   CT Head Wo Contrast  Result Date: 03/11/2022 CLINICAL DATA:  Acute neuro deficit.  Stroke suspected. EXAM: CT HEAD WITHOUT CONTRAST TECHNIQUE: Contiguous axial images were obtained from the base of the skull through the vertex without intravenous contrast. RADIATION DOSE  REDUCTION: This exam was performed according to the departmental dose-optimization program which includes automated exposure control, adjustment of the mA and/or kV according to patient size and/or use of iterative reconstruction technique. COMPARISON:  CT brain 02/18/2022 and 05/27/2018 FINDINGS: Brain: There is mild cortical atrophy, within normal limits for patient age. The ventricles are normal in configuration. The basilar cisterns are patent. No significant change in small CSF density region lateral speech basal ganglia, compatible with Virchow-Robin perivascular spaces. No mass, mass effect, or midline shift. No acute intracranial hemorrhage is  seen. No abnormal extra-axial fluid collection. Mild periventricular white matter hypodensities are similar to prior, likely chronic ischemic white matter changes. Preservation of the normal cortical gray-white interface without CT evidence of an acute major vascular territorial cortical based infarction. Vascular: No hyperdense vessel or unexpected calcification. Skull: Normal. Negative for fracture or focal lesion. Sinuses/Orbits: The visualized orbits are unremarkable. Mild posterior right sphenoid sinus mucosal opacification. The visualized mastoid air cells are clear. Other: None. IMPRESSION: 1. No significant change from prior. Mild cortical atrophy and mild chronic ischemic white matter changes. 2. No CT evidence of acute intracranial process. Electronically Signed   By: Yvonne Kendall M.D.   On: 03/11/2022 15:45   CT HEAD WO CONTRAST (5MM)  Result Date: 02/18/2022 CLINICAL DATA:  Head trauma, minor, normal mental status (Age 68-64y). Fall. EXAM: CT HEAD WITHOUT CONTRAST TECHNIQUE: Contiguous axial images were obtained from the base of the skull through the vertex without intravenous contrast. RADIATION DOSE REDUCTION: This exam was performed according to the departmental dose-optimization program which includes automated exposure control, adjustment of the mA  and/or kV according to patient size and/or use of iterative reconstruction technique. COMPARISON:  04/10/2021 FINDINGS: Brain: There is atrophy and chronic small vessel disease changes. No acute intracranial abnormality. Specifically, no hemorrhage, hydrocephalus, mass lesion, acute infarction, or significant intracranial injury. Vascular: No hyperdense vessel or unexpected calcification. Skull: No acute calvarial abnormality. Sinuses/Orbits: No acute findings Other: None IMPRESSION: Atrophy, chronic microvascular disease. No acute intracranial abnormality. Electronically Signed   By: Rolm Baptise M.D.   On: 02/18/2022 22:49   US RENAL  Result Date: 03/11/2022 CLINICAL DATA:  Acute kidney injury. EXAM: RENAL / URINARY TRACT ULTRASOUND COMPLETE COMPARISON:  Renal ultrasound 04/11/2021. CT abdomen and pelvis 04/10/2021. FINDINGS: Right Kidney: Renal measurements: 9.7 x 4.4 x 5.2 cm = volume: 114 mL. Echogenicity within normal limits. No mass or hydronephrosis visualized. Left Kidney: Renal measurements: 9.0 x 4.2 x 3.9 cm = volume: 78 mL. Echogenicity within normal limits. No mass or hydronephrosis visualized. Bladder: Appears normal for degree of bladder distention. Other: None. IMPRESSION: Normal renal ultrasound. Electronically Signed   By: Ronney Asters M.D.   On: 03/11/2022 20:24   DG Chest Portable 1 View  Result Date: 03/11/2022 CLINICAL DATA:  Dizziness, visual changes EXAM: PORTABLE CHEST 1 VIEW COMPARISON:  04/10/2021 FINDINGS: Single frontal view of the chest demonstrates an unremarkable cardiac silhouette. Background emphysema without airspace disease, effusion, or pneumothorax. No acute bony abnormalities. IMPRESSION: 1. No acute intrathoracic process. Electronically Signed   By: Randa Ngo M.D.   On: 03/11/2022 16:03   DG Knee Complete 4 Views Left  Result Date: 02/18/2022 CLINICAL DATA:  Pain after fall today. EXAM: LEFT KNEE - COMPLETE 4+ VIEW COMPARISON:  None Available. FINDINGS: Left  knee arthroplasty in expected alignment. There is no periprosthetic lucency or fracture. There has been patellar resurfacing there is a small knee joint effusion. The bones are under mineralized. IMPRESSION: 1. Left knee arthroplasty without acute fracture or dislocation. 2. Small knee joint effusion. Electronically Signed   By: Keith Rake M.D.   On: 02/18/2022 22:38    Microbiology: Results for orders placed or performed during the hospital encounter of 10/03/21  Aerobic/Anaerobic Culture w Gram Stain (surgical/deep wound)     Status: None   Collection Time: 10/03/21 10:38 AM   Specimen: PATH Cytology Misc. fluid; Body Fluid  Result Value Ref Range Status   Specimen Description   Final    SYNOVIAL LT KNEE Performed  at [facility], [place] [address], [place], [place] [address]    Special Requests   Final    NONE Performed at [facility], [place] [address] [place], [place] [address]    Gram Stain   Final    FEW WBC PRESENT, PREDOMINANTLY MONONUCLEAR RARE GRAM POSITIVE COCCI    Culture   Final    No growth aerobically or anaerobically. Performed at [facility], [place] [address] [place], [place] [address]    Report Status 10/08/2021 FINAL  Final    Labs: CBC: Recent Labs  Lab 03/11/22 1555 03/11/22 1623 03/12/22 0109  WBC 6.2  --  5.3  NEUTROABS 3.0  --   --   HGB 11.0* 11.9* 9.8*  HCT 35.9* 35.0* 31.0*  MCV 79.1*  --  76.9*  PLT 282  --  438   Basic Metabolic Panel: Recent Labs  Lab 03/11/22 1555 03/11/22 1623 03/12/22 0109 03/13/22 0905  NA 140 142 138 135  K 3.6 3.5 3.2* 3.5  CL 112* 112* 112* 110  CO2 17*  --  19* 20*  GLUCOSE 83 82 116* 103*  BUN 33* 33* 26* 16  CREATININE 2.60* 2.70* 1.95* 1.18*  CALCIUM 8.6*  --  8.1* 8.6*   Liver Function Tests: Recent Labs  Lab 03/11/22 1555  AST 31  ALT 16  ALKPHOS 75  BILITOT 0.7  PROT 7.6  ALBUMIN 3.3*   CBG: Recent Labs  Lab 03/12/22 1518 03/12/22 1741 03/12/22 2103  03/13/22 0759 03/13/22 1149  GLUCAP 123* 145* 122* 102* 115*    Discharge time spent: less than 30 minutes.  Signed: Murray Hodgkins, MD [facility] 03/13/2022

## 2022-03-21 ENCOUNTER — Ambulatory Visit: Payer: Medicare Other | Admitting: Endocrinology

## 2022-03-23 ENCOUNTER — Other Ambulatory Visit: Payer: Self-pay | Admitting: Orthopedic Surgery

## 2022-03-27 ENCOUNTER — Other Ambulatory Visit: Payer: Self-pay

## 2022-03-27 ENCOUNTER — Encounter (HOSPITAL_COMMUNITY): Payer: Self-pay | Admitting: Orthopedic Surgery

## 2022-03-27 NOTE — Progress Notes (Signed)
Cardiologist - n/a Endocrinology - Dr Renato Shin PCP/Internal [facility] - Cathi Roan, PA-C Neurology - Dr Creig Hines  Chest x-ray - 03/11/22 (1V) EKG - 03/11/22 Stress Test - 12/05/13 ECHO - 05/10/21 CE Cardiac Cath - 08/30/10  ICD Pacemaker/Loop - n/a  Sleep Study -  n/a CPAP - none  Do not take Metformin and Ozempic on the morning of surgery..  If your blood sugar is less than 70 mg/dL, you will need to treat for low blood sugar: Treat a low blood sugar (less than 70 mg/dL) with  cup of clear juice (cranberry or apple), 4 glucose tablets, OR glucose gel. Recheck blood sugar in 15 minutes after treatment (to make sure it is greater than 70 mg/dL). If your blood sugar is not greater than 70 mg/dL on recheck, call [phone] for further instructions.  Aspirin Instructions: Last dose was on 03/22/22.  ERAS: Clear liquids til 12:30 PM DOS.  Anesthesia review: Yes  STOP now taking any Aspirin (unless otherwise instructed by your surgeon), Aleve, Naproxen, Ibuprofen, Motrin, Advil, Goody's, BC's, all herbal medications, fish oil, and all vitamins.   Coronavirus Screening Do you have any of the following symptoms:  Cough yes/no: No Fever (>100.61F)  yes/no: No Runny nose yes/no: No Sore throat yes/no: No Difficulty breathing/shortness of breath  yes/no: No  Have you traveled in the last 14 days and where? yes/no: No  Patient verbalized understanding of instructions that were given via phone.
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
## 2022-03-27 NOTE — Anesthesia Preprocedure Evaluation (Signed)
Anesthesia Evaluation  Patient identified by MRN, date of birth, ID band Patient awake    Reviewed: Allergy & Precautions, NPO status , Patient's Chart, lab work & pertinent test results  Airway Mallampati: III  TM Distance: >3 FB Neck ROM: Full    Dental  (+) Edentulous Upper, Edentulous Lower   Pulmonary asthma ,  Uses inhaler 2-3x/mo   Pulmonary exam normal breath sounds clear to auscultation       Cardiovascular hypertension (has been off antihypertensives for a month now d/t decreasing kidney function, BP 173/86 in preop), Normal cardiovascular exam Rhythm:Regular Rate:Normal     Neuro/Psych PSYCHIATRIC DISORDERS Anxiety Depression TIACVA    GI/Hepatic Neg liver ROS, GERD  Medicated and Controlled,  Endo/Other  diabetes, Well Controlled, Type 2, Oral Hypoglycemic AgentsFS 139 in preop last a1c 5.8  Renal/GU Renal InsufficiencyRenal diseaseLast Cr this month 1.18 Admission 03/11/22-03/03/22 for AKI superimposed on CKD IIIa. Creatinine peak at 2.70.   negative genitourinary   Musculoskeletal  (+) Arthritis , Osteoarthritis,    Abdominal   Peds  Hematology negative hematology ROS (+)   Anesthesia Other Findings   Reproductive/Obstetrics negative OB ROS                            Anesthesia Physical Anesthesia Plan  ASA: 2  Anesthesia Plan: General   Post-op Pain Management: Ofirmev IV (intra-op)*   Induction: Intravenous  PONV Risk Score and Plan: 3 and Ondansetron, Dexamethasone, Midazolam and Treatment may vary due to age or medical condition  Airway Management Planned: Oral ETT  Additional Equipment: None  Intra-op Plan:   Post-operative Plan: Extubation in OR  Informed Consent: I have reviewed the patients History and Physical, chart, labs and discussed the procedure including the risks, benefits and alternatives for the proposed anesthesia with the patient or authorized  representative who has indicated his/her understanding and acceptance.     Dental advisory given  Plan Discussed with: CRNA  Anesthesia Plan Comments:        Anesthesia Quick Evaluation

## 2022-03-28 ENCOUNTER — Other Ambulatory Visit: Payer: Self-pay

## 2022-03-28 ENCOUNTER — Ambulatory Visit (HOSPITAL_COMMUNITY)
Admission: RE | Admit: 2022-03-28 | Discharge: 2022-03-28 | Disposition: A | Discharge status: Home / Self Care | Payer: Medicare Other | Attending: Orthopedic Surgery | Admitting: Orthopedic Surgery

## 2022-03-28 ENCOUNTER — Ambulatory Visit (HOSPITAL_BASED_OUTPATIENT_CLINIC_OR_DEPARTMENT_OTHER): Payer: Medicare Other | Admitting: Physician Assistant

## 2022-03-28 ENCOUNTER — Ambulatory Visit (HOSPITAL_COMMUNITY): Payer: Medicare Other

## 2022-03-28 ENCOUNTER — Ambulatory Visit (HOSPITAL_COMMUNITY): Payer: Medicare Other | Admitting: Physician Assistant

## 2022-03-28 ENCOUNTER — Encounter (HOSPITAL_COMMUNITY): Payer: Self-pay | Admitting: Orthopedic Surgery

## 2022-03-28 ENCOUNTER — Encounter (HOSPITAL_COMMUNITY)
Admission: RE | Disposition: A | Discharge status: Home / Self Care | Payer: Self-pay | Source: Home / Self Care | Attending: Orthopedic Surgery

## 2022-03-28 DIAGNOSIS — M8008XA Age-related osteoporosis with current pathological fracture, vertebra(e), initial encounter for fracture: Secondary | ICD-10-CM | POA: Insufficient documentation

## 2022-03-28 DIAGNOSIS — K3184 Gastroparesis: Secondary | ICD-10-CM | POA: Insufficient documentation

## 2022-03-28 DIAGNOSIS — S32010G Wedge compression fracture of first lumbar vertebra, subsequent encounter for fracture with delayed healing: Secondary | ICD-10-CM

## 2022-03-28 DIAGNOSIS — Z8673 Personal history of transient ischemic attack (TIA), and cerebral infarction without residual deficits: Secondary | ICD-10-CM | POA: Insufficient documentation

## 2022-03-28 DIAGNOSIS — N1831 Chronic kidney disease, stage 3a: Secondary | ICD-10-CM | POA: Diagnosis not present

## 2022-03-28 DIAGNOSIS — J45909 Unspecified asthma, uncomplicated: Secondary | ICD-10-CM | POA: Diagnosis not present

## 2022-03-28 DIAGNOSIS — M8088XA Other osteoporosis with current pathological fracture, vertebra(e), initial encounter for fracture: Secondary | ICD-10-CM | POA: Diagnosis not present

## 2022-03-28 DIAGNOSIS — Z9049 Acquired absence of other specified parts of digestive tract: Secondary | ICD-10-CM | POA: Insufficient documentation

## 2022-03-28 DIAGNOSIS — K7469 Other cirrhosis of liver: Secondary | ICD-10-CM | POA: Diagnosis not present

## 2022-03-28 DIAGNOSIS — E1143 Type 2 diabetes mellitus with diabetic autonomic (poly)neuropathy: Secondary | ICD-10-CM | POA: Insufficient documentation

## 2022-03-28 DIAGNOSIS — F419 Anxiety disorder, unspecified: Secondary | ICD-10-CM | POA: Insufficient documentation

## 2022-03-28 DIAGNOSIS — M1990 Unspecified osteoarthritis, unspecified site: Secondary | ICD-10-CM | POA: Insufficient documentation

## 2022-03-28 DIAGNOSIS — E785 Hyperlipidemia, unspecified: Secondary | ICD-10-CM | POA: Diagnosis not present

## 2022-03-28 DIAGNOSIS — F32A Depression, unspecified: Secondary | ICD-10-CM | POA: Insufficient documentation

## 2022-03-28 DIAGNOSIS — K219 Gastro-esophageal reflux disease without esophagitis: Secondary | ICD-10-CM | POA: Diagnosis not present

## 2022-03-28 DIAGNOSIS — E1122 Type 2 diabetes mellitus with diabetic chronic kidney disease: Secondary | ICD-10-CM | POA: Insufficient documentation

## 2022-03-28 DIAGNOSIS — Z96653 Presence of artificial knee joint, bilateral: Secondary | ICD-10-CM | POA: Diagnosis not present

## 2022-03-28 DIAGNOSIS — I129 Hypertensive chronic kidney disease with stage 1 through stage 4 chronic kidney disease, or unspecified chronic kidney disease: Secondary | ICD-10-CM | POA: Diagnosis not present

## 2022-03-28 DIAGNOSIS — L4050 Arthropathic psoriasis, unspecified: Secondary | ICD-10-CM | POA: Diagnosis not present

## 2022-03-28 DIAGNOSIS — Z7984 Long term (current) use of oral hypoglycemic drugs: Secondary | ICD-10-CM | POA: Diagnosis not present

## 2022-03-28 HISTORY — PX: KYPHOPLASTY: SHX5884

## 2022-03-28 LAB — GLUCOSE, CAPILLARY
Glucose-Capillary: 139 mg/dL — ABNORMAL HIGH (ref 70–99)
Glucose-Capillary: 116 mg/dL — ABNORMAL HIGH (ref 70–99)

## 2022-03-28 LAB — SURGICAL PCR SCREEN
MRSA, PCR: NEGATIVE
Staphylococcus aureus: NEGATIVE

## 2022-03-28 SURGERY — KYPHOPLASTY
Anesthesia: General

## 2022-03-28 MED ORDER — POVIDONE-IODINE 7.5 % EX SOLN: Freq: Once | CUTANEOUS | Status: DC

## 2022-03-28 MED ORDER — IOPAMIDOL (ISOVUE-300) INJECTION 61%
INTRAVENOUS | Status: DC | PRN
  Administered 2022-03-28: 100 mL

## 2022-03-28 MED ORDER — LIDOCAINE 2% (20 MG/ML) 5 ML SYRINGE
INTRAMUSCULAR | Status: DC | PRN
  Administered 2022-03-28: 40 mg via INTRAVENOUS

## 2022-03-28 MED ORDER — FENTANYL CITRATE (PF) 250 MCG/5ML IJ SOLN
INTRAMUSCULAR | Status: DC | PRN
  Administered 2022-03-28: 25 ug via INTRAVENOUS
  Administered 2022-03-28: 75 ug via INTRAVENOUS

## 2022-03-28 MED ORDER — MIDAZOLAM HCL 2 MG/2ML IJ SOLN
INTRAMUSCULAR | Status: AC
Start: 2022-03-28 — End: ?
  Filled 2022-03-28: qty 2

## 2022-03-28 MED ORDER — BUPIVACAINE-EPINEPHRINE (PF) 0.25% -1:200000 IJ SOLN
INTRAMUSCULAR | Status: DC | PRN
  Administered 2022-03-28: 6 mL

## 2022-03-28 MED ORDER — CEFAZOLIN SODIUM-DEXTROSE 2-4 GM/100ML-% IV SOLN
2.0000 g | INTRAVENOUS | Status: AC
  Administered 2022-03-28: 2 g via INTRAVENOUS
  Filled 2022-03-28: qty 100

## 2022-03-28 MED ORDER — CHLORHEXIDINE GLUCONATE 0.12 % MT SOLN
15.0000 mL | Freq: Once | OROMUCOSAL | Status: AC
  Administered 2022-03-28: 15 mL via OROMUCOSAL
  Filled 2022-03-28: qty 15

## 2022-03-28 MED ORDER — HYDROMORPHONE HCL 1 MG/ML IJ SOLN: 0.2500 mg | INTRAMUSCULAR | Status: DC | PRN

## 2022-03-28 MED ORDER — HYDROCODONE-ACETAMINOPHEN 5-325 MG PO TABS: 1.0000 | ORAL_TABLET | Freq: Four times a day (QID) | ORAL | 0 refills | Status: DC | PRN

## 2022-03-28 MED ORDER — EPHEDRINE SULFATE-NACL 50-0.9 MG/10ML-% IV SOSY
PREFILLED_SYRINGE | INTRAVENOUS | Status: DC | PRN
  Administered 2022-03-28: 5 mg via INTRAVENOUS

## 2022-03-28 MED ORDER — OXYCODONE HCL 5 MG PO TABS: 5.0000 mg | ORAL_TABLET | Freq: Once | ORAL | Status: DC | PRN

## 2022-03-28 MED ORDER — MIDAZOLAM HCL 2 MG/2ML IJ SOLN
INTRAMUSCULAR | Status: DC | PRN
  Administered 2022-03-28: 1 mg via INTRAVENOUS

## 2022-03-28 MED ORDER — ROCURONIUM BROMIDE 10 MG/ML (PF) SYRINGE
PREFILLED_SYRINGE | INTRAVENOUS | Status: DC | PRN
  Administered 2022-03-28: 40 mg via INTRAVENOUS

## 2022-03-28 MED ORDER — ONDANSETRON HCL 4 MG/2ML IJ SOLN: 4.0000 mg | Freq: Once | INTRAMUSCULAR | Status: DC | PRN

## 2022-03-28 MED ORDER — METHOCARBAMOL 750 MG PO TABS: 750.0000 mg | ORAL_TABLET | Freq: Four times a day (QID) | ORAL | 0 refills | Status: DC | PRN

## 2022-03-28 MED ORDER — BUPIVACAINE-EPINEPHRINE (PF) 0.25% -1:200000 IJ SOLN
INTRAMUSCULAR | Status: AC
  Filled 2022-03-28: qty 30

## 2022-03-28 MED ORDER — FENTANYL CITRATE (PF) 250 MCG/5ML IJ SOLN
INTRAMUSCULAR | Status: AC
  Filled 2022-03-28: qty 5

## 2022-03-28 MED ORDER — DEXAMETHASONE SODIUM PHOSPHATE 10 MG/ML IJ SOLN
INTRAMUSCULAR | Status: DC | PRN
  Administered 2022-03-28: 10 mg via INTRAVENOUS

## 2022-03-28 MED ORDER — SUGAMMADEX SODIUM 200 MG/2ML IV SOLN
INTRAVENOUS | Status: DC | PRN
  Administered 2022-03-28: 200 mg via INTRAVENOUS

## 2022-03-28 MED ORDER — PROPOFOL 10 MG/ML IV BOLUS
INTRAVENOUS | Status: AC
  Filled 2022-03-28: qty 20

## 2022-03-28 MED ORDER — AMISULPRIDE (ANTIEMETIC) 5 MG/2ML IV SOLN: 10.0000 mg | Freq: Once | INTRAVENOUS | Status: DC | PRN

## 2022-03-28 MED ORDER — LABETALOL HCL 5 MG/ML IV SOLN
5.0000 mg | INTRAVENOUS | Status: AC | PRN
  Administered 2022-03-28 (×4): 5 mg via INTRAVENOUS

## 2022-03-28 MED ORDER — OXYCODONE HCL 5 MG/5ML PO SOLN: 5.0000 mg | Freq: Once | ORAL | Status: DC | PRN

## 2022-03-28 MED ORDER — ONDANSETRON HCL 4 MG/2ML IJ SOLN
INTRAMUSCULAR | Status: DC | PRN
  Administered 2022-03-28: 4 mg via INTRAVENOUS

## 2022-03-28 MED ORDER — PROPOFOL 10 MG/ML IV BOLUS
INTRAVENOUS | Status: DC | PRN
  Administered 2022-03-28: 40 mg via INTRAVENOUS
  Administered 2022-03-28: 60 mg via INTRAVENOUS

## 2022-03-28 MED ORDER — BACITRACIN 500 UNIT/GM EX OINT
TOPICAL_OINTMENT | CUTANEOUS | Status: DC | PRN
  Administered 2022-03-28: 1 via TOPICAL

## 2022-03-28 MED ORDER — PHENYLEPHRINE 80 MCG/ML (10ML) SYRINGE FOR IV PUSH (FOR BLOOD PRESSURE SUPPORT)
PREFILLED_SYRINGE | INTRAVENOUS | Status: DC | PRN
  Administered 2022-03-28 (×2): 80 ug via INTRAVENOUS

## 2022-03-28 MED ORDER — ORAL CARE MOUTH RINSE: 15.0000 mL | Freq: Once | OROMUCOSAL | Status: AC

## 2022-03-28 MED ORDER — LACTATED RINGERS IV SOLN: INTRAVENOUS | Status: DC

## 2022-03-28 MED ORDER — 0.9 % SODIUM CHLORIDE (POUR BTL) OPTIME
TOPICAL | Status: DC | PRN
  Administered 2022-03-28: 1000 mL

## 2022-03-28 MED ORDER — LABETALOL HCL 5 MG/ML IV SOLN
INTRAVENOUS | Status: AC
  Filled 2022-03-28: qty 4

## 2022-03-28 SURGICAL SUPPLY — 51 items
BAG COUNTER SPONGE SURGICOUNT (BAG) ×2 IMPLANT
BAG SPNG CNTER NS LX DISP (BAG) ×1
BLADE SURG 15 STRL LF DISP TIS (BLADE) ×1 IMPLANT
BLADE SURG 15 STRL SS (BLADE) ×2
CEMENT KYPHON CX01A KIT/MIXER (Cement) ×1 IMPLANT
COVER MAYO STAND STRL (DRAPES) ×2 IMPLANT
COVER SURGICAL LIGHT HANDLE (MISCELLANEOUS) ×2 IMPLANT
CURETTE EXPRESS SZ2 7MM (INSTRUMENTS) IMPLANT
CURRETTE EXPRESS SZ2 7MM (INSTRUMENTS)
DRAPE C-ARM 42X72 X-RAY (DRAPES) ×2 IMPLANT
DRAPE HALF SHEET 40X57 (DRAPES) IMPLANT
DRAPE INCISE IOBAN 66X45 STRL (DRAPES) ×2 IMPLANT
DRAPE LAPAROTOMY T 102X78X121 (DRAPES) ×2 IMPLANT
DRAPE SURG 17X23 STRL (DRAPES) ×8 IMPLANT
DRAPE WARM FLUID 44X44 (DRAPES) ×2 IMPLANT
DURAPREP 26ML APPLICATOR (WOUND CARE) ×2 IMPLANT
GAUZE 4X4 16PLY ~~LOC~~+RFID DBL (SPONGE) ×2 IMPLANT
GAUZE SPONGE 2X2 8PLY STRL LF (GAUZE/BANDAGES/DRESSINGS) ×1 IMPLANT
GLOVE BIO SURGEON STRL SZ7 (GLOVE) ×2 IMPLANT
GLOVE BIO SURGEON STRL SZ8 (GLOVE) ×2 IMPLANT
GLOVE BIOGEL PI IND STRL 7.0 (GLOVE) ×1 IMPLANT
GLOVE BIOGEL PI IND STRL 8 (GLOVE) ×1 IMPLANT
GLOVE BIOGEL PI INDICATOR 7.0 (GLOVE) ×1
GLOVE BIOGEL PI INDICATOR 8 (GLOVE) ×1
GLOVE SURG ENC MOIS LTX SZ6.5 (GLOVE) ×2 IMPLANT
GOWN STRL REUS W/ TWL LRG LVL3 (GOWN DISPOSABLE) ×2 IMPLANT
GOWN STRL REUS W/ TWL XL LVL3 (GOWN DISPOSABLE) ×1 IMPLANT
GOWN STRL REUS W/TWL LRG LVL3 (GOWN DISPOSABLE) ×4
GOWN STRL REUS W/TWL XL LVL3 (GOWN DISPOSABLE) ×2
INTRODUCER DEVICE OSTEO LEVEL (INTRODUCER) ×1 IMPLANT
KIT BASIN OR (CUSTOM PROCEDURE TRAY) ×2 IMPLANT
KIT TURNOVER KIT B (KITS) ×2 IMPLANT
NDL HYPO 25X1 1.5 SAFETY (NEEDLE) ×1 IMPLANT
NDL SPNL 18GX3.5 QUINCKE PK (NEEDLE) ×2 IMPLANT
NEEDLE 22X1 1/2 (OR ONLY) (NEEDLE) IMPLANT
NEEDLE HYPO 25X1 1.5 SAFETY (NEEDLE) ×2 IMPLANT
NEEDLE SPNL 18GX3.5 QUINCKE PK (NEEDLE) ×4 IMPLANT
NS IRRIG 1000ML POUR BTL (IV SOLUTION) ×2 IMPLANT
PACK UNIVERSAL I (CUSTOM PROCEDURE TRAY) ×2 IMPLANT
PAD ARMBOARD 7.5X6 YLW CONV (MISCELLANEOUS) ×4 IMPLANT
POSITIONER HEAD PRONE TRACH (MISCELLANEOUS) ×2 IMPLANT
SPONGE GAUZE 2X2 8PLY STRL LF (GAUZE/BANDAGES/DRESSINGS) ×1 IMPLANT
SPONGE GAUZE 2X2 STER 10/PKG (GAUZE/BANDAGES/DRESSINGS) ×1
SUT MNCRL AB 4-0 PS2 18 (SUTURE) ×2 IMPLANT
SYR BULB IRRIG 60ML STRL (SYRINGE) ×2 IMPLANT
SYR CONTROL 10ML LL (SYRINGE) ×2 IMPLANT
TAPE CLOTH SURG 6X10 WHT LF (GAUZE/BANDAGES/DRESSINGS) ×1 IMPLANT
TOWEL GREEN STERILE (TOWEL DISPOSABLE) ×2 IMPLANT
TOWEL GREEN STERILE FF (TOWEL DISPOSABLE) ×2 IMPLANT
TRAY KYPHOPAK 15/3 ONESTEP 1ST (MISCELLANEOUS) ×1 IMPLANT
TRAY KYPHOPAK 20/3 ONESTEP 1ST (MISCELLANEOUS) IMPLANT

## 2022-03-28 NOTE — Op Note (Signed)
PATIENT NAME: Carrie [name]   MEDICAL RECORD NO.:   [id number]    DATE OF BIRTH: Apr 01, 1956   DATE OF PROCEDURE: 03/28/2022                              OPERATIVE REPORT   PREOPERATIVE DIAGNOSIS:  Osteoporotic L1 compression fracture (M80.88XA)  POSTOPERATIVE DIAGNOSIS:  Osteoporotic L1 compression fracture (M80.88XA)  PROCEDURE:  L1 kyphoplasty.  SURGEON:  Phylliss Bob, MD.  ASSISTANTPricilla Holm, PA-C  ANESTHESIA:  General endotracheal anesthesia.  COMPLICATIONS:  None.  DISPOSITION:  Stable.  ESTIMATED BLOOD LOSS:  Minimal.  INDICATIONS FOR SURGERY:  Briefly, Ms. Scripter is a very pleasant 66 y.o.- year-old female, who did have an onset of pain approximately 2 weeks ago.   Her pain was rather severe.  The patient's imaging studies did clearly reveal an L1 compression fracture.  We did discuss various treatment options available to her.  She did feel that her pain was very severe, and she was unable to bear it, and she did very much wish to proceed with a kyphoplasty procedure.  She did understand that her fracture would heal in time, but she did wish to proceed with surgery.  She did understand the risks of surgery, including infection, neurovascular injury, as well as cement extravasation, which could result in the need for additional surgical intervention.  Understanding this, she did wish to proceed with surgery.   OPERATIVE DETAILS:  On 03/28/2022, the patient was brought to surgery and general endotracheal anesthesia was administered.  The patient was placed prone on a well-padded flat Jackson bed with gel rolls placed under the patient's chest and hips.  Antibiotics were given.  AP and lateral fluoroscopy was brought into the field.  The L1 pedicles were marked out in the usual fashion.  After a time-out procedure was performed, I did advance Jamshidis across the L1 pedicles on the right and left sides.  I then drilled through the Jamshidis.  I then inserted  kyphoplasty balloons and I was able to inflate the balloons with approximately 4cc of contrast.  Partial restoration of the superior endplate was noted.  The balloons were deflated then removed.  At this point, after the cement was mixed, a total of approximately 6cc of cement was injected through the right and left cannulas, half on the right, and half on the left.  Excellent interdigitation of cement was identified.  No abnormal extravasation was noted.  The cement was then allowed to harden, after which point the Jamshidis were removed.  The wound was then irrigated and closed using 4-0 Monocryl.  Bacitracin and a sterile dressing were applied.  The patient was then awoken from general endotracheal anesthesia and transferred to recovery in stable condition.  Phylliss Bob, MD

## 2022-03-28 NOTE — Anesthesia Postprocedure Evaluation (Signed)
Anesthesia Post Note  Patient: Carrie [name]  Procedure(s) Performed: LUMBAR ONE KYPHOPLASTY     Patient location during evaluation: PACU Anesthesia Type: General Level of consciousness: sedated Pain management: pain level controlled Vital Signs Assessment: post-procedure vital signs reviewed and stable Respiratory status: spontaneous breathing and respiratory function stable Cardiovascular status: stable Postop Assessment: no apparent nausea or vomiting Anesthetic complications: no   No notable events documented.  Last Vitals:  Vitals:   03/28/22 1843 03/28/22 1858  BP: (!) 186/90 (!) 196/89  Pulse: 76 75  Resp: 12 17  Temp:    SpO2: 96% 95%    Last Pain:  Vitals:   03/28/22 1843  TempSrc:   PainSc: 0-No pain                 Leelah Hanna DANIEL

## 2022-03-28 NOTE — H&P (Signed)
PREOPERATIVE H&P  Chief Complaint: Low back pain  HPI: Carrie [name] is a 66 y.o. female who presents with ongoing pain in the low abck s/p a fall 2 weeks ago. Her pain continues to be severe.   MRI reveals an osteoporotic compression fracture   Patient has failed multiple forms of conservative care and continues to have pain (see office notes for additional details regarding the patient's full course of treatment)  Past Medical History:  Diagnosis Date   Anxiety    Arthritis    psoriatic arithritis, DDD    Asthma    Depression    Diabetes mellitus    type 2   Gastritis    Gastroparesis    GERD (gastroesophageal reflux disease)    Hernia    Hyperlipemia    Hyperlipemia    Hypertension    Liver cirrhosis ([place])    Neuropathy    Bil feet re: to Diabetes   Obesity    Osteoporosis    Plantar fasciitis    Stricture and stenosis of esophagus    Stroke ([place])    TIA (transient ischemic attack)    8-10 yrs ago, no problems since   Tuberculosis    tested positive 2011, no symptoms, was on medicine for 6 months   Past Surgical History:  Procedure Laterality Date   BREAST SURGERY Bilateral    biopsies both breast   CARDIAC CATHETERIZATION  08/30/2010   CESAREAN SECTION     x 2    CHOLECYSTECTOMY     COLONOSCOPY     FOOT SURGERY Left    spur removal   HERNIA REPAIR     IR FLUORO GUIDE CV LINE RIGHT  04/12/2021   IR US GUIDE VASC ACCESS RIGHT  04/12/2021   JOINT REPLACEMENT     bilat knee    KNEE SURGERY Bilateral    x 2 joint knee replacements   TOTAL KNEE REVISION Left 10/03/2021   Procedure: LEFT TOTAL KNEE REVISION POLY SWAP WITH IRRIGATION AND DEBRIDEMENT;  Surgeon: Melrose Nakayama, MD;  Location: [facility];  Service: Orthopedics;  Laterality: Left;   UPPER GASTROINTESTINAL ENDOSCOPY     WISDOM TOOTH EXTRACTION     Social History   Socioeconomic History   Marital status: Single    Spouse name: Not on file   Number of children: 2   Years of education:  Not on file   Highest education level: Not on file  Occupational History   Occupation: Disabled   Tobacco Use   Smoking status: Never   Smokeless tobacco: Never  Vaping Use   Vaping Use: Never used  Substance and Sexual Activity   Alcohol use: Not Currently    Comment: socially   Drug use: No   Sexual activity: Not Currently    Birth control/protection: Post-menopausal  Other Topics Concern   Not on file  Social History Narrative   [place] daily    Social Determinants of Health   Financial Resource Strain: Not on file  Food Insecurity: Not on file  Transportation Needs: Not on file  Physical Activity: Not on file  Stress: Not on file  Social Connections: Not on file   Family History  Problem Relation Age of Onset   Diabetes Other        maternal aunts and uncles   Diabetes Father    Liver disease Father    Emphysema Father        smoked   Diabetes Sister  Diabetes Brother    Sarcoidosis Brother    Emphysema Mother        smoked   Asthma Mother    Asthma Brother    Colon cancer Neg Hx    Esophageal cancer Neg Hx    Stomach cancer Neg Hx    Rectal cancer Neg Hx    Allergies  Allergen Reactions   Lisinopril Cough   Prior to Admission medications   Medication Sig Start Date End Date Taking? Authorizing Provider  albuterol (VENTOLIN HFA) 108 (90 Base) MCG/ACT inhaler Inhale 2 puffs into the lungs every 4 (four) hours as needed for wheezing or shortness of breath.   Yes [provider]  amLODipine (NORVASC) 5 MG tablet Take 1 tablet (5 mg total) by mouth daily. 09/30/15  Yes Dhungel, Nishant, MD  amoxicillin (AMOXIL) 500 MG capsule Take 1 capsule (500 mg total) by mouth 3 (three) times daily. 01/30/22  Yes Rosiland Oz, MD  aspirin EC 81 MG tablet Take 1 tablet (81 mg total) by mouth 2 (two) times daily after a meal. Patient taking differently: Take 81 mg by mouth daily. 10/06/21  Yes Loni Dolly, PA-C  doxycycline (VIBRA-TABS) 100 MG tablet Take 1  tablet (100 mg total) by mouth 2 (two) times daily. 11/07/21  Yes Rosiland Oz, MD  esomeprazole (NEXIUM) 40 MG capsule Take 40 mg by mouth every morning. 01/18/20  Yes [provider]  FLOVENT HFA 110 MCG/ACT inhaler Inhale 2 puffs into the lungs in the morning and at bedtime. 03/17/21  Yes [provider]  FLUoxetine (PROZAC) 20 MG capsule Take 20 mg by mouth daily. 01/13/22  Yes [provider]  memantine (NAMENDA) 10 MG tablet Take 10 mg by mouth 2 (two) times daily.   Yes [provider]  metFORMIN (GLUCOPHAGE-XR) 500 MG 24 hr tablet Take 1 tablet (500 mg total) by mouth daily. 01/16/22  Yes Renato Shin, MD  olmesartan (BENICAR) 40 MG tablet Take 1 tablet (40 mg total) by mouth daily. Please hold this medication, repeat kidney function at your pcp 's office , pcp to decide on when to resume olmesartan. 04/19/21  Yes Florencia Reasons, MD  pravastatin (PRAVACHOL) 40 MG tablet Take 40 mg by mouth daily.   Yes [provider]  primidone (MYSOLINE) 250 MG tablet Take 250 mg by mouth at bedtime.   Yes [provider]  promethazine (PHENERGAN) 12.5 MG tablet Take 12.5 mg by mouth every 6 (six) hours as needed for nausea or vomiting.   Yes [provider]  Semaglutide, 1 MG/DOSE, (OZEMPIC, 1 MG/DOSE,) 4 MG/3ML SOPN Inject 1 mg as directed once a week. Patient taking differently: Inject 1 mg as directed once a week. Takes on Wednesday 01/16/22  Yes Renato Shin, MD  traMADol (ULTRAM) 50 MG tablet Take 50 mg by mouth 2 (two) times daily as needed for moderate pain. 12/06/21  Yes [provider]  Accu-Chek Softclix Lancets lancets Use as instructed to check blood sugar 2 times daily Dx E11.9 03/23/21   Renato Shin, MD  Alcohol Swabs (B-D SINGLE USE SWABS REGULAR) PADS 1 each by Does not apply route 2 (two) times daily. Cleanse site BID for glucose testing; E11.9 04/08/20   Renato Shin, MD  Blood Glucose Monitoring Suppl (ACCU-CHEK GUIDE ME) w/Device  KIT Use as instructed to check blood sugar 2 times daily Dx E11.9 03/23/21   Renato Shin, MD  busPIRone (BUSPAR) 5 MG tablet Take 5 mg by mouth 2 (two) times daily.  [provider]  COSENTYX SENSOREADY, 300 MG, 150 MG/ML SOAJ Inject 300 mg into the skin every 30 (thirty) days. 02/27/22   [provider]  glucose blood (ACCU-CHEK GUIDE) test strip USE AS INSTRUCTED TO CHECK BLOOD SUGAR 2 TIMES DAILY 12/25/21   Renato Shin, MD  ondansetron (ZOFRAN) 4 MG tablet Take 1 tablet (4 mg total) by mouth every 8 (eight) hours as needed for nausea or vomiting. Patient not taking: Reported on 03/11/2022 02/01/22   Rosiland Oz, MD     All other systems have been reviewed and were otherwise negative with the exception of those mentioned in the HPI and as above.  Physical Exam: Vitals:   03/28/22 1324  BP: (!) 173/86  Pulse: 91  Resp: 17  Temp: 98.7 F (37.1 C)  SpO2: 100%    Body mass index is 23.24 kg/m.  General: Alert, no acute distress Cardiovascular: No pedal edema Respiratory: No cyanosis, no use of accessory musculature Skin: No lesions in the area of chief complaint Neurologic: Sensation intact distally Psychiatric: Patient is competent for consent with normal mood and affect Lymphatic: No axillary or cervical lymphadenopathy   Assessment/Plan: L1 compression fracture, osteoporotic Plan for Procedure(s): LUMBAR 1 KYPHOPLASTY   Norva Karvonen, MD 03/28/2022 4:05 PM

## 2022-03-28 NOTE — Transfer of Care (Signed)
Immediate Anesthesia Transfer of Care Note  Patient: Carrie [name]  Procedure(s) Performed: LUMBAR ONE KYPHOPLASTY  Patient Location: PACU  Anesthesia Type:General  Level of Consciousness: awake, alert  and oriented  Airway & Oxygen Therapy: Patient Spontanous Breathing  Post-op Assessment: Report given to RN and Post -op Vital signs reviewed and stable  Post vital signs: Reviewed and stable  Last Vitals:  Vitals Value Taken Time  BP 158/91 03/28/22 1815  Temp 36.1 C 03/28/22 1813  Pulse 108 03/28/22 1816  Resp 16 03/28/22 1816  SpO2 94 % 03/28/22 1816  Vitals shown include unvalidated device data.  Last Pain:  Vitals:   03/28/22 1338  TempSrc:   PainSc: 0-No pain         Complications: No notable events documented.

## 2022-03-28 NOTE — Anesthesia Procedure Notes (Signed)
Procedure Name: Intubation Date/Time: 03/28/2022 5:19 PM  Performed by: Eligha Bridegroom, CRNAPre-anesthesia Checklist: Emergency Drugs available, Patient identified, Suction available, Patient being monitored and Timeout performed Patient Re-evaluated:Patient Re-evaluated prior to induction Oxygen Delivery Method: Circle system utilized Preoxygenation: Pre-oxygenation with 100% oxygen Induction Type: IV induction Ventilation: Mask ventilation without difficulty Laryngoscope Size: Mac Grade View: Grade I Tube type: Oral Tube size: 7.0 mm Number of attempts: 1 Airway Equipment and Method: Stylet Secured at: 19 cm Tube secured with: Tape Dental Injury: Teeth and Oropharynx as per pre-operative assessment

## 2022-03-29 ENCOUNTER — Encounter (HOSPITAL_COMMUNITY): Payer: Self-pay | Admitting: Orthopedic Surgery

## 2022-04-03 ENCOUNTER — Ambulatory Visit: Payer: Medicare Other | Admitting: Infectious Diseases

## 2022-04-06 ENCOUNTER — Ambulatory Visit (INDEPENDENT_AMBULATORY_CARE_PROVIDER_SITE_OTHER): Payer: Medicare Other | Admitting: Infectious Diseases

## 2022-04-06 ENCOUNTER — Other Ambulatory Visit: Payer: Self-pay

## 2022-04-06 VITALS — BP 155/82 | HR 110 | Temp 97.6°F | Resp 16 | Ht 60.0 in | Wt 122.0 lb

## 2022-04-06 DIAGNOSIS — L4050 Arthropathic psoriasis, unspecified: Secondary | ICD-10-CM

## 2022-04-06 DIAGNOSIS — Z5181 Encounter for therapeutic drug level monitoring: Secondary | ICD-10-CM

## 2022-04-06 DIAGNOSIS — T8459XA Infection and inflammatory reaction due to other internal joint prosthesis, initial encounter: Secondary | ICD-10-CM | POA: Insufficient documentation

## 2022-04-06 DIAGNOSIS — K7460 Unspecified cirrhosis of liver: Secondary | ICD-10-CM

## 2022-04-06 DIAGNOSIS — Z96659 Presence of unspecified artificial knee joint: Secondary | ICD-10-CM

## 2022-04-06 DIAGNOSIS — T8454XD 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, subsequent encounter: Secondary | ICD-10-CM | POA: Diagnosis not present

## 2022-04-06 DIAGNOSIS — E1121 Type 2 diabetes mellitus with diabetic nephropathy: Secondary | ICD-10-CM | POA: Insufficient documentation

## 2022-04-06 DIAGNOSIS — N1831 Chronic kidney disease, stage 3a: Secondary | ICD-10-CM

## 2022-04-06 HISTORY — DX: Presence of unspecified artificial knee joint: T84.59XA

## 2022-04-06 MED ORDER — DOXYCYCLINE HYCLATE 100 MG PO TABS: 100.0000 mg | ORAL_TABLET | Freq: Two times a day (BID) | ORAL | 1 refills | Status: DC

## 2022-04-07 LAB — CBC
HCT: 33.8 % — ABNORMAL LOW (ref 35.0–45.0)
Hemoglobin: 10.7 g/dL — ABNORMAL LOW (ref 11.7–15.5)
MCH: 24.5 pg — ABNORMAL LOW (ref 27.0–33.0)
MCHC: 31.7 g/dL — ABNORMAL LOW (ref 32.0–36.0)
MCV: 77.3 fL — ABNORMAL LOW (ref 80.0–100.0)
MPV: 10.6 fL (ref 7.5–12.5)
Platelets: 393 10*3/uL (ref 140–400)
RBC: 4.37 10*6/uL (ref 3.80–5.10)
RDW: 17 % — ABNORMAL HIGH (ref 11.0–15.0)
WBC: 6.1 10*3/uL (ref 3.8–10.8)

## 2022-04-07 LAB — BASIC METABOLIC PANEL
BUN/Creatinine Ratio: 15 (calc) (ref 6–22)
BUN: 18 mg/dL (ref 7–25)
CO2: 23 mmol/L (ref 20–32)
Calcium: 9 mg/dL (ref 8.6–10.4)
Chloride: 106 mmol/L (ref 98–110)
Creat: 1.21 mg/dL — ABNORMAL HIGH (ref 0.50–1.05)
Glucose, Bld: 121 mg/dL — ABNORMAL HIGH (ref 65–99)
Potassium: 3.5 mmol/L (ref 3.5–5.3)
Sodium: 140 mmol/L (ref 135–146)

## 2022-04-07 LAB — C-REACTIVE PROTEIN: CRP: 12.8 mg/L — ABNORMAL HIGH (ref <8.0)

## 2022-04-07 LAB — SEDIMENTATION RATE: Sed Rate: 68 mm/h — ABNORMAL HIGH (ref 0–30)

## 2022-04-09 ENCOUNTER — Telehealth: Payer: Self-pay

## 2022-04-09 DIAGNOSIS — T8459XD Infection and inflammatory reaction due to other internal joint prosthesis, subsequent encounter: Secondary | ICD-10-CM

## 2022-04-13 ENCOUNTER — Observation Stay (HOSPITAL_COMMUNITY)
Admission: EM | Admit: 2022-04-13 | Discharge: 2022-04-15 | Disposition: A | Discharge status: Home / Self Care | Payer: Medicare Other | Attending: Internal Medicine | Admitting: Internal Medicine

## 2022-04-13 ENCOUNTER — Other Ambulatory Visit: Payer: Self-pay

## 2022-04-13 ENCOUNTER — Encounter (HOSPITAL_COMMUNITY): Payer: Self-pay

## 2022-04-13 DIAGNOSIS — E119 Type 2 diabetes mellitus without complications: Secondary | ICD-10-CM | POA: Diagnosis not present

## 2022-04-13 DIAGNOSIS — K6289 Other specified diseases of anus and rectum: Secondary | ICD-10-CM | POA: Diagnosis not present

## 2022-04-13 DIAGNOSIS — E785 Hyperlipidemia, unspecified: Secondary | ICD-10-CM | POA: Diagnosis present

## 2022-04-13 DIAGNOSIS — G3184 Mild cognitive impairment, so stated: Secondary | ICD-10-CM | POA: Diagnosis present

## 2022-04-13 DIAGNOSIS — K219 Gastro-esophageal reflux disease without esophagitis: Secondary | ICD-10-CM | POA: Diagnosis present

## 2022-04-13 DIAGNOSIS — Z7984 Long term (current) use of oral hypoglycemic drugs: Secondary | ICD-10-CM | POA: Diagnosis not present

## 2022-04-13 DIAGNOSIS — D509 Iron deficiency anemia, unspecified: Secondary | ICD-10-CM | POA: Diagnosis not present

## 2022-04-13 DIAGNOSIS — I129 Hypertensive chronic kidney disease with stage 1 through stage 4 chronic kidney disease, or unspecified chronic kidney disease: Secondary | ICD-10-CM | POA: Diagnosis not present

## 2022-04-13 DIAGNOSIS — E1122 Type 2 diabetes mellitus with diabetic chronic kidney disease: Secondary | ICD-10-CM | POA: Insufficient documentation

## 2022-04-13 DIAGNOSIS — J45909 Unspecified asthma, uncomplicated: Secondary | ICD-10-CM | POA: Insufficient documentation

## 2022-04-13 DIAGNOSIS — E118 Type 2 diabetes mellitus with unspecified complications: Secondary | ICD-10-CM | POA: Diagnosis present

## 2022-04-13 DIAGNOSIS — K222 Esophageal obstruction: Secondary | ICD-10-CM | POA: Diagnosis not present

## 2022-04-13 DIAGNOSIS — F32A Depression, unspecified: Secondary | ICD-10-CM | POA: Diagnosis present

## 2022-04-13 DIAGNOSIS — Z8673 Personal history of transient ischemic attack (TIA), and cerebral infarction without residual deficits: Secondary | ICD-10-CM | POA: Diagnosis not present

## 2022-04-13 DIAGNOSIS — K5641 Fecal impaction: Principal | ICD-10-CM | POA: Diagnosis present

## 2022-04-13 DIAGNOSIS — K7460 Unspecified cirrhosis of liver: Secondary | ICD-10-CM | POA: Diagnosis not present

## 2022-04-13 DIAGNOSIS — N1831 Chronic kidney disease, stage 3a: Secondary | ICD-10-CM | POA: Diagnosis not present

## 2022-04-13 DIAGNOSIS — I10 Essential (primary) hypertension: Secondary | ICD-10-CM | POA: Diagnosis present

## 2022-04-13 DIAGNOSIS — F419 Anxiety disorder, unspecified: Secondary | ICD-10-CM | POA: Diagnosis present

## 2022-04-13 DIAGNOSIS — Z96652 Presence of left artificial knee joint: Secondary | ICD-10-CM | POA: Diagnosis not present

## 2022-04-13 DIAGNOSIS — K5900 Constipation, unspecified: Secondary | ICD-10-CM | POA: Diagnosis present

## 2022-04-13 LAB — CBC
HCT: 37.7 % (ref 36.0–46.0)
Hemoglobin: 11.7 g/dL — ABNORMAL LOW (ref 12.0–15.0)
MCH: 24 pg — ABNORMAL LOW (ref 26.0–34.0)
MCHC: 31 g/dL (ref 30.0–36.0)
MCV: 77.3 fL — ABNORMAL LOW (ref 80.0–100.0)
Platelets: 398 10*3/uL (ref 150–400)
RBC: 4.88 MIL/uL (ref 3.87–5.11)
RDW: 16.1 % — ABNORMAL HIGH (ref 11.5–15.5)
WBC: 8.9 10*3/uL (ref 4.0–10.5)
nRBC: 0 % (ref 0.0–0.2)

## 2022-04-13 LAB — LIPASE, BLOOD: Lipase: 29 U/L (ref 11–51)

## 2022-04-13 LAB — COMPREHENSIVE METABOLIC PANEL
ALT: 11 U/L (ref 0–44)
AST: 20 U/L (ref 15–41)
Albumin: 3.9 g/dL (ref 3.5–5.0)
Alkaline Phosphatase: 147 U/L — ABNORMAL HIGH (ref 38–126)
Anion gap: 15 (ref 5–15)
BUN: 15 mg/dL (ref 8–23)
CO2: 22 mmol/L (ref 22–32)
Calcium: 9.6 mg/dL (ref 8.9–10.3)
Chloride: 102 mmol/L (ref 98–111)
Creatinine, Ser: 1.07 mg/dL — ABNORMAL HIGH (ref 0.44–1.00)
GFR, Estimated: 58 mL/min — ABNORMAL LOW (ref >60)
Glucose, Bld: 186 mg/dL — ABNORMAL HIGH (ref 70–99)
Potassium: 4 mmol/L (ref 3.5–5.1)
Sodium: 139 mmol/L (ref 135–145)
Total Bilirubin: 0.9 mg/dL (ref 0.3–1.2)
Total Protein: 8.7 g/dL — ABNORMAL HIGH (ref 6.5–8.1)

## 2022-04-13 NOTE — ED Triage Notes (Signed)
Patient has been constipated since yesterday. And began vomiting last night. She said she cannot keep anything down. Has recently been on hydrocodone, but has not taken one for a week.

## 2022-04-13 NOTE — ED Provider Triage Note (Signed)
Emergency Medicine Provider Triage Evaluation Note  Carrie [name] , a 66 y.o. female  was evaluated in triage.  Pt complains of nausea, vomiting, constipation.  Constipation started yesterday.  She tried taking something for this however her symptoms got worse.  She began having vomiting and significant generalized abdominal pain today.  She is not moving her bowels or passing gas.  She has a history of a cholecystectomy and hernia repairs.  No previous bowel obstruction.  Review of Systems  Positive: Abdominal pain, vomiting Negative: Fever  Physical Exam  BP (!) 192/117 (BP Location: Left Arm)   Pulse (!) 103   Temp 97.7 F (36.5 C) (Oral)   Resp 18   Ht 5' (1.524 m)   Wt 55.3 kg   LMP  (LMP Unknown)   SpO2 99%   BMI 23.83 kg/m  Gen:   Awake, appears uncomfortable Resp:  Normal effort  MSK:   Moves extremities without difficulty  Other:  Generalized abdominal pain  Medical Decision Making  Medically screening exam initiated at 10:38 PM.  Appropriate orders placed.  Carrie [name] was informed that the remainder of the evaluation will be completed by another provider, this initial triage assessment does not replace that evaluation, and the importance of remaining in the ED until their evaluation is complete.     Carlisle Cater, PA-C 04/13/22 2239

## 2022-04-13 NOTE — ED Notes (Signed)
Asked for a urine, pt cant provide

## 2022-04-14 ENCOUNTER — Emergency Department (HOSPITAL_COMMUNITY): Payer: Medicare Other

## 2022-04-14 DIAGNOSIS — N1831 Chronic kidney disease, stage 3a: Secondary | ICD-10-CM

## 2022-04-14 DIAGNOSIS — K5641 Fecal impaction: Secondary | ICD-10-CM | POA: Diagnosis not present

## 2022-04-14 DIAGNOSIS — K6289 Other specified diseases of anus and rectum: Secondary | ICD-10-CM

## 2022-04-14 LAB — URINALYSIS, ROUTINE W REFLEX MICROSCOPIC
Bilirubin Urine: NEGATIVE
Glucose, UA: 50 mg/dL — AB
Ketones, ur: 5 mg/dL — AB
Nitrite: NEGATIVE
Protein, ur: 30 mg/dL — AB
Specific Gravity, Urine: 1.024 (ref 1.005–1.030)
pH: 8 (ref 5.0–8.0)

## 2022-04-14 LAB — CBG MONITORING, ED: Glucose-Capillary: 163 mg/dL — ABNORMAL HIGH (ref 70–99)

## 2022-04-14 LAB — GLUCOSE, CAPILLARY
Glucose-Capillary: 118 mg/dL — ABNORMAL HIGH (ref 70–99)
Glucose-Capillary: 114 mg/dL — ABNORMAL HIGH (ref 70–99)

## 2022-04-14 MED ORDER — ACETAMINOPHEN 650 MG RE SUPP: 650.0000 mg | Freq: Four times a day (QID) | RECTAL | Status: DC | PRN

## 2022-04-14 MED ORDER — INSULIN ASPART 100 UNIT/ML IJ SOLN
0.0000 [IU] | Freq: Three times a day (TID) | INTRAMUSCULAR | Status: DC
  Administered 2022-04-14 – 2022-04-15 (×2): 1 [IU] via SUBCUTANEOUS
  Filled 2022-04-14: qty 0.06

## 2022-04-14 MED ORDER — PIPERACILLIN-TAZOBACTAM 3.375 G IVPB 30 MIN
3.3750 g | Freq: Once | INTRAVENOUS | Status: AC
  Administered 2022-04-14: 3.375 g via INTRAVENOUS
  Filled 2022-04-14: qty 50

## 2022-04-14 MED ORDER — BUSPIRONE HCL 5 MG PO TABS
5.0000 mg | ORAL_TABLET | Freq: Two times a day (BID) | ORAL | Status: DC
  Administered 2022-04-14 – 2022-04-15 (×3): 5 mg via ORAL
  Filled 2022-04-14 (×3): qty 1

## 2022-04-14 MED ORDER — POLYETHYLENE GLYCOL 3350 17 G PO PACK
17.0000 g | PACK | Freq: Two times a day (BID) | ORAL | Status: DC
  Administered 2022-04-14 – 2022-04-15 (×3): 17 g via ORAL
  Filled 2022-04-14 (×3): qty 1

## 2022-04-14 MED ORDER — FLUTICASONE PROPIONATE HFA 110 MCG/ACT IN AERO: 2.0000 | INHALATION_SPRAY | Freq: Two times a day (BID) | RESPIRATORY_TRACT | Status: DC

## 2022-04-14 MED ORDER — MORPHINE SULFATE (PF) 4 MG/ML IV SOLN
6.0000 mg | Freq: Once | INTRAVENOUS | Status: AC
  Administered 2022-04-14: 6 mg via INTRAVENOUS
  Filled 2022-04-14: qty 2

## 2022-04-14 MED ORDER — DOXYCYCLINE HYCLATE 100 MG PO TABS
100.0000 mg | ORAL_TABLET | Freq: Two times a day (BID) | ORAL | Status: DC
  Administered 2022-04-14 – 2022-04-15 (×3): 100 mg via ORAL
  Filled 2022-04-14 (×3): qty 1

## 2022-04-14 MED ORDER — ALBUTEROL SULFATE HFA 108 (90 BASE) MCG/ACT IN AERS
2.0000 | INHALATION_SPRAY | RESPIRATORY_TRACT | Status: DC | PRN
Start: 2022-04-14 — End: 2022-04-14

## 2022-04-14 MED ORDER — SODIUM CHLORIDE 0.9 % IV BOLUS
1000.0000 mL | Freq: Once | INTRAVENOUS | Status: AC
  Administered 2022-04-14: 1000 mL via INTRAVENOUS

## 2022-04-14 MED ORDER — ENOXAPARIN SODIUM 40 MG/0.4ML IJ SOSY
40.0000 mg | PREFILLED_SYRINGE | INTRAMUSCULAR | Status: DC
  Administered 2022-04-14 – 2022-04-15 (×2): 40 mg via SUBCUTANEOUS
  Filled 2022-04-14 (×2): qty 0.4

## 2022-04-14 MED ORDER — PANTOPRAZOLE SODIUM 40 MG PO TBEC
40.0000 mg | DELAYED_RELEASE_TABLET | Freq: Two times a day (BID) | ORAL | Status: DC
  Administered 2022-04-14 – 2022-04-15 (×3): 40 mg via ORAL
  Filled 2022-04-14 (×3): qty 1

## 2022-04-14 MED ORDER — BUDESONIDE 0.25 MG/2ML IN SUSP
0.2500 mg | Freq: Two times a day (BID) | RESPIRATORY_TRACT | Status: DC
  Administered 2022-04-14 – 2022-04-15 (×2): 0.25 mg via RESPIRATORY_TRACT
  Filled 2022-04-14 (×2): qty 2

## 2022-04-14 MED ORDER — ACETAMINOPHEN 325 MG PO TABS: 650.0000 mg | ORAL_TABLET | Freq: Four times a day (QID) | ORAL | Status: DC | PRN

## 2022-04-14 MED ORDER — ONDANSETRON HCL 4 MG PO TABS: 4.0000 mg | ORAL_TABLET | Freq: Four times a day (QID) | ORAL | Status: DC | PRN

## 2022-04-14 MED ORDER — AMOXICILLIN 500 MG PO CAPS
500.0000 mg | ORAL_CAPSULE | Freq: Three times a day (TID) | ORAL | Status: DC
  Administered 2022-04-14 – 2022-04-15 (×4): 500 mg via ORAL
  Filled 2022-04-14 (×5): qty 1

## 2022-04-14 MED ORDER — ONDANSETRON HCL 4 MG/2ML IJ SOLN: 4.0000 mg | Freq: Four times a day (QID) | INTRAMUSCULAR | Status: DC | PRN

## 2022-04-14 MED ORDER — PRAVASTATIN SODIUM 40 MG PO TABS
40.0000 mg | ORAL_TABLET | Freq: Every day | ORAL | Status: DC
  Administered 2022-04-14 – 2022-04-15 (×2): 40 mg via ORAL
  Filled 2022-04-14: qty 1
  Filled 2022-04-14: qty 2

## 2022-04-14 MED ORDER — FLUOXETINE HCL 20 MG PO CAPS
20.0000 mg | ORAL_CAPSULE | Freq: Every day | ORAL | Status: DC
  Administered 2022-04-14 – 2022-04-15 (×2): 20 mg via ORAL
  Filled 2022-04-14 (×2): qty 1

## 2022-04-14 MED ORDER — ENSURE ENLIVE PO LIQD
237.0000 mL | Freq: Two times a day (BID) | ORAL | Status: DC
  Administered 2022-04-15: 237 mL via ORAL

## 2022-04-14 MED ORDER — IOHEXOL 300 MG/ML  SOLN
100.0000 mL | Freq: Once | INTRAMUSCULAR | Status: AC | PRN
  Administered 2022-04-14: 100 mL via INTRAVENOUS

## 2022-04-14 MED ORDER — METHOCARBAMOL 500 MG PO TABS: 750.0000 mg | ORAL_TABLET | Freq: Four times a day (QID) | ORAL | Status: DC | PRN

## 2022-04-14 MED ORDER — FLEET ENEMA 7-19 GM/118ML RE ENEM
1.0000 | ENEMA | Freq: Every day | RECTAL | Status: DC
Start: 2022-04-14 — End: 2022-04-15
  Administered 2022-04-14: 1 via RECTAL
  Filled 2022-04-14: qty 1

## 2022-04-14 MED ORDER — ONDANSETRON HCL 4 MG/2ML IJ SOLN
4.0000 mg | Freq: Once | INTRAMUSCULAR | Status: AC
  Administered 2022-04-14: 4 mg via INTRAVENOUS
  Filled 2022-04-14: qty 2

## 2022-04-14 MED ORDER — TOPIRAMATE 100 MG PO TABS
100.0000 mg | ORAL_TABLET | Freq: Every day | ORAL | Status: DC
  Administered 2022-04-14 – 2022-04-15 (×2): 100 mg via ORAL
  Filled 2022-04-14 (×2): qty 1

## 2022-04-14 MED ORDER — PROCHLORPERAZINE EDISYLATE 10 MG/2ML IJ SOLN
10.0000 mg | Freq: Once | INTRAMUSCULAR | Status: AC
  Administered 2022-04-14: 10 mg via INTRAVENOUS
  Filled 2022-04-14: qty 2

## 2022-04-14 MED ORDER — PROMETHAZINE HCL 25 MG PO TABS
12.5000 mg | ORAL_TABLET | Freq: Four times a day (QID) | ORAL | Status: DC | PRN
Start: 2022-04-14 — End: 2022-04-15

## 2022-04-14 MED ORDER — ORAL CARE MOUTH RINSE: 15.0000 mL | OROMUCOSAL | Status: DC | PRN

## 2022-04-14 MED ORDER — ALBUTEROL SULFATE (2.5 MG/3ML) 0.083% IN NEBU: 2.5000 mg | INHALATION_SOLUTION | RESPIRATORY_TRACT | Status: DC | PRN

## 2022-04-14 NOTE — H&P (Signed)
History and Physical    Carrie [name] SLP:[id number] DOB: [birth year] DOA: 04/13/2022  PCP: Jenel Lucks, PA-C   Patient coming from:   Chief Complaint  Patient presents with   Constipation      HPI: 66 y.o. year-old female with a history of diabetes on metformin, anxiety/depression on BuSpar Prozac, HLD, GERD who kyphoplasty of weeks ago and has had issue with fecal impaction in the past and currently taking pain medication for the first week after surgery presented to the ED with abdominal pain. She was having constipation x2 days.  In the ED CT scan showed moderate stool distending the rectum with wall thickening and surrounding inflammatory changes consistent with stercoral proctitis, reactive fluid in the posterior deep pelvis and rectal wall pneumatosis could be due to proctitis or due to bowel ischemia, no perforation seen, also showed gastroenteritis, constipation, interval decrease size of the spleen, L1 compression fracture with documentation no interval change since kyphoplasty 6/14, aortic atherosclerosis.  She was hemodynamically stable afebrile initially with tachycardia.  Labs showed creatinine 1.2 CRP 2 while normal WBC and anemia with hemoglobin 10.7 g.  ER performed disimpaction, surgery was consulted and requested admission for observation Dr. Teressa Senter on imaging.  UA with leukocytes large, nitrate negative WBC 11-20. Rbc 11-20.  On my exam she otherwise denies any nausea, vomiting, chest pain, shortness of breath, fever,headache, focal weakness, numbness tingling, speech difficulties dysuria or frequency. She said she had " quite a bit of stool out" with disimpaction last night and now pain is resolved.   Assessment/Plan Principal Problem:   Fecal impaction (HCC) Active Problems:   Microcytic anemia   Benign essential HTN   Type 2 diabetes mellitus with complication, without long-term current use of insulin (HCC)   Cirrhosis of liver not due to alcohol  (HCC)   Anxiety and depression   GERD with esophageal stricture and stenosis s/p dilation    Mild neurocognitive disorder   Hyperlipidemia   Proctitis   Stage 3a chronic kidney disease (CKD) (HCC)  Fecal impaction Proctitis-stercoral: Disimpacted in the ED, surgery is on consult advised aggressive bowel regimen and monitor due to proctitis and other CT findings.  Constipation likely this is due to patient's recent opiate use for pain medication following kyphoplasty 6/14 .discussed with general surgery advsied no need for antibiotics-monitor fever curve, WBC count.  Appreciate surgical input.  Microcytic anemia: Chronic in the setting of CKD.  Hemoglobin stable at 10-11 g Benign essential HTN: BP stable not on meds Type 2 diabetes mellitus with complication, without long-term current use of insulin: We will hold metformin, add sliding scale insulin while inpatient.  Cirrhosis of liver not due to alcohol-compensated, LFTs are stable with normal bilirubin, follow-up outpatient Anxiety and depression: Continue BuSpar and Prozac/Topamax GERD with esophageal stricture and stenosis s/p dilation history: Continue home PPI Mild neurocognitive disorder: At baseline, continue Namenda Left knee prosthesis with infection and inflammatory reaction on amoxicillin and doxycycline followed by ID Psoriatic arthritis continue Cosentyx on maintenance x3 months Hyperlipidemia: Continue pravastatin Stage 3a chronic kidney disease: Creatinine stable/improving, add gentle hydration in the setting of #1  Body mass index is 23.83 kg/m.   Severity of Illness: The appropriate patient status for this patient is OBSERVATION. Observation status is judged to be reasonable and necessary in order to provide the required intensity of service to ensure the patient's safety. The patient's presenting symptoms, physical exam findings, and initial radiographic and laboratory data in the context of their medical condition is  felt to place them at decreased risk for further clinical deterioration. Furthermore, it is anticipated that the patient will be medically stable for discharge from the hospital within 2 midnights of admission.    DVT prophylaxis: enoxaparin (LOVENOX) injection 40 mg Start: 04/14/22 0900 Code Status:   Code Status: Full Code  Family Communication: Admission, patients condition and plan of care including tests being ordered have been discussed with the patient who indicate understanding and agree with the plan and Code Status.  Consults called:  General surgery  Review of Systems: All systems were reviewed and were negative except as mentioned in HPI above. Negative for fever Negative for chest pain Negative for shortness of breath  Past Medical History:  Diagnosis Date   Anxiety    Arthritis    psoriatic arithritis, DDD    Asthma    Depression    Diabetes mellitus    type 2   Gastritis    Gastroparesis    GERD (gastroesophageal reflux disease)    Hernia    Hyperlipemia    Hyperlipemia    Hypertension    Liver cirrhosis ([place])    Neuropathy    Bil feet re: to Diabetes   Obesity    Osteoporosis    Plantar fasciitis    Stricture and stenosis of esophagus    Stroke ([place])    TIA (transient ischemic attack)    8-10 yrs ago, no problems since   Tuberculosis    tested positive 2011, no symptoms, was on medicine for 6 months    Past Surgical History:  Procedure Laterality Date   BREAST SURGERY Bilateral    biopsies both breast   CARDIAC CATHETERIZATION  08/30/2010   CESAREAN SECTION     x 2    CHOLECYSTECTOMY     COLONOSCOPY     FOOT SURGERY Left    spur removal   HERNIA REPAIR     IR FLUORO GUIDE CV LINE RIGHT  04/12/2021   IR US GUIDE VASC ACCESS RIGHT  04/12/2021   JOINT REPLACEMENT     bilat knee    KNEE SURGERY Bilateral    x 2 joint knee replacements   KYPHOPLASTY N/A 03/28/2022   Procedure: LUMBAR ONE KYPHOPLASTY;  Surgeon: Phylliss Bob, MD;  Location: [facility];  Service: Orthopedics;  Laterality: N/A;  LUMBAR ONE KYPHOPLASTY   TOTAL KNEE REVISION Left 10/03/2021   Procedure: LEFT TOTAL KNEE REVISION POLY SWAP WITH IRRIGATION AND DEBRIDEMENT;  Surgeon: Melrose Nakayama, MD;  Location: [facility];  Service: Orthopedics;  Laterality: Left;   UPPER GASTROINTESTINAL ENDOSCOPY     WISDOM TOOTH EXTRACTION       reports that she has never smoked. She has never used smokeless tobacco. She reports that she does not currently use alcohol. She reports that she does not use drugs.  Allergies  Allergen Reactions   Lisinopril Cough    Family History  Problem Relation Age of Onset   Diabetes Other        maternal aunts and uncles   Diabetes Father    Liver disease Father    Emphysema Father        smoked   Diabetes Sister    Diabetes Brother    Sarcoidosis Brother    Emphysema Mother        smoked   Asthma Mother    Asthma Brother    Colon cancer Neg Hx    Esophageal cancer Neg Hx    Stomach cancer Neg Hx  Rectal cancer Neg Hx      Prior to Admission medications   Medication Sig Start Date End Date Taking? Authorizing Provider  albuterol (VENTOLIN HFA) 108 (90 Base) MCG/ACT inhaler Inhale 2 puffs into the lungs every 4 (four) hours as needed for wheezing or shortness of breath.   Yes [provider]  amoxicillin (AMOXIL) 500 MG capsule Take 1 capsule (500 mg total) by mouth 3 (three) times daily. 01/30/22  Yes Rosiland Oz, MD  busPIRone (BUSPAR) 5 MG tablet Take 5 mg by mouth 2 (two) times daily.   Yes [provider]  COSENTYX SENSOREADY, 300 MG, 150 MG/ML SOAJ Inject 300 mg into the skin every 30 (thirty) days. 02/27/22  Yes [provider]  doxycycline (VIBRA-TABS) 100 MG tablet Take 1 tablet (100 mg total) by mouth 2 (two) times daily. 04/06/22  Yes Rosiland Oz, MD  esomeprazole (NEXIUM) 40 MG capsule Take 40 mg by mouth 2 (two) times daily before a meal. 01/18/20  Yes [provider]  FLOVENT HFA  110 MCG/ACT inhaler Inhale 2 puffs into the lungs in the morning and at bedtime. 03/17/21  Yes [provider]  FLUoxetine (PROZAC) 20 MG capsule Take 20 mg by mouth daily. 01/13/22  Yes [provider]  HYDROcodone-acetaminophen (NORCO/VICODIN) 5-325 MG tablet Take 1 tablet by mouth every 6 (six) hours as needed for moderate pain or severe pain. 03/28/22 03/28/23 Yes McKenzie, Lennie Muckle, PA-C  metFORMIN (GLUCOPHAGE-XR) 500 MG 24 hr tablet Take 1 tablet (500 mg total) by mouth daily. 01/16/22  Yes Renato Shin, MD  methocarbamol (ROBAXIN) 750 MG tablet Take 1 tablet (750 mg total) by mouth every 6 (six) hours as needed for muscle spasms. 03/28/22  Yes McKenzie, Lonn Georgia J, PA-C  pravastatin (PRAVACHOL) 40 MG tablet Take 40 mg by mouth daily.   Yes [provider]  promethazine (PHENERGAN) 12.5 MG tablet Take 12.5 mg by mouth every 6 (six) hours as needed for nausea or vomiting.   Yes [provider]  topiramate (TOPAMAX) 100 MG tablet Take 100 mg by mouth daily. 12/14/21  Yes [provider]  Accu-Chek Softclix Lancets lancets Use as instructed to check blood sugar 2 times daily Dx E11.9 03/23/21   Renato Shin, MD  Blood Glucose Monitoring Suppl (ACCU-CHEK GUIDE ME) w/Device KIT Use as instructed to check blood sugar 2 times daily Dx E11.9 03/23/21   Renato Shin, MD  glucose blood (ACCU-CHEK GUIDE) test strip USE AS INSTRUCTED TO CHECK BLOOD SUGAR 2 TIMES DAILY 12/25/21   Renato Shin, MD    Physical Exam: Vitals:   04/14/22 0717 04/14/22 0730 04/14/22 0800 04/14/22 0830  BP: 129/78 136/72 126/71 129/79  Pulse: 81 84 88 84  Resp: 12 16 13 16   Temp:      TempSrc:      SpO2: 93% 91% 90% 91%  Weight:      Height:        General exam: AAOx3 , NAD, weak appearing. HEENT:Oral mucosa moist, Ear/Nose WNL grossly, dentition normal. Respiratory system: bilaterally clear ,no wheezing or crackles,no use of accessory muscle Cardiovascular system: S1 & S2 +, No  JVD,. Gastrointestinal system: Abdomen soft, mildly tender suprapubic area,ND, BS+ Nervous System:Alert, awake, moving extremities and grossly nonfocal Extremities: b/l knee scars, non tender, distal peripheral pulses palpable.  Skin: No rashes,no icterus. MSK: Normal muscle bulk,tone, power   Labs on Admission: I have personally reviewed following labs and imaging studies  CBC: Recent Labs  Lab 04/13/22 2225  WBC 8.9  HGB 11.7*  HCT 37.7  MCV 77.3*  PLT 161   Basic Metabolic Panel: Recent Labs  Lab 04/13/22 2225  NA 139  K 4.0  CL 102  CO2 22  GLUCOSE 186*  BUN 15  CREATININE 1.07*  CALCIUM 9.6   GFR: Estimated Creatinine Clearance: 41.4 mL/min (A) (by C-G formula based on SCr of 1.07 mg/dL (H)). Liver Function Tests: Recent Labs  Lab 04/13/22 2225  AST 20  ALT 11  ALKPHOS 147*  BILITOT 0.9  PROT 8.7*  ALBUMIN 3.9   Recent Labs  Lab 04/13/22 2225  LIPASE 29   No results for input(s): "AMMONIA" in the last 168 hours. Coagulation Profile: No results for input(s): "INR", "PROTIME" in the last 168 hours. Cardiac Enzymes: No results for input(s): "CKTOTAL", "CKMB", "CKMBINDEX", "TROPONINI" in the last 168 hours. BNP (last 3 results) No results for input(s): "PROBNP" in the last 8760 hours. HbA1C: No results for input(s): "HGBA1C" in the last 72 hours. CBG: No results for input(s): "GLUCAP" in the last 168 hours. Lipid Profile: No results for input(s): "CHOL", "HDL", "LDLCALC", "TRIG", "CHOLHDL", "LDLDIRECT" in the last 72 hours. Thyroid Function Tests: No results for input(s): "TSH", "T4TOTAL", "FREET4", "T3FREE", "THYROIDAB" in the last 72 hours. Anemia Panel: No results for input(s): "VITAMINB12", "FOLATE", "FERRITIN", "TIBC", "IRON", "RETICCTPCT" in the last 72 hours. Urine analysis:    Component Value Date/Time   COLORURINE YELLOW 04/14/2022 0105   APPEARANCEUR CLOUDY (A) 04/14/2022 0105   LABSPEC 1.024 04/14/2022 0105   PHURINE 8.0 04/14/2022  0105   GLUCOSEU 50 (A) 04/14/2022 0105   HGBUR MODERATE (A) 04/14/2022 0105   BILIRUBINUR NEGATIVE 04/14/2022 0105   KETONESUR 5 (A) 04/14/2022 0105   PROTEINUR 30 (A) 04/14/2022 0105   UROBILINOGEN 0.2 06/25/2015 1600   NITRITE NEGATIVE 04/14/2022 0105   LEUKOCYTESUR LARGE (A) 04/14/2022 0105    Radiological Exams on Admission: CT ABDOMEN PELVIS W CONTRAST  Result Date: 04/14/2022 CLINICAL DATA:  Nausea, vomiting, suspected bowel obstruction with acute abdominal pain. EXAM: CT ABDOMEN AND PELVIS WITH CONTRAST TECHNIQUE: Multidetector CT imaging of the abdomen and pelvis was performed using the standard protocol following bolus administration of intravenous contrast. RADIATION DOSE REDUCTION: This exam was performed according to the departmental dose-optimization program which includes automated exposure control, adjustment of the mA and/or kV according to patient size and/or use of iterative reconstruction technique. CONTRAST:  172m OMNIPAQUE IOHEXOL 300 MG/ML  SOLN COMPARISON:  CT with contrast 03/01/2020 CT without contrast 04/10/2021. FINDINGS: Lower chest: No acute abnormality. Hepatobiliary: No focal liver abnormality is seen. Status post cholecystectomy. No biliary dilatation. Pancreas: Unremarkable. No pancreatic ductal dilatation or surrounding inflammatory changes. Spleen: Was previously 13 cm coronal decreased in size now 11.5 cm coronal with interval capsular retraction in the posterior lower spleen consistent with an interval splenic infarct. There is a subcentimeter hypodensity in the superior aspect of the spleen not seen on either prior study. Probable cyst or small hemangioma but nonspecific and too small to characterize. Adrenals/Urinary Tract: Stable 1 cm right adrenal myelolipoma. Unremarkable left adrenal gland. Left kidney with interval volume loss and cortical thinning smaller renal length currently with left renal length 8.5 cm. Right kidney 9.3 cm length with more normal  cortical volume. There are few tiny hypodensities in the right renal cortex which are too small to characterize but unchanged and probably cysts. There is no mass enhancement, calculus or obstructive uropathy. Unremarkable bladder wall and lumen. Stomach/Bowel: There are mild thickened folds in  the stomach and jejunum, a few fluid-filled normal caliber mid abdominal small bowel segments suggesting nonspecific enteritis with slight mucosal enhancement. The appendix is normal. There is mild-to-moderate fecal stasis. The wall of the large bowel is normal except in the rectum which is distended with a stool ball, mildly thickened and inflamed with scattered wall pneumatosis without portal venous gas. Vascular/Lymphatic: There is moderate aortoiliac mixed plaque with involvement of visceral branch arteries most significantly in the proximal renal arteries and the proximal SMA. Reproductive: Uterus and bilateral adnexa are unremarkable. Other: There is a small volume of posterior deep pelvic ascites is probably reactive from proctitis. There is no free air, hemorrhage or abscess. No abdominal ascites. Musculoskeletal: Osteopenia. First noted on spot fluoroscopic images during a kyphoplasty procedure on 03/28/2022, again noted is a moderate L1 compression fracture with 5 mm posterosuperior cortical retropulsion and vertebroplasty cement in the vertebral body. There is no increase in the compression deformity and remaining imaged vertebra are normal in heights. There are mild degenerative changes of the spine. IMPRESSION: 1. Moderate stool distending the rectum with wall thickening and surrounding inflammatory change consistent with stercoral proctitis. 2. There is a small amount of reactive fluid in the posterior deep pelvis and rectal wall pneumatosis, the latter which could be due to proctitis or due to wall ischemia. No perforation is seen. 3. Gastroenteritis. 4. Constipation. 5. Since last CT, interval decreased size  of spleen and scarring in the posterior lower spleen with a subcentimeter too small to characterize hypodensity newly seen in the superior spleen. Probable cyst but nonspecific. 6. Interval volume loss in the left kidney. 7. Aortic atherosclerosis with branch vessel involvement particularly the SMA and renal arteries. 8. L1 compression fracture with augmentation, no interval change in appearance since kyphoplasty 03/28/2022. Electronically Signed   By: Telford Nab M.D.   On: 04/14/2022 01:15    Antonieta Pert MD [facility]  If 7PM-7AM, please contact night-coverage [email or web address]  04/14/2022, 8:55 AM

## 2022-04-14 NOTE — ED Notes (Signed)
Pt in restroom to hold/void enema. Ambulatory without assistance

## 2022-04-14 NOTE — Consult Note (Signed)
Consulting Physician: Nickola Major Vestal Crandall  Referring Provider: Dr. Sedonia Small, ER provider  Chief Complaint: Constipation  Reason for Consult: Fecal impaction with proctitis   Subjective   HPI: Carrie [name] is an 66 y.o. female who is here for abdominal pain.  Kyphoplasty a few weeks ago.  Has had issues with fecal impaction in the past.  Took pain medication for the first week after surgery, no pain medication the last week.  Tried gummy laxatives without results prior to presenting to the emergency room.  Past Medical History:  Diagnosis Date   Anxiety    Arthritis    psoriatic arithritis, DDD    Asthma    Depression    Diabetes mellitus    type 2   Gastritis    Gastroparesis    GERD (gastroesophageal reflux disease)    Hernia    Hyperlipemia    Hyperlipemia    Hypertension    Liver cirrhosis ([place])    Neuropathy    Bil feet re: to Diabetes   Obesity    Osteoporosis    Plantar fasciitis    Stricture and stenosis of esophagus    Stroke ([place])    TIA (transient ischemic attack)    8-10 yrs ago, no problems since   Tuberculosis    tested positive 2011, no symptoms, was on medicine for 6 months    Past Surgical History:  Procedure Laterality Date   BREAST SURGERY Bilateral    biopsies both breast   CARDIAC CATHETERIZATION  08/30/2010   CESAREAN SECTION     x 2    CHOLECYSTECTOMY     COLONOSCOPY     FOOT SURGERY Left    spur removal   HERNIA REPAIR     IR FLUORO GUIDE CV LINE RIGHT  04/12/2021   IR US GUIDE VASC ACCESS RIGHT  04/12/2021   JOINT REPLACEMENT     bilat knee    KNEE SURGERY Bilateral    x 2 joint knee replacements   KYPHOPLASTY N/A 03/28/2022   Procedure: LUMBAR ONE KYPHOPLASTY;  Surgeon: Phylliss Bob, MD;  Location: [place];  Service: Orthopedics;  Laterality: N/A;  LUMBAR ONE KYPHOPLASTY   TOTAL KNEE REVISION Left 10/03/2021   Procedure: LEFT TOTAL KNEE REVISION POLY SWAP WITH IRRIGATION AND DEBRIDEMENT;  Surgeon: Melrose Nakayama, MD;   Location: [facility];  Service: Orthopedics;  Laterality: Left;   UPPER GASTROINTESTINAL ENDOSCOPY     WISDOM TOOTH EXTRACTION      Family History  Problem Relation Age of Onset   Diabetes Other        maternal aunts and uncles   Diabetes Father    Liver disease Father    Emphysema Father        smoked   Diabetes Sister    Diabetes Brother    Sarcoidosis Brother    Emphysema Mother        smoked   Asthma Mother    Asthma Brother    Colon cancer Neg Hx    Esophageal cancer Neg Hx    Stomach cancer Neg Hx    Rectal cancer Neg Hx     Social:  reports that she has never smoked. She has never used smokeless tobacco. She reports that she does not currently use alcohol. She reports that she does not use drugs.  Allergies:  Allergies  Allergen Reactions   Lisinopril Cough    Medications: Current Outpatient Medications  Medication Instructions   Accu-Chek Softclix Lancets lancets Use as instructed  to check blood sugar 2 times daily Dx E11.9   albuterol (VENTOLIN HFA) 108 (90 Base) MCG/ACT inhaler 2 puffs, Inhalation, Every 4 hours PRN   amoxicillin (AMOXIL) 500 mg, Oral, 3 times daily   Blood Glucose Monitoring Suppl (ACCU-CHEK GUIDE ME) w/Device KIT Use as instructed to check blood sugar 2 times daily Dx E11.9   busPIRone (BUSPAR) 5 mg, Oral, 2 times daily   Cosentyx Sensoready (300 MG) 300 mg, Subcutaneous, Every 30 days   doxycycline (VIBRA-TABS) 100 mg, Oral, 2 times daily   esomeprazole (NEXIUM) 40 mg, Oral, 2 times daily before meals   FLOVENT HFA 110 MCG/ACT inhaler 2 puffs, Inhalation, 2 times daily   FLUoxetine (PROZAC) 20 mg, Oral, Daily   glucose blood (ACCU-CHEK GUIDE) test strip USE AS INSTRUCTED TO CHECK BLOOD SUGAR 2 TIMES DAILY   HYDROcodone-acetaminophen (NORCO/VICODIN) 5-325 MG tablet 1 tablet, Oral, Every 6 hours PRN   metFORMIN (GLUCOPHAGE-XR) 500 mg, Oral, Daily   methocarbamol (ROBAXIN) 750 mg, Oral, Every 6 hours PRN   pravastatin (PRAVACHOL) 40 mg, Oral,  Daily   promethazine (PHENERGAN) 12.5 mg, Oral, Every 6 hours PRN   topiramate (TOPAMAX) 100 mg, Oral, Daily    ROS - all of the below systems have been reviewed with the patient and positives are indicated with bold text General: chills, fever or night sweats Eyes: blurry vision or double vision ENT: epistaxis or sore throat Allergy/Immunology: itchy/watery eyes or nasal congestion Hematologic/Lymphatic: bleeding problems, blood clots or swollen lymph nodes Endocrine: temperature intolerance or unexpected weight changes Breast: new or changing breast lumps or nipple discharge Resp: cough, shortness of breath, or wheezing CV: chest pain or dyspnea on exertion GI: as per HPI GU: dysuria, trouble voiding, or hematuria MSK: joint pain or joint stiffness Neuro: TIA or stroke symptoms Derm: pruritus and skin lesion changes Psych: anxiety and depression  Objective   PE Blood pressure 126/70, pulse 94, temperature 97.7 F (36.5 C), temperature source Oral, resp. rate 18, height 5' (1.524 m), weight 55.3 kg, SpO2 92 %. Constitutional: NAD; conversant; no deformities Eyes: Moist conjunctiva; no lid lag; anicteric; PERRL Neck: Trachea midline; no thyromegaly Lungs: Normal respiratory effort; no tactile fremitus CV: RRR; no palpable thrills; no pitting edema GI: Abd Soft, nontender; no palpable hepatosplenomegaly MSK: Normal range of motion of extremities; no clubbing/cyanosis Psychiatric: Appropriate affect; alert and oriented x3 Lymphatic: No palpable cervical or axillary lymphadenopathy  Results for orders placed or performed during the hospital encounter of 04/13/22 (from the past 24 hour(s))  Lipase, blood     Status: None   Collection Time: 04/13/22 10:25 PM  Result Value Ref Range   Lipase 29 11 - 51 U/L  Comprehensive metabolic panel     Status: Abnormal   Collection Time: 04/13/22 10:25 PM  Result Value Ref Range   Sodium 139 135 - 145 mmol/L   Potassium 4.0 3.5 - 5.1  mmol/L   Chloride 102 98 - 111 mmol/L   CO2 22 22 - 32 mmol/L   Glucose, Bld 186 (H) 70 - 99 mg/dL   BUN 15 8 - 23 mg/dL   Creatinine, Ser 1.07 (H) 0.44 - 1.00 mg/dL   Calcium 9.6 8.9 - 10.3 mg/dL   Total Protein 8.7 (H) 6.5 - 8.1 g/dL   Albumin 3.9 3.5 - 5.0 g/dL   AST 20 15 - 41 U/L   ALT 11 0 - 44 U/L   Alkaline Phosphatase 147 (H) 38 - 126 U/L   Total Bilirubin 0.9  0.3 - 1.2 mg/dL   GFR, Estimated 58 (L) >60 mL/min   Anion gap 15 5 - 15  CBC     Status: Abnormal   Collection Time: 04/13/22 10:25 PM  Result Value Ref Range   WBC 8.9 4.0 - 10.5 K/uL   RBC 4.88 3.87 - 5.11 MIL/uL   Hemoglobin 11.7 (L) 12.0 - 15.0 g/dL   HCT 37.7 36.0 - 46.0 %   MCV 77.3 (L) 80.0 - 100.0 fL   MCH 24.0 (L) 26.0 - 34.0 pg   MCHC 31.0 30.0 - 36.0 g/dL   RDW 16.1 (H) 11.5 - 15.5 %   Platelets 398 150 - 400 K/uL   nRBC 0.0 0.0 - 0.2 %  Urinalysis, Routine w reflex microscopic Urine, Clean Catch     Status: Abnormal   Collection Time: 04/14/22  1:05 AM  Result Value Ref Range   Color, Urine YELLOW YELLOW   APPearance CLOUDY (A) CLEAR   Specific Gravity, Urine 1.024 1.005 - 1.030   pH 8.0 5.0 - 8.0   Glucose, UA 50 (A) NEGATIVE mg/dL   Hgb urine dipstick MODERATE (A) NEGATIVE   Bilirubin Urine NEGATIVE NEGATIVE   Ketones, ur 5 (A) NEGATIVE mg/dL   Protein, ur 30 (A) NEGATIVE mg/dL   Nitrite NEGATIVE NEGATIVE   Leukocytes,Ua LARGE (A) NEGATIVE   RBC / HPF 11-20 0 - 5 RBC/hpf   WBC, UA 11-20 0 - 5 WBC/hpf   Bacteria, UA RARE (A) NONE SEEN   Squamous Epithelial / LPF 0-5 0 - 5     Imaging Orders         CT ABDOMEN PELVIS W CONTRAST     1. Moderate stool distending the rectum with wall thickening and surrounding inflammatory change consistent with stercoral proctitis. 2. There is a small amount of reactive fluid in the posterior deep pelvis and rectal wall pneumatosis, the latter which could be due to proctitis or due to wall ischemia. No perforation is seen. 3. Gastroenteritis. 4.  Constipation. 5. Since last CT, interval decreased size of spleen and scarring in the posterior lower spleen with a subcentimeter too small to characterize hypodensity newly seen in the superior spleen. Probable cyst but nonspecific. 6. Interval volume loss in the left kidney. 7. Aortic atherosclerosis with branch vessel involvement particularly the SMA and renal arteries. 8. L1 compression fracture with augmentation, no interval change in appearance since kyphoplasty 03/28/2022.  Assessment and Plan   Carrie [name] is an 66 y.o. female with fecal impaction.  Disimpaction performed by ER team.  Recommend 24 hours observation due to proctitis on imaging.  Aggressive bowel regimen to keep bowels moving in the meantime.  Miralax daily at discharge.  Educate patient on how to properly use an enema if needed at home.  If labwork and symptoms look good Sunday morning, she could be discharged at that time.    ICD-10-CM   1. Fecal impaction in rectum [facility])  K56.41        Felicie Morn, MD  [facility] Use [email or web address] to contact on call provider  New Patient Billing: [phone] - Moderate MDM

## 2022-04-14 NOTE — ED Provider Notes (Signed)
[facility] Emergency Department Provider Note MRN:  [id number]  Arrival date & time: 04/14/22     Chief Complaint   Constipation   History of Present Illness   Carrie [name] is a 66 y.o. year-old female with a history of diabetes presenting to the ED with chief complaint of constipation.  Unable to have bowel movement for the past 2 days, abdominal pain diffusely.  Nausea, vomiting.  Feels like she has a large stool that she cannot pass.  Review of Systems  A thorough review of systems was obtained and all systems are negative except as noted in the HPI and PMH.   Patient's Health History    Past Medical History:  Diagnosis Date   Anxiety    Arthritis    psoriatic arithritis, DDD    Asthma    Depression    Diabetes mellitus    type 2   Gastritis    Gastroparesis    GERD (gastroesophageal reflux disease)    Hernia    Hyperlipemia    Hyperlipemia    Hypertension    Liver cirrhosis ([place])    Neuropathy    Bil feet re: to Diabetes   Obesity    Osteoporosis    Plantar fasciitis    Stricture and stenosis of esophagus    Stroke ([place])    TIA (transient ischemic attack)    8-10 yrs ago, no problems since   Tuberculosis    tested positive 2011, no symptoms, was on medicine for 6 months    Past Surgical History:  Procedure Laterality Date   BREAST SURGERY Bilateral    biopsies both breast   CARDIAC CATHETERIZATION  08/30/2010   CESAREAN SECTION     x 2    CHOLECYSTECTOMY     COLONOSCOPY     FOOT SURGERY Left    spur removal   HERNIA REPAIR     IR FLUORO GUIDE CV LINE RIGHT  04/12/2021   IR US GUIDE VASC ACCESS RIGHT  04/12/2021   JOINT REPLACEMENT     bilat knee    KNEE SURGERY Bilateral    x 2 joint knee replacements   KYPHOPLASTY N/A 03/28/2022   Procedure: LUMBAR ONE KYPHOPLASTY;  Surgeon: Phylliss Bob, MD;  Location: [place];  Service: Orthopedics;  Laterality: N/A;  LUMBAR ONE KYPHOPLASTY   TOTAL KNEE REVISION Left 10/03/2021    Procedure: LEFT TOTAL KNEE REVISION POLY SWAP WITH IRRIGATION AND DEBRIDEMENT;  Surgeon: Melrose Nakayama, MD;  Location[facility];  Service: Orthopedics;  Laterality: Left;   UPPER GASTROINTESTINAL ENDOSCOPY     WISDOM TOOTH EXTRACTION      Family History  Problem Relation Age of Onset   Diabetes Other        maternal aunts and uncles   Diabetes Father    Liver disease Father    Emphysema Father        smoked   Diabetes Sister    Diabetes Brother    Sarcoidosis Brother    Emphysema Mother        smoked   Asthma Mother    Asthma Brother    Colon cancer Neg Hx    Esophageal cancer Neg Hx    Stomach cancer Neg Hx    Rectal cancer Neg Hx     Social History   Socioeconomic History   Marital status: Single    Spouse name: Not on file   Number of children: 2   Years of education: Not on file  Highest education level: Not on file  Occupational History   Occupation: Disabled   Tobacco Use   Smoking status: Never   Smokeless tobacco: Never  Vaping Use   Vaping Use: Never used  Substance and Sexual Activity   Alcohol use: Not Currently    Comment: socially   Drug use: No   Sexual activity: Not Currently    Birth control/protection: Post-menopausal  Other Topics Concern   Not on file  Social History Narrative   [place] daily    Social Determinants of Health   Financial Resource Strain: Not on file  Food Insecurity: Not on file  Transportation Needs: Not on file  Physical Activity: Not on file  Stress: Not on file  Social Connections: Not on file  Intimate Partner Violence: Not on file     Physical Exam   Vitals:   04/13/22 2220 04/14/22 0215  BP: (!) 192/117 (!) 181/121  Pulse: (!) 103 (!) 104  Resp: 18 18  Temp: 97.7 F (36.5 C)   SpO2: 99% 94%    CONSTITUTIONAL: Well-appearing, NAD NEURO/PSYCH:  Alert and oriented x 3, no focal deficits EYES:  eyes equal and reactive ENT/NECK:  no LAD, no JVD CARDIO: Regular rate, well-perfused, normal S1 and S2 PULM:   CTAB no wheezing or rhonchi GI/GU:  non-distended, non-tender MSK/SPINE:  No gross deformities, no edema SKIN:  no rash, atraumatic   *Additional and/or pertinent findings included in MDM below  Diagnostic and Interventional Summary    EKG Interpretation  Date/Time:    Ventricular Rate:    PR Interval:    QRS Duration:   QT Interval:    QTC Calculation:   R Axis:     Text Interpretation:         Labs Reviewed  COMPREHENSIVE METABOLIC PANEL - Abnormal; Notable for the following components:      Result Value   Glucose, Bld 186 (*)    Creatinine, Ser 1.07 (*)    Total Protein 8.7 (*)    Alkaline Phosphatase 147 (*)    GFR, Estimated 58 (*)    All other components within normal limits  CBC - Abnormal; Notable for the following components:   Hemoglobin 11.7 (*)    MCV 77.3 (*)    MCH 24.0 (*)    RDW 16.1 (*)    All other components within normal limits  URINALYSIS, ROUTINE W REFLEX MICROSCOPIC - Abnormal; Notable for the following components:   APPearance CLOUDY (*)    Glucose, UA 50 (*)    Hgb urine dipstick MODERATE (*)    Ketones, ur 5 (*)    Protein, ur 30 (*)    Leukocytes,Ua LARGE (*)    Bacteria, UA RARE (*)    All other components within normal limits  LIPASE, BLOOD    CT ABDOMEN PELVIS W CONTRAST  Final Result      Medications  morphine (PF) 4 MG/ML injection 6 mg (has no administration in time range)  iohexol (OMNIPAQUE) 300 MG/ML solution 100 mL (100 mLs Intravenous Contrast Given 04/14/22 0029)  ondansetron (ZOFRAN) injection 4 mg (4 mg Intravenous Given 04/14/22 0249)  piperacillin-tazobactam (ZOSYN) IVPB 3.375 g (3.375 g Intravenous New Bag/Given 04/14/22 0248)  sodium chloride 0.9 % bolus 1,000 mL (1,000 mLs Intravenous New Bag/Given 04/14/22 0246)     Procedures  /  Critical Care Procedures  ED Course and Medical Decision Making  Initial Impression and Ddx Suspicious for constipation versus fecal impaction.  SBO also considered, awaiting labs,  CT  Past medical/surgical history that increases complexity of ED encounter: None  Interpretation of Diagnostics I personally reviewed the laboratory assessment and my interpretation is as follows: No significant blood count or electrolyte disturbance  CT demonstrating constipation, pneumatosis to the rectum, possibly ischemic in nature  Patient Reassessment and Ultimate Disposition/Management     CT discussed with general surgery, recommending hospital observation, disimpaction.  Patient management required discussion with the following services or consulting groups:  Hospitalist Service and General/Trauma Surgery  Complexity of Problems Addressed Acute illness or injury that poses threat of life of bodily function  Additional Data Reviewed and Analyzed Further history obtained from: Further history from spouse/family member  Additional Factors Impacting ED Encounter Risk Consideration of hospitalization  Barth Kirks. Sedonia Small, [place] [email or web address]  Final Clinical Impressions(s) / ED Diagnoses     ICD-10-CM   1. Fecal impaction in rectum [facility])  K56.41       ED Discharge Orders     None        Discharge Instructions Discussed with and Provided to Patient:   Discharge Instructions   None      Maudie Flakes, MD 04/14/22 [phone]

## 2022-04-14 NOTE — Hospital Course (Addendum)
66 y.o. year-old female with a history of diabetes on metformin, anxiety/depression on BuSpar Prozac, HLD, GERD who kyphoplasty of weeks ago and has had issue with fecal impaction in the past and currently taking pain medication for the first week after surgery presented to the ED with abdominal pain. She was having constipation x2 days.  In the ED CT scan showed moderate stool distending the rectum with wall thickening and surrounding inflammatory changes consistent with stercoral proctitis, reactive fluid in the posterior deep pelvis and rectal wall pneumatosis could be due to proctitis or due to bowel ischemia, no perforation seen, also showed gastroenteritis, constipation, interval decrease size of the spleen, L1 compression fracture with documentation no interval change since kyphoplasty 6/14, aortic atherosclerosis.  She was hemodynamically stable afebrile initially with tachycardia.  Labs showed creatinine 1.2 CRP 2 while normal WBC and anemia with hemoglobin 10.7 g.  ER performed disimpaction, surgery was consulted and requested admission for observation Dr. Teressa Senter on imaging.  UA with leukocytes large, nitrate negative WBC 11-20. Rbc 11-20.

## 2022-04-14 NOTE — ED Notes (Signed)
Assisted pt to bathe and change. Clean linen was provided.

## 2022-04-15 DIAGNOSIS — K5641 Fecal impaction: Secondary | ICD-10-CM | POA: Diagnosis not present

## 2022-04-15 LAB — COMPREHENSIVE METABOLIC PANEL
ALT: 11 U/L (ref 0–44)
AST: 19 U/L (ref 15–41)
Albumin: 3.2 g/dL — ABNORMAL LOW (ref 3.5–5.0)
Alkaline Phosphatase: 115 U/L (ref 38–126)
Anion gap: 8 (ref 5–15)
BUN: 16 mg/dL (ref 8–23)
CO2: 24 mmol/L (ref 22–32)
Calcium: 8.7 mg/dL — ABNORMAL LOW (ref 8.9–10.3)
Chloride: 104 mmol/L (ref 98–111)
Creatinine, Ser: 1.32 mg/dL — ABNORMAL HIGH (ref 0.44–1.00)
GFR, Estimated: 45 mL/min — ABNORMAL LOW (ref >60)
Glucose, Bld: 103 mg/dL — ABNORMAL HIGH (ref 70–99)
Potassium: 4.1 mmol/L (ref 3.5–5.1)
Sodium: 136 mmol/L (ref 135–145)
Total Bilirubin: 0.6 mg/dL (ref 0.3–1.2)
Total Protein: 7.2 g/dL (ref 6.5–8.1)

## 2022-04-15 LAB — CBC
HCT: 31 % — ABNORMAL LOW (ref 36.0–46.0)
Hemoglobin: 9.5 g/dL — ABNORMAL LOW (ref 12.0–15.0)
MCH: 24.4 pg — ABNORMAL LOW (ref 26.0–34.0)
MCHC: 30.6 g/dL (ref 30.0–36.0)
MCV: 79.5 fL — ABNORMAL LOW (ref 80.0–100.0)
Platelets: 273 10*3/uL (ref 150–400)
RBC: 3.9 MIL/uL (ref 3.87–5.11)
RDW: 16.3 % — ABNORMAL HIGH (ref 11.5–15.5)
WBC: 5.5 10*3/uL (ref 4.0–10.5)
nRBC: 0 % (ref 0.0–0.2)

## 2022-04-15 LAB — HIV ANTIBODY (ROUTINE TESTING W REFLEX): HIV Screen 4th Generation wRfx: NONREACTIVE

## 2022-04-15 LAB — GLUCOSE, CAPILLARY
Glucose-Capillary: 168 mg/dL — ABNORMAL HIGH (ref 70–99)
Glucose-Capillary: 170 mg/dL — ABNORMAL HIGH (ref 70–99)

## 2022-04-15 MED ORDER — BISACODYL 10 MG RE SUPP: 10.0000 mg | Freq: Every day | RECTAL | 0 refills | Status: DC | PRN

## 2022-04-15 MED ORDER — DOCUSATE SODIUM 100 MG PO CAPS
100.0000 mg | ORAL_CAPSULE | Freq: Two times a day (BID) | ORAL | Status: DC
  Administered 2022-04-15: 100 mg via ORAL
  Filled 2022-04-15: qty 1

## 2022-04-15 MED ORDER — POLYETHYLENE GLYCOL 3350 17 G PO PACK: 17.0000 g | PACK | Freq: Two times a day (BID) | ORAL | 0 refills | Status: DC

## 2022-04-15 MED ORDER — FLEET ENEMA 7-19 GM/118ML RE ENEM
1.0000 | ENEMA | Freq: Four times a day (QID) | RECTAL | Status: DC
  Administered 2022-04-15: 1 via RECTAL
  Filled 2022-04-15: qty 1

## 2022-04-15 MED ORDER — BISACODYL 10 MG RE SUPP
10.0000 mg | Freq: Every day | RECTAL | Status: DC
  Administered 2022-04-15: 10 mg via RECTAL
  Filled 2022-04-15: qty 1

## 2022-04-15 MED ORDER — DOCUSATE SODIUM 100 MG PO CAPS: 100.0000 mg | ORAL_CAPSULE | Freq: Two times a day (BID) | ORAL | 0 refills | Status: DC

## 2022-04-15 MED ORDER — SODIUM CHLORIDE 0.9 % IV SOLN: INTRAVENOUS | Status: DC

## 2022-04-15 NOTE — Progress Notes (Signed)
Progress Note: General Surgery Service   Chief Complaint/Subjective: Still feels backed up.  Still not a significant amount of bowel function.  Objective: Vital signs in last 24 hours: Temp:  [97.6 F (36.4 C)-99.2 F (37.3 C)] 97.6 F (36.4 C) (07/02 0146) Pulse Rate:  [81-104] 81 (07/02 0146) Resp:  [12-18] 18 (07/02 0146) BP: (119-142)/(65-79) 138/74 (07/02 0146) SpO2:  [90 %-100 %] 98 % (07/02 0146) Last BM Date : 04/14/22  Intake/Output from previous day: No intake/output data recorded. Intake/Output this shift: No intake/output data recorded.  GI: Abd Soft, nontender; no palpable hepatosplenomegaly   Lab Results: CBC  Recent Labs    04/13/22 2225 04/15/22 0554  WBC 8.9 5.5  HGB 11.7* 9.5*  HCT 37.7 31.0*  PLT 398 273   BMET Recent Labs    04/13/22 2225  NA 139  K 4.0  CL 102  CO2 22  GLUCOSE 186*  BUN 15  CREATININE 1.07*  CALCIUM 9.6   PT/INR No results for input(s): "LABPROT", "INR" in the last 72 hours. ABG No results for input(s): "PHART", "HCO3" in the last 72 hours.  Invalid input(s): "PCO2", "PO2"  Anti-infectives: Anti-infectives (From admission, onward)    Start     Dose/Rate Route Frequency Ordered Stop   04/14/22 1000  amoxicillin (AMOXIL) capsule 500 mg        500 mg Oral 3 times daily 04/14/22 0855     04/14/22 1000  doxycycline (VIBRA-TABS) tablet 100 mg        100 mg Oral 2 times daily 04/14/22 0855     04/14/22 0245  piperacillin-tazobactam (ZOSYN) IVPB 3.375 g        3.375 g 100 mL/hr over 30 Minutes Intravenous  Once 04/14/22 0230 04/14/22 4034       Medications: Scheduled Meds:  amoxicillin  500 mg Oral TID   budesonide (PULMICORT) nebulizer solution  0.25 mg Nebulization BID   busPIRone  5 mg Oral BID   doxycycline  100 mg Oral BID   enoxaparin (LOVENOX) injection  40 mg Subcutaneous Q24H   feeding supplement  237 mL Oral BID BM   FLUoxetine  20 mg Oral Daily   insulin aspart  0-6 Units Subcutaneous TID WC    pantoprazole  40 mg Oral BID   polyethylene glycol  17 g Oral BID   pravastatin  40 mg Oral Daily   sodium phosphate  1 enema Rectal Daily   topiramate  100 mg Oral Daily   Continuous Infusions: PRN Meds:.acetaminophen **OR** acetaminophen, albuterol, methocarbamol, ondansetron **OR** ondansetron (ZOFRAN) IV, mouth rinse, promethazine  Assessment/Plan: Ms. Zemanek is a 66 year old female with fecal impaction and some concern for a more complicated proctitis on CT requiring surgery consultation.  More aggressive bowel regimen today.  Abdomen remains soft, nontender.     LOS: 0 days      Carrie Morn, MD  [facility] Use [email or web address] to contact on call provider  Daily Billing: [phone] - Straightforward / Low MDM

## 2022-04-15 NOTE — Plan of Care (Signed)

## 2022-04-15 NOTE — Discharge Summary (Signed)
Physician Discharge Summary  NOVALEIGH KOHLMAN URK:[id number] DOB: May 27, 1956 DOA: 04/13/2022  PCP: Jenel Lucks, PA-C  Admit date: 04/13/2022 Discharge date: 04/15/2022 Recommendations for Outpatient Follow-up:  Follow up with PCP in 1 weeks-call for appointment Please obtain BMP  coming week Discharge Dispo: home Discharge Condition: Stable Code Status:   Code Status: Full Code Diet recommendation:  Diet Order             Diet - low sodium heart healthy           Diet full liquid Room service appropriate? Yes; Fluid consistency: Thin  Diet effective now                    Brief/Interim Summary: 66 y.o. year-old female with a history of diabetes on metformin, anxiety/depression on BuSpar Prozac, HLD, GERD who kyphoplasty of weeks ago and has had issue with fecal impaction in the past and currently taking pain medication for the first week after surgery presented to the ED with abdominal pain. She was having constipation x2 days.  In the ED CT scan showed moderate stool distending the rectum with wall thickening and surrounding inflammatory changes consistent with stercoral proctitis, reactive fluid in the posterior deep pelvis and rectal wall pneumatosis could be due to proctitis or due to bowel ischemia, no perforation seen, also showed gastroenteritis, constipation, interval decrease size of the spleen, L1 compression fracture with documentation no interval change since kyphoplasty 6/14, aortic atherosclerosis.  She was hemodynamically stable afebrile initially with tachycardia.  Labs showed creatinine 1.2 CRP 2 while normal WBC and anemia with hemoglobin 10.7 g.  ER performed disimpaction, surgery was consulted and requested admission for observation Dr. Teressa Senter on imaging.  UA with leukocytes large, nitrate negative WBC 11-20. Rbc 11-20. Placed on enema and suppository on 7/2 am with good BM and feels feels normal no abdomen pain and wants t ogo home today. Discussed with Dr  Sammuel Hines and okay for dc a- will give gi no for call and follow up so that she has no recurrence of similar problems in future and send her on laxatives. Advised oral hydration and repeat bmp next week from pcp    Discharge Diagnoses:  Principal Problem:   Fecal impaction ([place]) Active Problems:   Microcytic anemia   Benign essential HTN   Type 2 diabetes mellitus with complication, without long-term current use of insulin (HCC)   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HCC)   Anxiety and depression   GERD with esophageal stricture and stenosis s/p dilation    Mild neurocognitive disorder   Hyperlipidemia   Proctitis   Stage 3a chronic kidney disease (CKD) (HCC)  Fecal impaction Proctitis-stercoral: Disimpacted in the ED, placed on aggressive bowel regimen, had a bowel movement but still felt backed up-. Placed on enema and suppository on 7/2 am with good BM and feels feels normal no abdomen pain and wants to go home today. Discussed with Dr Sammuel Hines and okay for dc a- will give gi no for call and follow up so that she has no recurrence of similar problems in future and send her on laxatives. Advised oral hydration and repeat bmp next week from pcp   Microcytic anemia: Chronic in the setting of CKD.  Hemoglobin stable at 9-11 gm- check with pcp  Last Labs       Recent Labs  Lab 04/13/22 2225 04/15/22 0554  HGB 11.7* 9.5*  HCT 37.7 31.0*  Benign essential HTN: BP well controlled not on meds Type 2 diabetes mellitus with complication, without long-term current use of insulin: Well-controlled on sliding scale continue holding metformin.  Monitor Last Labs         Recent Labs  Lab 04/14/22 1142 04/14/22 1613 04/14/22 2107 04/15/22 0718  GLUCAP 163* 118* 114* 168*      Cirrhosis of liver not due to alcohol-compensated, LFTs are stable with normal bilirubin, follow-up outpatient Anxiety and depression: Continue BuSpar and Prozac/Topamax GERD with esophageal stricture and  stenosis s/p dilation history: Continue home PPI Mild neurocognitive disorder: At baseline, continue Namenda Left knee prosthesis with infection and inflammatory reaction on amoxicillin and doxycycline followed by ID Psoriatic arthritis continue Cosentyx on maintenance x3 months Hyperlipidemia: Cont pravastatin Stage 3a chronic kidney disease: Previous creatinine has been ranging from 1.0-1.9. given ivf thsi am- she is tolerating po well and advised oral hydration at home and bmp check from pcp in 3-4 days   Consults: CCS  Subjective: AAOX3, HAD GOOD BM and feels comfortable going home  Discharge Exam: Vitals:   04/15/22 0146 04/15/22 0806  BP: 138/74   Pulse: 81   Resp: 18   Temp: 97.6 F (36.4 C)   SpO2: 98% 97%   General: Pt is alert, awake, not in acute distress Cardiovascular: RRR, S1/S2 +, no rubs, no gallops Respiratory: CTA bilaterally, no wheezing, no rhonchi Abdominal: Soft, NT, ND, bowel sounds + Extremities: no edema, no cyanosis  Discharge Instructions  Discharge Instructions     Diet - low sodium heart healthy   Complete by: As directed    Discharge instructions   Complete by: As directed    Please call your pcp and have CBC BMP checked in a 4 days Call GI office for appointment If you have loose stool hold your laxatives  Drink adequate water at home: at least 80 oz liquid daily  and if you are doing physical activity with sweating you should add more oz.   Increase activity slowly   Complete by: As directed    No wound care   Complete by: As directed       Allergies as of 04/15/2022       Reactions   Lisinopril Cough        Medication List     STOP taking these medications    HYDROcodone-acetaminophen 5-325 MG tablet Commonly known as: NORCO/VICODIN       TAKE these medications    Accu-Chek Guide Me w/Device Kit Use as instructed to check blood sugar 2 times daily Dx E11.9   Accu-Chek Guide test strip Generic drug: glucose  blood USE AS INSTRUCTED TO CHECK BLOOD SUGAR 2 TIMES DAILY   Accu-Chek Softclix Lancets lancets Use as instructed to check blood sugar 2 times daily Dx E11.9   albuterol 108 (90 Base) MCG/ACT inhaler Commonly known as: VENTOLIN HFA Inhale 2 puffs into the lungs every 4 (four) hours as needed for wheezing or shortness of breath.   amoxicillin 500 MG capsule Commonly known as: AMOXIL Take 1 capsule (500 mg total) by mouth 3 (three) times daily.   bisacodyl 10 MG suppository Commonly known as: DULCOLAX Place 1 suppository (10 mg total) rectally daily as needed for up to 7 doses for moderate constipation.   busPIRone 5 MG tablet Commonly known as: BUSPAR Take 5 mg by mouth 2 (two) times daily.   Cosentyx Sensoready (300 MG) 150 MG/ML Soaj Generic drug: Secukinumab (300 MG Dose) Inject 300 mg into  the skin every 30 (thirty) days.   docusate sodium 100 MG capsule Commonly known as: COLACE Take 1 capsule (100 mg total) by mouth 2 (two) times daily.   doxycycline 100 MG tablet Commonly known as: VIBRA-TABS Take 1 tablet (100 mg total) by mouth 2 (two) times daily.   esomeprazole 40 MG capsule Commonly known as: NEXIUM Take 40 mg by mouth 2 (two) times daily before a meal.   Flovent HFA 110 MCG/ACT inhaler Generic drug: fluticasone Inhale 2 puffs into the lungs in the morning and at bedtime.   FLUoxetine 20 MG capsule Commonly known as: PROZAC Take 20 mg by mouth daily.   metFORMIN 500 MG 24 hr tablet Commonly known as: GLUCOPHAGE-XR Take 1 tablet (500 mg total) by mouth daily.   methocarbamol 750 MG tablet Commonly known as: ROBAXIN Take 1 tablet (750 mg total) by mouth every 6 (six) hours as needed for muscle spasms.   polyethylene glycol 17 g packet Commonly known as: MIRALAX / GLYCOLAX Take 17 g by mouth 2 (two) times daily.   pravastatin 40 MG tablet Commonly known as: PRAVACHOL Take 40 mg by mouth daily.   promethazine 12.5 MG tablet Commonly known as:  PHENERGAN Take 12.5 mg by mouth every 6 (six) hours as needed for nausea or vomiting.   topiramate 100 MG tablet Commonly known as: TOPAMAX Take 100 mg by mouth daily.        Follow-up Information     Jenel Lucks, PA-C Follow up in 4 day(s).   Specialty: Internal Medicine Why: chekc cbc and bmp Contact information: [place] [address] [phone]         Vena Rua, PA-C. Call.   Specialty: Gastroenterology Why: call office for GI appointment next week Contact information: [address] [address] [phone]                Allergies  Allergen Reactions   Lisinopril Cough    The results of significant diagnostics from this hospitalization (including imaging, microbiology, ancillary and laboratory) are listed below for reference.    Microbiology: No results found for this or any previous visit (from the past 240 hour(s)).  Procedures/Studies: CT ABDOMEN PELVIS W CONTRAST  Result Date: 04/14/2022 CLINICAL DATA:  Nausea, vomiting, suspected bowel obstruction with acute abdominal pain. EXAM: CT ABDOMEN AND PELVIS WITH CONTRAST TECHNIQUE: Multidetector CT imaging of the abdomen and pelvis was performed using the standard protocol following bolus administration of intravenous contrast. RADIATION DOSE REDUCTION: This exam was performed according to the departmental dose-optimization program which includes automated exposure control, adjustment of the mA and/or kV according to patient size and/or use of iterative reconstruction technique. CONTRAST:  118m OMNIPAQUE IOHEXOL 300 MG/ML  SOLN COMPARISON:  CT with contrast 03/01/2020 CT without contrast 04/10/2021. FINDINGS: Lower chest: No acute abnormality. Hepatobiliary: No focal liver abnormality is seen. Status post cholecystectomy. No biliary dilatation. Pancreas: Unremarkable. No pancreatic ductal dilatation or surrounding inflammatory changes. Spleen: Was previously  13 cm coronal decreased in size now 11.5 cm coronal with interval capsular retraction in the posterior lower spleen consistent with an interval splenic infarct. There is a subcentimeter hypodensity in the superior aspect of the spleen not seen on either prior study. Probable cyst or small hemangioma but nonspecific and too small to characterize. Adrenals/Urinary Tract: Stable 1 cm right adrenal myelolipoma. Unremarkable left adrenal gland. Left kidney with interval volume loss and cortical thinning smaller renal length currently with left renal length  8.5 cm. Right kidney 9.3 cm length with more normal cortical volume. There are few tiny hypodensities in the right renal cortex which are too small to characterize but unchanged and probably cysts. There is no mass enhancement, calculus or obstructive uropathy. Unremarkable bladder wall and lumen. Stomach/Bowel: There are mild thickened folds in the stomach and jejunum, a few fluid-filled normal caliber mid abdominal small bowel segments suggesting nonspecific enteritis with slight mucosal enhancement. The appendix is normal. There is mild-to-moderate fecal stasis. The wall of the large bowel is normal except in the rectum which is distended with a stool ball, mildly thickened and inflamed with scattered wall pneumatosis without portal venous gas. Vascular/Lymphatic: There is moderate aortoiliac mixed plaque with involvement of visceral branch arteries most significantly in the proximal renal arteries and the proximal SMA. Reproductive: Uterus and bilateral adnexa are unremarkable. Other: There is a small volume of posterior deep pelvic ascites is probably reactive from proctitis. There is no free air, hemorrhage or abscess. No abdominal ascites. Musculoskeletal: Osteopenia. First noted on spot fluoroscopic images during a kyphoplasty procedure on 03/28/2022, again noted is a moderate L1 compression fracture with 5 mm posterosuperior cortical retropulsion and  vertebroplasty cement in the vertebral body. There is no increase in the compression deformity and remaining imaged vertebra are normal in heights. There are mild degenerative changes of the spine. IMPRESSION: 1. Moderate stool distending the rectum with wall thickening and surrounding inflammatory change consistent with stercoral proctitis. 2. There is a small amount of reactive fluid in the posterior deep pelvis and rectal wall pneumatosis, the latter which could be due to proctitis or due to wall ischemia. No perforation is seen. 3. Gastroenteritis. 4. Constipation. 5. Since last CT, interval decreased size of spleen and scarring in the posterior lower spleen with a subcentimeter too small to characterize hypodensity newly seen in the superior spleen. Probable cyst but nonspecific. 6. Interval volume loss in the left kidney. 7. Aortic atherosclerosis with branch vessel involvement particularly the SMA and renal arteries. 8. L1 compression fracture with augmentation, no interval change in appearance since kyphoplasty 03/28/2022. Electronically Signed   By: Telford Nab M.D.   On: 04/14/2022 01:15   DG THORACOLUMABAR SPINE  Result Date: 03/28/2022 CLINICAL DATA:  Fluoroscopic assistance for kyphoplasty of L1 vertebra EXAM: THORACOLUMBAR SPINE 1V COMPARISON:  MR lumbar spine done on 03/20/2022 FINDINGS: Fluoroscopic images show kyphoplasty in the body of L1 vertebra. Decrease in height of body of L1 vertebra as not changed. Fluoroscopic time was 2 minutes and 1 seconds. Radiation dose was 54.73 mGy. IMPRESSION: Fluoroscopic assistance was provided for kyphoplasty of body of L1 vertebra. Electronically Signed   By: Elmer Picker M.D.   On: 03/28/2022 19:21   DG C-Arm 1-60 Min-No Report  Result Date: 03/28/2022 Fluoroscopy was utilized by the requesting physician.  No radiographic interpretation.    Labs: BNP (last 3 results) No results for input(s): "BNP" in the last 8760 hours. Basic Metabolic  Panel: Recent Labs  Lab 04/13/22 2225 04/15/22 0554  NA 139 136  K 4.0 4.1  CL 102 104  CO2 22 24  GLUCOSE 186* 103*  BUN 15 16  CREATININE 1.07* 1.32*  CALCIUM 9.6 8.7*   Liver Function Tests: Recent Labs  Lab 04/13/22 2225 04/15/22 0554  AST 20 19  ALT 11 11  ALKPHOS 147* 115  BILITOT 0.9 0.6  PROT 8.7* 7.2  ALBUMIN 3.9 3.2*   Recent Labs  Lab 04/13/22 2225  LIPASE 29   No  results for input(s): "AMMONIA" in the last 168 hours. CBC: Recent Labs  Lab 04/13/22 2225 04/15/22 0554  WBC 8.9 5.5  HGB 11.7* 9.5*  HCT 37.7 31.0*  MCV 77.3* 79.5*  PLT 398 273   Cardiac Enzymes: No results for input(s): "CKTOTAL", "CKMB", "CKMBINDEX", "TROPONINI" in the last 168 hours. BNP: Invalid input(s): "POCBNP" CBG: Recent Labs  Lab 04/14/22 1142 04/14/22 1613 04/14/22 2107 04/15/22 0718  GLUCAP 163* 118* 114* 168*   D-Dimer No results for input(s): "DDIMER" in the last 72 hours. Hgb A1c No results for input(s): "HGBA1C" in the last 72 hours. Lipid Profile No results for input(s): "CHOL", "HDL", "LDLCALC", "TRIG", "CHOLHDL", "LDLDIRECT" in the last 72 hours. Thyroid function studies No results for input(s): "TSH", "T4TOTAL", "T3FREE", "THYROIDAB" in the last 72 hours.  Invalid input(s): "FREET3" Anemia work up No results for input(s): "VITAMINB12", "FOLATE", "FERRITIN", "TIBC", "IRON", "RETICCTPCT" in the last 72 hours. Urinalysis    Component Value Date/Time   COLORURINE YELLOW 04/14/2022 0105   APPEARANCEUR CLOUDY (A) 04/14/2022 0105   LABSPEC 1.024 04/14/2022 0105   PHURINE 8.0 04/14/2022 0105   GLUCOSEU 50 (A) 04/14/2022 0105   HGBUR MODERATE (A) 04/14/2022 0105   BILIRUBINUR NEGATIVE 04/14/2022 0105   KETONESUR 5 (A) 04/14/2022 0105   PROTEINUR 30 (A) 04/14/2022 0105   UROBILINOGEN 0.2 06/25/2015 1600   NITRITE NEGATIVE 04/14/2022 0105   LEUKOCYTESUR LARGE (A) 04/14/2022 0105   Sepsis Labs Recent Labs  Lab 04/13/22 2225 04/15/22 0554  WBC 8.9  5.5   Microbiology No results found for this or any previous visit (from the past 240 hour(s)).   Time coordinating discharge: 25 minutes  SIGNED: Antonieta Pert, MD  [facility] 04/15/2022, 11:25 AM  If 7PM-7AM, please contact night-coverage [email or web address]

## 2022-04-15 NOTE — TOC CM/SW Note (Signed)
  Transition of Care [facility]) Screening Note   Patient Details  Name: Carrie [name] Date of Birth: 03-Jan-1956   Transition of Care [facility]) CM/SW Contact:    Ross Ludwig, LCSW Phone Number: 04/15/2022, 12:54 PM    Transition of Care Department [facility]) has reviewed patient and no TOC needs have been identified at this time. We will continue to monitor patient advancement through interdisciplinary progression rounds. If new patient transition needs arise, please place a TOC consult.

## 2022-04-15 NOTE — Plan of Care (Signed)
  Problem: Education: Goal: Knowledge of General Education information will improve Description: Including pain rating scale, medication(s)/side effects and non-pharmacologic comfort measures 04/15/2022 1134 by Milly Jakob, RN Outcome: Adequate for Discharge 04/15/2022 0858 by Milly Jakob, RN Outcome: Progressing   Problem: Health Behavior/Discharge Planning: Goal: Ability to manage health-related needs will improve 04/15/2022 1134 by Milly Jakob, RN Outcome: Adequate for Discharge 04/15/2022 0858 by Milly Jakob, RN Outcome: Progressing   Problem: Clinical Measurements: Goal: Ability to maintain clinical measurements within normal limits will improve 04/15/2022 1134 by Milly Jakob, RN Outcome: Adequate for Discharge 04/15/2022 0858 by Milly Jakob, RN Outcome: Progressing Goal: Will remain free from infection 04/15/2022 1134 by Milly Jakob, RN Outcome: Adequate for Discharge 04/15/2022 0858 by Milly Jakob, RN Outcome: Progressing Goal: Diagnostic test results will improve 04/15/2022 1134 by Milly Jakob, RN Outcome: Adequate for Discharge 04/15/2022 0858 by Milly Jakob, RN Outcome: Progressing Goal: Respiratory complications will improve 04/15/2022 1134 by Milly Jakob, RN Outcome: Adequate for Discharge 04/15/2022 0858 by Milly Jakob, RN Outcome: Progressing Goal: Cardiovascular complication will be avoided 04/15/2022 1134 by Milly Jakob, RN Outcome: Adequate for Discharge 04/15/2022 0858 by Milly Jakob, RN Outcome: Progressing   Problem: Activity: Goal: Risk for activity intolerance will decrease 04/15/2022 1134 by Milly Jakob, RN Outcome: Adequate for Discharge 04/15/2022 0858 by Milly Jakob, RN Outcome: Progressing   Problem: Nutrition: Goal: Adequate nutrition will be maintained 04/15/2022 1134 by Milly Jakob, RN Outcome: Adequate for Discharge 04/15/2022 0858 by Milly Jakob, RN Outcome: Progressing   Problem: Coping: Goal: Level of anxiety will decrease 04/15/2022 1134 by Milly Jakob,  RN Outcome: Adequate for Discharge 04/15/2022 0858 by Milly Jakob, RN Outcome: Progressing   Problem: Elimination: Goal: Will not experience complications related to bowel motility 04/15/2022 1134 by Milly Jakob, RN Outcome: Adequate for Discharge 04/15/2022 0858 by Milly Jakob, RN Outcome: Progressing Goal: Will not experience complications related to urinary retention 04/15/2022 1134 by Milly Jakob, RN Outcome: Adequate for Discharge 04/15/2022 0858 by Milly Jakob, RN Outcome: Progressing   Problem: Pain Managment: Goal: General experience of comfort will improve 04/15/2022 1134 by Milly Jakob, RN Outcome: Adequate for Discharge 04/15/2022 0858 by Milly Jakob, RN Outcome: Progressing   Problem: Safety: Goal: Ability to remain free from injury will improve 04/15/2022 1134 by Milly Jakob, RN Outcome: Adequate for Discharge 04/15/2022 0858 by Milly Jakob, RN Outcome: Progressing   Problem: Skin Integrity: Goal: Risk for impaired skin integrity will decrease 04/15/2022 1134 by Milly Jakob, RN Outcome: Adequate for Discharge 04/15/2022 0858 by Milly Jakob, RN Outcome: Progressing

## 2022-05-07 ENCOUNTER — Other Ambulatory Visit: Payer: Self-pay

## 2022-05-07 ENCOUNTER — Other Ambulatory Visit: Payer: Medicare Other

## 2022-05-07 DIAGNOSIS — T8459XD Infection and inflammatory reaction due to other internal joint prosthesis, subsequent encounter: Secondary | ICD-10-CM

## 2022-05-08 LAB — BASIC METABOLIC PANEL
BUN/Creatinine Ratio: 17 (calc) (ref 6–22)
BUN: 23 mg/dL (ref 7–25)
CO2: 21 mmol/L (ref 20–32)
Calcium: 9.5 mg/dL (ref 8.6–10.4)
Chloride: 105 mmol/L (ref 98–110)
Creat: 1.35 mg/dL — ABNORMAL HIGH (ref 0.50–1.05)
Glucose, Bld: 186 mg/dL — ABNORMAL HIGH (ref 65–99)
Potassium: 4 mmol/L (ref 3.5–5.3)
Sodium: 138 mmol/L (ref 135–146)

## 2022-06-06 ENCOUNTER — Other Ambulatory Visit: Payer: Self-pay

## 2022-06-06 ENCOUNTER — Encounter: Payer: Self-pay | Admitting: Infectious Diseases

## 2022-06-06 ENCOUNTER — Ambulatory Visit (INDEPENDENT_AMBULATORY_CARE_PROVIDER_SITE_OTHER): Payer: Medicare Other | Admitting: Infectious Diseases

## 2022-06-06 VITALS — BP 150/87 | HR 103 | Temp 97.8°F | Ht 60.0 in | Wt 125.0 lb

## 2022-06-06 DIAGNOSIS — T8452XD Infection and inflammatory reaction due to internal left hip prosthesis, subsequent encounter: Secondary | ICD-10-CM | POA: Diagnosis not present

## 2022-06-06 DIAGNOSIS — L4050 Arthropathic psoriasis, unspecified: Secondary | ICD-10-CM | POA: Diagnosis not present

## 2022-06-06 DIAGNOSIS — Z5181 Encounter for therapeutic drug level monitoring: Secondary | ICD-10-CM

## 2022-06-06 DIAGNOSIS — Z96659 Presence of unspecified artificial knee joint: Secondary | ICD-10-CM

## 2022-06-06 NOTE — Progress Notes (Signed)
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/06/2022   Hepatic cirrhosis ([place]) 04/06/2022   AKI superimposed on CKD IIIa 03/11/2022   Lightheadedness 03/11/2022   Stage 3 chronic kidney disease ([place]) 03/11/2022   Anxiety and depression 03/11/2022   Type 2 diabetes mellitus with complication, without long-term current use of insulin ([place]) 03/11/2022   Microcytic anemia 03/11/2022   Elevated d-dimer 03/11/2022   Medication monitoring encounter 11/07/2021   Psoriatic arthritis ([place]) 11/07/2021   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, initial encounter ([place]) 10/03/2021   Pressure injury of skin 04/13/2021   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[place]) 03/16/2020   Bowel obstruction ([place]) 02/08/2020   Leukocytosis 02/08/2020   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   Psoriasis 07/08/2017   Mild neurocognitive disorder 10/18/2016   Diabetes ([place]) 02/16/2016   Burn from the sun 02/25/2015   Chest pain 12/04/2013   Diabetes mellitus type II, uncontrolled 12/04/2013   Benign essential HTN 12/04/2013   Hypoglycemia 12/04/2013   Acute pharyngitis 09/11/2013   Candidiasis of mouth 09/11/2013   HBP (high blood pressure) 04/21/2013   Gastroparesis 02/25/2013   Cough 02/25/2013   Nausea and vomiting 01/06/2013   Nausea alone 05/15/2011   STRICTURE AND STENOSIS OF ESOPHAGUS 10/21/2009   Hyperlipidemia 01/09/2008   OBESITY 01/09/2008   Asthma 01/09/2008   GERD with esophageal stricture and stenosis s/p dilation  01/09/2008   HERNIA, VENTRAL 01/09/2008   ARTHRITIS 01/09/2008   Depression 03/25/2007   ANXIETY 03/25/2007   Current Outpatient Medications  on File Prior to Visit  Medication Sig Dispense Refill   Accu-Chek Softclix Lancets lancets Use as instructed to check blood sugar 2 times daily Dx E11.9 200 each 0   albuterol (VENTOLIN HFA) 108 (90 Base) MCG/ACT inhaler Inhale 2 puffs into the lungs every 4 (four) hours as needed for wheezing or shortness of breath.     amoxicillin (AMOXIL) 500 MG capsule Take 1 capsule (500 mg total) by mouth 3 (three) times daily. 90 capsule 3   bisacodyl (DULCOLAX) 10 MG suppository Place 1 suppository (10 mg total) rectally daily as needed for up to 7 doses for moderate constipation. 7 suppository 0   Blood Glucose Monitoring Suppl (ACCU-CHEK GUIDE ME) w/Device KIT Use as instructed to check blood sugar 2 times daily Dx E11.9 1 kit 0   busPIRone (BUSPAR) 5 MG tablet Take 5 mg by mouth 2 (two) times daily.     COSENTYX SENSOREADY, 300 MG, 150 MG/ML SOAJ Inject 300 mg into the skin every 30 (thirty) days.     docusate sodium (COLACE) 100 MG capsule Take 1 capsule (100 mg total) by mouth 2 (two) times daily. 10 capsule 0   doxycycline (VIBRA-TABS) 100 MG tablet Take 1 tablet (100 mg total) by mouth 2 (two) times daily. 60 tablet 1   esomeprazole (NEXIUM) 40 MG capsule Take 40 mg by mouth 2 (two) times daily before a meal.     FLOVENT HFA 110 MCG/ACT inhaler Inhale 2 puffs into the lungs in the morning and at bedtime.     FLUoxetine (PROZAC) 20 MG capsule Take 20 mg by mouth daily.     glucose  blood (ACCU-CHEK GUIDE) test strip USE AS INSTRUCTED TO CHECK BLOOD SUGAR 2 TIMES DAILY 100 strip 5   metFORMIN (GLUCOPHAGE-XR) 500 MG 24 hr tablet Take 1 tablet (500 mg total) by mouth daily. 90 tablet 3   methocarbamol (ROBAXIN) 750 MG tablet Take 1 tablet (750 mg total) by mouth every 6 (six) hours as needed for muscle spasms. 60 tablet 0   polyethylene glycol (MIRALAX / GLYCOLAX) 17 g packet Take 17 g by mouth 2 (two) times daily. 14 each 0   pravastatin (PRAVACHOL) 40 MG tablet Take 40 mg by mouth daily.      promethazine (PHENERGAN) 12.5 MG tablet Take 12.5 mg by mouth every 6 (six) hours as needed for nausea or vomiting.     topiramate (TOPAMAX) 100 MG tablet Take 100 mg by mouth daily.     No current facility-administered medications on file prior to visit.   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 After last seen in 1/24, I had prescribed her Doxycycline and cefadroxil which she was unable to tolerate. She was feeling poorly with low energy and stomach upset after which I told her to stop taking the cefadroxil and continue taking the doxycycline only. She has been taking Doxycycline only since 2/10 and had no issues so far. She also needs to start cosentyx for her Psoriatic arthritis as he joint pain are getting worse. Discussed with her about the potential risks of infection with the medicine and the decision to start would be a based on her  risk vs benefit ratio.   01/30/22 She has been taking doxycycline and augmentin since last visit. Denies missing doses. But is struggling with nausea and stomach upset. She also had vomiting this morning. Denies abdominal cramps and diarrhea. She tells me she was started on ? New medicine by Rheumatology approx 2 months ago and is currently on monthly dosing. She has been following with GI for Liver cirrhosis and dysphagia. 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 She is also following with Pulmonary for asthma management and Podiatry for dystrophic nails.   04/06/22 Taking doxycycline and amoxicillin twice a day. She is taking medications as instructed but might have missed some pills while in the hospital when she was admitted 6/14 for l1 kyphoplasty for osteoporotic L1 compression # and 5/28-5/30 for dizziness and AKI and seen in the ED on 5/7 for fall. She is also following Rheumatology for psoriatic arthritis and is on monthly cosentyx. She has been taken off of Ozempic from her PCP due to significant reduction in a1c and weight  loss. Discussed need to take the po antibiotics until end of August to complete 6 months of tx for PJI.   06/06/22 Taking doxycyline and amoxicillin as instructed. Denies missing does or any side effects with the antibiotics. Left knee pain is variable from 4-/10. She recently had steroid injection in her left knee early August for pain by Dr Rhona Raider at [facility]. Denies fevers, chills, sweats. Denies nausea, vomiting and diarrhea. No swelling, warmth and tenderness in left knee except pain. Discussed to get labs today and plan to continue or stop abtx thereafter. She is agreeable to the plan.   Recently admitted 7/1-7/2 for fecal impaction and stercoral proctitis.   Review of Systems: ROS all systems reviewed and negative except as above  Past Medical History:  Diagnosis Date   Anxiety    Arthritis    psoriatic arithritis, DDD    Asthma  Depression    Diabetes mellitus    type 2   Gastritis    Gastroparesis    GERD (gastroesophageal reflux disease)    Hernia    Hyperlipemia    Hyperlipemia    Hypertension    Liver cirrhosis (H[facility])    Neuropathy    Bil feet re: to Diabetes   Obesity    Osteoporosis    Plantar fasciitis    Stricture and stenosis of esophagus    Stroke ([place])    TIA (transient ischemic attack)    8-10 yrs ago, no problems since   Tuberculosis    tested positive 2011, no symptoms, was on medicine for 6 months   Past Surgical History:  Procedure Laterality Date   BREAST SURGERY Bilateral    biopsies both breast   CARDIAC CATHETERIZATION  08/30/2010   CESAREAN SECTION     x 2    CHOLECYSTECTOMY     COLONOSCOPY     FOOT SURGERY Left    spur removal   HERNIA REPAIR     IR FLUORO GUIDE CV LINE RIGHT  04/12/2021   IR US GUIDE VASC ACCESS RIGHT  04/12/2021   JOINT REPLACEMENT     bilat knee    KNEE SURGERY Bilateral    x 2 joint knee replacements   KYPHOPLASTY N/A 03/28/2022   Procedure: LUMBAR ONE KYPHOPLASTY;  Surgeon: Phylliss Bob, MD;   Location: [place];  Service: Orthopedics;  Laterality: N/A;  LUMBAR ONE KYPHOPLASTY   TOTAL KNEE REVISION Left 10/03/2021   Procedure: LEFT TOTAL KNEE REVISION POLY SWAP WITH IRRIGATION AND DEBRIDEMENT;  Surgeon: Melrose Nakayama, MD;  Location: [facility];  Service: Orthopedics;  Laterality: Left;   UPPER GASTROINTESTINAL ENDOSCOPY     WISDOM TOOTH EXTRACTION      Social History   Tobacco Use   Smoking status: Never   Smokeless tobacco: Never  Vaping Use   Vaping Use: Never used  Substance Use Topics   Alcohol use: Not Currently    Comment: socially   Drug use: No    Family History  Problem Relation Age of Onset   Diabetes Other        maternal aunts and uncles   Diabetes Father    Liver disease Father    Emphysema Father        smoked   Diabetes Sister    Diabetes Brother    Sarcoidosis Brother    Emphysema Mother        smoked   Asthma Mother    Asthma Brother    Colon cancer Neg Hx    Esophageal cancer Neg Hx    Stomach cancer Neg Hx    Rectal cancer Neg Hx     Allergies  Allergen Reactions   Lisinopril Cough    Health Maintenance  Topic Date Due   PAP SMEAR-Modifier  Never done   Zoster Vaccines- Shingrix (1 of 2) Never done   MAMMOGRAM  08/11/2012   URINE MICROALBUMIN  02/15/2017   OPHTHALMOLOGY EXAM  11/02/2017   COVID-19 Vaccine (4 - Pfizer series) 01/17/2021   FOOT EXAM  03/16/2022   INFLUENZA VACCINE  05/15/2022   HEMOGLOBIN A1C  07/18/2022   COLONOSCOPY (Pts 45-37yr Insurance coverage will need to be confirmed)  11/28/2023   TETANUS/TDAP  02/19/2032   Hepatitis C Screening  Completed   HIV Screening  Completed   HPV VACCINES  Aged Out    Objective: BP (!) 150/87   Pulse (!) 103  Temp 97.8 F (36.6 C) (Temporal)   Ht 5' (1.524 m)   Wt 125 lb (56.7 kg)   LMP  (LMP Unknown)   BMI 24.41 kg/m    Physical Exam Constitutional:      Appearance: Normal appearance.  HENT:     Head: Normocephalic and atraumatic.      Mouth: Mucous membranes  are moist.  Eyes:    Conjunctiva/sclera: Conjunctivae normal.     Pupils: Pupils are equal, round  Cardiovascular:     Rate and Rhythm: Normal rate    Heart sounds:  Pulmonary:     Effort: Pulmonary effort is normal on room air     Breath sounds:  Abdominal:     General: Non distended     Palpations: soft.   Musculoskeletal:        General: Normal range of motion. Left knee with no warmth, swelling and tenderness. Good ROM  Skin:    General: Skin is warm and dry.     Comments:  Neurological:     General: grossly non focal     Mental Status: awake, alert and oriented to person, place, and time.   Psychiatric:        Mood and Affect: Mood normal.   Lab Results Lab Results  Component Value Date   WBC 5.5 04/15/2022   HGB 9.5 (L) 04/15/2022   HCT 31.0 (L) 04/15/2022   MCV 79.5 (L) 04/15/2022   PLT 273 04/15/2022    Lab Results  Component Value Date   CREATININE 1.35 (H) 05/07/2022   BUN 23 05/07/2022   NA 138 05/07/2022   K 4.0 05/07/2022   CL 105 05/07/2022   CO2 21 05/07/2022    Lab Results  Component Value Date   ALT 11 04/15/2022   AST 19 04/15/2022   ALKPHOS 115 04/15/2022   BILITOT 0.6 04/15/2022    Lab Results  Component Value Date   CHOL 188 09/30/2015   HDL 25 (L) 09/30/2015   LDLCALC 116 (H) 09/30/2015   TRIG 235 (H) 09/30/2015   CHOLHDL 7.5 09/30/2015   No results found for: "LABRPR", "RPRTITER"   Problem List Items Addressed This Visit       Musculoskeletal and Integument   Psoriatic arthritis ([place])   Infected prosthetic knee joint ([place]) - Primary   Relevant Orders   CBC (Completed)   Basic metabolic panel (Completed)   C-reactive protein (Completed)   Sedimentation rate (Completed)     Other   Medication monitoring encounter    Assessment/Plan # Left Knee PJI  Continue doxycyline and amoxicillin as is pending labs today Will get records from [employer] - last office note, synovial fluid analysis +/- cultures  Fu  in 2-3 months   # Medication Monitoring Labs today   # Psoriatic arthritis Follows Rheumatology, on monthly cosentyx  I have personally spent 40 minutes involved in face-to-face and non-face-to-face activities for this patient on the day of the visit including staff time, coordination of care and counseling of the patient.   Wilber Oliphant, [facility] 06/06/2022, 3:28 PM

## 2022-06-07 ENCOUNTER — Telehealth: Payer: Self-pay

## 2022-06-07 LAB — BASIC METABOLIC PANEL
BUN/Creatinine Ratio: 15 (calc) (ref 6–22)
BUN: 21 mg/dL (ref 7–25)
CO2: 25 mmol/L (ref 20–32)
Calcium: 9.4 mg/dL (ref 8.6–10.4)
Chloride: 101 mmol/L (ref 98–110)
Creat: 1.44 mg/dL — ABNORMAL HIGH (ref 0.50–1.05)
Glucose, Bld: 92 mg/dL (ref 65–99)
Potassium: 4.3 mmol/L (ref 3.5–5.3)
Sodium: 137 mmol/L (ref 135–146)

## 2022-06-07 LAB — CBC
HCT: 31.1 % — ABNORMAL LOW (ref 35.0–45.0)
Hemoglobin: 10 g/dL — ABNORMAL LOW (ref 11.7–15.5)
MCH: 23 pg — ABNORMAL LOW (ref 27.0–33.0)
MCHC: 32.2 g/dL (ref 32.0–36.0)
MCV: 71.7 fL — ABNORMAL LOW (ref 80.0–100.0)
MPV: 10.8 fL (ref 7.5–12.5)
Platelets: 359 10*3/uL (ref 140–400)
RBC: 4.34 10*6/uL (ref 3.80–5.10)
RDW: 15.7 % — ABNORMAL HIGH (ref 11.0–15.0)
WBC: 7 10*3/uL (ref 3.8–10.8)

## 2022-06-07 LAB — SEDIMENTATION RATE: Sed Rate: 63 mm/h — ABNORMAL HIGH (ref 0–30)

## 2022-06-07 LAB — C-REACTIVE PROTEIN: CRP: 32 mg/L — ABNORMAL HIGH (ref <8.0)

## 2022-06-07 NOTE — Telephone Encounter (Signed)
-----  Message from Rosiland Oz, MD sent at 06/07/2022 12:43 PM EDT ----- Lab noted  Mild elevation in Cr, advise to fu with PCP  Elevated ESR and CRP noted, also has h/o steroid injection in knee, continue antibiotics as discussed until next fu visit

## 2022-06-11 MED ORDER — DOXYCYCLINE HYCLATE 100 MG PO TABS: 100.0000 mg | ORAL_TABLET | Freq: Two times a day (BID) | ORAL | 3 refills | Status: DC

## 2022-06-11 MED ORDER — AMOXICILLIN 500 MG PO CAPS: 500.0000 mg | ORAL_CAPSULE | Freq: Three times a day (TID) | ORAL | 3 refills | Status: DC

## 2022-06-11 NOTE — Addendum Note (Signed)
Addended by: Rosiland Oz on: 06/11/2022 05:10 PM   Modules accepted: Orders

## 2022-06-21 ENCOUNTER — Other Ambulatory Visit: Payer: Self-pay | Admitting: Rheumatology

## 2022-06-21 DIAGNOSIS — M810 Age-related osteoporosis without current pathological fracture: Secondary | ICD-10-CM

## 2022-06-25 ENCOUNTER — Observation Stay (HOSPITAL_COMMUNITY)
Admission: EM | Admit: 2022-06-25 | Discharge: 2022-06-27 | Disposition: A | Discharge status: Home Health | Payer: Medicare Other | Attending: Family Medicine | Admitting: Family Medicine

## 2022-06-25 ENCOUNTER — Emergency Department (HOSPITAL_COMMUNITY): Payer: Medicare Other

## 2022-06-25 ENCOUNTER — Encounter (HOSPITAL_COMMUNITY): Payer: Self-pay | Admitting: Emergency Medicine

## 2022-06-25 DIAGNOSIS — H34812 Central retinal vein occlusion, left eye, with macular edema: Secondary | ICD-10-CM | POA: Insufficient documentation

## 2022-06-25 DIAGNOSIS — E1122 Type 2 diabetes mellitus with diabetic chronic kidney disease: Secondary | ICD-10-CM | POA: Insufficient documentation

## 2022-06-25 DIAGNOSIS — F32A Depression, unspecified: Secondary | ICD-10-CM | POA: Diagnosis present

## 2022-06-25 DIAGNOSIS — R29810 Facial weakness: Secondary | ICD-10-CM

## 2022-06-25 DIAGNOSIS — Z7982 Long term (current) use of aspirin: Secondary | ICD-10-CM | POA: Diagnosis not present

## 2022-06-25 DIAGNOSIS — Z8673 Personal history of transient ischemic attack (TIA), and cerebral infarction without residual deficits: Secondary | ICD-10-CM | POA: Insufficient documentation

## 2022-06-25 DIAGNOSIS — J45909 Unspecified asthma, uncomplicated: Secondary | ICD-10-CM | POA: Insufficient documentation

## 2022-06-25 DIAGNOSIS — I10 Essential (primary) hypertension: Secondary | ICD-10-CM | POA: Diagnosis present

## 2022-06-25 DIAGNOSIS — I6381 Other cerebral infarction due to occlusion or stenosis of small artery: Principal | ICD-10-CM | POA: Insufficient documentation

## 2022-06-25 DIAGNOSIS — I639 Cerebral infarction, unspecified: Secondary | ICD-10-CM | POA: Diagnosis present

## 2022-06-25 DIAGNOSIS — Z96653 Presence of artificial knee joint, bilateral: Secondary | ICD-10-CM | POA: Diagnosis not present

## 2022-06-25 DIAGNOSIS — R2990 Unspecified symptoms and signs involving the nervous system: Secondary | ICD-10-CM

## 2022-06-25 DIAGNOSIS — I69392 Facial weakness following cerebral infarction: Secondary | ICD-10-CM | POA: Insufficient documentation

## 2022-06-25 DIAGNOSIS — Z20822 Contact with and (suspected) exposure to covid-19: Secondary | ICD-10-CM | POA: Diagnosis not present

## 2022-06-25 DIAGNOSIS — Z7985 Long-term (current) use of injectable non-insulin antidiabetic drugs: Secondary | ICD-10-CM | POA: Diagnosis not present

## 2022-06-25 DIAGNOSIS — Z7984 Long term (current) use of oral hypoglycemic drugs: Secondary | ICD-10-CM | POA: Diagnosis not present

## 2022-06-25 DIAGNOSIS — T8454XS 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, sequela: Secondary | ICD-10-CM | POA: Diagnosis not present

## 2022-06-25 DIAGNOSIS — N179 Acute kidney failure, unspecified: Secondary | ICD-10-CM | POA: Diagnosis present

## 2022-06-25 DIAGNOSIS — E1140 Type 2 diabetes mellitus with diabetic neuropathy, unspecified: Secondary | ICD-10-CM | POA: Diagnosis not present

## 2022-06-25 DIAGNOSIS — E119 Type 2 diabetes mellitus without complications: Secondary | ICD-10-CM | POA: Diagnosis present

## 2022-06-25 DIAGNOSIS — H348122 Central retinal vein occlusion, left eye, stable: Secondary | ICD-10-CM

## 2022-06-25 DIAGNOSIS — N1831 Chronic kidney disease, stage 3a: Secondary | ICD-10-CM | POA: Diagnosis not present

## 2022-06-25 DIAGNOSIS — F419 Anxiety disorder, unspecified: Secondary | ICD-10-CM | POA: Diagnosis not present

## 2022-06-25 DIAGNOSIS — Z79899 Other long term (current) drug therapy: Secondary | ICD-10-CM | POA: Diagnosis not present

## 2022-06-25 DIAGNOSIS — H5712 Ocular pain, left eye: Secondary | ICD-10-CM | POA: Diagnosis present

## 2022-06-25 DIAGNOSIS — K7460 Unspecified cirrhosis of liver: Secondary | ICD-10-CM | POA: Diagnosis present

## 2022-06-25 DIAGNOSIS — E118 Type 2 diabetes mellitus with unspecified complications: Secondary | ICD-10-CM | POA: Diagnosis present

## 2022-06-25 DIAGNOSIS — L4050 Arthropathic psoriasis, unspecified: Secondary | ICD-10-CM | POA: Diagnosis present

## 2022-06-25 DIAGNOSIS — R29898 Other symptoms and signs involving the musculoskeletal system: Secondary | ICD-10-CM

## 2022-06-25 DIAGNOSIS — H53132 Sudden visual loss, left eye: Secondary | ICD-10-CM

## 2022-06-25 LAB — PROTIME-INR
INR: 1.2 (ref 0.8–1.2)
Prothrombin Time: 15 seconds (ref 11.4–15.2)

## 2022-06-25 LAB — LIPID PANEL
Cholesterol: 139 mg/dL (ref 0–200)
HDL: 36 mg/dL — ABNORMAL LOW (ref >40)
LDL Cholesterol: 73 mg/dL (ref 0–99)
Total CHOL/HDL Ratio: 3.9 RATIO
Triglycerides: 150 mg/dL — ABNORMAL HIGH (ref <150)
VLDL: 30 mg/dL (ref 0–40)

## 2022-06-25 LAB — BASIC METABOLIC PANEL
Anion gap: 7 (ref 5–15)
BUN: 32 mg/dL — ABNORMAL HIGH (ref 8–23)
CO2: 23 mmol/L (ref 22–32)
Calcium: 8.8 mg/dL — ABNORMAL LOW (ref 8.9–10.3)
Chloride: 111 mmol/L (ref 98–111)
Creatinine, Ser: 1.43 mg/dL — ABNORMAL HIGH (ref 0.44–1.00)
GFR, Estimated: 41 mL/min — ABNORMAL LOW (ref >60)
Glucose, Bld: 95 mg/dL (ref 70–99)
Potassium: 3.8 mmol/L (ref 3.5–5.1)
Sodium: 141 mmol/L (ref 135–145)

## 2022-06-25 LAB — CBC
HCT: 32.1 % — ABNORMAL LOW (ref 36.0–46.0)
Hemoglobin: 9.7 g/dL — ABNORMAL LOW (ref 12.0–15.0)
MCH: 22.9 pg — ABNORMAL LOW (ref 26.0–34.0)
MCHC: 30.2 g/dL (ref 30.0–36.0)
MCV: 75.7 fL — ABNORMAL LOW (ref 80.0–100.0)
Platelets: 282 10*3/uL (ref 150–400)
RBC: 4.24 MIL/uL (ref 3.87–5.11)
RDW: 18.6 % — ABNORMAL HIGH (ref 11.5–15.5)
WBC: 7.5 10*3/uL (ref 4.0–10.5)
nRBC: 0 % (ref 0.0–0.2)

## 2022-06-25 LAB — APTT: aPTT: 37 seconds — ABNORMAL HIGH (ref 24–36)

## 2022-06-25 LAB — RESP PANEL BY RT-PCR (FLU A&B, COVID) ARPGX2
Influenza A by PCR: NEGATIVE
Influenza B by PCR: NEGATIVE
SARS Coronavirus 2 by RT PCR: NEGATIVE

## 2022-06-25 LAB — ETHANOL: Alcohol, Ethyl (B): <10 mg/dL (ref <10)

## 2022-06-25 MED ORDER — ASPIRIN 81 MG PO CHEW
81.0000 mg | CHEWABLE_TABLET | Freq: Every day | ORAL | Status: DC
  Administered 2022-06-25 – 2022-06-27 (×3): 81 mg via ORAL
  Filled 2022-06-25 (×3): qty 1

## 2022-06-25 MED ORDER — IOHEXOL 350 MG/ML SOLN
75.0000 mL | Freq: Once | INTRAVENOUS | Status: AC | PRN
  Administered 2022-06-25: 75 mL via INTRAVENOUS

## 2022-06-25 MED ORDER — CLOPIDOGREL BISULFATE 75 MG PO TABS
75.0000 mg | ORAL_TABLET | Freq: Every day | ORAL | Status: DC
  Administered 2022-06-26 – 2022-06-27 (×2): 75 mg via ORAL
  Filled 2022-06-25 (×3): qty 1

## 2022-06-25 MED ORDER — ASPIRIN 300 MG RE SUPP
300.0000 mg | Freq: Every day | RECTAL | Status: DC
  Filled 2022-06-25 (×3): qty 1

## 2022-06-25 NOTE — ED Triage Notes (Signed)
Patient reports sent from eye doctor for further evaluation. States she was told "eye full of blood" after XR. States R facial droop and L eye pain x2 days. Hx TIA. C/o L eye pain and L head pain at this time.

## 2022-06-25 NOTE — ED Provider Notes (Signed)
Carrie [name]   CSN: [id number] Arrival date & time: 06/25/22  1336     History  Chief Complaint  Patient presents with   Eye Pain    Carrie [name] is a 66 y.o. female.  66 year old female with history of TIA, nonalcoholic cirrhosis, CKD, diabetes on metformin who presents emergency department with left eye blurriness as well as left-sided facial droop.  Patient states that 1 month ago she started noticing decreased visual acuity out of her left eye.  Denies any flashes or floaters.  Says that 1 week ago on Saturday she started experiencing right neck pain and did have some left eye pain.  Says that on 9/6 she was noted by another family member to have left-sided facial droop and decreased sensation on her left face.  Feels that her left arm and leg have been weak as well.  Went to an ophthalmology appointment today that showed that she had a retinal vein occlusion and referred her to the emergency department for additional neurologic symptoms which could be related to stroke.   Eye Pain   Past Medical History:  Diagnosis Date   Anxiety    Arthritis    psoriatic arithritis, DDD    Asthma    Depression    Diabetes mellitus    type 2   Gastritis    Gastroparesis    GERD (gastroesophageal reflux disease)    Hernia    Hyperlipemia    Hyperlipemia    Hypertension    Liver cirrhosis ([place])    Neuropathy    Bil feet re: to Diabetes   Obesity    Osteoporosis    Plantar fasciitis    Stricture and stenosis of esophagus    Stroke ([place])    TIA (transient ischemic attack)    8-10 yrs ago, no problems since   Tuberculosis    tested positive 2011, no symptoms, was on medicine for 6 months      Home Medications Prior to Admission medications   Medication Sig Start Date End Date Taking? Authorizing Provider  albuterol (VENTOLIN HFA) 108 (90 Base) MCG/ACT inhaler Inhale 2 puffs into the lungs every 4 (four) hours as needed for  wheezing or shortness of breath.   Yes [provider]  amoxicillin (AMOXIL) 500 MG capsule Take 1 capsule (500 mg total) by mouth 3 (three) times daily. 06/11/22  Yes Rosiland Oz, MD  aspirin EC 81 MG tablet Take 81 mg by mouth daily. Swallow whole.   Yes [provider]  bisacodyl (DULCOLAX) 10 MG suppository Place 1 suppository (10 mg total) rectally daily as needed for up to 7 doses for moderate constipation. 04/15/22  Yes Antonieta Pert, MD  busPIRone (BUSPAR) 5 MG tablet Take 10 mg by mouth at bedtime.   Yes [provider]  Calcium Carb-Cholecalciferol (CALCIUM CARBONATE-VITAMIN D3 PO) Take 1 tablet by mouth daily.   Yes [provider]  COSENTYX SENSOREADY, 300 MG, 150 MG/ML SOAJ Inject 300 mg into the skin every 30 (thirty) days. 02/27/22  Yes [provider]  docusate sodium (COLACE) 100 MG capsule Take 1 capsule (100 mg total) by mouth 2 (two) times daily. Patient taking differently: Take 100 mg by mouth daily. 04/15/22  Yes Antonieta Pert, MD  doxycycline (VIBRA-TABS) 100 MG tablet Take 1 tablet (100 mg total) by mouth 2 (two) times daily. Patient taking differently: Take 100 mg by mouth daily. 06/11/22  Yes Rosiland Oz, MD  DULoxetine (CYMBALTA) 30 MG  capsule Take 30 mg by mouth daily.   Yes [provider]  esomeprazole (NEXIUM) 40 MG capsule Take 40 mg by mouth 2 (two) times daily before a meal. 01/18/20  Yes [provider]  FLOVENT HFA 110 MCG/ACT inhaler Inhale 2 puffs into the lungs in the morning and at bedtime. 03/17/21  Yes [provider]  metFORMIN (GLUCOPHAGE) 500 MG tablet Take 500 mg by mouth daily.   Yes [provider]  polyethylene glycol (MIRALAX / GLYCOLAX) 17 g packet Take 17 g by mouth 2 (two) times daily. Patient taking differently: Take 17 g by mouth daily as needed for moderate constipation. 04/15/22  Yes Antonieta Pert, MD  pravastatin (PRAVACHOL) 40 MG tablet Take 40 mg by mouth daily.   Yes  [provider]  primidone (MYSOLINE) 250 MG tablet Take 250 mg by mouth at bedtime.   Yes [provider]  Semaglutide, 1 MG/DOSE, (OZEMPIC, 1 MG/DOSE,) 2 MG/1.5ML SOPN Inject 1 mg into the skin once a week.   Yes [provider]  Accu-Chek Softclix Lancets lancets Use as instructed to check blood sugar 2 times daily Dx E11.9 03/23/21   Renato Shin, MD  Blood Glucose Monitoring Suppl (ACCU-CHEK GUIDE ME) w/Device KIT Use as instructed to check blood sugar 2 times daily Dx E11.9 03/23/21   Renato Shin, MD  glucose blood (ACCU-CHEK GUIDE) test strip USE AS INSTRUCTED TO CHECK BLOOD SUGAR 2 TIMES DAILY 12/25/21   Renato Shin, MD  metFORMIN (GLUCOPHAGE-XR) 500 MG 24 hr tablet Take 1 tablet (500 mg total) by mouth daily. Patient not taking: Reported on 06/25/2022 01/16/22   Renato Shin, MD  methocarbamol (ROBAXIN) 750 MG tablet Take 1 tablet (750 mg total) by mouth every 6 (six) hours as needed for muscle spasms. Patient not taking: Reported on 06/25/2022 03/28/22   Justice Britain, PA-C      Allergies    Lisinopril    Review of Systems   Review of Systems  Eyes:  Positive for pain.    Physical Exam Updated Vital Signs BP (!) 168/89   Pulse 81   Temp 97.8 F (36.6 C) (Oral)   Resp (!) 26   LMP  (LMP Unknown)   SpO2 98%  Physical Exam Vitals and nursing [name] reviewed.  Constitutional:      General: She is not in acute distress.    Appearance: She is well-developed.  HENT:     Head: Normocephalic and atraumatic.     Right Ear: External ear normal.     Left Ear: External ear normal.     Nose: Nose normal.     Mouth/Throat:     Mouth: Mucous membranes are moist.     Pharynx: Oropharynx is clear.  Eyes:     Extraocular Movements: Extraocular movements intact.     Conjunctiva/sclera: Conjunctivae normal.     Pupils: Pupils are equal, round, and reactive to light.     Comments: Pupils 7 mm and nonreactive.  Likely from her dilated exam.  Right visual  acuity 20/40.  Left visual acuity counting fingers.  Pulmonary:     Effort: Pulmonary effort is normal. No respiratory distress.  Abdominal:     General: Abdomen is flat. There is no distension.     Palpations: Abdomen is soft.  Musculoskeletal:        General: No swelling.     Cervical back: Normal range of motion and neck supple.  Skin:    General: Skin is warm and  dry.     Capillary Refill: Capillary refill takes less than 2 seconds.  Neurological:     Carrie Status: She is alert and oriented to person, place, and time.     Comments: Carrie [name]: Pupils equal and 7 mm BL, visual fields difficult to assess out of left eye due to poor visual acuity III, IV, VI: EOM intact, no gaze preference or deviation, no nystagmus. V: Decreased sensation to light touch on left face in V1 through V3 distributions VII: Left-sided facial weakness VIII: normal hearing to speech and finger friction IX, X: normal palatal elevation, no uvular deviation XI: 5/5 head turn and 5/5 shoulder shrug bilaterally XII: midline tongue protrusion MOTOR: 5/5 strength in R shoulder flexion, elbow flexion and extension, and grip strength. 4/5 strength in L shoulder flexion, elbow flexion and extension, and grip strength.  5/5 strength in R hip and knee flexion, knee extension, ankle plantar and dorsiflexion. 4/5 strength in L hip and knee flexion, knee extension, ankle plantar and dorsiflexion. SENSORY: Normal sensation to light touch in all extremities COORD: Normal finger to nose and heel to shin, no tremor, no dysmetria STATION: normal stance, no truncal ataxia GAIT: Difficulty ambulating due to left lower extremity weakness   Psychiatric:        Mood and Affect: Mood normal.     ED Results / Procedures / Treatments   Labs (all labs ordered are listed, but only abnormal results are displayed) Labs Reviewed  APTT - Abnormal; Notable for the following components:      Result  Value   aPTT 37 (*)    All other components within normal limits  CBC - Abnormal; Notable for the following components:   Hemoglobin 9.7 (*)    HCT 32.1 (*)    MCV 75.7 (*)    MCH 22.9 (*)    RDW 18.6 (*)    All other components within normal limits  BASIC METABOLIC PANEL - Abnormal; Notable for the following components:   BUN 32 (*)    Creatinine, Ser 1.43 (*)    Calcium 8.8 (*)    GFR, Estimated 41 (*)    All other components within normal limits  LIPID PANEL - Abnormal; Notable for the following components:   Triglycerides 150 (*)    HDL 36 (*)    All other components within normal limits  RESP PANEL BY RT-PCR (FLU A&B, COVID) ARPGX2  PROTIME-INR  ETHANOL  HEMOGLOBIN A1C    EKG EKG Interpretation  Date/Time:  Monday June 25 2022 13:48:57 EDT Ventricular Rate:  96 PR Interval:  142 QRS Duration: 81 QT Interval:  342 QTC Calculation: 433 R Axis:   49 Text Interpretation: Sinus rhythm Loss of TW balance in precordial leads Minimal ST depression, inferior leads limited 2/2 baseline Baseline wander in lead(s) V6 When compared to 03/11/22 no significant change Confirmed by Margaretmary Eddy [phone]) on 06/25/2022 5:08:32 PM  Radiology MR BRAIN WO CONTRAST  Result Date: 06/25/2022 CLINICAL DATA:  Left-sided weakness and vision loss EXAM: MRI HEAD WITHOUT CONTRAST TECHNIQUE: Multiplanar, multiecho pulse sequences of the brain and surrounding structures were obtained without intravenous contrast. COMPARISON:  10/02/2018 FINDINGS: Brain: No acute infarct, mass effect or extra-axial collection. 2-3 foci of chronic microhemorrhage in the left frontal lobe and basal ganglia There is multifocal hyperintense T2-weighted signal within the white matter. Parenchymal volume and CSF spaces are normal. Dilated perivascular spaces of the lentiform nuclei. Midline structures are normal. Vascular: Major flow voids  are preserved. Skull and upper cervical spine: Normal calvarium and skull base.  Visualized upper cervical spine and soft tissues are normal. Sinuses/Orbits:No paranasal sinus fluid levels or advanced mucosal thickening. No mastoid or middle ear effusion. Normal orbits. IMPRESSION: 1. No acute intracranial abnormality. 2. Findings of chronic small vessel ischemia. Electronically Signed   By: Ulyses Jarred M.D.   On: 06/25/2022 21:39   CT Angio Head W or Wo Contrast  Result Date: 06/25/2022 CLINICAL DATA:  L hemibody weakness, L eye vision loss, R neck pain EXAM: CT ANGIOGRAPHY HEAD AND NECK TECHNIQUE: Multidetector CT imaging of the head and neck was performed using the standard protocol during bolus administration of intravenous contrast. Multiplanar CT image reconstructions and MIPs were obtained to evaluate the vascular anatomy. Carotid stenosis measurements (when applicable) are obtained utilizing NASCET criteria, using the distal internal carotid diameter as the denominator. RADIATION DOSE REDUCTION: This exam was performed according to the departmental dose-optimization program which includes automated exposure control, adjustment of the mA and/or kV according to patient size and/or use of iterative reconstruction technique. CONTRAST:  35m OMNIPAQUE IOHEXOL 350 MG/ML SOLN COMPARISON:  CT head and MRI/MRA Feb 25, 2011. FINDINGS: CT HEAD FINDINGS Brain: Small age indeterminate infarct in the left thalamus. No evidence of acute hemorrhage, hydrocephalus, extra-axial collection or mass lesion/mass effect. Remote lacunar infarct versus dilated perivascular spaces in the inferior basal ganglia bilaterally. Additional patchy white matter hypodensities in the white matter, nonspecific but compatible with chronic microvascular ischemic disease. Vascular: See below. Skull: No acute fracture. Sinuses/Orbits: Right sphenoid sinus mucosal thickening. No acute orbital findings. Other: No mastoid effusions. Review of the MIP images confirms the above findings CTA NECK FINDINGS Aortic arch: Great  vessel origins are patent without significant stenosis. Right carotid system: Atherosclerosis at the carotid bifurcation without greater than 50% stenosis. No evidence of dissection. Left carotid system: Atherosclerosis at the carotid bifurcation without greater than 50% stenosis. No evidence of dissection. Vertebral arteries: Codominant. No evidence of dissection, stenosis (50% or greater), or occlusion. Skeleton: Negative. Other neck: No acute findings. Upper chest: Visualized lung apices are clear. Review of the MIP images confirms the above findings CTA HEAD FINDINGS Anterior circulation: Bilateral intracranial ICAs, MCAs, and ACAs are patent without proximal hemodynamically significant stenosis. Posterior circulation: Moderate right and mild left intradural vertebral artery stenosis due to atherosclerosis. Intradural vertebral arteries, basilar artery and bilateral posterior cerebral arteries are patent. Moderate to severe stenosis of the right P2 PCA. Venous sinuses: As permitted by contrast timing, patent. Review of MIP images confirms the above findings IMPRESSION: 1. Small age indeterminate infarct in the left thalamus. An MRI could further evaluate if clinically warranted. 2.  No emergent large vessel occlusion. 3. Moderate to severe stenosis of the right P2 PCA. 4. Moderate right and mild left intradural vertebral artery stenosis. Electronically Signed   By: FMargaretha SheffieldM.D.   On: 06/25/2022 18:22   CT Angio Neck W and/or Wo Contrast  Result Date: 06/25/2022 CLINICAL DATA:  L hemibody weakness, L eye vision loss, R neck pain EXAM: CT ANGIOGRAPHY HEAD AND NECK TECHNIQUE: Multidetector CT imaging of the head and neck was performed using the standard protocol during bolus administration of intravenous contrast. Multiplanar CT image reconstructions and MIPs were obtained to evaluate the vascular anatomy. Carotid stenosis measurements (when applicable) are obtained utilizing NASCET criteria, using  the distal internal carotid diameter as the denominator. RADIATION DOSE REDUCTION: This exam was performed according to the departmental dose-optimization program which includes  automated exposure control, adjustment of the mA and/or kV according to patient size and/or use of iterative reconstruction technique. CONTRAST:  61m OMNIPAQUE IOHEXOL 350 MG/ML SOLN COMPARISON:  CT head and MRI/MRA Feb 25, 2011. FINDINGS: CT HEAD FINDINGS Brain: Small age indeterminate infarct in the left thalamus. No evidence of acute hemorrhage, hydrocephalus, extra-axial collection or mass lesion/mass effect. Remote lacunar infarct versus dilated perivascular spaces in the inferior basal ganglia bilaterally. Additional patchy white matter hypodensities in the white matter, nonspecific but compatible with chronic microvascular ischemic disease. Vascular: See below. Skull: No acute fracture. Sinuses/Orbits: Right sphenoid sinus mucosal thickening. No acute orbital findings. Other: No mastoid effusions. Review of the MIP images confirms the above findings CTA NECK FINDINGS Aortic arch: Great vessel origins are patent without significant stenosis. Right carotid system: Atherosclerosis at the carotid bifurcation without greater than 50% stenosis. No evidence of dissection. Left carotid system: Atherosclerosis at the carotid bifurcation without greater than 50% stenosis. No evidence of dissection. Vertebral arteries: Codominant. No evidence of dissection, stenosis (50% or greater), or occlusion. Skeleton: Negative. Other neck: No acute findings. Upper chest: Visualized lung apices are clear. Review of the MIP images confirms the above findings CTA HEAD FINDINGS Anterior circulation: Bilateral intracranial ICAs, MCAs, and ACAs are patent without proximal hemodynamically significant stenosis. Posterior circulation: Moderate right and mild left intradural vertebral artery stenosis due to atherosclerosis. Intradural vertebral arteries, basilar  artery and bilateral posterior cerebral arteries are patent. Moderate to severe stenosis of the right P2 PCA. Venous sinuses: As permitted by contrast timing, patent. Review of MIP images confirms the above findings IMPRESSION: 1. Small age indeterminate infarct in the left thalamus. An MRI could further evaluate if clinically warranted. 2.  No emergent large vessel occlusion. 3. Moderate to severe stenosis of the right P2 PCA. 4. Moderate right and mild left intradural vertebral artery stenosis. Electronically Signed   By: FMargaretha SheffieldM.D.   On: 06/25/2022 18:22    Procedures Procedures   Medications Ordered in ED Medications  aspirin chewable tablet 81 mg (81 mg Oral Given 06/25/22 2253)    Or  aspirin suppository 300 mg ( Rectal See Alternative 06/25/22 2253)  clopidogrel (PLAVIX) tablet 75 mg (has no administration in time range)  iohexol (OMNIPAQUE) 350 MG/ML injection 75 mL (75 mLs Intravenous Contrast Given 06/25/22 1749)    ED Course/ Medical Decision Making/ A&P Clinical Course as of 06/26/22 0014  Mon Jun 25, 2022  1706 Spoke with Dr. NSammie Benchfrom ophthalmology who recommends no acute interventions for her retinal vein occlusion but to follow-up in clinic tomorrow. [RP]  1905 Spoke with Dr BCurly Shoresfrom neurology who recommends MRI of the brain at this time.   [RP]  2221 Spoke with on call neurology (Dr HDewitt Rota who feels that pt may still be having a stroke/TIA despite negative MRI. Will admit for further management.   [RP]  2245 Spoke with Dr TFlossie Buffyfrom medicine who will evaluate the patient for admission. [RP]    Clinical Course User Index [RP] PFransico Meadow MD                           Medical Decision Making Amount and/or Complexity of Data Reviewed Labs: ordered. Radiology: ordered.  Risk Prescription drug management. Decision regarding hospitalization.   DDUBLIN CANTEROis a 66year old female with history of TIA, nonalcoholic cirrhosis, CKD, diabetes on  metformin who presents emergency department with left eye blurriness as well  as left-sided facial droop.  Initial Ddx:  Stroke, ICH, Brain mass, carotid dissection, CRAO, CRVO  MDM:  Feel the patient likely had a lacunar stroke in addition to her central retinal vein occlusion.  She is out of the window for tPA at this point time.  ICH also in the differential as is brain mass but feel these are less likely.  With the patient's neck pain on the right side considering carotid dissection so we will obtain CTA to further evaluate.  Plan:  Labs CT head without contrast CTA head and neck Neurology consult  ED Summary:  Patient underwent the above work-up that did not show evidence of an acute stroke.  No additional stroke found to explain her symptoms.  MRI was obtained which also did not show acute stroke.  After discussing with neurology they feel that her symptoms could still be due to a small stroke or TIA that is not showing up on MRI so requested that she be admitted to hospital for further management.  Patient was admitted to the hospital service for further evaluation.  Dispo: Admit to Floor   Records reviewed OP Notes The following labs were independently interpreted: Chemistry I independently visualized the following imaging with scope of interpretation limited to determining acute life threatening conditions related to emergency care: CT Head, which revealed  no acute bleed   Consults: Neurology  Final Clinical Impression(s) / ED Diagnoses Final diagnoses:  Stroke-like symptoms  Facial droop  Weakness of left lower extremity  Sudden visual loss of left eye    Rx / DC Orders ED Discharge Orders     None         Fransico Meadow, MD 06/26/22 [phone]

## 2022-06-25 NOTE — Progress Notes (Signed)
Brief Neuro note:  Briefly, [name] s a 66 y.o. with hx of cirrhosis, HTN, DM2, HLD, CKD who presents with 1 week hx of L facial droop, LUE and LLE weakness. She had MRI Brain without contrast which was negative for an acute stroke. CTA with moderate to severe R PCA P2 stenosis and moderate R V4 stenosis.  Most likely diagnosis is an MRI negative stroke. Unlikely for spinal cord lesion to cause a facial droop.   - Frequent Neuro checks per stroke unit protocol - Recommend obtaining TTE - Recommend obtaining Lipid panel with LDL - Please start statin if LDL > 70 - Recommend HbA1c - Antithrombotic - Aspirin 63m daily along with plavix 753mdaily x 21days, followed by Aspirin 8159maily alone. - Recommend DVT ppx - SBP goal - gradual normotension. - Recommend Telemetry monitoring for arrythmia - Recommend bedside swallow screen prior to PO intake. - Stroke education booklet - Recommend PT/OT/SLP consult - Recommend Urine Tox screen.  SalMonticelloger Number [id number]

## 2022-06-26 ENCOUNTER — Encounter (HOSPITAL_COMMUNITY): Payer: Self-pay | Admitting: Family Medicine

## 2022-06-26 ENCOUNTER — Observation Stay (HOSPITAL_BASED_OUTPATIENT_CLINIC_OR_DEPARTMENT_OTHER): Payer: Medicare Other

## 2022-06-26 ENCOUNTER — Other Ambulatory Visit: Payer: Self-pay

## 2022-06-26 DIAGNOSIS — I10 Essential (primary) hypertension: Secondary | ICD-10-CM | POA: Diagnosis not present

## 2022-06-26 DIAGNOSIS — H348122 Central retinal vein occlusion, left eye, stable: Secondary | ICD-10-CM

## 2022-06-26 DIAGNOSIS — K7460 Unspecified cirrhosis of liver: Secondary | ICD-10-CM | POA: Diagnosis not present

## 2022-06-26 DIAGNOSIS — I639 Cerebral infarction, unspecified: Secondary | ICD-10-CM | POA: Diagnosis not present

## 2022-06-26 DIAGNOSIS — I6389 Other cerebral infarction: Secondary | ICD-10-CM | POA: Diagnosis not present

## 2022-06-26 DIAGNOSIS — I6381 Other cerebral infarction due to occlusion or stenosis of small artery: Secondary | ICD-10-CM | POA: Diagnosis not present

## 2022-06-26 DIAGNOSIS — N179 Acute kidney failure, unspecified: Secondary | ICD-10-CM | POA: Diagnosis not present

## 2022-06-26 HISTORY — DX: Central retinal vein occlusion, left eye, stable: H34.8122

## 2022-06-26 LAB — RAPID URINE DRUG SCREEN, HOSP PERFORMED
Amphetamines: NOT DETECTED
Barbiturates: POSITIVE — AB
Benzodiazepines: NOT DETECTED
Cocaine: NOT DETECTED
Opiates: NOT DETECTED
Tetrahydrocannabinol: NOT DETECTED

## 2022-06-26 LAB — ECHOCARDIOGRAM COMPLETE
AR max vel: 2.6 cm2
AV Area VTI: 2.66 cm2
AV Area mean vel: 2.49 cm2
AV Mean grad: 3 mmHg
AV Peak grad: 4.8 mmHg
Ao pk vel: 1.1 m/s
Area-P 1/2: 5.88 cm2
Height: 60 in
S' Lateral: 2.9 cm
Weight: 2000 oz

## 2022-06-26 LAB — HEMOGLOBIN A1C
Hgb A1c MFr Bld: 6 % — ABNORMAL HIGH (ref 4.8–5.6)
Mean Plasma Glucose: 125.5 mg/dL

## 2022-06-26 MED ORDER — PRIMIDONE 250 MG PO TABS
250.0000 mg | ORAL_TABLET | Freq: Every day | ORAL | Status: DC
  Administered 2022-06-26 (×2): 250 mg via ORAL
  Filled 2022-06-26 (×2): qty 1

## 2022-06-26 MED ORDER — ACETAMINOPHEN 160 MG/5ML PO SOLN: 650.0000 mg | ORAL | Status: DC | PRN

## 2022-06-26 MED ORDER — FLUTICASONE PROPIONATE HFA 110 MCG/ACT IN AERO: 2.0000 | INHALATION_SPRAY | Freq: Two times a day (BID) | RESPIRATORY_TRACT | Status: DC

## 2022-06-26 MED ORDER — DOCUSATE SODIUM 100 MG PO CAPS
100.0000 mg | ORAL_CAPSULE | Freq: Every day | ORAL | Status: DC
  Administered 2022-06-26 – 2022-06-27 (×2): 100 mg via ORAL
  Filled 2022-06-26 (×2): qty 1

## 2022-06-26 MED ORDER — SODIUM CHLORIDE 0.9 % IV BOLUS
500.0000 mL | Freq: Once | INTRAVENOUS | Status: AC
  Administered 2022-06-26: 500 mL via INTRAVENOUS

## 2022-06-26 MED ORDER — BUSPIRONE HCL 5 MG PO TABS
10.0000 mg | ORAL_TABLET | Freq: Every day | ORAL | Status: DC
  Administered 2022-06-26 (×2): 10 mg via ORAL
  Filled 2022-06-26: qty 2
  Filled 2022-06-26: qty 1

## 2022-06-26 MED ORDER — DOXYCYCLINE HYCLATE 100 MG PO TABS
100.0000 mg | ORAL_TABLET | Freq: Two times a day (BID) | ORAL | Status: DC
  Administered 2022-06-26 – 2022-06-27 (×4): 100 mg via ORAL
  Filled 2022-06-26 (×4): qty 1

## 2022-06-26 MED ORDER — ENOXAPARIN SODIUM 40 MG/0.4ML IJ SOSY
40.0000 mg | PREFILLED_SYRINGE | INTRAMUSCULAR | Status: DC
  Administered 2022-06-26 – 2022-06-27 (×2): 40 mg via SUBCUTANEOUS
  Filled 2022-06-26 (×2): qty 0.4

## 2022-06-26 MED ORDER — ACETAMINOPHEN 650 MG RE SUPP: 650.0000 mg | RECTAL | Status: DC | PRN

## 2022-06-26 MED ORDER — ACETAMINOPHEN 325 MG PO TABS: 650.0000 mg | ORAL_TABLET | ORAL | Status: DC | PRN

## 2022-06-26 MED ORDER — STROKE: EARLY STAGES OF RECOVERY BOOK
Freq: Once | Status: AC
  Filled 2022-06-26: qty 1

## 2022-06-26 MED ORDER — ATORVASTATIN CALCIUM 40 MG PO TABS
40.0000 mg | ORAL_TABLET | Freq: Every day | ORAL | Status: DC
  Administered 2022-06-26 – 2022-06-27 (×2): 40 mg via ORAL
  Filled 2022-06-26 (×2): qty 1

## 2022-06-26 MED ORDER — BUDESONIDE 0.25 MG/2ML IN SUSP
0.2500 mg | Freq: Two times a day (BID) | RESPIRATORY_TRACT | Status: DC
  Administered 2022-06-26 – 2022-06-27 (×3): 0.25 mg via RESPIRATORY_TRACT
  Filled 2022-06-26 (×3): qty 2

## 2022-06-26 MED ORDER — DULOXETINE HCL 30 MG PO CPEP
30.0000 mg | ORAL_CAPSULE | Freq: Every day | ORAL | Status: DC
  Administered 2022-06-26 – 2022-06-27 (×2): 30 mg via ORAL
  Filled 2022-06-26 (×2): qty 1

## 2022-06-26 MED ORDER — ONDANSETRON HCL 4 MG/2ML IJ SOLN: 4.0000 mg | Freq: Four times a day (QID) | INTRAMUSCULAR | Status: DC | PRN

## 2022-06-26 MED ORDER — PANTOPRAZOLE SODIUM 40 MG PO TBEC
40.0000 mg | DELAYED_RELEASE_TABLET | Freq: Every day | ORAL | Status: DC
  Administered 2022-06-26 – 2022-06-27 (×2): 40 mg via ORAL
  Filled 2022-06-26 (×2): qty 1

## 2022-06-26 MED ORDER — AMOXICILLIN 250 MG PO CAPS
500.0000 mg | ORAL_CAPSULE | Freq: Three times a day (TID) | ORAL | Status: DC
  Administered 2022-06-26 – 2022-06-27 (×3): 500 mg via ORAL
  Filled 2022-06-26 (×3): qty 2

## 2022-06-26 NOTE — Progress Notes (Signed)
Mobility Specialist Cancellation/Refusal Note:    06/26/22 1227  Mobility  Activity Refused mobility     Reason for Cancellation/Refusal: Pt declined mobility at this time. Pt eating lunch. Will check back as schedule permits.      [facility]

## 2022-06-26 NOTE — Assessment & Plan Note (Signed)
Continue BuSpar and Cymbalta

## 2022-06-26 NOTE — Progress Notes (Signed)
PROGRESS NOTE    Carrie [name]  OBS:[id number] DOB: [birth year] DOA: 06/25/2022 PCP: Bartholome Bill, MD    Brief Narrative:  Carrie [name] is a 66 y.o. female with medical history significant of hypertension, cirrhosis, type 2 diabetes with retinopathy, gastroparesis, psoriatic arthritis, CKD 3a, obesity, anxiety and depression who was sent to ED by her ophthalmologist for concerns of left-sided facial droop and retinal vein occlusion of the left eye.   Assessment and Plan: * Stroke [facility]) - Presented with a week of left-sided facial droop and was sent by ophthalmology today for left retinal vein occlusion.  Will need follow-up with ophthalmology outpatient. -CTA head and neck shows small age-indeterminate infarct in the left thalamus.  No large vessel occlusion.  Moderate to severe stenosis of the right P2 PCA.  Moderate right and mild left intradural vertebral artery stenosis. - MRI brain was negative.  However, neurology suspects MRI negative for stroke.  Recommends starting aspirin and Plavix for 21 days and then monotherapy with aspirin. -echo pending -Lipid panel showed LDL of 73- previously on pravastatin.  We will switch to high intensity statin. -Hemoglobin A1c <7 -PT/OT/SLT- home health? -Frequent neuro checks and keep on telemetry -Allow for gradual normotension per Neuro -home in AM after PT/OT re-eval  AKI superimposed on CKD IIIa Creatinine elevated 1.43 with a prior range around 1.2-1.3. 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  Followed by ID.  Type 2 diabetes mellitus with complication, without long-term current use of insulin (HCC) -sugars controlled  Cirrhosis of liver not due to alcohol  ([place]) Compensated.  Followed by GI outpatient.  Anxiety and depression Continue BuSpar and Cymbalta  Psoriatic arthritis ([place]) On Cosentyx       DVT prophylaxis: enoxaparin (LOVENOX) injection 40 mg Start: 06/26/22 1000    Code Status: Full Code Family Communication:   Disposition Plan:  Level of care: Telemetry Status is: Observation The patient will require care spanning > 2 midnights and should be moved to inpatient because: needs continues PT/OT prior to d/c    Consultants:  neuro   Subjective: No chest pain, no SOB  Objective: Vitals:   06/26/22 0300 06/26/22 0507 06/26/22 0631 06/26/22 0930  BP: (!) 186/94 (!) 159/89    Pulse: 80 88    Resp: (!) 24 19    Temp:      TempSrc:      SpO2: 100% 98%  95%  Weight:   56.7 kg   Height:   5' (1.524 m)    No intake or output data in the 24 hours ending 06/26/22 1116 Filed Weights   06/26/22 0631  Weight: 56.7 kg    Examination:   General: Appearance:    Well developed, well nourished female in no acute distress     Lungs:     Clear to auscultation bilaterally, respirations unlabored  Heart:    Normal heart rate. Normal rhythm. No murmurs, rubs, or gallops.    MS:   All extremities are intact.    Neurologic:   Awake, alert, oriented x 3. Mild left sided weakness in UE       Data Reviewed: I have personally reviewed following labs and imaging studies  CBC: Recent Labs  Lab 06/25/22 1629  WBC 7.5  HGB 9.7*  HCT 32.1*  MCV 75.7*  PLT 322   Basic Metabolic Panel: Recent Labs  Lab 06/25/22 1629  NA 141  K 3.8  CL 111  CO2 23  GLUCOSE 95  BUN 32*  CREATININE 1.43*  CALCIUM 8.8*   GFR: Estimated Creatinine Clearance: 31.4 mL/min (A) (by C-G formula based on SCr of 1.43 mg/dL (H)). Liver Function Tests: No results for input(s): "AST", "ALT", "ALKPHOS", "BILITOT", "PROT", "ALBUMIN" in the last 168 hours. No results for input(s): "LIPASE", "AMYLASE" in the last 168 hours. No results for  input(s): "AMMONIA" in the last 168 hours. Coagulation Profile: Recent Labs  Lab 06/25/22 1629  INR 1.2   Cardiac Enzymes: No results for input(s): "CKTOTAL", "CKMB", "CKMBINDEX", "TROPONINI" in the last 168 hours. BNP (last 3 results) No results for input(s): "PROBNP" in the last 8760 hours. HbA1C: Recent Labs    06/25/22 1629  HGBA1C 6.0*   CBG: No results for input(s): "GLUCAP" in the last 168 hours. Lipid Profile: Recent Labs    06/25/22 1629  CHOL 139  HDL 36*  LDLCALC 73  TRIG 150*  CHOLHDL 3.9   Thyroid Function Tests: No results for input(s): "TSH", "T4TOTAL", "FREET4", "T3FREE", "THYROIDAB" in the last 72 hours. Anemia Panel: No results for input(s): "VITAMINB12", "FOLATE", "FERRITIN", "TIBC", "IRON", "RETICCTPCT" in the last 72 hours. Sepsis Labs: No results for input(s): "PROCALCITON", "LATICACIDVEN" in the last 168 hours.  Recent Results (from the past 240 hour(s))  Resp Panel by RT-PCR (Flu A&B, Covid) Anterior Nasal Swab     Status: None   Collection Time: 06/25/22  4:30 PM   Specimen: Anterior Nasal Swab  Result Value Ref Range Status   SARS Coronavirus 2 by RT PCR NEGATIVE NEGATIVE Final    Comment: (NOTE) SARS-CoV-2 target nucleic acids are NOT DETECTED.  The SARS-CoV-2 RNA is generally detectable in upper respiratory specimens during the acute phase of infection. The lowest concentration of SARS-CoV-2 viral copies this assay can detect is 138 copies/mL. A negative result does not preclude SARS-Cov-2 infection and should not be used as the sole basis for treatment or other patient management decisions. A negative result may occur with  improper specimen collection/handling, submission of specimen other than nasopharyngeal swab, presence of viral mutation(s) within the areas targeted by this assay, and inadequate number of viral copies(<138 copies/mL). A negative result must be combined with clinical observations, patient history, and  epidemiological information. The expected result is Negative.  Fact Sheet for Patients:  EntrepreneurPulse.com.au  Fact Sheet for Healthcare Providers:  IncredibleEmployment.be  This test is no t yet approved or cleared by the [place] FDA and  has been authorized for detection and/or diagnosis of SARS-CoV-2 by FDA under an Emergency Use Authorization (EUA). This EUA will remain  in effect (meaning this test can be used) for the duration of the COVID-19 declaration under Section 564(b)(1) of the Act, 21 U.S.C.section 360bbb-3(b)(1), unless the authorization is terminated  or revoked sooner.       Influenza A by PCR NEGATIVE NEGATIVE Final   Influenza B by PCR NEGATIVE NEGATIVE Final    Comment: (NOTE) The Xpert Xpress SARS-CoV-2/FLU/RSV plus assay is intended as an aid in the diagnosis of influenza from Nasopharyngeal swab specimens and should not be used as a sole basis for treatment. Nasal washings and aspirates are unacceptable for Xpert Xpress SARS-CoV-2/FLU/RSV testing.  Fact Sheet for Patients: EntrepreneurPulse.com.au  Fact Sheet for Healthcare Providers: IncredibleEmployment.be  This test is not yet approved or cleared  by the [place] and has been authorized for detection and/or diagnosis of SARS-CoV-2 by FDA under an Emergency Use Authorization (EUA). This EUA will remain in effect (meaning this test can be used) for the duration of the COVID-19 declaration under Section 564(b)(1) of the Act, 21 U.S.C. section 360bbb-3(b)(1), unless the authorization is terminated or revoked.  Performed at [facility], [place] [address], [place], [place] [address]          Radiology Studies: ECHOCARDIOGRAM COMPLETE  Result Date: 06/26/2022    ECHOCARDIOGRAM REPORT   Patient Name:   MARIETA MARKOV [facility] Date of Exam: 06/26/2022 Medical Rec #:  [id number]      Height:       60.0 in  Accession #:    [id number]     Weight:       125.0 lb Date of Birth:  1956/06/23      BSA:          1.529 m Patient Age:    20 years       BP:           159/89 mmHg Patient Gender: F              HR:           93 bpm. Exam Location:  Inpatient Procedure: 2D Echo, Color Doppler and Cardiac Doppler Indications:    Stroke  History:        Patient has prior history of Echocardiogram examinations, most                 recent 09/30/2015. TIA and Stroke; Risk Factors:Diabetes,                 Dyslipidemia and Hypertension.  Sonographer:    Memory Argue Referring Phys: [id number] [place]  1. Left ventricular ejection fraction, by estimation, is 65 to 70%. The left ventricle has normal function. The left ventricle has no regional wall motion abnormalities. There is mild asymmetric left ventricular hypertrophy of the basal-septal segment. Indeterminate diastolic filling due to E-A fusion.  2. Right ventricular systolic function is normal. The right ventricular size is normal. Tricuspid regurgitation signal is inadequate for assessing PA pressure.  3. The mitral valve is grossly normal. Trivial mitral valve regurgitation. No evidence of mitral stenosis.  4. The aortic valve is grossly normal. Aortic valve regurgitation is not visualized. No aortic stenosis is present.  5. The inferior vena cava is normal in size with greater than 50% respiratory variability, suggesting right atrial pressure of 3 mmHg. Conclusion(s)/Recommendation(s): No intracardiac source of embolism detected on this transthoracic study. Consider a transesophageal echocardiogram to exclude cardiac source of embolism if clinically indicated. FINDINGS  Left Ventricle: Left ventricular ejection fraction, by estimation, is 65 to 70%. The left ventricle has normal function. The left ventricle has no regional wall motion abnormalities. The left ventricular internal cavity size was normal in size. There is  mild asymmetric left ventricular  hypertrophy of the basal-septal segment. Indeterminate diastolic filling due to E-A fusion. Right Ventricle: The right ventricular size is normal. No increase in right ventricular wall thickness. Right ventricular systolic function is normal. Tricuspid regurgitation signal is inadequate for assessing PA pressure. Left [facility]: Left atrial size was normal in size. Right [facility]: Right atrial size was normal in size. Pericardium: There is no evidence of pericardial effusion. Mitral Valve: The mitral valve is grossly normal. Trivial mitral valve regurgitation. No evidence of mitral valve stenosis. Tricuspid Valve: The  tricuspid valve is grossly normal. Tricuspid valve regurgitation is not demonstrated. No evidence of tricuspid stenosis. Aortic Valve: The aortic valve is grossly normal. Aortic valve regurgitation is not visualized. No aortic stenosis is present. Aortic valve mean gradient measures 3.0 mmHg. Aortic valve peak gradient measures 4.8 mmHg. Aortic valve area, by VTI measures 2.66 cm. Pulmonic Valve: The pulmonic valve was not well visualized. Pulmonic valve regurgitation is not visualized. Aorta: The aortic root is normal in size and structure. Venous: The inferior vena cava is normal in size with greater than 50% respiratory variability, suggesting right atrial pressure of 3 mmHg. IAS/Shunts: The atrial septum is grossly normal.  LEFT VENTRICLE PLAX 2D LVIDd:         4.30 cm   Diastology LVIDs:         2.90 cm   LV e' medial:    4.90 cm/s LV PW:         1.00 cm   LV E/e' medial:  20.8 LV IVS:        1.20 cm   LV e' lateral:   8.70 cm/s LVOT diam:     2.00 cm   LV E/e' lateral: 11.7 LV SV:         47 LV SV Index:   31 LVOT Area:     3.14 cm  RIGHT VENTRICLE RV S prime:     14.50 cm/s TAPSE (M-mode): 2.1 cm LEFT ATRIUM             Index        RIGHT ATRIUM           Index LA diam:        2.80 cm 1.83 cm/m   RA Area:     11.30 cm LA Vol (A2C):   37.8 ml 24.73 ml/m  RA Volume:   24.00 ml  15.70 ml/m LA Vol  (A4C):   28.6 ml 18.71 ml/m LA Biplane Vol: 35.1 ml 22.96 ml/m  AORTIC VALVE AV Area (Vmax):    2.60 cm AV Area (Vmean):   2.49 cm AV Area (VTI):     2.66 cm AV Vmax:           110.00 cm/s AV Vmean:          73.300 cm/s AV VTI:            0.176 m AV Peak Grad:      4.8 mmHg AV Mean Grad:      3.0 mmHg LVOT Vmax:         91.20 cm/s LVOT Vmean:        58.200 cm/s LVOT VTI:          0.149 m LVOT/AV VTI ratio: 0.85  AORTA Ao Root diam: 3.10 cm MITRAL VALVE MV Area (PHT): 5.88 cm     SHUNTS MV Decel Time: 129 msec     Systemic VTI:  0.15 m MV E velocity: 102.00 cm/s  Systemic Diam: 2.00 cm MV A velocity: 121.00 cm/s MV E/A ratio:  0.84 Eleonore Chiquito MD Electronically signed by Eleonore Chiquito MD Signature Date/Time: 06/26/2022/10:09:22 AM    Final    MR BRAIN WO CONTRAST  Result Date: 06/25/2022 CLINICAL DATA:  Left-sided weakness and vision loss EXAM: MRI HEAD WITHOUT CONTRAST TECHNIQUE: Multiplanar, multiecho pulse sequences of the brain and surrounding structures were obtained without intravenous contrast. COMPARISON:  10/02/2018 FINDINGS: Brain: No acute infarct, mass effect or extra-axial collection. 2-3 foci of chronic microhemorrhage in the left frontal  lobe and basal ganglia There is multifocal hyperintense T2-weighted signal within the white matter. Parenchymal volume and CSF spaces are normal. Dilated perivascular spaces of the lentiform nuclei. Midline structures are normal. Vascular: Major flow voids are preserved. Skull and upper cervical spine: Normal calvarium and skull base. Visualized upper cervical spine and soft tissues are normal. Sinuses/Orbits:No paranasal sinus fluid levels or advanced mucosal thickening. No mastoid or middle ear effusion. Normal orbits. IMPRESSION: 1. No acute intracranial abnormality. 2. Findings of chronic small vessel ischemia. Electronically Signed   By: Ulyses Jarred M.D.   On: 06/25/2022 21:39   CT Angio Head W or Wo Contrast  Result Date: 06/25/2022 CLINICAL  DATA:  L hemibody weakness, L eye vision loss, R neck pain EXAM: CT ANGIOGRAPHY HEAD AND NECK TECHNIQUE: Multidetector CT imaging of the head and neck was performed using the standard protocol during bolus administration of intravenous contrast. Multiplanar CT image reconstructions and MIPs were obtained to evaluate the vascular anatomy. Carotid stenosis measurements (when applicable) are obtained utilizing NASCET criteria, using the distal internal carotid diameter as the denominator. RADIATION DOSE REDUCTION: This exam was performed according to the departmental dose-optimization program which includes automated exposure control, adjustment of the mA and/or kV according to patient size and/or use of iterative reconstruction technique. CONTRAST:  56m OMNIPAQUE IOHEXOL 350 MG/ML SOLN COMPARISON:  CT head and MRI/MRA Feb 25, 2011. FINDINGS: CT HEAD FINDINGS Brain: Small age indeterminate infarct in the left thalamus. No evidence of acute hemorrhage, hydrocephalus, extra-axial collection or mass lesion/mass effect. Remote lacunar infarct versus dilated perivascular spaces in the inferior basal ganglia bilaterally. Additional patchy white matter hypodensities in the white matter, nonspecific but compatible with chronic microvascular ischemic disease. Vascular: See below. Skull: No acute fracture. Sinuses/Orbits: Right sphenoid sinus mucosal thickening. No acute orbital findings. Other: No mastoid effusions. Review of the MIP images confirms the above findings CTA NECK FINDINGS Aortic arch: Great vessel origins are patent without significant stenosis. Right carotid system: Atherosclerosis at the carotid bifurcation without greater than 50% stenosis. No evidence of dissection. Left carotid system: Atherosclerosis at the carotid bifurcation without greater than 50% stenosis. No evidence of dissection. Vertebral arteries: Codominant. No evidence of dissection, stenosis (50% or greater), or occlusion. Skeleton: Negative.  Other neck: No acute findings. Upper chest: Visualized lung apices are clear. Review of the MIP images confirms the above findings CTA HEAD FINDINGS Anterior circulation: Bilateral intracranial ICAs, MCAs, and ACAs are patent without proximal hemodynamically significant stenosis. Posterior circulation: Moderate right and mild left intradural vertebral artery stenosis due to atherosclerosis. Intradural vertebral arteries, basilar artery and bilateral posterior cerebral arteries are patent. Moderate to severe stenosis of the right P2 PCA. Venous sinuses: As permitted by contrast timing, patent. Review of MIP images confirms the above findings IMPRESSION: 1. Small age indeterminate infarct in the left thalamus. An MRI could further evaluate if clinically warranted. 2.  No emergent large vessel occlusion. 3. Moderate to severe stenosis of the right P2 PCA. 4. Moderate right and mild left intradural vertebral artery stenosis. Electronically Signed   By: FMargaretha SheffieldM.D.   On: 06/25/2022 18:22   CT Angio Neck W and/or Wo Contrast  Result Date: 06/25/2022 CLINICAL DATA:  L hemibody weakness, L eye vision loss, R neck pain EXAM: CT ANGIOGRAPHY HEAD AND NECK TECHNIQUE: Multidetector CT imaging of the head and neck was performed using the standard protocol during bolus administration of intravenous contrast. Multiplanar CT image reconstructions and MIPs were obtained to evaluate the vascular  anatomy. Carotid stenosis measurements (when applicable) are obtained utilizing NASCET criteria, using the distal internal carotid diameter as the denominator. RADIATION DOSE REDUCTION: This exam was performed according to the departmental dose-optimization program which includes automated exposure control, adjustment of the mA and/or kV according to patient size and/or use of iterative reconstruction technique. CONTRAST:  57m OMNIPAQUE IOHEXOL 350 MG/ML SOLN COMPARISON:  CT head and MRI/MRA Feb 25, 2011. FINDINGS: CT HEAD  FINDINGS Brain: Small age indeterminate infarct in the left thalamus. No evidence of acute hemorrhage, hydrocephalus, extra-axial collection or mass lesion/mass effect. Remote lacunar infarct versus dilated perivascular spaces in the inferior basal ganglia bilaterally. Additional patchy white matter hypodensities in the white matter, nonspecific but compatible with chronic microvascular ischemic disease. Vascular: See below. Skull: No acute fracture. Sinuses/Orbits: Right sphenoid sinus mucosal thickening. No acute orbital findings. Other: No mastoid effusions. Review of the MIP images confirms the above findings CTA NECK FINDINGS Aortic arch: Great vessel origins are patent without significant stenosis. Right carotid system: Atherosclerosis at the carotid bifurcation without greater than 50% stenosis. No evidence of dissection. Left carotid system: Atherosclerosis at the carotid bifurcation without greater than 50% stenosis. No evidence of dissection. Vertebral arteries: Codominant. No evidence of dissection, stenosis (50% or greater), or occlusion. Skeleton: Negative. Other neck: No acute findings. Upper chest: Visualized lung apices are clear. Review of the MIP images confirms the above findings CTA HEAD FINDINGS Anterior circulation: Bilateral intracranial ICAs, MCAs, and ACAs are patent without proximal hemodynamically significant stenosis. Posterior circulation: Moderate right and mild left intradural vertebral artery stenosis due to atherosclerosis. Intradural vertebral arteries, basilar artery and bilateral posterior cerebral arteries are patent. Moderate to severe stenosis of the right P2 PCA. Venous sinuses: As permitted by contrast timing, patent. Review of MIP images confirms the above findings IMPRESSION: 1. Small age indeterminate infarct in the left thalamus. An MRI could further evaluate if clinically warranted. 2.  No emergent large vessel occlusion. 3. Moderate to severe stenosis of the right P2  PCA. 4. Moderate right and mild left intradural vertebral artery stenosis. Electronically Signed   By: FMargaretha SheffieldM.D.   On: 06/25/2022 18:22        Scheduled Meds:  [START ON 06/27/2022]  stroke: early stages of recovery book   Does not apply Once   amoxicillin  500 mg Oral TID   aspirin  81 mg Oral Daily   Or   aspirin  300 mg Rectal Daily   atorvastatin  40 mg Oral Daily   budesonide (PULMICORT) nebulizer solution  0.25 mg Nebulization BID   busPIRone  10 mg Oral QHS   clopidogrel  75 mg Oral Daily   docusate sodium  100 mg Oral Daily   doxycycline  100 mg Oral BID   DULoxetine  30 mg Oral Daily   enoxaparin (LOVENOX) injection  40 mg Subcutaneous Q24H   pantoprazole  40 mg Oral Daily   primidone  250 mg Oral QHS   Continuous Infusions:   LOS: 0 days    Time spent: 45 minutes spent on chart review, discussion with nursing staff, consultants, updating family and interview/physical exam; more than 50% of that time was spent in counseling and/or coordination of care.    JGeradine Girt DO [facility] Available via Epic secure chat 7am-7pm After these hours, please refer to coverage provider listed on [email or web address] 06/26/2022, 11:16 AM

## 2022-06-26 NOTE — ED Notes (Signed)
[facility] contacted

## 2022-06-26 NOTE — TOC Initial Note (Signed)
Transition of Care [facility]) - Initial/Assessment Note    Patient Details  Name: Carrie [name] MRN: [id number] Date of Birth: 1956-02-13  Transition of Care [facility]) CM/SW Contact:    Dessa Phi, RN Phone Number: 06/26/2022, 11:49 AM  Clinical Narrative:  spoke to patient about d/c plans-agre to recc HHPT-declines HHOT-await [place] agency to accept.Has own transport home.                 Expected Discharge Plan: [place] Barriers to Discharge: Continued Medical Work up   Patient Goals and CMS Choice Patient states their goals for this hospitalization and ongoing recovery are::  (Home) CMS Medicare.gov Compare Post Acute Care list provided to:: Patient Choice offered to / list presented to : Patient  Expected Discharge Plan and Services Expected Discharge Plan: [place]   Discharge Planning Services: CM Consult Post Acute Care Choice: [place] arrangements for the past 2 months: Single Family Home                                      Prior Living Arrangements/Services Living arrangements for the past 2 months: Single Family Home Lives with:: Adult Children Patient language and need for interpreter reviewed:: Yes Do you feel safe going back to the place where you live?: Yes      Need for Family Participation in Patient Care: Yes (Comment) Care giver support system in place?: Yes (comment)   Criminal Activity/Legal Involvement Pertinent to Current Situation/Hospitalization: No - Comment as needed  Activities of Daily Living Home Assistive Devices/Equipment: None ADL Screening (condition at time of admission) Patient's cognitive ability adequate to safely complete daily activities?: No Is the patient deaf or have difficulty hearing?: No Does the patient have difficulty seeing, even when wearing glasses/contacts?: Yes Does the patient have difficulty concentrating, remembering, or making decisions?: No Patient able to  express need for assistance with ADLs?: No Does the patient have difficulty dressing or bathing?: No Independently performs ADLs?: Yes (appropriate for developmental age) Does the patient have difficulty walking or climbing stairs?: No Weakness of Legs: None Weakness of Arms/Hands: None  Permission Sought/Granted Permission sought to share information with : Case Manager Permission granted to share information with : Yes, Verbal Permission Granted  Share Information with NAME:  (Care Manager)           Emotional Assessment Appearance:: Appears stated age Attitude/Demeanor/Rapport: Gracious Affect (typically observed): Accepting Orientation: : Oriented to Self, Oriented to Place, Oriented to  Time, Oriented to Situation Alcohol / Substance Use: Not Applicable Psych Involvement: No (comment)  Admission diagnosis:  Facial droop [R29.810] Stroke ([place]) [I63.9] Sudden visual loss of left eye [H53.132] Stroke-like symptoms [R29.90] Weakness of left lower extremity [R29.898] Patient Active Problem List   Diagnosis Date Noted   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   Stroke ([place]) 06/25/2022   Fecal impaction ([place]) 04/14/2022   Proctitis 04/14/2022   Stage 3a chronic kidney disease (CKD) ([place]) 04/14/2022   Diabetic nephropathy ([place]) 04/06/2022   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   Hepatic cirrhosis ([place]) 04/06/2022   AKI superimposed on CKD IIIa 03/11/2022   Lightheadedness 03/11/2022   Stage 3 chronic kidney disease ([place]) 03/11/2022   Anxiety and depression 03/11/2022   Type 2 diabetes mellitus with complication, without long-term current use of insulin ([place]) 03/11/2022   Microcytic anemia 03/11/2022  Elevated d-dimer 03/11/2022   Medication monitoring encounter 11/07/2021   Psoriatic arthritis ([place]) 11/07/2021   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, sequela 10/03/2021   Pressure injury of skin 04/13/2021   Cirrhosis  of liver not due to alcohol ([place]) 03/16/2020   Bowel obstruction ([place]) 02/08/2020   Leukocytosis 02/08/2020   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   Psoriasis 07/08/2017   Mild neurocognitive disorder 10/18/2016   Diabetes ([place]) 02/16/2016   Burn from the sun 02/25/2015   Chest pain 12/04/2013   Diabetes mellitus type II, uncontrolled 12/04/2013   Benign essential HTN 12/04/2013   Hypoglycemia 12/04/2013   Acute pharyngitis 09/11/2013   Candidiasis of mouth 09/11/2013   HBP (high blood pressure) 04/21/2013   Gastroparesis 02/25/2013   Cough 02/25/2013   Nausea and vomiting 01/06/2013   Nausea alone 05/15/2011   STRICTURE AND STENOSIS OF ESOPHAGUS 10/21/2009   Hyperlipidemia 01/09/2008   OBESITY 01/09/2008   Asthma 01/09/2008   GERD with esophageal stricture and stenosis s/p dilation  01/09/2008   HERNIA, VENTRAL 01/09/2008   ARTHRITIS 01/09/2008   Depression 03/25/2007   ANXIETY 03/25/2007   PCP:  Bartholome Bill, MD Pharmacy:   [id number] [place], [place]Phone: [phone]: [phone] [id number] [place] [address] [place]one: [phone]: [phone] - [place]one: [phone]: [phone]   Social Determinants of Health (SDOH) Interventions    Readmission Risk Interventions     No data to display

## 2022-06-26 NOTE — TOC Progression Note (Signed)
Transition of Care [facility]) - Progression Note    Patient Details  Name: Carrie [name] MRN: [id number] Date of Birth: 06-May-1956  Transition of [facility]) CM/SW Contact  Teffany Blaszczyk, Juliann Pulse, RN Phone Number: 06/26/2022, 12:15 PM  Clinical Narrative:[facility] rep [place] accepted for HHPT.No further CM needs.       Expected Discharge Plan: [place] Barriers to Discharge: Continued Medical Work up  Expected Discharge Plan and Services Expected Discharge Plan: [place]   Discharge Planning Services: CM Consult Post Acute Care Choice: [place] arrangements for the past 2 months: Single Family Home                           HH Arranged: PT [place]: [facility] Date [place]: 06/26/22 Time [place]: 1215 Representative spoke with at [place]: jennifer   Social Determinants of Health ([place]) Interventions    Readmission Risk Interventions     No data to display

## 2022-06-26 NOTE — Assessment & Plan Note (Signed)
No hyperglycemia.  Hemoglobin A1c pending.

## 2022-06-26 NOTE — Evaluation (Signed)
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   Hepatic cirrhosis ([place]) 04/06/2022   AKI superimposed on CKD IIIa 03/11/2022   Lightheadedness 03/11/2022   Stage 3 chronic kidney disease ([place]) 03/11/2022   Anxiety and depression 03/11/2022   Type 2 diabetes mellitus with complication, without long-term current use of insulin ([place]) 03/11/2022   Microcytic anemia 03/11/2022   Elevated d-dimer 03/11/2022   Medication monitoring encounter 11/07/2021   Psoriatic arthritis ([place]) 11/07/2021   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, sequela 10/03/2021   Pressure injury of skin 04/13/2021   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[place]) 03/16/2020   Bowel obstruction ([place]) 02/08/2020   Leukocytosis 02/08/2020   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   Psoriasis 07/08/2017   Mild neurocognitive disorder 10/18/2016   Diabetes ([place]) 02/16/2016   Burn from the sun 02/25/2015   Chest pain 12/04/2013   Diabetes mellitus type II, uncontrolled 12/04/2013   Benign essential HTN 12/04/2013   Hypoglycemia 12/04/2013   Acute pharyngitis 09/11/2013   Candidiasis of mouth 09/11/2013   HBP (high blood pressure) 04/21/2013   Gastroparesis 02/25/2013   Cough 02/25/2013   Nausea and vomiting 01/06/2013   Nausea alone 05/15/2011   STRICTURE AND STENOSIS OF ESOPHAGUS 10/21/2009    Hyperlipidemia 01/09/2008   OBESITY 01/09/2008   Asthma 01/09/2008   GERD with esophageal stricture and stenosis s/p dilation  01/09/2008   HERNIA, VENTRAL 01/09/2008   ARTHRITIS 01/09/2008   Depression 03/25/2007   ANXIETY 03/25/2007   Past Medical History:  Past Medical History:  Diagnosis Date   Anxiety    Arthritis    psoriatic arithritis, DDD    Asthma    Depression    Diabetes mellitus    type 2   Gastritis    Gastroparesis    GERD (gastroesophageal reflux disease)    Hernia    Hyperlipemia    Hyperlipemia    Hypertension    Liver cirrhosis ([place])    Neuropathy    Bil feet re: to Diabetes   Obesity    Osteoporosis    Plantar fasciitis    Stricture and stenosis of esophagus    Stroke ([place])    TIA (transient ischemic attack)    8-10 yrs ago, no problems since   Tuberculosis    tested positive 2011, no symptoms, was on medicine for 6 months   Past Surgical History:  Past Surgical History:  Procedure Laterality Date   BREAST SURGERY Bilateral    biopsies both breast   CARDIAC CATHETERIZATION  08/30/2010   CESAREAN SECTION     x 2    CHOLECYSTECTOMY     COLONOSCOPY     FOOT SURGERY Left    spur removal   HERNIA REPAIR     IR FLUORO GUIDE CV LINE RIGHT  04/12/2021   IR US GUIDE VASC ACCESS RIGHT  04/12/2021   JOINT REPLACEMENT     bilat knee  KNEE SURGERY Bilateral    x 2 joint knee replacements   KYPHOPLASTY N/A 03/28/2022   Procedure: LUMBAR ONE KYPHOPLASTY;  Surgeon: Phylliss Bob, MD;  Location: [facility];  Service: Orthopedics;  Laterality: N/A;  LUMBAR ONE KYPHOPLASTY   TOTAL KNEE REVISION Left 10/03/2021   Procedure: LEFT TOTAL KNEE REVISION POLY SWAP WITH IRRIGATION AND DEBRIDEMENT;  Surgeon: Melrose Nakayama, MD;  Location: [facility];  Service: Orthopedics;  Laterality: Left;   UPPER GASTROINTESTINAL ENDOSCOPY     WISDOM TOOTH EXTRACTION     HPI:  66  yo female adm [facility] with left facial numbness and left eye vision loss.  MRI showed No acute  infarct, mass effect or extra-axial collection. 2-3 foci of chronic microhemorrhage in the left frontal lobe and basal ganglia There is multifocal hyperintense T2-weighted signal within   the white matter. Parenchymal volume and CSF spaces are normal.   Dilated perivascular spaces of the lentiform nuclei.  Pt's son and his girlfriend reside with her.  She manages all home duties.  PMH + for cirrhosis *(nonETOH), HTN, HLD, GERD, CVA.  Speech eval ordered.   Assessment / Plan / Recommendation Clinical Impression  SLUMS administered with pt scoring 29/30 - WFL.  Her speech and language are fluent - only deficit noted is left facial asymmetry, impaired left labial seal and impaired buccal sensation on left.  Plosive sounds are intelligible. Provided her with exercises to help with labial seal *resistance indicated*.  Reviewed symptoms of CVA and emergent need for medical services using teach back.  No SLP follow up needed.    SLP Assessment  SLP Recommendation/Assessment: Patient does not need any further Speech [place] Pathology Services SLP Visit Diagnosis: Dysarthria and anarthria (R47.1)    Recommendations for follow up therapy are one component of a multi-disciplinary discharge planning process, led by the attending physician.  Recommendations may be updated based on patient status, additional functional criteria and insurance authorization.    Follow Up Recommendations  No SLP follow up    Assistance Recommended at Discharge  None  Functional Status Assessment Patient has had a recent decline in their functional status and demonstrates the ability to make significant improvements in function in a reasonable and predictable amount of time.  Frequency and Duration           SLP Evaluation Cognition  Overall Cognitive Status: Within Functional Limits for tasks assessed Orientation Level: Oriented X4 Year: 2023 Month: August Day of Week: Correct Attention: Sustained Memory: Appears  intact Awareness: Appears intact Problem Solving: Appears intact Safety/Judgment: Appears intact       Comprehension  Auditory Comprehension Overall Auditory Comprehension: Appears within functional limits for tasks assessed Yes/No Questions: Not tested Commands: Within Functional Limits Conversation: Complex Visual Recognition/Discrimination Discrimination: Not tested Reading Comprehension Reading Status: Within funtional limits (with left eye covered)    Expression Expression Primary Mode of Expression: Verbal Verbal Expression Overall Verbal Expression: Appears within functional limits for tasks assessed Initiation: No impairment Repetition: No impairment Naming: No impairment Written Expression Dominant Hand: Right Written Expression: Not tested (pt reports poor handwriting)   Oral / Motor  Oral Motor/Sensory Function Overall Oral Motor/Sensory Function: Mild impairment Facial ROM: Reduced left;Suspected CN VII (facial) dysfunction Facial Symmetry: Abnormal symmetry left;Suspected CN VII (facial) dysfunction Facial Strength: Reduced left;Suspected CN VII (facial) dysfunction Facial Sensation: Reduced left;Suspected CN V (Trigeminal) dysfunction Lingual ROM: Within Functional Limits Lingual Symmetry: Within Functional Limits Lingual Strength: Within Functional Limits Lingual Sensation: Within Functional Limits Velum: Within  Functional Limits Mandible: Within Functional Limits Motor Speech Overall Motor Speech: Appears within functional limits for tasks assessed Respiration: Within functional limits Resonance: Within functional limits Articulation: Within functional limitis Intelligibility: Intelligible Motor Planning: Witnin functional limits Motor Speech Errors: Not applicable            Macario Golds 06/26/2022, 12:28 PM Kathleen Lime, MS [facility] SLP Acute Rehab Services Office [phone] Pager [phone]

## 2022-06-26 NOTE — H&P (Addendum)
History and Physical    Patient: Carrie [name] XFG:[id number] DOB: 19-Feb-1956 DOA: 06/25/2022 DOS: the patient was seen and examined on 06/26/2022 PCP: Bartholome Bill, MD  Patient coming from: Home  Chief Complaint:  Chief Complaint  Patient presents with   Eye Pain   HPI: Carrie [name] is a 66 y.o. female with medical history significant of hypertension, cirrhosis, type 2 diabetes with retinopathy, gastroparesis, psoriatic arthritis, CKD 3a, obesity, anxiety and depression who was sent to ED by her ophthalmologist for concerns of left-sided facial droop and retinal vein occlusion of the left eye.  About a week ago she noticed left facial numbness and then several days later her son noticed left facial droop but she did not seek any medical attention.  She then noticed increased to vision changes of the left eye and presented to her ophthalmologist today and was noted to have left retinal vein occlusion and recommended to present to the ED.  In the ED, she was afebrile, mildly hypertensive up to SBP of 170s on room air.No leukocytosis, hemoglobin of 9.7.  No significant electrolyte abnormalities.  Creatinine of 1.43 with BUN of 32.  Creatinine in the recent past has been around 1.2-1.3. Negative flu and COVID PCR  CTA head and neck shows small age-indeterminate infarct in the left thalamus.  No large vessel occlusion.  Moderate to severe stenosis of the right P2 PCA.  Moderate right and mild left intradural vertebral artery stenosis. MRI brain was negative.  ED PA has discussed with neurology Dr. Lorrin Goodell who has started her on aspirin and plavix. Suspect MRI negative stroke and unlikely for spinal cord lesions to cause facial droop.    Review of Systems: As mentioned in the history of present illness. All other systems reviewed and are negative. Past Medical History:  Diagnosis Date   Anxiety    Arthritis    psoriatic arithritis, DDD    Asthma    Depression    Diabetes  mellitus    type 2   Gastritis    Gastroparesis    GERD (gastroesophageal reflux disease)    Hernia    Hyperlipemia    Hyperlipemia    Hypertension    Liver cirrhosis ([place])    Neuropathy    Bil feet re: to Diabetes   Obesity    Osteoporosis    Plantar fasciitis    Stricture and stenosis of esophagus    Stroke ([place])    TIA (transient ischemic attack)    8-10 yrs ago, no problems since   Tuberculosis    tested positive 2011, no symptoms, was on medicine for 6 months   Past Surgical History:  Procedure Laterality Date   BREAST SURGERY Bilateral    biopsies both breast   CARDIAC CATHETERIZATION  08/30/2010   CESAREAN SECTION     x 2    CHOLECYSTECTOMY     COLONOSCOPY     FOOT SURGERY Left    spur removal   HERNIA REPAIR     IR FLUORO GUIDE CV LINE RIGHT  04/12/2021   IR US GUIDE VASC ACCESS RIGHT  04/12/2021   JOINT REPLACEMENT     bilat knee    KNEE SURGERY Bilateral    x 2 joint knee replacements   KYPHOPLASTY N/A 03/28/2022   Procedure: LUMBAR ONE KYPHOPLASTY;  Surgeon: Phylliss Bob, MD;  Location: [facility];  Service: Orthopedics;  Laterality: N/A;  LUMBAR ONE KYPHOPLASTY   TOTAL KNEE REVISION Left 10/03/2021   Procedure: LEFT  TOTAL KNEE REVISION POLY SWAP WITH IRRIGATION AND DEBRIDEMENT;  Surgeon: Melrose Nakayama, MD;  Location: [facility];  Service: Orthopedics;  Laterality: Left;   UPPER GASTROINTESTINAL ENDOSCOPY     WISDOM TOOTH EXTRACTION     Social History:  reports that she has never smoked. She has never used smokeless tobacco. She reports that she does not currently use alcohol. She reports that she does not use drugs.  Allergies  Allergen Reactions   Lisinopril Cough    Family History  Problem Relation Age of Onset   Diabetes Other        maternal aunts and uncles   Diabetes Father    Liver disease Father    Emphysema Father        smoked   Diabetes Sister    Diabetes Brother    Sarcoidosis Brother    Emphysema Mother        smoked   Asthma  Mother    Asthma Brother    Colon cancer Neg Hx    Esophageal cancer Neg Hx    Stomach cancer Neg Hx    Rectal cancer Neg Hx     Prior to Admission medications   Medication Sig Start Date End Date Taking? Authorizing Provider  albuterol (VENTOLIN HFA) 108 (90 Base) MCG/ACT inhaler Inhale 2 puffs into the lungs every 4 (four) hours as needed for wheezing or shortness of breath.   Yes [provider]  amoxicillin (AMOXIL) 500 MG capsule Take 1 capsule (500 mg total) by mouth 3 (three) times daily. 06/11/22  Yes Rosiland Oz, MD  aspirin EC 81 MG tablet Take 81 mg by mouth daily. Swallow whole.   Yes [provider]  bisacodyl (DULCOLAX) 10 MG suppository Place 1 suppository (10 mg total) rectally daily as needed for up to 7 doses for moderate constipation. 04/15/22  Yes Antonieta Pert, MD  busPIRone (BUSPAR) 5 MG tablet Take 10 mg by mouth at bedtime.   Yes [provider]  Calcium Carb-Cholecalciferol (CALCIUM CARBONATE-VITAMIN D3 PO) Take 1 tablet by mouth daily.   Yes [provider]  COSENTYX SENSOREADY, 300 MG, 150 MG/ML SOAJ Inject 300 mg into the skin every 30 (thirty) days. 02/27/22  Yes [provider]  docusate sodium (COLACE) 100 MG capsule Take 1 capsule (100 mg total) by mouth 2 (two) times daily. Patient taking differently: Take 100 mg by mouth daily. 04/15/22  Yes Antonieta Pert, MD  doxycycline (VIBRA-TABS) 100 MG tablet Take 1 tablet (100 mg total) by mouth 2 (two) times daily. Patient taking differently: Take 100 mg by mouth daily. 06/11/22  Yes Rosiland Oz, MD  DULoxetine (CYMBALTA) 30 MG capsule Take 30 mg by mouth daily.   Yes [provider]  esomeprazole (NEXIUM) 40 MG capsule Take 40 mg by mouth 2 (two) times daily before a meal. 01/18/20  Yes [provider]  FLOVENT HFA 110 MCG/ACT inhaler Inhale 2 puffs into the lungs in the morning and at bedtime. 03/17/21  Yes [provider]  metFORMIN (GLUCOPHAGE) 500  MG tablet Take 500 mg by mouth daily.   Yes [provider]  polyethylene glycol (MIRALAX / GLYCOLAX) 17 g packet Take 17 g by mouth 2 (two) times daily. Patient taking differently: Take 17 g by mouth daily as needed for moderate constipation. 04/15/22  Yes Antonieta Pert, MD  pravastatin (PRAVACHOL) 40 MG tablet Take 40 mg by mouth daily.   Yes [provider]  primidone (MYSOLINE) 250 MG tablet Take 250 mg  by mouth at bedtime.   Yes [provider]  Semaglutide, 1 MG/DOSE, (OZEMPIC, 1 MG/DOSE,) 2 MG/1.5ML SOPN Inject 1 mg into the skin once a week.   Yes [provider]  Accu-Chek Softclix Lancets lancets Use as instructed to check blood sugar 2 times daily Dx E11.9 03/23/21   Renato Shin, MD  Blood Glucose Monitoring Suppl (ACCU-CHEK GUIDE ME) w/Device KIT Use as instructed to check blood sugar 2 times daily Dx E11.9 03/23/21   Renato Shin, MD  glucose blood (ACCU-CHEK GUIDE) test strip USE AS INSTRUCTED TO CHECK BLOOD SUGAR 2 TIMES DAILY 12/25/21   Renato Shin, MD  metFORMIN (GLUCOPHAGE-XR) 500 MG 24 hr tablet Take 1 tablet (500 mg total) by mouth daily. Patient not taking: Reported on 06/25/2022 01/16/22   Renato Shin, MD  methocarbamol (ROBAXIN) 750 MG tablet Take 1 tablet (750 mg total) by mouth every 6 (six) hours as needed for muscle spasms. Patient not taking: Reported on 06/25/2022 03/28/22   Justice Britain, PA-C    Physical Exam: Vitals:   06/25/22 2030 06/25/22 2150 06/25/22 2200 06/25/22 2257  BP: (!) 174/90 (!) 172/93 (!) 168/89   Pulse: 86 81 81   Resp: (!) 22 (!) 26 (!) 26   Temp:    97.8 F (36.6 C)  TempSrc:    Oral  SpO2: 96% 100% 98%   Constitutional: NAD, calm, comfortable, elderly female laying in bed Eyes: PERRL, lids and conjunctivae normal ENMT: Mucous membranes are moist.  Neck: normal, supple Respiratory: clear to auscultation bilaterally, no wheezing, no crackles. Normal respiratory effort. No accessory muscle use.   Cardiovascular: Regular rate and rhythm, no murmurs / rubs / gallops. No extremity edema.   Abdomen: no tenderness,  Bowel sounds positive.  Musculoskeletal: no clubbing / cyanosis. No joint deformity upper and lower extremities. Good ROM, no contractures. Normal muscle tone.  Skin: no rashes, lesions, ulcers.  Neurologic: CN 2-12 grossly intact.  Obvious left facial droop with left-sided upper and lower extremity weakness with 4 out of 5 strength.  Decreased sensation of left temporal region.  psychiatric: Normal judgment and insight. Alert and oriented x 3. Normal mood. Data Reviewed:  See HPI  Assessment and Plan: * Stroke [facility]) - Presented with a week of left-sided facial droop and was sent by ophthalmology today for left retinal vein occlusion.  Will need follow-up with ophthalmology outpatient. -CTA head and neck shows small age-indeterminate infarct in the left thalamus.  No large vessel occlusion.  Moderate to severe stenosis of the right P2 PCA.  Moderate right and mild left intradural vertebral artery stenosis. - MRI brain was negative.  However, neurology suspects MRI negative for stroke.  Recommends starting aspirin and Plavix for 21 days and then monotherapy with aspirin. -Obtain echocardiogram  -Lipid panel showed LDL of 73- previously on pravastatin.  We will switch to high intensity statin. -Hemoglobin A1c pending -PT/OT/SLT -Frequent neuro checks and keep on telemetry -Allow for gradual normotension per Neuro -per neuro okay to remain at [facility] since most of her workup is complete  AKI superimposed on CKD IIIa Creatinine elevated 1.43 with a prior range around 1.2-1.3. -Give 500c bolus and follow repeat in the morning  Retinal vein occlusion of left eye Need follow up with ophthalmology outpatient.  No urgent intervention per EDP discussion with ophthalmology.  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  Followed  by ID.  Type 2 diabetes mellitus with complication, without long-term current use of insulin (HCC) No hyperglycemia.  Hemoglobin A1c pending.  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HCC) Compensated.  Followed by GI outpatient.  Anxiety and depression Continue BuSpar and Cymbalta  Psoriatic arthritis ([place]) On Cosentyx       Advance Care Planning:   Code Status: Full Code   Consults: Neurology  Family Communication: No family present  Severity of Illness: The appropriate patient status for this patient is OBSERVATION. Observation status is judged to be reasonable and necessary in order to provide the required intensity of service to ensure the patient's safety. The patient's presenting symptoms, physical exam findings, and initial radiographic and laboratory data in the context of their medical condition is felt to place them at decreased risk for further clinical deterioration. Furthermore, it is anticipated that the patient will be medically stable for discharge from the hospital within 2 midnights of admission.   Author: Orene Desanctis, DO 06/26/2022 1:36 AM  For on call review [email or web address].

## 2022-06-26 NOTE — Assessment & Plan Note (Addendum)
Creatinine elevated 1.43 with a prior range around 1.2-1.3. -Give 500c bolus and follow repeat in the morning

## 2022-06-26 NOTE — Evaluation (Signed)
Occupational Therapy Evaluation Patient Details Name: Carrie [name] MRN: [id number] DOB: 1956-08-06 Today's Date: 06/26/2022   History of Present Illness Pt is a 66 yo female admitted with c/o L facial dripp, LUE/LE decreased strength and coordiation and L eye blurriness.  MRI is -.  PMH: DMII with retinopathy, HTN, HLD, cirrhosis (non alcoholic), GERD, CVA, TIA, L TKA and kyphoplasty s/p fall in 03/2022.   Clinical Impression   Pt admitted with the above diagnosis and has the deficits outlined below. Pt would benefit from cont OT to increase strength in LUE and balance during adls so pt can d/c home with family. Pt will initially need 24/7 supervision but should only require PRN supervision within a few days.  Pt lives with family that is available. Pt would benefit from further OT in [facility] setting. Pt states she does not have someone that can take her to OPOT at this time and cannot drive on her own due to visual deficits.  Will cont to see with focus on HEP for LUE.      Recommendations for follow up therapy are one component of a multi-disciplinary discharge planning process, led by the attending physician.  Recommendations may be updated based on patient status, additional functional criteria and insurance authorization.   Follow Up Recommendations  Home health OT    Assistance Recommended at Discharge Intermittent Supervision/Assistance  Patient can return home with the following A little help with bathing/dressing/bathroom;Assistance with cooking/housework;Assist for transportation;Help with stairs or ramp for entrance    Functional Status Assessment  Patient has had a recent decline in their functional status and demonstrates the ability to make significant improvements in function in a reasonable and predictable amount of time.  Equipment Recommendations  None recommended by OT    Recommendations for Other Services       Precautions / Restrictions Precautions Precautions:  Fall Precaution Comments: Pt has had at least 2 falls in last 4 months one resulting in 4 fxs in spine and kyphoplasty Restrictions Weight Bearing Restrictions: No      Mobility Bed Mobility Overal bed mobility: Modified Independent             General bed mobility comments: HOB up    Transfers Overall transfer level: Needs assistance Equipment used: 1 person hand held assist Transfers: Bed to chair/wheelchair/BSC, Sit to/from Stand Sit to Stand: Supervision     Step pivot transfers: Min guard     General transfer comment: Min guard for safety      Balance Overall balance assessment: Needs assistance Sitting-balance support: Feet supported Sitting balance-Leahy Scale: Good     Standing balance support: No upper extremity supported Standing balance-Leahy Scale: Fair Standing balance comment: takes short shuffle steps to start but improved the further pt walked                           ADL either performed or assessed with clinical judgement   ADL Overall ADL's : Needs assistance/impaired Eating/Feeding: Set up;Sitting   Grooming: Wash/dry hands;Wash/dry face;Oral care;Supervision/safety;Standing   Upper Body Bathing: Set up;Sitting   Lower Body Bathing: Minimal assistance;Sit to/from stand;Cueing for compensatory techniques   Upper Body Dressing : Set up;Sitting   Lower Body Dressing: Minimal assistance;Sit to/from stand;Cueing for compensatory techniques   Toilet Transfer: Min guard;Ambulation;Comfort height toilet;Grab bars Toilet Transfer Details (indicate cue type and reason): Pt walked to bathroom without an assistive device. Toileting- Clothing Manipulation and Hygiene: Supervision/safety;Sit to/from stand  Functional mobility during ADLs: Min guard;Rolling walker (2 wheels) General ADL Comments: Pt overall close to baseline with basic adls. pt has not noticed weakness in L side but tests out with significant weakness in LUE.      Vision Baseline Vision/History: 5 Retinopathy Ability to See in Adequate Light: 1 Impaired Patient Visual Report: Blurring of vision Vision Assessment?: Yes Eye Alignment: Within Functional Limits Ocular Range of Motion: Within Functional Limits Alignment/Gaze Preference: Within Defined Limits Tracking/Visual Pursuits: Able to track stimulus in all quads without difficulty Convergence: Within functional limits Visual Fields: No apparent deficits Additional Comments: Pt with blurriness in L eye. Stated she is set to see a specialist. When R eye occluded, pt unable to count how many fingers are in front of her accurately.     Perception     Praxis      Pertinent Vitals/Pain Pain Assessment Pain Assessment: Faces Faces Pain Scale: No hurt     Hand Dominance Right   Extremity/Trunk Assessment Upper Extremity Assessment Upper Extremity Assessment: LUE deficits/detail LUE Deficits / Details: AROM: WFL  Strength:  shoulder 3+/5, biceps 3+/5, triceps 3+/5, grip 4/5 LUE Sensation: WNL LUE Coordination: decreased fine motor   Lower Extremity Assessment Lower Extremity Assessment: Defer to PT evaluation   Cervical / Trunk Assessment Cervical / Trunk Assessment: Back Surgery   Communication Communication Communication: No difficulties   Cognition Arousal/Alertness: Awake/alert Behavior During Therapy: WFL for tasks assessed/performed Overall Cognitive Status: Within Functional Limits for tasks assessed                                 General Comments: Pt states she has some memory problems but overall appears grossly intact     General Comments  Pt most limited by L side weakness    Exercises     Shoulder Instructions      Home Living Family/patient expects to be discharged to:: Private residence Living Arrangements: Children;Other relatives Available Help at Discharge: Family;Available PRN/intermittently Type of Home: House Home Access: Stairs to  enter [employer] of Steps: 3 Entrance Stairs-Rails: Can reach both Home Layout: One level     Bathroom Shower/Tub: Teacher, early years/pre: Handicapped height Bathroom Accessibility: Yes How Accessible: Accessible via walker Home Equipment: [place] (2 wheels);Cane - single point;BSC/3in1;Shower seat   Additional Comments: Pt has just got to the point where she stopped using walker after back surgery in June      Prior Functioning/Environment Prior Level of Function : Independent/Modified Independent             Mobility Comments: had been off walker for appx 2 weeks ADLs Comments: completed without assist.        OT Problem List: Decreased strength;Impaired balance (sitting and/or standing);Decreased coordination;Impaired UE functional use      OT Treatment/Interventions: Self-care/ADL training;Therapeutic exercise;DME and/or AE instruction;Therapeutic activities    OT Goals(Current goals can be found in the care plan section) Acute Rehab OT Goals Patient Stated Goal: to go home OT Goal Formulation: With patient Time For Goal Achievement: 07/10/22 Potential to Achieve Goals: Good ADL Goals Additional ADL Goal #1: Pt will walk to bathroom and complete all toileting tasks with mod I Additional ADL Goal #2: Pt will gather all clothes and dress self using walker if needed with mod I Additional ADL Goal #3: Pt will be independent with LUE HEP to increase strength and coordination with LUE to assist  with adls.  OT Frequency: Min 2X/week    Co-evaluation              AM-PAC OT "6 Clicks" Daily Activity     Outcome Measure Help from another person eating meals?: None Help from another person taking care of personal grooming?: None Help from another person toileting, which includes using toliet, bedpan, or urinal?: A Little Help from another person bathing (including washing, rinsing, drying)?: A Little Help from another person to put on  and taking off regular upper body clothing?: None Help from another person to put on and taking off regular lower body clothing?: A Little 6 Click Score: 21   End of Session Equipment Utilized During Treatment: Rolling walker (2 wheels) Nurse Communication: Mobility status  Activity Tolerance: Patient tolerated treatment well Patient left: in bed;with call bell/phone within reach;with bed alarm set  OT Visit Diagnosis: Unsteadiness on feet (R26.81)                Time: 3968-8648 OT Time Calculation (min): 19 min Charges:  OT General Charges $OT Visit: 1 Visit OT Evaluation $OT Eval Moderate Complexity: 1 Mod  Glenford Peers 06/26/2022, 10:55 AM

## 2022-06-26 NOTE — Progress Notes (Signed)
Mobility Specialist - Progress Note   06/26/22 1419  Mobility  HOB Elevated/Bed Position Self regulated  Activity Ambulated independently in hallway  Range of Motion/Exercises Active  Level of Assistance Independent  Assistive Device None  Distance Ambulated (ft) 150 ft  Activity Response Tolerated well  Transport method Ambulatory  $Mobility charge 1 Mobility   Pt received in bed and agreeable to mobility. Pt had to turn around due to coughing spell; ice water was given. Pt to bed after session with all needs met.    [facility]

## 2022-06-26 NOTE — Evaluation (Signed)
Physical Therapy Evaluation Patient Details Name: Carrie [name] MRN: [id number] DOB: 1956-06-19 Today's Date: 06/26/2022  History of Present Illness  Pt is a 66 yo female admitted with c/o L facial droop, LUE/LE decreased strength and coordiation and L eye blurriness.  MRI is - for CVA but neurologist still suspects there is a probability.  PMH: DMII with retinopathy, HTN, HLD, cirrhosis (non alcoholic), GERD, CVA, TIA, L TKA and kyphoplasty s/p fall in 03/2022.  Clinical Impression  On eval, pt was Min guard A for mobility. She walked ~65 feet x 2 (once without a device, once with a RW). Pt denied dizziness. She tolerated distance well. She reports she has access to a rollator at home that she can use if needed. PT recommendation for HHPT for general strength and balance training. Will plan to follow pt during this hospital stay.        Recommendations for follow up therapy are one component of a multi-disciplinary discharge planning process, led by the attending physician.  Recommendations may be updated based on patient status, additional functional criteria and insurance authorization.  Follow Up Recommendations Home health PT      Assistance Recommended at Discharge Intermittent Supervision/Assistance  Patient can return home with the following  Assist for transportation;Help with stairs or ramp for entrance;Assistance with cooking/housework    Equipment Recommendations None recommended by PT  Recommendations for Other Services  OT consult    Functional Status Assessment Patient has had a recent decline in their functional status and demonstrates the ability to make significant improvements in function in a reasonable and predictable amount of time.     Precautions / Restrictions Precautions Precautions: Fall Precaution Comments: Pt has had at least 2 falls in last 4 months one resulting in 4 fxs in spine and kyphoplasty Restrictions Weight Bearing Restrictions: No       Mobility  Bed Mobility Overal bed mobility: Modified Independent             General bed mobility comments: HOB up    Transfers Overall transfer level: Needs assistance Equipment used: Rolling walker (2 wheels) Transfers: Sit to/from Stand Sit to Stand: Min guard           General transfer comment: Min guard for safety. No assistance provided.    Ambulation/Gait Ambulation/Gait assistance: Min guard Gait Distance (Feet): 65 Feet (x2) Assistive device: Rolling walker (2 wheels), None Gait Pattern/deviations: Step-through pattern, Decreased stride length       General Gait Details: walked x 1 without device-decreased step and stride lengths, slower gait speed. walked x 1 with RW-improved step/stride length on L, slight improvement in gait speed.  Stairs            Wheelchair Mobility    Modified Rankin (Stroke Patients Only)       Balance Overall balance assessment: Mild deficits observed, not formally tested           Standing balance-Leahy Scale: Fair                               Pertinent Vitals/Pain Pain Assessment Pain Assessment: No/denies pain    Home Living Family/patient expects to be discharged to:: Private residence Living Arrangements: Children;Other relatives Available Help at Discharge: Family;Available PRN/intermittently Type of Home: House Home Access: Stairs to enter Entrance Stairs-Rails: Can reach both Entrance Stairs-Number of Steps: 3   Home Layout: One level Home Equipment: Conservation officer, nature (2 wheels);Cane -  single point;BSC/3in1;Shower seat Additional Comments: Pt has just got to the point where she stopped using walker after back surgery in June    Prior Function Prior Level of Function : Independent/Modified Independent             Mobility Comments: had been off walker for appx 2 weeks ADLs Comments: completed without assist.     Hand Dominance   Dominant Hand: Right    Extremity/Trunk  Assessment   Upper Extremity Assessment Upper Extremity Assessment: Defer to OT evaluation LUE Deficits / Details: AROM: WFL  Strength:  shoulder 3+/5, biceps 3+/5, triceps 3+/5, grip 4/5 LUE Sensation: WNL LUE Coordination: decreased fine motor    Lower Extremity Assessment Lower Extremity Assessment: Generalized weakness (Strength at least 4/5. No buckling noted while ambulating.)    Cervical / Trunk Assessment Cervical / Trunk Assessment: Back Surgery  Communication   Communication: No difficulties  Cognition Arousal/Alertness: Awake/alert Behavior During Therapy: WFL for tasks assessed/performed Overall Cognitive Status: Within Functional Limits for tasks assessed                                 General Comments: Pt states she has some memory problems but overall appears grossly intact        General Comments General comments (skin integrity, edema, etc.): Pt most limited by L side weakness    Exercises     Assessment/Plan    PT Assessment Patient needs continued PT services  PT Problem List Decreased mobility;Decreased balance       PT Treatment Interventions DME instruction;Gait training;Therapeutic activities;Therapeutic exercise;Patient/family education;Balance training;Functional mobility training    PT Goals (Current goals can be found in the Care Plan section)  Acute Rehab PT Goals Patient Stated Goal: regain independence/PLOF PT Goal Formulation: With patient Time For Goal Achievement: 07/10/22 Potential to Achieve Goals: Good    Frequency Min 3X/week     Co-evaluation               AM-PAC PT "6 Clicks" Mobility  Outcome Measure Help needed turning from your back to your side while in a flat bed without using bedrails?: None Help needed moving from lying on your back to sitting on the side of a flat bed without using bedrails?: None Help needed moving to and from a bed to a chair (including a wheelchair)?: None Help needed  standing up from a chair using your arms (e.g., wheelchair or bedside chair)?: None Help needed to walk in hospital room?: A Little Help needed climbing 3-5 steps with a railing? : A Little 6 Click Score: 22    End of Session Equipment Utilized During Treatment: Gait belt Activity Tolerance: Patient tolerated treatment well Patient left: with call bell/phone within reach   PT Visit Diagnosis: Difficulty in walking, not elsewhere classified (R26.2);Muscle weakness (generalized) (M62.81)    Time: 8889-1694 PT Time Calculation (min) (ACUTE ONLY): 11 min   Charges:   PT Evaluation $PT Eval Moderate Complexity: 1 Mod            Doreatha Massed, PT Acute Rehabilitation  Office: [phone] Pager: [phone]

## 2022-06-26 NOTE — Consult Note (Signed)
NEUROLOGY CONSULTATION NOTE   Date of service: June 26, 2022 Patient Name: Carrie [name] MRN:  [id number] DOB:  [birth year] Reason for consult: "Left facial droop and LUE and LLE weakness" Requesting Provider: Orene Desanctis, DO _ _ _   _ __   _ __ _ _  __ __   _ __   __ _  History of Present Illness  Carrie [name] is a 66 y.o. female with PMH significant for DM2 with retinopathy, HTN, HLD, non alcoholic cirrhosis, GERD, Osteoporosis, prior stroke and TIA who presents with L facial droop and LUE and LLE incoordination and L eye blurriness.  1 month ago, noted decreased visual acuity from left eye. Saw ophthalmologist and was found to have Central retinal vein occlusion. Also reports that on 06/16/22, her daughter in law noted L facial droop. Never really noted the LUE and LLE weakness until this was pointed out to her.  Reports Diabetes well controlled, HTN also well controlled. Takes cholesterol medication. Endorses prior hx of strokes. No family history of strokes.  Also reports that a couple weeks ago, had R sided numbness in RUE and R lower face that she felt was concerning for a stroke.  LKW: 06/16/22. mRS: 2 tNKASE: not offered, outside window Thrombectomy: not offered, low suspicion for LVO. NIHSS components Score: Comment  1a Level of Conscious 0[x]  1[]  2[]  3[]      1b LOC Questions 0[x]  1[]  2[]       1c LOC Commands 0[x]  1[]  2[]       2 Best Gaze 0[x]  1[]  2[]       3 Visual 0[x]  1[]  2[]  3[]      4 Facial Palsy 0[]  1[x]  2[]  3[]      5a Motor Arm - left 0[x]  1[]  2[]  3[]  4[]  UN[]    5b Motor Arm - Right 0[x]  1[]  2[]  3[]  4[]  UN[]    6a Motor Leg - Left 0[x]  1[]  2[]  3[]  4[]  UN[]    6b Motor Leg - Right 0[x]  1[]  2[]  3[]  4[]  UN[]    7 Limb Ataxia 0[x]  1[]  2[]  3[]  UN[]     8 Sensory 0[x]  1[]  2[]  UN[]      9 Best Language 0[x]  1[]  2[]  3[]      10 Dysarthria 0[x]  1[]  2[]  UN[]      11 Extinct. and Inattention 0[x]  1[]  2[]       TOTAL: 1      ROS   Constitutional Denies weight loss, fever and  chills.   HEENT + changes in vision with intact hearing.   Respiratory Denies SOB and cough.   CV Denies palpitations and CP   GI Denies abdominal pain, nausea, vomiting and diarrhea.   GU Denies dysuria and urinary frequency.   MSK Denies myalgia and joint pain.   Skin Denies rash and pruritus.   Neurological Denies headache and syncope.   Psychiatric Denies recent changes in mood. Denies anxiety and depression.    Past History   Past Medical History:  Diagnosis Date   Anxiety    Arthritis    psoriatic arithritis, DDD    Asthma    Depression    Diabetes mellitus    type 2   Gastritis    Gastroparesis    GERD (gastroesophageal reflux disease)    Hernia    Hyperlipemia    Hyperlipemia    Hypertension    Liver cirrhosis ([place])    Neuropathy    Bil feet re: to Diabetes   Obesity    Osteoporosis    Plantar fasciitis  Stricture and stenosis of esophagus    Stroke [facility])    TIA (transient ischemic attack)    8-10 yrs ago, no problems since   Tuberculosis    tested positive 2011, no symptoms, was on medicine for 6 months   Past Surgical History:  Procedure Laterality Date   BREAST SURGERY Bilateral    biopsies both breast   CARDIAC CATHETERIZATION  08/30/2010   CESAREAN SECTION     x 2    CHOLECYSTECTOMY     COLONOSCOPY     FOOT SURGERY Left    spur removal   HERNIA REPAIR     IR FLUORO GUIDE CV LINE RIGHT  04/12/2021   IR US GUIDE VASC ACCESS RIGHT  04/12/2021   JOINT REPLACEMENT     bilat knee    KNEE SURGERY Bilateral    x 2 joint knee replacements   KYPHOPLASTY N/A 03/28/2022   Procedure: LUMBAR ONE KYPHOPLASTY;  Surgeon: Phylliss Bob, MD;  Location: [facility];  Service: Orthopedics;  Laterality: N/A;  LUMBAR ONE KYPHOPLASTY   TOTAL KNEE REVISION Left 10/03/2021   Procedure: LEFT TOTAL KNEE REVISION POLY SWAP WITH IRRIGATION AND DEBRIDEMENT;  Surgeon: Melrose Nakayama, MD;  Location: [facility];  Service: Orthopedics;  Laterality: Left;   UPPER GASTROINTESTINAL  ENDOSCOPY     WISDOM TOOTH EXTRACTION     Family History  Problem Relation Age of Onset   Diabetes Other        maternal aunts and uncles   Diabetes Father    Liver disease Father    Emphysema Father        smoked   Diabetes Sister    Diabetes Brother    Sarcoidosis Brother    Emphysema Mother        smoked   Asthma Mother    Asthma Brother    Colon cancer Neg Hx    Esophageal cancer Neg Hx    Stomach cancer Neg Hx    Rectal cancer Neg Hx    Social History   Socioeconomic History   Marital status: Single    Spouse name: Not on file   Number of children: 2   Years of education: Not on file   Highest education level: Not on file  Occupational History   Occupation: Disabled   Tobacco Use   Smoking status: Never   Smokeless tobacco: Never  Vaping Use   Vaping Use: Never used  Substance and Sexual Activity   Alcohol use: Not Currently    Comment: socially   Drug use: No   Sexual activity: Not Currently    Birth control/protection: Post-menopausal  Other Topics Concern   Not on file  Social History Narrative   [place] daily    Social Determinants of Health   Financial Resource Strain: Not on file  Food Insecurity: Not on file  Transportation Needs: Not on file  Physical Activity: Not on file  Stress: Not on file  Social Connections: Not on file   Allergies  Allergen Reactions   Lisinopril Cough    Medications  (Not in a hospital admission)    Vitals   Vitals:   06/25/22 2257 06/26/22 0239 06/26/22 0300 06/26/22 0507  BP:   (!) 186/94 (!) 159/89  Pulse:   80 88  Resp:   (!) 24 19  Temp: 97.8 F (36.6 C) 97.7 F (36.5 C)    TempSrc: Oral Oral    SpO2:   100% 98%     There is no height  or weight on file to calculate BMI.  Physical Exam   General: Laying comfortably in bed; in no acute distress.  HENT: Normal oropharynx and mucosa. Normal external appearance of ears and nose.  Neck: Supple, no pain or tenderness  CV: No JVD. No peripheral  edema.  Pulmonary: Symmetric Chest rise. Normal respiratory effort.  Abdomen: Soft to touch, non-tender.  Ext: No cyanosis, edema, or deformity  Skin: No rash. Normal palpation of skin.   Musculoskeletal: Normal digits and nails by inspection. No clubbing.   Neurologic Examination  Mental status/Cognition: Alert, oriented to self, place, month and year, good attention. Speech/language: Fluent, comprehension intact, object naming intact, repetition intact.  Cranial nerves:   CN II Pupils equal and reactive to light, no VF deficits    CN III,IV,VI EOM intact, no gaze preference or deviation, no nystagmus    CN V normal sensation in V1, V2, and V3 segments bilaterally    CN VII Mild L facial droop which is more prominent with smiling.   CN VIII normal hearing to speech    CN IX & X normal palatal elevation, no uvular deviation    CN XI 5/5 head turn and 5/5 shoulder shrug bilaterally    CN XII midline tongue protrusion   Motor:  Muscle bulk: poor, tone normal, mild LUE pronator drift but no tremor Mvmt Root Nerve  Muscle Right Left Comments  SA C5/6 Ax Deltoid 5 5   EF C5/6 Mc Biceps 5 5   EE C6/7/8 Rad Triceps 5 4+   WF C6/7 Med FCR     WE C7/8 PIN ECU     F Ab C8/T1 U ADM/FDI 5 4+   HF L1/2/3 Fem Illopsoas 5 4+   KE L2/3/4 Fem Quad 5 5   DF L4/5 D Peron Tib Ant 5 5   PF S1/2 Tibial Grc/Sol 5 5    Sensation:  Light touch Intact throughout   Pin prick    Temperature    Vibration   Proprioception    Coordination/Complex Motor:  - Finger to Nose intact BL - Heel to shin intact BL - Rapid alternating movement are slowed in LUE and LLE - Gait: Deferred.  Labs   CBC:  Recent Labs  Lab 06/25/22 1629  WBC 7.5  HGB 9.7*  HCT 32.1*  MCV 75.7*  PLT 175    Basic Metabolic Panel:  Lab Results  Component Value Date   NA 141 06/25/2022   K 3.8 06/25/2022   CO2 23 06/25/2022   GLUCOSE 95 06/25/2022   BUN 32 (H) 06/25/2022   CREATININE 1.43 (H) 06/25/2022   CALCIUM  8.8 (L) 06/25/2022   GFRNONAA 41 (L) 06/25/2022   GFRAA >60 03/01/2020   Lipid Panel:  Lab Results  Component Value Date   LDLCALC 73 06/25/2022   HgbA1c:  Lab Results  Component Value Date   HGBA1C 6.0 (H) 06/25/2022   Urine Drug Screen:     Component Value Date/Time   LABOPIA NONE DETECTED 06/26/2022 0030   COCAINSCRNUR NONE DETECTED 06/26/2022 0030   LABBENZ NONE DETECTED 06/26/2022 0030   AMPHETMU NONE DETECTED 06/26/2022 0030   THCU NONE DETECTED 06/26/2022 0030   LABBARB POSITIVE (A) 06/26/2022 0030    Alcohol Level     Component Value Date/Time   ETH <10 06/25/2022 1629    CT Head without contrast(Personally reviewed): CTH was negative for a large hypodensity concerning for a large territory infarct or hyperdensity concerning for an ICH  CT  angio Head and Neck with contrast(Personally reviewed): Moderate to severe stenosis of the right P2 PCA.  MRI Brain(Personally reviewed): No acute stroke  Impression   Carrie [name] is a 67 y.o. female with PMH significant for DM2 with retinopathy, HTN, HLD, non alcoholic cirrhosis, GERD, Osteoporosis, prior stroke and TIA who presents with L facial droop and LUE and LLE incoordination. Despite negative MRI, symptoms are most concerning for a MRI negative small vessel stroke. I think CRVO is a separate problem and not related to her stroke.   She is a poor historian which muddies the picture more specially with negtive MRI brain but does endorse that her L face appears different from her baseline and that she does have a hard time getting her LUE and LLE to do fine movements and feels weaker on the left. Clinical exam is consistent with mild L sided weakness and incoordination and a left facial droop.  Primary Diagnosis:  Other cerebral infarction due to occlusion of stenosis of small artery.  Secondary Diagnosis: Essential (primary) hypertension and Type 2 diabetes mellitus w/o complications  Recommendations   -  Frequent Neuro checks per stroke unit protocol - Recommend obtaining TTE - Recommend obtaining Lipid panel with LDL - Please start statin if LDL > 70 - Recommend HbA1c - Antithrombotic - Aspirin 75m daily along with plavix 778mdaily x 21days, followed by Aspirin 8165maily alone. - Recommend DVT ppx - SBP goal - permissive hypertension first 24 h < 220/110. Held home meds.  - Recommend Telemetry monitoring for arrythmia - Recommend bedside swallow screen prior to PO intake. - Stroke education booklet - Recommend PT/OT/SLP consult   ______________________________________________________________________   Thank you for the opportunity to take part in the care of this patient. If you have any further questions, please contact the neurology consultation attending.  Signed,  [place] Number [phone]_ _   _ __   _ __ _ _  __ __   _ __   __ _

## 2022-06-26 NOTE — ED Notes (Signed)
Report provided to charge of receiving unit

## 2022-06-26 NOTE — Assessment & Plan Note (Signed)
Gradual normotension per neurology

## 2022-06-26 NOTE — Assessment & Plan Note (Signed)
Need follow up with ophthalmology outpatient.  No urgent intervention per EDP discussion with ophthalmology.

## 2022-06-26 NOTE — Assessment & Plan Note (Addendum)
-   Presented with a week of left-sided facial droop and was sent by ophthalmology today for left retinal vein occlusion.  Will need follow-up with ophthalmology outpatient. -CTA head and neck shows small age-indeterminate infarct in the left thalamus.  No large vessel occlusion.  Moderate to severe stenosis of the right P2 PCA.  Moderate right and mild left intradural vertebral artery stenosis. - MRI brain was negative.  However, neurology suspects MRI negative for stroke.  Recommends starting aspirin and Plavix for 21 days and then monotherapy with aspirin. -Obtain echocardiogram  -Lipid panel showed LDL of 73- previously on pravastatin.  We will switch to high intensity statin. -Hemoglobin A1c pending -PT/OT/SLT -Frequent neuro checks and keep on telemetry -Allow for gradual normotension per Neuro -per neuro okay to remain at [facility] since most of her workup is complete

## 2022-06-26 NOTE — Plan of Care (Signed)

## 2022-06-26 NOTE — Assessment & Plan Note (Signed)
On chronic doxycycline and amoxicillin.  Followed by ID.

## 2022-06-26 NOTE — Assessment & Plan Note (Signed)
Compensated.  Followed by GI outpatient.

## 2022-06-26 NOTE — Assessment & Plan Note (Signed)
On Cosentyx

## 2022-06-27 DIAGNOSIS — I6381 Other cerebral infarction due to occlusion or stenosis of small artery: Secondary | ICD-10-CM | POA: Diagnosis not present

## 2022-06-27 MED ORDER — CLOPIDOGREL BISULFATE 75 MG PO TABS: 75.0000 mg | ORAL_TABLET | Freq: Every day | ORAL | 1 refills | Status: DC

## 2022-06-27 MED ORDER — PANTOPRAZOLE SODIUM 40 MG PO TBEC: 40.0000 mg | DELAYED_RELEASE_TABLET | Freq: Every day | ORAL | 2 refills | Status: DC

## 2022-06-27 MED ORDER — ASPIRIN 81 MG PO CHEW: 81.0000 mg | CHEWABLE_TABLET | Freq: Every day | ORAL | 1 refills | Status: DC

## 2022-06-27 MED ORDER — PRAVASTATIN SODIUM 40 MG PO TABS: 40.0000 mg | ORAL_TABLET | Freq: Every day | ORAL | 0 refills | Status: DC

## 2022-06-27 NOTE — Care Management Obs Status (Signed)
MEDICARE OBSERVATION STATUS NOTIFICATION   Patient Details  Name: Carrie [name] MRN: [id number] Date of Birth: 01/02/1956   Medicare Observation Status Notification Given:  Yes    Dessa Phi, RN 06/27/2022, 10:51 AM

## 2022-06-27 NOTE — Progress Notes (Signed)
Brief Neuro Update:  Stroke workup completed with HbA1c within goal of less than 7. LDL mildly elevated at 73. TTE with EF of 65-70% with no PFO. Vessel imaging with no LVO, no significant stenosis. Overall suspect MRI negative small vessel stroke. The prior noted L thalamic infarct would not explain her left sided symptoms.  Recs: - Given LDL is close to [facility], will continue with Prvastatin which is a moderate intensity statin but increase the dose fo 4m daily. - continue DAPT with Aspirin 838mdaily along with plavix 7586maily x 21days, followed by Aspirin 15m30mily alone. - Aim for gradual normotension. - Follow up with stroke clinic outpatient in 2-3 months. - Neurology inpatient team will signoff. Please feel free to contact us w[place] any questions or concerns.  [place] Number [phone]

## 2022-06-27 NOTE — Progress Notes (Signed)
Pt discharged home today per Dr. Dwyane Dee. Pt's IV site D/C'd and WDL. Pt's VSS. Pt provided with home medication list, discharge instructions and prescriptions. Verbalized understanding. Pt left floor via WC in stable condition accompanied by NT.

## 2022-06-27 NOTE — Plan of Care (Signed)
  Problem: Education: Goal: Knowledge of General Education information will improve Description: Including pain rating scale, medication(s)/side effects and non-pharmacologic comfort measures Outcome: Completed/Met   Problem: Health Behavior/Discharge Planning: Goal: Ability to manage health-related needs will improve Outcome: Completed/Met   Problem: Clinical Measurements: Goal: Ability to maintain clinical measurements within normal limits will improve Outcome: Completed/Met Goal: Will remain free from infection Outcome: Completed/Met Goal: Diagnostic test results will improve Outcome: Completed/Met Goal: Respiratory complications will improve Outcome: Completed/Met Goal: Cardiovascular complication will be avoided Outcome: Completed/Met   Problem: Activity: Goal: Risk for activity intolerance will decrease Outcome: Completed/Met   Problem: Nutrition: Goal: Adequate nutrition will be maintained Outcome: Completed/Met   Problem: Coping: Goal: Level of anxiety will decrease Outcome: Completed/Met   Problem: Elimination: Goal: Will not experience complications related to bowel motility Outcome: Completed/Met Goal: Will not experience complications related to urinary retention Outcome: Completed/Met   Problem: Pain Managment: Goal: General experience of comfort will improve Outcome: Completed/Met   Problem: Safety: Goal: Ability to remain free from injury will improve Outcome: Completed/Met   Problem: Skin Integrity: Goal: Risk for impaired skin integrity will decrease Outcome: Completed/Met   Problem: Education: Goal: Knowledge of disease or condition will improve Outcome: Completed/Met Goal: Knowledge of secondary prevention will improve (SELECT ALL) Outcome: Completed/Met Goal: Knowledge of patient specific risk factors will improve (INDIVIDUALIZE FOR PATIENT) Outcome: Completed/Met   Problem: Coping: Goal: Will verbalize positive feelings about  self Outcome: Completed/Met Goal: Will identify appropriate support needs Outcome: Completed/Met   Problem: Health Behavior/Discharge Planning: Goal: Ability to manage health-related needs will improve Outcome: Completed/Met   Problem: Self-Care: Goal: Ability to participate in self-care as condition permits will improve Outcome: Completed/Met Goal: Verbalization of feelings and concerns over difficulty with self-care will improve Outcome: Completed/Met Goal: Ability to communicate needs accurately will improve Outcome: Completed/Met   Problem: Nutrition: Goal: Risk of aspiration will decrease Outcome: Completed/Met Goal: Dietary intake will improve Outcome: Completed/Met   Problem: Ischemic Stroke/TIA Tissue Perfusion: Goal: Complications of ischemic stroke/TIA will be minimized Outcome: Completed/Met

## 2022-06-27 NOTE — Progress Notes (Signed)
Mobility Specialist - Progress Note   06/27/22 1004  Mobility  HOB Elevated/Bed Position Self regulated  Activity Ambulated independently in hallway  Range of Motion/Exercises Active  Level of Assistance Independent  Assistive Device None  Distance Ambulated (ft) 500 ft  Activity Response Tolerated well  Transport method Ambulatory  $Mobility charge 1 Mobility   Pt received in bed and agreeable to mobility.  Pt to restroom after session with all needs met.     Carrie [name]  Mobility Specialist  

## 2022-06-27 NOTE — Discharge Instructions (Signed)
Advised to follow-up with primary care physician in 1 week. Advised to follow-up with neurology as scheduled. Advised to follow-up with ophthalmology to schedule.   Advised to take aspirin and Plavix for 3 weeks followed by aspirin only therapy. Advised to take pravastatin 80 mg daily for hyperlipidemia

## 2022-06-27 NOTE — Discharge Summary (Signed)
Physician Discharge Summary  Carrie [name]:[id number] DOB: 06-24-1956 DOA: 06/25/2022  PCP: Bartholome Bill, MD  Admit date: 06/25/2022  Discharge date: 06/27/2022  Admitted From: Home. Disposition:  Home Health Services.  Recommendations for Outpatient Follow-up:  Follow up with PCP in 1-2 weeks. Please obtain BMP/CBC in one week. Advised to follow-up with Neurology as scheduled. Advised to follow-up with ophthalmology to schedule.  Advised to take aspirin and Plavix for 3 weeks followed by aspirin only therapy. Advised to take pravastatin 80 mg daily for hyperlipidemia. Advised to continue amoxicillin and doxycycline as per ID recommendation.  Home Health:Home PT/OT Equipment/Devices: None  Discharge Condition: Stable CODE STATUS:Full code Diet recommendation: Heart Healthy   Brief Summary/ Hospital Course: Carrie [name] is a 66 y.o. female with medical history significant of hypertension, cirrhosis, type 2 diabetes with retinopathy, gastroparesis, psoriatic arthritis, CKD 3a, obesity, anxiety and depression who was sent to ED by her ophthalmologist for concerns of left-sided facial droop and retinal vein occlusion of the left eye.  Patient was admitted for stroke work-up.  Hemoglobin A1c within goal.  LDL mildly elevated at 73.  2D echocardiogram shows LVEF 65 to 70% with no PFO, vessel imaging without any LVO:  No significant stenosis.  Overall suspected MRI negative small vessel stroke. Neurology recommended pravastatin 80 mg daily, dual antiplatelet therapy aspirin and Plavix for 21 days followed by aspirin only therapy.  Follow-up in stroke clinic in 2 to 3 months.  Patient feels better and has shown improvement.  She wants to be discharged.  Patient started on blood pressure medication,  advised compliance.  Advised to follow-up with ophthalmology.  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   Will need follow-up with ophthalmology outpatient. -CTA head and neck shows small age-indeterminate infarct in the left thalamus.  No large vessel occlusion.  Moderate to severe stenosis of the right P2 PCA.  Moderate right and mild left intradural vertebral artery stenosis. - MRI brain was negative.  However, neurology suspects MRI negative small vessel stroke.  Recommends starting aspirin and Plavix for 21 days and then monotherapy with aspirin. -2D echo shows LVEF 65 to 70% no PFO -Lipid panel showed LDL of 73- previously on pravastatin.  Changed to pravastatin high intensity. -Hemoglobin A1c <7 -PT/OT/SLT- home health -Frequent neuro checks and keep on telemetry -Allow for gradual normotension per Neuro Neurology signed off , recommended outpatient neuro follow-up.   AKI superimposed on CKD IIIa Creatinine elevated 1.43 with a prior range around 1.2-1.3. Serum creatinine back to normal.   Retinal vein occlusion of left eye Need follow up with ophthalmology outpatient- [facility]   Benign essential HTN Gradual normotension per neurology.   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  Followed by ID.   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  Followed by GI  outpatient.   Anxiety and depression Continue BuSpar and Cymbalta   Psoriatic arthritis ([place]) On Cosentyx .    Discharge Instructions  Discharge Instructions     Call MD for:  persistant dizziness or light-headedness   Complete by: As directed    Call MD for:  persistant nausea and vomiting   Complete by: As directed    Diet - low sodium heart healthy   Complete by: As directed    Diet Carb Modified   Complete by: As directed    Increase activity slowly   Complete by: As directed       Allergies as of 06/27/2022       Reactions   Lisinopril Cough        Medication List     STOP taking these medications    amoxicillin 500 MG capsule Commonly known as: AMOXIL   aspirin EC 81 MG tablet Replaced by: aspirin 81 MG chewable tablet   doxycycline 100 MG tablet Commonly known as: VIBRA-TABS   esomeprazole 40 MG capsule Commonly known as: [place] Replaced by: pantoprazole 40 MG tablet   methocarbamol 750 MG tablet Commonly known as: ROBAXIN       TAKE these medications    Accu-Chek Guide Me w/Device Kit Use as instructed to check blood sugar 2 times daily Dx E11.9   Accu-Chek Guide test strip Generic drug: glucose blood USE AS INSTRUCTED TO CHECK BLOOD SUGAR 2 TIMES DAILY   Accu-Chek Softclix Lancets lancets Use as instructed to check blood sugar 2 times daily Dx E11.9   albuterol 108 (90 Base) MCG/ACT inhaler Commonly known as: VENTOLIN HFA Inhale 2 puffs into the lungs every 4 (four) hours as needed for wheezing or shortness of breath.   aspirin 81 MG chewable tablet Chew 1 tablet (81 mg total) by mouth daily. Start taking on: June 28, 2022 Replaces: aspirin EC 81 MG tablet   bisacodyl 10 MG suppository Commonly known as: DULCOLAX Place 1 suppository (10 mg total) rectally daily as needed for up to 7 doses for moderate constipation.   busPIRone 5 MG tablet Commonly known as: BUSPAR Take 10 mg by mouth at bedtime.   CALCIUM  CARBONATE-VITAMIN D3 PO Take 1 tablet by mouth daily.   clopidogrel 75 MG tablet Commonly known as: PLAVIX Take 1 tablet (75 mg total) by mouth daily. Start taking on: June 28, 2022   Cosentyx Sensoready (300 MG) 150 MG/ML [place] Generic drug: Secukinumab (300 MG Dose) Inject 300 mg into the skin every 30 (thirty) days.   docusate sodium 100 MG capsule Commonly known as: COLACE Take 1 capsule (100 mg total) by mouth 2 (two) times daily. What changed: when to take this   DULoxetine 30 MG capsule Commonly known as: CYMBALTA Take 30 mg by mouth daily.   Flovent HFA 110 MCG/ACT inhaler Generic drug: fluticasone Inhale 2 puffs into the lungs in the morning and at bedtime.   metFORMIN 500 MG tablet Commonly known as: GLUCOPHAGE Take 500 mg by mouth daily. What changed: Another medication with the same name was removed. Continue taking this medication, and follow the directions you see here.   Ozempic (1 MG/DOSE) 2 MG/1.5ML Sopn Generic drug: Semaglutide (1 MG/DOSE) Inject 1 mg into the skin once a week.   pantoprazole 40 MG tablet Commonly known as: PROTONIX Take 1 tablet (40 mg total) by mouth daily.  Start taking on: June 28, 2022 Replaces: esomeprazole 40 MG capsule   polyethylene glycol 17 g packet Commonly known as: MIRALAX / GLYCOLAX Take 17 g by mouth 2 (two) times daily. What changed:  when to take this reasons to take this   pravastatin 40 MG tablet Commonly known as: PRAVACHOL Take 1 tablet (40 mg total) by mouth daily.   primidone 250 MG tablet Commonly known as: MYSOLINE Take 250 mg by mouth at bedtime.        [place] Follow up.   Specialty: [place] Why: [facility] physical therapy Contact information: [place] [address] [phone]         Bartholome Bill, MD Follow up in 1 week(s).   Specialty: Family Medicine Contact information: [place] [address] [phone]                Allergies  Allergen Reactions   Lisinopril Cough    Consultations: Neurology   Procedures/Studies: ECHOCARDIOGRAM COMPLETE  Result Date: 06/26/2022    ECHOCARDIOGRAM REPORT   Patient Name:   TEKELIA KAREEM [facility] Date of Exam: 06/26/2022 Medical Rec #:  [id number]      Height:       60.0 in Accession #:    [id number]     Weight:       125.0 lb Date of Birth:  [birth year]      BSA:          1.529 m Patient Age:    36 years       BP:           159/89 mmHg Patient Gender: F              HR:           93 bpm. Exam Location:  Inpatient Procedure: 2D Echo, Color Doppler and Cardiac Doppler Indications:    Stroke  History:        Patient has prior history of Echocardiogram examinations, most                 recent 09/30/2015. TIA and Stroke; Risk Factors:Diabetes,                 Dyslipidemia and Hypertension.  Sonographer:    Memory Argue Referring Phys: [id number] [place]  1. Left ventricular ejection fraction, by estimation, is 65 to 70%. The left ventricle has normal function. The left ventricle has no regional wall motion abnormalities. There is mild asymmetric left ventricular hypertrophy of the basal-septal segment. Indeterminate diastolic filling due to E-A fusion.  2. Right ventricular systolic function is normal. The right ventricular size is normal. Tricuspid regurgitation signal is inadequate for assessing PA pressure.  3. The mitral valve is grossly normal. Trivial mitral valve regurgitation. No evidence of mitral stenosis.  4. The aortic valve is grossly normal. Aortic valve regurgitation is not visualized. No aortic stenosis is present.  5. The inferior vena cava is normal in size with greater than 50% respiratory variability, suggesting right atrial pressure of 3 mmHg. Conclusion(s)/Recommendation(s): No intracardiac source of embolism detected on this transthoracic study. Consider a  transesophageal echocardiogram to exclude cardiac source of embolism if clinically indicated. FINDINGS  Left Ventricle: Left ventricular ejection fraction, by estimation, is 65 to 70%. The left ventricle has normal function. The left ventricle has no regional wall motion abnormalities. The  left ventricular internal cavity size was normal in size. There is  mild asymmetric left ventricular hypertrophy of the basal-septal segment. Indeterminate diastolic filling due to E-A fusion. Right Ventricle: The right ventricular size is normal. No increase in right ventricular wall thickness. Right ventricular systolic function is normal. Tricuspid regurgitation signal is inadequate for assessing PA pressure. Left [facility]: Left atrial size was normal in size. Right [facility]: Right atrial size was normal in size. Pericardium: There is no evidence of pericardial effusion. Mitral Valve: The mitral valve is grossly normal. Trivial mitral valve regurgitation. No evidence of mitral valve stenosis. Tricuspid Valve: The tricuspid valve is grossly normal. Tricuspid valve regurgitation is not demonstrated. No evidence of tricuspid stenosis. Aortic Valve: The aortic valve is grossly normal. Aortic valve regurgitation is not visualized. No aortic stenosis is present. Aortic valve mean gradient measures 3.0 mmHg. Aortic valve peak gradient measures 4.8 mmHg. Aortic valve area, by VTI measures 2.66 cm. Pulmonic Valve: The pulmonic valve was not well visualized. Pulmonic valve regurgitation is not visualized. Aorta: The aortic root is normal in size and structure. Venous: The inferior vena cava is normal in size with greater than 50% respiratory variability, suggesting right atrial pressure of 3 mmHg. IAS/Shunts: The atrial septum is grossly normal.  LEFT VENTRICLE PLAX 2D LVIDd:         4.30 cm   Diastology LVIDs:         2.90 cm   LV e' medial:    4.90 cm/s LV PW:         1.00 cm   LV E/e' medial:  20.8 LV IVS:        1.20 cm   LV e' lateral:    8.70 cm/s LVOT diam:     2.00 cm   LV E/e' lateral: 11.7 LV SV:         47 LV SV Index:   31 LVOT Area:     3.14 cm  RIGHT VENTRICLE RV S prime:     14.50 cm/s TAPSE (M-mode): 2.1 cm LEFT ATRIUM             Index        RIGHT ATRIUM           Index LA diam:        2.80 cm 1.83 cm/m   RA Area:     11.30 cm LA Vol (A2C):   37.8 ml 24.73 ml/m  RA Volume:   24.00 ml  15.70 ml/m LA Vol (A4C):   28.6 ml 18.71 ml/m LA Biplane Vol: 35.1 ml 22.96 ml/m  AORTIC VALVE AV Area (Vmax):    2.60 cm AV Area (Vmean):   2.49 cm AV Area (VTI):     2.66 cm AV Vmax:           110.00 cm/s AV Vmean:          73.300 cm/s AV VTI:            0.176 m AV Peak Grad:      4.8 mmHg AV Mean Grad:      3.0 mmHg LVOT Vmax:         91.20 cm/s LVOT Vmean:        58.200 cm/s LVOT VTI:          0.149 m LVOT/AV VTI ratio: 0.85  AORTA Ao Root diam: 3.10 cm MITRAL VALVE MV Area (PHT): 5.88 cm     SHUNTS MV Decel Time: 129 msec  Systemic VTI:  0.15 m MV E velocity: 102.00 cm/s  Systemic Diam: 2.00 cm MV A velocity: 121.00 cm/s MV E/A ratio:  0.84 Eleonore Chiquito MD Electronically signed by Eleonore Chiquito MD Signature Date/Time: 06/26/2022/10:09:22 AM    Final    MR BRAIN WO CONTRAST  Result Date: 06/25/2022 CLINICAL DATA:  Left-sided weakness and vision loss EXAM: MRI HEAD WITHOUT CONTRAST TECHNIQUE: Multiplanar, multiecho pulse sequences of the brain and surrounding structures were obtained without intravenous contrast. COMPARISON:  10/02/2018 FINDINGS: Brain: No acute infarct, mass effect or extra-axial collection. 2-3 foci of chronic microhemorrhage in the left frontal lobe and basal ganglia There is multifocal hyperintense T2-weighted signal within the white matter. Parenchymal volume and CSF spaces are normal. Dilated perivascular spaces of the lentiform nuclei. Midline structures are normal. Vascular: Major flow voids are preserved. Skull and upper cervical spine: Normal calvarium and skull base. Visualized upper cervical spine and soft  tissues are normal. Sinuses/Orbits:No paranasal sinus fluid levels or advanced mucosal thickening. No mastoid or middle ear effusion. Normal orbits. IMPRESSION: 1. No acute intracranial abnormality. 2. Findings of chronic small vessel ischemia. Electronically Signed   By: Ulyses Jarred M.D.   On: 06/25/2022 21:39   CT Angio Head W or Wo Contrast  Result Date: 06/25/2022 CLINICAL DATA:  L hemibody weakness, L eye vision loss, R neck pain EXAM: CT ANGIOGRAPHY HEAD AND NECK TECHNIQUE: Multidetector CT imaging of the head and neck was performed using the standard protocol during bolus administration of intravenous contrast. Multiplanar CT image reconstructions and MIPs were obtained to evaluate the vascular anatomy. Carotid stenosis measurements (when applicable) are obtained utilizing NASCET criteria, using the distal internal carotid diameter as the denominator. RADIATION DOSE REDUCTION: This exam was performed according to the departmental dose-optimization program which includes automated exposure control, adjustment of the mA and/or kV according to patient size and/or use of iterative reconstruction technique. CONTRAST:  60m OMNIPAQUE IOHEXOL 350 MG/ML SOLN COMPARISON:  CT head and MRI/MRA Feb 25, 2011. FINDINGS: CT HEAD FINDINGS Brain: Small age indeterminate infarct in the left thalamus. No evidence of acute hemorrhage, hydrocephalus, extra-axial collection or mass lesion/mass effect. Remote lacunar infarct versus dilated perivascular spaces in the inferior basal ganglia bilaterally. Additional patchy white matter hypodensities in the white matter, nonspecific but compatible with chronic microvascular ischemic disease. Vascular: See below. Skull: No acute fracture. Sinuses/Orbits: Right sphenoid sinus mucosal thickening. No acute orbital findings. Other: No mastoid effusions. Review of the MIP images confirms the above findings CTA NECK FINDINGS Aortic arch: Great vessel origins are patent without  significant stenosis. Right carotid system: Atherosclerosis at the carotid bifurcation without greater than 50% stenosis. No evidence of dissection. Left carotid system: Atherosclerosis at the carotid bifurcation without greater than 50% stenosis. No evidence of dissection. Vertebral arteries: Codominant. No evidence of dissection, stenosis (50% or greater), or occlusion. Skeleton: Negative. Other neck: No acute findings. Upper chest: Visualized lung apices are clear. Review of the MIP images confirms the above findings CTA HEAD FINDINGS Anterior circulation: Bilateral intracranial ICAs, MCAs, and ACAs are patent without proximal hemodynamically significant stenosis. Posterior circulation: Moderate right and mild left intradural vertebral artery stenosis due to atherosclerosis. Intradural vertebral arteries, basilar artery and bilateral posterior cerebral arteries are patent. Moderate to severe stenosis of the right P2 PCA. Venous sinuses: As permitted by contrast timing, patent. Review of MIP images confirms the above findings IMPRESSION: 1. Small age indeterminate infarct in the left thalamus. An MRI could further evaluate if clinically warranted. 2.  No emergent large vessel occlusion. 3. Moderate to severe stenosis of the right P2 PCA. 4. Moderate right and mild left intradural vertebral artery stenosis. Electronically Signed   By: Margaretha Sheffield M.D.   On: 06/25/2022 18:22   CT Angio Neck W and/or Wo Contrast  Result Date: 06/25/2022 CLINICAL DATA:  L hemibody weakness, L eye vision loss, R neck pain EXAM: CT ANGIOGRAPHY HEAD AND NECK TECHNIQUE: Multidetector CT imaging of the head and neck was performed using the standard protocol during bolus administration of intravenous contrast. Multiplanar CT image reconstructions and MIPs were obtained to evaluate the vascular anatomy. Carotid stenosis measurements (when applicable) are obtained utilizing NASCET criteria, using the distal internal carotid diameter  as the denominator. RADIATION DOSE REDUCTION: This exam was performed according to the departmental dose-optimization program which includes automated exposure control, adjustment of the mA and/or kV according to patient size and/or use of iterative reconstruction technique. CONTRAST:  75m OMNIPAQUE IOHEXOL 350 MG/ML SOLN COMPARISON:  CT head and MRI/MRA Feb 25, 2011. FINDINGS: CT HEAD FINDINGS Brain: Small age indeterminate infarct in the left thalamus. No evidence of acute hemorrhage, hydrocephalus, extra-axial collection or mass lesion/mass effect. Remote lacunar infarct versus dilated perivascular spaces in the inferior basal ganglia bilaterally. Additional patchy white matter hypodensities in the white matter, nonspecific but compatible with chronic microvascular ischemic disease. Vascular: See below. Skull: No acute fracture. Sinuses/Orbits: Right sphenoid sinus mucosal thickening. No acute orbital findings. Other: No mastoid effusions. Review of the MIP images confirms the above findings CTA NECK FINDINGS Aortic arch: Great vessel origins are patent without significant stenosis. Right carotid system: Atherosclerosis at the carotid bifurcation without greater than 50% stenosis. No evidence of dissection. Left carotid system: Atherosclerosis at the carotid bifurcation without greater than 50% stenosis. No evidence of dissection. Vertebral arteries: Codominant. No evidence of dissection, stenosis (50% or greater), or occlusion. Skeleton: Negative. Other neck: No acute findings. Upper chest: Visualized lung apices are clear. Review of the MIP images confirms the above findings CTA HEAD FINDINGS Anterior circulation: Bilateral intracranial ICAs, MCAs, and ACAs are patent without proximal hemodynamically significant stenosis. Posterior circulation: Moderate right and mild left intradural vertebral artery stenosis due to atherosclerosis. Intradural vertebral arteries, basilar artery and bilateral posterior cerebral  arteries are patent. Moderate to severe stenosis of the right P2 PCA. Venous sinuses: As permitted by contrast timing, patent. Review of MIP images confirms the above findings IMPRESSION: 1. Small age indeterminate infarct in the left thalamus. An MRI could further evaluate if clinically warranted. 2.  No emergent large vessel occlusion. 3. Moderate to severe stenosis of the right P2 PCA. 4. Moderate right and mild left intradural vertebral artery stenosis. Electronically Signed   By: FMargaretha SheffieldM.D.   On: 06/25/2022 18:22      Subjective: Patient was seen and examined at bedside.  Overnight events noted.   Patient reports feeling much improved.  And she wants to be discharged.  Patient being discharged home.  Discharge Exam: Vitals:   06/27/22 0825 06/27/22 0902  BP:  (!) 142/76  Pulse:  88  Resp:  17  Temp:  98.2 F (36.8 C)  SpO2: 98% 98%   Vitals:   06/26/22 2029 06/27/22 0428 06/27/22 0825 06/27/22 0902  BP: (!) 165/91 (!) 150/79  (!) 142/76  Pulse: 87 83  88  Resp: 18 18  17   Temp: 97.6 F (36.4 C) 98.6 F (37 C)  98.2 F (36.8 C)  TempSrc:  Oral  Oral  SpO2:  100% 98% 98% 98%  Weight:      Height:        General: Pt is alert, awake, not in acute distress Cardiovascular: RRR, S1/S2 +, no rubs, no gallops Respiratory: CTA bilaterally, no wheezing, no rhonchi Abdominal: Soft, NT, ND, bowel sounds + Extremities: no edema, no cyanosis    The results of significant diagnostics from this hospitalization (including imaging, microbiology, ancillary and laboratory) are listed below for reference.     Microbiology: Recent Results (from the past 240 hour(s))  Resp Panel by RT-PCR (Flu A&B, Covid) Anterior Nasal Swab     Status: None   Collection Time: 06/25/22  4:30 PM   Specimen: Anterior Nasal Swab  Result Value Ref Range Status   SARS Coronavirus 2 by RT PCR NEGATIVE NEGATIVE Final    Comment: (NOTE) SARS-CoV-2 target nucleic acids are NOT DETECTED.  The  SARS-CoV-2 RNA is generally detectable in upper respiratory specimens during the acute phase of infection. The lowest concentration of SARS-CoV-2 viral copies this assay can detect is 138 copies/mL. A negative result does not preclude SARS-Cov-2 infection and should not be used as the sole basis for treatment or other patient management decisions. A negative result may occur with  improper specimen collection/handling, submission of specimen other than nasopharyngeal swab, presence of viral mutation(s) within the areas targeted by this assay, and inadequate number of viral copies(<138 copies/mL). A negative result must be combined with clinical observations, patient history, and epidemiological information. The expected result is Negative.  Fact Sheet for Patients:  EntrepreneurPulse.com.au  Fact Sheet for Healthcare Providers:  IncredibleEmployment.be  This test is no t yet approved or cleared by the [place] FDA and  has been authorized for detection and/or diagnosis of SARS-CoV-2 by FDA under an Emergency Use Authorization (EUA). This EUA will remain  in effect (meaning this test can be used) for the duration of the COVID-19 declaration under Section 564(b)(1) of the Act, 21 U.S.C.section 360bbb-3(b)(1), unless the authorization is terminated  or revoked sooner.       Influenza A by PCR NEGATIVE NEGATIVE Final   Influenza B by PCR NEGATIVE NEGATIVE Final    Comment: (NOTE) The Xpert Xpress SARS-CoV-2/FLU/RSV plus assay is intended as an aid in the diagnosis of influenza from Nasopharyngeal swab specimens and should not be used as a sole basis for treatment. Nasal washings and aspirates are unacceptable for Xpert Xpress SARS-CoV-2/FLU/RSV testing.  Fact Sheet for Patients: EntrepreneurPulse.com.au  Fact Sheet for Healthcare Providers: IncredibleEmployment.be  This test is not yet approved or  cleared by the [place] FDA and has been authorized for detection and/or diagnosis of SARS-CoV-2 by FDA under an Emergency Use Authorization (EUA). This EUA will remain in effect (meaning this test can be used) for the duration of the COVID-19 declaration under Section 564(b)(1) of the Act, 21 U.S.C. section 360bbb-3(b)(1), unless the authorization is terminated or revoked.  Performed at [facility], [place] [address] [place], [place] [address]      Labs: BNP (last 3 results) No results for input(s): "BNP" in the last 8760 hours. Basic Metabolic Panel: Recent Labs  Lab 06/25/22 1629  NA 141  K 3.8  CL 111  CO2 23  GLUCOSE 95  BUN 32*  CREATININE 1.43*  CALCIUM 8.8*   Liver Function Tests: No results for input(s): "AST", "ALT", "ALKPHOS", "BILITOT", "PROT", "ALBUMIN" in the last 168 hours. No results for input(s): "LIPASE", "AMYLASE" in the last 168 hours. No results for input(s): "AMMONIA"  in the last 168 hours. CBC: Recent Labs  Lab 06/25/22 1629  WBC 7.5  HGB 9.7*  HCT 32.1*  MCV 75.7*  PLT 282   Cardiac Enzymes: No results for input(s): "CKTOTAL", "CKMB", "CKMBINDEX", "TROPONINI" in the last 168 hours. BNP: Invalid input(s): "POCBNP" CBG: No results for input(s): "GLUCAP" in the last 168 hours. D-Dimer No results for input(s): "DDIMER" in the last 72 hours. Hgb A1c Recent Labs    06/25/22 1629  HGBA1C 6.0*   Lipid Profile Recent Labs    06/25/22 1629  CHOL 139  HDL 36*  LDLCALC 73  TRIG 150*  CHOLHDL 3.9   Thyroid function studies No results for input(s): "TSH", "T4TOTAL", "T3FREE", "THYROIDAB" in the last 72 hours.  Invalid input(s): "FREET3" Anemia work up No results for input(s): "VITAMINB12", "FOLATE", "FERRITIN", "TIBC", "IRON", "RETICCTPCT" in the last 72 hours. Urinalysis    Component Value Date/Time   COLORURINE YELLOW 04/14/2022 0105   APPEARANCEUR CLOUDY (A) 04/14/2022 0105   LABSPEC 1.024 04/14/2022  0105   PHURINE 8.0 04/14/2022 0105   GLUCOSEU 50 (A) 04/14/2022 0105   HGBUR MODERATE (A) 04/14/2022 0105   BILIRUBINUR NEGATIVE 04/14/2022 0105   KETONESUR 5 (A) 04/14/2022 0105   PROTEINUR 30 (A) 04/14/2022 0105   UROBILINOGEN 0.2 06/25/2015 1600   NITRITE NEGATIVE 04/14/2022 0105   LEUKOCYTESUR LARGE (A) 04/14/2022 0105   Sepsis Labs Recent Labs  Lab 06/25/22 1629  WBC 7.5   Microbiology Recent Results (from the past 240 hour(s))  Resp Panel by RT-PCR (Flu A&B, Covid) Anterior Nasal Swab     Status: None   Collection Time: 06/25/22  4:30 PM   Specimen: Anterior Nasal Swab  Result Value Ref Range Status   SARS Coronavirus 2 by RT PCR NEGATIVE NEGATIVE Final    Comment: (NOTE) SARS-CoV-2 target nucleic acids are NOT DETECTED.  The SARS-CoV-2 RNA is generally detectable in upper respiratory specimens during the acute phase of infection. The lowest concentration of SARS-CoV-2 viral copies this assay can detect is 138 copies/mL. A negative result does not preclude SARS-Cov-2 infection and should not be used as the sole basis for treatment or other patient management decisions. A negative result may occur with  improper specimen collection/handling, submission of specimen other than nasopharyngeal swab, presence of viral mutation(s) within the areas targeted by this assay, and inadequate number of viral copies(<138 copies/mL). A negative result must be combined with clinical observations, patient history, and epidemiological information. The expected result is Negative.  Fact Sheet for Patients:  EntrepreneurPulse.com.au  Fact Sheet for Healthcare Providers:  IncredibleEmployment.be  This test is no t yet approved or cleared by the [place] FDA and  has been authorized for detection and/or diagnosis of SARS-CoV-2 by FDA under an Emergency Use Authorization (EUA). This EUA will remain  in effect (meaning this test can be used) for  the duration of the COVID-19 declaration under Section 564(b)(1) of the Act, 21 U.S.C.section 360bbb-3(b)(1), unless the authorization is terminated  or revoked sooner.       Influenza A by PCR NEGATIVE NEGATIVE Final   Influenza B by PCR NEGATIVE NEGATIVE Final    Comment: (NOTE) The Xpert Xpress SARS-CoV-2/FLU/RSV plus assay is intended as an aid in the diagnosis of influenza from Nasopharyngeal swab specimens and should not be used as a sole basis for treatment. Nasal washings and aspirates are unacceptable for Xpert Xpress SARS-CoV-2/FLU/RSV testing.  Fact Sheet for Patients: EntrepreneurPulse.com.au  Fact Sheet for Healthcare Providers: IncredibleEmployment.be  This test is  not yet approved or cleared by the [place] and has been authorized for detection and/or diagnosis of SARS-CoV-2 by FDA under an Emergency Use Authorization (EUA). This EUA will remain in effect (meaning this test can be used) for the duration of the COVID-19 declaration under Section 564(b)(1) of the Act, 21 U.S.C. section 360bbb-3(b)(1), unless the authorization is terminated or revoked.  Performed at [facility], [place] [address] [place], [place] [address]      Time coordinating discharge: Over 30 minutes  SIGNED:   Shawna Clamp, MD  [facility] 06/27/2022, 10:59 AM Pager   If 7PM-7AM, please contact night-coverage

## 2022-07-13 ENCOUNTER — Other Ambulatory Visit: Payer: Self-pay | Admitting: Infectious Diseases

## 2022-07-26 ENCOUNTER — Ambulatory Visit
Admission: RE | Admit: 2022-07-26 | Discharge: 2022-07-26 | Disposition: A | Discharge status: Home / Self Care | Payer: Medicare Other | Source: Ambulatory Visit | Attending: Rheumatology | Admitting: Rheumatology

## 2022-07-26 DIAGNOSIS — M810 Age-related osteoporosis without current pathological fracture: Secondary | ICD-10-CM

## 2022-07-29 ENCOUNTER — Other Ambulatory Visit: Payer: Self-pay | Admitting: Infectious Diseases

## 2022-07-31 ENCOUNTER — Other Ambulatory Visit: Payer: Self-pay

## 2022-07-31 DIAGNOSIS — Z794 Long term (current) use of insulin: Secondary | ICD-10-CM

## 2022-07-31 MED ORDER — METFORMIN HCL 500 MG PO TABS: 500.0000 mg | ORAL_TABLET | Freq: Every day | ORAL | 2 refills | Status: DC

## 2022-08-10 ENCOUNTER — Other Ambulatory Visit: Payer: Self-pay

## 2022-08-10 ENCOUNTER — Encounter: Payer: Self-pay | Admitting: Infectious Diseases

## 2022-08-10 ENCOUNTER — Ambulatory Visit (INDEPENDENT_AMBULATORY_CARE_PROVIDER_SITE_OTHER): Payer: Medicare Other | Admitting: Infectious Diseases

## 2022-08-10 VITALS — BP 154/90 | HR 96 | Temp 96.1°F | Ht 60.0 in | Wt 133.0 lb

## 2022-08-10 DIAGNOSIS — Z5181 Encounter for therapeutic drug level monitoring: Secondary | ICD-10-CM

## 2022-08-10 DIAGNOSIS — Z96659 Presence of unspecified artificial knee joint: Secondary | ICD-10-CM

## 2022-08-10 DIAGNOSIS — T8454XD 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, subsequent encounter: Secondary | ICD-10-CM

## 2022-08-10 DIAGNOSIS — L4050 Arthropathic psoriasis, unspecified: Secondary | ICD-10-CM

## 2022-08-10 NOTE — Progress Notes (Addendum)
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   Hepatic cirrhosis ([place]) 04/06/2022   AKI superimposed on CKD IIIa 03/11/2022   Lightheadedness 03/11/2022   Stage 3 chronic kidney disease ([place]) 03/11/2022   Anxiety and depression 03/11/2022   Type 2 diabetes mellitus with complication, without long-term current use of insulin ([place]) 03/11/2022   Microcytic anemia 03/11/2022   Elevated d-dimer 03/11/2022   Medication monitoring encounter 11/07/2021   Psoriatic arthritis ([place]) 11/07/2021   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, sequela 10/03/2021   Pressure injury of skin 04/13/2021   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[place]) 03/16/2020   Bowel obstruction ([place]) 02/08/2020   Leukocytosis 02/08/2020   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   Psoriasis 07/08/2017   Mild neurocognitive disorder 10/18/2016   Diabetes ([place]) 02/16/2016   Burn from the sun 02/25/2015   Chest pain 12/04/2013   Diabetes mellitus type II, uncontrolled 12/04/2013   Benign essential HTN 12/04/2013   Hypoglycemia 12/04/2013   Acute pharyngitis 09/11/2013   Candidiasis of mouth 09/11/2013   HBP (high blood pressure) 04/21/2013   Gastroparesis 02/25/2013   Cough 02/25/2013   Nausea and vomiting 01/06/2013   Nausea alone 05/15/2011   STRICTURE AND STENOSIS OF ESOPHAGUS 10/21/2009   Hyperlipidemia 01/09/2008   OBESITY 01/09/2008   Asthma 01/09/2008   GERD with esophageal stricture and stenosis s/p dilation  01/09/2008   HERNIA, VENTRAL 01/09/2008   ARTHRITIS 01/09/2008   Depression  03/25/2007   ANXIETY 03/25/2007   Current Outpatient Medications on File Prior to Visit  Medication Sig Dispense Refill   Accu-Chek Softclix Lancets lancets Use as instructed to check blood sugar 2 times daily Dx E11.9 200 each 0   albuterol (VENTOLIN HFA) 108 (90 Base) MCG/ACT inhaler Inhale 2 puffs into the lungs every 4 (four) hours as needed for wheezing or shortness of breath.     aspirin 81 MG chewable tablet Chew 1 tablet (81 mg total) by mouth daily. 90 tablet 1   bisacodyl (DULCOLAX) 10 MG suppository Place 1 suppository (10 mg total) rectally daily as needed for up to 7 doses for moderate constipation. 7 suppository 0   Blood Glucose Monitoring Suppl (ACCU-CHEK GUIDE ME) w/Device KIT Use as instructed to check blood sugar 2 times daily Dx E11.9 1 kit 0   busPIRone (BUSPAR) 5 MG tablet Take 10 mg by mouth at bedtime.     Calcium Carb-Cholecalciferol (CALCIUM CARBONATE-VITAMIN D3 PO) Take 1 tablet by mouth daily.     clopidogrel (PLAVIX) 75 MG tablet Take 1 tablet (75 mg total) by mouth daily. 30 tablet 1   COSENTYX SENSOREADY, 300 MG, 150 MG/ML SOAJ Inject 300 mg into the skin every 30 (thirty) days.     docusate sodium (COLACE) 100 MG capsule Take 1 capsule (100 mg total) by mouth 2 (two) times daily. (Patient taking differently: Take 100 mg by mouth daily.) 10 capsule 0   DULoxetine (CYMBALTA) 30 MG capsule Take 30 mg by mouth daily.     FLOVENT HFA 110 MCG/ACT inhaler Inhale 2 puffs into the lungs in the morning  and at bedtime.     glucose blood (ACCU-CHEK GUIDE) test strip USE AS INSTRUCTED TO CHECK BLOOD SUGAR 2 TIMES DAILY 100 strip 5   metFORMIN (GLUCOPHAGE) 500 MG tablet Take 1 tablet (500 mg total) by mouth daily. 30 tablet 2   pantoprazole (PROTONIX) 40 MG tablet Take 1 tablet (40 mg total) by mouth daily. 30 tablet 2   polyethylene glycol (MIRALAX / GLYCOLAX) 17 g packet Take 17 g by mouth 2 (two) times daily. (Patient taking differently: Take 17 g by mouth daily as needed for  moderate constipation.) 14 each 0   pravastatin (PRAVACHOL) 40 MG tablet Take 1 tablet (40 mg total) by mouth daily. 30 tablet 0   primidone (MYSOLINE) 250 MG tablet Take 250 mg by mouth at bedtime.     Semaglutide, 1 MG/DOSE, (OZEMPIC, 1 MG/DOSE,) 2 MG/1.5ML SOPN Inject 1 mg into the skin once a week.     No current facility-administered medications on file prior to visit.   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 After last seen in 1/24, I had prescribed her Doxycycline and cefadroxil which she was unable to tolerate. She was feeling poorly with low energy and stomach upset after which I told her to stop taking the cefadroxil and continue taking the doxycycline only. She has been taking Doxycycline only since 2/10 and had no issues so far. She also needs to start cosentyx for her Psoriatic arthritis as he joint pain are getting worse. Discussed with her about the potential risks of infection with the medicine and the decision to start would be a based on her  risk vs benefit ratio.   01/30/22 She has been taking doxycycline and augmentin since last visit. Denies missing doses. But is struggling with nausea and stomach upset. She also had vomiting this morning. Denies abdominal cramps and diarrhea. She tells me she was started on ? New medicine by Rheumatology approx 2 months ago and is currently on monthly dosing. She has been following with GI for Liver cirrhosis and dysphagia. 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 She is also following with Pulmonary for asthma management and Podiatry for dystrophic nails.   04/06/22 Taking doxycycline and amoxicillin twice a day. She is taking medications as instructed but might have missed some pills while in the hospital when she was admitted 6/14 for l1 kyphoplasty for osteoporotic L1 compression # and 5/28-5/30 for dizziness and AKI and seen in the ED on 5/7 for fall. She is also following Rheumatology for psoriatic arthritis  and is on monthly cosentyx. She has been taken off of Ozempic from her PCP due to significant reduction in a1c and weight loss. Discussed need to take the po antibiotics until end of August to complete 6 months of tx for PJI.   06/06/22 Taking doxycyline and amoxicillin as instructed. Denies missing does or any side effects with the antibiotics. Left knee pain is variable from 4-/10. She recently had steroid injection in her left knee early August for pain by Dr Rhona Raider at [facility] Denies fevers, chills, sweats. Denies nausea, vomiting and diarrhea. No swelling, warmth and tenderness in left knee except pain. Discussed to get labs today and plan to continue or stop abtx thereafter. She is agreeable to the plan.   Recently admitted 7/1-7/2 for fecal impaction and stercoral proctitis.   08/10/22 Admitted on September for concerns for CVA and retinal vein occlusion of left eye. She had a  stroke work up done  and was discharged with Neurology fu. She feels back to her baseline and does not think she has any neurologic deficits. She istaking doxycyline and amoxicillin as instructed. Denies missing doses. Denis any concerns with the antibiotics like nausea, vomiting, abdominal pain and diarrhea. Denies fevers, chills and sweats. Tells she was seen by [facility] 10/6 where she had a steroid injection in her left shoulder. She denies any steroid injections in the left prosthetic knee recently and has not heard anything back regarding synovial fluid cultures from her left prosthetic knee that something has grown. Doing well otherwise with no complaints.   Review of Systems: ROS all systems reviewed and negative except as above  Past Medical History:  Diagnosis Date   Anxiety    Arthritis    psoriatic arithritis, DDD    Asthma    Depression    Diabetes mellitus    type 2   Gastritis    Gastroparesis    GERD (gastroesophageal reflux disease)    Hernia    Hyperlipemia     Hyperlipemia    Hypertension    Liver cirrhosis ([place])    Neuropathy    Bil feet re: to Diabetes   Obesity    Osteoporosis    Plantar fasciitis    Stricture and stenosis of esophagus    Stroke ([place])    TIA (transient ischemic attack)    8-10 yrs ago, no problems since   Tuberculosis    tested positive 2011, no symptoms, was on medicine for 6 months   Past Surgical History:  Procedure Laterality Date   BREAST SURGERY Bilateral    biopsies both breast   CARDIAC CATHETERIZATION  08/30/2010   CESAREAN SECTION     x 2    CHOLECYSTECTOMY     COLONOSCOPY     FOOT SURGERY Left    spur removal   HERNIA REPAIR     IR FLUORO GUIDE CV LINE RIGHT  04/12/2021   IR US GUIDE VASC ACCESS RIGHT  04/12/2021   JOINT REPLACEMENT     bilat knee    KNEE SURGERY Bilateral    x 2 joint knee replacements   KYPHOPLASTY N/A 03/28/2022   Procedure: LUMBAR ONE KYPHOPLASTY;  Surgeon: Phylliss Bob, MD;  Location: [place];  Service: Orthopedics;  Laterality: N/A;  LUMBAR ONE KYPHOPLASTY   TOTAL KNEE REVISION Left 10/03/2021   Procedure: LEFT TOTAL KNEE REVISION POLY SWAP WITH IRRIGATION AND DEBRIDEMENT;  Surgeon: Melrose Nakayama, MD;  Location: [facility];  Service: Orthopedics;  Laterality: Left;   UPPER GASTROINTESTINAL ENDOSCOPY     WISDOM TOOTH EXTRACTION      Social History   Tobacco Use   Smoking status: Never   Smokeless tobacco: Never  Vaping Use   Vaping Use: Never used  Substance Use Topics   Alcohol use: Not Currently    Comment: socially   Drug use: No    Family History  Problem Relation Age of Onset   Diabetes Other        maternal aunts and uncles   Diabetes Father    Liver disease Father    Emphysema Father        smoked   Diabetes Sister    Diabetes Brother    Sarcoidosis Brother    Emphysema Mother        smoked   Asthma Mother    Asthma Brother    Colon cancer Neg Hx    Esophageal cancer Neg Hx    Stomach cancer  Neg Hx    Rectal cancer Neg Hx     Allergies   Allergen Reactions   Lisinopril Cough    Health Maintenance  Topic Date Due   PAP SMEAR-Modifier  Never done   Zoster Vaccines- Shingrix (1 of 2) Never done   MAMMOGRAM  08/11/2012   OPHTHALMOLOGY EXAM  11/02/2017   COVID-19 Vaccine (4 - Pfizer series) 01/17/2021   Medicare Annual Wellness (AWV)  02/18/2021   Diabetic kidney evaluation - Urine ACR  01/19/2022   FOOT EXAM  03/16/2022   DEXA SCAN  Never done   Pneumonia Vaccine 72+ Years old (3 - PPSV23 or PCV20) 07/19/2022   HEMOGLOBIN A1C  12/24/2022   Diabetic kidney evaluation - GFR measurement  06/26/2023   COLONOSCOPY (Pts 45-68yr Insurance coverage will need to be confirmed)  11/28/2023   TETANUS/TDAP  02/19/2032   INFLUENZA VACCINE  Completed   Hepatitis C Screening  Completed   HIV Screening  Completed   HPV VACCINES  Aged Out    Objective: BP (!) 154/90   Pulse 96   Temp (!) 96.1 F (35.6 C) (Temporal)   Ht 5' (1.524 m)   Wt 133 lb (60.3 kg)   LMP  (LMP Unknown)   BMI 25.97 kg/m    Physical Exam Constitutional:      Appearance: Normal appearance.  HENT:     Head: Normocephalic and atraumatic.      Mouth: Mucous membranes are moist.  Eyes:    Conjunctiva/sclera: Conjunctivae normal.     Pupils: Pupils are equal, round  Cardiovascular:     Rate and Rhythm: Normal rate    Heart sounds:  Pulmonary:     Effort: Pulmonary effort is normal on room air     Breath sounds:  Abdominal:     General: Non distended     Palpations: soft.   Musculoskeletal:        General: Normal range of motion. Left knee with no warmth, swelling and tenderness. Good ROM  Skin:    General: Skin is warm and dry.     Comments:  Neurological:     General: grossly non focal . Power 5/5 in all extremities     Mental Status: awake, alert and oriented to person, place, and time.   Psychiatric:        Mood and Affect: Mood normal.   Lab Results Lab Results  Component Value Date   WBC 7.5 06/25/2022   HGB 9.7 (L)  06/25/2022   HCT 32.1 (L) 06/25/2022   MCV 75.7 (L) 06/25/2022   PLT 282 06/25/2022    Lab Results  Component Value Date   CREATININE 1.43 (H) 06/25/2022   BUN 32 (H) 06/25/2022   NA 141 06/25/2022   K 3.8 06/25/2022   CL 111 06/25/2022   CO2 23 06/25/2022    Lab Results  Component Value Date   ALT 11 04/15/2022   AST 19 04/15/2022   ALKPHOS 115 04/15/2022   BILITOT 0.6 04/15/2022    Lab Results  Component Value Date   CHOL 139 06/25/2022   HDL 36 (L) 06/25/2022   LDLCALC 73 06/25/2022   TRIG 150 (H) 06/25/2022   CHOLHDL 3.9 06/25/2022   No results found for: "LABRPR", "RPRTITER"  Microbiology Results for orders placed or performed during the hospital encounter of 06/25/22  Resp Panel by RT-PCR (Flu A&B, Covid) Anterior Nasal Swab     Status: None   Collection Time: 06/25/22  4:30 PM  Specimen: Anterior Nasal Swab  Result Value Ref Range Status   SARS Coronavirus 2 by RT PCR NEGATIVE NEGATIVE Final    Comment: (NOTE) SARS-CoV-2 target nucleic acids are NOT DETECTED.  The SARS-CoV-2 RNA is generally detectable in upper respiratory specimens during the acute phase of infection. The lowest concentration of SARS-CoV-2 viral copies this assay can detect is 138 copies/mL. A negative result does not preclude SARS-Cov-2 infection and should not be used as the sole basis for treatment or other patient management decisions. A negative result may occur with  improper specimen collection/handling, submission of specimen other than nasopharyngeal swab, presence of viral mutation(s) within the areas targeted by this assay, and inadequate number of viral copies(<138 copies/mL). A negative result must be combined with clinical observations, patient history, and epidemiological information. The expected result is Negative.  Fact Sheet for Patients:  EntrepreneurPulse.com.au  Fact Sheet for Healthcare Providers:   IncredibleEmployment.be  This test is no t yet approved or cleared by the [place] FDA and  has been authorized for detection and/or diagnosis of SARS-CoV-2 by FDA under an Emergency Use Authorization (EUA). This EUA will remain  in effect (meaning this test can be used) for the duration of the COVID-19 declaration under Section 564(b)(1) of the Act, 21 U.S.C.section 360bbb-3(b)(1), unless the authorization is terminated  or revoked sooner.       Influenza A by PCR NEGATIVE NEGATIVE Final   Influenza B by PCR NEGATIVE NEGATIVE Final    Comment: (NOTE) The Xpert Xpress SARS-CoV-2/FLU/RSV plus assay is intended as an aid in the diagnosis of influenza from Nasopharyngeal swab specimens and should not be used as a sole basis for treatment. Nasal washings and aspirates are unacceptable for Xpert Xpress SARS-CoV-2/FLU/RSV testing.  Fact Sheet for Patients: EntrepreneurPulse.com.au  Fact Sheet for Healthcare Providers: IncredibleEmployment.be  This test is not yet approved or cleared by the [place] FDA and has been authorized for detection and/or diagnosis of SARS-CoV-2 by FDA under an Emergency Use Authorization (EUA). This EUA will remain in effect (meaning this test can be used) for the duration of the COVID-19 declaration under Section 564(b)(1) of the Act, 21 U.S.C. section 360bbb-3(b)(1), unless the authorization is terminated or revoked.  Performed at [facility], [place] [address], [place], [place] [address]    Imaging DG MOBILE BONE DENSITY  Result Date: 07/27/2022 EXAM: DUAL X-RAY ABSORPTIOMETRY (DXA) FOR BONE MINERAL DENSITY 07/26/2022 1:12 pm CLINICAL DATA:  66 year old Female Postmenopausal. Osteoporosis, unspecified osteoporosis type, unspecified pathological fracture presence History of fragility fracture. History of vertebral fracture. Patient is or has been on glucocorticoid  therapy. TECHNIQUE: An axial (e.g., hips, spine) and/or appendicular (e.g., radius) exam was performed, as appropriate, using Clinical research associate at [employer]. Images are obtained for bone mineral density measurement and are not obtained for diagnostic purposes. [id number] Exclusions: Lumbar spine due to previous surgery and fracture. COMPARISON:  None. New baseline. FINDINGS: Scan quality: Good. LEFT FEMORAL NECK: BMD (in g/cm2): 0.585 T-score: -2.4 Z-score: -0.9 LEFT TOTAL HIP: BMD (in g/cm2): 0.712 T-score: -1.9 Z-score: -0.7 LEFT FOREARM (RADIUS 33%): BMD (in g/cm2): 0.609 T-score: -1.4 Z-score: 0.2 FRAX 10-YEAR PROBABILITY OF FRACTURE: Patient does not meet criteria for FRAX assessment. IMPRESSION: Osteopenia based on BMD. Fracture risk is increased. Increased risk is based on low BMD, and history of vertebral fracture. RECOMMENDATIONS: 1. All patients should optimize calcium and vitamin D intake. 2. Consider FDA-approved medical therapies in postmenopausal women and men aged 68 years  and older, based on the following: - A hip or vertebral (clinical or morphometric) fracture - T-score less than or equal to -2.5 and secondary causes have been excluded. - Low bone mass (T-score between -1.0 and -2.5) and a 10-year probability of a hip fracture greater than or equal to 3% or a 10-year probability of a major osteoporosis-related fracture greater than or equal to 20% based on the [place]-adapted WHO algorithm. - Clinician judgment and/or patient preferences may indicate treatment for people with 10-year fracture probabilities above or below these levels 3. Patients with diagnosis of osteoporosis or at high risk for fracture should have regular bone mineral density tests. For patients eligible for Medicare, routine testing is allowed once every 2 years. The testing frequency can be increased to one year for patients who have rapidly progressing disease, those who are receiving or discontinuing  medical therapy to restore bone mass, or have additional risk factors. Electronically Signed   By: Lillia Mountain M.D.   On: 07/27/2022 08:39     Assessment/Plan # Left Knee PJI  Continue doxycyline and amoxicillin. Will consider stopping abtx around end of Jan 2023 as by that time would have complete around  year of antibiotic suppression. Says has adequate pills and knows to call office if refills needed.  Will re-request  records from [facility] - last office note, synovial fluid analysis +/- cultures of left prosthetic knee  Fu 3 months.   # Medication Monitoring Labs today including ESR and CRP ( elevated prior values )  # Psoriatic arthritis Follows Rheumatology, on monthly cosentyx  I have personally spent 35 minutes involved in face-to-face and non-face-to-face activities for this patient on the day of the visit including staff time, coordination of care and counseling of the patient.   Wilber Oliphant, [facility] 08/10/2022, 11:41 AM

## 2022-08-11 LAB — COMPREHENSIVE METABOLIC PANEL
AG Ratio: 1 (calc) (ref 1.0–2.5)
ALT: 6 U/L (ref 6–29)
AST: 14 U/L (ref 10–35)
Albumin: 3.9 g/dL (ref 3.6–5.1)
Alkaline phosphatase (APISO): 101 U/L (ref 37–153)
BUN/Creatinine Ratio: 18 (calc) (ref 6–22)
BUN: 23 mg/dL (ref 7–25)
CO2: 24 mmol/L (ref 20–32)
Calcium: 9 mg/dL (ref 8.6–10.4)
Chloride: 107 mmol/L (ref 98–110)
Creat: 1.29 mg/dL — ABNORMAL HIGH (ref 0.50–1.05)
Globulin: 3.8 g/dL (calc) — ABNORMAL HIGH (ref 1.9–3.7)
Glucose, Bld: 106 mg/dL — ABNORMAL HIGH (ref 65–99)
Potassium: 4.1 mmol/L (ref 3.5–5.3)
Sodium: 140 mmol/L (ref 135–146)
Total Bilirubin: 0.2 mg/dL (ref 0.2–1.2)
Total Protein: 7.7 g/dL (ref 6.1–8.1)

## 2022-08-11 LAB — CBC
HCT: 31.2 % — ABNORMAL LOW (ref 35.0–45.0)
Hemoglobin: 9.8 g/dL — ABNORMAL LOW (ref 11.7–15.5)
MCH: 22.5 pg — ABNORMAL LOW (ref 27.0–33.0)
MCHC: 31.4 g/dL — ABNORMAL LOW (ref 32.0–36.0)
MCV: 71.6 fL — ABNORMAL LOW (ref 80.0–100.0)
MPV: 11.1 fL (ref 7.5–12.5)
Platelets: 264 10*3/uL (ref 140–400)
RBC: 4.36 10*6/uL (ref 3.80–5.10)
RDW: 17 % — ABNORMAL HIGH (ref 11.0–15.0)
WBC: 6.9 10*3/uL (ref 3.8–10.8)

## 2022-08-11 LAB — C-REACTIVE PROTEIN: CRP: 8.8 mg/L — ABNORMAL HIGH (ref <8.0)

## 2022-08-11 LAB — SEDIMENTATION RATE: Sed Rate: 39 mm/h — ABNORMAL HIGH (ref 0–30)

## 2022-08-16 ENCOUNTER — Ambulatory Visit (INDEPENDENT_AMBULATORY_CARE_PROVIDER_SITE_OTHER): Payer: Medicare Other | Admitting: Internal Medicine

## 2022-08-16 ENCOUNTER — Encounter: Payer: Self-pay | Admitting: Internal Medicine

## 2022-08-16 VITALS — BP 138/92 | HR 89 | Ht 60.0 in | Wt 134.8 lb

## 2022-08-16 DIAGNOSIS — E785 Hyperlipidemia, unspecified: Secondary | ICD-10-CM | POA: Diagnosis not present

## 2022-08-16 DIAGNOSIS — E1142 Type 2 diabetes mellitus with diabetic polyneuropathy: Secondary | ICD-10-CM

## 2022-08-16 DIAGNOSIS — Z794 Long term (current) use of insulin: Secondary | ICD-10-CM | POA: Diagnosis not present

## 2022-08-16 MED ORDER — SEMAGLUTIDE (1 MG/DOSE) 4 MG/3ML ~~LOC~~ SOPN: 1.0000 mg | PEN_INJECTOR | SUBCUTANEOUS | 3 refills | Status: DC

## 2022-08-16 MED ORDER — METFORMIN HCL 500 MG PO TABS: 500.0000 mg | ORAL_TABLET | Freq: Every day | ORAL | 3 refills | Status: DC

## 2022-08-16 NOTE — Progress Notes (Signed)
Patient ID: Carrie [name], female   DOB: [birth year], 66 y.o.   MRN: [id number]  HPI: Carrie [name] is a 66 y.o.-year-old female, returning for follow-up for DM2, dx in 2010, non-insulin-dependent, controlled, with complications (TIA, DR, CKD, peripheral neuropathy, h/o gastroparesis). Pt. previously saw Dr. Loanne Drilling, last visit 7 months ago.  Reviewed HbA1c: Lab Results  Component Value Date   HGBA1C 6.0 (H) 06/25/2022   HGBA1C 5.8 (A) 01/16/2022   HGBA1C 6.1 (A) 09/21/2021   HGBA1C 6.0 (A) 03/16/2021   HGBA1C 6.2 (A) 09/20/2020   HGBA1C 5.6 05/05/2020   HGBA1C 6.0 (H) 02/09/2020   HGBA1C 5.3 01/18/2020   HGBA1C 5.5 11/19/2019   HGBA1C 5.7 (A) 09/17/2019   Pt is on a regimen of: - Metformin 500 mg daily - Ozempic 1 mg weekly >> No N/V/AP/D/C She had vaginitis from [facility]. She was on multiple insulin injections 2011-2021 but had problems forgetting them - per Dr. Cordelia Pen note.  Pt checks her sugars 2x a day and they are: - am: 90s-120s - 2h after b'fast: n/c - before lunch: n/c - 2h after lunch: n/c - before dinner: n/c - 2h after dinner: 140-170s - bedtime: n/c - nighttime: n/c Lowest sugar was 90s; she has hypoglycemia awareness at 60s.  Highest sugar was 190s rarely - 6 mo ago.  Glucometer: Accu-Chek  - + CKD, last BUN/creatinine:  Lab Results  Component Value Date   BUN 23 08/10/2022   BUN 32 (H) 06/25/2022   CREATININE 1.29 (H) 08/10/2022   CREATININE 1.43 (H) 06/25/2022  On Cozaar 100 mg daily.  -+ HL; last set of lipids: Lab Results  Component Value Date   CHOL 139 06/25/2022   HDL 36 (L) 06/25/2022   LDLCALC 73 06/25/2022   TRIG 150 (H) 06/25/2022   CHOLHDL 3.9 06/25/2022  On pravastatin 40 mg daily.  - last eye exam was in 2023. 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 She goes to retinal specialist - now in [place] injections. May not need further inj's.  - + numbness and tingling in her feet. She sees neurology >> on Gabapentin - still symptomatic >> tapered it off >> now  on Cymbalta >> helps.  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  ROS: + see HPI No increased urination, blurry vision, nausea, chest pain.  Past Medical History:  Diagnosis Date   Anxiety    Arthritis    psoriatic arithritis, DDD    Asthma    Depression    Diabetes mellitus    type 2   Gastritis    Gastroparesis    GERD (gastroesophageal reflux disease)    Hernia    Hyperlipemia    Hyperlipemia    Hypertension    Liver cirrhosis ([place])    Neuropathy    Bil feet re: to Diabetes   Obesity    Osteoporosis    Plantar fasciitis    Stricture and stenosis of esophagus    Stroke ([place])    TIA (transient ischemic attack)    8-10 yrs ago, no problems since   Tuberculosis    tested positive 2011, no symptoms, was on medicine for 6 months   Past Surgical History:  Procedure Laterality Date   BREAST SURGERY Bilateral    biopsies both breast   CARDIAC CATHETERIZATION  08/30/2010   CESAREAN SECTION     x 2    CHOLECYSTECTOMY  COLONOSCOPY     FOOT SURGERY Left    spur removal   HERNIA REPAIR     IR FLUORO GUIDE CV LINE RIGHT  04/12/2021   IR US GUIDE VASC ACCESS RIGHT  04/12/2021   JOINT REPLACEMENT     bilat knee    KNEE SURGERY Bilateral    x 2 joint knee replacements   KYPHOPLASTY N/A 03/28/2022   Procedure: LUMBAR ONE KYPHOPLASTY;  Surgeon: Phylliss Bob, MD;  Location: [facility];  Service: Orthopedics;  Laterality: N/A;  LUMBAR ONE KYPHOPLASTY   TOTAL KNEE REVISION Left 10/03/2021   Procedure: LEFT TOTAL KNEE REVISION POLY SWAP WITH IRRIGATION AND DEBRIDEMENT;  Surgeon: Melrose Nakayama, MD;  Location: [facility];  Service: Orthopedics;  Laterality: Left;   UPPER GASTROINTESTINAL ENDOSCOPY     WISDOM TOOTH EXTRACTION     Social History   Socioeconomic History   Marital status: Single    Spouse name: Not on file   Number of children: 2   Years of  education: Not on file   Highest education level: Not on file  Occupational History   Occupation: Disabled   Tobacco Use   Smoking status: Never   Smokeless tobacco: Never  Vaping Use   Vaping Use: Never used  Substance and Sexual Activity   Alcohol use: Not Currently    Comment: socially   Drug use: No   Sexual activity: Not Currently    Birth control/protection: Post-menopausal  Other Topics Concern   Not on file  Social History Narrative   [place] daily    Social Determinants of Health   Financial Resource Strain: Not on file  Food Insecurity: Food Insecurity Present (06/26/2022)   Hunger Vital Sign    Worried About [place] in the Last Year: Sometimes true    Ran Out of Food in the Last Year: Sometimes true  Transportation Needs: No Transportation Needs (06/26/2022)   PRAPARE - Hydrologist (Medical): No    Lack of Transportation (Non-Medical): No  Physical Activity: Not on file  Stress: Not on file  Social Connections: Not on file  Intimate Partner Violence: Not At Risk (06/26/2022)   Humiliation, Afraid, Rape, and Kick questionnaire    Fear of Current or Ex-Partner: No    Emotionally Abused: No    Physically Abused: No    Sexually Abused: No   Current Outpatient Medications on File Prior to Visit  Medication Sig Dispense Refill   Accu-Chek Softclix Lancets lancets Use as instructed to check blood sugar 2 times daily Dx E11.9 200 each 0   albuterol (VENTOLIN HFA) 108 (90 Base) MCG/ACT inhaler Inhale 2 puffs into the lungs every 4 (four) hours as needed for wheezing or shortness of breath.     aspirin 81 MG chewable tablet Chew 1 tablet (81 mg total) by mouth daily. 90 tablet 1   bisacodyl (DULCOLAX) 10 MG suppository Place 1 suppository (10 mg total) rectally daily as needed for up to 7 doses for moderate constipation. 7 suppository 0   Blood Glucose Monitoring Suppl (ACCU-CHEK GUIDE ME) w/Device KIT Use as instructed to check  blood sugar 2 times daily Dx E11.9 1 kit 0   busPIRone (BUSPAR) 5 MG tablet Take 10 mg by mouth at bedtime.     Calcium Carb-Cholecalciferol (CALCIUM CARBONATE-VITAMIN D3 PO) Take 1 tablet by mouth daily.     clopidogrel (PLAVIX) 75 MG tablet Take 1 tablet (75 mg total) by mouth daily. 30 tablet 1  COSENTYX SENSOREADY, 300 MG, 150 MG/ML SOAJ Inject 300 mg into the skin every 30 (thirty) days.     docusate sodium (COLACE) 100 MG capsule Take 1 capsule (100 mg total) by mouth 2 (two) times daily. (Patient taking differently: Take 100 mg by mouth daily.) 10 capsule 0   DULoxetine (CYMBALTA) 30 MG capsule Take 30 mg by mouth daily.     FLOVENT HFA 110 MCG/ACT inhaler Inhale 2 puffs into the lungs in the morning and at bedtime.     glucose blood (ACCU-CHEK GUIDE) test strip USE AS INSTRUCTED TO CHECK BLOOD SUGAR 2 TIMES DAILY 100 strip 5   metFORMIN (GLUCOPHAGE) 500 MG tablet Take 1 tablet (500 mg total) by mouth daily. 30 tablet 2   pantoprazole (PROTONIX) 40 MG tablet Take 1 tablet (40 mg total) by mouth daily. 30 tablet 2   polyethylene glycol (MIRALAX / GLYCOLAX) 17 g packet Take 17 g by mouth 2 (two) times daily. (Patient taking differently: Take 17 g by mouth daily as needed for moderate constipation.) 14 each 0   pravastatin (PRAVACHOL) 40 MG tablet Take 1 tablet (40 mg total) by mouth daily. 30 tablet 0   primidone (MYSOLINE) 250 MG tablet Take 250 mg by mouth at bedtime.     Semaglutide, 1 MG/DOSE, (OZEMPIC, 1 MG/DOSE,) 2 MG/1.5ML SOPN Inject 1 mg into the skin once a week.     No current facility-administered medications on file prior to visit.   Allergies  Allergen Reactions   Lisinopril Cough   Family History  Problem Relation Age of Onset   Diabetes Other        maternal aunts and uncles   Diabetes Father    Liver disease Father    Emphysema Father        smoked   Diabetes Sister    Diabetes Brother    Sarcoidosis Brother    Emphysema Mother        smoked   Asthma Mother     Asthma Brother    Colon cancer Neg Hx    Esophageal cancer Neg Hx    Stomach cancer Neg Hx    Rectal cancer Neg Hx    PE: BP (!) 138/92 (BP Location: Right Arm, Patient Position: Sitting, Cuff Size: Normal)   Pulse 89   Ht 5' (1.524 m)   Wt 134 lb 12.8 oz (61.1 kg)   LMP  (LMP Unknown)   SpO2 97%   BMI 26.33 kg/m  Wt Readings from Last 3 Encounters:  08/16/22 134 lb 12.8 oz (61.1 kg)  08/10/22 133 lb (60.3 kg)  06/26/22 125 lb (56.7 kg)   Constitutional: overweight, in NAD Eyes:  EOMI, no exophthalmos ENT: no neck masses, no cervical lymphadenopathy Cardiovascular: RRR, No MRG Respiratory: CTA B Musculoskeletal: no deformities Skin:no rashes Neurological: no tremor with outstretched hands Diabetic Foot Exam - Simple   Simple Foot Form Diabetic Foot exam was performed with the following findings: Yes 08/16/2022  3:25 PM  Visual Inspection No deformities, no ulcerations, no other skin breakdown bilaterally: Yes Sensation Testing Intact to touch and monofilament testing bilaterally: Yes Pulse Check Posterior Tibialis and Dorsalis pulse intact bilaterally: Yes Comments Missing R hallux and 2nd toenail     ASSESSMENT: 1. DM2, non-insulin-dependent, controlled, with complications - DR - H/o TIA - CKD - PN - H/o Gastroparesis - resolved  2. HL  PLAN:  1. Patient with long-standing, uncontrolled diabetes, on oral antidiabetic regimen with low-dose metformin and also weekly GLP-1 receptor  agonist, with good control.  At last visit with Dr. Loanne Drilling, HbA1c was excellent, at 5.8%, and she had another HbA1c obtained 1.5 months ago and this was still at goal, at 6.0%.  In fact, in the last 3 years, her HbA1c levels have been in the prediabetic range. -At today's visit, we reviewed her blood sugars together.  They are at goal.  Therefore, there is no need to change her medication regimen.  She does mention that she tolerates Ozempic well, despite the previous history of  gastroparesis.  She has no GI side effects currently.  She does not lose weight uncontrollably.  If, she gained 9 pounds more recently. -For now, we discussed about continuing the same regimen.  I refilled her prescriptions. - I suggested to:  Patient Instructions  Please continue: - Metformin 500 mg daily - Ozempic 1 mg weekly  Please return in  6 months.  - check sugars at different times of the day - check 1x a day, rotating checks - discussed about CBG targets for treatment: 80-130 mg/dL before meals and <180 mg/dL after meals; target HbA1c <7%. - given foot care handout  - given instructions for hypoglycemia management "15-15 rule"  - advised for yearly eye exams  - Return to clinic in 3-4 months  2. HL - Reviewed latest lipid panel from 06/2022: LDL slightly above goal of less than 70, otherwise fractions at goal Lab Results  Component Value Date   CHOL 139 06/25/2022   HDL 36 (L) 06/25/2022   LDLCALC 73 06/25/2022   TRIG 150 (H) 06/25/2022   CHOLHDL 3.9 06/25/2022  - Continues pravastatin 40 mg daily without side effects.  Philemon Kingdom, MD PhD [facility] Endocrinology

## 2022-08-16 NOTE — Patient Instructions (Signed)
Please continue: - Metformin 500 mg daily - Ozempic 1 mg weekly  Please return in  6 months.  PATIENT INSTRUCTIONS FOR TYPE 2 DIABETES:  **Please join MyChart!** - see attached instructions about how to join if you have not done so already.  DIET AND EXERCISE Diet and exercise is an important part of diabetic treatment.  We recommended aerobic exercise in the form of brisk walking (working between 40-60% of maximal aerobic capacity, similar to brisk walking) for 150 minutes per week (such as 30 minutes five days per week) along with 3 times per week performing 'resistance' training (using various gauge rubber tubes with handles) 5-10 exercises involving the major muscle groups (upper body, lower body and core) performing 10-15 repetitions (or near fatigue) each exercise. Start at half the above goal but build slowly to reach the above goals. If limited by weight, joint pain, or disability, we recommend daily walking in a swimming pool with water up to waist to reduce pressure from joints while allow for adequate exercise.    BLOOD GLUCOSES Monitoring your blood glucoses is important for continued management of your diabetes. Please check your blood glucoses 2-4 times a day: fasting, before meals and at bedtime (you can rotate these measurements - e.g. one day check before the 3 meals, the next day check before 2 of the meals and before bedtime, etc.).   HYPOGLYCEMIA (low blood sugar) Hypoglycemia is usually a reaction to not eating, exercising, or taking too much insulin/ other diabetes drugs.  Symptoms include tremors, sweating, hunger, confusion, headache, etc. Treat IMMEDIATELY with 15 grams of Carbs: 4 glucose tablets  cup regular juice/soda 2 tablespoons raisins 4 teaspoons sugar 1 tablespoon honey Recheck blood glucose in 15 mins and repeat above if still symptomatic/blood glucose <100.  RECOMMENDATIONS TO REDUCE YOUR RISK OF DIABETIC COMPLICATIONS: * Take your prescribed  MEDICATION(S) * Follow a DIABETIC diet: Complex carbs, fiber rich foods, (monounsaturated and polyunsaturated) fats * AVOID saturated/trans fats, high fat foods, >2,300 mg salt per day. * EXERCISE at least 5 times a week for 30 minutes or preferably daily.  * DO NOT SMOKE OR DRINK more than 1 drink a day. * Check your FEET every day. Do not wear tightfitting shoes. Contact us if you develop an ulcer * See your EYE doctor once a year or more if needed * Get a FLU shot once a year * Get a PNEUMONIA vaccine once before and once after age 36 years  GOALS:  * Your Hemoglobin A1c of <7%  * fasting sugars need to be 80-130 * after meals sugars need to be <180 (2h after you start eating) * Your Systolic BP should be 938 or lower  * Your Diastolic BP should be 80 or lower  * Your HDL (Good Cholesterol) should be 40 or higher  * Your LDL (Bad Cholesterol) should be ideally <70. * Your Triglycerides should be 150 or lower  * Your Urine microalbumin (kidney function) should be <30 * Your Body Mass Index should be 25 or lower   Please consider the following ways to cut down carbs and fat and increase fiber and micronutrients in your diet: - substitute whole grain for white bread or pasta - substitute brown rice for white rice - substitute 90-calorie flat bread pieces for slices of bread when possible - substitute sweet potatoes or yams for white potatoes - substitute humus for margarine - substitute tofu for cheese when possible - substitute almond or rice milk for regular milk (  would not drink soy milk daily due to concern for soy estrogen influence on breast cancer risk) - substitute dark chocolate for other sweets when possible - substitute water - can add lemon or orange slices for taste - for diet sodas (artificial sweeteners will trick your body that you can eat sweets without getting calories and will lead you to overeating and weight gain in the long run) - do not skip breakfast or other  meals (this will slow down the metabolism and will result in more weight gain over time)  - can try smoothies made from fruit and almond/rice milk in am instead of regular breakfast - can also try old-fashioned (not instant) oatmeal made with almond/rice milk in am - order the dressing on the side when eating salad at a restaurant (pour less than half of the dressing on the salad) - eat as little meat as possible - can try juicing, but should not forget that juicing will get rid of the fiber, so would alternate with eating raw veg./fruits or drinking smoothies - use as little oil as possible, even when using olive oil - can dress a salad with a mix of balsamic vinegar and lemon juice, for e.g. - use agave nectar, stevia sugar, or regular sugar rather than artificial sweateners - steam or broil/roast veggies  - snack on veggies/fruit/nuts (unsalted, preferably) when possible, rather than processed foods - reduce or eliminate aspartame in diet (it is in diet sodas, chewing gum, etc) Read the labels!  Try to read Dr. Janene Harvey book: "Program for Reversing Diabetes" for other ideas for healthy eating.

## 2022-09-30 ENCOUNTER — Ambulatory Visit
Admission: EM | Admit: 2022-09-30 | Discharge: 2022-09-30 | Disposition: A | Discharge status: Home / Self Care | Payer: Medicare Other | Attending: Internal Medicine | Admitting: Internal Medicine

## 2022-09-30 ENCOUNTER — Ambulatory Visit (INDEPENDENT_AMBULATORY_CARE_PROVIDER_SITE_OTHER): Payer: Medicare Other

## 2022-09-30 DIAGNOSIS — R0782 Intercostal pain: Secondary | ICD-10-CM | POA: Diagnosis not present

## 2022-09-30 DIAGNOSIS — W19XXXA Unspecified fall, initial encounter: Secondary | ICD-10-CM | POA: Diagnosis not present

## 2022-09-30 DIAGNOSIS — W06XXXA Fall from bed, initial encounter: Secondary | ICD-10-CM | POA: Diagnosis not present

## 2022-09-30 DIAGNOSIS — R0781 Pleurodynia: Secondary | ICD-10-CM

## 2022-09-30 NOTE — ED Provider Notes (Signed)
[facility]    CSN: [id number] Arrival date & time: 09/30/22  1337      History   Chief Complaint Chief Complaint  Patient presents with   Rib Injury    HPI Carrie [name] is a 66 y.o. female.   Patient presents with left rib pain after a fall that occurred approximately 2 days ago.  Patient reports that she accidentally rolled out of bed landing on her left ribs.  Denies hitting head or losing consciousness.  Patient does not take any blood thinning medications other than aspirin.  Patient denies any shortness of breath.  Patient has elevated blood pressure reading but denies chest pain, shortness of breath, nausea, vomiting, headache, dizziness, blurred vision.  Has been taking her blood pressure medication as prescribed.     Past Medical History:  Diagnosis Date   Anxiety    Arthritis    psoriatic arithritis, DDD    Asthma    Depression    Diabetes mellitus    type 2   Gastritis    Gastroparesis    GERD (gastroesophageal reflux disease)    Hernia    Hyperlipemia    Hyperlipemia    Hypertension    Liver cirrhosis ([place])    Neuropathy    Bil feet re: to Diabetes   Obesity    Osteoporosis    Plantar fasciitis    Stricture and stenosis of esophagus    Stroke [facility])    TIA (transient ischemic attack)    8-10 yrs ago, no problems since   Tuberculosis    tested positive 2011, no symptoms, was on medicine for 6 months    Patient Active Problem List   Diagnosis Date Noted   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   Stroke ([place]) 06/25/2022   Fecal impaction ([place]) 04/14/2022   Proctitis 04/14/2022   Stage 3a chronic kidney disease (CKD) ([place]) 04/14/2022   Diabetic nephropathy ([place]) 04/06/2022   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   Hepatic cirrhosis ([place]) 04/06/2022   AKI superimposed on CKD IIIa 03/11/2022   Lightheadedness 03/11/2022   Anxiety and depression 03/11/2022   Type 2 diabetes mellitus with complication, without  long-term current use of insulin ([place]) 03/11/2022   Microcytic anemia 03/11/2022   Elevated d-dimer 03/11/2022   Medication monitoring encounter 11/07/2021   Psoriatic arthritis ([place]) 11/07/2021   Pressure injury of skin 04/13/2021   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[place]) 03/16/2020   Bowel obstruction ([place]) 02/08/2020   Leukocytosis 02/08/2020   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   Psoriasis 07/08/2017   Mild neurocognitive disorder 10/18/2016   Diabetes ([place]) 02/16/2016   Burn from the sun 02/25/2015   Chest pain 12/04/2013   Diabetes mellitus type II, uncontrolled 12/04/2013   Benign essential HTN 12/04/2013   Hypoglycemia 12/04/2013   Acute pharyngitis 09/11/2013   Candidiasis of mouth 09/11/2013   HBP (high blood pressure) 04/21/2013   Gastroparesis 02/25/2013   Cough 02/25/2013   Nausea and vomiting 01/06/2013   Nausea alone 05/15/2011   STRICTURE AND STENOSIS OF ESOPHAGUS 10/21/2009   Hyperlipidemia 01/09/2008   OBESITY 01/09/2008   Asthma 01/09/2008   GERD with esophageal stricture and stenosis s/p dilation  01/09/2008   HERNIA, VENTRAL 01/09/2008   ARTHRITIS 01/09/2008   Depression 03/25/2007   ANXIETY 03/25/2007    Past Surgical History:  Procedure Laterality Date   BREAST SURGERY Bilateral  biopsies both breast   CARDIAC CATHETERIZATION  08/30/2010   CESAREAN SECTION     x 2    CHOLECYSTECTOMY     COLONOSCOPY     FOOT SURGERY Left    spur removal   HERNIA REPAIR     IR FLUORO GUIDE CV LINE RIGHT  04/12/2021   IR US GUIDE VASC ACCESS RIGHT  04/12/2021   JOINT REPLACEMENT     bilat knee    KNEE SURGERY Bilateral    x 2 joint knee replacements   KYPHOPLASTY N/A 03/28/2022   Procedure: LUMBAR ONE KYPHOPLASTY;  Surgeon: Phylliss Bob, MD;  Location: [facility];  Service: Orthopedics;  Laterality: N/A;  LUMBAR ONE KYPHOPLASTY   TOTAL KNEE REVISION Left  10/03/2021   Procedure: LEFT TOTAL KNEE REVISION POLY SWAP WITH IRRIGATION AND DEBRIDEMENT;  Surgeon: Melrose Nakayama, MD;  Location: [facility];  Service: Orthopedics;  Laterality: Left;   UPPER GASTROINTESTINAL ENDOSCOPY     WISDOM TOOTH EXTRACTION      OB History   No obstetric history on file.      Home Medications    Prior to Admission medications   Medication Sig Start Date End Date Taking? Authorizing Provider  Accu-Chek Softclix Lancets lancets Use as instructed to check blood sugar 2 times daily Dx E11.9 03/23/21   Renato Shin, MD  albuterol (VENTOLIN HFA) 108 (90 Base) MCG/ACT inhaler Inhale 2 puffs into the lungs every 4 (four) hours as needed for wheezing or shortness of breath.    [provider]  aspirin 81 MG chewable tablet Chew 1 tablet (81 mg total) by mouth daily. 06/28/22   Shawna Clamp, MD  Blood Glucose Monitoring Suppl (ACCU-CHEK GUIDE ME) w/Device KIT Use as instructed to check blood sugar 2 times daily Dx E11.9 03/23/21   Renato Shin, MD  busPIRone (BUSPAR) 5 MG tablet Take 10 mg by mouth at bedtime.    [provider]  Calcium Carb-Cholecalciferol (CALCIUM CARBONATE-VITAMIN D3 PO) Take 1 tablet by mouth daily.    [provider]  COSENTYX SENSOREADY, 300 MG, 150 MG/ML SOAJ Inject 300 mg into the skin every 30 (thirty) days. 02/27/22   [provider]  docusate sodium (COLACE) 100 MG capsule Take 1 capsule (100 mg total) by mouth 2 (two) times daily. Patient taking differently: Take 100 mg by mouth daily. 04/15/22   Antonieta Pert, MD  doxycycline (VIBRA-TABS) 100 MG tablet Take 100 mg by mouth 2 (two) times daily. 08/14/22   [provider]  DULoxetine (CYMBALTA) 30 MG capsule Take 30 mg by mouth daily.    [provider]  FLOVENT HFA 110 MCG/ACT inhaler Inhale 2 puffs into the lungs in the morning and at bedtime. 03/17/21   [provider]  losartan (COZAAR) 100 MG tablet Take 100 mg by mouth every morning.  08/09/22   [provider]  metFORMIN (GLUCOPHAGE) 500 MG tablet Take 1 tablet (500 mg total) by mouth daily. 08/16/22   Philemon Kingdom, MD  pantoprazole (PROTONIX) 40 MG tablet Take 1 tablet (40 mg total) by mouth daily. 06/28/22   Shawna Clamp, MD  polyethylene glycol (MIRALAX / GLYCOLAX) 17 g packet Take 17 g by mouth 2 (two) times daily. Patient taking differently: Take 17 g by mouth daily as needed for moderate constipation. 04/15/22   Antonieta Pert, MD  pravastatin (PRAVACHOL) 40 MG tablet Take 1 tablet (40 mg total) by mouth daily. 06/27/22 07/27/22  Shawna Clamp, MD  primidone (MYSOLINE) 250 MG tablet Take 250  mg by mouth at bedtime.    [provider]  Semaglutide, 1 MG/DOSE, 4 MG/3ML SOPN Inject 1 mg as directed once a week. 08/16/22   Philemon Kingdom, MD    Family History Family History  Problem Relation Age of Onset   Diabetes Other        maternal aunts and uncles   Diabetes Father    Liver disease Father    Emphysema Father        smoked   Diabetes Sister    Diabetes Brother    Sarcoidosis Brother    Emphysema Mother        smoked   Asthma Mother    Asthma Brother    Colon cancer Neg Hx    Esophageal cancer Neg Hx    Stomach cancer Neg Hx    Rectal cancer Neg Hx     Social History Social History   Tobacco Use   Smoking status: Never   Smokeless tobacco: Never  Vaping Use   Vaping Use: Never used  Substance Use Topics   Alcohol use: Not Currently    Comment: socially   Drug use: No     Allergies   Lisinopril   Review of Systems Review of Systems Per HPI  Physical Exam Triage Vital Signs ED Triage Vitals  Enc Vitals Group     BP 09/30/22 1504 (!) 179/95     Pulse Rate 09/30/22 1504 89     Resp 09/30/22 1504 16     Temp 09/30/22 1504 97.9 F (36.6 C)     Temp Source 09/30/22 1504 Oral     SpO2 09/30/22 1504 98 %     Weight --      Height --      Head Circumference --      Peak Flow --      Pain Score 09/30/22 1503 8      Pain Loc --      Pain Edu? --      Excl. in [place]? --    No data found.  Updated Vital Signs BP (!) 179/95   Pulse 89   Temp 97.9 F (36.6 C) (Oral)   Resp 16   LMP  (LMP Unknown)   SpO2 98%   Visual Acuity Right Eye Distance:   Left Eye Distance:   Bilateral Distance:    Right Eye Near:   Left Eye Near:    Bilateral Near:     Physical Exam Constitutional:      General: She is not in acute distress.    Appearance: Normal appearance. She is not toxic-appearing or diaphoretic.  HENT:     Head: Normocephalic and atraumatic.  Eyes:     Extraocular Movements: Extraocular movements intact.     Conjunctiva/sclera: Conjunctivae normal.  Cardiovascular:     Rate and Rhythm: Normal rate and regular rhythm.     Pulses: Normal pulses.     Heart sounds: Normal heart sounds.  Pulmonary:     Effort: Pulmonary effort is normal. No respiratory distress.     Breath sounds: Normal breath sounds. No stridor. No wheezing, rhonchi or rales.  Chest:     Chest wall: Tenderness present.     Comments: Tenderness to palpation to left lower lateral ribs.  No obvious bruising, discoloration, swelling, lacerations, abrasions noted. Abdominal:     General: Bowel sounds are normal. There is no distension.     Palpations: Abdomen is soft.     Tenderness: There is no  abdominal tenderness.  Neurological:     General: No focal deficit present.     Mental Status: She is alert and oriented to person, place, and time. Mental status is at baseline.     Cranial Nerves: Cranial nerves 2-12 are intact.     Sensory: Sensation is intact.     Motor: Motor function is intact.     Coordination: Coordination is intact.     Gait: Gait is intact.  Psychiatric:        Mood and Affect: Mood normal.        Behavior: Behavior normal.        Thought Content: Thought content normal.        Judgment: Judgment normal.      UC Treatments / Results  Labs (all labs ordered are listed, but only abnormal results  are displayed) Labs Reviewed - No data to display  EKG   Radiology DG Ribs Unilateral W/Chest Left  Result Date: 09/30/2022 CLINICAL DATA:  Fall out of bed two days ago. Left-sided rib and chest pain. EXAM: LEFT RIBS AND CHEST - 3+ VIEW COMPARISON:  Chest radiograph on 03/03/2022 FINDINGS: No fracture or other bone lesions are seen involving the ribs. There is no evidence of pneumothorax or pleural effusion. Both lungs are clear. Heart size and mediastinal contours are within normal limits. IMPRESSION: Negative. Electronically Signed   By: Marlaine Hind M.D.   On: 09/30/2022 15:37    Procedures Procedures (including critical care time)  Medications Ordered in UC Medications - No data to display  Initial Impression / Assessment and Plan / UC Course  I have reviewed the triage vital signs and the nursing notes.  Pertinent labs & imaging results that were available during my care of the patient were reviewed by me and considered in my medical decision making (see chart for details).     Left rib x-ray was negative for any acute bony abnormality.  Suspect bruising related to mechanism of injury.  Patient not allowed to take NSAIDs so advised safe over-the-counter pain relievers.  Advised ice application as well.  Patient has established orthopedist so she was encouraged to follow-up with them if symptoms persist or worsen.  Encouraged patient has mildly elevated deep breathing techniques as well.  Patient has elevated blood pressure and has been taking her medication as prescribed but suspect this is due to pain.  Recheck was similar.  Patient is asymptomatic regarding blood pressure so do not think that emergent evaluation is necessary.  Patient advised to monitor blood pressure very closely at home and follow-up with PCP if it remains elevated.  Discussed return and ER precautions.  Patient verbalized understanding and was agreeable with plan. Final Clinical Impressions(s) / UC Diagnoses    Final diagnoses:  Fall, initial encounter  Rib pain on left side     Discharge Instructions      X-ray was normal.  Recommend ice application.  Follow-up with orthopedist if symptoms persist or worsen.    ED Prescriptions   None    PDMP not reviewed this encounter.   Teodora Medici, [place] 09/30/22 [phone]

## 2022-09-30 NOTE — ED Triage Notes (Signed)
Pt presents to uc with co of L sided rib pain. Pt reports she fell off the bed on Friday and since then has had difficulty and pain. Pt reports she heard a poping noise when she fell

## 2022-09-30 NOTE — Discharge Instructions (Addendum)
X-ray was normal.  Recommend ice application.  Follow-up with orthopedist if symptoms persist or worsen.

## 2022-10-02 ENCOUNTER — Telehealth: Payer: Self-pay | Admitting: Internal Medicine

## 2022-10-02 DIAGNOSIS — E1142 Type 2 diabetes mellitus with diabetic polyneuropathy: Secondary | ICD-10-CM

## 2022-10-02 MED ORDER — TRULICITY 3 MG/0.5ML ~~LOC~~ SOAJ: 3.0000 mg | SUBCUTANEOUS | 1 refills | Status: DC

## 2022-10-02 NOTE — Telephone Encounter (Signed)
We can try Trulicity 3 mg weekly. I would probably just try to send a 2 mg (1 mo) supply to see how she does with it and then we can send a 62-monthsupply if she does well, unless she prefers otherwise.

## 2022-10-02 NOTE — Telephone Encounter (Signed)
Pt ok with trying Trulicity. Will contact office if it needs to be sent to different pharmacy. Rx sent.

## 2022-10-02 NOTE — Telephone Encounter (Signed)
Patient is calling to say that she got a letter from her insurance stating that they will no longer cover Ozempic after October 15, 2022 and she wants to know what she needs to do.

## 2022-10-11 ENCOUNTER — Other Ambulatory Visit: Payer: Self-pay | Admitting: Orthopaedic Surgery

## 2022-10-24 ENCOUNTER — Telehealth: Payer: Self-pay

## 2022-10-24 NOTE — Telephone Encounter (Signed)
Called to request surgery clearance be signed and faxed back. Called and lvm advising no form received and provided fax number for form to be sent to.

## 2022-10-26 ENCOUNTER — Encounter (HOSPITAL_COMMUNITY)
Admission: EM | Disposition: A | Discharge status: Home Health | Payer: Self-pay | Source: Home / Self Care | Attending: Internal Medicine

## 2022-10-26 ENCOUNTER — Inpatient Hospital Stay (HOSPITAL_COMMUNITY): Payer: 59

## 2022-10-26 ENCOUNTER — Emergency Department (HOSPITAL_COMMUNITY): Payer: 59

## 2022-10-26 ENCOUNTER — Inpatient Hospital Stay (HOSPITAL_COMMUNITY)
Admission: EM | Admit: 2022-10-26 | Discharge: 2022-10-29 | DRG: 522 | Disposition: A | Discharge status: Home Health | Payer: 59 | Attending: Internal Medicine | Admitting: Internal Medicine

## 2022-10-26 ENCOUNTER — Encounter (HOSPITAL_COMMUNITY): Payer: Self-pay

## 2022-10-26 ENCOUNTER — Inpatient Hospital Stay (HOSPITAL_COMMUNITY): Payer: 59 | Admitting: Certified Registered Nurse Anesthetist

## 2022-10-26 ENCOUNTER — Other Ambulatory Visit: Payer: Self-pay

## 2022-10-26 DIAGNOSIS — W19XXXA Unspecified fall, initial encounter: Secondary | ICD-10-CM

## 2022-10-26 DIAGNOSIS — I739 Peripheral vascular disease, unspecified: Secondary | ICD-10-CM | POA: Diagnosis not present

## 2022-10-26 DIAGNOSIS — Z825 Family history of asthma and other chronic lower respiratory diseases: Secondary | ICD-10-CM

## 2022-10-26 DIAGNOSIS — D62 Acute posthemorrhagic anemia: Secondary | ICD-10-CM | POA: Diagnosis present

## 2022-10-26 DIAGNOSIS — E871 Hypo-osmolality and hyponatremia: Secondary | ICD-10-CM | POA: Diagnosis present

## 2022-10-26 DIAGNOSIS — J4540 Moderate persistent asthma, uncomplicated: Secondary | ICD-10-CM | POA: Diagnosis present

## 2022-10-26 DIAGNOSIS — Z8673 Personal history of transient ischemic attack (TIA), and cerebral infarction without residual deficits: Secondary | ICD-10-CM

## 2022-10-26 DIAGNOSIS — M810 Age-related osteoporosis without current pathological fracture: Secondary | ICD-10-CM | POA: Diagnosis present

## 2022-10-26 DIAGNOSIS — S72002A Fracture of unspecified part of neck of left femur, initial encounter for closed fracture: Secondary | ICD-10-CM | POA: Diagnosis present

## 2022-10-26 DIAGNOSIS — Z7985 Long-term (current) use of injectable non-insulin antidiabetic drugs: Secondary | ICD-10-CM | POA: Diagnosis not present

## 2022-10-26 DIAGNOSIS — D638 Anemia in other chronic diseases classified elsewhere: Secondary | ICD-10-CM | POA: Diagnosis present

## 2022-10-26 DIAGNOSIS — E118 Type 2 diabetes mellitus with unspecified complications: Secondary | ICD-10-CM

## 2022-10-26 DIAGNOSIS — E785 Hyperlipidemia, unspecified: Secondary | ICD-10-CM | POA: Diagnosis present

## 2022-10-26 DIAGNOSIS — D509 Iron deficiency anemia, unspecified: Secondary | ICD-10-CM | POA: Diagnosis present

## 2022-10-26 DIAGNOSIS — Z7901 Long term (current) use of anticoagulants: Secondary | ICD-10-CM

## 2022-10-26 DIAGNOSIS — Z833 Family history of diabetes mellitus: Secondary | ICD-10-CM

## 2022-10-26 DIAGNOSIS — N179 Acute kidney failure, unspecified: Secondary | ICD-10-CM | POA: Diagnosis present

## 2022-10-26 DIAGNOSIS — W010XXA Fall on same level from slipping, tripping and stumbling without subsequent striking against object, initial encounter: Secondary | ICD-10-CM | POA: Diagnosis present

## 2022-10-26 DIAGNOSIS — Y92009 Unspecified place in unspecified non-institutional (private) residence as the place of occurrence of the external cause: Secondary | ICD-10-CM | POA: Diagnosis not present

## 2022-10-26 DIAGNOSIS — Z96653 Presence of artificial knee joint, bilateral: Secondary | ICD-10-CM | POA: Diagnosis present

## 2022-10-26 DIAGNOSIS — Z7984 Long term (current) use of oral hypoglycemic drugs: Secondary | ICD-10-CM | POA: Diagnosis not present

## 2022-10-26 DIAGNOSIS — K219 Gastro-esophageal reflux disease without esophagitis: Secondary | ICD-10-CM | POA: Diagnosis present

## 2022-10-26 DIAGNOSIS — D72829 Elevated white blood cell count, unspecified: Secondary | ICD-10-CM | POA: Diagnosis present

## 2022-10-26 DIAGNOSIS — I10 Essential (primary) hypertension: Secondary | ICD-10-CM | POA: Diagnosis not present

## 2022-10-26 DIAGNOSIS — Z888 Allergy status to other drugs, medicaments and biological substances status: Secondary | ICD-10-CM | POA: Diagnosis not present

## 2022-10-26 DIAGNOSIS — K3184 Gastroparesis: Secondary | ICD-10-CM | POA: Diagnosis present

## 2022-10-26 DIAGNOSIS — E119 Type 2 diabetes mellitus without complications: Secondary | ICD-10-CM

## 2022-10-26 DIAGNOSIS — Z7982 Long term (current) use of aspirin: Secondary | ICD-10-CM

## 2022-10-26 DIAGNOSIS — L409 Psoriasis, unspecified: Secondary | ICD-10-CM | POA: Diagnosis present

## 2022-10-26 DIAGNOSIS — F418 Other specified anxiety disorders: Secondary | ICD-10-CM

## 2022-10-26 DIAGNOSIS — E1143 Type 2 diabetes mellitus with diabetic autonomic (poly)neuropathy: Secondary | ICD-10-CM | POA: Diagnosis present

## 2022-10-26 DIAGNOSIS — E1140 Type 2 diabetes mellitus with diabetic neuropathy, unspecified: Secondary | ICD-10-CM | POA: Diagnosis present

## 2022-10-26 DIAGNOSIS — Z79899 Other long term (current) drug therapy: Secondary | ICD-10-CM

## 2022-10-26 HISTORY — PX: TOTAL HIP ARTHROPLASTY: SHX124

## 2022-10-26 LAB — CBC WITH DIFFERENTIAL/PLATELET
Abs Immature Granulocytes: 0.13 10*3/uL — ABNORMAL HIGH (ref 0.00–0.07)
Abs Immature Granulocytes: 0.12 10*3/uL — ABNORMAL HIGH (ref 0.00–0.07)
Basophils Absolute: 0.1 10*3/uL (ref 0.0–0.1)
Basophils Absolute: 0.1 10*3/uL (ref 0.0–0.1)
Basophils Relative: 0 %
Basophils Relative: 1 %
Eosinophils Absolute: 0 10*3/uL (ref 0.0–0.5)
Eosinophils Absolute: 0 10*3/uL (ref 0.0–0.5)
Eosinophils Relative: 0 %
Eosinophils Relative: 0 %
HCT: 32.5 % — ABNORMAL LOW (ref 36.0–46.0)
HCT: 28.5 % — ABNORMAL LOW (ref 36.0–46.0)
Hemoglobin: 9.8 g/dL — ABNORMAL LOW (ref 12.0–15.0)
Hemoglobin: 8.7 g/dL — ABNORMAL LOW (ref 12.0–15.0)
Immature Granulocytes: 1 %
Immature Granulocytes: 1 %
Lymphocytes Relative: 23 %
Lymphocytes Relative: 21 %
Lymphs Abs: 3.2 10*3/uL (ref 0.7–4.0)
Lymphs Abs: 3.5 10*3/uL (ref 0.7–4.0)
MCH: 21.9 pg — ABNORMAL LOW (ref 26.0–34.0)
MCH: 22 pg — ABNORMAL LOW (ref 26.0–34.0)
MCHC: 30.2 g/dL (ref 30.0–36.0)
MCHC: 30.5 g/dL (ref 30.0–36.0)
MCV: 72.7 fL — ABNORMAL LOW (ref 80.0–100.0)
MCV: 72 fL — ABNORMAL LOW (ref 80.0–100.0)
Monocytes Absolute: 1.2 10*3/uL — ABNORMAL HIGH (ref 0.1–1.0)
Monocytes Absolute: 1.6 10*3/uL — ABNORMAL HIGH (ref 0.1–1.0)
Monocytes Relative: 8 %
Monocytes Relative: 9 %
Neutro Abs: 9.5 10*3/uL — ABNORMAL HIGH (ref 1.7–7.7)
Neutro Abs: 11.7 10*3/uL — ABNORMAL HIGH (ref 1.7–7.7)
Neutrophils Relative %: 68 %
Neutrophils Relative %: 68 %
Platelets: 379 10*3/uL (ref 150–400)
Platelets: 326 10*3/uL (ref 150–400)
RBC: 4.47 MIL/uL (ref 3.87–5.11)
RBC: 3.96 MIL/uL (ref 3.87–5.11)
RDW: 17.2 % — ABNORMAL HIGH (ref 11.5–15.5)
RDW: 17 % — ABNORMAL HIGH (ref 11.5–15.5)
WBC: 14.1 10*3/uL — ABNORMAL HIGH (ref 4.0–10.5)
WBC: 17 10*3/uL — ABNORMAL HIGH (ref 4.0–10.5)
nRBC: 0 % (ref 0.0–0.2)
nRBC: 0 % (ref 0.0–0.2)

## 2022-10-26 LAB — URINALYSIS, COMPLETE (UACMP) WITH MICROSCOPIC
Bacteria, UA: NONE SEEN
Bilirubin Urine: NEGATIVE
Glucose, UA: 50 mg/dL — AB
Hgb urine dipstick: NEGATIVE
Ketones, ur: NEGATIVE mg/dL
Leukocytes,Ua: NEGATIVE
Nitrite: NEGATIVE
Protein, ur: NEGATIVE mg/dL
Specific Gravity, Urine: 1.012 (ref 1.005–1.030)
pH: 5 (ref 5.0–8.0)

## 2022-10-26 LAB — BASIC METABOLIC PANEL
Anion gap: 11 (ref 5–15)
BUN: 38 mg/dL — ABNORMAL HIGH (ref 8–23)
CO2: 20 mmol/L — ABNORMAL LOW (ref 22–32)
Calcium: 9.1 mg/dL (ref 8.9–10.3)
Chloride: 101 mmol/L (ref 98–111)
Creatinine, Ser: 1.18 mg/dL — ABNORMAL HIGH (ref 0.44–1.00)
GFR, Estimated: 51 mL/min — ABNORMAL LOW (ref >60)
Glucose, Bld: 165 mg/dL — ABNORMAL HIGH (ref 70–99)
Potassium: 3.9 mmol/L (ref 3.5–5.1)
Sodium: 132 mmol/L — ABNORMAL LOW (ref 135–145)

## 2022-10-26 LAB — COMPREHENSIVE METABOLIC PANEL
ALT: 24 U/L (ref 0–44)
AST: 36 U/L (ref 15–41)
Albumin: 3.3 g/dL — ABNORMAL LOW (ref 3.5–5.0)
Alkaline Phosphatase: 57 U/L (ref 38–126)
Anion gap: 9 (ref 5–15)
BUN: 43 mg/dL — ABNORMAL HIGH (ref 8–23)
CO2: 21 mmol/L — ABNORMAL LOW (ref 22–32)
Calcium: 8.6 mg/dL — ABNORMAL LOW (ref 8.9–10.3)
Chloride: 105 mmol/L (ref 98–111)
Creatinine, Ser: 0.63 mg/dL (ref 0.44–1.00)
GFR, Estimated: >60 mL/min (ref >60)
Glucose, Bld: 146 mg/dL — ABNORMAL HIGH (ref 70–99)
Potassium: 3.7 mmol/L (ref 3.5–5.1)
Sodium: 135 mmol/L (ref 135–145)
Total Bilirubin: 0.2 mg/dL — ABNORMAL LOW (ref 0.3–1.2)
Total Protein: 7.3 g/dL (ref 6.5–8.1)

## 2022-10-26 LAB — PROTIME-INR
INR: 1.3 — ABNORMAL HIGH (ref 0.8–1.2)
Prothrombin Time: 15.7 seconds — ABNORMAL HIGH (ref 11.4–15.2)

## 2022-10-26 LAB — GLUCOSE, CAPILLARY
Glucose-Capillary: 140 mg/dL — ABNORMAL HIGH (ref 70–99)
Glucose-Capillary: 175 mg/dL — ABNORMAL HIGH (ref 70–99)
Glucose-Capillary: 209 mg/dL — ABNORMAL HIGH (ref 70–99)
Glucose-Capillary: 98 mg/dL (ref 70–99)

## 2022-10-26 LAB — HEMOGLOBIN AND HEMATOCRIT, BLOOD
HCT: 23.9 % — ABNORMAL LOW (ref 36.0–46.0)
Hemoglobin: 7.2 g/dL — ABNORMAL LOW (ref 12.0–15.0)

## 2022-10-26 LAB — SURGICAL PCR SCREEN
MRSA, PCR: NEGATIVE
Staphylococcus aureus: NEGATIVE

## 2022-10-26 LAB — MAGNESIUM: Magnesium: 1.9 mg/dL (ref 1.7–2.4)

## 2022-10-26 LAB — VITAMIN D 25 HYDROXY (VIT D DEFICIENCY, FRACTURES): Vit D, 25-Hydroxy: 36.89 ng/mL (ref 30–100)

## 2022-10-26 SURGERY — ARTHROPLASTY, HIP, TOTAL, ANTERIOR APPROACH
Anesthesia: General | Site: Hip | Laterality: Left

## 2022-10-26 MED ORDER — PANTOPRAZOLE SODIUM 40 MG PO TBEC
40.0000 mg | DELAYED_RELEASE_TABLET | Freq: Every day | ORAL | Status: DC
  Administered 2022-10-26 – 2022-10-29 (×4): 40 mg via ORAL
  Filled 2022-10-26 (×4): qty 1

## 2022-10-26 MED ORDER — SODIUM CHLORIDE 0.9 % IR SOLN
Status: DC | PRN
  Administered 2022-10-26: 1000 mL

## 2022-10-26 MED ORDER — MIDAZOLAM HCL 5 MG/5ML IJ SOLN
INTRAMUSCULAR | Status: DC | PRN
  Administered 2022-10-26: 2 mg via INTRAVENOUS

## 2022-10-26 MED ORDER — ROCURONIUM BROMIDE 10 MG/ML (PF) SYRINGE
PREFILLED_SYRINGE | INTRAVENOUS | Status: DC | PRN
  Administered 2022-10-26: 60 mg via INTRAVENOUS
  Administered 2022-10-26: 20 mg via INTRAVENOUS

## 2022-10-26 MED ORDER — DOCUSATE SODIUM 100 MG PO CAPS
100.0000 mg | ORAL_CAPSULE | Freq: Two times a day (BID) | ORAL | Status: DC
  Administered 2022-10-26 – 2022-10-29 (×6): 100 mg via ORAL
  Filled 2022-10-26 (×6): qty 1

## 2022-10-26 MED ORDER — TIZANIDINE HCL 2 MG PO TABS: 2.0000 mg | ORAL_TABLET | Freq: Three times a day (TID) | ORAL | 0 refills | Status: DC | PRN

## 2022-10-26 MED ORDER — PHENYLEPHRINE 80 MCG/ML (10ML) SYRINGE FOR IV PUSH (FOR BLOOD PRESSURE SUPPORT)
PREFILLED_SYRINGE | INTRAVENOUS | Status: DC | PRN
  Administered 2022-10-26 (×4): 160 ug via INTRAVENOUS

## 2022-10-26 MED ORDER — BUPIVACAINE-EPINEPHRINE 0.5% -1:200000 IJ SOLN
INTRAMUSCULAR | Status: DC | PRN
  Administered 2022-10-26: 30 mL

## 2022-10-26 MED ORDER — DIPHENHYDRAMINE HCL 12.5 MG/5ML PO ELIX: 12.5000 mg | ORAL_SOLUTION | ORAL | Status: DC | PRN

## 2022-10-26 MED ORDER — BUSPIRONE HCL 5 MG PO TABS
10.0000 mg | ORAL_TABLET | Freq: Every day | ORAL | Status: DC
  Administered 2022-10-26 – 2022-10-28 (×3): 10 mg via ORAL
  Filled 2022-10-26 (×3): qty 2

## 2022-10-26 MED ORDER — CHLORHEXIDINE GLUCONATE 4 % EX LIQD: 60.0000 mL | Freq: Once | CUTANEOUS | Status: DC

## 2022-10-26 MED ORDER — BUPIVACAINE LIPOSOME 1.3 % IJ SUSP
INTRAMUSCULAR | Status: DC | PRN
  Administered 2022-10-26: 10 mL

## 2022-10-26 MED ORDER — HYDROCODONE-ACETAMINOPHEN 5-325 MG PO TABS: 1.0000 | ORAL_TABLET | Freq: Four times a day (QID) | ORAL | 0 refills | Status: DC | PRN

## 2022-10-26 MED ORDER — SUGAMMADEX SODIUM 200 MG/2ML IV SOLN
INTRAVENOUS | Status: DC | PRN
  Administered 2022-10-26: 150 mg via INTRAVENOUS

## 2022-10-26 MED ORDER — ACETAMINOPHEN 325 MG PO TABS
650.0000 mg | ORAL_TABLET | Freq: Four times a day (QID) | ORAL | Status: DC | PRN
  Administered 2022-10-26 – 2022-10-27 (×3): 650 mg via ORAL
  Filled 2022-10-26 (×3): qty 2

## 2022-10-26 MED ORDER — PROMETHAZINE HCL 25 MG/ML IJ SOLN: 6.2500 mg | INTRAMUSCULAR | Status: DC | PRN

## 2022-10-26 MED ORDER — FERROUS SULFATE 325 (65 FE) MG PO TABS
325.0000 mg | ORAL_TABLET | Freq: Two times a day (BID) | ORAL | Status: DC
  Administered 2022-10-27 – 2022-10-29 (×5): 325 mg via ORAL
  Filled 2022-10-26 (×5): qty 1

## 2022-10-26 MED ORDER — METFORMIN HCL 500 MG PO TABS
500.0000 mg | ORAL_TABLET | Freq: Every day | ORAL | Status: DC
  Administered 2022-10-27 – 2022-10-28 (×2): 500 mg via ORAL
  Filled 2022-10-26 (×2): qty 1

## 2022-10-26 MED ORDER — POLYETHYLENE GLYCOL 3350 17 G PO PACK
17.0000 g | PACK | Freq: Every day | ORAL | Status: DC | PRN
  Administered 2022-10-26: 17 g via ORAL
  Filled 2022-10-26: qty 1

## 2022-10-26 MED ORDER — KETAMINE HCL 10 MG/ML IJ SOLN
INTRAMUSCULAR | Status: DC | PRN
  Administered 2022-10-26: 30 mg via INTRAVENOUS

## 2022-10-26 MED ORDER — MIDAZOLAM HCL 2 MG/2ML IJ SOLN
INTRAMUSCULAR | Status: AC
  Filled 2022-10-26: qty 2

## 2022-10-26 MED ORDER — FENTANYL CITRATE PF 50 MCG/ML IJ SOSY: 25.0000 ug | PREFILLED_SYRINGE | INTRAMUSCULAR | Status: DC | PRN

## 2022-10-26 MED ORDER — ONDANSETRON HCL 4 MG/2ML IJ SOLN
4.0000 mg | Freq: Four times a day (QID) | INTRAMUSCULAR | Status: DC | PRN
  Administered 2022-10-26 – 2022-10-27 (×2): 4 mg via INTRAVENOUS
  Filled 2022-10-26 (×2): qty 2

## 2022-10-26 MED ORDER — KETAMINE HCL 10 MG/ML IJ SOLN
INTRAMUSCULAR | Status: AC
  Filled 2022-10-26: qty 1

## 2022-10-26 MED ORDER — HYDROMORPHONE HCL 1 MG/ML IJ SOLN
0.5000 mg | Freq: Once | INTRAMUSCULAR | Status: AC
  Administered 2022-10-26: 0.5 mg via INTRAVENOUS
  Filled 2022-10-26: qty 1

## 2022-10-26 MED ORDER — HYDROMORPHONE HCL 1 MG/ML IJ SOLN
1.0000 mg | Freq: Once | INTRAMUSCULAR | Status: AC
  Administered 2022-10-26: 1 mg via INTRAVENOUS
  Filled 2022-10-26: qty 1

## 2022-10-26 MED ORDER — MAGNESIUM CITRATE PO SOLN: 1.0000 | Freq: Once | ORAL | Status: DC | PRN

## 2022-10-26 MED ORDER — LORAZEPAM 2 MG/ML IJ SOLN: 0.5000 mg | Freq: Four times a day (QID) | INTRAMUSCULAR | Status: DC | PRN

## 2022-10-26 MED ORDER — ORAL CARE MOUTH RINSE: 15.0000 mL | Freq: Once | OROMUCOSAL | Status: AC

## 2022-10-26 MED ORDER — HYDROMORPHONE HCL 1 MG/ML IJ SOLN
1.0000 mg | INTRAMUSCULAR | Status: DC | PRN
  Administered 2022-10-26: 1 mg via INTRAVENOUS
  Filled 2022-10-26: qty 1

## 2022-10-26 MED ORDER — MORPHINE SULFATE (PF) 4 MG/ML IV SOLN
4.0000 mg | Freq: Once | INTRAVENOUS | Status: AC
  Administered 2022-10-26: 4 mg via INTRAVENOUS
  Filled 2022-10-26: qty 1

## 2022-10-26 MED ORDER — BUPIVACAINE-EPINEPHRINE (PF) 0.5% -1:200000 IJ SOLN
INTRAMUSCULAR | Status: AC
  Filled 2022-10-26: qty 30

## 2022-10-26 MED ORDER — LACTATED RINGERS IV SOLN: INTRAVENOUS | Status: DC

## 2022-10-26 MED ORDER — DULOXETINE HCL 30 MG PO CPEP
30.0000 mg | ORAL_CAPSULE | Freq: Every day | ORAL | Status: DC
  Administered 2022-10-27 – 2022-10-29 (×3): 30 mg via ORAL
  Filled 2022-10-26 (×4): qty 1

## 2022-10-26 MED ORDER — ASPIRIN 81 MG PO CHEW
81.0000 mg | CHEWABLE_TABLET | Freq: Two times a day (BID) | ORAL | Status: DC
  Administered 2022-10-26 – 2022-10-29 (×6): 81 mg via ORAL
  Filled 2022-10-26 (×6): qty 1

## 2022-10-26 MED ORDER — DEXAMETHASONE SODIUM PHOSPHATE 10 MG/ML IJ SOLN
INTRAMUSCULAR | Status: DC | PRN
  Administered 2022-10-26: 10 mg via INTRAVENOUS

## 2022-10-26 MED ORDER — CEFAZOLIN SODIUM-DEXTROSE 2-4 GM/100ML-% IV SOLN
2.0000 g | INTRAVENOUS | Status: AC
  Administered 2022-10-26: 2 g via INTRAVENOUS

## 2022-10-26 MED ORDER — CELECOXIB 200 MG PO CAPS: 200.0000 mg | ORAL_CAPSULE | Freq: Two times a day (BID) | ORAL | 0 refills | Status: AC

## 2022-10-26 MED ORDER — POVIDONE-IODINE 10 % EX SWAB: 2.0000 | Freq: Once | CUTANEOUS | Status: DC

## 2022-10-26 MED ORDER — DOCUSATE SODIUM 100 MG PO CAPS: 100.0000 mg | ORAL_CAPSULE | Freq: Two times a day (BID) | ORAL | Status: DC

## 2022-10-26 MED ORDER — WATER FOR IRRIGATION, STERILE IR SOLN
Status: DC | PRN
  Administered 2022-10-26: 2000 mL

## 2022-10-26 MED ORDER — BISACODYL 5 MG PO TBEC: 5.0000 mg | DELAYED_RELEASE_TABLET | Freq: Every day | ORAL | Status: DC | PRN

## 2022-10-26 MED ORDER — ONDANSETRON HCL 4 MG/2ML IJ SOLN
INTRAMUSCULAR | Status: AC
  Filled 2022-10-26: qty 2

## 2022-10-26 MED ORDER — SODIUM CHLORIDE 0.9 % IV SOLN: INTRAVENOUS | Status: DC

## 2022-10-26 MED ORDER — CHLORHEXIDINE GLUCONATE 0.12 % MT SOLN
15.0000 mL | Freq: Once | OROMUCOSAL | Status: AC
  Administered 2022-10-26: 15 mL via OROMUCOSAL

## 2022-10-26 MED ORDER — PROPOFOL 10 MG/ML IV BOLUS
INTRAVENOUS | Status: DC | PRN
  Administered 2022-10-26: 120 mg via INTRAVENOUS

## 2022-10-26 MED ORDER — FENTANYL CITRATE (PF) 100 MCG/2ML IJ SOLN
INTRAMUSCULAR | Status: DC | PRN
  Administered 2022-10-26 (×4): 50 ug via INTRAVENOUS

## 2022-10-26 MED ORDER — ALBUTEROL SULFATE (2.5 MG/3ML) 0.083% IN NEBU
2.5000 mg | INHALATION_SOLUTION | RESPIRATORY_TRACT | Status: DC | PRN
  Administered 2022-10-27: 2.5 mg via RESPIRATORY_TRACT
  Filled 2022-10-26: qty 3

## 2022-10-26 MED ORDER — CEFAZOLIN SODIUM-DEXTROSE 2-4 GM/100ML-% IV SOLN
INTRAVENOUS | Status: AC
  Filled 2022-10-26: qty 100

## 2022-10-26 MED ORDER — INSULIN ASPART 100 UNIT/ML IJ SOLN
0.0000 [IU] | Freq: Four times a day (QID) | INTRAMUSCULAR | Status: DC
  Administered 2022-10-26: 2 [IU] via SUBCUTANEOUS
  Administered 2022-10-27: 1 [IU] via SUBCUTANEOUS
  Administered 2022-10-27: 2 [IU] via SUBCUTANEOUS
  Administered 2022-10-28 – 2022-10-29 (×3): 1 [IU] via SUBCUTANEOUS
  Filled 2022-10-26: qty 0.06

## 2022-10-26 MED ORDER — TRANEXAMIC ACID-NACL 1000-0.7 MG/100ML-% IV SOLN: 1000.0000 mg | INTRAVENOUS | Status: DC

## 2022-10-26 MED ORDER — NALOXONE HCL 0.4 MG/ML IJ SOLN: 0.4000 mg | INTRAMUSCULAR | Status: DC | PRN

## 2022-10-26 MED ORDER — LIDOCAINE 2% (20 MG/ML) 5 ML SYRINGE
INTRAMUSCULAR | Status: DC | PRN
  Administered 2022-10-26: 60 mg via INTRAVENOUS

## 2022-10-26 MED ORDER — BUPIVACAINE LIPOSOME 1.3 % IJ SUSP
INTRAMUSCULAR | Status: AC
  Filled 2022-10-26: qty 10

## 2022-10-26 MED ORDER — TRANEXAMIC ACID-NACL 1000-0.7 MG/100ML-% IV SOLN
1000.0000 mg | INTRAVENOUS | Status: AC
  Administered 2022-10-26: 1000 mg via INTRAVENOUS

## 2022-10-26 MED ORDER — FENTANYL CITRATE (PF) 250 MCG/5ML IJ SOLN
INTRAMUSCULAR | Status: AC
  Filled 2022-10-26: qty 5

## 2022-10-26 MED ORDER — ONDANSETRON HCL 4 MG/2ML IJ SOLN
INTRAMUSCULAR | Status: DC | PRN
  Administered 2022-10-26: 4 mg via INTRAVENOUS

## 2022-10-26 MED ORDER — LABETALOL HCL 5 MG/ML IV SOLN
10.0000 mg | INTRAVENOUS | Status: DC | PRN
  Administered 2022-10-26: 10 mg via INTRAVENOUS
  Filled 2022-10-26: qty 4

## 2022-10-26 MED ORDER — HYDROMORPHONE HCL 1 MG/ML IJ SOLN
1.0000 mg | INTRAMUSCULAR | Status: DC | PRN
  Administered 2022-10-26 – 2022-10-28 (×4): 1 mg via INTRAVENOUS
  Filled 2022-10-26 (×4): qty 1

## 2022-10-26 MED ORDER — ASPIRIN 325 MG PO TBEC: 325.0000 mg | DELAYED_RELEASE_TABLET | Freq: Every day | ORAL | 0 refills | Status: DC

## 2022-10-26 MED ORDER — PRIMIDONE 250 MG PO TABS
250.0000 mg | ORAL_TABLET | Freq: Every day | ORAL | Status: DC
  Administered 2022-10-26 – 2022-10-28 (×3): 250 mg via ORAL
  Filled 2022-10-26 (×5): qty 1

## 2022-10-26 MED ORDER — ACETAMINOPHEN 650 MG RE SUPP: 650.0000 mg | Freq: Four times a day (QID) | RECTAL | Status: DC | PRN

## 2022-10-26 MED ORDER — TRANEXAMIC ACID-NACL 1000-0.7 MG/100ML-% IV SOLN
INTRAVENOUS | Status: AC
  Filled 2022-10-26: qty 100

## 2022-10-26 MED ORDER — FLUTICASONE PROPIONATE HFA 110 MCG/ACT IN AERO: 2.0000 | INHALATION_SPRAY | Freq: Two times a day (BID) | RESPIRATORY_TRACT | Status: DC

## 2022-10-26 SURGICAL SUPPLY — 48 items
BAG COUNTER SPONGE SURGICOUNT (BAG) IMPLANT
BAG SPEC THK2 15X12 ZIP CLS (MISCELLANEOUS)
BAG SPNG CNTER NS LX DISP (BAG) ×1
BAG ZIPLOCK 12X15 (MISCELLANEOUS) IMPLANT
BALL HIP CERAMIC (Hips) IMPLANT
BENZOIN TINCTURE PRP APPL 2/3 (GAUZE/BANDAGES/DRESSINGS) IMPLANT
BLADE SAW SGTL 18X1.27X75 (BLADE) ×1 IMPLANT
BLADE SURG SZ10 CARB STEEL (BLADE) ×2 IMPLANT
CLSR STERI-STRIP ANTIMIC 1/2X4 (GAUZE/BANDAGES/DRESSINGS) IMPLANT
COVER PERINEAL POST (MISCELLANEOUS) ×1 IMPLANT
COVER SURGICAL LIGHT HANDLE (MISCELLANEOUS) ×1 IMPLANT
DRAPE FOOT SWITCH (DRAPES) ×1 IMPLANT
DRAPE STERI IOBAN 125X83 (DRAPES) ×1 IMPLANT
DRAPE U-SHAPE 47X51 STRL (DRAPES) ×2 IMPLANT
DRSG AQUACEL AG ADV 3.5X 6 (GAUZE/BANDAGES/DRESSINGS) ×1 IMPLANT
DURAPREP 26ML APPLICATOR (WOUND CARE) ×1 IMPLANT
ELECT BLADE TIP CTD 4 INCH (ELECTRODE) ×1 IMPLANT
ELECT REM PT RETURN 15FT ADLT (MISCELLANEOUS) ×1 IMPLANT
ELIMINATOR HOLE APEX DEPUY (Hips) IMPLANT
FEM STEM 12/14 TAPER SZ 4 HIP (Orthopedic Implant) ×1 IMPLANT
FEMORAL STEM 12/14 TPR SZ4 HIP (Orthopedic Implant) IMPLANT
GAUZE XEROFORM 1X8 LF (GAUZE/BANDAGES/DRESSINGS) IMPLANT
GLOVE BIOGEL PI IND STRL 8 (GLOVE) ×2 IMPLANT
GLOVE ECLIPSE 7.5 STRL STRAW (GLOVE) ×2 IMPLANT
GOWN STRL REUS W/ TWL XL LVL3 (GOWN DISPOSABLE) ×2 IMPLANT
GOWN STRL REUS W/TWL XL LVL3 (GOWN DISPOSABLE) ×2
HIP BALL CERAMIC (Hips) ×1 IMPLANT
HOLDER FOLEY CATH W/STRAP (MISCELLANEOUS) ×1 IMPLANT
HOOD PEEL AWAY T7 (MISCELLANEOUS) ×2 IMPLANT
KIT TURNOVER KIT A (KITS) IMPLANT
LINER ACETABULAR 32X50 (Liner) IMPLANT
NEEDLE HYPO 22GX1.5 SAFETY (NEEDLE) ×1 IMPLANT
PACK ANTERIOR HIP CUSTOM (KITS) ×1 IMPLANT
PIN SECT CUP 50MM (Hips) IMPLANT
SPIKE FLUID TRANSFER (MISCELLANEOUS) ×1 IMPLANT
STAPLER VISISTAT 35W (STAPLE) IMPLANT
STRIP CLOSURE SKIN 1/2X4 (GAUZE/BANDAGES/DRESSINGS) IMPLANT
SUT ETHIBOND NAB CT1 #1 30IN (SUTURE) ×2 IMPLANT
SUT MNCRL AB 3-0 PS2 18 (SUTURE) IMPLANT
SUT VIC AB 0 CT1 36 (SUTURE) ×1 IMPLANT
SUT VIC AB 1 CT1 36 (SUTURE) ×1 IMPLANT
SUT VIC AB 2-0 CT1 27 (SUTURE) ×1
SUT VIC AB 2-0 CT1 TAPERPNT 27 (SUTURE) ×1 IMPLANT
SUT VIC AB 3-0 CT1 27 (SUTURE)
SUT VIC AB 3-0 CT1 TAPERPNT 27 (SUTURE) IMPLANT
TRAY FOLEY MTR SLVR 16FR STAT (SET/KITS/TRAYS/PACK) ×1 IMPLANT
TRAY FOLEY W/BAG SLVR 14FR LF (SET/KITS/TRAYS/PACK) IMPLANT
TUBE SUCTION HIGH CAP CLEAR NV (SUCTIONS) IMPLANT

## 2022-10-26 NOTE — ED Provider Notes (Signed)
[facility] Emergency Department Provider Note MRN:  [id number]  Arrival date & time: 10/26/22     Chief Complaint   Fall  History of Present Illness   Carrie [name] is a 67 y.o. year-old female presents to the ED with chief complaint of fall and hip pain.  She states that she tripped and fell while brining in her garbage cans.  States that she landed on her left hip.  She complains of pain with movement and palpation.  Was given 142mg intranasal fentanyl.  History provided by patient.   Review of Systems  Pertinent positive and negative review of systems noted in HPI.    Physical Exam   Vitals:   10/26/22 0308 10/26/22 0315  BP: (!) 197/92 (!) 189/98  Pulse: 89   Resp: 16   Temp:    SpO2: 100%     CONSTITUTIONAL:  uncomfortable-appearing, NAD NEURO:  Alert and oriented x 3, CN 3-12 grossly intact EYES:  eyes equal and reactive ENT/NECK:  Supple, no stridor  CARDIO:  normal rate, regular rhythm, appears well-perfused  PULM:  No respiratory distress, CTAB GI/GU:  non-distended,  MSK/SPINE: Shortening of left lower extremity concerning for fracture, tenderness to palpation over left greater trochanter SKIN:  no rash, atraumatic   *Additional and/or pertinent findings included in MDM below  Diagnostic and Interventional Summary    EKG Interpretation  Date/Time:    Ventricular Rate:    PR Interval:    QRS Duration:   QT Interval:    QTC Calculation:   R Axis:     Text Interpretation:         Labs Reviewed  CBC WITH DIFFERENTIAL/PLATELET - Abnormal; Notable for the following components:      Result Value   WBC 14.1 (*)    Hemoglobin 9.8 (*)    HCT 32.5 (*)    MCV 72.7 (*)    MCH 21.9 (*)    RDW 17.2 (*)    Neutro Abs 9.5 (*)    Monocytes Absolute 1.2 (*)    Abs Immature Granulocytes 0.13 (*)    All other components within normal limits  BASIC METABOLIC PANEL - Abnormal; Notable for the following components:   Sodium 132 (*)     CO2 20 (*)    Glucose, Bld 165 (*)    BUN 38 (*)    Creatinine, Ser 1.18 (*)    GFR, Estimated 51 (*)    All other components within normal limits  CBC WITH DIFFERENTIAL/PLATELET  COMPREHENSIVE METABOLIC PANEL  MAGNESIUM  PROTIME-INR    DG Chest 1 View  Final Result    DG HIP UNILAT WITH PELVIS 2-3 VIEWS LEFT  Final Result      Medications  acetaminophen (TYLENOL) tablet 650 mg (has no administration in time range)    Or  acetaminophen (TYLENOL) suppository 650 mg (has no administration in time range)  naloxone (NARCAN) injection 0.4 mg (has no administration in time range)  HYDROmorphone (DILAUDID) injection 1 mg (has no administration in time range)  ondansetron (ZOFRAN) injection 4 mg (has no administration in time range)  labetalol (NORMODYNE) injection 10 mg (10 mg Intravenous Given 10/26/22 0321)  LORazepam (ATIVAN) injection 0.5 mg (has no administration in time range)  HYDROmorphone (DILAUDID) injection 0.5 mg (0.5 mg Intravenous Given 10/26/22 0127)  morphine (PF) 4 MG/ML injection 4 mg (4 mg Intravenous Given 10/26/22 0206)     Procedures  /  Critical Care Procedures  ED Course and Medical  Decision Making  I have reviewed the triage vital signs, the nursing notes, and pertinent available records from the EMR.  Social Determinants Affecting Complexity of Care: Patient has no clinically significant social determinants affecting this chief complaint..   ED Course:    Medical Decision Making Patient here with mechanical fall.  She landed on her left hip.  Physical exam concerning for hip fracture.  Will check plain films.  Will check labs and EKG and chest x-ray for preoperative clearance.  Problems Addressed: Closed fracture of neck of left femur, initial encounter [facility]): acute illness or injury Hyponatremia: undiagnosed new problem with uncertain prognosis  Amount and/or Complexity of Data Reviewed Labs: ordered.    Details: Leukocytosis to  14.1 Hyponatremia in the 132 Radiology: ordered and independent interpretation performed.    Details: Femoral neck fracture ECG/medicine tests: ordered.  Risk Prescription drug management. Decision regarding hospitalization.     Consultants: I discussed the case with Hospitalist, Dr. Velia Meyer, who is appreciated for admitting. I consulted with Dr. Lucia Gaskins, from [facility] orthopedic, who will have the patient seen in the morning.  Patient is an established patient with [facility] and requested to be seen by [facility] orthopedic.  Treatment and Plan: Patient's exam and diagnostic results are concerning for femoral neck fracture.  Feel that patient will need admission to the hospital for further treatment and evaluation.    Final Clinical Impressions(s) / ED Diagnoses     ICD-10-CM   1. Closed fracture of neck of left femur, initial encounter ([place])  S72.002A     2. Hyponatremia  E87.1       ED Discharge Orders     None         Discharge Instructions Discussed with and Provided to Patient:   Discharge Instructions   None      Montine Circle, PA-C 10/26/22 [place], [place], DO 10/27/22 [phone]

## 2022-10-26 NOTE — Consult Note (Signed)
Reason for Consult: Left femoral neck fracture Referring Physician: Hospitalist  Carrie [name] is an 67 y.o. female.  HPI: Patient is a 67 year old female patient of our practice who was scheduled for elective shoulder surgery later this month.  She unfortunately fell and broke her left hip.  We were consulted for evaluation and management of left hip fracture.  The patient is a Hydrographic surveyor.  Past Medical History:  Diagnosis Date   Anxiety    Arthritis    psoriatic arithritis, DDD    Asthma    Depression    Diabetes mellitus    type 2   Gastritis    Gastroparesis    GERD (gastroesophageal reflux disease)    Hernia    Hyperlipemia    Hyperlipemia    Hypertension    Liver cirrhosis (HCC)    Nausea and vomiting 01/06/2013   Neuropathy    Bil feet re: to Diabetes   Obesity    Osteoporosis    Plantar fasciitis    Stricture and stenosis of esophagus    Stroke ([place])    TIA (transient ischemic attack)    8-10 yrs ago, no problems since   Tuberculosis    tested positive 2011, no symptoms, was on medicine for 6 months    Past Surgical History:  Procedure Laterality Date   BREAST SURGERY Bilateral    biopsies both breast   CARDIAC CATHETERIZATION  08/30/2010   CESAREAN SECTION     x 2    CHOLECYSTECTOMY     COLONOSCOPY     FOOT SURGERY Left    spur removal   HERNIA REPAIR     IR FLUORO GUIDE CV LINE RIGHT  04/12/2021   IR US GUIDE VASC ACCESS RIGHT  04/12/2021   JOINT REPLACEMENT     bilat knee    KNEE SURGERY Bilateral    x 2 joint knee replacements   KYPHOPLASTY N/A 03/28/2022   Procedure: LUMBAR ONE KYPHOPLASTY;  Surgeon: Phylliss Bob, MD;  Location: [facility];  Service: Orthopedics;  Laterality: N/A;  LUMBAR ONE KYPHOPLASTY   TOTAL KNEE REVISION Left 10/03/2021   Procedure: LEFT TOTAL KNEE REVISION POLY SWAP WITH IRRIGATION AND DEBRIDEMENT;  Surgeon: Melrose Nakayama, MD;  Location: [facility];  Service: Orthopedics;  Laterality: Left;   UPPER GASTROINTESTINAL  ENDOSCOPY     WISDOM TOOTH EXTRACTION      Family History  Problem Relation Age of Onset   Diabetes Other        maternal aunts and uncles   Diabetes Father    Liver disease Father    Emphysema Father        smoked   Diabetes Sister    Diabetes Brother    Sarcoidosis Brother    Emphysema Mother        smoked   Asthma Mother    Asthma Brother    Colon cancer Neg Hx    Esophageal cancer Neg Hx    Stomach cancer Neg Hx    Rectal cancer Neg Hx     Social History:  reports that she has never smoked. She has never used smokeless tobacco. She reports that she does not currently use alcohol. She reports that she does not use drugs.  Allergies:  Allergies  Allergen Reactions   Lisinopril Cough    Medications: I have reviewed the patient's current medications.  Results for orders placed or performed during the hospital encounter of 10/26/22 (from the past 48 hour(s))  CBC with Differential  Status: Abnormal   Collection Time: 10/26/22  1:00 AM  Result Value Ref Range   WBC 14.1 (H) 4.0 - 10.5 K/uL   RBC 4.47 3.87 - 5.11 MIL/uL   Hemoglobin 9.8 (L) 12.0 - 15.0 g/dL   HCT 32.5 (L) 36.0 - 46.0 %   MCV 72.7 (L) 80.0 - 100.0 fL   MCH 21.9 (L) 26.0 - 34.0 pg   MCHC 30.2 30.0 - 36.0 g/dL   RDW 17.2 (H) 11.5 - 15.5 %   Platelets 379 150 - 400 K/uL   nRBC 0.0 0.0 - 0.2 %   Neutrophils Relative % 68 %   Neutro Abs 9.5 (H) 1.7 - 7.7 K/uL   Lymphocytes Relative 23 %   Lymphs Abs 3.2 0.7 - 4.0 K/uL   Monocytes Relative 8 %   Monocytes Absolute 1.2 (H) 0.1 - 1.0 K/uL   Eosinophils Relative 0 %   Eosinophils Absolute 0.0 0.0 - 0.5 K/uL   Basophils Relative 0 %   Basophils Absolute 0.1 0.0 - 0.1 K/uL   Immature Granulocytes 1 %   Abs Immature Granulocytes 0.13 (H) 0.00 - 0.07 K/uL    Comment: Performed at [facility], [place] [address] [place], [place] [address]  Basic metabolic panel     Status: Abnormal   Collection Time: 10/26/22  1:00 AM  Result Value  Ref Range   Sodium 132 (L) 135 - 145 mmol/L   Potassium 3.9 3.5 - 5.1 mmol/L   Chloride 101 98 - 111 mmol/L   CO2 20 (L) 22 - 32 mmol/L   Glucose, Bld 165 (H) 70 - 99 mg/dL    Comment: Glucose reference range applies only to samples taken after fasting for at least 8 hours.   BUN 38 (H) 8 - 23 mg/dL   Creatinine, Ser 1.18 (H) 0.44 - 1.00 mg/dL   Calcium 9.1 8.9 - 10.3 mg/dL   GFR, Estimated 51 (L) >60 mL/min    Comment: (NOTE) Calculated using the CKD-EPI Creatinine Equation (2021)    Anion gap 11 5 - 15    Comment: Performed at [facility], [place] [address], [place], [place] [address]  CBC with Differential/Platelet     Status: Abnormal   Collection Time: 10/26/22  4:46 AM  Result Value Ref Range   WBC 17.0 (H) 4.0 - 10.5 K/uL   RBC 3.96 3.87 - 5.11 MIL/uL   Hemoglobin 8.7 (L) 12.0 - 15.0 g/dL    Comment: Reticulocyte Hemoglobin testing may be clinically indicated, consider ordering this additional test [id number]    HCT 28.5 (L) 36.0 - 46.0 %   MCV 72.0 (L) 80.0 - 100.0 fL   MCH 22.0 (L) 26.0 - 34.0 pg   MCHC 30.5 30.0 - 36.0 g/dL   RDW 17.0 (H) 11.5 - 15.5 %   Platelets 326 150 - 400 K/uL   nRBC 0.0 0.0 - 0.2 %   Neutrophils Relative % 68 %   Neutro Abs 11.7 (H) 1.7 - 7.7 K/uL   Lymphocytes Relative 21 %   Lymphs Abs 3.5 0.7 - 4.0 K/uL   Monocytes Relative 9 %   Monocytes Absolute 1.6 (H) 0.1 - 1.0 K/uL   Eosinophils Relative 0 %   Eosinophils Absolute 0.0 0.0 - 0.5 K/uL   Basophils Relative 1 %   Basophils Absolute 0.1 0.0 - 0.1 K/uL   Immature Granulocytes 1 %   Abs Immature Granulocytes 0.12 (H) 0.00 - 0.07 K/uL    Comment: Performed  at [facility], [place] [address] [place], [place] [address]  Comprehensive metabolic panel     Status: Abnormal   Collection Time: 10/26/22  4:46 AM  Result Value Ref Range   Sodium 135 135 - 145 mmol/L   Potassium 3.7 3.5 - 5.1 mmol/L   Chloride 105 98 - 111 mmol/L   CO2 21 (L) 22 - 32 mmol/L    Glucose, Bld 146 (H) 70 - 99 mg/dL    Comment: Glucose reference range applies only to samples taken after fasting for at least 8 hours.   BUN 43 (H) 8 - 23 mg/dL   Creatinine, Ser 0.63 0.44 - 1.00 mg/dL   Calcium 8.6 (L) 8.9 - 10.3 mg/dL   Total Protein 7.3 6.5 - 8.1 g/dL   Albumin 3.3 (L) 3.5 - 5.0 g/dL   AST 36 15 - 41 U/L   ALT 24 0 - 44 U/L   Alkaline Phosphatase 57 38 - 126 U/L   Total Bilirubin 0.2 (L) 0.3 - 1.2 mg/dL   GFR, Estimated >60 >60 mL/min    Comment: (NOTE) Calculated using the CKD-EPI Creatinine Equation (2021)    Anion gap 9 5 - 15    Comment: Performed at [facility], [place] [address] [place], [place] [address]  Magnesium     Status: None   Collection Time: 10/26/22  4:46 AM  Result Value Ref Range   Magnesium 1.9 1.7 - 2.4 mg/dL    Comment: Performed at [facility], [place] [address] [place], [place] [address]  Protime-INR     Status: Abnormal   Collection Time: 10/26/22  4:46 AM  Result Value Ref Range   Prothrombin Time 15.7 (H) 11.4 - 15.2 seconds   INR 1.3 (H) 0.8 - 1.2    Comment: (NOTE) INR goal varies based on device and disease states. Performed at [facility], [place] [address] [place], [place] [address]     DG HIP UNILAT WITH PELVIS 2-3 VIEWS LEFT  Result Date: 10/26/2022 CLINICAL DATA:  Status post fall with left hip pain. EXAM: DG HIP (WITH OR WITHOUT PELVIS) 2-3V LEFT COMPARISON:  None Available. FINDINGS: An acute fracture deformity is seen extending through the neck of the proximal left femur. There is no evidence of dislocation. Soft tissue structures are unremarkable. IMPRESSION: Acute fracture of the proximal left femur. Electronically Signed   By: Virgina Norfolk M.D.   On: 10/26/2022 01:58   DG Chest 1 View  Result Date: 10/26/2022 CLINICAL DATA:  Status post fall. EXAM: CHEST  1 VIEW COMPARISON:  September 30, 2022. FINDINGS: The heart size and mediastinal contours are within  normal limits. Both lungs are clear. Evidence of prior vertebroplasty is seen within the upper lumbar spine. IMPRESSION: No active cardiopulmonary disease. Electronically Signed   By: Virgina Norfolk M.D.   On: 10/26/2022 01:57    ROS ROS: I have reviewed the patient's review of systems thoroughly and there are no positive responses as relates to the HPI.  Blood pressure (!) 154/88, pulse 87, temperature 98.3 F (36.8 C), temperature source Oral, resp. rate 15, SpO2 95 %. Physical Exam Well-developed well-nourished patient in no acute distress. Alert and oriented x3 HEENT:within normal limits Cardiac: Regular rate and rhythm Pulmonary: Lungs clear to auscultation Abdomen: Soft and nontender.  Normal active bowel sounds  Musculoskeletal: (Left hip is externally rotated and shortened.  She has pain with all range of motion.  She has neurovascular intact distally.)  Assessment/Plan: 67 year old otherwise healthy female who is chronically anemic who fell and suffered a femoral neck fracture.  We had a long discussion with the patient and ultimately feel that total hip replacement is the appropriate course of action.  She is well aware that there is a significant risk that she could need a blood transfusion.  She is consented to need for blood.  She understands additional risks of infection, dislocation, need for further surgery, and the slight risk of death in and around the time of surgery.  Understanding these risks she still wishes to proceed with surgical intervention.  We will plan on surgical intervention as soon as operating room available and she is ready for surgery.  Carrie [name] 10/26/2022, 8:25 AM

## 2022-10-26 NOTE — Evaluation (Signed)
Physical Therapy Evaluation Patient Details Name: Carrie [name] MRN: [id number] DOB: [birth year] Today's Date: 10/26/2022  History of Present Illness  67 year old female with history of diabetes type 2, hypertension, moderate persistent asthma, anemia of chronic disease, CVA, Bil TKAs who presented from home with complaints of left hip pain after a fall.  Pt sustained left femoral neck fx and s/p left total hip arthroplasty on 10/26/22.  Clinical Impression  Patient is s/p above surgery resulting in functional limitations due to the deficits listed below (see PT Problem List).  Patient will benefit from skilled PT to increase their independence and safety with mobility to allow discharge to the venue listed below.  Pt assisted OOB to recliner POD #0.  Pt reports increased pain with weight bearing so not able to ambulate today however RN notified of pain.  Anticipate pt to progress well and return home.  Recommend OPPT if pt has transportation, otherwise may need HHPT.  Pt reports she was supposed to have shoulder surgery later this month for bone spur, however she does feel like she will be able to use RW during this recovery.  Pt has steps to enter home and will need to practice steps prior to d/c.        Recommendations for follow up therapy are one component of a multi-disciplinary discharge planning process, led by the attending physician.  Recommendations may be updated based on patient status, additional functional criteria and insurance authorization.  Follow Up Recommendations Outpatient PT      Assistance Recommended at Discharge PRN  Patient can return home with the following  Help with stairs or ramp for entrance;Assistance with cooking/housework;Assist for transportation    Equipment Recommendations None recommended by PT  Recommendations for Other Services       Functional Status Assessment Patient has had a recent decline in their functional status and demonstrates the ability  to make significant improvements in function in a reasonable and predictable amount of time.     Precautions / Restrictions Precautions Precautions: None;Fall Restrictions Weight Bearing Restrictions: No Other Position/Activity Restrictions: WBAT      Mobility  Bed Mobility Overal bed mobility: Needs Assistance Bed Mobility: Supine to Sit     Supine to sit: Min assist     General bed mobility comments: assist for Lt LE    Transfers Overall transfer level: Needs assistance Equipment used: Rolling walker (2 wheels) Transfers: Sit to/from Stand, Bed to chair/wheelchair/BSC Sit to Stand: Min assist Stand pivot transfers: Min assist         General transfer comment: light assist to rise and steady, cues for UE and LE positioning for pain control, pt attempted a few steps however reports 8/10 pain with weight bearing so assisted to recliner    Ambulation/Gait                  Stairs            Wheelchair Mobility    Modified Rankin (Stroke Patients Only)       Balance                                             Pertinent Vitals/Pain Pain Assessment Pain Assessment: 0-10 Pain Score: 8  Pain Location: left hip with weight bearing Pain Descriptors / Indicators: Sore, Operative site guarding Pain Intervention(s): Repositioned, Monitored during session, Patient requesting pain  meds-RN notified    Home Living Family/patient expects to be discharged to:: Private residence Living Arrangements: Children Available Help at Discharge: Family;Available PRN/intermittently Type of Home: House Home Access: Stairs to enter Entrance Stairs-Rails: Can reach both;Left;Right Entrance Stairs-Number of Steps: 3   Home Layout: One level Home Equipment: Conservation officer, nature (2 wheels);Cane - single Gaffer (4 wheels)      Prior Function Prior Level of Function : Independent/Modified Independent                      Hand Dominance        Extremity/Trunk Assessment        Lower Extremity Assessment Lower Extremity Assessment: LLE deficits/detail LLE Deficits / Details: anticipated left hip weakness s/p surgery; pt reports 8/10 pain with weight bearing, assist required for positioning/assisting Lt LE LLE Sensation: history of peripheral neuropathy       Communication   Communication: No difficulties  Cognition Arousal/Alertness: Awake/alert Behavior During Therapy: WFL for tasks assessed/performed Overall Cognitive Status: Within Functional Limits for tasks assessed                                          General Comments      Exercises     Assessment/Plan    PT Assessment Patient needs continued PT services  PT Problem List Decreased strength;Decreased activity tolerance;Decreased mobility;Pain       PT Treatment Interventions Gait training;DME instruction;Therapeutic exercise;Functional mobility training;Therapeutic activities;Patient/family education;Balance training;Stair training    PT Goals (Current goals can be found in the Care Plan section)  Acute Rehab PT Goals PT Goal Formulation: With patient Time For Goal Achievement: 11/02/22 Potential to Achieve Goals: Good    Frequency Min 5X/week     Co-evaluation               AM-PAC PT "6 Clicks" Mobility  Outcome Measure Help needed turning from your back to your side while in a flat bed without using bedrails?: A Little Help needed moving from lying on your back to sitting on the side of a flat bed without using bedrails?: A Little Help needed moving to and from a bed to a chair (including a wheelchair)?: A Little Help needed standing up from a chair using your arms (e.g., wheelchair or bedside chair)?: A Little Help needed to walk in hospital room?: A Lot Help needed climbing 3-5 steps with a railing? : A Lot 6 Click Score: 16    End of Session Equipment Utilized During Treatment: Gait  belt Activity Tolerance: Patient tolerated treatment well;Patient limited by pain Patient left: in chair;with call bell/phone within reach;with chair alarm set Nurse Communication: Mobility status;Patient requests pain meds PT Visit Diagnosis: Difficulty in walking, not elsewhere classified (R26.2)    Time: 1435-1450 PT Time Calculation (min) (ACUTE ONLY): 15 min   Charges:   PT Evaluation $PT Eval Low Complexity: 1 Low        Kati PT, DPT Physical Therapist Acute Rehabilitation Services Preferred contact method: Secure Chat Weekend Pager Only: [phone] Office: [place] 10/26/2022, 3:35 PM

## 2022-10-26 NOTE — ED Notes (Signed)
Spoke with Short stay and they will be coming to get patient shortly

## 2022-10-26 NOTE — ED Notes (Addendum)
Carrie [name] from [facility] informed me that the hospitalist completed his notes and will be coming for patient soon

## 2022-10-26 NOTE — Anesthesia Procedure Notes (Signed)
Procedure Name: Intubation Date/Time: 10/26/2022 9:10 AM  Performed by: Santa Lighter, MDPre-anesthesia Checklist: Patient identified, Emergency Drugs available, Suction available and Patient being monitored Patient Re-evaluated:Patient Re-evaluated prior to induction Oxygen Delivery Method: Circle system utilized Preoxygenation: Pre-oxygenation with 100% oxygen Induction Type: IV induction Ventilation: Mask ventilation without difficulty Laryngoscope Size: Mac and 3 Grade View: Grade I Tube type: Oral Tube size: 7.0 mm Number of attempts: 1 Airway Equipment and Method: Stylet and Oral airway Placement Confirmation: ETT inserted through vocal cords under direct vision, positive ETCO2 and breath sounds checked- equal and bilateral Secured at: 23 cm Tube secured with: Tape Dental Injury: Teeth and Oropharynx as per pre-operative assessment

## 2022-10-26 NOTE — H&P (Signed)
History and Physical      Carrie [name] MWU:[id number] DOB: 1956/02/02 DOA: 10/26/2022  PCP: Bartholome Bill, MD  Patient coming from: home   I have personally briefly reviewed patient's old medical records in [place]  Chief Complaint: left hip pain  HPI: Carrie [name] is a 67 y.o. female with medical history significant for type 2 diabetes mellitus, essential pretension, moderate persistent asthma, anemia of chronic disease, who is admitted to [facility] on 10/26/2022 with acute left femoral neck fracture in setting of ground-level mechanical fall after presenting from home to [facility] ED complaining of acute left hip pain.   Patient reports tripping while attempting to ambulate at home, resulting in a ground level fall during which left hip was the principal point of contact with the floor below. As a result of this fall, the patient reports immediate development of sharp left hip pain, with radiation into the left groin. States that this pain has been constant since onset, with exacerbation when attempting to move the left lower extremity.  As a consequence of the associated intensity of this discomfort, reports that she is unable to bear weight on the left lower extremity at this time.  This is relative to baseline functional status in which the patient lives independently as well as baseline ambulatory status in which the patient reports the ability to ambulate without assistance, including no need for support devices.  Otherwise, denies any acute arthralgias or myalgias as a result of the above fall.  Denies any acute numbness or paresthesias in bilateral lower extremities, and confirms that left hip representations a native joint.  This was an unwitnessed fall, however, the patient reports that she did not hit head as a component of this fall, and denies any associated loss of consciousness.  Denies any preceding or associated chest pain, shortness of breath, diaphoresis,  palpitations, nausea, vomiting, dizziness, presyncope, or syncope.  Denies any subsequent headache, neck pain, blurry vision, or diplopia.  On a daily baby aspirin at home, with most recent dose occurring on the morning of 10/25/2022, but otherwise no blood thinners as an outpatient.  denies any known history of coronary artery disease or CHF. denies any recent orthopnea, PND, or peripheral edema.         ED Course:  Vital signs in the ED were notable for the following: Afebrile; rate 78-94 symptoms (initially in the 180s, since interval pain control; respiratory rate 16-20, oxygen saturation 100% on room air.  Labs were notable for the following: BMP notable for creatinine 1.18 compared measures of her serum creatinine data point of 1.29 on 08/10/2022, glucose 165.  CBC notable for white cell count 14,100 with 60% neutrophils, hemoglobin 9.8 associated with microcytic finding, unchanged relative to most recent prior hemoglobin dated 1 of 9.8 on 08/10/2022, platelet count 379.  INR 1.3.  Type and screen ordered.  Per my interpretation, EKG in ED demonstrated the following: Sinus rhythm with heart 95, normal intervals, no evidence of T wave or ST changes, including no evidence of ST elevation.  Imaging and additional notable ED work-up: Chest ray, peripheral radiology read, shows no evidence of acute cardiopulmonary process, clear evidence of infiltrate, edema, effusion, or pneumothorax.  Pain films of the left hip/pelvis, peripheral radiology read shows acute fracture of the proximal left femur.  EDP discussed the patient's case and imaging with the on-call orthopedic surgeon for [facility],, Dr. Lucia Gaskins, who recommended admission to the hospitalist service for further evaluation and  management of acute left  hip fracture.  Dr. Lucia Gaskins conveys that Dawayne Patricia will formally consult, will evaluate the patient in the morning.  While in the ED, the following were administered: Dilaudid 0.5 mg  IV x 1, morphine 4 mg IV x 1.  Subsequently, the patient was admitted for further evaluation management of acute left malleolar fracture.    Review of Systems: As per HPI otherwise 10 point review of systems negative.   Past Medical History:  Diagnosis Date   Anxiety    Arthritis    psoriatic arithritis, DDD    Asthma    Depression    Diabetes mellitus    type 2   Gastritis    Gastroparesis    GERD (gastroesophageal reflux disease)    Hernia    Hyperlipemia    Hyperlipemia    Hypertension    Liver cirrhosis (HCC)    Nausea and vomiting 01/06/2013   Neuropathy    Bil feet re: to Diabetes   Obesity    Osteoporosis    Plantar fasciitis    Stricture and stenosis of esophagus    Stroke ([place])    TIA (transient ischemic attack)    8-10 yrs ago, no problems since   Tuberculosis    tested positive 2011, no symptoms, was on medicine for 6 months    Past Surgical History:  Procedure Laterality Date   BREAST SURGERY Bilateral    biopsies both breast   CARDIAC CATHETERIZATION  08/30/2010   CESAREAN SECTION     x 2    CHOLECYSTECTOMY     COLONOSCOPY     FOOT SURGERY Left    spur removal   HERNIA REPAIR     IR FLUORO GUIDE CV LINE RIGHT  04/12/2021   IR US GUIDE VASC ACCESS RIGHT  04/12/2021   JOINT REPLACEMENT     bilat knee    KNEE SURGERY Bilateral    x 2 joint knee replacements   KYPHOPLASTY N/A 03/28/2022   Procedure: LUMBAR ONE KYPHOPLASTY;  Surgeon: Phylliss Bob, MD;  Location: [place];  Service: Orthopedics;  Laterality: N/A;  LUMBAR ONE KYPHOPLASTY   TOTAL KNEE REVISION Left 10/03/2021   Procedure: LEFT TOTAL KNEE REVISION POLY SWAP WITH IRRIGATION AND DEBRIDEMENT;  Surgeon: Melrose Nakayama, MD;  Location: [facility];  Service: Orthopedics;  Laterality: Left;   UPPER GASTROINTESTINAL ENDOSCOPY     WISDOM TOOTH EXTRACTION      Social History:  reports that she has never smoked. She has never used smokeless tobacco. She reports that she does not currently use  alcohol. She reports that she does not use drugs.   Allergies  Allergen Reactions   Lisinopril Cough    Family History  Problem Relation Age of Onset   Diabetes Other        maternal aunts and uncles   Diabetes Father    Liver disease Father    Emphysema Father        smoked   Diabetes Sister    Diabetes Brother    Sarcoidosis Brother    Emphysema Mother        smoked   Asthma Mother    Asthma Brother    Colon cancer Neg Hx    Esophageal cancer Neg Hx    Stomach cancer Neg Hx    Rectal cancer Neg Hx     Family history reviewed and not pertinent    Prior to Admission medications   Medication Sig Start Date End Date Taking?  Authorizing Provider  Accu-Chek Softclix Lancets lancets Use as instructed to check blood sugar 2 times daily Dx E11.9 03/23/21   Renato Shin, MD  albuterol (VENTOLIN HFA) 108 (90 Base) MCG/ACT inhaler Inhale 2 puffs into the lungs every 4 (four) hours as needed for wheezing or shortness of breath.    [provider]  aspirin 81 MG chewable tablet Chew 1 tablet (81 mg total) by mouth daily. 06/28/22   Shawna Clamp, MD  Blood Glucose Monitoring Suppl (ACCU-CHEK GUIDE ME) w/Device KIT Use as instructed to check blood sugar 2 times daily Dx E11.9 03/23/21   Renato Shin, MD  busPIRone (BUSPAR) 5 MG tablet Take 10 mg by mouth at bedtime.    [provider]  Calcium Carb-Cholecalciferol (CALCIUM CARBONATE-VITAMIN D3 PO) Take 1 tablet by mouth daily.    [provider]  COSENTYX SENSOREADY, 300 MG, 150 MG/ML SOAJ Inject 300 mg into the skin every 30 (thirty) days. 02/27/22   [provider]  docusate sodium (COLACE) 100 MG capsule Take 1 capsule (100 mg total) by mouth 2 (two) times daily. Patient taking differently: Take 100 mg by mouth daily. 04/15/22   Antonieta Pert, MD  doxycycline (VIBRA-TABS) 100 MG tablet Take 100 mg by mouth 2 (two) times daily. 08/14/22   [provider]  Dulaglutide (TRULICITY) 3 GU/4.4IH SOPN  Inject 3 mg as directed once a week. 10/02/22   Philemon Kingdom, MD  DULoxetine (CYMBALTA) 30 MG capsule Take 30 mg by mouth daily.    [provider]  FLOVENT HFA 110 MCG/ACT inhaler Inhale 2 puffs into the lungs in the morning and at bedtime. 03/17/21   [provider]  losartan (COZAAR) 100 MG tablet Take 100 mg by mouth every morning. 08/09/22   [provider]  metFORMIN (GLUCOPHAGE) 500 MG tablet Take 1 tablet (500 mg total) by mouth daily. 08/16/22   Philemon Kingdom, MD  pantoprazole (PROTONIX) 40 MG tablet Take 1 tablet (40 mg total) by mouth daily. 06/28/22   Shawna Clamp, MD  polyethylene glycol (MIRALAX / GLYCOLAX) 17 g packet Take 17 g by mouth 2 (two) times daily. Patient taking differently: Take 17 g by mouth daily as needed for moderate constipation. 04/15/22   Antonieta Pert, MD  pravastatin (PRAVACHOL) 40 MG tablet Take 1 tablet (40 mg total) by mouth daily. 06/27/22 07/27/22  Shawna Clamp, MD  primidone (MYSOLINE) 250 MG tablet Take 250 mg by mouth at bedtime.    [provider]     Objective    Physical Exam: Vitals:   10/26/22 0100 10/26/22 0112 10/26/22 0205  BP:  (!) 220/110 (!) 204/105  Pulse:  (!) 110 95  Resp:  18 16  Temp: 98.1 F (36.7 C) 98.1 F (36.7 C)   TempSrc: Oral Oral   SpO2:  100% 100%    General: appears to be stated age; alert, oriented Skin: warm, dry, no rash Head:  AT/[place] Mouth:  Oral mucosa membranes appear dry, normal dentition Neck: supple; trachea midline Heart:  RRR; did not appreciate any M/R/G Lungs: CTAB, did not appreciate any wheezes, rales, or rhonchi Abdomen: + BS; soft, ND, NT Vascular: 2+ pedal pulses b/l; 2+ radial pulses b/l Extremities: no peripheral edema, no muscle wasting; left lower extremity externally rotated and shorter relative to right lower extremity Neuro: sensation intact in upper and lower extremities b/l; deferred strength evaluation of left extremity in setting of acute left  hip fracture.     Labs on Admission: I have  personally reviewed following labs and imaging studies  CBC: Recent Labs  Lab 10/26/22 0100  WBC 14.1*  NEUTROABS 9.5*  HGB 9.8*  HCT 32.5*  MCV 72.7*  PLT 361   Basic Metabolic Panel: Recent Labs  Lab 10/26/22 0100  NA 132*  K 3.9  CL 101  CO2 20*  GLUCOSE 165*  BUN 38*  CREATININE 1.18*  CALCIUM 9.1   GFR: CrCl cannot be calculated (Unknown ideal weight.). Liver Function Tests: No results for input(s): "AST", "ALT", "ALKPHOS", "BILITOT", "PROT", "ALBUMIN" in the last 168 hours. No results for input(s): "LIPASE", "AMYLASE" in the last 168 hours. No results for input(s): "AMMONIA" in the last 168 hours. Coagulation Profile: No results for input(s): "INR", "PROTIME" in the last 168 hours. Cardiac Enzymes: No results for input(s): "CKTOTAL", "CKMB", "CKMBINDEX", "TROPONINI" in the last 168 hours. BNP (last 3 results) No results for input(s): "PROBNP" in the last 8760 hours. HbA1C: No results for input(s): "HGBA1C" in the last 72 hours. CBG: No results for input(s): "GLUCAP" in the last 168 hours. Lipid Profile: No results for input(s): "CHOL", "HDL", "LDLCALC", "TRIG", "CHOLHDL", "LDLDIRECT" in the last 72 hours. Thyroid Function Tests: No results for input(s): "TSH", "T4TOTAL", "FREET4", "T3FREE", "THYROIDAB" in the last 72 hours. Anemia Panel: No results for input(s): "VITAMINB12", "FOLATE", "FERRITIN", "TIBC", "IRON", "RETICCTPCT" in the last 72 hours. Urine analysis:    Component Value Date/Time   COLORURINE YELLOW 04/14/2022 0105   APPEARANCEUR CLOUDY (A) 04/14/2022 0105   LABSPEC 1.024 04/14/2022 0105   PHURINE 8.0 04/14/2022 0105   GLUCOSEU 50 (A) 04/14/2022 0105   HGBUR MODERATE (A) 04/14/2022 0105   BILIRUBINUR NEGATIVE 04/14/2022 0105   KETONESUR 5 (A) 04/14/2022 0105   PROTEINUR 30 (A) 04/14/2022 0105   UROBILINOGEN 0.2 06/25/2015 1600   NITRITE NEGATIVE 04/14/2022 0105   LEUKOCYTESUR LARGE (A)  04/14/2022 0105    Radiological Exams on Admission: DG HIP UNILAT WITH PELVIS 2-3 VIEWS LEFT  Result Date: 10/26/2022 CLINICAL DATA:  Status post fall with left hip pain. EXAM: DG HIP (WITH OR WITHOUT PELVIS) 2-3V LEFT COMPARISON:  None Available. FINDINGS: An acute fracture deformity is seen extending through the neck of the proximal left femur. There is no evidence of dislocation. Soft tissue structures are unremarkable. IMPRESSION: Acute fracture of the proximal left femur. Electronically Signed   By: Virgina Norfolk M.D.   On: 10/26/2022 01:58   DG Chest 1 View  Result Date: 10/26/2022 CLINICAL DATA:  Status post fall. EXAM: CHEST  1 VIEW COMPARISON:  September 30, 2022. FINDINGS: The heart size and mediastinal contours are within normal limits. Both lungs are clear. Evidence of prior vertebroplasty is seen within the upper lumbar spine. IMPRESSION: No active cardiopulmonary disease. Electronically Signed   By: Virgina Norfolk M.D.   On: 10/26/2022 01:57      Assessment/Plan    Principal Problem:   Closed left hip fracture (HCC) Active Problems:   DM2 (diabetes mellitus, type 2) ([place])   Essential hypertension   Leukocytosis   Fall at home, initial encounter   Moderate persistent asthma   Anemia of chronic disease     #) Acute left femoral neck fracture: confirmed via presenting plain films and stemming from ground level mechanical fall without associated loss of consciousness that occurred earlier on the day of admission, as further described above, resulting in immediate develop of acute left hip pain.  At this time, the left lower extremity appears neurovascularly intact, and patient reports adequate pain control.  7 daily baby aspirin, not on any additional blood thinners at home, as above.     The patient's case/imaging were discussed with the on-call orthopedic surgeon, Dr. Lucia Gaskins of [facility],  who recommended admission to the hospitalist service for further  evaluation/management of acute  left hip fx, with plan for [facility] to formally consult and eval patient in the AM (1/12).   Gupta Score for this patient in the context of anticipated aforementioned orthopedic surgery conveys a  0.16% perioperative risk for significant cardiac event. No evidence to suggest acutely decompensated heart failure or acute MI. Consequently, no absolute contraindications to proceeding with proposed orthopedic surgery at this time.   Plan: Formal orthopedic surgery consult for definitive surgical management. NPO after MN. No pharmacologic anticoagulation leading up to this anticipated surgery. SCD's. Prn IV Dilaudid. Anticipate postoperative PT consult. Check INR. Check 25-hydroxy vit D level to assess for any underlying pathological contribution towards the patient's fracture stemming from associated vitamin deficiency. Type and screen ordered.  Hold home baby aspirin.           #) Ground level mechanical fall: The patient reports a ground level mechanical fall earlier today in which she tripped without any associated loss of consciousness. Appears to be purely mechanical in nature, without clinical evidence at this time to suggest contributory dizziness, presyncope, syncope, or acute ischemic CVA. Does not appear to have hit head as component of this fall, and presentation does not appear to be associated any acute neurologic deficits.  While this fall appears to be purely mechanical in nature, will also check urinalysis to evaluate for any underlying infectious contribution. Cxr showed no acute progress.    Plan: Check urinalysis, as above.  CMP and CBC with differential in the morning. Fall precautions. Anticipate postoperative PT consult.            #) Leukocytosis: Presenting CBC reflects mildly elevated white cell count of 14,000. Suspect that this is reactive in nature in the setting of presenting ground-level mechanical fall as well as associated  acute hip fracture.  No evidence to suggest underlying infectious process at this time, including CXR performed in the ED today that showed no acute process.  Will check urinalysis to further evaluate for any underlying infectious contribution.  Plan: Check urinalysis, as above.  Repeat CBC with diff in the morning.  Monitor strict I's and O's.             #) Essential Hypertension: documented h/o such, with outpatient antihypertensive regimen including losartan.  SBP's in the ED today: Initially mildly elevated into the 180s, improving into the 140s following interval improvement in pain control.    Plan: Close monitoring of subsequent BP via routine VS. in setting of current n.p.o. status, hold home losartan for now.  As needed IV labetalol.               #) Type 2 Diabetes Mellitus: documented history of such. Home insulin regimen: None. Home oral hypoglycemic agents: Metformin.  Also on Trulicity at home presenting blood sugar: 165. Most recent A1c noted to be 6.0% when checked on 1123.   Plan: In the setting of current n.p.o. status, will pursue every 6 hour Accu-Cheks with low-dose sliding scale insulin.  hold home oral hypoglycemic agents during this hospitalization, for which timeframe will also hold home Trulicity.           #) Moderate persistent asthma, without clinical evidence of acute exacerbation at this time.  Outpatient respiratory regimen includes Flovent as well as prn albuterol inhaler.  Plan: Continue outpatient Flovent.  Prn albuterol nebulizer.  Add on serum magnesium level.           #) Anemia of chronic disease: Documented history of such, a/w with baseline hgb range 9 , with presenting hgb consistent with this range, in the absence of any overt evidence of active bleed.  Anticipation of orthopedic surgical procedure later today, type and screen has been ordered.    Plan: Repeat CBC in the morning.       DVT prophylaxis:  SCD's   Code Status: Full code Family Communication: none Disposition Plan: Per Rounding Team Consults called: EDP d/w on-call [facility], as above;  Admission status: inpatient    I SPENT GREATER THAN 75  MINUTES IN CLINICAL CARE TIME/MEDICAL DECISION-MAKING IN COMPLETING THIS ADMISSION.     [place] DO [facility] [place]   10/26/2022, 3:00 AM

## 2022-10-26 NOTE — Anesthesia Preprocedure Evaluation (Addendum)
Anesthesia Evaluation  Patient identified by MRN, date of birth, ID band Patient awake    Reviewed: Allergy & Precautions, NPO status , Patient's Chart, lab work & pertinent test results  Airway Mallampati: III  TM Distance: >3 FB Neck ROM: Full    Dental  (+) Dental Advisory Given, Edentulous Lower, Edentulous Upper   Pulmonary asthma    Pulmonary exam normal breath sounds clear to auscultation       Cardiovascular hypertension, Pt. on medications + Peripheral Vascular Disease  Normal cardiovascular exam Rhythm:Regular Rate:Normal  Echo 06/26/22: 1. Left ventricular ejection fraction, by estimation, is 65 to 70%. The  left ventricle has normal function. The left ventricle has no regional  wall motion abnormalities. There is mild asymmetric left ventricular  hypertrophy of the basal-septal segment.  Indeterminate diastolic filling due to E-A fusion.   2. Right ventricular systolic function is normal. The right ventricular  size is normal. Tricuspid regurgitation signal is inadequate for assessing  PA pressure.   3. The mitral valve is grossly normal. Trivial mitral valve  regurgitation. No evidence of mitral stenosis.   4. The aortic valve is grossly normal. Aortic valve regurgitation is not  visualized. No aortic stenosis is present.   5. The inferior vena cava is normal in size with greater than 50%  respiratory variability, suggesting right atrial pressure of 3 mmHg.      Neuro/Psych  PSYCHIATRIC DISORDERS Anxiety Depression    TIA Neuromuscular disease CVA    GI/Hepatic ,GERD  Medicated,,(+) Cirrhosis         Endo/Other  diabetes, Type 2, Oral Hypoglycemic Agents    Renal/GU Renal InsufficiencyRenal disease     Musculoskeletal  (+) Arthritis ,  LEFT FEMORAL NECK FX   Abdominal   Peds  Hematology  (+) Blood dyscrasia, anemia Plt 326k   Anesthesia Other Findings   Reproductive/Obstetrics                               Anesthesia Physical Anesthesia Plan  ASA: 3 and emergent  Anesthesia Plan: General   Post-op Pain Management: Ofirmev IV (intra-op)*   Induction: Intravenous  PONV Risk Score and Plan: 3 and Midazolam, Dexamethasone and Ondansetron  Airway Management Planned: Oral ETT  Additional Equipment:   Intra-op Plan:   Post-operative Plan: Extubation in OR  Informed Consent: I have reviewed the patients History and Physical, chart, labs and discussed the procedure including the risks, benefits and alternatives for the proposed anesthesia with the patient or authorized representative who has indicated his/her understanding and acceptance.     Dental advisory given  Plan Discussed with: CRNA  Anesthesia Plan Comments:          Anesthesia Quick Evaluation

## 2022-10-26 NOTE — Transfer of Care (Signed)
Immediate Anesthesia Transfer of Care Note  Patient: Carrie [name]  Procedure(s) Performed: TOTAL HIP ARTHROPLASTY ANTERIOR APPROACH (Left: Hip)  Patient Location: PACU  Anesthesia Type:General  Level of Consciousness: sedated, patient cooperative, and responds to stimulation  Airway & Oxygen Therapy: Patient Spontanous Breathing and Patient connected to face mask oxygen  Post-op Assessment: Report given to RN and Post -op Vital signs reviewed and stable  Post vital signs: Reviewed and stable  Last Vitals:  Vitals Value Taken Time  BP 177/80 10/26/22 1102  Temp    Pulse 98 10/26/22 1105  Resp 18 10/26/22 1105  SpO2 100 % 10/26/22 1105  Vitals shown include unvalidated device data.  Last Pain:  Vitals:   10/26/22 0838  TempSrc:   PainSc: 10-Worst pain ever      Patients Stated Pain Goal: 5 (12/13/29 4388)  Complications: No notable events documented.

## 2022-10-26 NOTE — Op Note (Signed)
PATIENT ID:      Carrie [name]  MRN:     [id number] DOB/AGE:    06-13-1956 / 67 y.o.       OPERATIVE REPORT    DATE OF PROCEDURE:  10/26/2022       PREOPERATIVE DIAGNOSIS:  LEFT FEMORAL NECK FX                                                       Estimated body mass index is 27.34 kg/m as calculated from the following:   Height as of this encounter: 5' (1.524 m).   Weight as of this encounter: 63.5 kg.     POSTOPERATIVE DIAGNOSIS:  Anterior Approach Hip repair                                                           PROCEDURE:  1. left total hip arthroplasty using a 50 mm DePuy Pinnacle  Cup, [employer],  neutral liner, a +5 32 mm ceramic head,  and a #4 high offset Actis stem, 2.interpretation of multiple intraoperative fluoroscopic images   SURGEON: Alta Corning    ASSISTANT:   Gaspar Skeeters PA-C  (present throughout entire procedure and necessary for timely completion of the procedure)  ANESTHESIA: general BLOOD LOSS: 450CC Tranexamic Acid: 1 gram IV DRAINS: None COMPLICATIONS: None    NDICATIONS FOR PROCEDURE: Patient is otherwise healthy and fell last evening suffering femoral neck fracture.  Because of her high demand status I felt that total hip replacement be the appropriate course of action.  We discussed that. Patient desires elective left total hip arthroplasty as best alternative to give her best significant function.  The risks, benefits, and alternatives were discussed at length including but not limited to the risks of infection, bleeding, nerve injury, stiffness, blood clots, the need for revision surgery, cardiopulmonary complications, among others, and they were willing to proceed.Benefits have been discussed. Questions answered.     PROCEDURE IN DETAIL: The patient was identified by armband,  received preoperative IV antibiotics in the holding area at [facility], taken to the operating room , appropriate anesthetic monitors  were  attached and spinal anesthesia was induced.  The patient was placed onto the hot bed and all bony prominences were well-padded.The left hip was prepped and draped for an anterior approach to the hip.  An incision was made and the subcutaneous dissection was down to the level of the tensor fascia.  The fascia was opened and finger dissected.  The bleeders coming across the anterior portion of the hip were identified and cauterized. Retractors were put in place above and below the femoral neck.  The capsule was opened and tagged and a provisional neck cut was made.  The head was removed and sized on the back table.  The acetabulum was sequentially reamed to a level of 49 mm and a 50 mm porous-coated pinnacle cup was hammered into place with 45 of lateral opening and 30 of anteversion.fluoroscopy was used to ensure this position of the cup.  Attention was turned towards the femur where the leg was actually  rotated, extended, and adduction did.  The femur was sequentially broached until a size of 4 broach gave a perfect fit and fill.at this point a  1.5 mm delta ceramic hip ball was placed and the hip reduced.  At that point I felt that she would be better suited to high offset stem and fluoroscopic images were taken to assess the leg length, fit and fill of the stem, and cup position.  We were happy with the construct at this point.  The 4 broach was removed and a final Actis stem with high offset  and a 1.5 mm ceramic hip ball was placed and reduced.  Final images were taken to make certain there were happy with the position at this point.   The capsule was closed with #1 Vicryl suture.  The tensor fascia was closed with 0 Vicryl suture.  The skin was then closed with combination of 0 and 2-0 Vicryl suture.  The top layer was with 3-0 Monocryl suture.  Benzoin and Steri-Strips were applied  and a sterile compressive dressing was applied and the patient taken to recovery room she noted be in satisfactory  condition.  Past medical Motion for the procedure was approximately 300 cc.  Of note Gaspar Skeeters was with the entire case and assisted by retraction of tissues, manipulation of the leg, and closing the minimize or time.     Alta Corning 10/26/2022, 10:48 AM

## 2022-10-26 NOTE — ED Triage Notes (Signed)
Patient BIB [facility].Patient was taking her trash out and lost her footing and fell. Complain of pain to left hip- left hip/leg deformity per EMS. Gicen 158mg Fentanyl Intranasal en route to ER. JRPRN

## 2022-10-26 NOTE — Progress Notes (Signed)
Brief same day note:  Patient is a 67 year old female with history of diabetes type 2, hypertension, moderate persistent asthma, anemia of chronic disease who presented from home with complaints of left hip pain after a fall.  On condition she was hemodynamically stable.  X-ray of the left hip/pelvis showed acute fracture of the proximal left femur.  Orthopedics consulted.  Plan for ORIF today.   Assessment/ plan:  Acute left femoral neck fracture: Continue pain management, supportive care.  Orthopedics already following and plan for ORIF.  PT/OT evaluation after surgery.  Might need to go to SNF  Leukocytosis: WBC of 14 K on presentation.  Likely reactive.  Chest x-ray did not show any acute process.  UA not suspicious for UTI.  Continue to monitor  Hypertension: Takes losartan at home.  Continue monitoring  Diabetes type 2: Takes metformin at home.  Also on Trulicity.  Recent hemoglobin A1c of 6.  Continue sliding scale insulin.  Moderate persistent asthma: Continue bronchodilators as needed  Anemia of chronic disease: Baseline hemoglobin of 9.  Expect decrease in the hemoglobin after surgery.  Continue to monitor

## 2022-10-26 NOTE — Discharge Instructions (Signed)

## 2022-10-26 NOTE — Anesthesia Postprocedure Evaluation (Signed)
Anesthesia Post Note  Patient: Carrie [name]  Procedure(s) Performed: TOTAL HIP ARTHROPLASTY ANTERIOR APPROACH (Left: Hip)     Patient location during evaluation: PACU Anesthesia Type: General Level of consciousness: awake and alert Pain management: pain level controlled Vital Signs Assessment: post-procedure vital signs reviewed and stable Respiratory status: spontaneous breathing, nonlabored ventilation, respiratory function stable and patient connected to nasal cannula oxygen Cardiovascular status: blood pressure returned to baseline and stable Postop Assessment: no apparent nausea or vomiting Anesthetic complications: no   No notable events documented.  Last Vitals:  Vitals:   10/26/22 1300 10/26/22 1317  BP: 123/66 111/65  Pulse: 80 90  Resp: 16 18  Temp:    SpO2: 100% 94%    Last Pain:  Vitals:   10/26/22 1412  TempSrc:   PainSc: 0-No pain                 Santa Lighter

## 2022-10-26 NOTE — ED Notes (Signed)
Dr. Pearline Cables (orthopedic surgeon) called to find out whether the patient has been cleared by the hospitalist before he can take patient in for surgery this morning. I read the notes by Dr. Tawanna Solo to him, and placed him on hold to find more.  Jessica from short stay stated that they are unable to take patient to short stay until hospitalist clears her for surgery. I will page hospitalist.

## 2022-10-27 DIAGNOSIS — S72002A Fracture of unspecified part of neck of left femur, initial encounter for closed fracture: Secondary | ICD-10-CM | POA: Diagnosis not present

## 2022-10-27 LAB — CBC
HCT: 22.9 % — ABNORMAL LOW (ref 36.0–46.0)
HCT: 25.5 % — ABNORMAL LOW (ref 36.0–46.0)
Hemoglobin: 6.8 g/dL — CL (ref 12.0–15.0)
Hemoglobin: 7.9 g/dL — ABNORMAL LOW (ref 12.0–15.0)
MCH: 21.9 pg — ABNORMAL LOW (ref 26.0–34.0)
MCH: 23.7 pg — ABNORMAL LOW (ref 26.0–34.0)
MCHC: 29.7 g/dL — ABNORMAL LOW (ref 30.0–36.0)
MCHC: 31 g/dL (ref 30.0–36.0)
MCV: 73.6 fL — ABNORMAL LOW (ref 80.0–100.0)
MCV: 76.3 fL — ABNORMAL LOW (ref 80.0–100.0)
Platelets: 238 10*3/uL (ref 150–400)
Platelets: 217 10*3/uL (ref 150–400)
RBC: 3.11 MIL/uL — ABNORMAL LOW (ref 3.87–5.11)
RBC: 3.34 MIL/uL — ABNORMAL LOW (ref 3.87–5.11)
RDW: 17.2 % — ABNORMAL HIGH (ref 11.5–15.5)
RDW: 18.6 % — ABNORMAL HIGH (ref 11.5–15.5)
WBC: 10.6 10*3/uL — ABNORMAL HIGH (ref 4.0–10.5)
WBC: 10.2 10*3/uL (ref 4.0–10.5)
nRBC: 0 % (ref 0.0–0.2)
nRBC: 0 % (ref 0.0–0.2)

## 2022-10-27 LAB — BASIC METABOLIC PANEL
Anion gap: 8 (ref 5–15)
BUN: 38 mg/dL — ABNORMAL HIGH (ref 8–23)
CO2: 24 mmol/L (ref 22–32)
Calcium: 7.9 mg/dL — ABNORMAL LOW (ref 8.9–10.3)
Chloride: 102 mmol/L (ref 98–111)
Creatinine, Ser: 1.4 mg/dL — ABNORMAL HIGH (ref 0.44–1.00)
GFR, Estimated: 42 mL/min — ABNORMAL LOW (ref >60)
Glucose, Bld: 171 mg/dL — ABNORMAL HIGH (ref 70–99)
Potassium: 5 mmol/L (ref 3.5–5.1)
Sodium: 134 mmol/L — ABNORMAL LOW (ref 135–145)

## 2022-10-27 LAB — PREPARE RBC (CROSSMATCH)

## 2022-10-27 LAB — GLUCOSE, CAPILLARY
Glucose-Capillary: 181 mg/dL — ABNORMAL HIGH (ref 70–99)
Glucose-Capillary: 218 mg/dL — ABNORMAL HIGH (ref 70–99)
Glucose-Capillary: 171 mg/dL — ABNORMAL HIGH (ref 70–99)

## 2022-10-27 MED ORDER — METHOCARBAMOL 1000 MG/10ML IJ SOLN: 500.0000 mg | Freq: Four times a day (QID) | INTRAVENOUS | Status: DC | PRN

## 2022-10-27 MED ORDER — HYDROCODONE-ACETAMINOPHEN 5-325 MG PO TABS
1.0000 | ORAL_TABLET | ORAL | Status: DC | PRN
  Administered 2022-10-27 – 2022-10-28 (×3): 1 via ORAL
  Filled 2022-10-27 (×3): qty 1

## 2022-10-27 MED ORDER — BUDESONIDE 0.25 MG/2ML IN SUSP
0.2500 mg | Freq: Two times a day (BID) | RESPIRATORY_TRACT | Status: DC
  Administered 2022-10-27 (×2): 0.25 mg via RESPIRATORY_TRACT
  Filled 2022-10-27 (×2): qty 2

## 2022-10-27 MED ORDER — TRANEXAMIC ACID-NACL 1000-0.7 MG/100ML-% IV SOLN: 1000.0000 mg | Freq: Once | INTRAVENOUS | Status: DC

## 2022-10-27 MED ORDER — ONDANSETRON HCL 4 MG/2ML IJ SOLN: 4.0000 mg | Freq: Four times a day (QID) | INTRAMUSCULAR | Status: DC | PRN

## 2022-10-27 MED ORDER — SODIUM CHLORIDE 0.9 % IV SOLN: INTRAVENOUS | Status: DC

## 2022-10-27 MED ORDER — CEFAZOLIN SODIUM-DEXTROSE 2-4 GM/100ML-% IV SOLN
2.0000 g | Freq: Four times a day (QID) | INTRAVENOUS | Status: AC
  Administered 2022-10-27 (×2): 2 g via INTRAVENOUS
  Filled 2022-10-27 (×2): qty 100

## 2022-10-27 MED ORDER — SODIUM CHLORIDE 0.9% IV SOLUTION: Freq: Once | INTRAVENOUS | Status: AC

## 2022-10-27 MED ORDER — TRAMADOL HCL 50 MG PO TABS
50.0000 mg | ORAL_TABLET | Freq: Four times a day (QID) | ORAL | Status: AC
  Administered 2022-10-27 – 2022-10-28 (×3): 50 mg via ORAL
  Filled 2022-10-27 (×3): qty 1

## 2022-10-27 MED ORDER — METHOCARBAMOL 500 MG PO TABS
500.0000 mg | ORAL_TABLET | Freq: Four times a day (QID) | ORAL | Status: DC | PRN
  Administered 2022-10-28 – 2022-10-29 (×3): 500 mg via ORAL
  Filled 2022-10-27 (×3): qty 1

## 2022-10-27 MED ORDER — ONDANSETRON HCL 4 MG PO TABS: 4.0000 mg | ORAL_TABLET | Freq: Four times a day (QID) | ORAL | Status: DC | PRN

## 2022-10-27 NOTE — Progress Notes (Signed)
Subjective: 1 Day Post-Op Procedure(s) (LRB): TOTAL HIP ARTHROPLASTY ANTERIOR APPROACH (Left) Patient reports pain as mild.  The patient sat up in the chair yesterday.  She denies dizziness or shortness of breath today.  She did drop her hemoglobin postoperatively and was given 1 unit of packed RBCs.  Objective: Vital signs in last 24 hours: Temp:  [97.5 F (36.4 C)-100.9 F (38.3 C)] 98.4 F (36.9 C) (01/13 0843) Pulse Rate:  [80-112] 98 (01/13 0843) Resp:  [15-25] 20 (01/13 0843) BP: (111-177)/(53-80) 124/75 (01/13 0843) SpO2:  [92 %-100 %] 100 % (01/13 0843)  Intake/Output from previous day: 01/12 0701 - 01/13 0700 In: 2637.5 [I.V.:2637.5] Out: 1850 [Urine:1400; Blood:450] Intake/Output this shift: No intake/output data recorded.  Recent Labs    10/26/22 0100 10/26/22 0446 10/26/22 1221 10/27/22 0357  HGB 9.8* 8.7* 7.2* 6.8*   Recent Labs    10/26/22 0446 10/26/22 1221 10/27/22 0357  WBC 17.0*  --  10.6*  RBC 3.96  --  3.11*  HCT 28.5* 23.9* 22.9*  PLT 326  --  238   Recent Labs    10/26/22 0446 10/27/22 0357  NA 135 134*  K 3.7 5.0  CL 105 102  CO2 21* 24  BUN 43* 38*  CREATININE 0.63 1.40*  GLUCOSE 146* 171*  CALCIUM 8.6* 7.9*   Recent Labs    10/26/22 0446  INR 1.3*   Left hip exam: Neurovascular intact Sensation intact distally Intact pulses distally Dorsiflexion/Plantar flexion intact Incision: dressing C/D/I Compartment soft   Assessment/Plan: 1 Day Post-Op Procedure(s) (LRB): TOTAL HIP ARTHROPLASTY ANTERIOR APPROACH (Left) Symptomatic postop blood loss anemia. Plan: Up with therapy as tolerated on the left lower extremity without hip precautions. Continue aspirin 81 mg twice daily for DVT prophylaxis along with SCDs and mobility. Ferrous sulfate 325 mg daily. Check CBC in AM. DC Foley this a.m. Anticipate discharge home with home health PT tomorrow.   Carrie [name] 10/27/2022, 8:50 AM

## 2022-10-27 NOTE — Progress Notes (Signed)
Physical Therapy Treatment Patient Details Name: Carrie [name] MRN: [id number] DOB: 1955-11-16 Today's Date: 10/27/2022   History of Present Illness 67 year old female with history of diabetes type 2, hypertension, moderate persistent asthma, anemia of chronic disease, CVA, Bil TKAs who presented from home with complaints of left hip pain after a fall.  Pt sustained left femoral neck fx and s/p left total hip arthroplasty on 10/26/22.    PT Comments    Pt's RN provided pain meds beginning of session, and pt eager to ambulate.  Pt only able to tolerate 20 feet and then became very hot and perspiring so recliner provided.  Vitals below.  Pt's lunch also arrived so returned to room in recliner and set up pt for lunch.  10/27/22 1219  Vital Signs  Temp 98.2 F (36.8 C)  Temp Source Oral  Pulse Rate 95  Pulse Rate Source Monitor  Resp 20  BP 130/63  BP Location Left Arm  BP Method Automatic  Patient Position (if appropriate) Sitting  Oxygen Therapy  SpO2 95 %  O2 Device Room Air     Recommendations for follow up therapy are one component of a multi-disciplinary discharge planning process, led by the attending physician.  Recommendations may be updated based on patient status, additional functional criteria and insurance authorization.  Follow Up Recommendations  Outpatient PT     Assistance Recommended at Discharge PRN  Patient can return home with the following Help with stairs or ramp for entrance;Assistance with cooking/housework;Assist for transportation   Equipment Recommendations  None recommended by PT    Recommendations for Other Services       Precautions / Restrictions Precautions Precautions: None;Fall Restrictions Weight Bearing Restrictions: No Other Position/Activity Restrictions: WBAT     Mobility  Bed Mobility               General bed mobility comments: pt in recliner on arrival    Transfers Overall transfer level: Needs assistance Equipment  used: Rolling walker (2 wheels) Transfers: Sit to/from Stand Sit to Stand: Min guard           General transfer comment: verbal cues for UE and LE positioning    Ambulation/Gait Ambulation/Gait assistance: Min assist, Min guard Gait Distance (Feet): 20 Feet Assistive device: Rolling walker (2 wheels) Gait Pattern/deviations: Step-to pattern, Decreased stance time - left, Antalgic       General Gait Details: verbal cues for sequence, RW positioning, step length; distance limited by pt perspiration (denied dizziness) and request for chair; recliner provided; pt feeling better within a couple minutes of sitting   Stairs             Wheelchair Mobility    Modified Rankin (Stroke Patients Only)       Balance                                            Cognition Arousal/Alertness: Awake/alert Behavior During Therapy: WFL for tasks assessed/performed Overall Cognitive Status: Within Functional Limits for tasks assessed                                          Exercises      General Comments        Pertinent Vitals/Pain Pain Assessment Pain Assessment: 0-10 Pain  Score: 8  Pain Location: left hip with weight bearing Pain Descriptors / Indicators: Sore, Operative site guarding Pain Intervention(s): Patient requesting pain meds-RN notified, Repositioned, Monitored during session    Home Living                          Prior Function            PT Goals (current goals can now be found in the care plan section) Progress towards PT goals: Progressing toward goals    Frequency    Min 5X/week      PT Plan Current plan remains appropriate    Co-evaluation              AM-PAC PT "6 Clicks" Mobility   Outcome Measure  Help needed turning from your back to your side while in a flat bed without using bedrails?: A Little Help needed moving from lying on your back to sitting on the side of a flat bed  without using bedrails?: A Little Help needed moving to and from a bed to a chair (including a wheelchair)?: A Little Help needed standing up from a chair using your arms (e.g., wheelchair or bedside chair)?: A Little Help needed to walk in hospital room?: A Little Help needed climbing 3-5 steps with a railing? : A Lot 6 Click Score: 17    End of Session Equipment Utilized During Treatment: Gait belt Activity Tolerance: Patient tolerated treatment well;Patient limited by pain Patient left: in chair;with call bell/phone within reach;with chair alarm set   PT Visit Diagnosis: Difficulty in walking, not elsewhere classified (R26.2)     Time: 9604-5409 PT Time Calculation (min) (ACUTE ONLY): 22 min  Charges:  $Gait Training: 8-22 mins                     Carrie [name], DPT Physical Therapist Acute Rehabilitation Services Preferred contact method: Secure Chat Weekend Pager Only: [phone] Office: [phone]    Carrie [name] Carrie [name] 10/27/2022, 1:43 PM

## 2022-10-27 NOTE — Progress Notes (Signed)
Physical Therapy Treatment Patient Details Name: Carrie [name] MRN: [id number] DOB: 10-Jan-1956 Today's Date: 10/27/2022   History of Present Illness 67 year old female with history of diabetes type 2, hypertension, moderate persistent asthma, anemia of chronic disease, CVA, Bil TKAs who presented from home with complaints of left hip pain after a fall.  Pt sustained left femoral neck fx and s/p left total hip arthroplasty on 10/26/22.    PT Comments    Checked on pt around 2pm and she requested for PT to come back as she had just ambulated to/from bathroom.  Pt requested 7616 return time.  Therapist able to return at 1530, and pt agreeable to mobilize.  Pt however was unable to tolerate ambulating due to increased pain.  Pt notified she may need to discuss pain meds with RN as current regimen is limiting her mobility, and she would like to d/c home.  Pt states her family will return to work on Monday, so she will be alone during the day.  Pt may need HHPT if she does not have transportation and mobility does not improve. Pt hopeful mobility improves tomorrow.  Pt assisted back to bed per her request and repositioned to comfort.    Recommendations for follow up therapy are one component of a multi-disciplinary discharge planning process, led by the attending physician.  Recommendations may be updated based on patient status, additional functional criteria and insurance authorization.  Follow Up Recommendations  Outpatient PT vs HHPT pending transportation and mobility     Assistance Recommended at Discharge PRN  Patient can return home with the following Help with stairs or ramp for entrance;Assistance with cooking/housework;Assist for transportation   Equipment Recommendations  None recommended by PT    Recommendations for Other Services       Precautions / Restrictions Precautions Precautions: None;Fall Restrictions Other Position/Activity Restrictions: WBAT     Mobility  Bed  Mobility Overal bed mobility: Needs Assistance Bed Mobility: Sit to Supine       Sit to supine: Mod assist   General bed mobility comments: assist for LEs due to pain    Transfers Overall transfer level: Needs assistance Equipment used: Rolling walker (2 wheels) Transfers: Sit to/from Stand Sit to Stand: Min guard   Step pivot transfers: Min assist       General transfer comment: verbal cues for UE and LE positioning, pt unable to step forward with Lt LE after multiple attempts, pt felt unable to ambulate despite trying and requested return to bed; cues for stepping back with "good" LE first, pt required a few steps backwards to reach bed    Ambulation/Gait           Stairs             Wheelchair Mobility    Modified Rankin (Stroke Patients Only)       Balance                                            Cognition Arousal/Alertness: Awake/alert Behavior During Therapy: WFL for tasks assessed/performed Overall Cognitive Status: Within Functional Limits for tasks assessed                                          Exercises      General  Comments        Pertinent Vitals/Pain Pain Assessment Pain Assessment: 0-10 Pain Score: 10-Worst pain ever Pain Location: left hip with weight bearing Pain Descriptors / Indicators: Sore, Operative site guarding Pain Intervention(s): Repositioned, Monitored during session, Patient requesting pain meds-RN notified    Home Living                          Prior Function            PT Goals (current goals can now be found in the care plan section) Progress towards PT goals: Progressing toward goals    Frequency    Min 5X/week      PT Plan Current plan remains appropriate    Co-evaluation              AM-PAC PT "6 Clicks" Mobility   Outcome Measure  Help needed turning from your back to your side while in a flat bed without using bedrails?: A  Little Help needed moving from lying on your back to sitting on the side of a flat bed without using bedrails?: A Little Help needed moving to and from a bed to a chair (including a wheelchair)?: A Little Help needed standing up from a chair using your arms (e.g., wheelchair or bedside chair)?: A Little Help needed to walk in hospital room?: A Little Help needed climbing 3-5 steps with a railing? : A Lot 6 Click Score: 17    End of Session Equipment Utilized During Treatment: Gait belt Activity Tolerance: Patient limited by pain Patient left: in bed;with call bell/phone within reach;with bed alarm set Nurse Communication: Mobility status;Patient requests pain meds PT Visit Diagnosis: Difficulty in walking, not elsewhere classified (R26.2)     Time: 5974-1638 PT Time Calculation (min) (ACUTE ONLY): 19 min  Charges:   $Therapeutic Activity: 8-22 mins                     Jannette Spanner PT, DPT Physical Therapist Acute Rehabilitation Services Preferred contact method: Secure Chat Weekend Pager Only: [phone] Office: [place] 10/27/2022, 4:00 PM

## 2022-10-27 NOTE — Progress Notes (Signed)
PROGRESS NOTE  Carrie [name]  KDX:[id number] DOB: 04-Sep-1956 DOA: 10/26/2022 PCP: Bartholome Bill, MD   Brief Narrative:  Patient is a 67 year old female with history of diabetes type 2, hypertension, moderate persistent asthma, anemia of chronic disease who presented from home with complaints of left hip pain after a fall. On condition she was hemodynamically stable. X-ray of the left hip/pelvis showed acute fracture of the proximal left femur. Orthopedics consulted.  Underwent left total hip arthroplasty on 1/12.  PT recommending outpatient follow-up.  Plan for discharge tomorrow.  Hospital course remarkable for acute blood loss anemia.  Assessment & Plan:  Principal Problem:   Closed fracture of neck of left femur (HCC) Active Problems:   DM2 (diabetes mellitus, type 2) (HCC)   Essential hypertension   Leukocytosis   Fall at home, initial encounter   Moderate persistent asthma   Anemia of chronic disease  Acute left femoral neck fracture: Continue pain management, supportive care.  Status post left hip total arthroplasty.  PT recommending outpatient follow-up.  Started on aspirin for DVT prophylaxis.  Continue pain management, supportive care, bowel regimen   Leukocytosis: WBC of 14 K on presentation.  Likely reactive.  Chest x-ray did not show any acute process.  UA not suspicious for UTI.  Continue to monitor.  Already improved.   Hypertension: Takes losartan at home.  Continue monitoring.  Blood pressure stable.   Diabetes type 2: Takes metformin at home.  Also on Trulicity.  Recent hemoglobin A1c of 6.  Continue sliding scale insulin.  Will monitor her sugars   Moderate persistent asthma: Continue bronchodilators as needed   Acute on chronic anemia: Baseline hemoglobin of 9.  Hemoglobin up to the range of 6 today.  Given a unit of blood transfusion.  Check CBC tomorrow  Mild AKI: Started on gentle IV fluids.  Check BMP tomorrow        DVT prophylaxis:SCDs Start:  10/26/22 1324 SCDs Start: 10/26/22 0255     Code Status: Full Code  Family Communication: None at bedside  Patient status: Inpatient  Patient is from : Home  Anticipated discharge to: Home  Estimated DC date: Tomorrow   Consultants: Orthopedics  Procedures: ORIF  Antimicrobials:  Anti-infectives (From admission, onward)    Start     Dose/Rate Route Frequency Ordered Stop   10/27/22 1000  ceFAZolin (ANCEF) IVPB 2g/100 mL premix        2 g 200 mL/hr over 30 Minutes Intravenous Every 6 hours 10/27/22 0851 10/27/22 2159   10/26/22 0815  ceFAZolin (ANCEF) IVPB 2g/100 mL premix        2 g 200 mL/hr over 30 Minutes Intravenous On call to O.R. 10/26/22 0810 10/26/22 0923   10/26/22 0729  ceFAZolin (ANCEF) 2-4 GM/100ML-% IVPB       Note to Pharmacy: Jasmine Pang K: cabinet override      10/26/22 0729 10/26/22 0923       Subjective: Patient seen and examined at bedside today.  Hemodynamically stable.  Lying on the chair.  Comfortable.  Denies any significant pain on the left left hip.  Eager  to go home.  Objective: Vitals:   10/27/22 0537 10/27/22 0608 10/27/22 0841 10/27/22 0843  BP: 121/61 (!) 121/53  124/75  Pulse: 98 95  98  Resp: (!) 25 (!) 24  20  Temp: 98.8 F (37.1 C) 98.6 F (37 C)  98.4 F (36.9 C)  TempSrc: Oral Oral  Oral  SpO2: 98% 100% 100% 100%  Weight:      Height:        Intake/Output Summary (Last 24 hours) at 10/27/2022 1119 Last data filed at 10/27/2022 1100 Gross per 24 hour  Intake 1812.28 ml  Output 2600 ml  Net -787.72 ml   Filed Weights   10/26/22 0838  Weight: 63.5 kg    Examination:  General exam: Overall comfortable, not in distress HEENT: PERRL Respiratory system:  no wheezes or crackles  Cardiovascular system: S1 & S2 heard, RRR.  Gastrointestinal system: Abdomen is nondistended, soft and nontender. Central nervous system: Alert and oriented Extremities: No edema, no clubbing ,no cyanosis,tenderness on the left  hip Skin: No rashes, no ulcers,no icterus     Data Reviewed: I have personally reviewed following labs and imaging studies  CBC: Recent Labs  Lab 10/26/22 0100 10/26/22 0446 10/26/22 1221 10/27/22 0357  WBC 14.1* 17.0*  --  10.6*  NEUTROABS 9.5* 11.7*  --   --   HGB 9.8* 8.7* 7.2* 6.8*  HCT 32.5* 28.5* 23.9* 22.9*  MCV 72.7* 72.0*  --  73.6*  PLT 379 326  --  644   Basic Metabolic Panel: Recent Labs  Lab 10/26/22 0100 10/26/22 0446 10/27/22 0357  NA 132* 135 134*  K 3.9 3.7 5.0  CL 101 105 102  CO2 20* 21* 24  GLUCOSE 165* 146* 171*  BUN 38* 43* 38*  CREATININE 1.18* 0.63 1.40*  CALCIUM 9.1 8.6* 7.9*  MG  --  1.9  --      Recent Results (from the past 240 hour(s))  Surgical pcr screen     Status: None   Collection Time: 10/26/22  8:47 AM   Specimen: Nasal Mucosa; Nasal Swab  Result Value Ref Range Status   MRSA, PCR NEGATIVE NEGATIVE Final   Staphylococcus aureus NEGATIVE NEGATIVE Final    Comment: (NOTE) The Xpert SA Assay (FDA approved for NASAL specimens in patients 39 years of age and older), is one component of a comprehensive surveillance program. It is not intended to diagnose infection nor to guide or monitor treatment. Performed at [facility], [place] [address] [place], [place] [address]      Radiology Studies: DG HIP UNILAT WITH PELVIS 1V LEFT  Result Date: 10/26/2022 CLINICAL DATA:  67 year old female with left femoral neck fracture, undergoing left hip surgery. EXAM: DG HIP (WITH OR WITHOUT PELVIS) 1V*L* COMPARISON:  Left hip series 0137 hours today. FINDINGS: Two intraoperative AP fluoroscopic spot views at 1056 hours. Left bipolar hip arthroplasty now in place. Hardware appears intact, with normal AP alignment. No new osseous abnormality identified. Fluoroscopy time: 0 minutes 19 seconds.  1.86 mGy. IMPRESSION: New left hip arthroplasty with no adverse features. Electronically Signed   By: Genevie Ann M.D.   On: 10/26/2022 10:43    DG C-Arm 1-60 Min-No Report  Result Date: 10/26/2022 Fluoroscopy was utilized by the requesting physician.  No radiographic interpretation.   DG C-Arm 1-60 Min-No Report  Result Date: 10/26/2022 Fluoroscopy was utilized by the requesting physician.  No radiographic interpretation.   DG HIP UNILAT WITH PELVIS 2-3 VIEWS LEFT  Result Date: 10/26/2022 CLINICAL DATA:  Status post fall with left hip pain. EXAM: DG HIP (WITH OR WITHOUT PELVIS) 2-3V LEFT COMPARISON:  None Available. FINDINGS: An acute fracture deformity is seen extending through the neck of the proximal left femur. There is no evidence of dislocation. Soft tissue structures are unremarkable. IMPRESSION: Acute fracture of the proximal left femur. Electronically Signed   By:  Virgina Norfolk M.D.   On: 10/26/2022 01:58   DG Chest 1 View  Result Date: 10/26/2022 CLINICAL DATA:  Status post fall. EXAM: CHEST  1 VIEW COMPARISON:  September 30, 2022. FINDINGS: The heart size and mediastinal contours are within normal limits. Both lungs are clear. Evidence of prior vertebroplasty is seen within the upper lumbar spine. IMPRESSION: No active cardiopulmonary disease. Electronically Signed   By: Virgina Norfolk M.D.   On: 10/26/2022 01:57    Scheduled Meds:  aspirin  81 mg Oral BID   budesonide (PULMICORT) nebulizer solution  0.25 mg Nebulization BID   busPIRone  10 mg Oral QHS   docusate sodium  100 mg Oral BID   DULoxetine  30 mg Oral Daily   ferrous sulfate  325 mg Oral BID WC   insulin aspart  0-6 Units Subcutaneous Q6H   metFORMIN  500 mg Oral Daily   pantoprazole  40 mg Oral Daily   primidone  250 mg Oral QHS   traMADol  50 mg Oral Q6H   Continuous Infusions:  sodium chloride 75 mL/hr at 10/27/22 0747    ceFAZolin (ANCEF) IV 2 g (10/27/22 1046)   methocarbamol (ROBAXIN) IV       LOS: 1 day   Shelly Coss, MD [facility] P1/13/2024, 11:19 AM

## 2022-10-28 DIAGNOSIS — S72002A Fracture of unspecified part of neck of left femur, initial encounter for closed fracture: Secondary | ICD-10-CM | POA: Diagnosis not present

## 2022-10-28 LAB — GLUCOSE, CAPILLARY
Glucose-Capillary: 116 mg/dL — ABNORMAL HIGH (ref 70–99)
Glucose-Capillary: 140 mg/dL — ABNORMAL HIGH (ref 70–99)
Glucose-Capillary: 171 mg/dL — ABNORMAL HIGH (ref 70–99)
Glucose-Capillary: 180 mg/dL — ABNORMAL HIGH (ref 70–99)

## 2022-10-28 LAB — CBC
HCT: 29 % — ABNORMAL LOW (ref 36.0–46.0)
Hemoglobin: 8.9 g/dL — ABNORMAL LOW (ref 12.0–15.0)
MCH: 23.7 pg — ABNORMAL LOW (ref 26.0–34.0)
MCHC: 30.7 g/dL (ref 30.0–36.0)
MCV: 77.1 fL — ABNORMAL LOW (ref 80.0–100.0)
Platelets: 232 10*3/uL (ref 150–400)
RBC: 3.76 MIL/uL — ABNORMAL LOW (ref 3.87–5.11)
RDW: 19.4 % — ABNORMAL HIGH (ref 11.5–15.5)
WBC: 13.5 10*3/uL — ABNORMAL HIGH (ref 4.0–10.5)
nRBC: 0 % (ref 0.0–0.2)

## 2022-10-28 LAB — TYPE AND SCREEN
ABO/RH(D): A POS
Antibody Screen: NEGATIVE
Unit division: 0

## 2022-10-28 LAB — BASIC METABOLIC PANEL
Anion gap: 10 (ref 5–15)
BUN: 22 mg/dL (ref 8–23)
CO2: 20 mmol/L — ABNORMAL LOW (ref 22–32)
Calcium: 7.9 mg/dL — ABNORMAL LOW (ref 8.9–10.3)
Chloride: 102 mmol/L (ref 98–111)
Creatinine, Ser: 1.17 mg/dL — ABNORMAL HIGH (ref 0.44–1.00)
GFR, Estimated: 52 mL/min — ABNORMAL LOW (ref >60)
Glucose, Bld: 133 mg/dL — ABNORMAL HIGH (ref 70–99)
Potassium: 4.5 mmol/L (ref 3.5–5.1)
Sodium: 132 mmol/L — ABNORMAL LOW (ref 135–145)

## 2022-10-28 LAB — BPAM RBC
Blood Product Expiration Date: 202402092359
ISSUE DATE / TIME: 202401130545
Unit Type and Rh: 6200

## 2022-10-28 MED ORDER — SENNOSIDES-DOCUSATE SODIUM 8.6-50 MG PO TABS
1.0000 | ORAL_TABLET | Freq: Two times a day (BID) | ORAL | Status: DC
  Administered 2022-10-28 – 2022-10-29 (×3): 1 via ORAL
  Filled 2022-10-28 (×3): qty 1

## 2022-10-28 MED ORDER — POLYETHYLENE GLYCOL 3350 17 G PO PACK
17.0000 g | PACK | Freq: Every day | ORAL | Status: DC
  Administered 2022-10-28 – 2022-10-29 (×2): 17 g via ORAL
  Filled 2022-10-28 (×2): qty 1

## 2022-10-28 MED ORDER — BISACODYL 10 MG RE SUPP
10.0000 mg | Freq: Once | RECTAL | Status: AC
  Administered 2022-10-28: 10 mg via RECTAL
  Filled 2022-10-28: qty 1

## 2022-10-28 MED ORDER — OXYCODONE-ACETAMINOPHEN 5-325 MG PO TABS
1.0000 | ORAL_TABLET | ORAL | Status: DC | PRN
  Administered 2022-10-29: 1 via ORAL
  Filled 2022-10-28: qty 1

## 2022-10-28 MED ORDER — CELECOXIB 200 MG PO CAPS
200.0000 mg | ORAL_CAPSULE | Freq: Two times a day (BID) | ORAL | Status: DC
  Administered 2022-10-28 – 2022-10-29 (×3): 200 mg via ORAL
  Filled 2022-10-28 (×4): qty 1

## 2022-10-28 NOTE — Progress Notes (Signed)
Physical Therapy Treatment Patient Details Name: Carrie [name] MRN: [id number] DOB: [birth year] Today's Date: 10/28/2022   History of Present Illness 67 year old female with history of diabetes type 2, hypertension, moderate persistent asthma, anemia of chronic disease, CVA, Bil TKAs who presented from home with complaints of left hip pain after a fall.  Pt sustained left femoral neck fx and s/p left total hip arthroplasty on 10/26/22.    PT Comments    Pt reports pain improved with ambulation today (5/10 vs 8/10 earlier) and able to tolerate 40 feet.  Pt also able to use BSC with min/guard/ set up supervision before returning to recliner and performing exercises.  Pt will need to be able to perform steps prior to d/c home.    Recommendations for follow up therapy are one component of a multi-disciplinary discharge planning process, led by the attending physician.  Recommendations may be updated based on patient status, additional functional criteria and insurance authorization.  Follow Up Recommendations  Home health PT     Assistance Recommended at Discharge Intermittent Supervision/Assistance  Patient can return home with the following Help with stairs or ramp for entrance;Assistance with cooking/housework;Assist for transportation   Equipment Recommendations  None recommended by PT    Recommendations for Other Services       Precautions / Restrictions Precautions Precautions: None;Fall Restrictions Weight Bearing Restrictions: No Other Position/Activity Restrictions: WBAT     Mobility  Bed Mobility Overal bed mobility: Needs Assistance Bed Mobility: Supine to Sit     Supine to sit: Mod assist, Min assist     General bed mobility comments: pt in recliner    Transfers Overall transfer level: Needs assistance Equipment used: Rolling walker (2 wheels) Transfers: Sit to/from Stand Sit to Stand: Min guard           General transfer comment: verbal cues for UE and  LE positioning    Ambulation/Gait Ambulation/Gait assistance: Min guard Gait Distance (Feet): 40 Feet Assistive device: Rolling walker (2 wheels) Gait Pattern/deviations: Step-to pattern, Decreased stance time - left, Antalgic       General Gait Details: verbal cues for sequence, RW positioning, step length; improved pain tolerance and ability to advance LEs   Stairs             Wheelchair Mobility    Modified Rankin (Stroke Patients Only)       Balance                                            Cognition Arousal/Alertness: Awake/alert Behavior During Therapy: WFL for tasks assessed/performed Overall Cognitive Status: Within Functional Limits for tasks assessed                                          Exercises Total Joint Exercises Ankle Circles/Pumps: AROM, Both, 10 reps, Seated Long Arc Quad: AROM, Seated, Both, 10 reps Marching in Standing: AROM, Both, 10 reps, Seated    General Comments        Pertinent Vitals/Pain Pain Assessment Pain Assessment: 0-10 Pain Score: 5  Pain Location: left hip with weight bearing Pain Descriptors / Indicators: Sore, Aching Pain Intervention(s): Repositioned, Monitored during session, Premedicated before session, Ice applied    Home Living  Prior Function            PT Goals (current goals can now be found in the care plan section) Progress towards PT goals: Progressing toward goals    Frequency    Min 5X/week      PT Plan Current plan remains appropriate    Co-evaluation              AM-PAC PT "6 Clicks" Mobility   Outcome Measure  Help needed turning from your back to your side while in a flat bed without using bedrails?: A Little Help needed moving from lying on your back to sitting on the side of a flat bed without using bedrails?: A Little Help needed moving to and from a bed to a chair (including a wheelchair)?: A  Little Help needed standing up from a chair using your arms (e.g., wheelchair or bedside chair)?: A Little Help needed to walk in hospital room?: A Little Help needed climbing 3-5 steps with a railing? : A Lot 6 Click Score: 17    End of Session Equipment Utilized During Treatment: Gait belt Activity Tolerance: Patient tolerated treatment well Patient left: in chair;with call bell/phone within reach Nurse Communication: Mobility status PT Visit Diagnosis: Difficulty in walking, not elsewhere classified (R26.2)     Time: 6629-4765 PT Time Calculation (min) (ACUTE ONLY): 22 min  Charges:  $Gait Training: 8-22 mins                     Arlyce Dice, DPT Physical Therapist [facility] Preferred contact method: Secure Chat Weekend Pager Only: [phone] Office: [place] 10/28/2022, 3:43 PM

## 2022-10-28 NOTE — Progress Notes (Signed)
PROGRESS NOTE  Carrie [name]  KGU:[id number] DOB: Mar 07, 1956 DOA: 10/26/2022 PCP: Bartholome Bill, MD   Brief Narrative:  Patient is a 67 year old female with history of diabetes type 2, hypertension, moderate persistent asthma, anemia of chronic disease who presented from home with complaints of left hip pain after a fall. On condition she was hemodynamically stable. X-ray of the left hip/pelvis showed acute fracture of the proximal left femur. Orthopedics consulted.  Underwent left total hip arthroplasty on 1/12.  PT recommending outpatient follow-up/HH.   Hospital course remarkable for acute blood loss anemia.  She developed pain on the surgical area today.  Plan for discharge tomorrow to home if she feels comfortable.  Assessment & Plan:  Principal Problem:   Closed fracture of neck of left femur (HCC) Active Problems:   DM2 (diabetes mellitus, type 2) (HCC)   Essential hypertension   Leukocytosis   Fall at home, initial encounter   Moderate persistent asthma   Anemia of chronic disease  Acute left femoral neck fracture: Continue pain management, supportive care.  Status post left hip total arthroplasty.  PT recommending outpatient follow-up/mild.  Started on aspirin for DVT prophylaxis.  Continue pain management, supportive care, bowel regimen. Complains of pain on the surgical site and lack of bowel movement.   Leukocytosis: WBC of 14 K on presentation.  Likely reactive.  Chest x-ray did not show any acute process.  UA not suspicious for UTI.  Continue to monitor.     Hypertension: Takes losartan at home.  Continue monitoring.  Blood pressure stable.  Remains in sinus tach today most likely from pain.   Diabetes type 2: Takes metformin at home.  Also on Trulicity.  Recent hemoglobin A1c of 6.  Continue sliding scale insulin.  Will monitor her sugars   Moderate persistent asthma: Continue bronchodilators as needed   Acute on chronic anemia: Baseline hemoglobin of 9.  Dropped  to the range of 6 on 1/12 and was given a unit of blood transfusion.  Hemoglobin currently stable in the range of 8   Mild AKI: Started on gentle IV fluids.  Improving. check BMP tomorrow        DVT prophylaxis:SCDs Start: 10/26/22 1324 SCDs Start: 10/26/22 0255     Code Status: Full Code  Family Communication: None at bedside  Patient status: Inpatient  Patient is from : Home  Anticipated discharge to: Home  Estimated DC date: Tomorrow   Consultants: Orthopedics  Procedures: ORIF  Antimicrobials:  Anti-infectives (From admission, onward)    Start     Dose/Rate Route Frequency Ordered Stop   10/27/22 1000  ceFAZolin (ANCEF) IVPB 2g/100 mL premix        2 g 200 mL/hr over 30 Minutes Intravenous Every 6 hours 10/27/22 0851 10/27/22 1547   10/26/22 0815  ceFAZolin (ANCEF) IVPB 2g/100 mL premix        2 g 200 mL/hr over 30 Minutes Intravenous On call to O.R. 10/26/22 0810 10/26/22 0923   10/26/22 0729  ceFAZolin (ANCEF) 2-4 GM/100ML-% IVPB       Note to Pharmacy: Jasmine Pang K: cabinet override      10/26/22 0729 10/26/22 0923       Subjective: Patient seen and examined at bedside today.  She looks somewhat comfortable.  She complains of increased pain on the surgical area on the left hip.  Also report due to lack of bowel movement.  Objective: Vitals:   10/27/22 2029 10/27/22 2122 10/28/22 0521 10/28/22 0655  BP:  136/65  (!) 141/67   Pulse: (!) 110   (!) 117  Resp: (!) 23     Temp: 99 F (37.2 C)  98.6 F (37 C)   TempSrc: Oral  Oral   SpO2: 91% 93% 90%   Weight:      Height:        Intake/Output Summary (Last 24 hours) at 10/28/2022 1032 Last data filed at 10/28/2022 0239 Gross per 24 hour  Intake 1775.34 ml  Output 2150 ml  Net -374.66 ml   Filed Weights   10/26/22 0838  Weight: 63.5 kg    Examination:    General exam: Overall comfortable, not in distress HEENT: PERRL Respiratory system:  no wheezes or crackles  Cardiovascular system:  S1 & S2 heard, RRR.  Gastrointestinal system: Abdomen is nondistended, soft and nontender. Central nervous system: Alert and oriented Extremities: No edema, no clubbing ,no cyanosis, tenderness on the left hip Skin: No rashes, no ulcers,no icterus     Data Reviewed: I have personally reviewed following labs and imaging studies  CBC: Recent Labs  Lab 10/26/22 0100 10/26/22 0446 10/26/22 1221 10/27/22 0357 10/27/22 1134 10/28/22 0540  WBC 14.1* 17.0*  --  10.6* 10.2 13.5*  NEUTROABS 9.5* 11.7*  --   --   --   --   HGB 9.8* 8.7* 7.2* 6.8* 7.9* 8.9*  HCT 32.5* 28.5* 23.9* 22.9* 25.5* 29.0*  MCV 72.7* 72.0*  --  73.6* 76.3* 77.1*  PLT 379 326  --  238 217 454   Basic Metabolic Panel: Recent Labs  Lab 10/26/22 0100 10/26/22 0446 10/27/22 0357 10/28/22 0540  NA 132* 135 134* 132*  K 3.9 3.7 5.0 4.5  CL 101 105 102 102  CO2 20* 21* 24 20*  GLUCOSE 165* 146* 171* 133*  BUN 38* 43* 38* 22  CREATININE 1.18* 0.63 1.40* 1.17*  CALCIUM 9.1 8.6* 7.9* 7.9*  MG  --  1.9  --   --      Recent Results (from the past 240 hour(s))  Surgical pcr screen     Status: None   Collection Time: 10/26/22  8:47 AM   Specimen: Nasal Mucosa; Nasal Swab  Result Value Ref Range Status   MRSA, PCR NEGATIVE NEGATIVE Final   Staphylococcus aureus NEGATIVE NEGATIVE Final    Comment: (NOTE) The Xpert SA Assay (FDA approved for NASAL specimens in patients 72 years of age and older), is one component of a comprehensive surveillance program. It is not intended to diagnose infection nor to guide or monitor treatment. Performed at [facility], [place] [address] [place], [place] [address]      Radiology Studies: No results found.  Scheduled Meds:  aspirin  81 mg Oral BID   busPIRone  10 mg Oral QHS   celecoxib  200 mg Oral BID   docusate sodium  100 mg Oral BID   DULoxetine  30 mg Oral Daily   ferrous sulfate  325 mg Oral BID WC   insulin aspart  0-6 Units Subcutaneous Q6H    metFORMIN  500 mg Oral Q supper   pantoprazole  40 mg Oral Daily   polyethylene glycol  17 g Oral Daily   primidone  250 mg Oral QHS   senna-docusate  1 tablet Oral BID   Continuous Infusions:  sodium chloride 75 mL/hr at 10/28/22 0737   methocarbamol (ROBAXIN) IV       LOS: 2 days   Shelly Coss, MD [facility] P1/14/2024, 10:32  AM  

## 2022-10-28 NOTE — Progress Notes (Addendum)
Subjective: 2 Days Post-Op Procedure(s) (LRB): TOTAL HIP ARTHROPLASTY ANTERIOR APPROACH (Left) Patient reports pain as moderate.  Slow progress with physical therapy due to left hip pain.  Patient is out of bed to the chair now.  Taking by mouth and voiding okay.  No BM yet.  Objective: Vital signs in last 24 hours: Temp:  [98.2 F (36.8 C)-99 F (37.2 C)] 98.6 F (37 C) (01/14 0521) Pulse Rate:  [95-117] 117 (01/14 0655) Resp:  [20-23] 23 (01/13 2029) BP: (130-141)/(63-67) 141/67 (01/14 0521) SpO2:  [90 %-95 %] 90 % (01/14 0521)  Intake/Output from previous day: 01/13 0701 - 01/14 0700 In: 2450.1 [P.O.:480; I.V.:1095.3; Blood:674.8; IV Piggyback:200] Out: 2150 [Urine:2150] Intake/Output this shift: No intake/output data recorded.  Recent Labs    10/26/22 0446 10/26/22 1221 10/27/22 0357 10/27/22 1134 10/28/22 0540  HGB 8.7* 7.2* 6.8* 7.9* 8.9*   Recent Labs    10/27/22 1134 10/28/22 0540  WBC 10.2 13.5*  RBC 3.34* 3.76*  HCT 25.5* 29.0*  PLT 217 232   Recent Labs    10/27/22 0357 10/28/22 0540  NA 134* 132*  K 5.0 4.5  CL 102 102  CO2 24 20*  BUN 38* 22  CREATININE 1.40* 1.17*  GLUCOSE 171* 133*  CALCIUM 7.9* 7.9*   Recent Labs    10/26/22 0446  INR 1.3*   Left hip exam: Aquacel dressing clean and dry. Neurovascular intact Sensation intact distally Intact pulses distally Dorsiflexion/Plantar flexion intact Incision: dressing C/D/I No cellulitis present Compartment soft   Assessment/Plan: 2 Days Post-Op Procedure(s) (LRB): TOTAL HIP ARTHROPLASTY ANTERIOR APPROACH (Left) Plan: Up with therapy weightbearing as tolerated left lower extremity without hip precautions.  Continue aspirin 81 mg twice daily along with SCDs bilaterally for DVT prophylaxis.  I will add Celebrex 200 mg twice daily to help with pain management. Encouraged her to ambulate in the hall later today with physical therapy.  Hopefully can discharge her home tomorrow with home  health PT. Convert IV to saline lock. Will change pain meds to Percocet 5 mg and discontinue hydrocodone 5 mg.  She does not seem to be getting good pain relief which is inhibiting her ability to ambulate.  Anticipated LOS equal to or greater than 2 midnights due to - Age 66 and older with one or more of the following:   - Expected need for hospital services (PT, OT, Nursing) required for safe  discharge   OR   - Unanticipated findings during/Post Surgery: Slow post-op progression: GI, pain control, mobility   Erlene Senters [phone] 10/28/2022, 10:26 AM

## 2022-10-28 NOTE — Progress Notes (Signed)
Physical Therapy Treatment Patient Details Name: Carrie [name] MRN: [id number] DOB: 1956-06-09 Today's Date: 10/28/2022   History of Present Illness 67 year old female with history of diabetes type 2, hypertension, moderate persistent asthma, anemia of chronic disease, CVA, Bil TKAs who presented from home with complaints of left hip pain after a fall.  Pt sustained left femoral neck fx and s/p left total hip arthroplasty on 10/26/22.    PT Comments    Pt assisted with mobility however only able to tolerate approx 7 feet in room due to increased pain.  Ortho PA into room upon pt settling in recliner.   Will return to attempt ambulation again as plan is for pt to d/c home. RN notified of pain.   Recommendations for follow up therapy are one component of a multi-disciplinary discharge planning process, led by the attending physician.  Recommendations may be updated based on patient status, additional functional criteria and insurance authorization.  Follow Up Recommendations  Home health PT     Assistance Recommended at Discharge Intermittent Supervision/Assistance  Patient can return home with the following Help with stairs or ramp for entrance;Assistance with cooking/housework;Assist for transportation   Equipment Recommendations  None recommended by PT    Recommendations for Other Services       Precautions / Restrictions Precautions Precautions: None;Fall Restrictions Weight Bearing Restrictions: No Other Position/Activity Restrictions: WBAT     Mobility  Bed Mobility Overal bed mobility: Needs Assistance Bed Mobility: Supine to Sit     Supine to sit: Mod assist, Min assist     General bed mobility comments: assist for LEs due to pain    Transfers Overall transfer level: Needs assistance Equipment used: Rolling walker (2 wheels) Transfers: Sit to/from Stand Sit to Stand: Min assist           General transfer comment: verbal cues for UE and LE positioning,  assist to rise    Ambulation/Gait Ambulation/Gait assistance: Min guard Gait Distance (Feet): 7 Feet Assistive device: Rolling walker (2 wheels) Gait Pattern/deviations: Step-to pattern, Decreased stance time - left, Antalgic       General Gait Details: verbal cues for sequence, RW positioning, step length; pain limiting to in room only   Stairs             Wheelchair Mobility    Modified Rankin (Stroke Patients Only)       Balance                                            Cognition Arousal/Alertness: Awake/alert Behavior During Therapy: WFL for tasks assessed/performed Overall Cognitive Status: Within Functional Limits for tasks assessed                                          Exercises      General Comments        Pertinent Vitals/Pain Pain Assessment Pain Assessment: 0-10 Pain Score: 8  Pain Location: left hip with weight bearing Pain Descriptors / Indicators: Sore, Operative site guarding Pain Intervention(s): Repositioned, Monitored during session    Home Living                          Prior Function  PT Goals (current goals can now be found in the care plan section) Progress towards PT goals: Progressing toward goals    Frequency    Min 5X/week      PT Plan Current plan remains appropriate    Co-evaluation              AM-PAC PT "6 Clicks" Mobility   Outcome Measure  Help needed turning from your back to your side while in a flat bed without using bedrails?: A Little Help needed moving from lying on your back to sitting on the side of a flat bed without using bedrails?: A Little Help needed moving to and from a bed to a chair (including a wheelchair)?: A Little Help needed standing up from a chair using your arms (e.g., wheelchair or bedside chair)?: A Little Help needed to walk in hospital room?: A Little Help needed climbing 3-5 steps with a railing? : A Lot 6  Click Score: 17    End of Session Equipment Utilized During Treatment: Gait belt Activity Tolerance: Patient limited by pain Patient left: in chair;with chair alarm set;with call bell/phone within reach Nurse Communication: Mobility status PT Visit Diagnosis: Difficulty in walking, not elsewhere classified (R26.2)     Time: 2585-2778 PT Time Calculation (min) (ACUTE ONLY): 25 min  Charges:  $Therapeutic Activity: 23-37 mins           Jannette Spanner PT, DPT Physical Therapist Acute Rehabilitation Services Preferred contact method: Secure Chat Weekend Pager Only: [phone] Office: [phone]    Myrtis Hopping [place] 10/28/2022, 3:31 PM

## 2022-10-29 ENCOUNTER — Encounter (HOSPITAL_COMMUNITY): Payer: Self-pay | Admitting: Orthopedic Surgery

## 2022-10-29 DIAGNOSIS — S72002A Fracture of unspecified part of neck of left femur, initial encounter for closed fracture: Secondary | ICD-10-CM | POA: Diagnosis not present

## 2022-10-29 LAB — BASIC METABOLIC PANEL
Anion gap: 6 (ref 5–15)
BUN: 18 mg/dL (ref 8–23)
CO2: 24 mmol/L (ref 22–32)
Calcium: 8.2 mg/dL — ABNORMAL LOW (ref 8.9–10.3)
Chloride: 105 mmol/L (ref 98–111)
Creatinine, Ser: 1.09 mg/dL — ABNORMAL HIGH (ref 0.44–1.00)
GFR, Estimated: 56 mL/min — ABNORMAL LOW (ref >60)
Glucose, Bld: 93 mg/dL (ref 70–99)
Potassium: 4.5 mmol/L (ref 3.5–5.1)
Sodium: 135 mmol/L (ref 135–145)

## 2022-10-29 LAB — CBC
HCT: 26.5 % — ABNORMAL LOW (ref 36.0–46.0)
Hemoglobin: 7.9 g/dL — ABNORMAL LOW (ref 12.0–15.0)
MCH: 23.2 pg — ABNORMAL LOW (ref 26.0–34.0)
MCHC: 29.8 g/dL — ABNORMAL LOW (ref 30.0–36.0)
MCV: 77.9 fL — ABNORMAL LOW (ref 80.0–100.0)
Platelets: 219 10*3/uL (ref 150–400)
RBC: 3.4 MIL/uL — ABNORMAL LOW (ref 3.87–5.11)
RDW: 19.9 % — ABNORMAL HIGH (ref 11.5–15.5)
WBC: 8.9 10*3/uL (ref 4.0–10.5)
nRBC: 0 % (ref 0.0–0.2)

## 2022-10-29 LAB — GLUCOSE, CAPILLARY
Glucose-Capillary: 170 mg/dL — ABNORMAL HIGH (ref 70–99)
Glucose-Capillary: 79 mg/dL (ref 70–99)

## 2022-10-29 NOTE — Progress Notes (Signed)
Subjective: 3 Days Post-Op Procedure(s) (LRB): TOTAL HIP ARTHROPLASTY ANTERIOR APPROACH (Left) Patient reports pain as mild.  Made good progress with physical therapy yesterday.  Ambulated 40 feet.  Taking by mouth and voiding okay.  No dizziness or shortness of breath.  Objective: Vital signs in last 24 hours: Temp:  [98.3 F (36.8 C)-99.2 F (37.3 C)] 98.3 F (36.8 C) (01/15 0605) Pulse Rate:  [90-108] 90 (01/15 0605) Resp:  [18-20] 19 (01/15 0605) BP: (127-146)/(65-73) 146/68 (01/15 0605) SpO2:  [97 %-99 %] 99 % (01/15 0605)  Intake/Output from previous day: 01/14 0701 - 01/15 0700 In: 551.2 [I.V.:551.2] Out: 200 [Urine:200] Intake/Output this shift: No intake/output data recorded.  Recent Labs    10/26/22 1221 10/27/22 0357 10/27/22 1134 10/28/22 0540  HGB 7.2* 6.8* 7.9* 8.9*   Recent Labs    10/27/22 1134 10/28/22 0540  WBC 10.2 13.5*  RBC 3.34* 3.76*  HCT 25.5* 29.0*  PLT 217 232   Recent Labs    10/27/22 0357 10/28/22 0540  NA 134* 132*  K 5.0 4.5  CL 102 102  CO2 24 20*  BUN 38* 22  CREATININE 1.40* 1.17*  GLUCOSE 171* 133*  CALCIUM 7.9* 7.9*   No results for input(s): "LABPT", "INR" in the last 72 hours. Left hip exam: Neurovascular intact Sensation intact distally Intact pulses distally Dorsiflexion/Plantar flexion intact Incision: dressing C/D/I No cellulitis present Compartment soft   Assessment/Plan: 3 Days Post-Op Procedure(s) (LRB): TOTAL HIP ARTHROPLASTY ANTERIOR APPROACH (Left) Plan: Up with therapy Discharge home with home health Weight-bear as tolerated on left hip without hip precautions. Aspirin 325 mg enteric-coated twice daily x 1 months postop for DVT prophylaxis. Her prescriptions for pain and a muscle relaxer have been sent into her pharmacy.    Carrie [name] 10/29/2022, 7:14 AM

## 2022-10-29 NOTE — Discharge Summary (Addendum)
Patient ID: Carrie [name] MRN: [id number] DOB/AGE: [birth year] 67 y.o.  Admit date: 10/26/2022 Discharge date: 10/29/2022  Admission Diagnoses:  Principal Problem:   Closed fracture of neck of left femur ([place]) Active Problems:   Essential hypertension   Leukocytosis   DM2 (diabetes mellitus, type 2) ([place])   Fall at home, initial encounter   Moderate persistent asthma   Anemia of chronic disease   Discharge Diagnoses:  Same  Past Medical History:  Diagnosis Date   Anxiety    Arthritis    psoriatic arithritis, DDD    Asthma    Depression    Diabetes mellitus    type 2   Gastritis    Gastroparesis    GERD (gastroesophageal reflux disease)    Hernia    Hyperlipemia    Hyperlipemia    Hypertension    Liver cirrhosis ([facility])    Nausea and vomiting 01/06/2013   Neuropathy    Bil feet re: to Diabetes   Obesity    Osteoporosis    Plantar fasciitis    Stricture and stenosis of esophagus    Stroke ([place])    TIA (transient ischemic attack)    8-10 yrs ago, no problems since   Tuberculosis    tested positive 2011, no symptoms, was on medicine for 6 months    Surgeries: Procedure(s): Left TOTAL HIP ARTHROPLASTY ANTERIOR APPROACH on 10/26/2022   Consultants:   Discharged Condition: Improved  Hospital Course: Carrie [name] is an 67 y.o. female who was admitted 10/26/2022 for operative treatment ofClosed fracture of neck of left femur ([place]). Patient has severe unremitting pain that affects sleep, daily activities, and work/hobbies. After pre-op clearance the patient was taken to the operating room on 10/26/2022 and underwent  Procedure(s): Left [place].    Patient was given perioperative antibiotics:  Anti-infectives (From admission, onward)    Start     Dose/Rate Route Frequency Ordered Stop   10/27/22 1000  ceFAZolin (ANCEF) IVPB 2g/100 mL premix        2 g 200 mL/hr over 30 Minutes Intravenous Every 6 hours 10/27/22 0851 10/27/22 1547    10/26/22 0815  ceFAZolin (ANCEF) IVPB 2g/100 mL premix        2 g 200 mL/hr over 30 Minutes Intravenous On call to O.R. 10/26/22 0810 10/26/22 0923   10/26/22 0729  ceFAZolin (ANCEF) 2-4 GM/100ML-% IVPB       Note to Pharmacy: Jasmine Pang K: cabinet override      10/26/22 0729 10/26/22 0923        Patient was given sequential compression devices, early ambulation, and chemoprophylaxis to prevent DVT.  Patient benefited maximally from hospital stay and there were no complications.    Recent vital signs: Patient Vitals for the past 24 hrs:  BP Temp Temp src Pulse Resp SpO2  10/29/22 0605 (!) 146/68 98.3 F (36.8 C) Oral 90 19 99 %  10/28/22 2044 (!) 144/73 99.2 F (37.3 C) Oral (!) 103 18 97 %  10/28/22 1308 127/65 98.4 F (36.9 C) Oral (!) 108 20 97 %     Recent laboratory studies:  Recent Labs    10/28/22 0540 10/29/22 0555  WBC 13.5* 8.9  HGB 8.9* 7.9*  HCT 29.0* 26.5*  PLT 232 219  NA 132* 135  K 4.5 4.5  CL 102 105  CO2 20* 24  BUN 22 18  CREATININE 1.17* 1.09*  GLUCOSE 133* 93  CALCIUM 7.9* 8.2*     Discharge  Medications:   Allergies as of 10/29/2022       Reactions   Lisinopril Cough        Medication List     STOP taking these medications    amoxicillin 500 MG capsule Commonly known as: AMOXIL   aspirin 81 MG chewable tablet Replaced by: aspirin EC 325 MG tablet   benzonatate 100 MG capsule Commonly known as: TESSALON   doxycycline 100 MG tablet Commonly known as: VIBRA-TABS   Flovent HFA 110 MCG/ACT inhaler Generic drug: fluticasone   pantoprazole 40 MG tablet Commonly known as: PROTONIX   predniSONE 10 MG tablet Commonly known as: DELTASONE   primidone 250 MG tablet Commonly known as: MYSOLINE       TAKE these medications    Accu-Chek Guide Me w/Device Kit Use as instructed to check blood sugar 2 times daily Dx E11.9   Accu-Chek Softclix Lancets lancets Use as instructed to check blood sugar 2 times daily Dx E11.9    albuterol 108 (90 Base) MCG/ACT inhaler Commonly known as: VENTOLIN HFA Inhale 2 puffs into the lungs every 4 (four) hours as needed for wheezing or shortness of breath (or coughing).   amLODipine 2.5 MG tablet Commonly known as: NORVASC Take 2.5 mg by mouth daily.   aspirin EC 325 MG tablet Take 1 tablet (325 mg total) by mouth daily after breakfast. Take x 1 month post op to decrease risk of blood clots. Replaces: aspirin 81 MG chewable tablet   busPIRone 5 MG tablet Commonly known as: BUSPAR Take 10 mg by mouth at bedtime.   CALCIUM CARBONATE-VITAMIN D3 PO Take 1 tablet by mouth daily.   celecoxib 200 MG capsule Commonly known as: CeleBREX Take 1 capsule (200 mg total) by mouth 2 (two) times daily.   Cosentyx Sensoready (300 MG) 150 MG/ML [place] Generic drug: Secukinumab (300 MG Dose) Inject 300 mg into the skin every 30 (thirty) days.   docusate sodium 100 MG capsule Commonly known as: COLACE Take 1 capsule (100 mg total) by mouth 2 (two) times daily. What changed:  when to take this reasons to take this   DULoxetine 60 MG capsule Commonly known as: CYMBALTA Take 60 mg by mouth daily. What changed: Another medication with the same name was removed. Continue taking this medication, and follow the directions you see here.   FLUoxetine 20 MG capsule Commonly known as: PROZAC Take 20 mg by mouth daily.   HYDROcodone-acetaminophen 5-325 MG tablet Commonly known as: Norco Take 1 tablet by mouth every 6 (six) hours as needed for moderate pain.   losartan 100 MG tablet Commonly known as: COZAAR Take 100 mg by mouth every morning.   metFORMIN 500 MG 24 hr tablet Commonly known as: GLUCOPHAGE-XR Take 500 mg by mouth at bedtime. What changed: Another medication with the same name was removed. Continue taking this medication, and follow the directions you see here.   NexIUM 40 MG capsule Generic drug: esomeprazole Take 40 mg by mouth 2 (two) times daily before a  meal.   polyethylene glycol 17 g packet Commonly known as: MIRALAX / GLYCOLAX Take 17 g by mouth 2 (two) times daily. What changed:  when to take this reasons to take this   pravastatin 80 MG tablet Commonly known as: PRAVACHOL Take 80 mg by mouth daily. What changed: Another medication with the same name was removed. Continue taking this medication, and follow the directions you see here.   Qvar RediHaler 80 MCG/ACT inhaler Generic drug: beclomethasone Inhale  1 puff into the lungs in the morning and at bedtime.   tiZANidine 2 MG tablet Commonly known as: ZANAFLEX Take 1 tablet (2 mg total) by mouth every 8 (eight) hours as needed for muscle spasms.   Trulicity 3 ZO/1.0RU Sopn Generic drug: Dulaglutide Inject 3 mg as directed once a week. What changed: when to take this               Durable Medical Equipment  (From admission, onward)           Start     Ordered   10/26/22 1324  DME Walker rolling  Once       Question:  Patient needs a walker to treat with the following condition  Answer:  Primary osteoarthritis of right hip   10/26/22 1323   10/26/22 1324  DME 3 n 1  Once        10/26/22 1323              Discharge Care Instructions  (From admission, onward)           Start     Ordered   10/29/22 0000  Weight bearing as tolerated       Question Answer Comment  Laterality left   Extremity Lower      10/29/22 0719            Diagnostic Studies: DG HIP UNILAT WITH PELVIS 1V LEFT  Result Date: 10/26/2022 CLINICAL DATA:  67 year old female with left femoral neck fracture, undergoing left hip surgery. EXAM: DG HIP (WITH OR WITHOUT PELVIS) 1V*L* COMPARISON:  Left hip series 0137 hours today. FINDINGS: Two intraoperative AP fluoroscopic spot views at 1056 hours. Left bipolar hip arthroplasty now in place. Hardware appears intact, with normal AP alignment. No new osseous abnormality identified. Fluoroscopy time: 0 minutes 19 seconds.  1.86 mGy.  IMPRESSION: New left hip arthroplasty with no adverse features. Electronically Signed   By: Genevie Ann M.D.   On: 10/26/2022 10:43   DG C-Arm 1-60 Min-No Report  Result Date: 10/26/2022 Fluoroscopy was utilized by the requesting physician.  No radiographic interpretation.   DG C-Arm 1-60 Min-No Report  Result Date: 10/26/2022 Fluoroscopy was utilized by the requesting physician.  No radiographic interpretation.   DG HIP UNILAT WITH PELVIS 2-3 VIEWS LEFT  Result Date: 10/26/2022 CLINICAL DATA:  Status post fall with left hip pain. EXAM: DG HIP (WITH OR WITHOUT PELVIS) 2-3V LEFT COMPARISON:  None Available. FINDINGS: An acute fracture deformity is seen extending through the neck of the proximal left femur. There is no evidence of dislocation. Soft tissue structures are unremarkable. IMPRESSION: Acute fracture of the proximal left femur. Electronically Signed   By: Virgina Norfolk M.D.   On: 10/26/2022 01:58   DG Chest 1 View  Result Date: 10/26/2022 CLINICAL DATA:  Status post fall. EXAM: CHEST  1 VIEW COMPARISON:  September 30, 2022. FINDINGS: The heart size and mediastinal contours are within normal limits. Both lungs are clear. Evidence of prior vertebroplasty is seen within the upper lumbar spine. IMPRESSION: No active cardiopulmonary disease. Electronically Signed   By: Virgina Norfolk M.D.   On: 10/26/2022 01:57   DG Ribs Unilateral W/Chest Left  Result Date: 09/30/2022 CLINICAL DATA:  Fall out of bed two days ago. Left-sided rib and chest pain. EXAM: LEFT RIBS AND CHEST - 3+ VIEW COMPARISON:  Chest radiograph on 03/03/2022 FINDINGS: No fracture or other bone lesions are seen involving the ribs. There is no evidence of  pneumothorax or pleural effusion. Both lungs are clear. Heart size and mediastinal contours are within normal limits. IMPRESSION: Negative. Electronically Signed   By: Marlaine Hind M.D.   On: 09/30/2022 15:37    Disposition: Discharge disposition: 01-Home or Self  Care       Discharge Instructions     Call MD / Call 911   Complete by: As directed    If you experience chest pain or shortness of breath, CALL 911 and be transported to the hospital emergency room.  If you develope a fever above 101 F, pus (white drainage) or increased drainage or redness at the wound, or calf pain, call your surgeon's office.   Constipation Prevention   Complete by: As directed    Drink plenty of fluids.  Prune juice may be helpful.  You may use a stool softener, such as Colace (over the counter) 100 mg twice a day.  Use MiraLax (over the counter) for constipation as needed.   Diet Carb Modified   Complete by: As directed    Increase activity slowly as tolerated   Complete by: As directed    Post-operative opioid taper instructions:   Complete by: As directed    POST-OPERATIVE OPIOID TAPER INSTRUCTIONS: It is important to wean off of your opioid medication as soon as possible. If you do not need pain medication after your surgery it is ok to stop day one. Opioids include: Codeine, Hydrocodone(Norco, Vicodin), Oxycodone(Percocet, oxycontin) and hydromorphone amongst others.  Long term and even short term use of opiods can cause: Increased pain response Dependence Constipation Depression Respiratory depression And more.  Withdrawal symptoms can include Flu like symptoms Nausea, vomiting And more Techniques to manage these symptoms Hydrate well Eat regular healthy meals Stay active Use relaxation techniques(deep breathing, meditating, yoga) Do Not substitute Alcohol to help with tapering If you have been on opioids for less than two weeks and do not have pain than it is ok to stop all together.  Plan to wean off of opioids This plan should start within one week post op of your joint replacement. Maintain the same interval or time between taking each dose and first decrease the dose.  Cut the total daily intake of opioids by one tablet each day Next  start to increase the time between doses. The last dose that should be eliminated is the evening dose.      Weight bearing as tolerated   Complete by: As directed    Laterality: left   Extremity: Lower        Follow-up Information     Gary Fleet, PA-C. Schedule an appointment as soon as possible for a visit in 2 week(s).   Specialty: Orthopedic Surgery Contact information: [address] [place] [address] [phone]                  Signed: Erlene Senters 10/29/2022, 11:59 AM

## 2022-10-29 NOTE — Plan of Care (Signed)
  Problem: Tissue Perfusion: Goal: Adequacy of tissue perfusion will improve Outcome: Progressing   Problem: Activity: Goal: Ability to avoid complications of mobility impairment will improve Outcome: Progressing Goal: Ability to tolerate increased activity will improve Outcome: Progressing

## 2022-10-29 NOTE — Progress Notes (Signed)
Physical Therapy Treatment Patient Details Name: Carrie [name] MRN: [id number] DOB: [birth year] Today's Date: 10/29/2022   History of Present Illness 67 year old female with history of diabetes type 2, hypertension, moderate persistent asthma, anemia of chronic disease, CVA, Bil TKAs who presented from home with complaints of left hip pain after a fall.  Pt sustained left femoral neck fx and s/p left total hip arthroplasty on 10/26/22.    PT Comments    Pt eager to participate and d/c home today.  Pt able to ambulate short distance and practice steps.  Spouse present and observed session.  Pt feels ready to d/c home today and had no further questions.  Plans to f/u with HHPT upon d/c.    Recommendations for follow up therapy are one component of a multi-disciplinary discharge planning process, led by the attending physician.  Recommendations may be updated based on patient status, additional functional criteria and insurance authorization.  Follow Up Recommendations  Home health PT     Assistance Recommended at Discharge Intermittent Supervision/Assistance  Patient can return home with the following Help with stairs or ramp for entrance;Assistance with cooking/housework;Assist for transportation   Equipment Recommendations  None recommended by PT    Recommendations for Other Services       Precautions / Restrictions Precautions Precautions: None;Fall Restrictions Weight Bearing Restrictions: No Other Position/Activity Restrictions: WBAT     Mobility  Bed Mobility               General bed mobility comments: pt in recliner    Transfers Overall transfer level: Needs assistance Equipment used: Rolling walker (2 wheels) Transfers: Sit to/from Stand Sit to Stand: Min guard           General transfer comment: verbal cues for UE and LE positioning    Ambulation/Gait Ambulation/Gait assistance: Min guard Gait Distance (Feet): 40 Feet Assistive device: Rolling  walker (2 wheels) Gait Pattern/deviations: Step-to pattern, Decreased stance time - left, Antalgic       General Gait Details: verbal cues for sequence, RW positioning, step length; improved pain tolerance and ability to advance LEs   Stairs Stairs: Yes Stairs assistance: Min guard Stair Management: Step to pattern, Forwards, Two rails Number of Stairs: 2 General stair comments: verbal cues for sequence and safety; spouse present and observed; pt reports understanding and declined performing again   Wheelchair Mobility    Modified Rankin (Stroke Patients Only)       Balance                                            Cognition Arousal/Alertness: Awake/alert Behavior During Therapy: WFL for tasks assessed/performed Overall Cognitive Status: Within Functional Limits for tasks assessed                                          Exercises      General Comments        Pertinent Vitals/Pain Pain Assessment Pain Assessment: 0-10 Pain Score: 7  Pain Location: left hip with weight bearing Pain Descriptors / Indicators: Sore, Aching Pain Intervention(s): Repositioned, Monitored during session (medicated just before arrival however wanting to participate and d/c home)    Home Living  Prior Function            PT Goals (current goals can now be found in the care plan section) Progress towards PT goals: Progressing toward goals    Frequency    Min 5X/week      PT Plan Current plan remains appropriate    Co-evaluation              AM-PAC PT "6 Clicks" Mobility   Outcome Measure  Help needed turning from your back to your side while in a flat bed without using bedrails?: A Little Help needed moving from lying on your back to sitting on the side of a flat bed without using bedrails?: A Little Help needed moving to and from a bed to a chair (including a wheelchair)?: A Little Help  needed standing up from a chair using your arms (e.g., wheelchair or bedside chair)?: A Little Help needed to walk in hospital room?: A Little Help needed climbing 3-5 steps with a railing? : A Little 6 Click Score: 18    End of Session Equipment Utilized During Treatment: Gait belt Activity Tolerance: Patient tolerated treatment well Patient left: in chair;with call bell/phone within reach;with family/visitor present;with chair alarm set Nurse Communication: Mobility status PT Visit Diagnosis: Difficulty in walking, not elsewhere classified (R26.2)     Time: 9563-8756 PT Time Calculation (min) (ACUTE ONLY): 14 min  Charges:  $Gait Training: 8-22 mins                    Arlyce Dice, DPT Physical Therapist Acute Rehabilitation Services Preferred contact method: Secure Chat Weekend Pager Only: [phone] Office: [phone]    Carrie [name] Payson 10/29/2022, 12:23 PM

## 2022-10-29 NOTE — Progress Notes (Addendum)
PROGRESS NOTE  Carrie [name]  NLG:[id number] DOB: January 03, 1956 DOA: 10/26/2022 PCP: Bartholome Bill, MD   Brief Narrative:  Patient is a 67 year old female with history of diabetes type 2, hypertension, moderate persistent asthma, anemia of chronic disease who presented from home with complaints of left hip pain after a fall. On condition she was hemodynamically stable. X-ray of the left hip/pelvis showed acute fracture of the proximal left femur. Orthopedics consulted.  Underwent left total hip arthroplasty on 1/12.  PT recommending outpatient follow-up/HH.   Hospital course remarkable for acute blood loss anemia, requiring blood transfusion.  Her pain in the surgical site has significantly improved today.  Orthopedics planning for discharge to home today.  She should follow-up with orthopedics as an outpatient .  Assessment & Plan:  Principal Problem:   Closed fracture of neck of left femur (HCC) Active Problems:   DM2 (diabetes mellitus, type 2) (HCC)   Essential hypertension   Leukocytosis   Fall at home, initial encounter   Moderate persistent asthma   Anemia of chronic disease  Acute left femoral neck fracture: Continue pain management, supportive care.  Status post left hip total arthroplasty.  PT recommending home health. started on aspirin for DVT prophylaxis.  Continue pain management, supportive care, bowel regimen. Follow-up with orthopedics as an outpatient.   Leukocytosis: WBC of 14 K on presentation.  Likely reactive.  Chest x-ray did not show any acute process.  UA not suspicious for UTI.  Improved  Hypertension: Takes losartan at home.    Diabetes type 2: Takes metformin at home.  Also on Trulicity.  Recent hemoglobin A1c of 6.     Moderate persistent asthma: Not in exacerbation   acute on chronic anemia: Baseline hemoglobin of 9.  Dropped to the range of 6 on 1/12 and was given a unit of blood transfusion.  Hemoglobin currently stable.  Check CBC in a week to  check hemoglobin   Mild AKI: Resolved with fluids       DVT prophylaxis:SCDs Start: 10/26/22 1324 SCDs Start: 10/26/22 0255     Code Status: Full Code  Family Communication: Family member at bedside  Patient status: Inpatient  Patient is from : Home  Anticipated discharge to: Home  Estimated DC date: Today   Consultants: Orthopedics  Procedures: ORIF  Antimicrobials:  Anti-infectives (From admission, onward)    Start     Dose/Rate Route Frequency Ordered Stop   10/27/22 1000  ceFAZolin (ANCEF) IVPB 2g/100 mL premix        2 g 200 mL/hr over 30 Minutes Intravenous Every 6 hours 10/27/22 0851 10/27/22 1547   10/26/22 0815  ceFAZolin (ANCEF) IVPB 2g/100 mL premix        2 g 200 mL/hr over 30 Minutes Intravenous On call to O.R. 10/26/22 0810 10/26/22 0923   10/26/22 0729  ceFAZolin (ANCEF) 2-4 GM/100ML-% IVPB       Note to Pharmacy: Jasmine Pang K: cabinet override      10/26/22 0729 10/26/22 0923       Subjective: Patient seen and examined at bedside today. She  was already dressed to go home.  Orthopedics during the discharge summary and orders.  No new complaints  Objective: Vitals:   10/28/22 0655 10/28/22 1308 10/28/22 2044 10/29/22 0605  BP:  127/65 (!) 144/73 (!) 146/68  Pulse: (!) 117 (!) 108 (!) 103 90  Resp:  '20 18 19  '$ Temp:  98.4 F (36.9 C) 99.2 F (37.3 C) 98.3 F (36.8 C)  TempSrc:  Oral Oral Oral  SpO2:  97% 97% 99%  Weight:      Height:       No intake or output data in the 24 hours ending 10/29/22 1041  Filed Weights   10/26/22 0838  Weight: 63.5 kg    Examination:    General exam: Overall comfortable, not in distress HEENT: PERRL Respiratory system:  no wheezes or crackles  Cardiovascular system: S1 & S2 heard, RRR.  Gastrointestinal system: Abdomen is nondistended, soft and nontender. Central nervous system: Alert and oriented Extremities: No edema, no clubbing ,no cyanosis, surgical wound on the left hip Skin: No  rashes, no ulcers,no icterus    Data Reviewed: I have personally reviewed following labs and imaging studies  CBC: Recent Labs  Lab 10/26/22 0100 10/26/22 0446 10/26/22 1221 10/27/22 0357 10/27/22 1134 10/28/22 0540 10/29/22 0555  WBC 14.1* 17.0*  --  10.6* 10.2 13.5* 8.9  NEUTROABS 9.5* 11.7*  --   --   --   --   --   HGB 9.8* 8.7* 7.2* 6.8* 7.9* 8.9* 7.9*  HCT 32.5* 28.5* 23.9* 22.9* 25.5* 29.0* 26.5*  MCV 72.7* 72.0*  --  73.6* 76.3* 77.1* 77.9*  PLT 379 326  --  238 217 232 297   Basic Metabolic Panel: Recent Labs  Lab 10/26/22 0100 10/26/22 0446 10/27/22 0357 10/28/22 0540 10/29/22 0555  NA 132* 135 134* 132* 135  K 3.9 3.7 5.0 4.5 4.5  CL 101 105 102 102 105  CO2 20* 21* 24 20* 24  GLUCOSE 165* 146* 171* 133* 93  BUN 38* 43* 38* 22 18  CREATININE 1.18* 0.63 1.40* 1.17* 1.09*  CALCIUM 9.1 8.6* 7.9* 7.9* 8.2*  MG  --  1.9  --   --   --      Recent Results (from the past 240 hour(s))  Surgical pcr screen     Status: None   Collection Time: 10/26/22  8:47 AM   Specimen: Nasal Mucosa; Nasal Swab  Result Value Ref Range Status   MRSA, PCR NEGATIVE NEGATIVE Final   Staphylococcus aureus NEGATIVE NEGATIVE Final    Comment: (NOTE) The Xpert SA Assay (FDA approved for NASAL specimens in patients 63 years of age and older), is one component of a comprehensive surveillance program. It is not intended to diagnose infection nor to guide or monitor treatment. Performed at [facility], [place] [address] [place], [place] [address]      Radiology Studies: No results found.  Scheduled Meds:  aspirin  81 mg Oral BID   busPIRone  10 mg Oral QHS   celecoxib  200 mg Oral BID   docusate sodium  100 mg Oral BID   DULoxetine  30 mg Oral Daily   ferrous sulfate  325 mg Oral BID WC   insulin aspart  0-6 Units Subcutaneous Q6H   metFORMIN  500 mg Oral Q supper   pantoprazole  40 mg Oral Daily   polyethylene glycol  17 g Oral Daily   primidone  250 mg  Oral QHS   senna-docusate  1 tablet Oral BID   Continuous Infusions:  sodium chloride Stopped (10/29/22 0416)   methocarbamol (ROBAXIN) IV       LOS: 3 days   Shelly Coss, MD [facility] P1/15/2024, 10:41 AM

## 2022-10-29 NOTE — Progress Notes (Signed)
Patient discharged home with husband, discharge instructions given and explained to patient. Patient denies any pain/distress. Surgical incision dressing clean/dry/intact. Accompanied home by husband.

## 2022-11-05 ENCOUNTER — Encounter (HOSPITAL_COMMUNITY): Admission: RE | Admit: 2022-11-05 | Payer: 59 | Source: Ambulatory Visit

## 2022-11-13 ENCOUNTER — Ambulatory Visit: Admit: 2022-11-13 | Payer: 59 | Admitting: Orthopaedic Surgery

## 2022-11-13 SURGERY — ARTHROSCOPY, SHOULDER
Anesthesia: Choice | Site: Shoulder | Laterality: Right

## 2022-11-16 ENCOUNTER — Encounter: Payer: Self-pay | Admitting: Infectious Diseases

## 2022-11-16 ENCOUNTER — Ambulatory Visit (INDEPENDENT_AMBULATORY_CARE_PROVIDER_SITE_OTHER): Payer: 59 | Admitting: Infectious Diseases

## 2022-11-16 ENCOUNTER — Other Ambulatory Visit: Payer: Self-pay

## 2022-11-16 VITALS — BP 159/89 | HR 102 | Temp 97.9°F | Wt 141.0 lb

## 2022-11-16 DIAGNOSIS — I10 Essential (primary) hypertension: Secondary | ICD-10-CM | POA: Diagnosis not present

## 2022-11-16 DIAGNOSIS — T8454XD 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, subsequent encounter: Secondary | ICD-10-CM | POA: Diagnosis not present

## 2022-11-16 DIAGNOSIS — K7460 Unspecified cirrhosis of liver: Secondary | ICD-10-CM

## 2022-11-16 DIAGNOSIS — Z5181 Encounter for therapeutic drug level monitoring: Secondary | ICD-10-CM

## 2022-11-16 DIAGNOSIS — L4050 Arthropathic psoriasis, unspecified: Secondary | ICD-10-CM

## 2022-11-16 DIAGNOSIS — Z96659 Presence of unspecified artificial knee joint: Secondary | ICD-10-CM

## 2022-11-16 NOTE — Progress Notes (Addendum)
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   Hepatic cirrhosis ([place]) 04/06/2022   AKI superimposed on CKD IIIa 03/11/2022   Lightheadedness 03/11/2022   Anxiety and depression 03/11/2022   DM2 (diabetes mellitus, type 2) ([place]) 03/11/2022   Microcytic anemia 03/11/2022   Elevated d-dimer 03/11/2022   Medication monitoring encounter 11/07/2021   Psoriatic arthritis ([place]) 11/07/2021   Pressure injury of skin 04/13/2021   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[place]) 03/16/2020   Bowel obstruction ([place]) 02/08/2020   Leukocytosis 02/08/2020   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   Psoriasis 07/08/2017   Mild neurocognitive disorder 10/18/2016   Diabetes ([place]) 02/16/2016   Burn from the sun 02/25/2015   Chest pain 12/04/2013   Diabetes mellitus type II, uncontrolled 12/04/2013   Benign essential HTN 12/04/2013   Hypoglycemia 12/04/2013   Acute pharyngitis 09/11/2013   Candidiasis of mouth 09/11/2013   Essential hypertension 04/21/2013   Gastroparesis 02/25/2013   Cough 02/25/2013   Nausea and vomiting 01/06/2013   Nausea alone 05/15/2011   STRICTURE AND STENOSIS OF ESOPHAGUS 10/21/2009   Hyperlipidemia 01/09/2008   OBESITY 01/09/2008   Asthma 01/09/2008   GERD with esophageal stricture and stenosis s/p dilation  01/09/2008   HERNIA, VENTRAL 01/09/2008   ARTHRITIS 01/09/2008   Depression 03/25/2007   ANXIETY  03/25/2007    Patient's Medications  New Prescriptions   No medications on file  Previous Medications   ACCU-CHEK SOFTCLIX LANCETS LANCETS    Use as instructed to check blood sugar 2 times daily Dx E11.9   ALBUTEROL (VENTOLIN HFA) 108 (90 BASE) MCG/ACT INHALER    Inhale 2 puffs into the lungs every 4 (four) hours as needed for wheezing or shortness of breath (or coughing).   AMLODIPINE (NORVASC) 2.5 MG TABLET    Take 2.5 mg by mouth daily.   ASPIRIN EC 325 MG TABLET    Take 1 tablet (325 mg total) by mouth daily after breakfast. Take x 1 month post op to decrease risk of blood clots.   BLOOD GLUCOSE MONITORING SUPPL (ACCU-CHEK GUIDE ME) W/DEVICE KIT    Use as instructed to check blood sugar 2 times daily Dx E11.9   BUSPIRONE (BUSPAR) 5 MG TABLET    Take 10 mg by mouth at bedtime.   CALCIUM CARB-CHOLECALCIFEROL (CALCIUM CARBONATE-VITAMIN D3 PO)    Take 1 tablet by mouth daily.   CELECOXIB (CELEBREX) 200 MG CAPSULE    Take 1 capsule (200 mg total) by mouth 2 (two) times daily.   COSENTYX SENSOREADY, 300 MG, 150 MG/ML SOAJ    Inject 300 mg into the skin every 30 (thirty) days.   DOCUSATE SODIUM (COLACE) 100 MG CAPSULE    Take 1 capsule (100 mg total) by mouth 2 (two) times daily.   DULAGLUTIDE (TRULICITY) 3 QM/5.7QI SOPN    Inject 3 mg as directed once a week.   DULOXETINE (CYMBALTA) 60 MG CAPSULE  Take 60 mg by mouth daily.   FLUOXETINE (PROZAC) 20 MG CAPSULE    Take 20 mg by mouth daily.   HYDROCODONE-ACETAMINOPHEN (NORCO) 5-325 MG TABLET    Take 1 tablet by mouth every 6 (six) hours as needed for moderate pain.   LOSARTAN (COZAAR) 100 MG TABLET    Take 100 mg by mouth every morning.   METFORMIN (GLUCOPHAGE-XR) 500 MG 24 HR TABLET    Take 500 mg by mouth at bedtime.   NEXIUM 40 MG CAPSULE    Take 40 mg by mouth 2 (two) times daily before a meal.   POLYETHYLENE GLYCOL (MIRALAX / GLYCOLAX) 17 G PACKET    Take 17 g by mouth 2 (two) times daily.   PRAVASTATIN (PRAVACHOL) 80 MG TABLET    Take 80  mg by mouth daily.   QVAR REDIHALER 80 MCG/ACT INHALER    Inhale 1 puff into the lungs in the morning and at bedtime.   TIZANIDINE (ZANAFLEX) 2 MG TABLET    Take 1 tablet (2 mg total) by mouth every 8 (eight) hours as needed for muscle spasms.  Modified Medications   No medications on file  Discontinued Medications   No medications on file   Subjective: 67 Y O female with PMH as below including DM< Liver cirrhosis, Psoriatic arthritis who is here for regular fu for left knee PJI. She is on po suppression with doxycycline and amoxicillin. Recently admitted  1/12-1/15 for left femur neck # and had total hip arthroplasty done. Denies any concerns at the post operative site which has healed.   11/16/22 Continues to take po doxycycline and amoxicillin as prescribed. However, sh would like to be off the antibiotics if not needed anymore. She has no concerns in her left knee except mild stiffness when standing from sitting. Denies fevers, chills. Denies nausea, vomiting and diarrhea. Her inflammatory markers have been down trending and discussed to stop suppressive abtx to which she is agreeable.   Review of Systems: all systems reviewed with pertinent positives and negatives as listed above   Past Medical History:  Diagnosis Date   Anxiety    Arthritis    psoriatic arithritis, DDD    Asthma    Depression    Diabetes mellitus    type 2   Gastritis    Gastroparesis    GERD (gastroesophageal reflux disease)    Hernia    Hyperlipemia    Hyperlipemia    Hypertension    Liver cirrhosis (HCC)    Nausea and vomiting 01/06/2013   Neuropathy    Bil feet re: to Diabetes   Obesity    Osteoporosis    Plantar fasciitis    Stricture and stenosis of esophagus    Stroke ([place])    TIA (transient ischemic attack)    8-10 yrs ago, no problems since   Tuberculosis    tested positive 2011, no symptoms, was on medicine for 6 months   Past Surgical History:  Procedure Laterality Date   BREAST SURGERY  Bilateral    biopsies both breast   CARDIAC CATHETERIZATION  08/30/2010   CESAREAN SECTION     x 2    CHOLECYSTECTOMY     COLONOSCOPY     FOOT SURGERY Left    spur removal   HERNIA REPAIR     IR FLUORO GUIDE CV LINE RIGHT  04/12/2021   IR US GUIDE VASC ACCESS RIGHT  04/12/2021   JOINT REPLACEMENT     bilat knee  KNEE SURGERY Bilateral    x 2 joint knee replacements   KYPHOPLASTY N/A 03/28/2022   Procedure: LUMBAR ONE KYPHOPLASTY;  Surgeon: Phylliss Bob, MD;  Location: [facility];  Service: Orthopedics;  Laterality: N/A;  LUMBAR ONE KYPHOPLASTY   TOTAL HIP ARTHROPLASTY Left 10/26/2022   Procedure: TOTAL HIP ARTHROPLASTY ANTERIOR APPROACH;  Surgeon: Dorna Leitz, MD;  Location: [facility];  Service: Orthopedics;  Laterality: Left;   TOTAL KNEE REVISION Left 10/03/2021   Procedure: LEFT TOTAL KNEE REVISION POLY SWAP WITH IRRIGATION AND DEBRIDEMENT;  Surgeon: Melrose Nakayama, MD;  Location: [facility];  Service: Orthopedics;  Laterality: Left;   UPPER GASTROINTESTINAL ENDOSCOPY     WISDOM TOOTH EXTRACTION      Social History   Tobacco Use   Smoking status: Never   Smokeless tobacco: Never  Vaping Use   Vaping Use: Never used  Substance Use Topics   Alcohol use: Not Currently    Comment: socially   Drug use: No    Family History  Problem Relation Age of Onset   Diabetes Other        maternal aunts and uncles   Diabetes Father    Liver disease Father    Emphysema Father        smoked   Diabetes Sister    Diabetes Brother    Sarcoidosis Brother    Emphysema Mother        smoked   Asthma Mother    Asthma Brother    Colon cancer Neg Hx    Esophageal cancer Neg Hx    Stomach cancer Neg Hx    Rectal cancer Neg Hx     Allergies  Allergen Reactions   Lisinopril Cough    Health Maintenance  Topic Date Due   PAP SMEAR-Modifier  Never done   Zoster Vaccines- Shingrix (1 of 2) Never done   MAMMOGRAM  08/11/2012   Diabetic kidney evaluation - Urine ACR  02/15/2017    OPHTHALMOLOGY EXAM  11/02/2017   COVID-19 Vaccine (4 - 2023-24 season) 06/15/2022   Pneumonia Vaccine 51+ Years old (36 - PPSV23 or PCV20) 07/19/2022   DEXA SCAN  Never done   HEMOGLOBIN A1C  12/24/2022   FOOT EXAM  08/17/2023   Medicare Annual Wellness (AWV)  08/23/2023   Diabetic kidney evaluation - eGFR measurement  10/30/2023   COLONOSCOPY (Pts 45-75yr Insurance coverage will need to be confirmed)  11/28/2023   DTaP/Tdap/Td (4 - Td or Tdap) 02/19/2032   INFLUENZA VACCINE  Completed   Hepatitis C Screening  Completed   HIV Screening  Completed   HPV VACCINES  Aged Out    Objective: BP (!) 165/93   Pulse (!) 102   Temp 97.9 F (36.6 C) (Oral)   Wt 141 lb (64 kg)   LMP  (LMP Unknown)   SpO2 98%   BMI 27.54 kg/m    Physical Exam Constitutional:      Appearance: Normal appearance.  HENT:     Head: Normocephalic and atraumatic.      Mouth: Mucous membranes are moist.  Eyes:    Conjunctiva/sclera: Conjunctivae normal.     Pupils: Pupils are equal, round, and bilaterally equal  Cardiovascular:     Rate and Rhythm: Normal rate and regular rhythm.     Heart sounds: s1s2  Pulmonary:     Effort: Pulmonary effort is normal.     Breath sounds: Normal breath sounds.   Abdominal:     General: Non distended  Palpations: soft.   Musculoskeletal:        General: Normal range of motion. Left THA site has healed with no concerns for infection. Left TKA site with no concerns for infection, ambulatory  Skin:    General: Skin is warm and dry.     Comments:  Neurological:     General: grossly non focal     Mental Status: awake, alert and oriented to person, place, and time.   Psychiatric:        Mood and Affect: Mood normal.   Lab Results Lab Results  Component Value Date   WBC 8.9 10/29/2022   HGB 7.9 (L) 10/29/2022   HCT 26.5 (L) 10/29/2022   MCV 77.9 (L) 10/29/2022   PLT 219 10/29/2022    Lab Results  Component Value Date   CREATININE 1.09 (H) 10/29/2022    BUN 18 10/29/2022   NA 135 10/29/2022   K 4.5 10/29/2022   CL 105 10/29/2022   CO2 24 10/29/2022    Lab Results  Component Value Date   ALT 24 10/26/2022   AST 36 10/26/2022   ALKPHOS 57 10/26/2022   BILITOT 0.2 (L) 10/26/2022    Lab Results  Component Value Date   CHOL 139 06/25/2022   HDL 36 (L) 06/25/2022   LDLCALC 73 06/25/2022   TRIG 150 (H) 06/25/2022   CHOLHDL 3.9 06/25/2022   No results found for: "LABRPR", "RPRTITER" No results found for: "HIV1RNAQUANT", "HIV1RNAVL", "CD4TABS"   Assessment/Plan  # left knee PJI  - reviewed orthopedics notes ( [facility], 08/23/22) where she had aspiration of left knee with minimal normal appearing fluid with no concerns for infection. She had steroid injection in the Rt shoulder for adhesive capsulitis  - She has already received approx 1 year of PO suppressive abtx with clinical improvement as well as down-trending inflammatory markers  - Discussed with patient and will plan to stop abtx  - Fu in a month/as needed   # Medication monitoring  - no labs today as stopping abtx   # Psoriatic arthritis - On Cosentyx  - [facility]   # Liver cirrhosis  - Follow [facility]   # Elevated BP/HTN - discussed to fu with PCP  I have personally spent 41 minutes involved in face-to-face and non-face-to-face activities for this patient on the day of the visit. Professional time spent includes the following activities: Preparing to see the patient (review of tests), Obtaining and/or reviewing separately obtained history (admission/discharge record), Performing a medically appropriate examination and/or evaluation , Ordering medications/tests/procedures, referring and communicating with other health care professionals, Documenting clinical information in the EMR, Independently interpreting results (not separately reported), Communicating results to the patient/family/caregiver, Counseling and educating the patient/family/caregiver  and Care coordination (not separately reported).   Wilber Oliphant, [facility] 11/16/2022, 8:06 AM

## 2022-11-27 ENCOUNTER — Other Ambulatory Visit: Payer: Self-pay | Admitting: Orthopaedic Surgery

## 2022-12-13 NOTE — Progress Notes (Addendum)
COVID Vaccine received:  '[]'$  No '[x]'$  Yes Date of any COVID positive Test in last 90 days: None  PCP - Precious Haws MD Cardiologist - none Rheumatology- Gavin Pound, MD ID- Rosiland Oz, MD (Knee prosthetic joint infection)  Chest x-ray - 10-26-2022  1v  epic EKG -  10-26-2022  epic  Stress Test - 2015  epic ECHO - 06-26-2022  epic  Cardiac Cath - 08-30-2010   epic  PCR screen: '[]'$  Ordered & Completed                      '[]'$   No Order but Needs PROFEND                      '[x]'$   N/A for this surgery  Surgery Plan:  '[x]'$  Ambulatory                            '[]'$  Outpatient in bed                            '[]'$  Admit  Anesthesia:    '[]'$  General  '[]'$  Spinal                           '[x]'$   Choice '[]'$   MAC  Pacemaker / ICD device '[x]'$  No '[]'$  Yes        Device order form faxed '[x]'$  No    '[]'$   Yes      Faxed to:  Spinal Cord Stimulator:'[x]'$  No '[]'$  Yes      (Remind patient to bring remote DOS) Other Implants:   History of Sleep Apnea? '[x]'$  No '[]'$  Yes   CPAP used?- '[x]'$  No '[]'$  Yes    Does the patient monitor blood sugar? '[]'$  No '[x]'$  Yes  '[]'$  N/A  Patient has: '[]'$  Pre-DM   '[]'$  DM1  '[x]'$   DM2 Does patient have a Freestyle Libre or Dexacom? '[x]'$  No '[]'$  Yes   Fasting Blood Sugar Ranges-  Checks Blood Sugar __5   times a week  Last dose of GLP1 agonist-   Trulicity q Tuesday  GLP1 instructions: no injection on DOS, resume injections on 12-25-22.  Other Diabetic medications/ instructions:  METFORMIN- 500 mg q hs   Blood Thinner / Instructions:None Aspirin Instructions:  ASA 81 mg: stopped taking it on Monday 12-10-2022.   ERAS Protocol Ordered: '[]'$  No  '[x]'$  Yes PRE-SURGERY '[]'$  ENSURE  '[x]'$  G2  Patient is to be NPO after: 0945  Activity level: Patient can not climb a flight of stairs without difficulty; '[x]'$  No CP but would have _SOB.  Patient can perform ADLs without assistance.   Anesthesia review: DM2, HTN, asthma GERD, anxiety, Non-alcoholic cirrhosis, [id number], Hx TB (Treated x 6 months in 2011), Hx CVA,  anemia, psoriatic arthritis  Patient denies shortness of breath, fever, cough and chest pain at PAT appointment.  Patient verbalized understanding and agreement to the Pre-Surgical Instructions that were given to them at this PAT appointment. Patient was also educated of the need to review these PAT instructions again prior to his/her surgery.I reviewed the appropriate phone numbers to call if they have any and questions or concerns.

## 2022-12-13 NOTE — Patient Instructions (Addendum)
SURGICAL WAITING ROOM VISITATION Patients having surgery or a procedure may have no more than 2 support people in the waiting area - these visitors may rotate in the visitor waiting room.   Due to an increase in RSV and influenza rates and associated hospitalizations, children ages 58 and under may not visit patients in [place]. If the patient needs to stay at the hospital during part of their recovery, the visitor guidelines for inpatient rooms apply.  PRE-OP VISITATION  Pre-op nurse will coordinate an appropriate time for 1 support person to accompany the patient in pre-op.  This support person may not rotate.  This visitor will be contacted when the time is appropriate for the visitor to come back in the pre-op area.  Please refer to the [facility] website for the visitor guidelines for Inpatients (after your surgery is over and you are in a regular room).  You are not required to quarantine at this time prior to your surgery. However, you must do this: Hand Hygiene often Do NOT share personal items Notify your provider if you are in close contact with someone who has COVID or you develop fever 100.4 or greater, new onset of sneezing, cough, sore throat, shortness of breath or body aches.  If you test positive for Covid or have been in contact with anyone that has tested positive in the last 10 days please notify you surgeon.    Your procedure is scheduled on:  Tuesday  December 18, 2022  Report to [facility] Main Entrance: [place] entrance where the [employer] is available.   Report to admitting at:  10:15   AM  +++++Call this number if you have any questions or problems the morning of surgery [phone]  Do not eat food after Midnight the night prior to your surgery/procedure.  After Midnight you may have the following liquids until  09:45 AM DAY OF SURGERY  Clear Liquid Diet Water Black Coffee (sugar ok, NO MILK/CREAM OR CREAMERS)  Tea (sugar ok, NO  MILK/CREAM OR CREAMERS) regular and decaf                             Plain Jell-O  with no fruit (NO RED)                                           Fruit ices (not with fruit pulp, NO RED)                                     Popsicles (NO RED)                                                                  Juice: apple, WHITE grape, WHITE cranberry Sports drinks like Gatorade or Powerade (NO RED)                    The day of surgery:  Drink ONE (1) Pre-Surgery G2 at  09:45  AM the morning of surgery. Drink in one sitting.  Do not sip.  This drink was given to you during your hospital pre-op appointment visit. Nothing else to drink after completing the Pre-Surgery G2 : No candy, chewing gum or throat lozenges.    FOLLOW ANY ADDITIONAL PRE OP INSTRUCTIONS YOU RECEIVED FROM YOUR SURGEON'S OFFICE!!!   Oral Hygiene is also important to reduce your risk of infection.        Remember - BRUSH YOUR TEETH THE MORNING OF SURGERY WITH YOUR REGULAR TOOTHPASTE  Do NOT smoke after Midnight the night before surgery.  Diabetic Medication:  TRULICITY- no injection on Tuesday 12-18-22, resume injections on 12-25-22.  METFORMIN- Take as usual on Monday 12-17-2022. DO NOT take Metformin on your day of surgery  12-18-22.   Take ONLY these medicines the morning of surgery with A SIP OF WATER: amlodipine, duloxetine (Cymbalta), Nexium. You may use your inhalers if needed.   You may not have any metal on your body including hair pins, jewelry, and body piercing  Do not wear make-up, lotions, powders, perfumes or deodorant  Do not wear nail polish including gel and S&S, artificial / acrylic nails, or any other type of covering on natural nails including finger and toenails. If you have artificial nails, gel coating, etc., that needs to be removed by a nail salon, Please have this removed prior to surgery. Not doing so may mean that your surgery could be cancelled or delayed if the Surgeon or anesthesia staff  feels like they are unable to monitor you safely.   Do not shave 48 hours prior to surgery to avoid nicks in your skin which may contribute to postoperative infections.   Contacts, Hearing Aids, dentures or bridgework may not be worn into surgery. DENTURES WILL BE REMOVED PRIOR TO SURGERY PLEASE DO NOT APPLY "Poly grip" OR ADHESIVES!!!  Patients discharged on the day of surgery will not be allowed to drive home.  Someone NEEDS to stay with you for the first 24 hours after anesthesia.  Do not bring your home medications to the hospital. The Pharmacy will dispense medications listed on your medication list to you during your admission in the Hospital.  Special Instructions: Bring a copy of your healthcare power of attorney and living will documents the day of surgery, if you wish to have them scanned into your [facility] Records- EPIC  Please read over the following fact sheets you were given: IF YOU HAVE QUESTIONS ABOUT YOUR PRE-OP INSTRUCTIONS, PLEASE CALL [phone]  ([place])   [facility] - Preparing for Surgery Before surgery, you can play an important role.  Because skin is not sterile, your skin needs to be as free of germs as possible.  You can reduce the number of germs on your skin by washing with CHG (chlorahexidine gluconate) soap before surgery.  CHG is an antiseptic cleaner which kills germs and bonds with the skin to continue killing germs even after washing. Please DO NOT use if you have an allergy to CHG or antibacterial soaps.  If your skin becomes reddened/irritated stop using the CHG and inform your nurse when you arrive at Short Stay. Do not shave (including legs and underarms) for at least 48 hours prior to the first CHG shower.  You may shave your face/neck.  Please follow these instructions carefully:  1.  Shower with CHG Soap the night before surgery and the  morning of surgery.  2.  If you choose to wash your hair, wash your hair first as usual with your normal   shampoo.  3.  After you shampoo, rinse your hair and body thoroughly to remove the shampoo.                             4.  Use CHG as you would any other liquid soap.  You can apply chg directly to the skin and wash.  Gently with a scrungie or clean washcloth.  5.  Apply the CHG Soap to your body ONLY FROM THE NECK DOWN.   Do not use on face/ open                           Wound or open sores. Avoid contact with eyes, ears mouth and genitals (private parts).                       Wash face,  Genitals (private parts) with your normal soap.             6.  Wash thoroughly, paying special attention to the area where your  surgery  will be performed.  7.  Thoroughly rinse your body with warm water from the neck down.  8.  DO NOT shower/wash with your normal soap after using and rinsing off the CHG Soap.            9.  Pat yourself dry with a clean towel.            10.  Wear clean pajamas.            11.  Place clean sheets on your bed the night of your first shower and do not  sleep with pets.  ON THE DAY OF SURGERY : Do not apply any lotions/deodorants the morning of surgery.  Please wear clean clothes to the hospital/surgery center.    FAILURE TO FOLLOW THESE INSTRUCTIONS MAY RESULT IN THE CANCELLATION OF YOUR SURGERY  PATIENT SIGNATURE_________________________________  NURSE SIGNATURE__________________________________  ________________________________________________________________________       Carrie [name]    An incentive spirometer is a tool that can help keep your lungs clear and active. This tool measures how well you are filling your lungs with each breath. Taking long deep breaths may help reverse or decrease the chance of developing breathing (pulmonary) problems (especially infection) following: A long period of time when you are unable to move or be active. BEFORE THE PROCEDURE  If the spirometer includes an indicator to show your best effort, your nurse or  respiratory therapist will set it to a desired goal. If possible, sit up straight or lean slightly forward. Try not to slouch. Hold the incentive spirometer in an upright position. INSTRUCTIONS FOR USE  Sit on the edge of your bed if possible, or sit up as far as you can in bed or on a chair. Hold the incentive spirometer in an upright position. Breathe out normally. Place the mouthpiece in your mouth and seal your lips tightly around it. Breathe in slowly and as deeply as possible, raising the piston or the ball toward the top of the column. Hold your breath for 3-5 seconds or for as long as possible. Allow the piston or ball to fall to the bottom of the column. Remove the mouthpiece from your mouth and breathe out normally. Rest for a few seconds and repeat Steps 1 through 7 at least 10 times every 1-2 hours when you are awake. Take your time and  take a few normal breaths between deep breaths. The spirometer may include an indicator to show your best effort. Use the indicator as a goal to work toward during each repetition. After each set of 10 deep breaths, practice coughing to be sure your lungs are clear. If you have an incision (the cut made at the time of surgery), support your incision when coughing by placing a pillow or rolled up towels firmly against it. Once you are able to get out of bed, walk around indoors and cough well. You may stop using the incentive spirometer when instructed by your caregiver.  RISKS AND COMPLICATIONS Take your time so you do not get dizzy or light-headed. If you are in pain, you may need to take or ask for pain medication before doing incentive spirometry. It is harder to take a deep breath if you are having pain. AFTER USE Rest and breathe slowly and easily. It can be helpful to keep track of a log of your progress. Your caregiver can provide you with a simple table to help with this. If you are using the spirometer at home, follow these  instructions: [place] IF:  You are having difficultly using the spirometer. You have trouble using the spirometer as often as instructed. Your pain medication is not giving enough relief while using the spirometer. You develop fever of 100.5 F (38.1 C) or higher.                                                                                                    SEEK IMMEDIATE MEDICAL CARE IF:  You cough up bloody sputum that had not been present before. You develop fever of 102 F (38.9 C) or greater. You develop worsening pain at or near the incision site. MAKE SURE YOU:  Understand these instructions. Will watch your condition. Will get help right away if you are not doing well or get worse. Document Released: 02/11/2007 Document Revised: 12/24/2011 Document Reviewed: 04/14/2007 [facility] Patient Information 2014 [place], [place].

## 2022-12-14 ENCOUNTER — Encounter (HOSPITAL_COMMUNITY): Payer: Self-pay

## 2022-12-14 ENCOUNTER — Ambulatory Visit (INDEPENDENT_AMBULATORY_CARE_PROVIDER_SITE_OTHER): Payer: 59 | Admitting: Infectious Diseases

## 2022-12-14 ENCOUNTER — Encounter (HOSPITAL_COMMUNITY)
Admission: RE | Admit: 2022-12-14 | Discharge: 2022-12-14 | Disposition: A | Discharge status: Home / Self Care | Payer: 59 | Source: Ambulatory Visit | Attending: Orthopaedic Surgery | Admitting: Orthopaedic Surgery

## 2022-12-14 ENCOUNTER — Other Ambulatory Visit: Payer: Self-pay

## 2022-12-14 VITALS — BP 165/85 | HR 83 | Temp 97.9°F | Ht 60.0 in | Wt 140.0 lb

## 2022-12-14 VITALS — BP 160/77 | HR 90 | Temp 98.3°F | Resp 16 | Ht 60.0 in | Wt 140.0 lb

## 2022-12-14 DIAGNOSIS — I10 Essential (primary) hypertension: Secondary | ICD-10-CM | POA: Diagnosis not present

## 2022-12-14 DIAGNOSIS — E119 Type 2 diabetes mellitus without complications: Secondary | ICD-10-CM | POA: Diagnosis not present

## 2022-12-14 DIAGNOSIS — L4050 Arthropathic psoriasis, unspecified: Secondary | ICD-10-CM | POA: Diagnosis not present

## 2022-12-14 DIAGNOSIS — T8454XD 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, subsequent encounter: Secondary | ICD-10-CM

## 2022-12-14 DIAGNOSIS — T8459XD Infection and inflammatory reaction due to other internal joint prosthesis, subsequent encounter: Secondary | ICD-10-CM

## 2022-12-14 DIAGNOSIS — Z01812 Encounter for preprocedural laboratory examination: Secondary | ICD-10-CM | POA: Insufficient documentation

## 2022-12-14 HISTORY — DX: Chronic kidney disease, unspecified: N18.9

## 2022-12-14 LAB — COMPREHENSIVE METABOLIC PANEL
ALT: 8 U/L (ref 0–44)
AST: 16 U/L (ref 15–41)
Albumin: 4 g/dL (ref 3.5–5.0)
Alkaline Phosphatase: 117 U/L (ref 38–126)
Anion gap: 9 (ref 5–15)
BUN: 24 mg/dL — ABNORMAL HIGH (ref 8–23)
CO2: 25 mmol/L (ref 22–32)
Calcium: 8.9 mg/dL (ref 8.9–10.3)
Chloride: 106 mmol/L (ref 98–111)
Creatinine, Ser: 1.24 mg/dL — ABNORMAL HIGH (ref 0.44–1.00)
GFR, Estimated: 48 mL/min — ABNORMAL LOW (ref >60)
Glucose, Bld: 203 mg/dL — ABNORMAL HIGH (ref 70–99)
Potassium: 4.4 mmol/L (ref 3.5–5.1)
Sodium: 140 mmol/L (ref 135–145)
Total Bilirubin: 0.5 mg/dL (ref 0.3–1.2)
Total Protein: 8.5 g/dL — ABNORMAL HIGH (ref 6.5–8.1)

## 2022-12-14 LAB — CBC
HCT: 31.9 % — ABNORMAL LOW (ref 36.0–46.0)
Hemoglobin: 9.3 g/dL — ABNORMAL LOW (ref 12.0–15.0)
MCH: 21.9 pg — ABNORMAL LOW (ref 26.0–34.0)
MCHC: 29.2 g/dL — ABNORMAL LOW (ref 30.0–36.0)
MCV: 75.2 fL — ABNORMAL LOW (ref 80.0–100.0)
Platelets: 346 10*3/uL (ref 150–400)
RBC: 4.24 MIL/uL (ref 3.87–5.11)
RDW: 17.6 % — ABNORMAL HIGH (ref 11.5–15.5)
WBC: 7.6 10*3/uL (ref 4.0–10.5)
nRBC: 0 % (ref 0.0–0.2)

## 2022-12-14 LAB — GLUCOSE, CAPILLARY: Glucose-Capillary: 210 mg/dL — ABNORMAL HIGH (ref 70–99)

## 2022-12-14 NOTE — H&P (Signed)
Carrie [name] is an 67 y.o. female.   Chief Complaint: right shoulder pain HPI: Dominic continues with terrible pain in the right shoulder.  We set her up with a shoulder arthroscopy in late January which we obviously postponed due to the hip fracture.  She has trouble using her arm and cannot sleep at night.  We know from an MRI scan that she has a bursal aspect partial tear and biceps tendinosis.  Past Medical History:  Diagnosis Date   Anxiety    Arthritis    psoriatic arithritis, DDD    Asthma    Depression    Diabetes mellitus    type 2   Gastritis    Gastroparesis    GERD (gastroesophageal reflux disease)    Hernia    Hyperlipemia    Hyperlipemia    Hypertension    Liver cirrhosis (HCC)    Nausea and vomiting 01/06/2013   Neuropathy    Bil feet re: to Diabetes   Obesity    Osteoporosis    Plantar fasciitis    Stricture and stenosis of esophagus    Stroke ([place])    TIA (transient ischemic attack)    8-10 yrs ago, no problems since   Tuberculosis    tested positive 2011, no symptoms, was on medicine for 6 months    Past Surgical History:  Procedure Laterality Date   BREAST SURGERY Bilateral    biopsies both breast   CARDIAC CATHETERIZATION  08/30/2010   CESAREAN SECTION     x 2    CHOLECYSTECTOMY     COLONOSCOPY     FOOT SURGERY Left    spur removal   HERNIA REPAIR     IR FLUORO GUIDE CV LINE RIGHT  04/12/2021   IR US GUIDE VASC ACCESS RIGHT  04/12/2021   JOINT REPLACEMENT     bilat knee    KNEE SURGERY Bilateral    x 2 joint knee replacements   KYPHOPLASTY N/A 03/28/2022   Procedure: LUMBAR ONE KYPHOPLASTY;  Surgeon: Phylliss Bob, MD;  Location: [facility];  Service: Orthopedics;  Laterality: N/A;  LUMBAR ONE KYPHOPLASTY   TOTAL HIP ARTHROPLASTY Left 10/26/2022   Procedure: TOTAL HIP ARTHROPLASTY ANTERIOR APPROACH;  Surgeon: Dorna Leitz, MD;  Location: [facility];  Service: Orthopedics;  Laterality: Left;   TOTAL KNEE REVISION Left 10/03/2021   Procedure: LEFT  TOTAL KNEE REVISION POLY SWAP WITH IRRIGATION AND DEBRIDEMENT;  Surgeon: Melrose Nakayama, MD;  Location: [facility];  Service: Orthopedics;  Laterality: Left;   UPPER GASTROINTESTINAL ENDOSCOPY     WISDOM TOOTH EXTRACTION      Family History  Problem Relation Age of Onset   Diabetes Other        maternal aunts and uncles   Diabetes Father    Liver disease Father    Emphysema Father        smoked   Diabetes Sister    Diabetes Brother    Sarcoidosis Brother    Emphysema Mother        smoked   Asthma Mother    Asthma Brother    Colon cancer Neg Hx    Esophageal cancer Neg Hx    Stomach cancer Neg Hx    Rectal cancer Neg Hx    Social History:  reports that she has never smoked. She has never used smokeless tobacco. She reports that she does not currently use alcohol. She reports that she does not use drugs.  Allergies:  Allergies  Allergen Reactions  Lisinopril Cough    No medications prior to admission.    No results found for this or any previous visit (from the past 48 hour(s)). No results found.  Review of Systems  Musculoskeletal:  Positive for arthralgias.       Right shoulder pain  All other systems reviewed and are negative.   There were no vitals taken for this visit. Physical Exam Constitutional:      Appearance: Normal appearance.  HENT:     Head: Normocephalic and atraumatic.     Nose: Nose normal.     Mouth/Throat:     Pharynx: Oropharynx is clear.  Eyes:     Extraocular Movements: Extraocular movements intact.  Pulmonary:     Effort: Pulmonary effort is normal.  Abdominal:     Palpations: Abdomen is soft.  Musculoskeletal:     Cervical back: Normal range of motion.     Comments: Right shoulder motion is fairly good although she lacks a bit of forward flexion.  She has pain in positions of impingement.  She maintains good cuff strength though it is very painful in resisted forward flexion.  She has a painful arc as she brings her arm up and down.  I  do not appreciate any AC pain.  Skin:    General: Skin is warm and dry.  Neurological:     General: No focal deficit present.     Mental Status: She is alert and oriented to person, place, and time.  Psychiatric:        Mood and Affect: Mood normal.        Behavior: Behavior normal.        Thought Content: Thought content normal.      Assessment/Plan Assessment: Right shoulder partial rotator cuff tear by MRI 2023 injected 06/20/22.  Plan: Jiliana would like to go forward with her shoulder arthroscopy potentially next month.  We obviously will need clearance through her various internal medicine specialists.  I again reviewed risk of anesthesia, infection, DVT.  The plan is still to perform an acromioplasty and debridement with potentially a biceps tenolysis depending on findings.  Larwance Sachs Rolande Moe, PA-C 12/14/2022, 8:39 AM

## 2022-12-15 LAB — HEMOGLOBIN A1C
Hgb A1c MFr Bld: 6.3 % — ABNORMAL HIGH (ref 4.8–5.6)
Mean Plasma Glucose: 134 mg/dL

## 2022-12-15 NOTE — Progress Notes (Signed)
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   Hepatic cirrhosis ([place]) 04/06/2022   AKI superimposed on CKD IIIa 03/11/2022   Lightheadedness 03/11/2022   Anxiety and depression 03/11/2022   DM2 (diabetes mellitus, type 2) ([place]) 03/11/2022   Microcytic anemia 03/11/2022   Elevated d-dimer 03/11/2022   Medication monitoring encounter 11/07/2021   Psoriatic arthritis ([place]) 11/07/2021   Pressure injury of skin 04/13/2021   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[place]) 03/16/2020   Bowel obstruction ([place]) 02/08/2020   Leukocytosis 02/08/2020   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   Psoriasis 07/08/2017   Mild neurocognitive disorder 10/18/2016   Diabetes ([place]) 02/16/2016   Burn from the sun 02/25/2015   Chest pain 12/04/2013   Diabetes mellitus type II, uncontrolled 12/04/2013   Benign essential HTN 12/04/2013   Hypoglycemia 12/04/2013   Acute pharyngitis 09/11/2013   Candidiasis of mouth 09/11/2013   Essential hypertension 04/21/2013   Gastroparesis 02/25/2013   Cough 02/25/2013   Nausea and vomiting 01/06/2013   Nausea alone 05/15/2011   STRICTURE AND STENOSIS OF ESOPHAGUS 10/21/2009   Hyperlipidemia 01/09/2008   OBESITY 01/09/2008   Asthma 01/09/2008   GERD with esophageal stricture and stenosis s/p dilation  01/09/2008   HERNIA, VENTRAL 01/09/2008   ARTHRITIS 01/09/2008   Depression 03/25/2007   ANXIETY  03/25/2007   Current Outpatient Medications on File Prior to Visit  Medication Sig Dispense Refill   Accu-Chek Softclix Lancets lancets Use as instructed to check blood sugar 2 times daily Dx E11.9 200 each 0   albuterol (VENTOLIN HFA) 108 (90 Base) MCG/ACT inhaler Inhale 2 puffs into the lungs every 4 (four) hours as needed for wheezing or shortness of breath (or coughing).     amLODipine (NORVASC) 2.5 MG tablet Take 2.5 mg by mouth daily.     Blood Glucose Monitoring Suppl (ACCU-CHEK GUIDE ME) w/Device KIT Use as instructed to check blood sugar 2 times daily Dx E11.9 1 kit 0   busPIRone (BUSPAR) 5 MG tablet Take 10 mg by mouth at bedtime.     Calcium Carb-Cholecalciferol (CALCIUM CARBONATE-VITAMIN D3 PO) Take 1 tablet by mouth daily.     celecoxib (CELEBREX) 200 MG capsule Take 1 capsule (200 mg total) by mouth 2 (two) times daily. 60 capsule 0   COSENTYX SENSOREADY, 300 MG, 150 MG/ML SOAJ Inject 300 mg into the skin every 30 (thirty) days.     docusate sodium (COLACE) 100 MG capsule Take 1 capsule (100 mg total) by mouth 2 (two) times daily. (Patient taking differently: Take 100 mg by mouth daily as needed for mild constipation.) 10 capsule 0   Dulaglutide (TRULICITY) 3 0000000 SOPN Inject 3 mg as directed once a week. (Patient taking differently: Inject 3 mg as directed every Tuesday.) 2 mL 1   DULoxetine (CYMBALTA) 60 MG capsule Take 60 mg by mouth daily.     FLUoxetine (PROZAC) 20 MG capsule Take 20 mg by  mouth daily.     losartan (COZAAR) 100 MG tablet Take 100 mg by mouth every morning.     metFORMIN (GLUCOPHAGE-XR) 500 MG 24 hr tablet Take 500 mg by mouth at bedtime.     NEXIUM 40 MG capsule Take 40 mg by mouth 2 (two) times daily before a meal.     pravastatin (PRAVACHOL) 80 MG tablet Take 80 mg by mouth daily.     QVAR REDIHALER 80 MCG/ACT inhaler Inhale 1 puff into the lungs in the morning and at bedtime.     tiZANidine (ZANAFLEX) 2 MG tablet Take 1 tablet (2 mg total) by mouth  every 8 (eight) hours as needed for muscle spasms. 40 tablet 0   HYDROcodone-acetaminophen (NORCO) 5-325 MG tablet Take 1 tablet by mouth every 6 (six) hours as needed for moderate pain. 40 tablet 0   No current facility-administered medications on file prior to visit.   Subjective: 67 Y O female with PMH as below including DM, Liver cirrhosis, Psoriatic arthritis who is here for regular fu for left knee PJI. PO suppression with antibiotics was stopped last clinic visit 2/2 after completing 1 year of suppression. She has been doing well off abtx with no concerns in her left knee. She is planned to undergo shoulder arthroscopy for RT shoulder partial rotator cuff tear. She has no complaints otherwise.   Review of Systems: all systems reviewed with pertinent positives and negatives as listed above   Past Medical History:  Diagnosis Date   Anxiety    Arthritis    psoriatic arithritis, DDD    Asthma    Chronic kidney disease    CKD3a   Depression    Diabetes mellitus    type 2   Gastritis    Gastroparesis    GERD (gastroesophageal reflux disease)    Hernia    Hyperlipemia    Hyperlipemia    Hypertension    Liver cirrhosis (HCC)    Nausea and vomiting 01/06/2013   Neuropathy    Bil feet re: to Diabetes   Obesity    Osteoporosis    Plantar fasciitis    Stricture and stenosis of esophagus    Stroke ([place])    TIA (transient ischemic attack)    8-10 yrs ago, no problems since   Tuberculosis    tested positive 2011, no symptoms, was on medicine for 6 months   Past Surgical History:  Procedure Laterality Date   BREAST SURGERY Bilateral    biopsies both breast   CARDIAC CATHETERIZATION  08/30/2010   CESAREAN SECTION     x 2    CHOLECYSTECTOMY     COLONOSCOPY     FOOT SURGERY Left    spur removal   HERNIA REPAIR     umbilical hernia repaired at the same time as cholecystectomy   IR FLUORO GUIDE CV LINE RIGHT  04/12/2021   IR US GUIDE VASC ACCESS RIGHT  04/12/2021   JOINT  REPLACEMENT     bilat knee    KNEE SURGERY Bilateral    x 2 joint knee replacements   KYPHOPLASTY N/A 03/28/2022   Procedure: LUMBAR ONE KYPHOPLASTY;  Surgeon: Phylliss Bob, MD;  Location: [facility];  Service: Orthopedics;  Laterality: N/A;  LUMBAR ONE KYPHOPLASTY   TOTAL HIP ARTHROPLASTY Left 10/26/2022   Procedure: TOTAL HIP ARTHROPLASTY ANTERIOR APPROACH;  Surgeon: Dorna Leitz, MD;  Location: [facility];  Service: Orthopedics;  Laterality: Left;   TOTAL KNEE REVISION Left 10/03/2021   Procedure: LEFT TOTAL  KNEE REVISION POLY SWAP WITH IRRIGATION AND DEBRIDEMENT;  Surgeon: Melrose Nakayama, MD;  Location: [facility];  Service: Orthopedics;  Laterality: Left;   UPPER GASTROINTESTINAL ENDOSCOPY     WISDOM TOOTH EXTRACTION      Social History   Tobacco Use   Smoking status: Never   Smokeless tobacco: Never  Vaping Use   Vaping Use: Never used  Substance Use Topics   Alcohol use: Not Currently    Comment: socially   Drug use: No    Family History  Problem Relation Age of Onset   Diabetes Other        maternal aunts and uncles   Diabetes Father    Liver disease Father    Emphysema Father        smoked   Diabetes Sister    Diabetes Brother    Sarcoidosis Brother    Emphysema Mother        smoked   Asthma Mother    Asthma Brother    Colon cancer Neg Hx    Esophageal cancer Neg Hx    Stomach cancer Neg Hx    Rectal cancer Neg Hx     Allergies  Allergen Reactions   Lisinopril Cough    Health Maintenance  Topic Date Due   Zoster Vaccines- Shingrix (1 of 2) Never done   PAP SMEAR-Modifier  Never done   MAMMOGRAM  08/11/2012   Diabetic kidney evaluation - Urine ACR  02/15/2017   OPHTHALMOLOGY EXAM  11/02/2017   COVID-19 Vaccine (4 - 2023-24 season) 06/15/2022   Pneumonia Vaccine 37+ Years old (110 of 3 - PPSV23 or PCV20) 07/19/2022   HEMOGLOBIN A1C  06/16/2023   FOOT EXAM  08/17/2023   Medicare Annual Wellness (AWV)  08/23/2023   COLONOSCOPY (Pts 45-18yr Insurance coverage  will need to be confirmed)  11/28/2023   Diabetic kidney evaluation - eGFR measurement  12/14/2023   DTaP/Tdap/Td (4 - Td or Tdap) 02/19/2032   INFLUENZA VACCINE  Completed   DEXA SCAN  Completed   Hepatitis C Screening  Completed   HIV Screening  Completed   HPV VACCINES  Aged Out    Objective: BP (!) 165/85   Pulse 83   Temp 97.9 F (36.6 C) (Temporal)   Ht 5' (1.524 m)   Wt 140 lb (63.5 kg)   LMP  (LMP Unknown)   SpO2 99%   BMI 27.34 kg/m    Physical Exam Constitutional:      Appearance: Normal appearance.  HENT:     Head: Normocephalic and atraumatic.      Mouth: Mucous membranes are moist.  Eyes:    Conjunctiva/sclera: Conjunctivae normal.     Pupils: Pupils are equal, round, and bilaterally equal  Cardiovascular:     Rate and Rhythm: Normal rate and regular rhythm.     Heart sounds: s1s2  Pulmonary:     Effort: Pulmonary effort is normal.     Breath sounds: Normal breath sounds.   Abdominal:     General: Non distended     Palpations: soft.   Musculoskeletal:        General: Normal range of motion. Left THA site has healed with no concerns for infection. Left TKA site with no concerns for infection, ambulatory  Skin:    General: Skin is warm and dry.     Comments:  Neurological:     General: grossly non focal     Mental Status: awake, alert and oriented to person, place, and time.  Psychiatric:        Mood and Affect: Mood normal.   Lab Results Lab Results  Component Value Date   WBC 7.6 12/14/2022   HGB 9.3 (L) 12/14/2022   HCT 31.9 (L) 12/14/2022   MCV 75.2 (L) 12/14/2022   PLT 346 12/14/2022    Lab Results  Component Value Date   CREATININE 1.24 (H) 12/14/2022   BUN 24 (H) 12/14/2022   NA 140 12/14/2022   K 4.4 12/14/2022   CL 106 12/14/2022   CO2 25 12/14/2022    Lab Results  Component Value Date   ALT 8 12/14/2022   AST 16 12/14/2022   ALKPHOS 117 12/14/2022   BILITOT 0.5 12/14/2022    Lab Results  Component Value Date    CHOL 139 06/25/2022   HDL 36 (L) 06/25/2022   LDLCALC 73 06/25/2022   TRIG 150 (H) 06/25/2022   CHOLHDL 3.9 06/25/2022   No results found for: "LABRPR", "RPRTITER" No results found for: "HIV1RNAQUANT", "HIV1RNAVL", "CD4TABS"   Assessment/Plan  # left knee PJI  - s/p completion of initial prolonged IV abtx followed by PO suppression for a year - Doing well off after stopping abtx 11/16/22 -Fu as needed   # Psoriatic arthritis - On Cosentyx  - [facility]   # Liver cirrhosis  - Follow [facility]   I have personally spent 31 minutes involved in face-to-face and non-face-to-face activities for this patient on the day of the visit. Professional time spent includes the following activities: Preparing to see the patient (review of tests), Obtaining and/or reviewing separately obtained history (admission/discharge record), Performing a medically appropriate examination and/or evaluation , Ordering medications/tests/procedures, referring and communicating with other health care professionals, Documenting clinical information in the EMR, Independently interpreting results (not separately reported), Communicating results to the patient/family/caregiver, Counseling and educating the patient/family/caregiver and Care coordination (not separately reported).   Wilber Oliphant, [facility] 12/15/2022, 7:36 AM

## 2022-12-18 ENCOUNTER — Encounter (HOSPITAL_COMMUNITY): Payer: Self-pay | Admitting: Orthopaedic Surgery

## 2022-12-18 ENCOUNTER — Other Ambulatory Visit: Payer: Self-pay

## 2022-12-18 ENCOUNTER — Ambulatory Visit (HOSPITAL_COMMUNITY): Payer: 59 | Admitting: Physician Assistant

## 2022-12-18 ENCOUNTER — Ambulatory Visit (HOSPITAL_BASED_OUTPATIENT_CLINIC_OR_DEPARTMENT_OTHER): Payer: 59 | Admitting: Anesthesiology

## 2022-12-18 ENCOUNTER — Encounter (HOSPITAL_COMMUNITY)
Admission: RE | Disposition: A | Discharge status: Home / Self Care | Payer: Self-pay | Source: Home / Self Care | Attending: Orthopaedic Surgery

## 2022-12-18 ENCOUNTER — Ambulatory Visit (HOSPITAL_COMMUNITY)
Admission: RE | Admit: 2022-12-18 | Discharge: 2022-12-18 | Disposition: A | Discharge status: Home / Self Care | Payer: 59 | Attending: Orthopaedic Surgery | Admitting: Orthopaedic Surgery

## 2022-12-18 DIAGNOSIS — D649 Anemia, unspecified: Secondary | ICD-10-CM | POA: Diagnosis not present

## 2022-12-18 DIAGNOSIS — Z7984 Long term (current) use of oral hypoglycemic drugs: Secondary | ICD-10-CM | POA: Diagnosis not present

## 2022-12-18 DIAGNOSIS — M75111 Incomplete rotator cuff tear or rupture of right shoulder, not specified as traumatic: Secondary | ICD-10-CM | POA: Diagnosis not present

## 2022-12-18 DIAGNOSIS — E1151 Type 2 diabetes mellitus with diabetic peripheral angiopathy without gangrene: Secondary | ICD-10-CM | POA: Diagnosis not present

## 2022-12-18 DIAGNOSIS — D759 Disease of blood and blood-forming organs, unspecified: Secondary | ICD-10-CM | POA: Diagnosis not present

## 2022-12-18 DIAGNOSIS — I119 Hypertensive heart disease without heart failure: Secondary | ICD-10-CM

## 2022-12-18 DIAGNOSIS — E119 Type 2 diabetes mellitus without complications: Secondary | ICD-10-CM

## 2022-12-18 DIAGNOSIS — I10 Essential (primary) hypertension: Secondary | ICD-10-CM | POA: Insufficient documentation

## 2022-12-18 DIAGNOSIS — J45909 Unspecified asthma, uncomplicated: Secondary | ICD-10-CM | POA: Diagnosis not present

## 2022-12-18 DIAGNOSIS — M75101 Unspecified rotator cuff tear or rupture of right shoulder, not specified as traumatic: Secondary | ICD-10-CM

## 2022-12-18 DIAGNOSIS — I739 Peripheral vascular disease, unspecified: Secondary | ICD-10-CM

## 2022-12-18 HISTORY — PX: SHOULDER ARTHROSCOPY: SHX128

## 2022-12-18 LAB — GLUCOSE, CAPILLARY
Glucose-Capillary: 143 mg/dL — ABNORMAL HIGH (ref 70–99)
Glucose-Capillary: 140 mg/dL — ABNORMAL HIGH (ref 70–99)

## 2022-12-18 SURGERY — ARTHROSCOPY, SHOULDER
Anesthesia: Regional | Site: Shoulder | Laterality: Right

## 2022-12-18 MED ORDER — DEXAMETHASONE SODIUM PHOSPHATE 10 MG/ML IJ SOLN
INTRAMUSCULAR | Status: DC | PRN
  Administered 2022-12-18: 10 mg via INTRAVENOUS

## 2022-12-18 MED ORDER — ONDANSETRON HCL 4 MG/2ML IJ SOLN
INTRAMUSCULAR | Status: AC
  Filled 2022-12-18: qty 2

## 2022-12-18 MED ORDER — HYDROCODONE-ACETAMINOPHEN 5-325 MG PO TABS: 1.0000 | ORAL_TABLET | Freq: Four times a day (QID) | ORAL | 0 refills | Status: DC | PRN

## 2022-12-18 MED ORDER — EPINEPHRINE PF 1 MG/ML IJ SOLN
INTRAMUSCULAR | Status: DC | PRN
  Administered 2022-12-18: 1 mg

## 2022-12-18 MED ORDER — FENTANYL CITRATE PF 50 MCG/ML IJ SOSY
50.0000 ug | PREFILLED_SYRINGE | INTRAMUSCULAR | Status: AC
  Administered 2022-12-18: 50 ug via INTRAVENOUS
  Filled 2022-12-18: qty 2

## 2022-12-18 MED ORDER — DEXAMETHASONE SODIUM PHOSPHATE 10 MG/ML IJ SOLN
INTRAMUSCULAR | Status: AC
  Filled 2022-12-18: qty 1

## 2022-12-18 MED ORDER — EPINEPHRINE PF 1 MG/ML IJ SOLN
INTRAMUSCULAR | Status: AC
  Filled 2022-12-18: qty 1

## 2022-12-18 MED ORDER — OXYCODONE HCL 5 MG PO TABS: 5.0000 mg | ORAL_TABLET | Freq: Once | ORAL | Status: DC | PRN

## 2022-12-18 MED ORDER — ONDANSETRON HCL 4 MG/2ML IJ SOLN
INTRAMUSCULAR | Status: DC | PRN
  Administered 2022-12-18: 4 mg via INTRAVENOUS

## 2022-12-18 MED ORDER — BUPIVACAINE LIPOSOME 1.3 % IJ SUSP
INTRAMUSCULAR | Status: DC | PRN
  Administered 2022-12-18: 10 mL via PERINEURAL

## 2022-12-18 MED ORDER — MEPERIDINE HCL 50 MG/ML IJ SOLN: 6.2500 mg | INTRAMUSCULAR | Status: DC | PRN

## 2022-12-18 MED ORDER — PHENYLEPHRINE 80 MCG/ML (10ML) SYRINGE FOR IV PUSH (FOR BLOOD PRESSURE SUPPORT)
PREFILLED_SYRINGE | INTRAVENOUS | Status: DC | PRN
  Administered 2022-12-18: 160 ug via INTRAVENOUS

## 2022-12-18 MED ORDER — LIDOCAINE 2% (20 MG/ML) 5 ML SYRINGE
INTRAMUSCULAR | Status: DC | PRN
  Administered 2022-12-18: 60 mg via INTRAVENOUS

## 2022-12-18 MED ORDER — PROPOFOL 10 MG/ML IV BOLUS
INTRAVENOUS | Status: DC | PRN
  Administered 2022-12-18: 50 mg via INTRAVENOUS
  Administered 2022-12-18: 200 mg via INTRAVENOUS
  Administered 2022-12-18: 50 mg via INTRAVENOUS

## 2022-12-18 MED ORDER — LACTATED RINGERS IV SOLN: INTRAVENOUS | Status: DC

## 2022-12-18 MED ORDER — SODIUM CHLORIDE 0.9 % IR SOLN
Status: DC | PRN
  Administered 2022-12-18: 6000 mL

## 2022-12-18 MED ORDER — ROCURONIUM BROMIDE 10 MG/ML (PF) SYRINGE
PREFILLED_SYRINGE | INTRAVENOUS | Status: DC | PRN
  Administered 2022-12-18: 60 mg via INTRAVENOUS

## 2022-12-18 MED ORDER — ACETAMINOPHEN 160 MG/5ML PO SOLN: 325.0000 mg | ORAL | Status: DC | PRN

## 2022-12-18 MED ORDER — PROPOFOL 10 MG/ML IV BOLUS
INTRAVENOUS | Status: AC
  Filled 2022-12-18: qty 20

## 2022-12-18 MED ORDER — MIDAZOLAM HCL 2 MG/2ML IJ SOLN
1.0000 mg | INTRAMUSCULAR | Status: AC
  Administered 2022-12-18: 2 mg via INTRAVENOUS
  Filled 2022-12-18: qty 2

## 2022-12-18 MED ORDER — ESMOLOL HCL 100 MG/10ML IV SOLN
INTRAVENOUS | Status: AC
  Filled 2022-12-18: qty 10

## 2022-12-18 MED ORDER — PROMETHAZINE HCL 12.5 MG PO TABS: 12.5000 mg | ORAL_TABLET | Freq: Four times a day (QID) | ORAL | 0 refills | Status: DC | PRN

## 2022-12-18 MED ORDER — ONDANSETRON HCL 4 MG/2ML IJ SOLN: 4.0000 mg | Freq: Once | INTRAMUSCULAR | Status: DC | PRN

## 2022-12-18 MED ORDER — BUPIVACAINE HCL 0.5 % IJ SOLN
INTRAMUSCULAR | Status: DC | PRN
  Administered 2022-12-18: 15 mL

## 2022-12-18 MED ORDER — OXYCODONE HCL 5 MG/5ML PO SOLN: 5.0000 mg | Freq: Once | ORAL | Status: DC | PRN

## 2022-12-18 MED ORDER — CEFAZOLIN SODIUM-DEXTROSE 2-4 GM/100ML-% IV SOLN
2.0000 g | INTRAVENOUS | Status: AC
  Administered 2022-12-18: 2 g via INTRAVENOUS
  Filled 2022-12-18: qty 100

## 2022-12-18 MED ORDER — ACETAMINOPHEN 325 MG PO TABS: 325.0000 mg | ORAL_TABLET | ORAL | Status: DC | PRN

## 2022-12-18 MED ORDER — ORAL CARE MOUTH RINSE: 15.0000 mL | Freq: Once | OROMUCOSAL | Status: AC

## 2022-12-18 MED ORDER — CHLORHEXIDINE GLUCONATE 0.12 % MT SOLN
15.0000 mL | Freq: Once | OROMUCOSAL | Status: AC
  Administered 2022-12-18: 15 mL via OROMUCOSAL

## 2022-12-18 MED ORDER — SUGAMMADEX SODIUM 200 MG/2ML IV SOLN
INTRAVENOUS | Status: DC | PRN
  Administered 2022-12-18: 100 mg via INTRAVENOUS
  Administered 2022-12-18: 200 mg via INTRAVENOUS

## 2022-12-18 MED ORDER — FENTANYL CITRATE PF 50 MCG/ML IJ SOSY: 25.0000 ug | PREFILLED_SYRINGE | INTRAMUSCULAR | Status: DC | PRN

## 2022-12-18 SURGICAL SUPPLY — 43 items
AID PSTN UNV HD RSTRNT DISP (MISCELLANEOUS) ×1
BAG COUNTER SPONGE SURGICOUNT (BAG) IMPLANT
BAG SPNG CNTER NS LX DISP (BAG)
BLADE EXCALIBUR 4.0X13 (MISCELLANEOUS) ×1 IMPLANT
BURR OVAL 8 FLU 4.0X13 (MISCELLANEOUS) ×1 IMPLANT
CANNULA SHOULDER 7CM (CANNULA) IMPLANT
CANNULA TWIST IN 8.25X7CM (CANNULA) ×1 IMPLANT
COVER SURGICAL LIGHT HANDLE (MISCELLANEOUS) ×1 IMPLANT
DRAPE ORTHO SPLIT 77X108 STRL (DRAPES) ×2
DRAPE SHEET LG 3/4 BI-LAMINATE (DRAPES) ×2 IMPLANT
DRAPE STERI 35X30 U-POUCH (DRAPES) ×1 IMPLANT
DRAPE SURG 17X11 SM STRL (DRAPES) ×1 IMPLANT
DRAPE SURG ORHT 6 SPLT 77X108 (DRAPES) ×2 IMPLANT
DRAPE U-SHAPE 47X51 STRL (DRAPES) ×1 IMPLANT
DRSG ADAPTIC 3X8 NADH LF (GAUZE/BANDAGES/DRESSINGS) IMPLANT
DRSG EMULSION OIL 3X3 NADH (GAUZE/BANDAGES/DRESSINGS) ×1 IMPLANT
DURAPREP 26ML APPLICATOR (WOUND CARE) ×1 IMPLANT
GAUZE PAD ABD 8X10 STRL (GAUZE/BANDAGES/DRESSINGS) ×3 IMPLANT
GAUZE SPONGE 4X4 12PLY STRL (GAUZE/BANDAGES/DRESSINGS) ×1 IMPLANT
GLOVE BIO SURGEON STRL SZ8 (GLOVE) ×2 IMPLANT
GLOVE BIOGEL PI IND STRL 7.0 (GLOVE) ×1 IMPLANT
GLOVE BIOGEL PI IND STRL 8 (GLOVE) ×2 IMPLANT
GLOVE SURG SYN 7.0 (GLOVE) ×1 IMPLANT
GLOVE SURG SYN 7.0 PF PI (GLOVE) ×1 IMPLANT
GOWN SRG XL LVL 4 BRTHBL STRL (GOWNS) ×1 IMPLANT
GOWN STRL NON-REIN XL LVL4 (GOWNS) ×1
GOWN STRL REUS W/ TWL XL LVL3 (GOWN DISPOSABLE) ×2 IMPLANT
GOWN STRL REUS W/TWL XL LVL3 (GOWN DISPOSABLE) ×2
KIT BASIN OR (CUSTOM PROCEDURE TRAY) ×1 IMPLANT
KIT TURNOVER KIT A (KITS) IMPLANT
MANIFOLD NEPTUNE II (INSTRUMENTS) ×1 IMPLANT
NDL SAFETY ECLIP 18X1.5 (MISCELLANEOUS) ×1 IMPLANT
PACK ARTHROSCOPY WL (CUSTOM PROCEDURE TRAY) ×1 IMPLANT
PORT APPOLLO RF 90DEGREE MULTI (SURGICAL WAND) ×1 IMPLANT
PROBE BIPOLAR ATHRO 135MM 90D (MISCELLANEOUS) ×1 IMPLANT
PROTECTOR NERVE ULNAR (MISCELLANEOUS) ×1 IMPLANT
RESTRAINT HEAD UNIVERSAL NS (MISCELLANEOUS) ×1 IMPLANT
SLING ARM FOAM STRAP MED (SOFTGOODS) IMPLANT
SLING ARM IMMOBILIZER LRG (SOFTGOODS) IMPLANT
SLING ARM IMMOBILIZER MED (SOFTGOODS) ×1 IMPLANT
SUT ETHILON 4 0 PS 2 18 (SUTURE) ×1 IMPLANT
TUBING ARTHROSCOPY IRRIG 16FT (MISCELLANEOUS) ×1 IMPLANT
TUBING CONNECTING 10 (TUBING) ×1 IMPLANT

## 2022-12-18 NOTE — Anesthesia Procedure Notes (Signed)
Procedure Name: Intubation Date/Time: 12/18/2022 1:26 PM  Performed by: Gerald Leitz, CRNAPre-anesthesia Checklist: Patient identified, Patient being monitored, Timeout performed, Emergency Drugs available and Suction available Patient Re-evaluated:Patient Re-evaluated prior to induction Oxygen Delivery Method: Circle system utilized Preoxygenation: Pre-oxygenation with 100% oxygen Induction Type: IV induction Ventilation: Mask ventilation without difficulty Laryngoscope Size: Mac and 3 Grade View: Grade I Tube type: Oral Tube size: 7.0 mm Number of attempts: 1 Airway Equipment and Method: Stylet Placement Confirmation: ETT inserted through vocal cords under direct vision, positive ETCO2 and breath sounds checked- equal and bilateral Secured at: 21 cm Tube secured with: Tape Dental Injury: Teeth and Oropharynx as per pre-operative assessment

## 2022-12-18 NOTE — Op Note (Unsigned)
NAME: Carrie [name], Carrie [name] MEDICAL RECORD NO: [id number] ACCOUNT NO: [id number] DATE OF BIRTH: 07-31-1956 FACILITY: [facility] LOCATION: [facility] PHYSICIAN: Monico Blitz. Rhona Raider, MD  Operative Report   DATE OF PROCEDURE: 12/18/2022  PREOPERATIVE DIAGNOSIS:  Right shoulder partial rotator cuff tear.  POSTOPERATIVE DIAGNOSIS:  Right shoulder partial rotator cuff tear.  PROCEDURES:   1.  Right shoulder arthroscopic acromioplasty. 2.  Right shoulder arthroscopic extensive debridement.  ANESTHESIA: General and block.  SURGEON:  Monico Blitz. Rhona Raider, MD.  ASSISTANT:  Loni Dolly, PA.  INDICATIONS FOR PROCEDURE:  The patient is a 67 year old woman with a long history of right shoulder pain.  This has persisted despite physical therapy, anti-inflammatories and injections.  By MRI scan, she also had a partial thickness rotator cuff tear.   She has difficulty resting at night and using her arm and is offered an arthroscopy.  Informed operative consent was obtained after discussion of possible complications including, reaction to anesthesia and infection.  SUMMARY OF FINDINGS OF PROCEDURE:  Under general anesthesia and a block a right shoulder arthroscopy was performed.  Glenohumeral joint showed no degenerative change and the biceps tendon looked normal.  The rotator cuff appeared benign from below.  In  the subacromial space, she had a partial thickness rotator cuff tear addressed with an extensive debridement of the infraspinatus and supraspinatus regions, but no tear worthy of repair was found.  We also performed an extensive debridement of the  subacromial bursa and the subdeltoid bursa.  She had a very prominent subacromial morphology dressed with an acromioplasty back to a flat surface.  She was scheduled to go home same day.  DESCRIPTION OF PROCEDURE: The patient was taken to the operating suite where general anesthetic was applied without difficulty.  She was also given a block in the preanesthesia area.   She was positioned in beach chair position and prepped and draped in  normal sterile fashion.  After the administration of preoperative IV Kefzol and appropriate time-out an arthroscopy of the right shoulder was performed through total of 2 portals.  Findings were as noted above and procedure consisted predominantly of the  acromioplasty, which was done on the lateral position followed by transfer to go to the posterior position.  We then performed the extensive debridement of the bursal aspect of the cuff and the subdeltoid and subacromial regions.  The shoulder was  thoroughly irrigated, followed by removal of arthroscopic equipment.  Portals were reapproximated loosely with nylon followed by Adaptic, dry gauze and tape.  Estimated blood loss and intraoperative fluids can be obtained from anesthesia records.  DISPOSITION: The patient was extubated in the operating room and taken to recovery room in stable condition.  Plans were for her to go home same day and follow up in the office in less than week.  I will contact her by phone tonight.   PUS D: 12/18/2022 2:09:20 pm T: 12/18/2022 2:20:00 pm  JOB: [id number]

## 2022-12-18 NOTE — Anesthesia Procedure Notes (Addendum)
Anesthesia Regional Block: Interscalene brachial plexus block   Pre-Anesthetic Checklist: , timeout performed,  Correct Patient, Correct Site, Correct Laterality,  Correct Procedure, Correct Position, site marked,  Risks and benefits discussed,  Surgical consent,  Pre-op evaluation,  At surgeon's request and post-op pain management  Laterality: Right  Prep: chloraprep       Needles:  Injection technique: Single-shot  Needle Type: Echogenic Stimulator Needle     Needle Length: 5cm  Needle Gauge: 22     Additional Needles:   Procedures:, nerve stimulator,,, ultrasound used (permanent image in chart),,     Nerve Stimulator or Paresthesia:  Response: HAND, 0.45 mA  Additional Responses:   Narrative:  Start time: 12/18/2022 11:50 AM End time: 12/18/2022 11:55 AM Injection made incrementally with aspirations every 5 mL.  Performed by: Personally  Anesthesiologist: Janeece Riggers, MD  Additional Notes: Functioning IV was confirmed and monitors were applied.  A 26m 22ga Arrow echogenic stimulator needle was used. Sterile prep and drape,hand hygiene and sterile gloves were used. Ultrasound guidance: relevant anatomy identified, needle position confirmed, local anesthetic spread visualized around nerve(s)., vascular puncture avoided.  Image printed for medical record. Negative aspiration and negative test dose prior to incremental administration of local anesthetic. The patient tolerated the procedure well.

## 2022-12-18 NOTE — Interval H&P Note (Signed)
History and Physical Interval Note:  12/18/2022 11:12 AM  Carrie [name]  has presented today for surgery, with the diagnosis of RIGHT SHOULDER [place].  The various methods of treatment have been discussed with the patient and family. After consideration of risks, benefits and other options for treatment, the patient has consented to  Procedure(s): RIGHT SHOULDER ARTHROSCOPY (Right) as a surgical intervention.  The patient's history has been reviewed, patient examined, no change in status, stable for surgery.  I have reviewed the patient's chart and labs.  Questions were answered to the patient's satisfaction.     [name]

## 2022-12-18 NOTE — Anesthesia Postprocedure Evaluation (Signed)
Anesthesia Post Note  Patient: Carrie [name]  Procedure(s) Performed: RIGHT SHOULDER ARTHROSCOPY, ACROMIOPLASTY AND DEBRIDEMENT (Right: Shoulder)     Patient location during evaluation: PACU Anesthesia Type: Regional and General Level of consciousness: awake and alert Pain management: pain level controlled Vital Signs Assessment: post-procedure vital signs reviewed and stable Respiratory status: spontaneous breathing, nonlabored ventilation, respiratory function stable and patient connected to nasal cannula oxygen Cardiovascular status: blood pressure returned to baseline and stable Postop Assessment: no apparent nausea or vomiting Anesthetic complications: no   No notable events documented.  Last Vitals:  Vitals:   12/18/22 1515 12/18/22 1523  BP: (!) 150/79 (!) 146/91  Pulse: 80 79  Resp: 11 18  Temp: (!) 36.4 C (!) 36.4 C  SpO2: 93% 92%    Last Pain:  Vitals:   12/18/22 1523  TempSrc:   PainSc: 0-No pain                 [name]

## 2022-12-18 NOTE — Anesthesia Preprocedure Evaluation (Addendum)
Anesthesia Evaluation  Patient identified by MRN, date of birth, ID band Patient awake    Reviewed: Allergy & Precautions, NPO status , Patient's Chart, lab work & pertinent test results  Airway Mallampati: III  TM Distance: >3 FB Neck ROM: Full    Dental  (+) Dental Advisory Given, Edentulous Lower, Edentulous Upper,    Pulmonary asthma    Pulmonary exam normal breath sounds clear to auscultation       Cardiovascular hypertension, Pt. on medications + Peripheral Vascular Disease  Normal cardiovascular exam Rhythm:Regular Rate:Normal  Echo 06/26/22: 1. Left ventricular ejection fraction, by estimation, is 65 to 70%. The  left ventricle has normal function. The left ventricle has no regional  wall motion abnormalities. There is mild asymmetric left ventricular  hypertrophy of the basal-septal segment.  Indeterminate diastolic filling due to E-A fusion.   2. Right ventricular systolic function is normal. The right ventricular  size is normal. Tricuspid regurgitation signal is inadequate for assessing  PA pressure.   3. The mitral valve is grossly normal. Trivial mitral valve  regurgitation. No evidence of mitral stenosis.   4. The aortic valve is grossly normal. Aortic valve regurgitation is not  visualized. No aortic stenosis is present.   5. The inferior vena cava is normal in size with greater than 50%  respiratory variability, suggesting right atrial pressure of 3 mmHg.      Neuro/Psych  PSYCHIATRIC DISORDERS Anxiety Depression    TIA Neuromuscular disease No Residual Symptoms    GI/Hepatic ,GERD  Medicated,,(+) Cirrhosis         Endo/Other  diabetes, Type 2, Oral Hypoglycemic Agents    Renal/GU Renal InsufficiencyRenal disease     Musculoskeletal  (+) Arthritis ,    Abdominal   Peds  Hematology  (+) Blood dyscrasia, anemia Plt 326k   Anesthesia Other Findings   Reproductive/Obstetrics                               Anesthesia Physical Anesthesia Plan  ASA: 3  Anesthesia Plan: General and Regional   Post-op Pain Management: Regional block*   Induction: Intravenous  PONV Risk Score and Plan: 3 and Midazolam, Dexamethasone and Ondansetron  Airway Management Planned: Oral ETT  Additional Equipment: None  Intra-op Plan:   Post-operative Plan: Extubation in OR  Informed Consent: I have reviewed the patients History and Physical, chart, labs and discussed the procedure including the risks, benefits and alternatives for the proposed anesthesia with the patient or authorized representative who has indicated his/her understanding and acceptance.     Dental advisory given  Plan Discussed with: CRNA and Anesthesiologist  Anesthesia Plan Comments:         Anesthesia Quick Evaluation

## 2022-12-18 NOTE — Brief Op Note (Signed)
DILLYN NARDIN OF:[id number] 12/18/2022   PRE-OP DIAGNOSIS: right sh partial RCT  POST-OP DIAGNOSIS: same  PROCEDURE: right sh scope  ANESTHESIA: general and block  [name]   Dictation #:  [id number]

## 2022-12-18 NOTE — Transfer of Care (Signed)
Immediate Anesthesia Transfer of Care Note  Patient: Carrie [name]  Procedure(s) Performed: Procedure(s): RIGHT SHOULDER ARTHROSCOPY, ACROMIOPLASTY AND DEBRIDEMENT (Right)  Patient Location: PACU  Anesthesia Type:General  Level of Consciousness: Alert, Awake, Oriented  Airway & Oxygen Therapy: Patient Spontanous Breathing  Post-op Assessment: Report given to RN  Post vital signs: Reviewed and stable  Last Vitals:  Vitals:   12/18/22 1227 12/18/22 1232  BP: (!) 166/91 (!) 177/92  Pulse: 90 86  Resp: 16 15  Temp:    SpO2: [id number]    Complications: No apparent anesthesia complications

## 2022-12-19 ENCOUNTER — Encounter (HOSPITAL_COMMUNITY): Payer: Self-pay | Admitting: Orthopaedic Surgery

## 2022-12-24 ENCOUNTER — Other Ambulatory Visit: Payer: Self-pay | Admitting: Internal Medicine

## 2022-12-24 DIAGNOSIS — Z794 Long term (current) use of insulin: Secondary | ICD-10-CM

## 2023-02-03 ENCOUNTER — Other Ambulatory Visit: Payer: Self-pay | Admitting: Internal Medicine

## 2023-02-14 ENCOUNTER — Ambulatory Visit (INDEPENDENT_AMBULATORY_CARE_PROVIDER_SITE_OTHER): Payer: 59 | Admitting: Internal Medicine

## 2023-02-14 ENCOUNTER — Encounter: Payer: Self-pay | Admitting: Internal Medicine

## 2023-02-14 VITALS — BP 140/82 | HR 81 | Ht 60.0 in | Wt 147.0 lb

## 2023-02-14 DIAGNOSIS — Z7985 Long-term (current) use of injectable non-insulin antidiabetic drugs: Secondary | ICD-10-CM

## 2023-02-14 DIAGNOSIS — E1142 Type 2 diabetes mellitus with diabetic polyneuropathy: Secondary | ICD-10-CM | POA: Diagnosis not present

## 2023-02-14 DIAGNOSIS — Z7984 Long term (current) use of oral hypoglycemic drugs: Secondary | ICD-10-CM | POA: Diagnosis not present

## 2023-02-14 DIAGNOSIS — Z794 Long term (current) use of insulin: Secondary | ICD-10-CM

## 2023-02-14 DIAGNOSIS — E65 Localized adiposity: Secondary | ICD-10-CM

## 2023-02-14 DIAGNOSIS — E785 Hyperlipidemia, unspecified: Secondary | ICD-10-CM

## 2023-02-14 LAB — POCT GLYCOSYLATED HEMOGLOBIN (HGB A1C): Hemoglobin A1C: 6.8 % — AB (ref 4.0–5.6)

## 2023-02-14 MED ORDER — METFORMIN HCL ER 500 MG PO TB24: 500.0000 mg | ORAL_TABLET | Freq: Every day | ORAL | 3 refills | Status: DC

## 2023-02-14 MED ORDER — SEMAGLUTIDE(0.25 OR 0.5MG/DOS) 2 MG/3ML ~~LOC~~ SOPN: PEN_INJECTOR | SUBCUTANEOUS | 3 refills | Status: DC

## 2023-02-14 NOTE — Patient Instructions (Addendum)
Please continue: - Metformin ER 500 mg daily  Stop Trulicity and change to: - Ozempic 0.5 mg weekly. If you start losing too much weight again, decrease the dose to 0.25 mg weekly.  Please return in 3-4 months.

## 2023-02-14 NOTE — Progress Notes (Signed)
Patient ID: Carrie [name], female   DOB: 06/30/1956, 67 y.o.   MRN: [id number]  HPI: Carrie [name] is a 67 y.o.-year-old female, returning for follow-up for DM2, dx in 2010, non-insulin-dependent, controlled, with complications (TIA, DR, CKD, peripheral neuropathy, h/o gastroparesis). Pt. previously saw Dr. Everardo All, last visit 6 months ago.  Interim history: No increased urination, blurry vision, nausea, chest pain. She mentions weight gain  - 9 lbs in last 2 months, but overall, she gained 29 pounds after stopping Ozempic. Since last visit, she had total hip replacement on 10/26/2022 after a femoral fracture. She also had right shoulder arthroscopy 12/18/2022. No joint pains now.  Reviewed HbA1c: Lab Results  Component Value Date   HGBA1C 6.3 (H) 12/14/2022   HGBA1C 6.0 (H) 06/25/2022   HGBA1C 5.8 (A) 01/16/2022   HGBA1C 6.1 (A) 09/21/2021   HGBA1C 6.0 (A) 03/16/2021   HGBA1C 6.2 (A) 09/20/2020   HGBA1C 5.6 05/05/2020   HGBA1C 6.0 (H) 02/09/2020   HGBA1C 5.3 01/18/2020   HGBA1C 5.5 11/19/2019   Pt is on a regimen of: - Metformin 500 mg daily - Ozempic 1 mg weekly >> No N/V/AP/D/C >> Trulicity 3 mg weekly - changed since last  OV by rheumatology 2/2 signif. Weight loss (lowest weight 118 lbs). She had vaginitis from [employer]. She was on multiple insulin injections 2011-2021 but had problems forgetting them - per Dr. George Hugh note.  Pt checks her sugars 2x a day and they are: - am: 90s-120s >> 90s-112 - 2h after b'fast: n/c - before lunch: n/c - 2h after lunch: n/c - before dinner: n/c - 2h after dinner: 140-170s >> 140-150s, 200 - bedtime: n/c - nighttime: n/c Lowest sugar was 90s; she has hypoglycemia awareness at 60s.  Highest sugar was 190s >> 200.  Glucometer: Accu-Chek  - + CKD, last BUN/creatinine:  Lab Results  Component Value Date   BUN 24 (H) 12/14/2022   BUN 18 10/29/2022   CREATININE 1.24 (H) 12/14/2022   CREATININE 1.09 (H) 10/29/2022  On Cozaar 100 mg  daily.  -+ HL; last set of lipids: Lab Results  Component Value Date   CHOL 139 06/25/2022   HDL 36 (L) 06/25/2022   LDLCALC 73 06/25/2022   TRIG 150 (H) 06/25/2022   CHOLHDL 3.9 06/25/2022  On pravastatin 40 mg daily.  - last eye exam was in 2024. 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 She goes to retinal specialist - on IO injections.   - + numbness and tingling in her feet. She sees neurology >> on Gabapentin - still symptomatic >> tapered it off >> Cymbalta >> helps.  Last foot exam 08/2022.  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  ROS: + see HPI  Past Medical History:  Diagnosis Date   Anxiety    Arthritis    psoriatic arithritis, DDD    Asthma    Chronic kidney disease    CKD3a   Depression    Diabetes mellitus    type 2   Gastritis    Gastroparesis    GERD (gastroesophageal reflux disease)    Hernia    Hyperlipemia    Hyperlipemia    Hypertension    Liver cirrhosis (HCC)    Nausea and vomiting 01/06/2013   Neuropathy    Bil feet re: to Diabetes   Obesity    Osteoporosis    Plantar fasciitis  Stricture and stenosis of esophagus    Stroke [facility])    TIA (transient ischemic attack)    8-10 yrs ago, no problems since   Tuberculosis    tested positive 2011, no symptoms, was on medicine for 6 months   Past Surgical History:  Procedure Laterality Date   BREAST SURGERY Bilateral    biopsies both breast   CARDIAC CATHETERIZATION  08/30/2010   CESAREAN SECTION     x 2    CHOLECYSTECTOMY     COLONOSCOPY     FOOT SURGERY Left    spur removal   HERNIA REPAIR     umbilical hernia repaired at the same time as cholecystectomy   IR FLUORO GUIDE CV LINE RIGHT  04/12/2021   IR US GUIDE VASC ACCESS RIGHT  04/12/2021   JOINT REPLACEMENT     bilat knee    KNEE SURGERY Bilateral    x 2 joint knee replacements   KYPHOPLASTY N/A 03/28/2022    Procedure: LUMBAR ONE KYPHOPLASTY;  Surgeon: Estill Bamberg, MD;  Location: [facility] OR;  Service: Orthopedics;  Laterality: N/A;  LUMBAR ONE KYPHOPLASTY   SHOULDER ARTHROSCOPY Right 12/18/2022   Procedure: RIGHT SHOULDER ARTHROSCOPY, ACROMIOPLASTY AND DEBRIDEMENT;  Surgeon: Marcene Corning, MD;  Location: [facility] ORS;  Service: Orthopedics;  Laterality: Right;   TOTAL HIP ARTHROPLASTY Left 10/26/2022   Procedure: TOTAL HIP ARTHROPLASTY ANTERIOR APPROACH;  Surgeon: Jodi Geralds, MD;  Location: [facility] ORS;  Service: Orthopedics;  Laterality: Left;   TOTAL KNEE REVISION Left 10/03/2021   Procedure: LEFT TOTAL KNEE REVISION POLY SWAP WITH IRRIGATION AND DEBRIDEMENT;  Surgeon: Marcene Corning, MD;  Location: [facility] ORS;  Service: Orthopedics;  Laterality: Left;   UPPER GASTROINTESTINAL ENDOSCOPY     WISDOM TOOTH EXTRACTION     Social History   Socioeconomic History   Marital status: Single    Spouse name: Not on file   Number of children: 2   Years of education: Not on file   Highest education level: Not on file  Occupational History   Occupation: Disabled   Tobacco Use   Smoking status: Never   Smokeless tobacco: Never  Vaping Use   Vaping Use: Never used  Substance and Sexual Activity   Alcohol use: Not Currently    Comment: socially   Drug use: No   Sexual activity: Not Currently    Birth control/protection: Post-menopausal  Other Topics Concern   Not on file  Social History Narrative   Sodas daily    Social Determinants of Health   Financial Resource Strain: Not on file  Food Insecurity: No Food Insecurity (10/26/2022)   Hunger Vital Sign    Worried About Running Out of Food in the Last Year: Never true    Ran Out of Food in the Last Year: Never true  Transportation Needs: No Transportation Needs (10/26/2022)   PRAPARE - Administrator, Civil Service (Medical): No    Lack of Transportation (Non-Medical): No  Physical Activity: Not on file  Stress: Not on file  Social Connections:  Not on file  Intimate Partner Violence: Not At Risk (10/26/2022)   Humiliation, Afraid, Rape, and Kick questionnaire    Fear of Current or Ex-Partner: No    Emotionally Abused: No    Physically Abused: No    Sexually Abused: No   Current Outpatient Medications on File Prior to Visit  Medication Sig Dispense Refill   Accu-Chek Softclix Lancets lancets Use as instructed to check blood sugar 2 times daily Dx  E11.9 200 each 0   albuterol (VENTOLIN HFA) 108 (90 Base) MCG/ACT inhaler Inhale 2 puffs into the lungs every 4 (four) hours as needed for wheezing or shortness of breath (or coughing).     amLODipine (NORVASC) 2.5 MG tablet Take 2.5 mg by mouth daily.     aspirin EC 81 MG tablet Take 81 mg by mouth daily. Swallow whole.     Blood Glucose Monitoring Suppl (ACCU-CHEK GUIDE ME) w/Device KIT Use as instructed to check blood sugar 2 times daily Dx E11.9 1 kit 0   busPIRone (BUSPAR) 5 MG tablet Take 10 mg by mouth at bedtime.     Calcium Carb-Cholecalciferol (CALCIUM CARBONATE-VITAMIN D3 PO) Take 1 tablet by mouth daily.     celecoxib (CELEBREX) 200 MG capsule Take 1 capsule (200 mg total) by mouth 2 (two) times daily. 60 capsule 0   COSENTYX SENSOREADY, 300 MG, 150 MG/ML SOAJ Inject 300 mg into the skin every 30 (thirty) days.     docusate sodium (COLACE) 100 MG capsule Take 1 capsule (100 mg total) by mouth 2 (two) times daily. (Patient taking differently: Take 100 mg by mouth daily as needed for mild constipation.) 10 capsule 0   Dulaglutide (TRULICITY) 3 MG/0.5ML SOPN INJECT 3 MG AS DIRECTED ONCE A WEEK. 2 mL 2   DULoxetine (CYMBALTA) 60 MG capsule Take 60 mg by mouth daily.     FLUoxetine (PROZAC) 20 MG capsule Take 20 mg by mouth daily.     HYDROcodone-acetaminophen (NORCO/VICODIN) 5-325 MG tablet Take 1-2 tablets by mouth every 6 (six) hours as needed for moderate pain or severe pain (post op pain). 20 tablet 0   losartan (COZAAR) 100 MG tablet TAKE 1 TABLET BY MOUTH EVERY DAY IN THE  MORNING 90 tablet 1   metFORMIN (GLUCOPHAGE) 500 MG tablet Take 500 mg by mouth 2 (two) times daily with a meal.     metFORMIN (GLUCOPHAGE-XR) 500 MG 24 hr tablet Take 500 mg by mouth at bedtime.     NEXIUM 40 MG capsule Take 40 mg by mouth 2 (two) times daily before a meal.     pravastatin (PRAVACHOL) 80 MG tablet Take 80 mg by mouth daily.     primidone (MYSOLINE) 250 MG tablet Take 250 mg by mouth at bedtime.     promethazine (PHENERGAN) 12.5 MG tablet Take 1-2 tablets (12.5-25 mg total) by mouth every 6 (six) hours as needed for nausea or vomiting. 30 tablet 0   QVAR REDIHALER 80 MCG/ACT inhaler Inhale 1 puff into the lungs in the morning and at bedtime.     tiZANidine (ZANAFLEX) 2 MG tablet Take 1 tablet (2 mg total) by mouth every 8 (eight) hours as needed for muscle spasms. 40 tablet 0   No current facility-administered medications on file prior to visit.   Allergies  Allergen Reactions   Lisinopril Cough   Family History  Problem Relation Age of Onset   Diabetes Other        maternal aunts and uncles   Diabetes Father    Liver disease Father    Emphysema Father        smoked   Diabetes Sister    Diabetes Brother    Sarcoidosis Brother    Emphysema Mother        smoked   Asthma Mother    Asthma Brother    Colon cancer Neg Hx    Esophageal cancer Neg Hx    Stomach cancer Neg Hx  Rectal cancer Neg Hx    PE: BP (!) 140/82 (BP Location: Right Arm, Patient Position: Sitting, Cuff Size: Normal)   Pulse 81   Ht 5' (1.524 m)   Wt 147 lb (66.7 kg)   LMP  (LMP Unknown)   SpO2 96%   BMI 28.71 kg/m  Wt Readings from Last 3 Encounters:  02/14/23 147 lb (66.7 kg)  12/18/22 138 lb 14.2 oz (63 kg)  12/14/22 140 lb (63.5 kg)   Constitutional: overweight, in NAD Eyes:  EOMI, no exophthalmos ENT: no neck masses, no cervical lymphadenopathy Cardiovascular: RRR, No MRG Respiratory: CTA B Musculoskeletal: no deformities Skin:no rashes Neurological: + tremor with  outstretched hands  ASSESSMENT: 1. DM2, non-insulin-dependent, controlled, with complications - DR - H/o TIA - CKD - PN - H/o Gastroparesis - resolved  2. HL  3.  Central obesity Central obesity  PLAN:  1. Patient with longstanding, uncontrolled, type 2 diabetes, on oral antidiabetic regimen with low-dose metformin and weekly GLP-1 receptor agonist.  At last visit, HbA1c was slightly higher but still at goal, at 6.0%.  However, she had another HbA1c obtained 2 months ago and this was slightly higher, at 6.3%.  At last visit, we did not change her regimen.  She was tolerating Ozempic well despite previous history of gastroparesis.  However, since then, she changed to Trulicity due to significant weight loss on the 1 mg Ozempic dose. -At today's visit, sugars are at goal with only few hyperglycemic exceptions after dinner.  She would be interested to go back to Ozempic.  Will try to switch back to this but at a lower dose, 0.5 mg weekly.  Will continue metformin for now. - I suggested to:  Patient Instructions  Please continue: - Metformin ER 500 mg daily  Stop Trulicity and change to: - Ozempic 0.5 mg weekly. If you start losing too much weight again, decrease the dose to 0.25 mg weekly.  Please return in 3-4 months.  - we checked her HbA1c: 6.8% (higher) - advised to check sugars at different times of the day - 1x a day, rotating check times - advised for yearly eye exams >> she is UTD - return to clinic in 3-4 months  2. HL -Reviewed latest lipid panel from 06/2022: LDL slightly above goal, HDL slightly low: Lab Results  Component Value Date   CHOL 139 06/25/2022   HDL 36 (L) 06/25/2022   LDLCALC 73 06/25/2022   TRIG 150 (H) 06/25/2022   CHOLHDL 3.9 06/25/2022  -Continues on pravastatin 40 mg daily without side effects  3.  Central obesity -Patient previously on Ozempic, on which she lost a significant amount of weight, down to 118 pounds.  She was recommended to switch  from Ozempic to Trulicity, but she gained 29 pounds back after doing so.  Her overall BMI is in the overweight range, however, she has central obesity and because of this and her significant weight gain, I did recommend to go back to Ozempic but to a lower dose, 0.5 mg weekly.  I did advise her that if she started to lose a significant amount of weight fast, to back off the dose to 0.25 mg weekly  Carlus Pavlov, MD PhD [facility]

## 2023-03-18 ENCOUNTER — Other Ambulatory Visit: Payer: Self-pay | Admitting: Infectious Diseases

## 2023-04-22 ENCOUNTER — Other Ambulatory Visit: Payer: Self-pay | Admitting: Infectious Diseases

## 2023-05-08 ENCOUNTER — Other Ambulatory Visit: Payer: Self-pay | Admitting: Infectious Diseases

## 2023-05-15 ENCOUNTER — Emergency Department (HOSPITAL_COMMUNITY): Payer: 59

## 2023-05-15 ENCOUNTER — Encounter (HOSPITAL_COMMUNITY): Payer: Self-pay

## 2023-05-15 ENCOUNTER — Emergency Department (HOSPITAL_COMMUNITY)
Admission: EM | Admit: 2023-05-15 | Discharge: 2023-05-15 | Disposition: A | Discharge status: Home / Self Care | Payer: 59 | Attending: Emergency Medicine | Admitting: Emergency Medicine

## 2023-05-15 ENCOUNTER — Emergency Department (HOSPITAL_BASED_OUTPATIENT_CLINIC_OR_DEPARTMENT_OTHER): Payer: 59

## 2023-05-15 ENCOUNTER — Other Ambulatory Visit: Payer: Self-pay

## 2023-05-15 DIAGNOSIS — Z7984 Long term (current) use of oral hypoglycemic drugs: Secondary | ICD-10-CM | POA: Insufficient documentation

## 2023-05-15 DIAGNOSIS — N189 Chronic kidney disease, unspecified: Secondary | ICD-10-CM | POA: Insufficient documentation

## 2023-05-15 DIAGNOSIS — R531 Weakness: Secondary | ICD-10-CM | POA: Diagnosis present

## 2023-05-15 DIAGNOSIS — R609 Edema, unspecified: Secondary | ICD-10-CM

## 2023-05-15 DIAGNOSIS — E1122 Type 2 diabetes mellitus with diabetic chronic kidney disease: Secondary | ICD-10-CM | POA: Insufficient documentation

## 2023-05-15 DIAGNOSIS — R600 Localized edema: Secondary | ICD-10-CM | POA: Diagnosis not present

## 2023-05-15 DIAGNOSIS — Z8673 Personal history of transient ischemic attack (TIA), and cerebral infarction without residual deficits: Secondary | ICD-10-CM | POA: Diagnosis not present

## 2023-05-15 DIAGNOSIS — Z7982 Long term (current) use of aspirin: Secondary | ICD-10-CM | POA: Insufficient documentation

## 2023-05-15 DIAGNOSIS — R251 Tremor, unspecified: Secondary | ICD-10-CM | POA: Insufficient documentation

## 2023-05-15 DIAGNOSIS — N3000 Acute cystitis without hematuria: Secondary | ICD-10-CM | POA: Insufficient documentation

## 2023-05-15 LAB — COMPREHENSIVE METABOLIC PANEL
ALT: 13 U/L (ref 0–44)
AST: 18 U/L (ref 15–41)
Albumin: 3.7 g/dL (ref 3.5–5.0)
Alkaline Phosphatase: 80 U/L (ref 38–126)
Anion gap: 9 (ref 5–15)
BUN: 18 mg/dL (ref 8–23)
CO2: 25 mmol/L (ref 22–32)
Calcium: 8.7 mg/dL — ABNORMAL LOW (ref 8.9–10.3)
Chloride: 100 mmol/L (ref 98–111)
Creatinine, Ser: 1.14 mg/dL — ABNORMAL HIGH (ref 0.44–1.00)
GFR, Estimated: 53 mL/min — ABNORMAL LOW (ref >60)
Glucose, Bld: 133 mg/dL — ABNORMAL HIGH (ref 70–99)
Potassium: 4.4 mmol/L (ref 3.5–5.1)
Sodium: 134 mmol/L — ABNORMAL LOW (ref 135–145)
Total Bilirubin: 0.4 mg/dL (ref 0.3–1.2)
Total Protein: 7.9 g/dL (ref 6.5–8.1)

## 2023-05-15 LAB — CBC
HCT: 35.3 % — ABNORMAL LOW (ref 36.0–46.0)
Hemoglobin: 10.9 g/dL — ABNORMAL LOW (ref 12.0–15.0)
MCH: 24.1 pg — ABNORMAL LOW (ref 26.0–34.0)
MCHC: 30.9 g/dL (ref 30.0–36.0)
MCV: 78.1 fL — ABNORMAL LOW (ref 80.0–100.0)
Platelets: 221 10*3/uL (ref 150–400)
RBC: 4.52 MIL/uL (ref 3.87–5.11)
RDW: 26.6 % — ABNORMAL HIGH (ref 11.5–15.5)
WBC: 6.5 10*3/uL (ref 4.0–10.5)
nRBC: 0 % (ref 0.0–0.2)

## 2023-05-15 LAB — URINALYSIS, W/ REFLEX TO CULTURE (INFECTION SUSPECTED)
Bilirubin Urine: NEGATIVE
Glucose, UA: NEGATIVE mg/dL
Hgb urine dipstick: NEGATIVE
Ketones, ur: NEGATIVE mg/dL
Nitrite: NEGATIVE
Protein, ur: NEGATIVE mg/dL
Specific Gravity, Urine: 1.002 — ABNORMAL LOW (ref 1.005–1.030)
pH: 7 (ref 5.0–8.0)

## 2023-05-15 LAB — CBG MONITORING, ED: Glucose-Capillary: 166 mg/dL — ABNORMAL HIGH (ref 70–99)

## 2023-05-15 LAB — AMMONIA: Ammonia: 14 umol/L (ref 9–35)

## 2023-05-15 MED ORDER — LORAZEPAM 2 MG/ML IJ SOLN: 1.0000 mg | Freq: Once | INTRAMUSCULAR | Status: DC | PRN

## 2023-05-15 MED ORDER — MORPHINE SULFATE (PF) 4 MG/ML IV SOLN
4.0000 mg | Freq: Once | INTRAVENOUS | Status: AC
  Administered 2023-05-15: 4 mg via INTRAVENOUS
  Filled 2023-05-15: qty 1

## 2023-05-15 MED ORDER — SODIUM CHLORIDE 0.9 % IV SOLN
1.0000 g | Freq: Once | INTRAVENOUS | Status: AC
  Administered 2023-05-15: 1 g via INTRAVENOUS
  Filled 2023-05-15: qty 10

## 2023-05-15 MED ORDER — CEPHALEXIN 500 MG PO CAPS: 500.0000 mg | ORAL_CAPSULE | Freq: Three times a day (TID) | ORAL | 0 refills | Status: DC

## 2023-05-15 NOTE — Discharge Instructions (Addendum)
Take keflex three times a day for 5 days for UTI  Talk to your doctor about medicines for tremors  See your doctor for follow-up  Return to ER if you have worse tremors, difficulty urinating, falling

## 2023-05-15 NOTE — ED Triage Notes (Signed)
Patient reports starting Monday patient has had generalized weakness, tremors, slurred speech, left leg numbness and swelling. Patient is A&Ox4, strengths are weak but equal, no facial droop, no slurred speech.

## 2023-05-15 NOTE — ED Provider Notes (Signed)
[facility] DEPART[facility] AT [facility] Provider Note   CSN: [id number] Arrival date & time: 05/15/23  1653     History  Chief Complaint  Patient presents with   Leg Swelling   Tremors    JAIDENCE WINGROVE is a 67 y.o. female history of diabetes, TIA, CKD, cirrhosis, here presenting with weakness.  Patient states that she has been having diffuse weakness for the last 4 days.  She states that she felt weak all over and then started having tremors yesterday.  She also started having slurred speech as well.  Patient denies any history of hepatic encephalopathy  The history is provided by the patient.       Home Medications Prior to Admission medications   Medication Sig Start Date End Date Taking? Authorizing Provider  Accu-Chek Softclix Lancets lancets Use as instructed to check blood sugar 2 times daily Dx E11.9 03/23/21   Romero Belling, MD  albuterol (VENTOLIN HFA) 108 (90 Base) MCG/ACT inhaler Inhale 2 puffs into the lungs every 4 (four) hours as needed for wheezing or shortness of breath (or coughing).    [provider]  amLODipine (NORVASC) 2.5 MG tablet Take 2.5 mg by mouth daily. 09/03/22   [provider]  aspirin EC 81 MG tablet Take 81 mg by mouth daily. Swallow whole.    [provider]  Blood Glucose Monitoring Suppl (ACCU-CHEK GUIDE ME) w/Device KIT Use as instructed to check blood sugar 2 times daily Dx E11.9 03/23/21   Romero Belling, MD  busPIRone (BUSPAR) 5 MG tablet Take 10 mg by mouth at bedtime.    [provider]  Calcium Carb-Cholecalciferol (CALCIUM CARBONATE-VITAMIN D3 PO) Take 1 tablet by mouth daily.    [provider]  celecoxib (CELEBREX) 200 MG capsule Take 1 capsule (200 mg total) by mouth 2 (two) times daily. 10/26/22 10/26/23  Marshia Ly, PA-C  COSENTYX SENSOREADY, 300 MG, 150 MG/ML SOAJ Inject 300 mg into the skin every 30 (thirty) days. 02/27/22   [provider]  docusate sodium  (COLACE) 100 MG capsule Take 1 capsule (100 mg total) by mouth 2 (two) times daily. Patient taking differently: Take 100 mg by mouth daily as needed for mild constipation. 04/15/22   Lanae Boast, MD  DULoxetine (CYMBALTA) 60 MG capsule Take 60 mg by mouth daily. 09/28/22   [provider]  FLUoxetine (PROZAC) 20 MG capsule Take 20 mg by mouth daily.    [provider]  HYDROcodone-acetaminophen (NORCO/VICODIN) 5-325 MG tablet Take 1-2 tablets by mouth every 6 (six) hours as needed for moderate pain or severe pain (post op pain). 12/18/22 12/18/23  Elodia Florence, PA-C  losartan (COZAAR) 100 MG tablet TAKE 1 TABLET BY MOUTH EVERY DAY IN THE MORNING 02/03/23   Carlus Pavlov, MD  metFORMIN (GLUCOPHAGE-XR) 500 MG 24 hr tablet Take 1 tablet (500 mg total) by mouth at bedtime. 02/14/23   Carlus Pavlov, MD  NEXIUM 40 MG capsule Take 40 mg by mouth 2 (two) times daily before a meal.    [provider]  pravastatin (PRAVACHOL) 80 MG tablet Take 80 mg by mouth daily.    [provider]  primidone (MYSOLINE) 250 MG tablet Take 250 mg by mouth at bedtime. 12/03/22   [provider]  promethazine (PHENERGAN) 12.5 MG tablet Take 1-2 tablets (12.5-25 mg total) by mouth every 6 (six) hours as needed for nausea or vomiting. 12/18/22   Elodia Florence, PA-C  QVAR REDIHALER 80 MCG/ACT inhaler Inhale  1 puff into the lungs in the morning and at bedtime.    [provider]  Semaglutide,0.25 or 0.5MG /DOS, 2 MG/3ML SOPN Inject under skin 0.5 mg weekly 02/14/23   Carlus Pavlov, MD  tiZANidine (ZANAFLEX) 2 MG tablet Take 1 tablet (2 mg total) by mouth every 8 (eight) hours as needed for muscle spasms. 10/26/22   Marshia Ly, PA-C      Allergies    Lisinopril    Review of Systems   Review of Systems  Neurological:  Positive for dizziness, weakness and numbness.  All other systems reviewed and are negative.   Physical Exam Updated Vital Signs BP (!) 176/93 (BP Location:  Left Arm)   Pulse (!) 102   Temp 97.7 F (36.5 C) (Oral)   Resp (!) 21   Ht 5' (1.524 m)   Wt 65.8 kg   LMP  (LMP Unknown)   SpO2 100%   BMI 28.32 kg/m  Physical Exam Vitals and nursing note reviewed.  Constitutional:      Comments: Chronically ill  HENT:     Head: Normocephalic.     Nose: Nose normal.     Mouth/Throat:     Mouth: Mucous membranes are moist.  Eyes:     Extraocular Movements: Extraocular movements intact.     Pupils: Pupils are equal, round, and reactive to light.  Cardiovascular:     Rate and Rhythm: Normal rate and regular rhythm.     Pulses: Normal pulses.     Heart sounds: Normal heart sounds.  Pulmonary:     Effort: Pulmonary effort is normal.     Breath sounds: Normal breath sounds.  Abdominal:     General: Abdomen is flat.     Comments: Slightly distended but nontender  Musculoskeletal:     Cervical back: Normal range of motion and neck supple.     Comments: Trace edema bilaterally  Skin:    Capillary Refill: Capillary refill takes less than 2 seconds.  Neurological:     Mental Status: She is alert.     Comments: Patient has some asterixis and baseline tremors.  No obvious facial droop or slurred speech.  Psychiatric:        Mood and Affect: Mood normal.        Behavior: Behavior normal.     ED Results / Procedures / Treatments   Labs (all labs ordered are listed, but only abnormal results are displayed) Labs Reviewed  CBG MONITORING, ED - Abnormal; Notable for the following components:      Result Value   Glucose-Capillary 166 (*)    All other components within normal limits  CBC  COMPREHENSIVE METABOLIC PANEL  AMMONIA    EKG EKG Interpretation Date/Time:  Wednesday May 15 2023 17:04:30 EDT Ventricular Rate:  104 PR Interval:    QRS Duration:  90 QT Interval:  342 QTC Calculation: 450 R Axis:   28  Text Interpretation: Normal sinus rhythm with PACs poor baseline, grossly unchanged Confirmed by Richardean Canal [phone]) on  05/15/2023 5:38:04 PM  Radiology DG Chest 1 View  Result Date: 05/15/2023 CLINICAL DATA:  Generalized weakness, left leg numbness and swelling EXAM: CHEST  1 VIEW COMPARISON:  10/26/2022 FINDINGS: Transverse diameter of heart is increased. There are no signs of pulmonary edema or focal pulmonary consolidation. There is no pleural effusion or pneumothorax. IMPRESSION: No active cardiopulmonary disease. Electronically Signed   By: Ernie Avena M.D.   On: 05/15/2023 17:54   CT HEAD WO CONTRAST (  )  Result Date: 05/15/2023 CLINICAL DATA:  Mental status change of unknown cause. Weakness. Tremors. Slurred speech. EXAM: CT HEAD WITHOUT CONTRAST TECHNIQUE: Contiguous axial images were obtained from the base of the skull through the vertex without intravenous contrast. RADIATION DOSE REDUCTION: This exam was performed according to the departmental dose-optimization program which includes automated exposure control, adjustment of the mA and/or kV according to patient size and/or use of iterative reconstruction technique. COMPARISON:  High 06/25/2022.  CT 06/25/2022. FINDINGS: Brain: Mild chronic small-vessel ischemic change of the white matter. 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 Chronic dilated perivascular spaces at the base of the brain on both sides. No cortical or large vessel territory infarction. No insult is identified is acute or subacute. No mass, hemorrhage, hydrocephalus or extra-axial collection. Vascular: There is atherosclerotic calcification of the major vessels at the base of the brain. Skull: Negative Sinuses/Orbits: Clear/normal Other: None IMPRESSION: No acute finding by CT. Mild chronic small-vessel ischemic change of the white matter. 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 Electronically Signed   By: Paulina Fusi M.D.   On: 05/15/2023 17:45    Procedures Procedures    Medications Ordered in ED Medications - No data to display  ED Course/ Medical Decision Making/  A&P                                 Medical Decision Making ONOLEE LANDECK is a 67 y.o. female here presenting with weakness and tremors.  Concern for possible hepatic encephalopathy.  Also consider head bleed and electrolyte abnormality.  Plan to get CT head and labs and ammonia level.  10:21 PM I reviewed patient's labs and patient's UA is positive for UTI.  Ammonia is normal.  Vascular ultrasound negative.  MRI showed no stroke or cauda equina.  Patient's tremors likely from UTI or not taking medicines for essential tremors.  Will discharge patient home with antibiotics.  Patient can talk to her doctor about medicines for her tremor  Problems Addressed: Acute cystitis without hematuria: acute illness or injury Tremor: acute illness or injury  Amount and/or Complexity of Data Reviewed Labs: ordered. Decision-making details documented in ED Course. Radiology: ordered and independent interpretation performed. Decision-making details documented in ED Course.  Risk Prescription drug management.    Final Clinical Impression(s) / ED Diagnoses Final diagnoses:  None    Rx / DC Orders ED Discharge Orders     None         Charlynne Pander, MD 05/15/23 2224

## 2023-05-15 NOTE — Progress Notes (Signed)
LLE venous duplex has been completed.  Preliminary results given to Dr. Silverio Lay.   Results can be found under chart review under CV PROC. 05/15/2023 7:37 PM Mirenda Baltazar RVT, RDMS

## 2023-05-15 NOTE — ED Notes (Signed)
Pt has been transported to MRI.

## 2023-05-24 ENCOUNTER — Other Ambulatory Visit: Payer: Self-pay | Admitting: Orthopaedic Surgery

## 2023-05-24 DIAGNOSIS — M25511 Pain in right shoulder: Secondary | ICD-10-CM

## 2023-06-08 ENCOUNTER — Other Ambulatory Visit: Payer: Self-pay | Admitting: Internal Medicine

## 2023-06-11 ENCOUNTER — Other Ambulatory Visit: Payer: 59

## 2023-06-13 ENCOUNTER — Other Ambulatory Visit: Payer: 59

## 2023-06-19 ENCOUNTER — Ambulatory Visit: Payer: 59 | Admitting: Internal Medicine

## 2023-06-19 NOTE — Progress Notes (Deleted)
Patient ID: Carrie [name], female   DOB: [birth year], 67 y.o.   MRN: [id number]  HPI: Carrie [name] is a 67 y.o.-year-old female, returning for follow-up for DM2, dx in 2010, non-insulin-dependent, controlled, with complications (TIA, DR, CKD, peripheral neuropathy, h/o gastroparesis). Pt. previously saw Dr. Everardo All, last visit 4 months ago.  Interim history: No increased urination, blurry vision, nausea, chest pain.   At last visit she described that she gained 29 pounds after stopping Ozempic, despite switching to Trulicity.  We restarted it. She was in the emergency room with acute cystitis with hematuria 05/15/2023.  Reviewed HbA1c: Lab Results  Component Value Date   HGBA1C 6.8 (A) 02/14/2023   HGBA1C 6.3 (H) 12/14/2022   HGBA1C 6.0 (H) 06/25/2022   HGBA1C 5.8 (A) 01/16/2022   HGBA1C 6.1 (A) 09/21/2021   HGBA1C 6.0 (A) 03/16/2021   HGBA1C 6.2 (A) 09/20/2020   HGBA1C 5.6 05/05/2020   HGBA1C 6.0 (H) 02/09/2020   HGBA1C 5.3 01/18/2020   Pt is on a regimen of: - Metformin 500 mg daily - Ozempic 1 mg weekly >> No N/V/AP/D/C >> Trulicity 3 mg weekly - changed since last  OV by rheumatology 2/2 signif. Weight loss (lowest weight 118 lbs) >> Ozempic 0.5 mg weekly She had vaginitis from [employer]. She was on multiple insulin injections 2011-2021 but had problems forgetting them - per Dr. George Hugh note.  Pt checks her sugars 2x a day and they are: - am: 90s-120s >> 90s-112 - 2h after b'fast: n/c - before lunch: n/c - 2h after lunch: n/c - before dinner: n/c - 2h after dinner: 140-170s >> 140-150s, 200 - bedtime: n/c - nighttime: n/c Lowest sugar was 90s; she has hypoglycemia awareness at 60s.  Highest sugar was 190s >> 200.  Glucometer: Accu-Chek  - + CKD, last BUN/creatinine:  Lab Results  Component Value Date   BUN 18 05/15/2023   BUN 24 (H) 12/14/2022   CREATININE 1.14 (H) 05/15/2023   CREATININE 1.24 (H) 12/14/2022  On Cozaar 100 mg daily.  -+ HL; last set of  lipids: Lab Results  Component Value Date   CHOL 139 06/25/2022   HDL 36 (L) 06/25/2022   LDLCALC 73 06/25/2022   TRIG 150 (H) 06/25/2022   CHOLHDL 3.9 06/25/2022  On pravastatin 40 mg daily.  - last eye exam was in 2024. 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 She goes to retinal specialist - on IO injections.   - + numbness and tingling in her feet. She sees neurology >> on Gabapentin - still symptomatic >> tapered it off >> Cymbalta >> helps.  Last foot exam 08/2022.  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 She had total hip replacement on 10/26/2022 after a femoral fracture. She also had right shoulder arthroscopy 12/18/2022.  ROS: + see HPI  Past Medical History:  Diagnosis Date   Anxiety    Arthritis    psoriatic arithritis, DDD    Asthma    Chronic kidney disease    CKD3a   Depression    Diabetes mellitus    type 2   Gastritis    Gastroparesis    GERD (gastroesophageal reflux disease)    Hernia    Hyperlipemia    Hyperlipemia    Hypertension    Liver cirrhosis (HCC)    Nausea and vomiting 01/06/2013   Neuropathy    Bil feet  re: to Diabetes   Obesity    Osteoporosis    Plantar fasciitis    Stricture and stenosis of esophagus    Stroke [facility])    TIA (transient ischemic attack)    8-10 yrs ago, no problems since   Tuberculosis    tested positive 2011, no symptoms, was on medicine for 6 months   Past Surgical History:  Procedure Laterality Date   BREAST SURGERY Bilateral    biopsies both breast   CARDIAC CATHETERIZATION  08/30/2010   CESAREAN SECTION     x 2    CHOLECYSTECTOMY     COLONOSCOPY     FOOT SURGERY Left    spur removal   HERNIA REPAIR     umbilical hernia repaired at the same time as cholecystectomy   IR FLUORO GUIDE CV LINE RIGHT  04/12/2021   IR US GUIDE VASC ACCESS RIGHT   04/12/2021   JOINT REPLACEMENT     bilat knee    KNEE SURGERY Bilateral    x 2 joint knee replacements   KYPHOPLASTY N/A 03/28/2022   Procedure: LUMBAR ONE KYPHOPLASTY;  Surgeon: Estill Bamberg, MD;  Location: [facility] OR;  Service: Orthopedics;  Laterality: N/A;  LUMBAR ONE KYPHOPLASTY   SHOULDER ARTHROSCOPY Right 12/18/2022   Procedure: RIGHT SHOULDER ARTHROSCOPY, ACROMIOPLASTY AND DEBRIDEMENT;  Surgeon: Marcene Corning, MD;  Location: [facility] ORS;  Service: Orthopedics;  Laterality: Right;   TOTAL HIP ARTHROPLASTY Left 10/26/2022   Procedure: TOTAL HIP ARTHROPLASTY ANTERIOR APPROACH;  Surgeon: Jodi Geralds, MD;  Location: [facility] ORS;  Service: Orthopedics;  Laterality: Left;   TOTAL KNEE REVISION Left 10/03/2021   Procedure: LEFT TOTAL KNEE REVISION POLY SWAP WITH IRRIGATION AND DEBRIDEMENT;  Surgeon: Marcene Corning, MD;  Location: [facility] OR[facility];  Service: Orthopedics;  Laterality: Left;   UPPER GASTROINTESTINAL ENDOSCOPY     WISDOM TOOTH EXTRACTION     Social History   Socioeconomic History   Marital status: Divorced    Spouse name: Not on file   Number of children: 2   Years of education: Not on file   Highest education level: Not on file  Occupational History   Occupation: Disabled   Tobacco Use   Smoking status: Never   Smokeless tobacco: Never  Vaping Use   Vaping status: Never Used  Substance and Sexual Activity   Alcohol use: Not Currently    Comment: socially   Drug use: No   Sexual activity: Not Currently    Birth control/protection: Post-menopausal  Other Topics Concern   Not on file  Social History Narrative   Sodas daily    Social Determinants of Health   Financial Resource Strain: Not on file  Food Insecurity: Low Risk  (06/13/2023)   Received from [facility]   Hunger Vital Sign    Worried About Running Out of Food in the Last Year: Never true    Ran Out of Food in the Last Year: Never true  Transportation Needs: No Transportation Needs (06/13/2023)   Received from [employer]    In the past 12 months, has lack of reliable transportation kept you from medical appointments, meetings, work or from getting things needed for daily living? : No  Physical Activity: Not on file  Stress: Not on file  Social Connections: Unknown (02/23/2022)   [facility]   Social Network    Social Network: Not on file  Intimate Partner Violence: Not At Risk (10/26/2022)   Humiliation, Afraid, Rape, and Kick questionnaire  Fear of Current or Ex-Partner: No    Emotionally Abused: No    Physically Abused: No    Sexually Abused: No   Current Outpatient Medications on File Prior to Visit  Medication Sig Dispense Refill   Accu-Chek Softclix Lancets lancets Use as instructed to check blood sugar 2 times daily Dx E11.9 200 each 0   albuterol (VENTOLIN HFA) 108 (90 Base) MCG/ACT inhaler Inhale 2 puffs into the lungs every 4 (four) hours as needed for wheezing or shortness of breath (or coughing).     amLODipine (NORVASC) 2.5 MG tablet Take 2.5 mg by mouth daily.     aspirin EC 81 MG tablet Take 81 mg by mouth daily. Swallow whole.     Blood Glucose Monitoring Suppl (ACCU-CHEK GUIDE ME) w/Device KIT Use as instructed to check blood sugar 2 times daily Dx E11.9 1 kit 0   busPIRone (BUSPAR) 5 MG tablet Take 10 mg by mouth at bedtime.     Calcium Carb-Cholecalciferol (CALCIUM CARBONATE-VITAMIN D3 PO) Take 1 tablet by mouth daily.     celecoxib (CELEBREX) 200 MG capsule Take 1 capsule (200 mg total) by mouth 2 (two) times daily. 60 capsule 0   cephALEXin (KEFLEX) 500 MG capsule Take 1 capsule (500 mg total) by mouth 3 (three) times daily. 15 capsule 0   COSENTYX SENSOREADY, 300 MG, 150 MG/ML SOAJ Inject 300 mg into the skin every 30 (thirty) days.     docusate sodium (COLACE) 100 MG capsule Take 1 capsule (100 mg total) by mouth 2 (two) times daily. (Patient taking differently: Take 100 mg by mouth daily as needed for mild constipation.) 10 capsule 0    DULoxetine (CYMBALTA) 60 MG capsule Take 60 mg by mouth daily.     FLUoxetine (PROZAC) 20 MG capsule Take 20 mg by mouth daily.     HYDROcodone-acetaminophen (NORCO/VICODIN) 5-325 MG tablet Take 1-2 tablets by mouth every 6 (six) hours as needed for moderate pain or severe pain (post op pain). 20 tablet 0   losartan (COZAAR) 100 MG tablet TAKE 1 TABLET BY MOUTH EVERY DAY IN THE MORNING 90 tablet 1   metFORMIN (GLUCOPHAGE-XR) 500 MG 24 hr tablet Take 1 tablet (500 mg total) by mouth at bedtime. 90 tablet 3   NEXIUM 40 MG capsule Take 40 mg by mouth 2 (two) times daily before a meal.     pravastatin (PRAVACHOL) 80 MG tablet Take 80 mg by mouth daily.     primidone (MYSOLINE) 250 MG tablet Take 250 mg by mouth at bedtime.     promethazine (PHENERGAN) 12.5 MG tablet Take 1-2 tablets (12.5-25 mg total) by mouth every 6 (six) hours as needed for nausea or vomiting. 30 tablet 0   QVAR REDIHALER 80 MCG/ACT inhaler Inhale 1 puff into the lungs in the morning and at bedtime.     Semaglutide,0.25 or 0.5MG /DOS, 2 MG/3ML SOPN Inject under skin 0.5 mg weekly 9 mL 3   tiZANidine (ZANAFLEX) 2 MG tablet Take 1 tablet (2 mg total) by mouth every 8 (eight) hours as needed for muscle spasms. 40 tablet 0   No current facility-administered medications on file prior to visit.   Allergies  Allergen Reactions   Lisinopril Cough   Family History  Problem Relation Age of Onset   Diabetes Other        maternal aunts and uncles   Diabetes Father    Liver disease Father    Emphysema Father        smoked  Diabetes Sister    Diabetes Brother    Sarcoidosis Brother    Emphysema Mother        smoked   Asthma Mother    Asthma Brother    Colon cancer Neg Hx    Esophageal cancer Neg Hx    Stomach cancer Neg Hx    Rectal cancer Neg Hx    PE: LMP  (LMP Unknown)  Wt Readings from Last 3 Encounters:  05/15/23 145 lb (65.8 kg)  02/14/23 147 lb (66.7 kg)  12/18/22 138 lb 14.2 oz (63 kg)   Constitutional:  overweight, in NAD Eyes:  EOMI, no exophthalmos ENT: no neck masses, no cervical lymphadenopathy Cardiovascular: RRR, No MRG Respiratory: CTA B Musculoskeletal: no deformities Skin:no rashes Neurological: + tremor with outstretched hands  ASSESSMENT: 1. DM2, non-insulin-dependent, controlled, with complications - DR - H/o TIA - CKD - PN - H/o Gastroparesis - resolved  2. HL  3.  Central obesity Central obesity  PLAN:  1. Patient with longstanding, uncontrolled, type 2 diabetes, on oral antidiabetic regimen with low-dose metformin and weekly GLP-1 receptor agonist, changed back from Trulicity to Ozempic at last visit due to significant weight gain and also to help with blood sugar control.  At last visit HbA1c were mostly at goal with hyperglycemic exceptions after dinner.  She was interested in going back to Ozempic.  I recommended to start on the lower dose and continued metformin.  - I suggested to:  Patient Instructions  Please continue: - Metformin ER 500 mg daily - Ozempic 0.5 mg weekly  Please return in 3-4 months.  - we checked her HbA1c: 7%  - advised to check sugars at different times of the day - 1x a day, rotating check times - advised for yearly eye exams >> she is UTD - return to clinic in 3-4 months  2. HL -Reviewed latest lipid panel from 06/2022: LDL slightly above goal, HDL slightly low: Lab Results  Component Value Date   CHOL 139 06/25/2022   HDL 36 (L) 06/25/2022   LDLCALC 73 06/25/2022   TRIG 150 (H) 06/25/2022   CHOLHDL 3.9 06/25/2022  -She continues pravastatin 40 mg daily without side effects  3.  Central obesity -Patient previously on Ozempic, on which she lost a significant amount of weight, down to 118 pounds.  She was recommended to switch from Ozempic to Trulicity, but she gained 29 pounds back after doing so.  Her BMI was in the overweight range but she had central obesity and because of this and her significant weight gain, I did  recommend at last visit to restart Ozempic -She only lost 2 pounds since last visit  Carlus Pavlov, MD PhD [facility] Endocrinology

## 2023-06-21 ENCOUNTER — Encounter: Payer: Self-pay | Admitting: Internal Medicine

## 2023-06-21 ENCOUNTER — Ambulatory Visit (INDEPENDENT_AMBULATORY_CARE_PROVIDER_SITE_OTHER): Payer: 59 | Admitting: Internal Medicine

## 2023-06-21 VITALS — BP 182/80 | HR 82 | Ht 60.0 in | Wt 148.2 lb

## 2023-06-21 DIAGNOSIS — Z794 Long term (current) use of insulin: Secondary | ICD-10-CM | POA: Diagnosis not present

## 2023-06-21 DIAGNOSIS — Z23 Encounter for immunization: Secondary | ICD-10-CM

## 2023-06-21 DIAGNOSIS — E65 Localized adiposity: Secondary | ICD-10-CM | POA: Diagnosis not present

## 2023-06-21 DIAGNOSIS — E1142 Type 2 diabetes mellitus with diabetic polyneuropathy: Secondary | ICD-10-CM | POA: Diagnosis not present

## 2023-06-21 DIAGNOSIS — Z7985 Long-term (current) use of injectable non-insulin antidiabetic drugs: Secondary | ICD-10-CM

## 2023-06-21 DIAGNOSIS — Z7984 Long term (current) use of oral hypoglycemic drugs: Secondary | ICD-10-CM

## 2023-06-21 DIAGNOSIS — E785 Hyperlipidemia, unspecified: Secondary | ICD-10-CM

## 2023-06-21 LAB — POCT GLYCOSYLATED HEMOGLOBIN (HGB A1C): Hemoglobin A1C: 6.4 % — AB (ref 4.0–5.6)

## 2023-06-21 LAB — MICROALBUMIN / CREATININE URINE RATIO
Creatinine,U: 59.2 mg/dL
Microalb Creat Ratio: 1.5 mg/g (ref 0.0–30.0)
Microalb, Ur: 0.9 mg/dL (ref 0.0–1.9)

## 2023-06-21 NOTE — Patient Instructions (Addendum)
Please continue: - Metformin ER 500 mg daily - Ozempic 0.5 mg weekly  STOP SWEET TEA!  Please return in 3-4 months.

## 2023-06-21 NOTE — Progress Notes (Signed)
Patient ID: Carrie [name], female   DOB: [birth year], 67 y.o.   MRN: [id number]  HPI: Carrie [name] is a 67 y.o.-year-old female, returning for follow-up for DM2, dx in 2010, non-insulin-dependent, controlled, with complications (TIA, DR, CKD, peripheral neuropathy, h/o gastroparesis). Pt. previously saw Dr. Everardo All, last visit 4 months ago.  Interim history: No increased urination, blurry vision, nausea, chest pain.   At last visit she described that she gained 29 pounds after stopping Ozempic, despite switching to Trulicity.  We restarted it. She was in the emergency room with acute cystitis with hematuria 05/15/2023.  Reviewed HbA1c: Lab Results  Component Value Date   HGBA1C 6.8 (A) 02/14/2023   HGBA1C 6.3 (H) 12/14/2022   HGBA1C 6.0 (H) 06/25/2022   HGBA1C 5.8 (A) 01/16/2022   HGBA1C 6.1 (A) 09/21/2021   HGBA1C 6.0 (A) 03/16/2021   HGBA1C 6.2 (A) 09/20/2020   HGBA1C 5.6 05/05/2020   HGBA1C 6.0 (H) 02/09/2020   HGBA1C 5.3 01/18/2020   Pt is on a regimen of: - Metformin 500 mg daily - Ozempic 1 mg weekly >> No N/V/AP/D/C >> Trulicity 3 mg weekly - changed since last  OV by rheumatology 2/2 signif. Weight loss (lowest weight 118 lbs) >> restarted Ozempic 0.5 mg weekly She had vaginitis from [employer]. She was on multiple insulin injections 2011-2021 but had problems forgetting them - per Dr. George Hugh note.  Pt checks her sugars 2x a day and they are: - am: 90s-120s >> 90s-112 >> 120-130 (sweet tea at night) - 2h after b'fast: n/c - before lunch: n/c - 2h after lunch: n/c - before dinner: n/c - 2h after dinner: 140-170s >> 140-150s, 200 >> 112-117 - bedtime: n/c - nighttime: n/c Lowest sugar was 90s >> Lo x1; she has hypoglycemia awareness at 60s.  Highest sugar was 190s >> 200 >> <200.  Glucometer: Accu-Chek  - + CKD, last BUN/creatinine:  Lab Results  Component Value Date   BUN 18 05/15/2023   BUN 24 (H) 12/14/2022   CREATININE 1.14 (H) 05/15/2023   CREATININE 1.24  (H) 12/14/2022   Lab Results  Component Value Date   MICRALBCREAT 1.7 02/16/2016  On Cozaar 100 mg daily.  -+ HL; last set of lipids: Lab Results  Component Value Date   CHOL 139 06/25/2022   HDL 36 (L) 06/25/2022   LDLCALC 73 06/25/2022   TRIG 150 (H) 06/25/2022   CHOLHDL 3.9 06/25/2022  On pravastatin 40 mg daily.  - last eye exam was in 2024. 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 She goes to retinal specialist - on IO injections.   - + numbness and tingling in her feet. She sees neurology >> on Gabapentin - still symptomatic >> tapered it off >> Cymbalta >> helps.  Last foot exam 08/2022.  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 She had total hip replacement on 10/26/2022 after a femoral fracture. She also had right shoulder arthroscopy 12/18/2022.  ROS: + see HPI  Past Medical History:  Diagnosis Date   Anxiety    Arthritis    psoriatic arithritis, DDD    Asthma    Chronic kidney disease    CKD3a   Depression    Diabetes mellitus    type 2   Gastritis    Gastroparesis    GERD (gastroesophageal reflux disease)    Hernia    Hyperlipemia  Hyperlipemia    Hypertension    Liver cirrhosis (HCC)    Nausea and vomiting 01/06/2013   Neuropathy    Bil feet re: to Diabetes   Obesity    Osteoporosis    Plantar fasciitis    Stricture and stenosis of esophagus    Stroke (HCC)    TIA (transient ischemic attack)    8-10 yrs ago, no problems since   Tuberculosis    tested positive 2011, no symptoms, was on medicine for 6 months   Past Surgical History:  Procedure Laterality Date   BREAST SURGERY Bilateral    biopsies both breast   CARDIAC CATHETERIZATION  08/30/2010   CESAREAN SECTION     x 2    CHOLECYSTECTOMY     COLONOSCOPY     FOOT SURGERY Left    spur removal   HERNIA REPAIR     umbilical  hernia repaired at the same time as cholecystectomy   IR FLUORO GUIDE CV LINE RIGHT  04/12/2021   IR US GUIDE VASC ACCESS RIGHT  04/12/2021   JOINT REPLACEMENT     bilat knee    KNEE SURGERY Bilateral    x 2 joint knee replacements   KYPHOPLASTY N/A 03/28/2022   Procedure: LUMBAR ONE KYPHOPLASTY;  Surgeon: Estill Bamberg, MD;  Location: [facility] OR;  Service: Orthopedics;  Laterality: N/A;  LUMBAR ONE KYPHOPLASTY   SHOULDER ARTHROSCOPY Right 12/18/2022   Procedure: RIGHT SHOULDER ARTHROSCOPY, ACROMIOPLASTY AND DEBRIDEMENT;  Surgeon: Marcene Corning, MD;  Location: [facility];  Service: Orthopedics;  Laterality: Right;   TOTAL HIP ARTHROPLASTY Left 10/26/2022   Procedure: TOTAL HIP ARTHROPLASTY ANTERIOR APPROACH;  Surgeon: Jodi Geralds, MD;  Location: [facility];  Service: Orthopedics;  Laterality: Left;   TOTAL KNEE REVISION Left 10/03/2021   Procedure: LEFT TOTAL KNEE REVISION POLY SWAP WITH IRRIGATION AND DEBRIDEMENT;  Surgeon: Marcene Corning, MD;  Location: [facility];  Service: Orthopedics;  Laterality: Left;   UPPER GASTROINTESTINAL ENDOSCOPY     WISDOM TOOTH EXTRACTION     Social History   Socioeconomic History   Marital status: Divorced    Spouse name: Not on file   Number of children: 2   Years of education: Not on file   Highest education level: Not on file  Occupational History   Occupation: Disabled   Tobacco Use   Smoking status: Never   Smokeless tobacco: Never  Vaping Use   Vaping status: Never Used  Substance and Sexual Activity   Alcohol use: Not Currently    Comment: socially   Drug use: No   Sexual activity: Not Currently    Birth control/protection: Post-menopausal  Other Topics Concern   Not on file  Social History Narrative   Sodas daily    Social Determinants of Health   Financial Resource Strain: Not on file  Food Insecurity: Low Risk  (06/13/2023)   Received from [facility]   Hunger Vital Sign    Worried About Running Out of Food in the Last Year: Never true     Ran Out of Food in the Last Year: Never true  Transportation Needs: No Transportation Needs (06/13/2023)   Received from [facility]    In the past 12 months, has lack of reliable transportation kept you from medical appointments, meetings, work or from getting things needed for daily living? : No  Physical Activity: Not on file  Stress: Not on file  Social Connections: Unknown (02/23/2022)   Received from [facility]  Social Network    Social Network: Not on file  Intimate Partner Violence: Not At Risk (10/26/2022)   Humiliation, Afraid, Rape, and Kick questionnaire    Fear of Current or Ex-Partner: No    Emotionally Abused: No    Physically Abused: No    Sexually Abused: No   Current Outpatient Medications on File Prior to Visit  Medication Sig Dispense Refill   Accu-Chek Softclix Lancets lancets Use as instructed to check blood sugar 2 times daily Dx E11.9 200 each 0   albuterol (VENTOLIN HFA) 108 (90 Base) MCG/ACT inhaler Inhale 2 puffs into the lungs every 4 (four) hours as needed for wheezing or shortness of breath (or coughing).     amLODipine (NORVASC) 2.5 MG tablet Take 2.5 mg by mouth daily.     aspirin EC 81 MG tablet Take 81 mg by mouth daily. Swallow whole.     Blood Glucose Monitoring Suppl (ACCU-CHEK GUIDE ME) w/Device KIT Use as instructed to check blood sugar 2 times daily Dx E11.9 1 kit 0   busPIRone (BUSPAR) 5 MG tablet Take 10 mg by mouth at bedtime.     Calcium Carb-Cholecalciferol (CALCIUM CARBONATE-VITAMIN D3 PO) Take 1 tablet by mouth daily.     celecoxib (CELEBREX) 200 MG capsule Take 1 capsule (200 mg total) by mouth 2 (two) times daily. 60 capsule 0   cephALEXin (KEFLEX) 500 MG capsule Take 1 capsule (500 mg total) by mouth 3 (three) times daily. 15 capsule 0   COSENTYX SENSOREADY, 300 MG, 150 MG/ML SOAJ Inject 300 mg into the skin every 30 (thirty) days.     docusate sodium (COLACE) 100 MG capsule Take 1 capsule (100 mg total) by mouth  2 (two) times daily. (Patient taking differently: Take 100 mg by mouth daily as needed for mild constipation.) 10 capsule 0   DULoxetine (CYMBALTA) 60 MG capsule Take 60 mg by mouth daily.     FLUoxetine (PROZAC) 20 MG capsule Take 20 mg by mouth daily.     HYDROcodone-acetaminophen (NORCO/VICODIN) 5-325 MG tablet Take 1-2 tablets by mouth every 6 (six) hours as needed for moderate pain or severe pain (post op pain). 20 tablet 0   losartan (COZAAR) 100 MG tablet TAKE 1 TABLET BY MOUTH EVERY DAY IN THE MORNING 90 tablet 1   metFORMIN (GLUCOPHAGE-XR) 500 MG 24 hr tablet Take 1 tablet (500 mg total) by mouth at bedtime. 90 tablet 3   NEXIUM 40 MG capsule Take 40 mg by mouth 2 (two) times daily before a meal.     pravastatin (PRAVACHOL) 80 MG tablet Take 80 mg by mouth daily.     primidone (MYSOLINE) 250 MG tablet Take 250 mg by mouth at bedtime.     promethazine (PHENERGAN) 12.5 MG tablet Take 1-2 tablets (12.5-25 mg total) by mouth every 6 (six) hours as needed for nausea or vomiting. 30 tablet 0   QVAR REDIHALER 80 MCG/ACT inhaler Inhale 1 puff into the lungs in the morning and at bedtime.     Semaglutide,0.25 or 0.5MG /DOS, 2 MG/3ML SOPN Inject under skin 0.5 mg weekly 9 mL 3   tiZANidine (ZANAFLEX) 2 MG tablet Take 1 tablet (2 mg total) by mouth every 8 (eight) hours as needed for muscle spasms. 40 tablet 0   No current facility-administered medications on file prior to visit.   Allergies  Allergen Reactions   Lisinopril Cough   Family History  Problem Relation Age of Onset   Diabetes Other  maternal aunts and uncles   Diabetes Father    Liver disease Father    Emphysema Father        smoked   Diabetes Sister    Diabetes Brother    Sarcoidosis Brother    Emphysema Mother        smoked   Asthma Mother    Asthma Brother    Colon cancer Neg Hx    Esophageal cancer Neg Hx    Stomach cancer Neg Hx    Rectal cancer Neg Hx    PE: BP (!) 182/80   Pulse 82   Ht 5' (1.524 m)    Wt 148 lb 3.2 oz (67.2 kg)   LMP  (LMP Unknown)   SpO2 91%   BMI 28.94 kg/m  Wt Readings from Last 3 Encounters:  06/21/23 148 lb 3.2 oz (67.2 kg)  05/15/23 145 lb (65.8 kg)  02/14/23 147 lb (66.7 kg)   Constitutional: overweight, in NAD Eyes:  EOMI, no exophthalmos ENT: no neck masses, no cervical lymphadenopathy Cardiovascular: RRR, No MRG Respiratory: CTA B Musculoskeletal: no deformities Skin:no rashes Neurological: + tremor with outstretched hands Diabetic Foot Exam - Simple   Simple Foot Form Diabetic Foot exam was performed with the following findings: Yes 06/21/2023  1:55 PM  Visual Inspection No deformities, no ulcerations, no other skin breakdown bilaterally: Yes Sensation Testing See comments: Yes Pulse Check See comments: Yes Comments Minimal sensation to monofilament Decreased pedal pulses    ASSESSMENT: 1. DM2, non-insulin-dependent, controlled, with complications - DR - H/o TIA - CKD - PN - H/o Gastroparesis - resolved  2. HL  3.  Central obesity  PLAN:  1. Patient with longstanding, uncontrolled, type 2 diabetes, on oral antidiabetic regimen with low-dose metformin and weekly GLP-1 receptor agonist, changed back from Trulicity to Ozempic at last visit due to significant weight gain and also to help with blood sugar control.  At last visit, HbA1c was 6.8%, and sugars were mostly at goal with a hyperglycemic exception after dinner.  She was interested in going back to Ozempic.  I recommended to start on the lower dose and continued metformin. -At today's, sugars are at goal in the morning but close to the upper limit of the target range.  At bedtime, sugars are improved, excellent.  Upon questioning, she wakes up in the middle of the night and drinks sweet tea.  I strongly advised her to stop this.  Otherwise, we can continue the rest of the regimen. - I suggested to:  Patient Instructions  Please continue: - Metformin ER 500 mg daily - Ozempic 0.5 mg  weekly  STOP SWEET TEA!  Please return in 3-4 months.  - we checked her HbA1c: 6.4% (lower) - advised to check sugars at different times of the day - 1x a day, rotating check times - advised for yearly eye exams >> she is UTD - return to clinic in 3-4 months  2. HL -Reviewed latest lipid panel from 06/2022: LDL slightly above goal, HDL slightly low: Lab Results  Component Value Date   CHOL 139 06/25/2022   HDL 36 (L) 06/25/2022   LDLCALC 73 06/25/2022   TRIG 150 (H) 06/25/2022   CHOLHDL 3.9 06/25/2022  -She continues atorvastatin 40 mg daily, without side effects. -She is due for another lipid panel but she tells me that she had labs outside.  Will try to obtain these.  3.  Central obesity -Patient previously on Ozempic, on which she lost a significant amount  of weight, down to 118 pounds.  She was recommended to switch from Ozempic to Trulicity, but she gained 29 pounds back after doing so.  Her BMI was in the overweight range but she had central obesity and because of this and her significant weight gain, I did recommend at last visit to restart Ozempic -She only lost 2 pounds since last visit -Since last visit, she gained 1 pound.  + Flu shot today.  Carlus Pavlov, MD PhD [facility] Endocrinology

## 2023-07-04 ENCOUNTER — Other Ambulatory Visit: Payer: 59

## 2023-07-07 ENCOUNTER — Ambulatory Visit
Admission: RE | Admit: 2023-07-07 | Discharge: 2023-07-07 | Disposition: A | Discharge status: Home / Self Care | Payer: 59 | Source: Ambulatory Visit | Attending: Orthopaedic Surgery | Admitting: Orthopaedic Surgery

## 2023-07-07 DIAGNOSIS — M25511 Pain in right shoulder: Secondary | ICD-10-CM

## 2023-08-12 ENCOUNTER — Other Ambulatory Visit: Payer: Self-pay | Admitting: Infectious Diseases

## 2023-08-14 NOTE — Telephone Encounter (Signed)
Do you want to continue?

## 2023-08-15 ENCOUNTER — Other Ambulatory Visit: Payer: Self-pay

## 2023-08-15 DIAGNOSIS — E1165 Type 2 diabetes mellitus with hyperglycemia: Secondary | ICD-10-CM

## 2023-08-15 MED ORDER — METFORMIN HCL ER 500 MG PO TB24: 500.0000 mg | ORAL_TABLET | Freq: Every day | ORAL | 3 refills | Status: DC

## 2023-08-15 MED ORDER — BLOOD GLUCOSE TEST STRIPS 333 VI STRP: 1.0000 | ORAL_STRIP | Freq: Two times a day (BID) | 0 refills | Status: DC

## 2023-08-25 ENCOUNTER — Other Ambulatory Visit: Payer: Self-pay

## 2023-08-25 ENCOUNTER — Encounter (HOSPITAL_COMMUNITY): Payer: Self-pay | Admitting: Pharmacy Technician

## 2023-08-25 ENCOUNTER — Emergency Department (HOSPITAL_COMMUNITY)
Admission: EM | Admit: 2023-08-25 | Discharge: 2023-08-25 | Disposition: A | Discharge status: Home / Self Care | Payer: 59 | Attending: Emergency Medicine | Admitting: Emergency Medicine

## 2023-08-25 ENCOUNTER — Emergency Department (HOSPITAL_COMMUNITY): Payer: 59

## 2023-08-25 DIAGNOSIS — W2103XA Struck by baseball, initial encounter: Secondary | ICD-10-CM | POA: Insufficient documentation

## 2023-08-25 DIAGNOSIS — Z7982 Long term (current) use of aspirin: Secondary | ICD-10-CM | POA: Diagnosis not present

## 2023-08-25 DIAGNOSIS — S060X0A Concussion without loss of consciousness, initial encounter: Secondary | ICD-10-CM | POA: Diagnosis not present

## 2023-08-25 DIAGNOSIS — S0990XA Unspecified injury of head, initial encounter: Secondary | ICD-10-CM

## 2023-08-25 DIAGNOSIS — Y9364 Activity, baseball: Secondary | ICD-10-CM | POA: Insufficient documentation

## 2023-08-25 DIAGNOSIS — Y92320 Baseball field as the place of occurrence of the external cause: Secondary | ICD-10-CM | POA: Insufficient documentation

## 2023-08-25 MED ORDER — HYDROCODONE-ACETAMINOPHEN 5-325 MG PO TABS
1.0000 | ORAL_TABLET | Freq: Once | ORAL | Status: AC
  Administered 2023-08-25: 1 via ORAL
  Filled 2023-08-25: qty 1

## 2023-08-25 MED ORDER — ONDANSETRON 8 MG PO TBDP
8.0000 mg | ORAL_TABLET | Freq: Once | ORAL | Status: AC
  Administered 2023-08-25: 8 mg via ORAL
  Filled 2023-08-25: qty 1

## 2023-08-25 NOTE — ED Triage Notes (Signed)
Pt here POV with reports of getting hit on the top of her head by a baseball. Denies LOC, denies blood thinners.

## 2023-08-25 NOTE — Discharge Instructions (Signed)
CT scan did not show any concern.  Likely may develop a concussion.  Mild headaches, headaches worsened by light or activity, occasional nausea and vomiting or expected.  For severe nausea and vomiting, severe headache, vision change return for evaluation.  Concussion clinic listed above in case your symptoms last longer than a week.  Take Tylenol ibuprofen as you need to.  Follow-up with your PCP.

## 2023-08-25 NOTE — ED Provider Notes (Signed)
[place] EMERGENCY DEPARTMENT AT [facility] Provider Note   CSN: [id number] Arrival date & time: 08/25/23  1520     History  Chief Complaint  Patient presents with   Head Injury    Carrie [name] is a 67 y.o. female.  67 year old female presents today for concern of trauma to the head.  She was at a baseball field when she was struck by a high flying ball.  She was unable to get out from underneath the ball in time.  No loss of consciousness.  Not on anticoagulation.  No other injury.  The history is provided by the patient. No language interpreter was used.       Home Medications Prior to Admission medications   Medication Sig Start Date End Date Taking? Authorizing Provider  Accu-Chek Softclix Lancets lancets Use as instructed to check blood sugar 2 times daily Dx E11.9 03/23/21   Romero Belling, MD  albuterol (VENTOLIN HFA) 108 (90 Base) MCG/ACT inhaler Inhale 2 puffs into the lungs every 4 (four) hours as needed for wheezing or shortness of breath (or coughing).    [provider]  amLODipine (NORVASC) 2.5 MG tablet Take 2.5 mg by mouth daily. 09/03/22   [provider]  aspirin EC 81 MG tablet Take 81 mg by mouth daily. Swallow whole.    [provider]  Blood Glucose Monitoring Suppl (ACCU-CHEK GUIDE ME) w/Device KIT Use as instructed to check blood sugar 2 times daily Dx E11.9 03/23/21   Romero Belling, MD  busPIRone (BUSPAR) 5 MG tablet Take 10 mg by mouth at bedtime.    [provider]  Calcium Carb-Cholecalciferol (CALCIUM CARBONATE-VITAMIN D3 PO) Take 1 tablet by mouth daily.    [provider]  celecoxib (CELEBREX) 200 MG capsule Take 1 capsule (200 mg total) by mouth 2 (two) times daily. 10/26/22 10/26/23  Marshia Ly, PA-C  cephALEXin (KEFLEX) 500 MG capsule Take 1 capsule (500 mg total) by mouth 3 (three) times daily. 05/15/23   Charlynne Pander, MD  COSENTYX SENSOREADY, 300 MG, 150 MG/ML SOAJ Inject 300 mg  into the skin every 30 (thirty) days. 02/27/22   [provider]  docusate sodium (COLACE) 100 MG capsule Take 1 capsule (100 mg total) by mouth 2 (two) times daily. Patient taking differently: Take 100 mg by mouth daily as needed for mild constipation. 04/15/22   Lanae Boast, MD  DULoxetine (CYMBALTA) 60 MG capsule Take 60 mg by mouth daily. 09/28/22   [provider]  FLUoxetine (PROZAC) 20 MG capsule Take 20 mg by mouth daily.    [provider]  Glucose Blood (BLOOD GLUCOSE TEST STRIPS 333) STRP 1 strip by In Vitro route 2 (two) times daily. ACCU CHEK GUIDE TEST STRIPS 08/15/23   Motwani, Carin Hock, MD  losartan (COZAAR) 100 MG tablet TAKE 1 TABLET BY MOUTH EVERY DAY IN THE MORNING 02/03/23   Carlus Pavlov, MD  metFORMIN (GLUCOPHAGE-XR) 500 MG 24 hr tablet Take 1 tablet (500 mg total) by mouth at bedtime. 08/15/23   Motwani, Carin Hock, MD  NEXIUM 40 MG capsule Take 40 mg by mouth 2 (two) times daily before a meal.    [provider]  pravastatin (PRAVACHOL) 80 MG tablet Take 80 mg by mouth daily.    [provider]  primidone (MYSOLINE) 250 MG tablet Take 250 mg by mouth at bedtime. 12/03/22   [provider]  QVAR REDIHALER 80 MCG/ACT inhaler Inhale 1 puff into the lungs in the morning  and at bedtime.    [provider]  Semaglutide,0.25 or 0.5MG /DOS, 2 MG/3ML SOPN Inject under skin 0.5 mg weekly 02/14/23   Carlus Pavlov, MD      Allergies    Lisinopril    Review of Systems   Review of Systems  Constitutional:  Negative for chills and fever.  Eyes:  Negative for visual disturbance.  Neurological:  Positive for headaches. Negative for syncope and light-headedness.  All other systems reviewed and are negative.   Physical Exam Updated Vital Signs BP (!) 188/98 (BP Location: Left Arm)   Pulse 87   Temp 98.2 F (36.8 C) (Oral)   Resp 16   LMP  (LMP Unknown)   SpO2 100%  Physical Exam Vitals and nursing note reviewed.   Constitutional:      General: She is not in acute distress.    Appearance: Normal appearance. She is not ill-appearing.  HENT:     Head: Normocephalic and atraumatic.     Nose: Nose normal.  Eyes:     Conjunctiva/sclera: Conjunctivae normal.  Cardiovascular:     Rate and Rhythm: Normal rate.  Pulmonary:     Effort: Pulmonary effort is normal. No respiratory distress.  Musculoskeletal:        General: No deformity.  Skin:    Findings: No rash.  Neurological:     General: No focal deficit present.     Mental Status: She is alert and oriented to person, place, and time. Mental status is at baseline.     Cranial Nerves: No cranial nerve deficit.     Motor: No weakness.     ED Results / Procedures / Treatments   Labs (all labs ordered are listed, but only abnormal results are displayed) Labs Reviewed - No data to display  EKG None  Radiology CT Head Wo Contrast  Result Date: 08/25/2023 CLINICAL DATA:  Trauma hit on top of head with a baseball EXAM: CT HEAD WITHOUT CONTRAST TECHNIQUE: Contiguous axial images were obtained from the base of the skull through the vertex without intravenous contrast. RADIATION DOSE REDUCTION: This exam was performed according to the departmental dose-optimization program which includes automated exposure control, adjustment of the mA and/or kV according to patient size and/or use of iterative reconstruction technique. COMPARISON:  05/15/2023 FINDINGS: Brain: No evidence of acute infarction, hemorrhage, hydrocephalus, extra-axial collection or mass lesion/mass effect. Mild periventricular white matter hypodensity. Vascular: No hyperdense vessel or unexpected calcification. Skull: Normal. Negative for fracture or focal lesion. Sinuses/Orbits: No acute finding. Other: Soft tissue contusion to the left frontal scalp (series 2, image 27). IMPRESSION: 1. No acute intracranial pathology. 2. Soft tissue contusion to the left frontal scalp. No fracture.  Electronically Signed   By: Jearld Lesch M.D.   On: 08/25/2023 17:24    Procedures Procedures    Medications Ordered in ED Medications  HYDROcodone-acetaminophen (NORCO/VICODIN) 5-325 MG per tablet 1 tablet (1 tablet Oral Given 08/25/23 1601)  ondansetron (ZOFRAN-ODT) disintegrating tablet 8 mg (8 mg Oral Given 08/25/23 1601)    ED Course/ Medical Decision Making/ A&P                                 Medical Decision Making Amount and/or Complexity of Data Reviewed Radiology: ordered.  Risk Prescription drug management.   67 year old female presents today for concern of head injury.  No loss of consciousness.  Not on anticoagulation.  CT head obtained.  No  acute intracranial finding.  Neurological exam without any focal deficits.  Concussion symptoms and expectations discussed.  Strict return precautions given.  Discharged in stable condition.   Final Clinical Impression(s) / ED Diagnoses Final diagnoses:  Injury of head, initial encounter  Concussion without loss of consciousness, initial encounter    Rx / DC Orders ED Discharge Orders     None         Marita Kansas, PA-C 08/25/23 1951    Alvira Monday, MD 08/26/23 2221

## 2023-09-25 ENCOUNTER — Other Ambulatory Visit: Payer: Self-pay | Admitting: Internal Medicine

## 2023-10-01 LAB — MICROALBUMIN / CREATININE URINE RATIO: Microalb Creat Ratio: 23

## 2023-10-21 ENCOUNTER — Other Ambulatory Visit: Payer: Self-pay | Admitting: Rheumatology

## 2023-10-21 DIAGNOSIS — M810 Age-related osteoporosis without current pathological fracture: Secondary | ICD-10-CM | POA: Insufficient documentation

## 2023-10-23 ENCOUNTER — Telehealth: Payer: Self-pay

## 2023-10-23 ENCOUNTER — Encounter: Payer: Self-pay | Admitting: Internal Medicine

## 2023-10-23 ENCOUNTER — Encounter: Payer: Self-pay | Admitting: Rheumatology

## 2023-10-23 ENCOUNTER — Ambulatory Visit: Payer: 59 | Admitting: Internal Medicine

## 2023-10-23 VITALS — BP 136/70 | HR 95 | Ht 60.0 in | Wt 151.6 lb

## 2023-10-23 DIAGNOSIS — E1142 Type 2 diabetes mellitus with diabetic polyneuropathy: Secondary | ICD-10-CM | POA: Diagnosis not present

## 2023-10-23 DIAGNOSIS — E785 Hyperlipidemia, unspecified: Secondary | ICD-10-CM | POA: Diagnosis not present

## 2023-10-23 DIAGNOSIS — Z794 Long term (current) use of insulin: Secondary | ICD-10-CM

## 2023-10-23 DIAGNOSIS — E65 Localized adiposity: Secondary | ICD-10-CM | POA: Diagnosis not present

## 2023-10-23 LAB — POCT GLYCOSYLATED HEMOGLOBIN (HGB A1C): Hemoglobin A1C: 7.9 % — AB (ref 4.0–5.6)

## 2023-10-23 MED ORDER — ACCU-CHEK SOFTCLIX LANCETS MISC: 3 refills | Status: AC

## 2023-10-23 MED ORDER — ACCU-CHEK GUIDE ME W/DEVICE KIT: PACK | 0 refills | Status: DC

## 2023-10-23 MED ORDER — OZEMPIC (1 MG/DOSE) 4 MG/3ML ~~LOC~~ SOPN: 1.0000 mg | PEN_INJECTOR | SUBCUTANEOUS | 3 refills | Status: DC

## 2023-10-23 NOTE — Progress Notes (Signed)
 Patient ID: Carrie [name], female   DOB: 1956/03/23, 68 y.o.   MRN: [id number]  HPI: Carrie [name] is a 68 y.o.-year-old female, returning for follow-up for DM2, dx in 2010, non-insulin -dependent, controlled, with complications (TIA, DR, CKD, peripheral neuropathy, h/o gastroparesis). Pt. previously saw Dr. Kassie, last visit 4 months ago.  Interim history: No increased urination, blurry vision, nausea, chest pain.  She is frustrated with weight gain since last visit.   She has not been checking blood sugars in the last few weeks, as her glucometer is defective.  Reviewed HbA1c: Lab Results  Component Value Date   HGBA1C 6.4 (A) 06/21/2023   HGBA1C 6.8 (A) 02/14/2023   HGBA1C 6.3 (H) 12/14/2022   HGBA1C 6.0 (H) 06/25/2022   HGBA1C 5.8 (A) 01/16/2022   HGBA1C 6.1 (A) 09/21/2021   HGBA1C 6.0 (A) 03/16/2021   HGBA1C 6.2 (A) 09/20/2020   HGBA1C 5.6 05/05/2020   HGBA1C 6.0 (H) 02/09/2020   Pt is on a regimen of: - Metformin  500 mg daily - Ozempic  1 mg weekly >> No N/V/AP/D/C >> Trulicity  3 mg weekly - changed since last  OV by rheumatology 2/2 signif. Weight loss (lowest weight 118 lbs) >> restarted Ozempic  0.5 mg weekly She had vaginitis from [employer]. She was on multiple insulin  injections 2011-2021 but had problems forgetting them - per Dr. Laymond note.  Pt was checking  her sugars 2x a day - not in last few weeks - meter broke.  Before this: - am: 90s-120s >> 90s-112 >> 120-130 () >> 110-117 - 2h after b'fast: n/c - before lunch: n/c - 2h after lunch: n/c - before dinner: n/c - 2h after dinner: 140-170s >> 140-150s, 200 >> 112-117 >> n/c - bedtime: n/c - nighttime: n/c Lowest sugar was 90s >> Lo x1 >> 100; she has hypoglycemia awareness at 60s.  Highest sugar was 190s >> 200 >> <200 >> 173 right after eating.  Glucometer: Accu-Chek  - + CKD, last BUN/creatinine:   Lab Results  Component Value Date   BUN 18 05/15/2023   BUN 24 (H) 12/14/2022   CREATININE 1.14 (H)  05/15/2023   CREATININE 1.24 (H) 12/14/2022   ACR:   Lab Results  Component Value Date   MICRALBCREAT 1.5 06/21/2023   MICRALBCREAT 1.7 02/16/2016  On Cozaar  100 mg daily.  -+ HL; last set of lipids:  Lab Results  Component Value Date   CHOL 139 06/25/2022   HDL 36 (L) 06/25/2022   LDLCALC 73 06/25/2022   TRIG 150 (H) 06/25/2022   CHOLHDL 3.9 06/25/2022  On pravastatin  40 mg daily.  - last eye exam was in 2024. 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 She goes to retinal specialist - on IO injections.   - + numbness and tingling in her feet. She sees neurology >> on Gabapentin  - still symptomatic >> tapered it off >> Cymbalta  >> helps.  Last foot exam 06/21/2023.  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 She had total hip replacement on 10/26/2022 after a femoral fracture. She also had right shoulder arthroscopy 12/18/2022. She was in the emergency room with acute cystitis with hematuria 05/15/2023.  ROS: + see HPI  Past Medical History:  Diagnosis Date   Anxiety    Arthritis    psoriatic arithritis, DDD    Asthma    Chronic kidney disease    CKD3a  Depression    Diabetes mellitus    type 2   Gastritis    Gastroparesis    GERD (gastroesophageal reflux disease)    Hernia    Hyperlipemia    Hyperlipemia    Hypertension    Liver cirrhosis (HCC)    Nausea and vomiting 01/06/2013   Neuropathy    Bil feet re: to Diabetes   Obesity    Osteoporosis    Plantar fasciitis    Stricture and stenosis of esophagus    Stroke (HCC)    TIA (transient ischemic attack)    8-10 yrs ago, no problems since   Tuberculosis    tested positive 2011, no symptoms, was on medicine for 6 months   Past Surgical History:  Procedure Laterality Date   BREAST SURGERY Bilateral    biopsies both breast   CARDIAC CATHETERIZATION   08/30/2010   CESAREAN SECTION     x 2    CHOLECYSTECTOMY     COLONOSCOPY     FOOT SURGERY Left    spur removal   HERNIA REPAIR     umbilical hernia repaired at the same time as cholecystectomy   IR FLUORO GUIDE CV LINE RIGHT  04/12/2021   IR US  GUIDE VASC ACCESS RIGHT  04/12/2021   JOINT REPLACEMENT     bilat knee    KNEE SURGERY Bilateral    x 2 joint knee replacements   KYPHOPLASTY N/A 03/28/2022   Procedure: LUMBAR ONE KYPHOPLASTY;  Surgeon: Beuford Anes, MD;  Location: [facility] OR;  Service: Orthopedics;  Laterality: N/A;  LUMBAR ONE KYPHOPLASTY   SHOULDER ARTHROSCOPY Right 12/18/2022   Procedure: RIGHT SHOULDER ARTHROSCOPY, ACROMIOPLASTY AND DEBRIDEMENT;  Surgeon: Sheril Coy, MD;  Location: [facility];  Service: Orthopedics;  Laterality: Right;   TOTAL HIP ARTHROPLASTY Left 10/26/2022   Procedure: TOTAL HIP ARTHROPLASTY ANTERIOR APPROACH;  Surgeon: Yvone Rush, MD;  Location: [facility];  Service: Orthopedics;  Laterality: Left;   TOTAL KNEE REVISION Left 10/03/2021   Procedure: LEFT TOTAL KNEE REVISION POLY SWAP WITH IRRIGATION AND DEBRIDEMENT;  Surgeon: Sheril Coy, MD;  Location: [facility];  Service: Orthopedics;  Laterality: Left;   UPPER GASTROINTESTINAL ENDOSCOPY     WISDOM TOOTH EXTRACTION     Social History   Socioeconomic History   Marital status: Divorced    Spouse name: Not on file   Number of children: 2   Years of education: Not on file   Highest education level: Not on file  Occupational History   Occupation: Disabled   Tobacco Use   Smoking status: Never   Smokeless tobacco: Never  Vaping Use   Vaping status: Never Used  Substance and Sexual Activity   Alcohol use: Not Currently    Comment: socially   Drug use: No   Sexual activity: Not Currently    Birth control/protection: Post-menopausal  Other Topics Concern   Not on file  Social History Narrative   Sodas daily    Social Drivers of Health   Financial Resource Strain: Not on file  Food  Insecurity: Low Risk  (06/13/2023)   Received from [facility]   Hunger Vital Sign    Worried About Running Out of Food in the Last Year: Never true    Ran Out of Food in the Last Year: Never true  Transportation Needs: No Transportation Needs (06/13/2023)   Received from [facility]    In the past 12 months, has lack of reliable transportation kept you from medical appointments,  meetings, work or from getting things needed for daily living? : No  Physical Activity: Not on file  Stress: Not on file  Social Connections: Unknown (02/23/2022)   Received from [facility]   Social Network    Social Network: Not on file  Intimate Partner Violence: Not At Risk (10/26/2022)   Humiliation, Afraid, Rape, and Kick questionnaire    Fear of Current or Ex-Partner: No    Emotionally Abused: No    Physically Abused: No    Sexually Abused: No   Current Outpatient Medications on File Prior to Visit  Medication Sig Dispense Refill   Accu-Chek Softclix Lancets lancets Use as instructed to check blood sugar 2 times daily Dx E11.9 200 each 0   albuterol  (VENTOLIN  HFA) 108 (90 Base) MCG/ACT inhaler Inhale 2 puffs into the lungs every 4 (four) hours as needed for wheezing or shortness of breath (or coughing).     amLODipine  (NORVASC ) 2.5 MG tablet Take 2.5 mg by mouth daily.     aspirin  EC 81 MG tablet Take 81 mg by mouth daily. Swallow whole.     Blood Glucose Monitoring Suppl (ACCU-CHEK GUIDE ME) w/Device KIT Use as instructed to check blood sugar 2 times daily Dx E11.9 1 kit 0   busPIRone  (BUSPAR ) 5 MG tablet Take 10 mg by mouth at bedtime.     Calcium  Carb-Cholecalciferol (CALCIUM  CARBONATE-VITAMIN D3 PO) Take 1 tablet by mouth daily.     celecoxib  (CELEBREX ) 200 MG capsule Take 1 capsule (200 mg total) by mouth 2 (two) times daily. 60 capsule 0   cephALEXin  (KEFLEX ) 500 MG capsule Take 1 capsule (500 mg total) by mouth 3 (three) times daily. 15 capsule 0   COSENTYX  SENSOREADY, 300 MG, 150 MG/ML SOAJ Inject 300 mg into the skin every 30 (thirty) days.     docusate sodium  (COLACE) 100 MG capsule Take 1 capsule (100 mg total) by mouth 2 (two) times daily. (Patient taking differently: Take 100 mg by mouth daily as needed for mild constipation.) 10 capsule 0   DULoxetine  (CYMBALTA ) 60 MG capsule Take 60 mg by mouth daily.     FLUoxetine  (PROZAC ) 20 MG capsule Take 20 mg by mouth daily.     Glucose Blood (BLOOD GLUCOSE TEST STRIPS 333) STRP 1 strip by In Vitro route 2 (two) times daily. ACCU CHEK GUIDE TEST STRIPS 100 strip 0   losartan  (COZAAR ) 100 MG tablet TAKE 1 TABLET BY MOUTH EVERY DAY IN THE MORNING 90 tablet 1   metFORMIN  (GLUCOPHAGE -XR) 500 MG 24 hr tablet Take 1 tablet (500 mg total) by mouth at bedtime. 90 tablet 3   NEXIUM  40 MG capsule Take 40 mg by mouth 2 (two) times daily before a meal.     pravastatin  (PRAVACHOL ) 80 MG tablet Take 80 mg by mouth daily.     primidone  (MYSOLINE ) 250 MG tablet Take 250 mg by mouth at bedtime.     QVAR REDIHALER 80 MCG/ACT inhaler Inhale 1 puff into the lungs in the morning and at bedtime.     Semaglutide ,0.25 or 0.5MG /DOS, 2 MG/3ML SOPN Inject under skin 0.5 mg weekly 9 mL 3   No current facility-administered medications on file prior to visit.   Allergies  Allergen Reactions   Lisinopril Cough   Family History  Problem Relation Age of Onset   Diabetes Other        maternal aunts and uncles   Diabetes Father    Liver disease Father  Emphysema Father        smoked   Diabetes Sister    Diabetes Brother    Sarcoidosis Brother    Emphysema Mother        smoked   Asthma Mother    Asthma Brother    Colon cancer Neg Hx    Esophageal cancer Neg Hx    Stomach cancer Neg Hx    Rectal cancer Neg Hx    PE: BP 136/70   Pulse 95   Ht 5' (1.524 m)   Wt 151 lb 9.6 oz (68.8 kg)   LMP  (LMP Unknown)   SpO2 97%   BMI 29.61 kg/m  Wt Readings from Last 3 Encounters:  10/23/23 151 lb 9.6 oz (68.8 kg)   06/21/23 148 lb 3.2 oz (67.2 kg)  05/15/23 145 lb (65.8 kg)   Constitutional: overweight, in NAD Eyes:  EOMI, no exophthalmos ENT: no neck masses, no cervical lymphadenopathy Cardiovascular: tachycardia, RR, No MRG Respiratory: CTA B Musculoskeletal: no deformities Skin:no rashes Neurological: no tremor with outstretched hands  ASSESSMENT: 1. DM2, non-insulin -dependent, controlled, with complications - DR - H/o TIA - CKD - PN - H/o Gastroparesis - resolved  2. HL  3.  Central obesity  PLAN:  1. Patient with longstanding, uncontrolled, type 2 diabetes, on oral antidiabetic regimen with low-dose metformin  and weekly GLP-1 receptor agonist, changed back from Trulicity  to Ozempic  due to weight gain and also to help with blood sugar control.  At last visit, sugars were at goal in the morning but they were close to the upper limit of the target range.  At that time sugars were improved, excellent.  Upon questioning, she was waking up in the middle of the night drinking sweet tea.  I advised her to stop this.  We did not change the regimen otherwise.  HbA1c was better decreased from 6.8%. -At today's visit, she tells me that she was able to stop sweet tea at night. -She is currently not checking blood sugars after her meter broke a few weeks ago.  We discussed that she should have alerted us  that we could have called in another device.  At today's visit I called in an Accu-Chek guide machine along with lancets.  She does have strips at home. -Based on the elevated HbA1c today (see below), and also her weight gain, we decided to increase her Ozempic  dose.  However, I strongly advised her to start checking blood sugars at least once greatly help in improving blood sugars.  I also advised her to let me know if she loses excessive weight, as in that case we may need to decrease the Ozempic  dose. - I suggested to:  Patient Instructions  Please continue: - Metformin  ER 500 mg daily  You can  increase: - Ozempic  1 mg weekly  Restart checking blood sugars, rotating check times.  Please return in 4 months.  - we checked her HbA1c: 7.9% (higher) - advised to check sugars at different times of the day - 1-2x a day, rotating check times - advised for yearly eye exams >> she is UTD - return to clinic in 4 months  2. HL -Latest panel was reviewed from 10/01/2023: HDL slightly low, LDL above target of less than 55 due to cardiovascular disease:  -She continues atorvastatin  40 mg daily without side effects  3.  Central obesity -She lost a significant amount of weight on Ozempic  before, down to 118 pounds.  We did discuss about switching from Ozempic  to  Trulicity  but she gained 29 pounds back after doing so.  Her BMI was in the overweight range but she had central obesity so because of this and the significant weight gain, we started back Ozempic . -Weight was approximately stable at the last 2 visits -Since last visit she gained 3 pounds -central disposition  Lela Fendt, MD PhD [facility]ocr[facility]

## 2023-10-23 NOTE — Patient Instructions (Addendum)
 Please continue: - Metformin ER 500 mg daily  You can increase: - Ozempic 1 mg weekly  Restart checking blood sugars, rotating check times.  Please return in 4 months.

## 2023-10-23 NOTE — Telephone Encounter (Signed)
 Auth Submission: APPROVED  Site of care: Site of care: [facility] Payer: [employer] Dual complete Medication & CPT/J Code(s) submitted: Prolia  (Denosumab ) [id number] Route of submission (phone, fax, portal): Portal Phone # Fax # Auth type: Buy/Bill PB Units/visits requested: 60mg  x 2 doses Reference number: [id number] Approval from: 10/23/23 to 10/22/24  Patient was receiving Prolia  in her PCP office. This PA was submitted as a continuation of therapy. Next dose due 02/06/24.

## 2023-11-27 ENCOUNTER — Encounter: Payer: Self-pay | Admitting: Gastroenterology

## 2023-12-20 ENCOUNTER — Telehealth: Payer: Self-pay

## 2023-12-20 NOTE — Telephone Encounter (Signed)
Error

## 2023-12-24 ENCOUNTER — Other Ambulatory Visit: Payer: Self-pay | Admitting: Internal Medicine

## 2023-12-27 ENCOUNTER — Other Ambulatory Visit: Payer: Self-pay | Admitting: "Endocrinology

## 2023-12-27 DIAGNOSIS — E1165 Type 2 diabetes mellitus with hyperglycemia: Secondary | ICD-10-CM

## 2024-01-12 ENCOUNTER — Other Ambulatory Visit: Payer: Self-pay | Admitting: Internal Medicine

## 2024-01-22 ENCOUNTER — Other Ambulatory Visit: Payer: Self-pay | Admitting: Internal Medicine

## 2024-02-20 ENCOUNTER — Encounter: Payer: Self-pay | Admitting: Internal Medicine

## 2024-02-20 ENCOUNTER — Ambulatory Visit: Payer: 59 | Admitting: Internal Medicine

## 2024-02-20 DIAGNOSIS — Z794 Long term (current) use of insulin: Secondary | ICD-10-CM

## 2024-02-20 DIAGNOSIS — Z7985 Long-term (current) use of injectable non-insulin antidiabetic drugs: Secondary | ICD-10-CM

## 2024-02-20 DIAGNOSIS — E1142 Type 2 diabetes mellitus with diabetic polyneuropathy: Secondary | ICD-10-CM

## 2024-02-20 DIAGNOSIS — Z7984 Long term (current) use of oral hypoglycemic drugs: Secondary | ICD-10-CM

## 2024-02-20 LAB — POCT GLYCOSYLATED HEMOGLOBIN (HGB A1C): Hemoglobin A1C: 8.2 % — AB (ref 4.0–5.6)

## 2024-02-20 MED ORDER — OZEMPIC (1 MG/DOSE) 4 MG/3ML ~~LOC~~ SOPN: 1.0000 mg | PEN_INJECTOR | SUBCUTANEOUS | 3 refills | Status: DC

## 2024-02-20 MED ORDER — OZEMPIC (1 MG/DOSE) 4 MG/3ML ~~LOC~~ SOPN: 1.0000 mg | PEN_INJECTOR | SUBCUTANEOUS | 3 refills | Status: AC

## 2024-02-20 NOTE — Progress Notes (Signed)
 Patient ID: Carrie [name], female   DOB: 22-Jun-1956, 68 y.o.   MRN: [id number]  HPI: Carrie [name] is a 68 y.o.-year-old female, returning for follow-up for DM2, dx in 2010, non-insulin -dependent, controlled, with complications (TIA, DR, CKD, peripheral neuropathy, h/o gastroparesis). Pt. previously saw Dr. Washington Hacker, last visit 4 months ago.  Interim history: No increased urination, blurry vision, nausea, chest pain.   She developed COPD after having had COVID-19 infection.  At today's visit, she does not feel well: cough (Tessalon Perles, Mucinex, Delsym - not helping), congestion and a raspy voice.  She will see her PCP tomorrow.  Reviewed HbA1c: Lab Results  Component Value Date   HGBA1C 7.9 (A) 10/23/2023   HGBA1C 6.4 (A) 06/21/2023   HGBA1C 6.8 (A) 02/14/2023   HGBA1C 6.3 (H) 12/14/2022   HGBA1C 6.0 (H) 06/25/2022   HGBA1C 5.8 (A) 01/16/2022   HGBA1C 6.1 (A) 09/21/2021   HGBA1C 6.0 (A) 03/16/2021   HGBA1C 6.2 (A) 09/20/2020   HGBA1C 5.6 05/05/2020   Pt is on a regimen of: - Metformin  500 mg daily - Ozempic  1 mg weekly >>  Trulicity  3 mg weekly - changed by rheumatology 2/2 signif. Weight loss (lowest weight 118 lbs) >> restarted Ozempic  0.5 >> 1 mg weekly - ran out >1 mo ago She had vaginitis from [employer]. She was on multiple insulin  injections 2011-2021 but had problems forgetting them - per Dr. Jacqualyn Mates note.  Pt was checking  her sugars 2x a day: - am:  90s-112 >> 120-130 () >> 110-117 >> 130-170 - 2h after b'fast: n/c - before lunch: n/c - 2h after lunch: n/c >> 200s - before dinner: n/c - 2h after dinner: 140-150s, 200 >> 112-117 >> n/c >> 200s - bedtime: n/c - nighttime: n/c Lowest sugar was Lo x1 >> 100 >> 130; she has hypoglycemia awareness at 60s.  Highest sugar was <200 >> 173 >> 200s  Glucometer: Accu-Chek  - + CKD - seeing nephrology, last BUN/creatinine:   Lab Results  Component Value Date   BUN 18 05/15/2023   BUN 24 (H) 12/14/2022   CREATININE  1.14 (H) 05/15/2023   CREATININE 1.24 (H) 12/14/2022   ACR:   Lab Results  Component Value Date   MICRALBCREAT 1.5 06/21/2023   MICRALBCREAT 1.7 02/16/2016  On Cozaar  100 mg daily.  -+ HL; last set of lipids:  Lab Results  Component Value Date   CHOL 139 06/25/2022   HDL 36 (L) 06/25/2022   LDLCALC 73 06/25/2022   TRIG 150 (H) 06/25/2022   CHOLHDL 3.9 06/25/2022  On pravastatin  40 mg daily.  - last eye exam was in 2024. 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 She sees a retinal specialist - on IO injections.   - + numbness and tingling in her feet. She sees neurology >> on Gabapentin  - still symptomatic >> tapered it off >> Cymbalta  >> helps.  Last foot exam 06/21/2023.  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 She had total hip replacement on 10/26/2022 after a femoral fracture. She also had right shoulder arthroscopy 12/18/2022. She was in the emergency room with acute cystitis with hematuria 05/15/2023.  ROS: + see HPI  Past Medical History:  Diagnosis Date   Anxiety    Arthritis    psoriatic arithritis, DDD    Asthma    Chronic kidney disease    CKD3a   Depression  Diabetes mellitus    type 2   Gastritis    Gastroparesis    GERD (gastroesophageal reflux disease)    Hernia    Hyperlipemia    Hyperlipemia    Hypertension    Liver cirrhosis (HCC)    Nausea and vomiting 01/06/2013   Neuropathy    Bil feet re: to Diabetes   Obesity    Osteoporosis    Plantar fasciitis    Stricture and stenosis of esophagus    Stroke (HCC)    TIA (transient ischemic attack)    8-10 yrs ago, no problems since   Tuberculosis    tested positive 2011, no symptoms, was on medicine for 6 months   Past Surgical History:  Procedure Laterality Date   BREAST SURGERY Bilateral    biopsies both breast   CARDIAC CATHETERIZATION  08/30/2010    CESAREAN SECTION     x 2    CHOLECYSTECTOMY     COLONOSCOPY     FOOT SURGERY Left    spur removal   HERNIA REPAIR     umbilical hernia repaired at the same time as cholecystectomy   IR FLUORO GUIDE CV LINE RIGHT  04/12/2021   IR US  GUIDE VASC ACCESS RIGHT  04/12/2021   JOINT REPLACEMENT     bilat knee    KNEE SURGERY Bilateral    x 2 joint knee replacements   KYPHOPLASTY N/A 03/28/2022   Procedure: LUMBAR ONE KYPHOPLASTY;  Surgeon: Virl Grimes, MD;  Location: [facility] OR;  Service: Orthopedics;  Laterality: N/A;  LUMBAR ONE KYPHOPLASTY   SHOULDER ARTHROSCOPY Right 12/18/2022   Procedure: RIGHT SHOULDER ARTHROSCOPY, ACROMIOPLASTY AND DEBRIDEMENT;  Surgeon: Dayne Even, MD;  Location: [facility];  Service: Orthopedics;  Laterality: Right;   TOTAL HIP ARTHROPLASTY Left 10/26/2022   Procedure: TOTAL HIP ARTHROPLASTY ANTERIOR APPROACH;  Surgeon: Neil Balls, MD;  Location: [facility];  Service: Orthopedics;  Laterality: Left;   TOTAL KNEE REVISION Left 10/03/2021   Procedure: LEFT TOTAL KNEE REVISION POLY SWAP WITH IRRIGATION AND DEBRIDEMENT;  Surgeon: Dayne Even, MD;  Location: [facility];  Service: Orthopedics;  Laterality: Left;   UPPER GASTROINTESTINAL ENDOSCOPY     WISDOM TOOTH EXTRACTION     Social History   Socioeconomic History   Marital status: Divorced    Spouse name: Not on file   Number of children: 2   Years of education: Not on file   Highest education level: Not on file  Occupational History   Occupation: Disabled   Tobacco Use   Smoking status: Never   Smokeless tobacco: Never  Vaping Use   Vaping status: Never Used  Substance and Sexual Activity   Alcohol use: Not Currently    Comment: socially   Drug use: No   Sexual activity: Not Currently    Birth control/protection: Post-menopausal  Other Topics Concern   Not on file  Social History Narrative   Sodas daily    Social Drivers of Health   Financial Resource Strain: Not on file  Food Insecurity: Low Risk   (11/27/2023)   Received from [facility]   Hunger Vital Sign    Worried About Running Out of Food in the Last Year: Never true    Ran Out of Food in the Last Year: Never true  Transportation Needs: No Transportation Needs (11/27/2023)   Received from [facility]    In the past 12 months, has lack of reliable transportation kept you from medical appointments, meetings, work or from  getting things needed for daily living? : No  Physical Activity: Not on file  Stress: Not on file  Social Connections: Unknown (02/23/2022)   Received from [facility]   Social Network    Social Network: Not on file  Intimate Partner Violence: Not At Risk (10/26/2022)   Humiliation, Afraid, Rape, and Kick questionnaire    Fear of Current or Ex-Partner: No    Emotionally Abused: No    Physically Abused: No    Sexually Abused: No   Current Outpatient Medications on File Prior to Visit  Medication Sig Dispense Refill   ACCU-CHEK GUIDE TEST test strip 1 STRIP BY IN VITRO ROUTE 2 (TWO) TIMES DAILY. ACCU CHEK GUIDE TEST STRIPS 100 strip 0   Accu-Chek Softclix Lancets lancets Use as instructed to check blood sugar 1-2 times daily Dx E11.9 200 each 3   albuterol  (VENTOLIN  HFA) 108 (90 Base) MCG/ACT inhaler Inhale 2 puffs into the lungs every 4 (four) hours as needed for wheezing or shortness of breath (or coughing).     amLODipine  (NORVASC ) 2.5 MG tablet Take 2.5 mg by mouth daily.     aspirin  EC 81 MG tablet Take 81 mg by mouth daily. Swallow whole.     Blood Glucose Monitoring Suppl (ACCU-CHEK GUIDE ME) w/Device KIT USE AS INSTRUCTED TO CHECK BLOOD SUGAR 2 TIMES DAILY DX E11.9 1 kit 0   busPIRone  (BUSPAR ) 5 MG tablet Take 10 mg by mouth at bedtime.     Calcium  Carb-Cholecalciferol (CALCIUM  CARBONATE-VITAMIN D3 PO) Take 1 tablet by mouth daily.     cephALEXin  (KEFLEX ) 500 MG capsule Take 1 capsule (500 mg total) by mouth 3 (three) times daily. 15 capsule 0   COSENTYX SENSOREADY,  300 MG, 150 MG/ML SOAJ Inject 300 mg into the skin every 30 (thirty) days.     docusate sodium  (COLACE) 100 MG capsule Take 1 capsule (100 mg total) by mouth 2 (two) times daily. (Patient taking differently: Take 100 mg by mouth daily as needed for mild constipation.) 10 capsule 0   DULoxetine  (CYMBALTA ) 60 MG capsule Take 60 mg by mouth daily.     FLUoxetine  (PROZAC ) 20 MG capsule Take 20 mg by mouth daily.     losartan  (COZAAR ) 100 MG tablet TAKE 1 TABLET BY MOUTH EVERY DAY IN THE MORNING 90 tablet 1   metFORMIN  (GLUCOPHAGE -XR) 500 MG 24 hr tablet Take 1 tablet (500 mg total) by mouth at bedtime. 90 tablet 3   NEXIUM  40 MG capsule Take 40 mg by mouth 2 (two) times daily before a meal.     pravastatin  (PRAVACHOL ) 80 MG tablet Take 80 mg by mouth daily.     primidone  (MYSOLINE ) 250 MG tablet Take 250 mg by mouth at bedtime.     QVAR REDIHALER 80 MCG/ACT inhaler Inhale 1 puff into the lungs in the morning and at bedtime.     Semaglutide , 1 MG/DOSE, (OZEMPIC , 1 MG/DOSE,) 4 MG/3ML SOPN Inject 1 mg into the skin once a week. 9 mL 3   No current facility-administered medications on file prior to visit.   Allergies  Allergen Reactions   Lisinopril Cough   Family History  Problem Relation Age of Onset   Diabetes Other        maternal aunts and uncles   Diabetes Father    Liver disease Father    Emphysema Father        smoked   Diabetes Sister    Diabetes Brother    Sarcoidosis  Brother    Emphysema Mother        smoked   Asthma Mother    Asthma Brother    Colon cancer Neg Hx    Esophageal cancer Neg Hx    Stomach cancer Neg Hx    Rectal cancer Neg Hx    PE: BP 138/86   Pulse 98   Ht 5' (1.524 m)   Wt 150 lb 3.2 oz (68.1 kg)   LMP  (LMP Unknown)   SpO2 96%   BMI 29.33 kg/m  Wt Readings from Last 3 Encounters:  02/20/24 150 lb 3.2 oz (68.1 kg)  10/23/23 151 lb 9.6 oz (68.8 kg)  06/21/23 148 lb 3.2 oz (67.2 kg)   Constitutional: overweight, in NAD Eyes:  EOMI, no  exophthalmos ENT: no neck masses, no cervical lymphadenopathy Cardiovascular: tachycardia, RR, No MRG Respiratory: + Wheezes throughout bilateral lung bases Musculoskeletal: no deformities Skin:no rashes Neurological: no tremor with outstretched hands  ASSESSMENT: 1. DM2, non-insulin -dependent, controlled, with complications - DR - H/o TIA - CKD-sees nephrology - PN - H/o Gastroparesis - resolved  2. HL  3.  Central obesity  PLAN:  1. Patient with longstanding, uncontrolled, type 2 diabetes, on oral antidiabetic regimen with low-dose metformin  and weekly GLP-1 receptor agonist, changed from Trulicity  to Ozempic  due to weight gain and also for better blood sugar control.  At last visit, was not checking blood sugars after her meter broke and I called in an Accu-Chek guide machine along with supplies.  HbA1c was higher, though, at 7.9% so I advised her to increase the Ozempic  dose. -At today's visit, she has a URI  -she is using over-the-counter remedies, but her cough is not getting better.  No fever.  She is seeing her PCP tomorrow.   -Sugars have been higher lately, possibly due to her URI but also due to the fact that she was off Ozempic  for a month.  She tells me that she was advised by the pharmacist that I did not approve refills.  At last visit, 4 months ago, I sent a prescription for 3 months of Ozempic  with 3 refills to her pharmacy, so I approved this until 10/2024.  At today's visit, I advised her that if this happens again to get in touch with me so we can clarify with the pharmacy.  Also, I sent another prescription for Ozempic  to the pharmacy and also gave her a written prescription.  We also discussed about possibly increasing metformin  to twice a day to hopefully improve her blood sugars more. - I suggested to:  Patient Instructions  Please increase: - Metformin  ER 500 mg 2x a day  Please restart: - Ozempic  1 mg weekly  Check blood sugars, rotating check  times.  Please return in 3-4 months.  - we checked her HbA1c: 8.2% (higher) - advised to check sugars at different times of the day - 1x a day, rotating check times - advised for yearly eye exams >> she is UTD - return to clinic in 3-4 months  2. HL - Latest lipid panel was reviewed from 09/2023: HDL slightly low, triglycerides elevated, LDL above our target of less than 55 due to cardiovascular disease:  -She is on atorvastatin  40 mg daily.  No side effects.  3.  Central obesity -She lost a significant amount of weight on Ozempic  before, down to 118 pounds.  We did discuss about switching from Ozempic  to Trulicity  but she gained 29 pounds back after doing so.  Her  BMI is in the overweight range usually, but she has central obesity so we started back on Ozempic .  At last visit, I advised her to increase the dose to 1 mg weekly. - She gained 3 pounds before last visit-central disposition - At today's visit, weight is approximately stable  Emilie Harden, MD PhD [facility]

## 2024-02-20 NOTE — Patient Instructions (Addendum)
 Please increase: - Metformin  ER 500 mg 2x a day  Please restart: - Ozempic  1 mg weekly  Check blood sugars, rotating check times.  Please return in 3-4 months.

## 2024-02-20 NOTE — Addendum Note (Signed)
 Addended by: Vernon Goodpasture on: 02/20/2024 01:00 PM   Modules accepted: Orders

## 2024-02-29 ENCOUNTER — Encounter (HOSPITAL_COMMUNITY): Payer: Self-pay | Admitting: Emergency Medicine

## 2024-02-29 ENCOUNTER — Emergency Department (HOSPITAL_COMMUNITY)

## 2024-02-29 ENCOUNTER — Other Ambulatory Visit: Payer: Self-pay

## 2024-02-29 ENCOUNTER — Emergency Department (HOSPITAL_COMMUNITY)
Admission: EM | Admit: 2024-02-29 | Discharge: 2024-02-29 | Disposition: A | Discharge status: Home / Self Care | Attending: Emergency Medicine | Admitting: Emergency Medicine

## 2024-02-29 DIAGNOSIS — Z7982 Long term (current) use of aspirin: Secondary | ICD-10-CM | POA: Insufficient documentation

## 2024-02-29 DIAGNOSIS — W1809XA Striking against other object with subsequent fall, initial encounter: Secondary | ICD-10-CM | POA: Insufficient documentation

## 2024-02-29 DIAGNOSIS — M25512 Pain in left shoulder: Secondary | ICD-10-CM | POA: Insufficient documentation

## 2024-02-29 DIAGNOSIS — W19XXXA Unspecified fall, initial encounter: Secondary | ICD-10-CM

## 2024-02-29 DIAGNOSIS — S42292A Other displaced fracture of upper end of left humerus, initial encounter for closed fracture: Secondary | ICD-10-CM | POA: Diagnosis not present

## 2024-02-29 DIAGNOSIS — S4992XA Unspecified injury of left shoulder and upper arm, initial encounter: Secondary | ICD-10-CM | POA: Diagnosis present

## 2024-02-29 MED ORDER — HYDROMORPHONE HCL 1 MG/ML IJ SOLN
1.0000 mg | Freq: Once | INTRAMUSCULAR | Status: AC
  Administered 2024-02-29: 1 mg via INTRAVENOUS
  Filled 2024-02-29: qty 1

## 2024-02-29 MED ORDER — OXYCODONE-ACETAMINOPHEN 5-325 MG PO TABS: 1.0000 | ORAL_TABLET | Freq: Four times a day (QID) | ORAL | 0 refills | Status: DC | PRN

## 2024-02-29 NOTE — Progress Notes (Signed)
 Orthopedic Tech Progress Note Patient Details:  Carrie [name] Oct 13, 1956 [id number]  Ortho Devices Type of Ortho Device: Coapt, Shoulder immobilizer Ortho Device/Splint Location: LUE Ortho Device/Splint Interventions: Ordered, Application, Adjustment   Post Interventions Patient Tolerated: Well Instructions Provided: Care of device  [name] 02/29/2024, 3:26 PM

## 2024-02-29 NOTE — ED Triage Notes (Signed)
 Patient bib [facility] for left shoulder pain after a mechanical fall

## 2024-02-29 NOTE — ED Notes (Signed)
 Patient educated on not driving while taking pain meds

## 2024-02-29 NOTE — ED Provider Notes (Signed)
 [place] EMERGENCY DEPARTMENT AT [facility] Provider Note   CSN: [id number] Arrival date & time: 02/29/24  1343     History  Chief Complaint  Patient presents with   Fall   Shoulder Injury    Carrie [name] is a 68 y.o. female.  HPI Patient presents after mechanical fall with pain in left shoulder.  She arrives via EMS.  History is obtained by the patient, and EMS.  She notes that she got her feet tangled in a dog leash.  She fell to the ground, striking her left shoulder, since that time has had severe pain in that area.  No pain anywhere else, though she does have pain in the left shoulder with head motion.  No weakness anywhere, no loss of sensation.  EMS reports patient was hemodynamically unremarkable, received 150 mg fentanyl  with some decrease in her pain.    Home Medications Prior to Admission medications   Medication Sig Start Date End Date Taking? Authorizing Provider  oxyCODONE -acetaminophen  (PERCOCET/ROXICET) 5-325 MG tablet Take 1 tablet by mouth every 6 (six) hours as needed for severe pain (pain score 7-10). 02/29/24  Yes Dorenda Gandy, MD  ACCU-CHEK GUIDE TEST test strip 1 STRIP BY IN VITRO ROUTE 2 (TWO) TIMES DAILY. ACCU CHEK GUIDE TEST STRIPS 01/01/24   Emilie Harden, MD  Accu-Chek Softclix Lancets lancets Use as instructed to check blood sugar 1-2 times daily Dx E11.9 10/23/23   Emilie Harden, MD  albuterol  (VENTOLIN  HFA) 108 (90 Base) MCG/ACT inhaler Inhale 2 puffs into the lungs every 4 (four) hours as needed for wheezing or shortness of breath (or coughing).    [provider]  amLODipine  (NORVASC ) 2.5 MG tablet Take 2.5 mg by mouth daily. 09/03/22   [provider]  aspirin  EC 81 MG tablet Take 81 mg by mouth daily. Swallow whole.    [provider]  Blood Glucose Monitoring Suppl (ACCU-CHEK GUIDE ME) w/Device KIT USE AS INSTRUCTED TO CHECK BLOOD SUGAR 2 TIMES DAILY DX E11.9 12/25/23   Emilie Harden, MD   busPIRone  (BUSPAR ) 5 MG tablet Take 10 mg by mouth at bedtime.    [provider]  Calcium  Carb-Cholecalciferol (CALCIUM  CARBONATE-VITAMIN D3 PO) Take 1 tablet by mouth daily.    [provider]  cephALEXin  (KEFLEX ) 500 MG capsule Take 1 capsule (500 mg total) by mouth 3 (three) times daily. 05/15/23   Dalene Duck, MD  COSENTYX SENSOREADY, 300 MG, 150 MG/ML SOAJ Inject 300 mg into the skin every 30 (thirty) days. 02/27/22   [provider]  docusate sodium  (COLACE) 100 MG capsule Take 1 capsule (100 mg total) by mouth 2 (two) times daily. Patient taking differently: Take 100 mg by mouth daily as needed for mild constipation. 04/15/22   Lesa Rape, MD  DULoxetine  (CYMBALTA ) 60 MG capsule Take 60 mg by mouth daily. 09/28/22   [provider]  FLUoxetine  (PROZAC ) 20 MG capsule Take 20 mg by mouth daily.    [provider]  losartan  (COZAAR ) 100 MG tablet TAKE 1 TABLET BY MOUTH EVERY DAY IN THE MORNING 02/03/23   Emilie Harden, MD  metFORMIN  (GLUCOPHAGE -XR) 500 MG 24 hr tablet Take 1 tablet (500 mg total) by mouth at bedtime. 08/15/23   Motwani, Komal, MD  NEXIUM  40 MG capsule Take 40 mg by mouth 2 (two) times daily before a meal.    [provider]  pravastatin  (PRAVACHOL ) 80 MG tablet Take 80 mg by mouth daily.    [provider]  primidone  (MYSOLINE ) 250 MG tablet Take 250 mg by mouth at bedtime. 12/03/22   [provider]  QVAR REDIHALER 80 MCG/ACT inhaler Inhale 1 puff into the lungs in the morning and at bedtime.    [provider]  Semaglutide , 1 MG/DOSE, (OZEMPIC , 1 MG/DOSE,) 4 MG/3ML SOPN Inject 1 mg into the skin once a week. 02/20/24   Emilie Harden, MD      Allergies    Lisinopril    Review of Systems   Review of Systems  Physical Exam Updated Vital Signs BP (!) 115/95   Pulse 93   Temp 98.1 F (36.7 C) (Oral)   Resp 18   Ht 5' (1.524 m)   Wt 68 kg   LMP  (LMP Unknown)   SpO2 100%   BMI  29.29 kg/m  Physical Exam Vitals and nursing note reviewed.  Constitutional:      General: She is not in acute distress.    Appearance: She is well-developed.  HENT:     Head: Normocephalic and atraumatic.  Eyes:     Conjunctiva/sclera: Conjunctivae normal.  Neck:   Cardiovascular:     Rate and Rhythm: Normal rate and regular rhythm.     Pulses: Normal pulses.  Pulmonary:     Effort: Pulmonary effort is normal. No respiratory distress.     Breath sounds: Normal breath sounds. No stridor.  Abdominal:     General: There is no distension.  Musculoskeletal:       Arms:     Cervical back: Normal range of motion and neck supple. No spinous process tenderness or muscular tenderness.  Skin:    General: Skin is warm and dry.  Neurological:     Mental Status: She is alert and oriented to person, place, and time.     Cranial Nerves: No cranial nerve deficit.  Psychiatric:        Mood and Affect: Mood normal.     ED Results / Procedures / Treatments   Labs (all labs ordered are listed, but only abnormal results are displayed) Labs Reviewed - No data to display  EKG None  Radiology DG Shoulder Left Result Date: 02/29/2024 CLINICAL DATA:  fall, acute shoulder pain EXAM: LEFT SHOULDER - 2+ VIEW COMPARISON:  05/15/2023 FINDINGS: There is an acute displaced proximal left humerus surgical neck fracture. No associated shoulder joint malalignment. AC joint degenerative change noted without separation. Included left chest unremarkable. IMPRESSION: Acute displaced proximal left humerus surgical neck fracture. Electronically Signed   By: Melven Stable.  Shick M.D.   On: 02/29/2024 14:41    Procedures Procedures    Medications Ordered in ED Medications  HYDROmorphone  (DILAUDID ) injection 1 mg (has no administration in time range)  HYDROmorphone  (DILAUDID ) injection 1 mg (1 mg Intravenous Given 02/29/24 1410)  HYDROmorphone  (DILAUDID ) injection 1 mg (1 mg Intravenous Given 02/29/24 1538)    ED  Course/ Medical Decision Making/ A&P                                 Medical Decision Making Adult female presents via EMS after fall with shoulder pain, inability to move it.  Concern for fracture versus dislocation.  Elbow unremarkable, neck unremarkable, patient is neurovascularly intact. She has received 250 mcg of fentanyl , will receive additional Dilaudid , x-rays. Pulse ox 98% room air normal  Amount and/or Complexity of Data Reviewed Independent Historian: EMS Radiology: ordered and independent interpretation performed. Decision-making  details documented in ED Course.  Risk Prescription drug management. Decision regarding hospitalization.  Update: I reviewed the x-ray, discussed with the patient, she is amenable to splinting, additional analgesia provided.  4:30 PM Patient has had successful splinting with the assistance for Ortho tech.  I discussed her case with Dr. Cherl Corner our orthopedist on-call.  Patient will follow-up with her own orthopedist or with Dr. Hiram Lukes for ongoing management of left shoulder proximal humerus fracture.  Patient has remained hemodynamically remarkable, has had no new complaints, is distally neurovascularly unremarkable.        Final Clinical Impression(s) / ED Diagnoses Final diagnoses:  Fall, initial encounter  Other closed displaced fracture of proximal end of left humerus, initial encounter    Rx / DC Orders ED Discharge Orders          Ordered    oxyCODONE -acetaminophen  (PERCOCET/ROXICET) 5-325 MG tablet  Every 6 hours PRN        02/29/24 1629              Dorenda Gandy, MD 02/29/24 1630

## 2024-02-29 NOTE — Discharge Instructions (Addendum)
 Please be sure to follow-up with either your orthopedist or Dr. Hiram Lukes.  Call on Monday for an appointment this week.  Return here for concerning changes in your condition.

## 2024-03-02 ENCOUNTER — Ambulatory Visit: Payer: 59

## 2024-03-02 MED ORDER — DENOSUMAB 60 MG/ML ~~LOC~~ SOSY: 60.0000 mg | PREFILLED_SYRINGE | Freq: Once | SUBCUTANEOUS | Status: AC

## 2024-03-03 ENCOUNTER — Other Ambulatory Visit: Payer: Self-pay | Admitting: Orthopedic Surgery

## 2024-03-03 ENCOUNTER — Ambulatory Visit
Admission: RE | Admit: 2024-03-03 | Discharge: 2024-03-03 | Disposition: A | Discharge status: Home / Self Care | Source: Ambulatory Visit | Attending: Orthopedic Surgery | Admitting: Orthopedic Surgery

## 2024-03-03 DIAGNOSIS — M25512 Pain in left shoulder: Secondary | ICD-10-CM

## 2024-03-04 ENCOUNTER — Other Ambulatory Visit: Payer: Self-pay | Admitting: Orthopedic Surgery

## 2024-03-05 ENCOUNTER — Other Ambulatory Visit: Payer: Self-pay

## 2024-03-05 ENCOUNTER — Encounter (HOSPITAL_COMMUNITY): Payer: Self-pay

## 2024-03-05 ENCOUNTER — Ambulatory Visit (HOSPITAL_COMMUNITY)
Admission: RE | Admit: 2024-03-05 | Discharge: 2024-03-05 | Disposition: A | Discharge status: Home / Self Care | Source: Ambulatory Visit | Attending: Orthopedic Surgery | Admitting: Orthopedic Surgery

## 2024-03-05 ENCOUNTER — Encounter (HOSPITAL_COMMUNITY)
Admission: RE | Admit: 2024-03-05 | Discharge: 2024-03-05 | Disposition: A | Discharge status: Home / Self Care | Source: Ambulatory Visit | Attending: Orthopedic Surgery | Admitting: Orthopedic Surgery

## 2024-03-05 ENCOUNTER — Observation Stay (HOSPITAL_COMMUNITY)
Admit: 2024-03-05 | Discharge: 2024-03-07 | Disposition: A | Discharge status: Home / Self Care | Source: Ambulatory Visit | Attending: Internal Medicine | Admitting: Internal Medicine

## 2024-03-05 VITALS — BP 120/60 | HR 86 | Temp 98.5°F | Resp 16 | Ht 60.0 in | Wt 150.0 lb

## 2024-03-05 DIAGNOSIS — Z96642 Presence of left artificial hip joint: Secondary | ICD-10-CM | POA: Diagnosis not present

## 2024-03-05 DIAGNOSIS — S42292A Other displaced fracture of upper end of left humerus, initial encounter for closed fracture: Principal | ICD-10-CM | POA: Insufficient documentation

## 2024-03-05 DIAGNOSIS — N1831 Chronic kidney disease, stage 3a: Secondary | ICD-10-CM | POA: Diagnosis not present

## 2024-03-05 DIAGNOSIS — E119 Type 2 diabetes mellitus without complications: Secondary | ICD-10-CM

## 2024-03-05 DIAGNOSIS — Z01812 Encounter for preprocedural laboratory examination: Secondary | ICD-10-CM | POA: Insufficient documentation

## 2024-03-05 DIAGNOSIS — E1122 Type 2 diabetes mellitus with diabetic chronic kidney disease: Secondary | ICD-10-CM | POA: Insufficient documentation

## 2024-03-05 DIAGNOSIS — Z7982 Long term (current) use of aspirin: Secondary | ICD-10-CM | POA: Insufficient documentation

## 2024-03-05 DIAGNOSIS — K769 Liver disease, unspecified: Secondary | ICD-10-CM | POA: Insufficient documentation

## 2024-03-05 DIAGNOSIS — Z01818 Encounter for other preprocedural examination: Secondary | ICD-10-CM

## 2024-03-05 DIAGNOSIS — Z79899 Other long term (current) drug therapy: Secondary | ICD-10-CM | POA: Diagnosis not present

## 2024-03-05 DIAGNOSIS — Z7984 Long term (current) use of oral hypoglycemic drugs: Secondary | ICD-10-CM | POA: Insufficient documentation

## 2024-03-05 DIAGNOSIS — Z96612 Presence of left artificial shoulder joint: Secondary | ICD-10-CM

## 2024-03-05 DIAGNOSIS — W010XXA Fall on same level from slipping, tripping and stumbling without subsequent striking against object, initial encounter: Secondary | ICD-10-CM | POA: Insufficient documentation

## 2024-03-05 DIAGNOSIS — D649 Anemia, unspecified: Secondary | ICD-10-CM | POA: Diagnosis not present

## 2024-03-05 DIAGNOSIS — J4540 Moderate persistent asthma, uncomplicated: Secondary | ICD-10-CM | POA: Insufficient documentation

## 2024-03-05 DIAGNOSIS — E118 Type 2 diabetes mellitus with unspecified complications: Secondary | ICD-10-CM

## 2024-03-05 DIAGNOSIS — Z96652 Presence of left artificial knee joint: Secondary | ICD-10-CM | POA: Insufficient documentation

## 2024-03-05 DIAGNOSIS — I129 Hypertensive chronic kidney disease with stage 1 through stage 4 chronic kidney disease, or unspecified chronic kidney disease: Secondary | ICD-10-CM | POA: Diagnosis not present

## 2024-03-05 DIAGNOSIS — Z8673 Personal history of transient ischemic attack (TIA), and cerebral infarction without residual deficits: Secondary | ICD-10-CM | POA: Diagnosis not present

## 2024-03-05 DIAGNOSIS — E1165 Type 2 diabetes mellitus with hyperglycemia: Secondary | ICD-10-CM | POA: Diagnosis not present

## 2024-03-05 DIAGNOSIS — I10 Essential (primary) hypertension: Secondary | ICD-10-CM | POA: Diagnosis present

## 2024-03-05 HISTORY — DX: Family history of other specified conditions: Z84.89

## 2024-03-05 HISTORY — DX: Anemia, unspecified: D64.9

## 2024-03-05 LAB — CBC
HCT: 22.4 % — ABNORMAL LOW (ref 36.0–46.0)
Hemoglobin: 6.5 g/dL — CL (ref 12.0–15.0)
MCH: 20.6 pg — ABNORMAL LOW (ref 26.0–34.0)
MCHC: 29 g/dL — ABNORMAL LOW (ref 30.0–36.0)
MCV: 71.1 fL — ABNORMAL LOW (ref 80.0–100.0)
Platelets: 470 10*3/uL — ABNORMAL HIGH (ref 150–400)
RBC: 3.15 MIL/uL — ABNORMAL LOW (ref 3.87–5.11)
RDW: 19.7 % — ABNORMAL HIGH (ref 11.5–15.5)
WBC: 8 10*3/uL (ref 4.0–10.5)
nRBC: 0.4 % — ABNORMAL HIGH (ref 0.0–0.2)

## 2024-03-05 LAB — FERRITIN: Ferritin: 28 ng/mL (ref 11–307)

## 2024-03-05 LAB — COMPREHENSIVE METABOLIC PANEL WITH GFR
ALT: 8 U/L (ref 0–44)
AST: 12 U/L — ABNORMAL LOW (ref 15–41)
Albumin: 2.9 g/dL — ABNORMAL LOW (ref 3.5–5.0)
Alkaline Phosphatase: 68 U/L (ref 38–126)
Anion gap: 8 (ref 5–15)
BUN: 26 mg/dL — ABNORMAL HIGH (ref 8–23)
CO2: 22 mmol/L (ref 22–32)
Calcium: 8.8 mg/dL — ABNORMAL LOW (ref 8.9–10.3)
Chloride: 105 mmol/L (ref 98–111)
Creatinine, Ser: 1.31 mg/dL — ABNORMAL HIGH (ref 0.44–1.00)
GFR, Estimated: 45 mL/min — ABNORMAL LOW (ref >60)
Glucose, Bld: 163 mg/dL — ABNORMAL HIGH (ref 70–99)
Potassium: 5 mmol/L (ref 3.5–5.1)
Sodium: 135 mmol/L (ref 135–145)
Total Bilirubin: 0.4 mg/dL (ref 0.0–1.2)
Total Protein: 7.9 g/dL (ref 6.5–8.1)

## 2024-03-05 LAB — GLUCOSE, CAPILLARY
Glucose-Capillary: 159 mg/dL — ABNORMAL HIGH (ref 70–99)
Glucose-Capillary: 186 mg/dL — ABNORMAL HIGH (ref 70–99)

## 2024-03-05 LAB — IRON AND TIBC
Iron: 19 ug/dL — ABNORMAL LOW (ref 28–170)
Saturation Ratios: 5 % — ABNORMAL LOW (ref 10.4–31.8)
TIBC: 351 ug/dL (ref 250–450)
UIBC: 332 ug/dL

## 2024-03-05 LAB — SURGICAL PCR SCREEN
MRSA, PCR: NEGATIVE
Staphylococcus aureus: POSITIVE — AB

## 2024-03-05 LAB — FOLATE: Folate: 6 ng/mL (ref >5.9)

## 2024-03-05 LAB — VITAMIN B12: Vitamin B-12: 501 pg/mL (ref 180–914)

## 2024-03-05 LAB — PREPARE RBC (CROSSMATCH)

## 2024-03-05 MED ORDER — MIRABEGRON ER 25 MG PO TB24
25.0000 mg | ORAL_TABLET | Freq: Every day | ORAL | Status: DC
  Administered 2024-03-07: 25 mg via ORAL
  Filled 2024-03-05: qty 1

## 2024-03-05 MED ORDER — ACETAMINOPHEN 325 MG PO TABS
650.0000 mg | ORAL_TABLET | Freq: Four times a day (QID) | ORAL | Status: DC | PRN
  Administered 2024-03-05 – 2024-03-06 (×2): 650 mg via ORAL
  Filled 2024-03-05 (×2): qty 2

## 2024-03-05 MED ORDER — LOSARTAN POTASSIUM 50 MG PO TABS: 100.0000 mg | ORAL_TABLET | Freq: Every day | ORAL | Status: DC

## 2024-03-05 MED ORDER — OXYCODONE HCL 5 MG PO TABS
5.0000 mg | ORAL_TABLET | Freq: Four times a day (QID) | ORAL | Status: DC | PRN
  Administered 2024-03-06: 5 mg via ORAL
  Filled 2024-03-05: qty 1

## 2024-03-05 MED ORDER — PRAVASTATIN SODIUM 40 MG PO TABS
80.0000 mg | ORAL_TABLET | Freq: Every day | ORAL | Status: DC
  Administered 2024-03-07: 80 mg via ORAL
  Filled 2024-03-05: qty 2

## 2024-03-05 MED ORDER — PANTOPRAZOLE SODIUM 40 MG PO TBEC
40.0000 mg | DELAYED_RELEASE_TABLET | Freq: Every day | ORAL | Status: DC
  Administered 2024-03-07: 40 mg via ORAL
  Filled 2024-03-05: qty 1

## 2024-03-05 MED ORDER — BUDESONIDE 0.25 MG/2ML IN SUSP
0.2500 mg | Freq: Two times a day (BID) | RESPIRATORY_TRACT | Status: DC
  Administered 2024-03-06 – 2024-03-07 (×2): 0.25 mg via RESPIRATORY_TRACT
  Filled 2024-03-05 (×3): qty 2

## 2024-03-05 MED ORDER — BUSPIRONE HCL 10 MG PO TABS
10.0000 mg | ORAL_TABLET | Freq: Every day | ORAL | Status: DC
  Administered 2024-03-05 – 2024-03-06 (×2): 10 mg via ORAL
  Filled 2024-03-05 (×2): qty 1

## 2024-03-05 MED ORDER — ORAL CARE MOUTH RINSE: 15.0000 mL | OROMUCOSAL | Status: DC | PRN

## 2024-03-05 MED ORDER — ALBUTEROL SULFATE (2.5 MG/3ML) 0.083% IN NEBU
2.5000 mg | INHALATION_SOLUTION | Freq: Four times a day (QID) | RESPIRATORY_TRACT | Status: DC | PRN
  Administered 2024-03-06: 2.5 mg via RESPIRATORY_TRACT
  Filled 2024-03-05: qty 3

## 2024-03-05 MED ORDER — ENOXAPARIN SODIUM 30 MG/0.3ML IJ SOSY: 30.0000 mg | PREFILLED_SYRINGE | INTRAMUSCULAR | Status: DC

## 2024-03-05 MED ORDER — ACETAMINOPHEN 650 MG RE SUPP: 650.0000 mg | Freq: Four times a day (QID) | RECTAL | Status: DC | PRN

## 2024-03-05 MED ORDER — MUPIROCIN 2 % EX OINT
1.0000 | TOPICAL_OINTMENT | Freq: Two times a day (BID) | CUTANEOUS | Status: DC
  Administered 2024-03-05 – 2024-03-07 (×3): 1 via NASAL
  Filled 2024-03-05: qty 22

## 2024-03-05 MED ORDER — SODIUM CHLORIDE 0.9% IV SOLUTION: Freq: Once | INTRAVENOUS | Status: AC

## 2024-03-05 MED ORDER — DULOXETINE HCL 30 MG PO CPEP
60.0000 mg | ORAL_CAPSULE | Freq: Every day | ORAL | Status: DC
  Administered 2024-03-07: 60 mg via ORAL
  Filled 2024-03-05: qty 2

## 2024-03-05 MED ORDER — CHLORHEXIDINE GLUCONATE CLOTH 2 % EX PADS
6.0000 | MEDICATED_PAD | Freq: Every day | CUTANEOUS | Status: DC
  Administered 2024-03-05 – 2024-03-07 (×2): 6 via TOPICAL

## 2024-03-05 MED ORDER — INSULIN ASPART 100 UNIT/ML IJ SOLN
0.0000 [IU] | Freq: Three times a day (TID) | INTRAMUSCULAR | Status: DC
  Administered 2024-03-06: 3 [IU] via SUBCUTANEOUS
  Administered 2024-03-06: 11 [IU] via SUBCUTANEOUS
  Administered 2024-03-07 (×2): 3 [IU] via SUBCUTANEOUS
  Filled 2024-03-05: qty 1

## 2024-03-05 MED ORDER — ALBUTEROL SULFATE HFA 108 (90 BASE) MCG/ACT IN AERS: 2.0000 | INHALATION_SPRAY | Freq: Four times a day (QID) | RESPIRATORY_TRACT | Status: DC | PRN

## 2024-03-05 MED ORDER — DILTIAZEM HCL ER COATED BEADS 240 MG PO CP24
240.0000 mg | ORAL_CAPSULE | Freq: Every day | ORAL | Status: DC
  Administered 2024-03-06 – 2024-03-07 (×2): 240 mg via ORAL
  Filled 2024-03-05 (×2): qty 1

## 2024-03-05 NOTE — Hospital Course (Addendum)
 HPI: Carrie [name] is a 68 y.o. female with medical history significant of anemia of chronic disease, hypertension, type 2 diabetes, history of CVA, stage IIIa CKD, psoriatic arthritis, osteoporosis, moderate persistent asthma, hyperlipidemia, GERD, anxiety and depression presenting to the ED for low hemoglobin.   Patient reports having sustained a mechanical fall this past Saturday after tripping on her dog collar and falling onto her left shoulder.  She is scheduled for left total shoulder arthroplasty tomorrow morning.  She underwent preop testing today and was noted to have a hemoglobin of 6.5 on CBC this afternoon.  She was ultimately directly admitted into the hospital for medical optimization prior to shoulder arthroplasty tomorrow.   Patient reports that she has been feeling generally well aside from pain in her left shoulder.  She denies any hematochezia, melena, hematemesis, hematuria.  She does endorse chronic mild shortness of breath with exertion secondary to her underlying asthma but has not noted any worsening.  She does endorse mildly increased fatigue in her day-to-day activities but this is not something that is new for her.  She otherwise denies any fevers, chills, nausea, vomiting, chest pain, palpitations, cough, abdominal pain, urinary changes.   Preop testing with CBC showing hemoglobin 6.5, MCV 71.1, reactive thrombocytosis.  Preop CMP showing glucose 163, creatinine 1.31 (around her baseline), albumin 2.9.  Initial vital signs on arrival with mild tachycardia and mildly elevated blood pressure.  Significant Events: Admitted 03/05/2024 for acute on chronic microcytic anemia   Admission Labs: WBC 8.0, HgB 6.5, plt 470 Na 135, K 5.0, BUN 26, Scr 1.31, glu 163 AST 12, ALT 8, alk phos 68  Admission Imaging Studies: CXR No active cardiopulmonary disease   Significant Labs:   Significant Imaging Studies:   Antibiotic Therapy: Anti-infectives (From admission, onward)     Start     Dose/Rate Route Frequency Ordered Stop   03/06/24 0645  ceFAZolin  (ANCEF ) IVPB 2g/100 mL premix        2 g 200 mL/hr over 30 Minutes Intravenous On call to O.R. 03/06/24 1914 03/07/24 0559       Procedures:   Consultants:

## 2024-03-05 NOTE — Progress Notes (Signed)
 Received critical hemoglobin of 6.5.  Jessica Ward, PA-C notified and she will notify the surgeon's office.

## 2024-03-05 NOTE — H&P (Signed)
 History and Physical    Patient: Carrie [name] ZOX:[id number] DOB: Dec 28, 1955 DOA: 03/05/2024 DOS: the patient was seen and examined on 03/05/2024 PCP: Carrie Maxim, MD  Patient coming from: Home  Chief Complaint: low hemoglobin  HPI: Carrie [name] is a 68 y.o. female with medical history significant of anemia of chronic disease, hypertension, type 2 diabetes, history of CVA, stage IIIa CKD, psoriatic arthritis, osteoporosis, moderate persistent asthma, hyperlipidemia, GERD, anxiety and depression presenting to the ED for low hemoglobin.  Patient reports having sustained a mechanical fall this past Saturday after tripping on her dog collar and falling onto her left shoulder.  She is scheduled for left total shoulder arthroplasty tomorrow morning.  She underwent preop testing today and was noted to have a hemoglobin of 6.5 on CBC this afternoon.  She was ultimately directly admitted into the hospital for medical optimization prior to shoulder arthroplasty tomorrow.  Patient reports that she has been feeling generally well aside from pain in her left shoulder.  She denies any hematochezia, melena, hematemesis, hematuria.  She does endorse chronic mild shortness of breath with exertion secondary to her underlying asthma but has not noted any worsening.  She does endorse mildly increased fatigue in her day-to-day activities but this is not something that is new for her.  She otherwise denies any fevers, chills, nausea, vomiting, chest pain, palpitations, cough, abdominal pain, urinary changes.  Preop testing with CBC showing hemoglobin 6.5, MCV 71.1, reactive thrombocytosis.  Preop CMP showing glucose 163, creatinine 1.31 (around her baseline), albumin 2.9.  Initial vital signs on arrival with mild tachycardia and mildly elevated blood pressure.  Review of Systems: As mentioned in the history of present illness. All other systems reviewed and are negative. Past Medical History:  Diagnosis  Date   Anemia    Anxiety    Arthritis    psoriatic arithritis, DDD    Asthma    Chronic kidney disease    CKD3a   Depression    Diabetes mellitus    type 2   Family history of adverse reaction to anesthesia    Sister hard to wake up   Gastritis    Gastroparesis    GERD (gastroesophageal reflux disease)    Hernia    Hyperlipemia    Hyperlipemia    Hypertension    Liver cirrhosis (HCC)    Nausea and vomiting 01/06/2013   Neuropathy    Bil feet re: to Diabetes   Obesity    Osteoporosis    Plantar fasciitis    Stricture and stenosis of esophagus    Stroke (HCC)    TIA (transient ischemic attack)    8-10 yrs ago, no problems since   Tuberculosis    tested positive 2011, no symptoms, was on medicine for 6 months   Past Surgical History:  Procedure Laterality Date   BREAST SURGERY Bilateral    biopsies both breast   CARDIAC CATHETERIZATION  08/30/2010   CESAREAN SECTION     x 2    CHOLECYSTECTOMY     COLONOSCOPY     FOOT SURGERY Left    spur removal   HERNIA REPAIR     umbilical hernia repaired at the same time as cholecystectomy   IR FLUORO GUIDE CV LINE RIGHT  04/12/2021   IR US  GUIDE VASC ACCESS RIGHT  04/12/2021   JOINT REPLACEMENT     bilat knee    KNEE SURGERY Bilateral    x 2 joint knee replacements   KYPHOPLASTY  N/A 03/28/2022   Procedure: LUMBAR ONE KYPHOPLASTY;  Surgeon: Virl Grimes, MD;  Location: [facility] OR;  Service: Orthopedics;  Laterality: N/A;  LUMBAR ONE KYPHOPLASTY   SHOULDER ARTHROSCOPY Right 12/18/2022   Procedure: RIGHT SHOULDER ARTHROSCOPY, ACROMIOPLASTY AND DEBRIDEMENT;  Surgeon: Dayne Even, MD;  Location: [facility];  Service: Orthopedics;  Laterality: Right;   TOTAL HIP ARTHROPLASTY Left 10/26/2022   Procedure: TOTAL HIP ARTHROPLASTY ANTERIOR APPROACH;  Surgeon: Neil Balls, MD;  Location: [facility];  Service: Orthopedics;  Laterality: Left;   TOTAL KNEE REVISION Left 10/03/2021   Procedure: LEFT TOTAL KNEE REVISION POLY SWAP WITH IRRIGATION  AND DEBRIDEMENT;  Surgeon: Dayne Even, MD;  Location: [facility];  Service: Orthopedics;  Laterality: Left;   UPPER GASTROINTESTINAL ENDOSCOPY     WISDOM TOOTH EXTRACTION     Social History:  reports that she has never smoked. She has never used smokeless tobacco. She reports that she does not currently use alcohol. She reports that she does not use drugs.  Allergies  Allergen Reactions   Lisinopril Cough    Family History  Problem Relation Age of Onset   Diabetes Other        maternal aunts and uncles   Diabetes Father    Liver disease Father    Emphysema Father        smoked   Diabetes Sister    Diabetes Brother    Sarcoidosis Brother    Emphysema Mother        smoked   Asthma Mother    Asthma Brother    Colon cancer Neg Hx    Esophageal cancer Neg Hx    Stomach cancer Neg Hx    Rectal cancer Neg Hx     Prior to Admission medications   Medication Sig Start Date End Date Taking? Authorizing Provider  acetaminophen  (TYLENOL ) 500 MG tablet Take 1,000 mg by mouth every 6 (six) hours as needed for moderate pain (pain score 4-6).   Yes [provider]  albuterol  (VENTOLIN  HFA) 108 (90 Base) MCG/ACT inhaler Inhale 2 puffs into the lungs every 4 (four) hours as needed for wheezing or shortness of breath (or coughing).   Yes [provider]  amLODipine  (NORVASC ) 2.5 MG tablet Take 2.5 mg by mouth daily. 09/03/22  Yes [provider]  aspirin  EC 81 MG tablet Take 81 mg by mouth daily. Swallow whole.   Yes [provider]  busPIRone  (BUSPAR ) 5 MG tablet Take 10 mg by mouth at bedtime.   Yes [provider]  Calcium  Carb-Cholecalciferol (CALCIUM  CARBONATE-VITAMIN D3 PO) Take 1 tablet by mouth daily.   Yes [provider]  clotrimazole-betamethasone (LOTRISONE) cream Apply 1 Application topically 2 (two) times daily as needed (rash). 02/24/24  Yes [provider]  denosumab  (PROLIA ) 60 MG/ML SOSY injection Inject 60 mg into  the skin every 6 (six) months.   Yes [provider]  diltiazem  (CARDIZEM  CD) 240 MG 24 hr capsule Take 240 mg by mouth daily.   Yes [provider]  DULoxetine  (CYMBALTA ) 60 MG capsule Take 60 mg by mouth daily. 09/28/22  Yes [provider]  ferrous sulfate  325 (65 FE) MG EC tablet Take 325 mg by mouth daily.   Yes [provider]  losartan  (COZAAR ) 100 MG tablet TAKE 1 TABLET BY MOUTH EVERY DAY IN THE MORNING 02/03/23  Yes Emilie Harden, MD  Melatonin 12 MG TABS Take 12 mg by mouth at bedtime as needed (sleep).   Yes [provider]  metFORMIN  (GLUCOPHAGE -XR) 500 MG 24 hr tablet Take 1 tablet (500 mg total) by mouth at bedtime. Patient taking differently: Take 500 mg by mouth 2 (two) times daily. 08/15/23  Yes Motwani, Komal, MD  mirabegron ER (MYRBETRIQ) 25 MG TB24 tablet Take 25 mg by mouth daily.   Yes [provider]  omeprazole (PRILOSEC) 40 MG capsule Take 40 mg by mouth 2 (two) times daily. 02/05/24 08/03/24 Yes [provider]  oxyCODONE -acetaminophen  (PERCOCET/ROXICET) 5-325 MG tablet Take 1 tablet by mouth every 6 (six) hours as needed for severe pain (pain score 7-10). 02/29/24  Yes Dorenda Gandy, MD  pravastatin  (PRAVACHOL ) 80 MG tablet Take 80 mg by mouth daily.   Yes [provider]  primidone  (MYSOLINE ) 250 MG tablet Take 125 mg by mouth 2 (two) times daily. 12/03/22  Yes [provider]  QVAR REDIHALER 80 MCG/ACT inhaler Inhale 1 puff into the lungs 2 (two) times daily.   Yes [provider]  Semaglutide , 1 MG/DOSE, (OZEMPIC , 1 MG/DOSE,) 4 MG/3ML SOPN Inject 1 mg into the skin once a week. 02/20/24  Yes Emilie Harden, MD  STELARA 45 MG/0.5ML injection Inject 45 mg into the skin every 3 (three) months. 07/23/23  Yes [provider]  ACCU-CHEK GUIDE TEST test strip 1 STRIP BY IN VITRO ROUTE 2 (TWO) TIMES DAILY. ACCU CHEK GUIDE TEST STRIPS 01/01/24   Emilie Harden, MD  Accu-Chek  Softclix Lancets lancets Use as instructed to check blood sugar 1-2 times daily Dx E11.9 10/23/23   Emilie Harden, MD  Blood Glucose Monitoring Suppl (ACCU-CHEK GUIDE ME) w/Device KIT USE AS INSTRUCTED TO CHECK BLOOD SUGAR 2 TIMES DAILY DX E11.9 12/25/23   Emilie Harden, MD    Physical Exam: Vitals:   03/05/24 1812  BP: (!) 154/72  Pulse: (!) 101  Resp: 17  TempSrc: Oral  SpO2: 100%   Physical Exam Constitutional:      Appearance: Normal appearance. She is not ill-appearing.  HENT:     Head: Normocephalic and atraumatic.     Mouth/Throat:     Mouth: Mucous membranes are moist.     Pharynx: Oropharynx is clear. No oropharyngeal exudate.  Eyes:     General: No scleral icterus.    Extraocular Movements: Extraocular movements intact.     Pupils: Pupils are equal, round, and reactive to light.     Comments: Mild conjunctival pallor noted.  Cardiovascular:     Rate and Rhythm: Normal rate and regular rhythm.     Heart sounds: Normal heart sounds. No murmur heard.    No friction rub. No gallop.  Pulmonary:     Effort: Pulmonary effort is normal. No respiratory distress.     Breath sounds: Normal breath sounds. No wheezing, rhonchi or rales.  Abdominal:     General: Bowel sounds are normal. There is no distension.     Palpations: Abdomen is soft.     Tenderness: There is no abdominal tenderness. There is no guarding or rebound.  Musculoskeletal:        General: No swelling.     Cervical back: Normal range of motion.     Comments: Left shoulder in sling.  Skin:    General: Skin is warm and dry.  Neurological:     General: No focal deficit present.     Mental Status: She is alert and oriented to person, place, and time.  Psychiatric:        Mood and Affect: Mood normal.  Behavior: Behavior normal.     Data Reviewed:  There are no new results to review at this time.     Latest Ref Rng & Units 03/05/2024    1:48 PM 05/15/2023    6:09 PM 12/14/2022    1:44 PM   CBC  WBC 4.0 - 10.5 K/uL 8.0  6.5  7.6   Hemoglobin 12.0 - 15.0 g/dL 6.5  16.1  9.3   Hematocrit 36.0 - 46.0 % 22.4  35.3  31.9   Platelets 150 - 400 K/uL 470  221  346       Latest Ref Rng & Units 03/05/2024    1:48 PM 05/15/2023    6:09 PM 12/14/2022    1:44 PM  CMP  Glucose 70 - 99 mg/dL 096  045  409   BUN 8 - 23 mg/dL 26  18  24    Creatinine 0.44 - 1.00 mg/dL 8.11  9.14  7.82   Sodium 135 - 145 mmol/L 135  134  140   Potassium 3.5 - 5.1 mmol/L 5.0  4.4  4.4   Chloride 98 - 111 mmol/L 105  100  106   CO2 22 - 32 mmol/L 22  25  25    Calcium  8.9 - 10.3 mg/dL 8.8  8.7  8.9   Total Protein 6.5 - 8.1 g/dL 7.9  7.9  8.5   Total Bilirubin 0.0 - 1.2 mg/dL 0.4  0.4  0.5   Alkaline Phos 38 - 126 U/L 68  80  117   AST 15 - 41 U/L 12  18  16    ALT 0 - 44 U/L 8  13  8      Assessment and Plan: No notes have been filed under this hospital service. Service: Hospitalist  Acute on chronic microcytic anemia Patient presenting after outpatient CBC showing hemoglobin 6.5.  She does endorse mildly increased fatigue but otherwise is asymptomatic from the standpoint.  No reported hematochezia, melena, hematemesis, hematuria.  Low suspicion for GI bleed.  Last CBC on file from about 9 months ago with hemoglobin 10.9 but her baseline hemoglobin appears to be around 8.  Suspect this is a gradual downtrend in her hemoglobin, possibly secondary to iron deficiency given microcytosis.  She is agreeable to receiving a blood transfusion.  Patient is currently hemodynamically stable. - Type and screen - 1 unit PRBC (RN to order post-transfusion CBC) - trend hgb curve, transfuse if hgb <7 and/or symptomatic - Follow-up iron studies, folate, B12 levels - Carb modified diet, n.p.o. at midnight - SCDs for DVT prophylaxis - Place in observation/progressive - Telemetry  Left proximal humerus surgical neck fracture Osteoporosis Secondary to mechanical fall sustained this past Saturday.  Plan for left total  shoulder arthroplasty tomorrow with Dr. Deeann Fare.  I have reached out to Dr. Deeann Fare and he is aware that patient is here.  Will medically optimize overnight and current plan is to proceed with surgery tomorrow.  Patient is on denosumab  every 6 months in the outpatient setting for osteoporosis, will hold while inpatient. - Orthopedics following, appreciate management -Plan for left total shoulder arthroplasty tomorrow morning - Tylenol  every 6 hours as needed for mild pain - Oxycodone  5 mg every 6 hours as needed for moderate and severe pain  Hypertension Blood pressure 154/72 on arrival.  She is on diltiazem  to 40 mg daily and losartan  100 mg daily at home. - Resume home antihypertensives  Type 2 diabetes with hyperglycemia A1c 8.2% on 02/20/2024.  Patient is  on metformin  500 mg twice daily and semaglutide  1 mg weekly at home.  Blood glucose 163 on CMP today. - Moderate SSI - Trend CBGs, goal 140-180 - Holding home metformin  and semaglutide   History of CVA Hyperlipidemia - Resume home pravastatin  - Holding home baby aspirin   Stage IIIa CKD Baseline creatinine around 1.15-1.25.  On admission, creatinine 1.31. - Chronic and stable - Monitor urine output - Avoid nephrotoxic medications as able - Trend kidney function as needed  Moderate persistent asthma -Chronic and stable, no wheezing on exam, saturating well on room air - Resume home bronchodilators  Anxiety and depression - Resume home Cymbalta  and buspirone   GERD - Resume home PPI  Overactive bladder - Resume home Myrbetriq   Advance Care Planning:   Code Status: Full Code   Consults: orthopedics (have messaged Dr. Deeann Fare to make him aware of direct admission)  Family Communication: no family at bedside  Severity of Illness: The appropriate patient status for this patient is OBSERVATION. Observation status is judged to be reasonable and necessary in order to provide the required intensity of service to ensure the  patient's safety. The patient's presenting symptoms, physical exam findings, and initial radiographic and laboratory data in the context of their medical condition is felt to place them at decreased risk for further clinical deterioration. Furthermore, it is anticipated that the patient will be medically stable for discharge from the hospital within 2 midnights of admission.   Portions of this note were generated with Scientist, clinical (histocompatibility and immunogenetics). Dictation errors may occur despite best attempts at proofreading.  Author: Mandy Second, MD 03/05/2024 7:20 PM  For on call review [email or web address].

## 2024-03-05 NOTE — Progress Notes (Signed)
 Pt admitted to unit, pt is a direct admit from home, alert and oriented x4, room air, in no acute distress. Provider Jinwala,MD made aware that patient was admitted. Awaiting orders. Report given to oncoming nurse.

## 2024-03-05 NOTE — Patient Instructions (Addendum)
 SURGICAL WAITING ROOM VISITATION Patients having surgery or a procedure may have no more than 2 support people in the waiting area - these visitors may rotate.    Children under the age of 3 must have an adult with them who is not the patient.  If the patient needs to stay at the hospital during part of their recovery, the visitor guidelines for inpatient rooms apply. Pre-op nurse will coordinate an appropriate time for 1 support person to accompany patient in pre-op.  This support person may not rotate.    Please refer to the [facility] website for the visitor guidelines for Inpatients (after your surgery is over and you are in a regular room).       Your procedure is scheduled on: 03-06-24   Report to [facility] Main Entrance    Report to admitting at 5:15 AM   Call this number if you have problems the morning of surgery [phone]   Do not eat food :After Midnight.   After Midnight you may have the following liquids until 4:30 AM DAY OF SURGERY  Water  Non-Citrus Juices (without pulp, NO RED-Apple, White grape, White cranberry) Black Coffee (NO MILK/CREAM OR CREAMERS, sugar ok)  Clear Tea (NO MILK/CREAM OR CREAMERS, sugar ok) regular and decaf                             Plain Jell-O (NO RED)                                           Fruit ices (not with fruit pulp, NO RED)                                     Popsicles (NO RED)                                                               Sports drinks like Gatorade (NO RED)                   The day of surgery:  Drink ONE (1) Pre-Surgery Clear G2 by 4:30 AM the morning of surgery. Drink in one sitting. Do not sip.  This drink was given to you during your hospital  pre-op appointment visit. Nothing else to drink after completing the Pre-Surgery G2.          If you have questions, please contact your surgeon's office.   FOLLOW ANY ADDITIONAL PRE OP INSTRUCTIONS YOU RECEIVED FROM YOUR SURGEON'S OFFICE!!!      Oral Hygiene is also important to reduce your risk of infection.                                    Remember - BRUSH YOUR TEETH THE MORNING OF SURGERY WITH YOUR REGULAR TOOTHPASTE   Do NOT smoke after Midnight   Take these medicines the morning of surgery with A SIP OF WATER :    Amlodipine    Diltiazem   Duloxetine    Mirabegron   Omeprazole   Pravastatin    Primidone    If needed Tylenol , Oxycodone    Okay to use inhalers     Stop all vitamins and herbal supplements 7 days before surgery  How to Manage Your Diabetes Before and After Surgery  Why is it important to control my blood sugar before and after surgery? Improving blood sugar levels before and after surgery helps healing and can limit problems. A way of improving blood sugar control is eating a healthy diet by:  Eating less sugar and carbohydrates  Increasing activity/exercise  Talking with your doctor about reaching your blood sugar goals High blood sugars (greater than 180 mg/dL) can raise your risk of infections and slow your recovery, so you will need to focus on controlling your diabetes during the weeks before surgery. Make sure that the doctor who takes care of your diabetes knows about your planned surgery including the date and location.  How do I manage my blood sugar before surgery? Check your blood sugar at least 4 times a day, starting 2 days before surgery, to make sure that the level is not too high or low. Check your blood sugar the morning of your surgery when you wake up and every 2 hours until you get to the Short Stay unit. If your blood sugar is less than 70 mg/dL, you will need to treat for low blood sugar: Do not take insulin . Treat a low blood sugar (less than 70 mg/dL) with  cup of clear juice (cranberry or apple), 4 glucose tablets, OR glucose gel. Recheck blood sugar in 15 minutes after treatment (to make sure it is greater than 70 mg/dL). If your blood sugar is not greater than 70 mg/dL on  recheck, call [phone] for further instructions. Report your blood sugar to the short stay nurse when you get to Short Stay.  If you are admitted to the hospital after surgery: Your blood sugar will be checked by the staff and you will probably be given insulin  after surgery (instead of oral diabetes medicines) to make sure you have good blood sugar levels. The goal for blood sugar control after surgery is 80-180 mg/dL.   WHAT DO I DO ABOUT MY DIABETES MEDICATION?  Do not take oral diabetes medicines (pills) the morning of surgery.       Hold Semaglutide  until after surgery   DO NOT TAKE THE FOLLOWING 7 DAYS PRIOR TO SURGERY: Ozempic , Wegovy , Rybelsus  (Semaglutide ), Byetta (exenatide), Bydureon (exenatide ER), Victoza, Saxenda (liraglutide), or Trulicity  (dulaglutide ) Mounjaro (Tirzepatide) Adlyxin (Lixisenatide), Polyethylene Glycol Loxenatide.  Reviewed and Endorsed by [facility] Patient Education Committee, August 2015                              You may not have any metal on your body including hair pins, jewelry, and body piercing             Do not wear make-up, lotions, powders, perfumes, or deodorant  Do not wear nail polish including gel and S&S, artificial/acrylic nails, or any other type of covering on natural nails including finger and toenails. If you have artificial nails, gel coating, etc. that needs to be removed by a nail salon please have this removed prior to surgery or surgery may need to be canceled/ delayed if the surgeon/ anesthesia feels like they are unable to be safely monitored.   Do not shave  48 hours prior to surgery.  Do not bring valuables to the hospital. [facility] IS NOT RESPONSIBLE   FOR VALUABLES.   Contacts, dentures or bridgework may not be worn into surgery.   Bring small overnight bag day of surgery.   DO NOT BRING YOUR HOME MEDICATIONS TO THE HOSPITAL. PHARMACY WILL DISPENSE MEDICATIONS LISTED ON YOUR MEDICATION LIST TO YOU  DURING YOUR ADMISSION IN THE HOSPITAL!   Special Instructions: Bring a copy of your healthcare power of attorney and living will documents the day of surgery if you haven't scanned them before.              Please read over the following fact sheets you were given: IF YOU HAVE QUESTIONS ABOUT YOUR PRE-OP INSTRUCTIONS PLEASE CALL [phone]  If you received a COVID test during your pre-op visit  it is requested that you wear a mask when out in public, stay away from anyone that may not be feeling well and notify your surgeon if you develop symptoms. If you test positive for Covid or have been in contact with anyone that has tested positive in the last 10 days please notify you surgeon. [place]- Preparing for Total Shoulder Arthroplasty    Before surgery, you can play an important role. Because skin is not sterile, your skin needs to be as free of germs as possible. You can reduce the number of germs on your skin by using the following products. Benzoyl Peroxide Gel Reduces the number of germs present on the skin Applied twice a day to shoulder area starting two days before surgery    ==================================================================  Please follow these instructions carefully:  BENZOYL PEROXIDE 5% GEL  Please do not use if you have an allergy to benzoyl peroxide.   If your skin becomes reddened/irritated stop using the benzoyl peroxide.  Starting two days before surgery, apply as follows: Apply benzoyl peroxide in the morning and at night. Apply after taking a shower. If you are not taking a shower clean entire shoulder front, back, and side along with the armpit with a clean wet washcloth.  Place a quarter-sized dollop on your shoulder and rub in thoroughly, making sure to cover the front, back, and side of your shoulder, along with the armpit.   2 days before ____ AM   ____ PM              1 day before ____ AM   ____ PM                         Do this twice a day  for two days.  (Last application is the night before surgery, AFTER using the CHG soap as described below).  Do NOT apply benzoyl peroxide gel on the day of surgery.    - Preparing for Surgery Before surgery, you can play an important role.  Because skin is not sterile, your skin needs to be as free of germs as possible.  You can reduce the number of germs on your skin by washing with CHG (chlorahexidine gluconate) soap before surgery.  CHG is an antiseptic cleaner which kills germs and bonds with the skin to continue killing germs even after washing. Please DO NOT use if you have an allergy to CHG or antibacterial soaps.  If your skin becomes reddened/irritated stop using the CHG and inform your nurse when you arrive at Short Stay. Do not shave (including legs and underarms) for at least 48 hours prior to the first  CHG shower.  You may shave your face/neck.  Please follow these instructions carefully:  1.  Shower with CHG Soap the night before surgery and the  morning of surgery.  2.  If you choose to wash your hair, wash your hair first as usual with your normal  shampoo.  3.  After you shampoo, rinse your hair and body thoroughly to remove the shampoo.                             4.  Use CHG as you would any other liquid soap.  You can apply chg directly to the skin and wash.  Gently with a scrungie or clean washcloth.  5.  Apply the CHG Soap to your body ONLY FROM THE NECK DOWN.   Do   not use on face/ open                           Wound or open sores. Avoid contact with eyes, ears mouth and   genitals (private parts).                       Wash face,  Genitals (private parts) with your normal soap.             6.  Wash thoroughly, paying special attention to the area where your    surgery  will be performed.  7.  Thoroughly rinse your body with warm water  from the neck down.  8.  DO NOT shower/wash with your normal soap after using and rinsing off the CHG Soap.                9.   Pat yourself dry with a clean towel.            10.  Wear clean pajamas.            11.  Place clean sheets on your bed the night of your first shower and do not  sleep with pets. Day of Surgery : Do not apply any lotions/deodorants the morning of surgery.  Please wear clean clothes to the hospital/surgery center.  FAILURE TO FOLLOW THESE INSTRUCTIONS MAY RESULT IN THE CANCELLATION OF YOUR SURGERY  PATIENT SIGNATURE_________________________________  NURSE SIGNATURE__________________________________  ________________________________________________________________________    Benjamen Brand  An incentive spirometer is a tool that can help keep your lungs clear and active. This tool measures how well you are filling your lungs with each breath. Taking long deep breaths may help reverse or decrease the chance of developing breathing (pulmonary) problems (especially infection) following: A long period of time when you are unable to move or be active. BEFORE THE PROCEDURE  If the spirometer includes an indicator to show your best effort, your nurse or respiratory therapist will set it to a desired goal. If possible, sit up straight or lean slightly forward. Try not to slouch. Hold the incentive spirometer in an upright position. INSTRUCTIONS FOR USE  Sit on the edge of your bed if possible, or sit up as far as you can in bed or on a chair. Hold the incentive spirometer in an upright position. Breathe out normally. Place the mouthpiece in your mouth and seal your lips tightly around it. Breathe in slowly and as deeply as possible, raising the piston or the ball toward the top of the column. Hold your breath for 3-5 seconds or for  as long as possible. Allow the piston or ball to fall to the bottom of the column. Remove the mouthpiece from your mouth and breathe out normally. Rest for a few seconds and repeat Steps 1 through 7 at least 10 times every 1-2 hours when you are awake. Take  your time and take a few normal breaths between deep breaths. The spirometer may include an indicator to show your best effort. Use the indicator as a goal to work toward during each repetition. After each set of 10 deep breaths, practice coughing to be sure your lungs are clear. If you have an incision (the cut made at the time of surgery), support your incision when coughing by placing a pillow or rolled up towels firmly against it. Once you are able to get out of bed, walk around indoors and cough well. You may stop using the incentive spirometer when instructed by your caregiver.  RISKS AND COMPLICATIONS Take your time so you do not get dizzy or light-headed. If you are in pain, you may need to take or ask for pain medication before doing incentive spirometry. It is harder to take a deep breath if you are having pain. AFTER USE Rest and breathe slowly and easily. It can be helpful to keep track of a log of your progress. Your caregiver can provide you with a simple table to help with this. If you are using the spirometer at home, follow these instructions: SEEK MEDICAL CARE IF:  You are having difficultly using the spirometer. You have trouble using the spirometer as often as instructed. Your pain medication is not giving enough relief while using the spirometer. You develop fever of 100.5 F (38.1 C) or higher. SEEK IMMEDIATE MEDICAL CARE IF:  You cough up bloody sputum that had not been present before. You develop fever of 102 F (38.9 C) or greater. You develop worsening pain at or near the incision site. MAKE SURE YOU:  Understand these instructions. Will watch your condition. Will get help right away if you are not doing well or get worse. Document Released: 02/11/2007 Document Revised: 12/24/2011 Document Reviewed: 04/14/2007 [facility] Patient Information 2014 [place], [place].   ________________________________________________________________________

## 2024-03-05 NOTE — Progress Notes (Addendum)
 Notified of patient's abnormal CBC with hgb of 6.5 at 3:30pm. No recent value to compare - last hgb was 11 in Dec 2024. Discussed with PA [place] with Dr. Prudence Brown office and they will arrange direct admission for transfusion and medical optimization prior to surgery

## 2024-03-05 NOTE — Progress Notes (Addendum)
 COVID Vaccine Completed:  Yes  Date of COVID positive in last 90 days:  Bronchitis 02-21-24  PCP - Dorthula Gavel, MD Cardiologist - N/A Neurologist Jacky Massing, MD Pulmonologist - Milissa Alley, PA-C  Chest x-ray - 03-05-24 Epic EKG - 05-15-23 Epic Stress Test - 12-05-13 Epic ECHO - 06-26-22 Epic Cardiac Cath - N/A Pacemaker/ICD device last checked: Spinal Cord Stimulator: N/A  Bowel Prep - N/A  Sleep Study - N/A CPAP -   Fasting Blood Sugar - 100 to 150 Checks Blood Sugar - once or twice a week   Semaglutide   Last dose of GLP1 agonist- 02-27-24 GLP1 instructions:  Hold until after surgery     Last dose of SGLT-2 inhibitors-  N/A SGLT-2 instructions:  Hold 3 days before surgery   Blood Thinner Instructions:  Last dose:   Time: Aspirin  Instructions:  ASA 81 Last Dose:  03-04-24  Activity level:  Can go up a flight of stairs and perform activities of daily living without stopping and without symptoms of chest pain.  Patient states that she has shortness of breath with activity at times.  This has not worsened   Anesthesia review:  A1c 8.2, recent bonchitis 02-21-24, hx of stroke, mesenteric artery stenosis, cirrhosis, HTN, CKD, DM  Patient states that the cough from bronchitis resolved a week ago and she has no other symptoms.  Patient denies shortness of breath, fever, cough and chest pain at PAT appointment  Patient verbalized understanding of instructions that were given to them at the PAT appointment. Patient was also instructed that they will need to review over the PAT instructions again at home before surgery.

## 2024-03-06 ENCOUNTER — Observation Stay (HOSPITAL_COMMUNITY): Admitting: Physician Assistant

## 2024-03-06 ENCOUNTER — Encounter (HOSPITAL_COMMUNITY): Payer: Self-pay | Admitting: Student

## 2024-03-06 ENCOUNTER — Encounter: Payer: Self-pay | Admitting: Rheumatology

## 2024-03-06 ENCOUNTER — Ambulatory Visit (HOSPITAL_COMMUNITY): Admission: RE | Admit: 2024-03-06 | Source: Home / Self Care | Admitting: Orthopedic Surgery

## 2024-03-06 ENCOUNTER — Encounter (HOSPITAL_COMMUNITY)
Disposition: A | Discharge status: Home / Self Care | Payer: Self-pay | Source: Ambulatory Visit | Attending: Internal Medicine

## 2024-03-06 ENCOUNTER — Other Ambulatory Visit: Payer: Self-pay

## 2024-03-06 ENCOUNTER — Other Ambulatory Visit (HOSPITAL_COMMUNITY): Payer: Self-pay

## 2024-03-06 ENCOUNTER — Observation Stay (HOSPITAL_COMMUNITY): Admitting: Anesthesiology

## 2024-03-06 DIAGNOSIS — S42202A Unspecified fracture of upper end of left humerus, initial encounter for closed fracture: Secondary | ICD-10-CM | POA: Diagnosis not present

## 2024-03-06 DIAGNOSIS — S42292A Other displaced fracture of upper end of left humerus, initial encounter for closed fracture: Secondary | ICD-10-CM | POA: Diagnosis not present

## 2024-03-06 DIAGNOSIS — E1122 Type 2 diabetes mellitus with diabetic chronic kidney disease: Secondary | ICD-10-CM | POA: Diagnosis not present

## 2024-03-06 DIAGNOSIS — D649 Anemia, unspecified: Secondary | ICD-10-CM | POA: Diagnosis not present

## 2024-03-06 DIAGNOSIS — E119 Type 2 diabetes mellitus without complications: Secondary | ICD-10-CM

## 2024-03-06 DIAGNOSIS — Z96612 Presence of left artificial shoulder joint: Secondary | ICD-10-CM | POA: Diagnosis not present

## 2024-03-06 DIAGNOSIS — I10 Essential (primary) hypertension: Secondary | ICD-10-CM | POA: Diagnosis not present

## 2024-03-06 DIAGNOSIS — I129 Hypertensive chronic kidney disease with stage 1 through stage 4 chronic kidney disease, or unspecified chronic kidney disease: Secondary | ICD-10-CM

## 2024-03-06 DIAGNOSIS — N1831 Chronic kidney disease, stage 3a: Secondary | ICD-10-CM

## 2024-03-06 HISTORY — PX: REVERSE SHOULDER ARTHROPLASTY: SHX5054

## 2024-03-06 LAB — BASIC METABOLIC PANEL WITH GFR
Anion gap: 7 (ref 5–15)
BUN: 18 mg/dL (ref 8–23)
CO2: 22 mmol/L (ref 22–32)
Calcium: 8.6 mg/dL — ABNORMAL LOW (ref 8.9–10.3)
Chloride: 105 mmol/L (ref 98–111)
Creatinine, Ser: 1 mg/dL (ref 0.44–1.00)
GFR, Estimated: >60 mL/min (ref >60)
Glucose, Bld: 182 mg/dL — ABNORMAL HIGH (ref 70–99)
Potassium: 4.6 mmol/L (ref 3.5–5.1)
Sodium: 134 mmol/L — ABNORMAL LOW (ref 135–145)

## 2024-03-06 LAB — POCT I-STAT, CHEM 8
BUN: 19 mg/dL (ref 8–23)
Calcium, Ion: 1.17 mmol/L (ref 1.15–1.40)
Chloride: 106 mmol/L (ref 98–111)
Creatinine, Ser: 1.1 mg/dL — ABNORMAL HIGH (ref 0.44–1.00)
Glucose, Bld: 173 mg/dL — ABNORMAL HIGH (ref 70–99)
HCT: 24 % — ABNORMAL LOW (ref 36.0–46.0)
Hemoglobin: 8.2 g/dL — ABNORMAL LOW (ref 12.0–15.0)
Potassium: 5 mmol/L (ref 3.5–5.1)
Sodium: 136 mmol/L (ref 135–145)
TCO2: 22 mmol/L (ref 22–32)

## 2024-03-06 LAB — GLUCOSE, CAPILLARY
Glucose-Capillary: 176 mg/dL — ABNORMAL HIGH (ref 70–99)
Glucose-Capillary: 147 mg/dL — ABNORMAL HIGH (ref 70–99)
Glucose-Capillary: 181 mg/dL — ABNORMAL HIGH (ref 70–99)
Glucose-Capillary: 333 mg/dL — ABNORMAL HIGH (ref 70–99)
Glucose-Capillary: 194 mg/dL — ABNORMAL HIGH (ref 70–99)

## 2024-03-06 LAB — HIV ANTIBODY (ROUTINE TESTING W REFLEX): HIV Screen 4th Generation wRfx: NONREACTIVE

## 2024-03-06 LAB — PREPARE RBC (CROSSMATCH)

## 2024-03-06 LAB — CBC
HCT: 25.1 % — ABNORMAL LOW (ref 36.0–46.0)
Hemoglobin: 7.3 g/dL — ABNORMAL LOW (ref 12.0–15.0)
MCH: 21.1 pg — ABNORMAL LOW (ref 26.0–34.0)
MCHC: 29.1 g/dL — ABNORMAL LOW (ref 30.0–36.0)
MCV: 72.5 fL — ABNORMAL LOW (ref 80.0–100.0)
Platelets: 449 10*3/uL — ABNORMAL HIGH (ref 150–400)
RBC: 3.46 MIL/uL — ABNORMAL LOW (ref 3.87–5.11)
RDW: 20.3 % — ABNORMAL HIGH (ref 11.5–15.5)
WBC: 7.4 10*3/uL (ref 4.0–10.5)
nRBC: 0.4 % — ABNORMAL HIGH (ref 0.0–0.2)

## 2024-03-06 LAB — HEMOGLOBIN AND HEMATOCRIT, BLOOD
HCT: 24.9 % — ABNORMAL LOW (ref 36.0–46.0)
HCT: 26.1 % — ABNORMAL LOW (ref 36.0–46.0)
Hemoglobin: 7.4 g/dL — ABNORMAL LOW (ref 12.0–15.0)
Hemoglobin: 7.6 g/dL — ABNORMAL LOW (ref 12.0–15.0)

## 2024-03-06 LAB — PROTIME-INR
INR: 1.1 (ref 0.8–1.2)
Prothrombin Time: 14.6 s (ref 11.4–15.2)

## 2024-03-06 SURGERY — ARTHROPLASTY, SHOULDER, TOTAL, REVERSE
Anesthesia: General | Site: Shoulder | Laterality: Left

## 2024-03-06 MED ORDER — PHENYLEPHRINE 80 MCG/ML (10ML) SYRINGE FOR IV PUSH (FOR BLOOD PRESSURE SUPPORT)
PREFILLED_SYRINGE | INTRAVENOUS | Status: DC | PRN
  Administered 2024-03-06: 160 ug via INTRAVENOUS
  Administered 2024-03-06: 120 ug via INTRAVENOUS

## 2024-03-06 MED ORDER — PRIMIDONE 250 MG PO TABS
125.0000 mg | ORAL_TABLET | Freq: Two times a day (BID) | ORAL | Status: DC
  Administered 2024-03-07: 125 mg via ORAL
  Filled 2024-03-06: qty 1

## 2024-03-06 MED ORDER — ONDANSETRON HCL 4 MG PO TABS: 4.0000 mg | ORAL_TABLET | Freq: Four times a day (QID) | ORAL | Status: DC | PRN

## 2024-03-06 MED ORDER — SODIUM CHLORIDE 0.9 % IV SOLN: INTRAVENOUS | Status: DC

## 2024-03-06 MED ORDER — DIPHENHYDRAMINE HCL 12.5 MG/5ML PO ELIX
12.5000 mg | ORAL_SOLUTION | ORAL | Status: DC | PRN
Start: 2024-03-06 — End: 2024-03-07

## 2024-03-06 MED ORDER — ORAL CARE MOUTH RINSE: 15.0000 mL | Freq: Once | OROMUCOSAL | Status: AC

## 2024-03-06 MED ORDER — MIDAZOLAM HCL 5 MG/5ML IJ SOLN
INTRAMUSCULAR | Status: DC | PRN
Start: 2024-03-06 — End: 2024-03-06
  Administered 2024-03-06 (×2): 1 mg via INTRAVENOUS

## 2024-03-06 MED ORDER — CHLORHEXIDINE GLUCONATE 4 % EX SOLN
1.0000 | CUTANEOUS | 1 refills | Status: DC
  Filled 2024-03-06: qty 946, 14d supply, fill #0

## 2024-03-06 MED ORDER — FENTANYL CITRATE (PF) 100 MCG/2ML IJ SOLN
INTRAMUSCULAR | Status: AC
  Filled 2024-03-06: qty 2

## 2024-03-06 MED ORDER — OXYCODONE HCL 5 MG PO TABS
5.0000 mg | ORAL_TABLET | ORAL | 0 refills | Status: DC | PRN
  Filled 2024-03-06: qty 30, 3d supply, fill #0

## 2024-03-06 MED ORDER — BUPIVACAINE LIPOSOME 1.3 % IJ SUSP
INTRAMUSCULAR | Status: DC | PRN
  Administered 2024-03-06: 10 mL

## 2024-03-06 MED ORDER — ONDANSETRON HCL 4 MG/2ML IJ SOLN
4.0000 mg | Freq: Four times a day (QID) | INTRAMUSCULAR | Status: DC | PRN
  Administered 2024-03-07: 4 mg via INTRAVENOUS
  Filled 2024-03-06: qty 2

## 2024-03-06 MED ORDER — GUAIFENESIN-DM 100-10 MG/5ML PO SYRP
5.0000 mL | ORAL_SOLUTION | ORAL | Status: DC | PRN
  Administered 2024-03-06: 5 mL via ORAL
  Filled 2024-03-06: qty 5

## 2024-03-06 MED ORDER — PROPOFOL 10 MG/ML IV BOLUS
INTRAVENOUS | Status: DC | PRN
  Administered 2024-03-06: 30 mg via INTRAVENOUS
  Administered 2024-03-06: 100 mg via INTRAVENOUS

## 2024-03-06 MED ORDER — ROCURONIUM BROMIDE 10 MG/ML (PF) SYRINGE
PREFILLED_SYRINGE | INTRAVENOUS | Status: AC
  Filled 2024-03-06: qty 10

## 2024-03-06 MED ORDER — METHOCARBAMOL 1000 MG/10ML IJ SOLN: 500.0000 mg | Freq: Four times a day (QID) | INTRAMUSCULAR | Status: DC | PRN

## 2024-03-06 MED ORDER — POLYETHYLENE GLYCOL 3350 17 G PO PACK: 17.0000 g | PACK | Freq: Every day | ORAL | Status: DC | PRN

## 2024-03-06 MED ORDER — WATER FOR IRRIGATION, STERILE IR SOLN
Status: DC | PRN
  Administered 2024-03-06: 2000 mL

## 2024-03-06 MED ORDER — OXYCODONE HCL 5 MG/5ML PO SOLN: 5.0000 mg | Freq: Once | ORAL | Status: DC | PRN

## 2024-03-06 MED ORDER — MIDAZOLAM HCL 2 MG/2ML IJ SOLN
INTRAMUSCULAR | Status: AC
  Filled 2024-03-06: qty 2

## 2024-03-06 MED ORDER — OXYCODONE HCL 5 MG PO TABS
5.0000 mg | ORAL_TABLET | ORAL | Status: DC | PRN
  Filled 2024-03-06: qty 2

## 2024-03-06 MED ORDER — ESMOLOL HCL 100 MG/10ML IV SOLN
INTRAVENOUS | Status: DC | PRN
  Administered 2024-03-06: 50 ug via INTRAVENOUS
  Administered 2024-03-06: 30 ug via INTRAVENOUS

## 2024-03-06 MED ORDER — FLEET ENEMA RE ENEM: 1.0000 | ENEMA | Freq: Once | RECTAL | Status: DC | PRN

## 2024-03-06 MED ORDER — METFORMIN HCL ER 500 MG PO TB24: 500.0000 mg | ORAL_TABLET | Freq: Every day | ORAL | Status: DC

## 2024-03-06 MED ORDER — DEXAMETHASONE SODIUM PHOSPHATE 10 MG/ML IJ SOLN
INTRAMUSCULAR | Status: AC
  Filled 2024-03-06: qty 1

## 2024-03-06 MED ORDER — OXYCODONE HCL 5 MG PO TABS: 5.0000 mg | ORAL_TABLET | Freq: Once | ORAL | Status: DC | PRN

## 2024-03-06 MED ORDER — SODIUM CHLORIDE 0.9% IV SOLUTION: Freq: Once | INTRAVENOUS | Status: AC

## 2024-03-06 MED ORDER — PHENOL 1.4 % MT LIQD: 1.0000 | OROMUCOSAL | Status: DC | PRN

## 2024-03-06 MED ORDER — ONDANSETRON HCL 4 MG/2ML IJ SOLN
INTRAMUSCULAR | Status: DC | PRN
  Administered 2024-03-06: 4 mg via INTRAVENOUS

## 2024-03-06 MED ORDER — DEXAMETHASONE SODIUM PHOSPHATE 10 MG/ML IJ SOLN
INTRAMUSCULAR | Status: DC | PRN
  Administered 2024-03-06: 10 mg via INTRAVENOUS

## 2024-03-06 MED ORDER — POVIDONE-IODINE 7.5 % EX SOLN: Freq: Once | CUTANEOUS | Status: AC

## 2024-03-06 MED ORDER — METHOCARBAMOL 500 MG PO TABS
500.0000 mg | ORAL_TABLET | Freq: Four times a day (QID) | ORAL | Status: DC | PRN
  Administered 2024-03-07: 500 mg via ORAL
  Filled 2024-03-06: qty 1

## 2024-03-06 MED ORDER — ALUM & MAG HYDROXIDE-SIMETH 200-200-20 MG/5ML PO SUSP: 30.0000 mL | ORAL | Status: DC | PRN

## 2024-03-06 MED ORDER — CHLORHEXIDINE GLUCONATE 0.12 % MT SOLN
15.0000 mL | Freq: Once | OROMUCOSAL | Status: AC
  Administered 2024-03-06: 15 mL via OROMUCOSAL

## 2024-03-06 MED ORDER — MUPIROCIN 2 % EX OINT
1.0000 | TOPICAL_OINTMENT | Freq: Two times a day (BID) | CUTANEOUS | 0 refills | Status: AC
Start: 2024-03-06 — End: 2024-04-09
  Filled 2024-03-06: qty 66, 33d supply, fill #0

## 2024-03-06 MED ORDER — BISACODYL 5 MG PO TBEC: 5.0000 mg | DELAYED_RELEASE_TABLET | Freq: Every day | ORAL | Status: DC | PRN

## 2024-03-06 MED ORDER — HYDROMORPHONE HCL 1 MG/ML IJ SOLN: 0.5000 mg | INTRAMUSCULAR | Status: DC | PRN

## 2024-03-06 MED ORDER — LIDOCAINE HCL (PF) 2 % IJ SOLN
INTRAMUSCULAR | Status: AC
  Filled 2024-03-06: qty 5

## 2024-03-06 MED ORDER — PHENYLEPHRINE HCL-NACL 20-0.9 MG/250ML-% IV SOLN
INTRAVENOUS | Status: AC
  Filled 2024-03-06: qty 250

## 2024-03-06 MED ORDER — MELATONIN 3 MG PO TABS
12.0000 mg | ORAL_TABLET | Freq: Every evening | ORAL | Status: DC | PRN
Start: 2024-03-06 — End: 2024-03-07

## 2024-03-06 MED ORDER — PROPOFOL 10 MG/ML IV BOLUS
INTRAVENOUS | Status: AC
  Filled 2024-03-06: qty 20

## 2024-03-06 MED ORDER — FENTANYL CITRATE (PF) 100 MCG/2ML IJ SOLN
INTRAMUSCULAR | Status: DC | PRN
  Administered 2024-03-06: 100 ug via INTRAVENOUS

## 2024-03-06 MED ORDER — ROCURONIUM BROMIDE 10 MG/ML (PF) SYRINGE
PREFILLED_SYRINGE | INTRAVENOUS | Status: DC | PRN
Start: 2024-03-06 — End: 2024-03-06
  Administered 2024-03-06: 60 mg via INTRAVENOUS
  Administered 2024-03-06: 5 mg via INTRAVENOUS

## 2024-03-06 MED ORDER — CEFAZOLIN SODIUM-DEXTROSE 2-4 GM/100ML-% IV SOLN
2.0000 g | INTRAVENOUS | Status: AC
Start: 2024-03-06 — End: 2024-03-06
  Administered 2024-03-06: 2 g via INTRAVENOUS
  Filled 2024-03-06: qty 100

## 2024-03-06 MED ORDER — PHENYLEPHRINE HCL-NACL 20-0.9 MG/250ML-% IV SOLN
INTRAVENOUS | Status: DC | PRN
Start: 2024-03-06 — End: 2024-03-06
  Administered 2024-03-06: 30 ug/min via INTRAVENOUS

## 2024-03-06 MED ORDER — ACETAMINOPHEN 325 MG PO TABS: 325.0000 mg | ORAL_TABLET | Freq: Four times a day (QID) | ORAL | Status: DC | PRN

## 2024-03-06 MED ORDER — ACETAMINOPHEN 500 MG PO TABS
1000.0000 mg | ORAL_TABLET | Freq: Four times a day (QID) | ORAL | Status: AC
  Administered 2024-03-06 – 2024-03-07 (×4): 1000 mg via ORAL
  Filled 2024-03-06 (×4): qty 2

## 2024-03-06 MED ORDER — AMLODIPINE BESYLATE 5 MG PO TABS: 2.5000 mg | ORAL_TABLET | Freq: Every day | ORAL | Status: DC

## 2024-03-06 MED ORDER — ASPIRIN 81 MG PO TBEC
81.0000 mg | DELAYED_RELEASE_TABLET | Freq: Every day | ORAL | Status: DC
  Administered 2024-03-07: 81 mg via ORAL
  Filled 2024-03-06: qty 1

## 2024-03-06 MED ORDER — OXYCODONE HCL 5 MG PO TABS
10.0000 mg | ORAL_TABLET | ORAL | Status: DC | PRN
  Administered 2024-03-07: 10 mg via ORAL

## 2024-03-06 MED ORDER — DOCUSATE SODIUM 100 MG PO CAPS
100.0000 mg | ORAL_CAPSULE | Freq: Two times a day (BID) | ORAL | Status: DC
  Administered 2024-03-06 – 2024-03-07 (×2): 100 mg via ORAL
  Filled 2024-03-06 (×2): qty 1

## 2024-03-06 MED ORDER — FERROUS SULFATE 325 (65 FE) MG PO TABS
325.0000 mg | ORAL_TABLET | Freq: Every day | ORAL | Status: DC
  Administered 2024-03-07: 325 mg via ORAL
  Filled 2024-03-06: qty 1

## 2024-03-06 MED ORDER — BUPIVACAINE HCL (PF) 0.5 % IJ SOLN
INTRAMUSCULAR | Status: DC | PRN
  Administered 2024-03-06: 10 mL

## 2024-03-06 MED ORDER — SODIUM CHLORIDE 0.9 % IR SOLN
Status: DC | PRN
  Administered 2024-03-06: 1000 mL

## 2024-03-06 MED ORDER — ONDANSETRON HCL 4 MG/2ML IJ SOLN: 4.0000 mg | Freq: Once | INTRAMUSCULAR | Status: DC | PRN

## 2024-03-06 MED ORDER — ESMOLOL HCL 100 MG/10ML IV SOLN
INTRAVENOUS | Status: AC
  Filled 2024-03-06: qty 10

## 2024-03-06 MED ORDER — FENTANYL CITRATE PF 50 MCG/ML IJ SOSY: 25.0000 ug | PREFILLED_SYRINGE | INTRAMUSCULAR | Status: DC | PRN

## 2024-03-06 MED ORDER — MENTHOL 3 MG MT LOZG
1.0000 | LOZENGE | OROMUCOSAL | Status: DC | PRN
  Administered 2024-03-06: 3 mg via ORAL
  Filled 2024-03-06: qty 9

## 2024-03-06 MED ORDER — ASPIRIN 81 MG PO TBEC: 81.0000 mg | DELAYED_RELEASE_TABLET | Freq: Two times a day (BID) | ORAL | Status: AC

## 2024-03-06 MED ORDER — LACTATED RINGERS IV SOLN: INTRAVENOUS | Status: DC

## 2024-03-06 MED ORDER — TRANEXAMIC ACID-NACL 1000-0.7 MG/100ML-% IV SOLN
1000.0000 mg | INTRAVENOUS | Status: AC
  Administered 2024-03-06: 1000 mg via INTRAVENOUS
  Filled 2024-03-06: qty 100

## 2024-03-06 MED ORDER — INSULIN ASPART 100 UNIT/ML IJ SOLN
0.0000 [IU] | INTRAMUSCULAR | Status: DC | PRN
  Administered 2024-03-06: 2 [IU] via SUBCUTANEOUS

## 2024-03-06 MED ORDER — SUGAMMADEX SODIUM 200 MG/2ML IV SOLN
INTRAVENOUS | Status: DC | PRN
  Administered 2024-03-06: 175 mg via INTRAVENOUS

## 2024-03-06 MED ORDER — ONDANSETRON HCL 4 MG/2ML IJ SOLN
INTRAMUSCULAR | Status: AC
  Filled 2024-03-06: qty 2

## 2024-03-06 SURGICAL SUPPLY — 70 items
BAG COUNTER SPONGE SURGICOUNT (BAG) IMPLANT
BAG ZIPLOCK 12X15 (MISCELLANEOUS) ×1 IMPLANT
BASEPLATE P2 COATD GLND 6.5X30 (Shoulder) IMPLANT
BIT DRILL 2.5 DIA 127 CALI (BIT) IMPLANT
BLADE SAW SGTL 73X25 THK (BLADE) ×1 IMPLANT
BOOTIES KNEE HIGH SLOAN (MISCELLANEOUS) ×2 IMPLANT
COOLER ICEMAN CLASSIC (MISCELLANEOUS) ×1 IMPLANT
COVER BACK TABLE 60X90IN (DRAPES) ×1 IMPLANT
COVER SURGICAL LIGHT HANDLE (MISCELLANEOUS) ×1 IMPLANT
DRAPE INCISE IOBAN 66X45 STRL (DRAPES) ×1 IMPLANT
DRAPE POUCH INSTRU U-SHP 10X18 (DRAPES) ×1 IMPLANT
DRAPE SHEET LG 3/4 BI-LAMINATE (DRAPES) ×1 IMPLANT
DRAPE SURG 17X11 SM STRL (DRAPES) ×1 IMPLANT
DRAPE SURG ORHT 6 SPLT 77X108 (DRAPES) ×2 IMPLANT
DRAPE TOP 10253 STERILE (DRAPES) ×1 IMPLANT
DRAPE U-SHAPE 47X51 STRL (DRAPES) ×1 IMPLANT
DRILL GLEN ALTIVATE 3.5 (DRILL) IMPLANT
DRSG AQUACEL AG ADV 3.5X 6 (GAUZE/BANDAGES/DRESSINGS) ×1 IMPLANT
DURAPREP 26ML APPLICATOR (WOUND CARE) ×2 IMPLANT
ELECT BLADE TIP CTD 4 INCH (ELECTRODE) ×1 IMPLANT
ELECT PENCIL ROCKER SW 15FT (MISCELLANEOUS) ×1 IMPLANT
ELECT REM PT RETURN 15FT ADLT (MISCELLANEOUS) ×1 IMPLANT
FACESHIELD WRAPAROUND (MASK) ×1 IMPLANT
FACESHIELD WRAPAROUND OR TEAM (MASK) ×1 IMPLANT
GLOVE BIO SURGEON STRL SZ7.5 (GLOVE) ×1 IMPLANT
GLOVE BIOGEL PI IND STRL 6.5 (GLOVE) ×1 IMPLANT
GLOVE BIOGEL PI IND STRL 8 (GLOVE) ×1 IMPLANT
GLOVE SURG SS PI 6.5 STRL IVOR (GLOVE) ×1 IMPLANT
GOWN STRL REUS W/ TWL LRG LVL3 (GOWN DISPOSABLE) ×1 IMPLANT
GOWN STRL REUS W/ TWL XL LVL3 (GOWN DISPOSABLE) ×1 IMPLANT
HOOD PEEL AWAY T7 (MISCELLANEOUS) ×3 IMPLANT
INSERT SMALL SOCKET 32MM NEU (Insert) IMPLANT
KIT BASIN OR (CUSTOM PROCEDURE TRAY) ×1 IMPLANT
KIT TURNOVER KIT A (KITS) ×1 IMPLANT
MANIFOLD NEPTUNE II (INSTRUMENTS) ×1 IMPLANT
NDL MA TROC 1/2 (NEEDLE) IMPLANT
NDL TROCAR POINT SZ 2 1/2 (NEEDLE) IMPLANT
NEEDLE MA TROC 1/2 (NEEDLE) ×1 IMPLANT
NEEDLE TROCAR POINT SZ 2 1/2 (NEEDLE) IMPLANT
NS IRRIG 1000ML POUR BTL (IV SOLUTION) ×1 IMPLANT
PACK SHOULDER (CUSTOM PROCEDURE TRAY) ×1 IMPLANT
PAD COLD SHLDR WRAP-ON (PAD) ×1 IMPLANT
RESTRAINT HEAD UNIVERSAL NS (MISCELLANEOUS) ×1 IMPLANT
RETRIEVER SUT HEWSON (MISCELLANEOUS) IMPLANT
SCREW BONE LOCKING RSP 5.0X14 (Screw) ×2 IMPLANT
SCREW BONE LOCKING RSP 5.0X30 (Screw) ×1 IMPLANT
SCREW BONE RSP LOCK 5X14 (Screw) IMPLANT
SCREW BONE RSP LOCK 5X26 (Screw) IMPLANT
SCREW BONE RSP LOCK 5X30 (Screw) IMPLANT
SCREW BONE RSP LOCKING 5.0X26 (Screw) ×1 IMPLANT
SCREW RETAIN W/HEAD 4MM OFFSET (Shoulder) IMPLANT
SET HNDPC FAN SPRY TIP SCT (DISPOSABLE) ×1 IMPLANT
SLING ARM FOAM STRAP LRG (SOFTGOODS) IMPLANT
SLING ARM FOAM STRAP MED (SOFTGOODS) IMPLANT
STEM HUMERAL REVERSE S 10X108 (Stem) IMPLANT
STRIP CLOSURE SKIN 1/2X4 (GAUZE/BANDAGES/DRESSINGS) ×1 IMPLANT
SUCTION TUBE FRAZIER 10FR DISP (SUCTIONS) IMPLANT
SUPPORT WRAP ARM LG (MISCELLANEOUS) ×1 IMPLANT
SUT ETHIBOND 2 V 37 (SUTURE) IMPLANT
SUT MNCRL AB 4-0 PS2 18 (SUTURE) ×1 IMPLANT
SUT VIC AB 2-0 CT1 TAPERPNT 27 (SUTURE) ×2 IMPLANT
SUTURE FIBERWR#2 38 REV NDL BL (SUTURE) IMPLANT
SUTURE TAPE 1.3 40 TPR END (SUTURE) IMPLANT
TAP SURG THRD DJ 6.5 (ORTHOPEDIC DISPOSABLE SUPPLIES) IMPLANT
TAPE LABRALWHITE 1.5X36 (TAPE) IMPLANT
TAPE STRIPS DRAPE STRL (GAUZE/BANDAGES/DRESSINGS) IMPLANT
TAPE SUT LABRALTAP WHT/BLK (SUTURE) IMPLANT
TOWEL OR 17X26 10 PK STRL BLUE (TOWEL DISPOSABLE) ×1 IMPLANT
TUBE SUCTION HIGH CAP CLEAR NV (SUCTIONS) ×1 IMPLANT
WATER STERILE IRR 1000ML POUR (IV SOLUTION) ×1 IMPLANT

## 2024-03-06 NOTE — Plan of Care (Signed)
  Problem: Activity: Goal: Risk for activity intolerance will decrease Outcome: Progressing   Problem: Coping: Goal: Level of anxiety will decrease Outcome: Progressing   Problem: Pain Managment: Goal: General experience of comfort will improve and/or be controlled Outcome: Progressing   Problem: Safety: Goal: Ability to remain free from injury will improve Outcome: Progressing   Problem: Nutritional: Goal: Maintenance of adequate nutrition will improve Outcome: Progressing   Problem: Tissue Perfusion: Goal: Adequacy of tissue perfusion will improve Outcome: Progressing   Problem: Pain Management: Goal: Pain level will decrease with appropriate interventions Outcome: Progressing

## 2024-03-06 NOTE — Anesthesia Postprocedure Evaluation (Signed)
 Anesthesia Post Note  Patient: Carrie [name]  Procedure(s) Performed: ARTHROPLASTY, SHOULDER, TOTAL, REVERSE (Left: Shoulder)     Patient location during evaluation: PACU Anesthesia Type: General Level of consciousness: awake and alert Pain management: pain level controlled Vital Signs Assessment: post-procedure vital signs reviewed and stable Respiratory status: spontaneous breathing, nonlabored ventilation, respiratory function stable and patient connected to nasal cannula oxygen Cardiovascular status: blood pressure returned to baseline and stable Postop Assessment: no apparent nausea or vomiting Anesthetic complications: no   No notable events documented.  Last Vitals:  Vitals:   03/06/24 1042 03/06/24 1209  BP: 138/67 (!) 117/55  Pulse: 93 92  Resp: 16 18  Temp: 36.6 C 36.5 C  SpO2: 99% 98%    Last Pain:  Vitals:   03/06/24 1209  TempSrc: Oral  PainSc:                  [name]

## 2024-03-06 NOTE — Care Management Obs Status (Signed)
 MEDICARE OBSERVATION STATUS NOTIFICATION   Patient Details  Name: Carrie [name] MRN: [id number] Date of Birth: 02-Dec-1955   Medicare Observation Status Notification Given:  Yes    Marty Sleet, LCSW 03/06/2024, 2:45 PM

## 2024-03-06 NOTE — Anesthesia Procedure Notes (Signed)
 Anesthesia Regional Block: Interscalene brachial plexus block   Pre-Anesthetic Checklist: , timeout performed,  Correct Patient, Correct Site, Correct Laterality,  Correct Procedure, Correct Position, site marked,  Risks and benefits discussed,  Surgical consent,  Pre-op evaluation,  At surgeon's request and post-op pain management  Laterality: Left  Prep: Maximum Sterile Barrier Precautions used, chloraprep       Needles:  Injection technique: Single-shot  Needle Type: Echogenic Needle      Needle Gauge: 20     Additional Needles:   Procedures:,,,, ultrasound used (permanent image in chart),,    Narrative:  Start time: 03/06/2024 7:15 AM End time: 03/06/2024 7:21 AM Injection made incrementally with aspirations every 5 mL.  Performed by: Personally  Anesthesiologist: [name], MD

## 2024-03-06 NOTE — Anesthesia Preprocedure Evaluation (Addendum)
 Anesthesia Evaluation  Patient identified by MRN, date of birth, ID band Patient awake    Reviewed: Allergy & Precautions, NPO status , Patient's Chart, lab work & pertinent test results, reviewed documented beta blocker date and time   History of Anesthesia Complications (+) Family history of anesthesia reactionNegative for: history of anesthetic complications  Airway Mallampati: III  TM Distance: >3 FB   Mouth opening: Limited Mouth Opening  Dental  (+) Edentulous Upper, Edentulous Lower   Pulmonary shortness of breath and with exertion, asthma , COPD, neg recent URI   breath sounds clear to auscultation       Cardiovascular hypertension, (-) angina + Peripheral Vascular Disease  (-) CAD, (-) Past MI and (-) Cardiac Stents  Rhythm:Regular Rate:Tachycardia     Neuro/Psych  PSYCHIATRIC DISORDERS Anxiety Depression    TIACVA, No Residual Symptoms    GI/Hepatic ,GERD  ,,(+) Cirrhosis         Endo/Other  diabetes, Type 2    Renal/GU CRFRenal disease     Musculoskeletal  (+) Arthritis ,    Abdominal   Peds  Hematology  (+) Blood dyscrasia, anemia   Anesthesia Other Findings   Reproductive/Obstetrics                             Anesthesia Physical Anesthesia Plan  ASA: 3  Anesthesia Plan: General   Post-op Pain Management: Regional block*   Induction: Intravenous  PONV Risk Score and Plan: 2 and Ondansetron  and Dexamethasone   Airway Management Planned: Oral ETT  Additional Equipment:   Intra-op Plan:   Post-operative Plan: Extubation in OR  Informed Consent: I have reviewed the patients History and Physical, chart, labs and discussed the procedure including the risks, benefits and alternatives for the proposed anesthesia with the patient or authorized representative who has indicated his/her understanding and acceptance.       Plan Discussed with: CRNA  Anesthesia Plan  Comments: (Hgb 8.2 per ISTAT this morning)       Anesthesia Quick Evaluation

## 2024-03-06 NOTE — Anesthesia Procedure Notes (Signed)
 Procedure Name: Intubation Date/Time: 03/06/2024 7:46 AM  Performed by: Rhian Funari D, CRNAPre-anesthesia Checklist: Patient identified, Emergency Drugs available, Suction available and Patient being monitored Patient Re-evaluated:Patient Re-evaluated prior to induction Oxygen Delivery Method: Circle system utilized Preoxygenation: Pre-oxygenation with 100% oxygen Induction Type: IV induction Ventilation: Mask ventilation without difficulty Laryngoscope Size: Mac and 3 Grade View: Grade II Tube type: Oral Tube size: 7.0 mm Number of attempts: 1 Airway Equipment and Method: Stylet and Oral airway Placement Confirmation: ETT inserted through vocal cords under direct vision, positive ETCO2 and breath sounds checked- equal and bilateral Secured at: 21 cm Tube secured with: Tape Dental Injury: Teeth and Oropharynx as per pre-operative assessment

## 2024-03-06 NOTE — Subjective & Objective (Addendum)
 Pt seen and examined.  Pt given 1 additional unit of PRBC last night due to 250 ml recorded EBL during surgery yesterday.  HgB up to 8.4 g/dl today. Discussed with pt that she will need to complete colonoscopy for iron deficiency anemia workup.  Pt states she had colonoscopy scheduled for last month but canceled it due to her brother having surgery. Pt advised to complete colonoscopy soon.

## 2024-03-06 NOTE — Assessment & Plan Note (Addendum)
 03-06-2024 continue SSI. Hold metformin .  03-07-2024 stable.

## 2024-03-06 NOTE — Assessment & Plan Note (Addendum)
 03-06-2024 monitor Scr with blood loss.  03-07-2024 stable.

## 2024-03-06 NOTE — Assessment & Plan Note (Addendum)
 03-06-2024 hold ARB for now. Continue cardizem . Stop norvasc  as they are both calcium  channel blockers.  03-07-2024 stable.

## 2024-03-06 NOTE — Progress Notes (Signed)
   03/06/24 1448  TOC Brief Assessment  Insurance and Status Reviewed  Patient has primary care physician Yes  Home environment has been reviewed Single family home  Prior level of function: Independent  Prior/Current Home Services No current home services  Social Drivers of Health Review SDOH reviewed no interventions necessary  Readmission risk has been reviewed Yes  Transition of care needs transition of care needs identified, TOC will continue to follow

## 2024-03-06 NOTE — Plan of Care (Signed)
 No acute events overnight. 1 unit of PRBC given. Pt given gatorade as per pre-surgical orders. Problem: Education: Goal: Knowledge of General Education information will improve Description: Including pain rating scale, medication(s)/side effects and non-pharmacologic comfort measures Outcome: Progressing   Problem: Health Behavior/Discharge Planning: Goal: Ability to manage health-related needs will improve Outcome: Progressing   Problem: Clinical Measurements: Goal: Ability to maintain clinical measurements within normal limits will improve Outcome: Progressing Goal: Will remain free from infection Outcome: Progressing Goal: Diagnostic test results will improve Outcome: Progressing Goal: Respiratory complications will improve Outcome: Progressing Goal: Cardiovascular complication will be avoided Outcome: Progressing   Problem: Activity: Goal: Risk for activity intolerance will decrease Outcome: Progressing   Problem: Nutrition: Goal: Adequate nutrition will be maintained Outcome: Progressing   Problem: Coping: Goal: Level of anxiety will decrease Outcome: Progressing   Problem: Elimination: Goal: Will not experience complications related to bowel motility Outcome: Progressing Goal: Will not experience complications related to urinary retention Outcome: Progressing   Problem: Pain Managment: Goal: General experience of comfort will improve and/or be controlled Outcome: Progressing   Problem: Safety: Goal: Ability to remain free from injury will improve Outcome: Progressing   Problem: Skin Integrity: Goal: Risk for impaired skin integrity will decrease Outcome: Progressing   Problem: Education: Goal: Ability to describe self-care measures that may prevent or decrease complications (Diabetes Survival Skills Education) will improve Outcome: Progressing Goal: Individualized Educational Video(s) Outcome: Progressing   Problem: Coping: Goal: Ability to adjust to  condition or change in health will improve Outcome: Progressing   Problem: Fluid Volume: Goal: Ability to maintain a balanced intake and output will improve Outcome: Progressing   Problem: Health Behavior/Discharge Planning: Goal: Ability to identify and utilize available resources and services will improve Outcome: Progressing Goal: Ability to manage health-related needs will improve Outcome: Progressing   Problem: Metabolic: Goal: Ability to maintain appropriate glucose levels will improve Outcome: Progressing   Problem: Nutritional: Goal: Maintenance of adequate nutrition will improve Outcome: Progressing Goal: Progress toward achieving an optimal weight will improve Outcome: Progressing   Problem: Skin Integrity: Goal: Risk for impaired skin integrity will decrease Outcome: Progressing   Problem: Tissue Perfusion: Goal: Adequacy of tissue perfusion will improve Outcome: Progressing

## 2024-03-06 NOTE — Assessment & Plan Note (Addendum)
 03-06-2024 s/p 2 units PRBC. Post-transfusion HgB of 7.4 g/dl.  Pt lost about 250 ml of blood. Will repeat HgB at 6 pm and transfuse as needed. Pt will need PCP to get her outpatient iron infusions. She is iron deficient.  Pt had EGD with [facility] 03-2023 that was negative. Colonoscopy by [facility] in 11/27/18 that showed some polyps. Pt is due for repeat colonoscopy. Pt's PCP should refer patient back to GI for further workup of her iron deficiency anemia.  Iron/TIBC/Ferritin/ %Sat    Component Value Date/Time   IRON 19 (L) 03/05/2024 1957   TIBC 351 03/05/2024 1957   FERRITIN 28 03/05/2024 1957   IRONPCTSAT 5 (L) 03/05/2024 1957   IRONPCTSAT 12 (L) 05/05/2010 1128   03-07-2024 Pt given 1 additional unit of PRBC last night due to 250 ml recorded EBL during surgery yesterday.   HgB up to 8.4 g/dl today. Discussed with pt that she will need to complete colonoscopy for iron deficiency anemia workup.   Pt states she had colonoscopy scheduled for last month but canceled it due to her brother having surgery. Pt advised to complete colonoscopy soon.  PCP will need to refer pt for outpatient iron infusions.

## 2024-03-06 NOTE — Assessment & Plan Note (Addendum)
 03-06-2024 had surgery today. Ortho responsible for their own f/u appointments and opiate Rx.  03-07-2024 pt awaiting PT evaluation. Stable for DC otherwise. Had family at home to help.

## 2024-03-06 NOTE — Interval H&P Note (Signed)
 History and Physical Interval Note:  03/06/2024 6:21 AM  Carrie [name]  has presented today for surgery, with the diagnosis of LEFT SHOULDER PROXIMAL HUMERUS FRACTURE.  The various methods of treatment have been discussed with the patient and family. After consideration of risks, benefits and other options for treatment, the patient has consented to  Procedure(s): ARTHROPLASTY, SHOULDER, TOTAL, REVERSE (Left) as a surgical intervention.  The patient's history has been reviewed, patient examined, no change in status, stable for surgery.  I have reviewed the patient's chart and labs.  Questions were answered to the patient's satisfaction.     Audrene Blessing

## 2024-03-06 NOTE — Transfer of Care (Signed)
 Immediate Anesthesia Transfer of Care Note  Patient: Carrie [name]  Procedure(s) Performed: ARTHROPLASTY, SHOULDER, TOTAL, REVERSE (Left: Shoulder)  Patient Location: PACU  Anesthesia Type:General and Regional  Level of Consciousness: awake, alert , and oriented  Airway & Oxygen Therapy: Patient Spontanous Breathing and Patient connected to face mask oxygen  Post-op Assessment: Report given to RN and Post -op Vital signs reviewed and stable  Post vital signs: Reviewed and stable  Last Vitals:  Vitals Value Taken Time  BP 147/69 03/06/24 0924  Temp    Pulse 97 03/06/24 0927  Resp 20 03/06/24 0927  SpO2 100 % 03/06/24 0927  Vitals shown include unfiled device data.  Last Pain:  Vitals:   03/06/24 0641  TempSrc: Oral  PainSc: 6       Patients Stated Pain Goal: 5 (03/06/24 0641)  Complications: No notable events documented.

## 2024-03-06 NOTE — Discharge Instructions (Signed)
 Discharge Instructions after Reverse Total Shoulder Arthroplasty   A sling has been provided for you. You are to wear this at all times (except for bathing and dressing), until your first post operative visit with Dr. Deeann Fare. Please also wear while sleeping at night. While you bath and dress, let the arm/elbow extend straight down to stretch your elbow. Wiggle your fingers and pump your first while your in the sling to prevent hand swelling. Use ice on the shoulder intermittently over the first 48 hours after surgery. Continue to use ice or and ice machine as needed after 48 hours for pain control/swelling.  Pain medicine has been prescribed for you.  Use your medicine liberally over the first 48 hours, and then you can begin to taper your use. You may take Extra Strength Tylenol  or Tylenol  only in place of the pain pills. DO NOT take ANY nonsteroidal anti-inflammatory pain medications: Advil, Motrin, Ibuprofen, Aleve , Naproxen  or Naprosyn .  Take two 81mg  aspirin  a day for 2 weeks after surgery, unless you have an aspirin  sensitivity/allergy or asthma.  Leave your dressing on until your first follow up visit.  You may shower with the dressing.  Hold your arm as if you still have your sling on while you shower. Simply allow the water  to wash over the site and then pat dry. Make sure your axilla (armpit) is completely dry after showering.    Please call [phone] during normal business hours or [phone] after hours for any problems. Including the following:  - excessive redness of the incisions - drainage for more than 4 days - fever of more than 101.5 F  *Please note that pain medications will not be refilled after hours or on weekends.

## 2024-03-06 NOTE — Progress Notes (Signed)
 PROGRESS NOTE    Carrie [name]  BJY:[id number] DOB: June 29, 1956 DOA: 03/05/2024 PCP: Jacqulyne Maxim, MD  Subjective: Pt seen and examined. Met with pt, her dtr and dtr-in-law at bedside. S/p left shoulder total arthroplasty. S/p 2 units PRBC.  Pt has been on oral iron in the past. Has had constipation and abd pain with oral iron. Has not had IV iron infusions before.  Pt had left UE nerve block. No pain in left shoulder.   Hospital Course: HPI: Carrie [name] is a 68 y.o. female with medical history significant of anemia of chronic disease, hypertension, type 2 diabetes, history of CVA, stage IIIa CKD, psoriatic arthritis, osteoporosis, moderate persistent asthma, hyperlipidemia, GERD, anxiety and depression presenting to the ED for low hemoglobin.   Patient reports having sustained a mechanical fall this past Saturday after tripping on her dog collar and falling onto her left shoulder.  She is scheduled for left total shoulder arthroplasty tomorrow morning.  She underwent preop testing today and was noted to have a hemoglobin of 6.5 on CBC this afternoon.  She was ultimately directly admitted into the hospital for medical optimization prior to shoulder arthroplasty tomorrow.   Patient reports that she has been feeling generally well aside from pain in her left shoulder.  She denies any hematochezia, melena, hematemesis, hematuria.  She does endorse chronic mild shortness of breath with exertion secondary to her underlying asthma but has not noted any worsening.  She does endorse mildly increased fatigue in her day-to-day activities but this is not something that is new for her.  She otherwise denies any fevers, chills, nausea, vomiting, chest pain, palpitations, cough, abdominal pain, urinary changes.   Preop testing with CBC showing hemoglobin 6.5, MCV 71.1, reactive thrombocytosis.  Preop CMP showing glucose 163, creatinine 1.31 (around her baseline), albumin 2.9.  Initial vital signs on  arrival with mild tachycardia and mildly elevated blood pressure.  Significant Events: Admitted 03/05/2024 for acute on chronic microcytic anemia   Admission Labs: WBC 8.0, HgB 6.5, plt 470 Na 135, K 5.0, BUN 26, Scr 1.31, glu 163 AST 12, ALT 8, alk phos 68  Admission Imaging Studies: CXR No active cardiopulmonary disease   Significant Labs:   Significant Imaging Studies:   Antibiotic Therapy: Anti-infectives (From admission, onward)    Start     Dose/Rate Route Frequency Ordered Stop   03/06/24 0645  ceFAZolin  (ANCEF ) IVPB 2g/100 mL premix        2 g 200 mL/hr over 30 Minutes Intravenous On call to O.R. 03/06/24 0643 03/07/24 0559       Procedures:   Consultants:     Assessment and Plan: * Acute on chronic anemia 03-06-2024 s/p 2 units PRBC. Post-transfusion HgB of 7.4 g/dl.  Pt lost about 250 ml of blood. Will repeat HgB at 6 pm and transfuse as needed. Pt will need PCP to get her outpatient iron infusions. She is iron deficient.  Pt had EGD with [facility] 03-2023 that was negative. Colonoscopy by [facility] in 11/27/18 that showed some polyps. Pt is due for repeat colonoscopy. Pt's PCP should refer patient back to GI for further workup of her iron deficiency anemia.  Iron/TIBC/Ferritin/ %Sat    Component Value Date/Time   IRON 19 (L) 03/05/2024 1957   TIBC 351 03/05/2024 1957   FERRITIN 28 03/05/2024 1957   IRONPCTSAT 5 (L) 03/05/2024 1957   IRONPCTSAT 12 (L) 05/05/2010 1128     S/P reverse total shoulder arthroplasty, left 03-06-2024 had  surgery today. Ortho responsible for their own f/u appointments and opiate Rx.  Stage 3a chronic kidney disease (CKD) (HCC) 03-06-2024 monitor Scr with blood loss.  DM2 (diabetes mellitus, type 2) (HCC) 03-06-2024 continue SSI. Hold metformin .  Benign essential HTN 03-06-2024 hold ARB for now. Continue cardizem . Stop norvasc  as they are both calcium  channel blockers.   DVT prophylaxis: SCD's Start: 03/06/24  1100 Place and maintain sequential compression device Start: 03/05/24 1934    Code Status: Full Code Family Communication: discussed with pt, dtr and dtr-in-law Disposition Plan: return home Reason for continuing need for hospitalization: monitoring HgB after surgery. Transfuse PRBC as needed.  Objective: Vitals:   03/06/24 1030 03/06/24 1042 03/06/24 1209 03/06/24 1400  BP: 138/73 138/67 (!) 117/55   Pulse: 97 93 92   Resp: 17 16 18    Temp:  97.9 F (36.6 C) 97.7 F (36.5 C)   TempSrc:  Oral Oral   SpO2: 90% 99% 98% 98%  Weight:      Height:        Intake/Output Summary (Last 24 hours) at 03/06/2024 1427 Last data filed at 03/06/2024 1400 Gross per 24 hour  Intake 1349.71 ml  Output 750 ml  Net 599.71 ml   Filed Weights   03/06/24 0102  Weight: 68 kg    Examination:  Physical Exam Vitals and nursing note reviewed.  Constitutional:      Appearance: She is obese.  HENT:     Head: Normocephalic and atraumatic.     Nose: Nose normal.  Cardiovascular:     Rate and Rhythm: Normal rate and regular rhythm.  Pulmonary:     Effort: Pulmonary effort is normal.     Breath sounds: Normal breath sounds.  Abdominal:     General: Bowel sounds are normal. There is no distension.     Palpations: Abdomen is soft.  Musculoskeletal:     Comments: Left UE in sling.  Skin:    General: Skin is warm and dry.     Capillary Refill: Capillary refill takes less than 2 seconds.  Neurological:     General: No focal deficit present.     Mental Status: She is alert and oriented to person, place, and time.     Data Reviewed: I have personally reviewed following labs and imaging studies  CBC: Recent Labs  Lab 03/05/24 1348 03/06/24 0700 03/06/24 0704 03/06/24 0945  WBC 8.0 7.4  --   --   HGB 6.5* 7.3* 8.2* 7.4*  HCT 22.4* 25.1* 24.0* 24.9*  MCV 71.1* 72.5*  --   --   PLT 470* 449*  --   --    Basic Metabolic Panel: Recent Labs  Lab 03/05/24 1348 03/06/24 0521  03/06/24 0704  NA 135 134* 136  K 5.0 4.6 5.0  CL 105 105 106  CO2 22 22  --   GLUCOSE 163* 182* 173*  BUN 26* 18 19  CREATININE 1.31* 1.00 1.10*  CALCIUM  8.8* 8.6*  --    GFR: Estimated Creatinine Clearance: 43.3 mL/min (A) (by C-G formula based on SCr of 1.1 mg/dL (H)). Liver Function Tests: Recent Labs  Lab 03/05/24 1348  AST 12*  ALT 8  ALKPHOS 68  BILITOT 0.4  PROT 7.9  ALBUMIN 2.9*   Coagulation Profile: Recent Labs  Lab 03/06/24 0700  INR 1.1   CBG: Recent Labs  Lab 03/05/24 1329 03/05/24 2059 03/06/24 0644 03/06/24 0937 03/06/24 1203  GLUCAP 159* 186* 176* 147* 181*   Anemia Panel: Recent  Labs    03/05/24 1957 03/05/24 2151  VITAMINB12 501  --   FOLATE  --  6.0  FERRITIN 28  --   TIBC 351  --   IRON 19*  --     Recent Results (from the past 240 hours)  Surgical pcr screen     Status: Abnormal   Collection Time: 03/05/24  3:04 PM   Specimen: Nasal Mucosa; Nasal Swab  Result Value Ref Range Status   MRSA, PCR NEGATIVE NEGATIVE Final   Staphylococcus aureus POSITIVE (A) NEGATIVE Final    Comment: (NOTE) The Xpert SA Assay (FDA approved for NASAL specimens in patients 57 years of age and older), is one component of a comprehensive surveillance program. It is not intended to diagnose infection nor to guide or monitor treatment. Performed at [facility], [address], [place], [place] [address]      Radiology Studies: DG Chest 2 View Result Date: 03/05/2024 CLINICAL DATA:  Preop exam.  Recent cough, now resolved. EXAM: CHEST - 2 VIEW COMPARISON:  Chest radiograph 05/15/2023 FINDINGS: The cardiomediastinal contours are normal. The lungs are clear. Pulmonary vasculature is normal. No consolidation, pleural effusion, or pneumothorax. Left proximal humeral fracture was characterized on recent imaging. Kyphoplasty within a vertebra at the thoracolumbar junction. IMPRESSION: No active cardiopulmonary disease. Electronically  Signed   By: Chadwick Colonel M.D.   On: 03/05/2024 15:53    Scheduled Meds:  acetaminophen   1,000 mg Oral Q6H   [START ON 03/07/2024] aspirin  EC  81 mg Oral Daily   budesonide  (PULMICORT ) nebulizer solution  0.25 mg Nebulization BID   busPIRone   10 mg Oral QHS   Chlorhexidine  Gluconate Cloth  6 each Topical Daily   diltiazem   240 mg Oral Daily   docusate sodium   100 mg Oral BID   DULoxetine   60 mg Oral Daily   [START ON 03/07/2024] ferrous sulfate   325 mg Oral Daily   insulin  aspart  0-15 Units Subcutaneous TID WC   mirabegron ER  25 mg Oral Daily   mupirocin ointment  1 Application Nasal BID   pantoprazole   40 mg Oral Daily   pravastatin   80 mg Oral Daily   [START ON 03/07/2024] primidone   125 mg Oral BID   Continuous Infusions:  sodium chloride  40 mL/hr at 03/06/24 1158     LOS: 1 day   Time spent: 55 minutes  Unk Garb, DO  [facility]  03/06/2024, 2:27 PM

## 2024-03-06 NOTE — Op Note (Addendum)
 Procedure(s): ARTHROPLASTY, SHOULDER, TOTAL, REVERSE Procedure Note  Carrie [name] female 68 y.o. 03/06/2024  Preoperative diagnosis: Left shoulder displaced comminuted proximal humerus fracture  Postoperative diagnosis: Same  Procedure performed: Left shoulder reverse total shoulder arthroplasty ORIF left shoulder tuberosity fractures   Indications:  68 y.o. female  With comminuted displaced left proximal humerus fracture with poor preinjury function with likely chronic rotator cuff disease.  Indicated for reverse total shoulder arthroplasty to try and improve functional outcome.     Surgeon: Audrene Blessing   Assistants: Sidra Dredge PA-C [name] was present and scrubbed throughout the procedure and was essential in positioning, retraction, exposure, and closure)  Anesthesia: General endotracheal anesthesia with preoperative interscalene block given by the attending anesthesiologist     Procedure Detail  ARTHROPLASTY, SHOULDER, TOTAL, REVERSE   Estimated Blood Loss:  200 mL         Drains: none  Blood Given: none          Specimens: none        Complications:  * No complications entered in OR log *         Disposition: PACU - hemodynamically stable.         Condition: stable      OPERATIVE FINDINGS:  A DJO Altivate pressfit reverse total shoulder arthroplasty was placed with a  size 10 stem, a 32-4 glenosphere, and a standard-mm poly insert. The base plate  fixation was excellent.  The tuberosities were repaired around the implant.   PROCEDURE: The patient was identified in the preoperative holding area  where I personally marked the operative site after verifying site, side,  and procedure with the patient. An interscalene block given by  the attending anesthesiologist in the holding area and the patient was taken back to the operating room where all extremities were  carefully padded in position after general anesthesia was induced. She  was placed in  a beach-chair position and the operative upper extremity was  prepped and draped in a standard sterile fashion. An approximately 10-  cm incision was made from the tip of the coracoid process to the center  point of the humerus at the level of the axilla. Dissection was carried  down through subcutaneous tissues to the level of the cephalic vein  which was taken laterally with the deltoid. The pectoralis major was  retracted medially. The subdeltoid space was developed and the lateral  edge of the conjoined tendon was identified. The undersurface of  conjoined tendon was palpated and the musculocutaneous nerve was not in  the field. Retractor was placed underneath the conjoined and second  retractor was placed lateral into the deltoid.  The biceps tendon was traced proximally to identify the rotator interval.   The tuberosities were mobilized with a Cobb elevator and a rondure.  Once adequately mobilized the greater tuberosity was prepared with 4 suture tapes with 2 superior into the inferior.  These were tagged for later repair.  The tuberosities were also controlled with Ethibond sutures in the lesser and greater tuberosity for control.  The glenoid was exposed with the arm in an  abducted extended position. The anterior and posterior labrum were  completely excised and the capsule was released circumferentially to  allow for exposure of the glenoid for preparation.  Any remaining glenoid cartilage was removed.  The 2.5 mm drill was  placed using the guide in 5-10 inferior angulation and the tap was then advanced in the same hole. Small and large reamers  were then used. The tap was then removed and the Metaglene was then screwed in with excellent purchase.  The peripheral guide was then used to drilled measured and filled peripheral locking screws. The size 32-4 glenosphere was then impacted on the [facility] taper and the central screw was placed.   The proximal humeral shaft was then again  exposed and the diaphyseal reamers were used to size the canal. The final broach was left in place in the proximal trial was placed. The joint was reduced and the tuberosities were brought around the implant to assess height and soft tissue tension.  The above implant was felt to be stable and with appropriate soft tissue tension.  Therefore, final humeral stem was placed press-fit.  The final polyethylene component was impacted.  The joint was reduced and again the soft tissue tension and length were felt to be appropriate.   The tuberosities were then repaired around the implant with 4 suture tapes which gave excellent reconstruction of the tuberosities. The joint was then copiously irrigated with pulse lavage and the wound was then closed.  Skin was closed with 2-0 Vicryl in a deep dermal layer and 4-0  Monocryl for skin closure. Steri-Strips were applied. Sterile  dressings were then applied as well as a sling. The patient was allowed  to awaken from general anesthesia, transferred to stretcher, and taken  to recovery room in stable condition.   POSTOPERATIVE PLAN: The patient will be kept in the hospital postoperatively  for pain control and observation.

## 2024-03-07 ENCOUNTER — Other Ambulatory Visit (HOSPITAL_COMMUNITY): Payer: Self-pay

## 2024-03-07 DIAGNOSIS — N1831 Chronic kidney disease, stage 3a: Secondary | ICD-10-CM | POA: Diagnosis not present

## 2024-03-07 DIAGNOSIS — I129 Hypertensive chronic kidney disease with stage 1 through stage 4 chronic kidney disease, or unspecified chronic kidney disease: Secondary | ICD-10-CM | POA: Diagnosis not present

## 2024-03-07 DIAGNOSIS — S42292A Other displaced fracture of upper end of left humerus, initial encounter for closed fracture: Secondary | ICD-10-CM | POA: Diagnosis not present

## 2024-03-07 DIAGNOSIS — I10 Essential (primary) hypertension: Secondary | ICD-10-CM | POA: Diagnosis not present

## 2024-03-07 DIAGNOSIS — E1122 Type 2 diabetes mellitus with diabetic chronic kidney disease: Secondary | ICD-10-CM | POA: Diagnosis not present

## 2024-03-07 DIAGNOSIS — Z96612 Presence of left artificial shoulder joint: Secondary | ICD-10-CM | POA: Diagnosis not present

## 2024-03-07 DIAGNOSIS — D649 Anemia, unspecified: Secondary | ICD-10-CM | POA: Diagnosis not present

## 2024-03-07 DIAGNOSIS — E119 Type 2 diabetes mellitus without complications: Secondary | ICD-10-CM | POA: Diagnosis not present

## 2024-03-07 LAB — CBC WITH DIFFERENTIAL/PLATELET
Abs Immature Granulocytes: 0.05 10*3/uL (ref 0.00–0.07)
Basophils Absolute: 0 10*3/uL (ref 0.0–0.1)
Basophils Relative: 0 %
Eosinophils Absolute: 0 10*3/uL (ref 0.0–0.5)
Eosinophils Relative: 0 %
HCT: 29 % — ABNORMAL LOW (ref 36.0–46.0)
Hemoglobin: 8.4 g/dL — ABNORMAL LOW (ref 12.0–15.0)
Immature Granulocytes: 1 %
Lymphocytes Relative: 26 %
Lymphs Abs: 1.8 10*3/uL (ref 0.7–4.0)
MCH: 22.1 pg — ABNORMAL LOW (ref 26.0–34.0)
MCHC: 29 g/dL — ABNORMAL LOW (ref 30.0–36.0)
MCV: 76.3 fL — ABNORMAL LOW (ref 80.0–100.0)
Monocytes Absolute: 0.8 10*3/uL (ref 0.1–1.0)
Monocytes Relative: 11 %
Neutro Abs: 4.4 10*3/uL (ref 1.7–7.7)
Neutrophils Relative %: 62 %
Platelets: 433 10*3/uL — ABNORMAL HIGH (ref 150–400)
RBC: 3.8 MIL/uL — ABNORMAL LOW (ref 3.87–5.11)
RDW: 20.8 % — ABNORMAL HIGH (ref 11.5–15.5)
WBC: 7.1 10*3/uL (ref 4.0–10.5)
nRBC: 0.3 % — ABNORMAL HIGH (ref 0.0–0.2)

## 2024-03-07 LAB — BASIC METABOLIC PANEL WITH GFR
Anion gap: 7 (ref 5–15)
BUN: 23 mg/dL (ref 8–23)
CO2: 25 mmol/L (ref 22–32)
Calcium: 8.8 mg/dL — ABNORMAL LOW (ref 8.9–10.3)
Chloride: 102 mmol/L (ref 98–111)
Creatinine, Ser: 1.07 mg/dL — ABNORMAL HIGH (ref 0.44–1.00)
GFR, Estimated: 57 mL/min — ABNORMAL LOW (ref >60)
Glucose, Bld: 171 mg/dL — ABNORMAL HIGH (ref 70–99)
Potassium: 4.5 mmol/L (ref 3.5–5.1)
Sodium: 134 mmol/L — ABNORMAL LOW (ref 135–145)

## 2024-03-07 LAB — URINALYSIS, ROUTINE W REFLEX MICROSCOPIC
Bilirubin Urine: NEGATIVE
Glucose, UA: NEGATIVE mg/dL
Hgb urine dipstick: NEGATIVE
Ketones, ur: NEGATIVE mg/dL
Leukocytes,Ua: NEGATIVE
Nitrite: NEGATIVE
Protein, ur: NEGATIVE mg/dL
Specific Gravity, Urine: 1.008 (ref 1.005–1.030)
pH: 6 (ref 5.0–8.0)

## 2024-03-07 LAB — GLUCOSE, CAPILLARY
Glucose-Capillary: 157 mg/dL — ABNORMAL HIGH (ref 70–99)
Glucose-Capillary: 161 mg/dL — ABNORMAL HIGH (ref 70–99)

## 2024-03-07 MED ORDER — ONDANSETRON HCL 4 MG PO TABS
4.0000 mg | ORAL_TABLET | Freq: Four times a day (QID) | ORAL | 0 refills | Status: DC | PRN
Start: 2024-03-07 — End: 2024-06-08

## 2024-03-07 NOTE — Progress Notes (Signed)
     Carrie [name] is a 68 y.o. female   Orthopaedic diagnosis:Status post left reverse total shoulder arthroplasty with repair of tuberosity fractures 03/06/2024  Subjective: Patient resting comfortably.  Pain is controlled.  She is regaining some digital motion in her hand after nerve block.  Sling in place.  Status post transfusions for anemia. Hemoglobin 8.4 this morning. She says she has help at home with her daughter and daughter-in-law.   Objectyive: Vitals:   03/07/24 0748 03/07/24 0814  BP: (!) 152/76   Pulse: 96   Resp: 20   Temp: 98.3 F (36.8 C)   SpO2: 98% 97%     Exam: Awake and alert Respirations even and unlabored No acute distress  Left shoulder with sling in place.  Surgical dressing clean, dry, and intact.  Swelling appropriate.  She has intact digital motion and grip strength.  Palpable radial pulse.  Did not assess for shoulder range of motion.  His skin appears benign.  Assessment: Postop day 1 status post the above, doing well   Plan: Patient suitable for discharge once cleared medically. -Keep sling in place.  Nonweightbearing to left upper extremity. - Okay for range of motion of the hand, wrist, and elbow.  No range of motion of the operative shoulder -Oxycodone  for pain, aspirin  for DVT prophylaxis  Outpatient follow-up with Dr. Deeann Fare 03/20/2024 for wound check and x-rays.   Carrie [name] J. [place], PA-C

## 2024-03-07 NOTE — Discharge Summary (Signed)
 [facility] Physician Discharge Summary   Patient name: Carrie [name]  Admit date:     03/05/2024  Discharge date: 03/07/2024  Attending Physician: Mandy Second [[id number]]  Discharge Physician: Unk Garb   PCP: Carrie Maxim, MD  Admitted From: Home  Disposition:  Home  Recommendations for Outpatient Follow-up:  Follow up with PCP in 1-2 weeks Follow up with ortho as scheduled  Home Health:No Equipment/Devices: None  Discharge Condition:Stable CODE STATUS:FULL Diet recommendation: Heart Healthy/Diabetic Fluid Restriction: None  Hospital Summary: HPI: Carrie [name] is a 68 y.o. female with medical history significant of anemia of chronic disease, hypertension, type 2 diabetes, history of CVA, stage IIIa CKD, psoriatic arthritis, osteoporosis, moderate persistent asthma, hyperlipidemia, GERD, anxiety and depression presenting to the ED for low hemoglobin.   Patient reports having sustained a mechanical fall this past Saturday after tripping on her dog collar and falling onto her left shoulder.  She is scheduled for left total shoulder arthroplasty tomorrow morning.  She underwent preop testing today and was noted to have a hemoglobin of 6.5 on CBC this afternoon.  She was ultimately directly admitted into the hospital for medical optimization prior to shoulder arthroplasty tomorrow.   Patient reports that she has been feeling generally well aside from pain in her left shoulder.  She denies any hematochezia, melena, hematemesis, hematuria.  She does endorse chronic mild shortness of breath with exertion secondary to her underlying asthma but has not noted any worsening.  She does endorse mildly increased fatigue in her day-to-day activities but this is not something that is new for her.  She otherwise denies any fevers, chills, nausea, vomiting, chest pain, palpitations, cough, abdominal pain, urinary changes.   Preop testing with CBC showing hemoglobin 6.5, MCV  71.1, reactive thrombocytosis.  Preop CMP showing glucose 163, creatinine 1.31 (around her baseline), albumin 2.9.  Initial vital signs on arrival with mild tachycardia and mildly elevated blood pressure.  Significant Events: Admitted 03/05/2024 for acute on chronic microcytic anemia   Admission Labs: WBC 8.0, HgB 6.5, plt 470 Na 135, K 5.0, BUN 26, Scr 1.31, glu 163 AST 12, ALT 8, alk phos 68  Admission Imaging Studies: CXR No active cardiopulmonary disease   Significant Labs:   Significant Imaging Studies:   Antibiotic Therapy: Anti-infectives (From admission, onward)    Start     Dose/Rate Route Frequency Ordered Stop   03/06/24 0645  ceFAZolin  (ANCEF ) IVPB 2g/100 mL premix        2 g 200 mL/hr over 30 Minutes Intravenous On call to O.R. 03/06/24 0643 03/07/24 0559       Procedures:   Consultants:    Hospital Course by Problem: * Acute on chronic anemia 03-06-2024 s/p 2 units PRBC. Post-transfusion HgB of 7.4 g/dl.  Pt lost about 250 ml of blood. Will repeat HgB at 6 pm and transfuse as needed. Pt will need PCP to get her outpatient iron infusions. She is iron deficient.  Pt had EGD with [facility] 03-2023 that was negative. Colonoscopy by [facility] in 11/27/18 that showed some polyps. Pt is due for repeat colonoscopy. Pt's PCP should refer patient back to GI for further workup of her iron deficiency anemia.  Iron/TIBC/Ferritin/ %Sat    Component Value Date/Time   IRON 19 (L) 03/05/2024 1957   TIBC 351 03/05/2024 1957   FERRITIN 28 03/05/2024 1957   IRONPCTSAT 5 (L) 03/05/2024 1957   IRONPCTSAT 12 (L) 05/05/2010 1128   03-07-2024 Pt given 1 additional  unit of PRBC last night due to 250 ml recorded EBL during surgery yesterday.   HgB up to 8.4 g/dl today. Discussed with pt that she will need to complete colonoscopy for iron deficiency anemia workup.   Pt states she had colonoscopy scheduled for last month but canceled it due to her brother having surgery. Pt  advised to complete colonoscopy soon.  PCP will need to refer pt for outpatient iron infusions.  S/P reverse total shoulder arthroplasty, left 03-06-2024 had surgery today. Ortho responsible for their own f/u appointments and opiate Rx.  03-07-2024 pt awaiting PT evaluation. Stable for DC otherwise. Had family at home to help.  Stage 3a chronic kidney disease (CKD) (HCC) 03-06-2024 monitor Scr with blood loss.  03-07-2024 stable.   DM2 (diabetes mellitus, type 2) (HCC) 03-06-2024 continue SSI. Hold metformin .  03-07-2024 stable.   Benign essential HTN 03-06-2024 hold ARB for now. Continue cardizem . Stop norvasc  as they are both calcium  channel blockers.  03-07-2024 stable.    Discharge Diagnoses:  Principal Problem:   Acute on chronic anemia Active Problems:   S/P reverse total shoulder arthroplasty, left   Benign essential HTN   DM2 (diabetes mellitus, type 2) (HCC)   Stage 3a chronic kidney disease (CKD) (HCC)   Discharge Instructions  Discharge Instructions     Call MD for:  extreme fatigue   Complete by: As directed    Call MD for:  hives   Complete by: As directed    Call MD for:  persistant dizziness or light-headedness   Complete by: As directed    Call MD for:  persistant nausea and vomiting   Complete by: As directed    Call MD for:  temperature >100.4   Complete by: As directed    Diet - low sodium heart healthy   Complete by: As directed    Diet Carb Modified   Complete by: As directed    Discharge instructions   Complete by: As directed    1. Follow up with your primary care provider in 1-2 weeks following discharge from hospital. 2. Follow up with orthopedics as scheduled.   Increase activity slowly   Complete by: As directed       Allergies as of 03/07/2024       Reactions   Lisinopril Cough        Medication List     STOP taking these medications    oxyCODONE -acetaminophen  5-325 MG tablet Commonly known as: PERCOCET/ROXICET        TAKE these medications    Accu-Chek Guide Me w/Device Kit USE AS INSTRUCTED TO CHECK BLOOD SUGAR 2 TIMES DAILY DX E11.9   Accu-Chek Guide Test test strip Generic drug: glucose blood 1 STRIP BY IN VITRO ROUTE 2 (TWO) TIMES DAILY. ACCU CHEK GUIDE TEST STRIPS   Accu-Chek Softclix Lancets lancets Use as instructed to check blood sugar 1-2 times daily Dx E11.9   acetaminophen  500 MG tablet Commonly known as: TYLENOL  Take 1,000 mg by mouth every 6 (six) hours as needed for moderate pain (pain score 4-6).   albuterol  108 (90 Base) MCG/ACT inhaler Commonly known as: VENTOLIN  HFA Inhale 2 puffs into the lungs every 4 (four) hours as needed for wheezing or shortness of breath (or coughing).   amLODipine  2.5 MG tablet Commonly known as: NORVASC  Take 2.5 mg by mouth daily.   aspirin  EC 81 MG tablet Take 1 tablet (81 mg total) by mouth 2 (two) times daily. Swallow whole. What changed: when to take  this   busPIRone  5 MG tablet Commonly known as: BUSPAR  Take 10 mg by mouth at bedtime.   CALCIUM  CARBONATE-VITAMIN D3 PO Take 1 tablet by mouth daily.   chlorhexidine  4 % external liquid Commonly known as: HIBICLENS  Apply 15 mLs (1 Application total) topically as directed for 30 doses. Use as directed daily for 5 days every other week for 6 weeks.   clotrimazole-betamethasone cream Commonly known as: LOTRISONE Apply 1 Application topically 2 (two) times daily as needed (rash).   denosumab  60 MG/ML Sosy injection Commonly known as: PROLIA  Inject 60 mg into the skin every 6 (six) months.   diltiazem  240 MG 24 hr capsule Commonly known as: CARDIZEM  CD Take 240 mg by mouth daily.   DULoxetine  60 MG capsule Commonly known as: CYMBALTA  Take 60 mg by mouth daily.   ferrous sulfate  325 (65 FE) MG EC tablet Take 325 mg by mouth daily.   losartan  100 MG tablet Commonly known as: COZAAR  TAKE 1 TABLET BY MOUTH EVERY DAY IN THE MORNING   Melatonin 12 MG Tabs Take 12 mg by mouth  at bedtime as needed (sleep).   metFORMIN  500 MG 24 hr tablet Commonly known as: GLUCOPHAGE -XR Take 1 tablet (500 mg total) by mouth at bedtime. What changed: when to take this   mirabegron ER 25 MG Tb24 tablet Commonly known as: MYRBETRIQ Take 25 mg by mouth daily.   mupirocin ointment 2 % Commonly known as: BACTROBAN Place 1 Application into the nose 2 (two) times daily for 60 doses. Use as directed 2 times daily for 5 days every other week for 6 weeks.   omeprazole 40 MG capsule Commonly known as: PRILOSEC Take 40 mg by mouth 2 (two) times daily.   ondansetron  4 MG tablet Commonly known as: ZOFRAN  Take 1 tablet (4 mg total) by mouth every 6 (six) hours as needed for nausea.   oxyCODONE  5 MG immediate release tablet Commonly known as: Roxicodone  Take 1-2 tablets (5-10 mg total) by mouth every 4 (four) hours as needed.   Ozempic  (1 MG/DOSE) 4 MG/3ML Sopn Generic drug: Semaglutide  (1 MG/DOSE) Inject 1 mg into the skin once a week.   pravastatin  80 MG tablet Commonly known as: PRAVACHOL  Take 80 mg by mouth daily.   primidone  250 MG tablet Commonly known as: MYSOLINE  Take 125 mg by mouth 2 (two) times daily.   Qvar RediHaler 80 MCG/ACT inhaler Generic drug: beclomethasone Inhale 1 puff into the lungs 2 (two) times daily.   Stelara 45 MG/0.5ML injection Generic drug: ustekinumab Inject 45 mg into the skin every 3 (three) months.        Follow-up Information     [place], [facility], PA-C. Schedule an appointment as soon as possible for a visit on 03/20/2024.   Specialty: Orthopedic Surgery Contact information: [address] [place] [address] [phone]                Allergies  Allergen Reactions   Lisinopril Cough    Discharge Exam: Vitals:   03/07/24 0814 03/07/24 1125  BP:  (!) 148/89  Pulse:  94  Resp:    Temp:    SpO2: 97%     Physical Exam Vitals and nursing note reviewed.  Constitutional:      General: She is not in  acute distress.    Appearance: She is obese. She is not toxic-appearing.  HENT:     Head: Normocephalic and atraumatic.     Nose: Nose normal.  Cardiovascular:  Rate and Rhythm: Normal rate and regular rhythm.     Pulses: Normal pulses.  Pulmonary:     Effort: Pulmonary effort is normal.     Breath sounds: Normal breath sounds.  Abdominal:     General: Bowel sounds are normal. There is no distension.     Palpations: Abdomen is soft.  Musculoskeletal:     Comments: Has cooling pad on left shoulder. Left shoulder in sling.  Skin:    Capillary Refill: Capillary refill takes less than 2 seconds.  Neurological:     Mental Status: She is alert and oriented to person, place, and time.     The results of significant diagnostics from this hospitalization (including imaging, microbiology, ancillary and laboratory) are listed below for reference.    Microbiology: Recent Results (from the past 240 hours)  Surgical pcr screen     Status: Abnormal   Collection Time: 03/05/24  3:04 PM   Specimen: Nasal Mucosa; Nasal Swab  Result Value Ref Range Status   MRSA, PCR NEGATIVE NEGATIVE Final   Staphylococcus aureus POSITIVE (A) NEGATIVE Final    Comment: (NOTE) The Xpert SA Assay (FDA approved for NASAL specimens in patients 73 years of age and older), is one component of a comprehensive surveillance program. It is not intended to diagnose infection nor to guide or monitor treatment. Performed at [facility], [address], [place], [place] [address]     Labs: Basic Metabolic Panel: Recent Labs  Lab 03/05/24 1348 03/06/24 0521 03/06/24 0704 03/07/24 0649  NA 135 134* 136 134*  K 5.0 4.6 5.0 4.5  CL 105 105 106 102  CO2 22 22  --  25  GLUCOSE 163* 182* 173* 171*  BUN 26* 18 19 23   CREATININE 1.31* 1.00 1.10* 1.07*  CALCIUM  8.8* 8.6*  --  8.8*   Liver Function Tests: Recent Labs  Lab 03/05/24 1348  AST 12*  ALT 8  ALKPHOS 68  BILITOT 0.4  PROT 7.9   ALBUMIN 2.9*   CBC: Recent Labs  Lab 03/05/24 1348 03/06/24 0700 03/06/24 0704 03/06/24 0945 03/06/24 1830 03/07/24 0649  WBC 8.0 7.4  --   --   --  7.1  NEUTROABS  --   --   --   --   --  4.4  HGB 6.5* 7.3* 8.2* 7.4* 7.6* 8.4*  HCT 22.4* 25.1* 24.0* 24.9* 26.1* 29.0*  MCV 71.1* 72.5*  --   --   --  76.3*  PLT 470* 449*  --   --   --  433*   CBG: Recent Labs  Lab 03/06/24 1203 03/06/24 1629 03/06/24 2053 03/07/24 0734 03/07/24 1156  GLUCAP 181* 333* 194* 157* 161*   Anemia work up Recent Labs    03/05/24 1957 03/05/24 2151  VITAMINB12 501  --   FOLATE  --  6.0  FERRITIN 28  --   TIBC 351  --   IRON 19*  --    Urinalysis    Component Value Date/Time   COLORURINE STRAW (A) 03/07/2024 1239   APPEARANCEUR CLEAR 03/07/2024 1239   LABSPEC 1.008 03/07/2024 1239   PHURINE 6.0 03/07/2024 1239   GLUCOSEU NEGATIVE 03/07/2024 1239   HGBUR NEGATIVE 03/07/2024 1239   BILIRUBINUR NEGATIVE 03/07/2024 1239   KETONESUR NEGATIVE 03/07/2024 1239   PROTEINUR NEGATIVE 03/07/2024 1239   UROBILINOGEN 0.2 06/25/2015 1600   NITRITE NEGATIVE 03/07/2024 1239   LEUKOCYTESUR NEGATIVE 03/07/2024 1239   Sepsis Labs Recent Labs  Lab 03/05/24 1348  03/06/24 0700 03/07/24 0649  WBC 8.0 7.4 7.1    Procedures/Studies: DG Chest 2 View Result Date: 03/05/2024 CLINICAL DATA:  Preop exam.  Recent cough, now resolved. EXAM: CHEST - 2 VIEW COMPARISON:  Chest radiograph 05/15/2023 FINDINGS: The cardiomediastinal contours are normal. The lungs are clear. Pulmonary vasculature is normal. No consolidation, pleural effusion, or pneumothorax. Left proximal humeral fracture was characterized on recent imaging. Kyphoplasty within a vertebra at the thoracolumbar junction. IMPRESSION: No active cardiopulmonary disease. Electronically Signed   By: Chadwick Colonel M.D.   On: 03/05/2024 15:53   CT SHOULDER LEFT WO CONTRAST Result Date: 03/03/2024 CLINICAL DATA:  Left proximal humeral fracture EXAM: CT  OF THE UPPER LEFT EXTREMITY WITHOUT CONTRAST TECHNIQUE: Multidetector CT imaging of the upper left extremity was performed according to the standard protocol. RADIATION DOSE REDUCTION: This exam was performed according to the departmental dose-optimization program which includes automated exposure control, adjustment of the mA and/or kV according to patient size and/or use of iterative reconstruction technique. COMPARISON:  Radiographs 02/29/2024 FINDINGS: Bones/Joint/Cartilage Surgical neck fracture left proximal humerus with shaft 2.1 cm lateral/proximal to the humeral head fragment. Apex anterior angulation between the major fracture fragments. Moderate comminution within intermediary fragment anteriorly extending obliquely into the left humeral metadiaphysis. This anterior fracture extends up towards the lesser tuberosity but a separate lesser tuberosity fragment is not well seen. There is also some mild comminution laterally along the surgical neck fracture site as on image 51 series 6. No clavicular or scapular fracture is identified. Old healed left rib fractures. Subacromial morphology is type 2 (curved). Greater tuberosity fracture displaced only 0.4 cm on image 50 series 6. Ligaments Suboptimally assessed by CT. Muscles and Tendons Indistinctness of tissue planes and local hematoma tracking along musculature adjacent to the fracture, most notably along the biceps. Soft tissues Coronary, aortic arch, and branch vessel atherosclerotic vascular disease. IMPRESSION: 1. Surgical neck fracture left proximal humerus with shaft 2.1 cm lateral/proximal to the humeral head fragment. Apex anterior angulation between the major fracture fragments. Moderate comminution within the anterior fracture fragment extending obliquely into the left humeral metadiaphysis. This anterior fracture extends up towards the lesser tuberosity but a separate lesser tuberosity fragment is not well seen. There is also some mild comminution  laterally along the surgical neck fracture site. 2. Greater tuberosity fracture displaced only 0.4 cm. 3. Although technically a Neer 2 part fracture, the comminution with intermediary fragments along the humeral metadiaphysis may indicate greater complexity than is typical. 4. Indistinctness of tissue planes and local hematoma tracking along musculature adjacent to the fracture, most notably along the biceps. 5. Old healed left rib fractures. 6.  Aortic Atherosclerosis (ICD10-I70.0). Electronically Signed   By: Freida Jes M.D.   On: 03/03/2024 16:59   DG Shoulder Left Result Date: 02/29/2024 CLINICAL DATA:  fall, acute shoulder pain EXAM: LEFT SHOULDER - 2+ VIEW COMPARISON:  05/15/2023 FINDINGS: There is an acute displaced proximal left humerus surgical neck fracture. No associated shoulder joint malalignment. AC joint degenerative change noted without separation. Included left chest unremarkable. IMPRESSION: Acute displaced proximal left humerus surgical neck fracture. Electronically Signed   By: Melven Stable.  Shick M.D.   On: 02/29/2024 14:41    Time coordinating discharge: 60 mins  SIGNED:  Unk Garb, DO [facility] 03/07/24, 1:40 PM

## 2024-03-07 NOTE — Evaluation (Signed)
 Occupational Therapy Evaluation Patient Details Name: Carrie [name] MRN: [id number] DOB: August 08, 1956 Today's Date: 03/07/2024   History of Present Illness   Patient had a fall onto L shoulder over dog collar.patient was found to have Left shoulder displaced comminuted proximal humerus fracture. Patient was noted to have log hgb in pre op labs and was sent to hospital. Patient underwent Left shoulder reverse total shoulder arthroplasty and ORIF left shoulder tuberosity fractures on 5/23. PMH: L THA, bil TKAs, anemia of chronic disease, hypertension, type 2 diabetes, CVA, stage IIIa CKD, psoriatic arthritis, osteoporosis, moderate persistent asthma, hyperlipidemia, GERD, anxiety and depression     Clinical Impressions s/p shoulder replacement without functional use of LUE secondary to effects of surgery and interscalene block and shoulder precautions. Therapist provided education and instruction to patient  in regards to exercises, precautions, positioning, donning upper extremity clothing and bathing while maintaining shoulder precautions, ice and edema management, use of ice machine and donning/doffing sling. Patient verbalized understanding and demonstrated as needed. Patient reported plan was to d/c home with family support when medically stable. OT to continue to follow.      If plan is discharge home, recommend the following:   A little help with walking and/or transfers;A little help with bathing/dressing/bathroom;Assistance with cooking/housework;Direct supervision/assist for medications management;Assist for transportation;Help with stairs or ramp for entrance;Direct supervision/assist for financial management     Functional Status Assessment   Patient has had a recent decline in their functional status and demonstrates the ability to make significant improvements in function in a reasonable and predictable amount of time.     Equipment Recommendations   None recommended by  OT      Precautions/Restrictions   Precautions Precautions: Shoulder Type of Shoulder Precautions: no ROM shoulder, ok hand wrist elbow. Shoulder Interventions: At all times;Shoulder sling/immobilizer;Off for dressing/bathing/exercises Precaution Booklet Issued: Yes (comment) Recall of Precautions/Restrictions: Impaired Required Braces or Orthoses: Sling Restrictions Weight Bearing Restrictions Per Provider Order: Yes LUE Weight Bearing Per Provider Order: Non weight bearing     Mobility Bed Mobility Overal bed mobility: Needs Assistance Bed Mobility: Supine to Sit     Supine to sit: HOB elevated, Contact guard     General bed mobility comments: pt utilized bed positioning to self assist return to bed        Balance Overall balance assessment: Needs assistance   Sitting balance-Leahy Scale: Good     Standing balance support: Single extremity supported, During functional activity Standing balance-Leahy Scale: Fair                             ADL either performed or assessed with clinical judgement   ADL Overall ADL's : Needs assistance/impaired Eating/Feeding: Sitting;Set up   Grooming: Sitting;Minimal assistance   Upper Body Bathing: Sitting;Maximal assistance   Lower Body Bathing: Maximal assistance;Sit to/from stand   Upper Body Dressing : Sitting;Maximal assistance Upper Body Dressing Details (indicate cue type and reason): ed on how to keep shoulder from moving with dressing Lower Body Dressing: Sit to/from stand;Maximal assistance   Toilet Transfer: Minimal assistance Toilet Transfer Details (indicate cue type and reason): no AD reporting LLE stiffer today. Toileting- Clothing Manipulation and Hygiene: Contact guard assist;Sit to/from stand               Vision Patient Visual Report: No change from baseline              Pertinent Vitals/Pain Pain Assessment Pain Assessment:  No/denies pain (block still in place)      Extremity/Trunk Assessment Upper Extremity Assessment Upper Extremity Assessment: LUE deficits/detail LUE Deficits / Details: shoulder surgery on 5/23. tingling in fingers able to wiggle digits but no control wrist at this time LUE Coordination: decreased fine motor;decreased gross motor   Lower Extremity Assessment Lower Extremity Assessment: Overall WFL for tasks assessed   Cervical / Trunk Assessment Cervical / Trunk Assessment: Normal   Communication Communication Communication: No apparent difficulties   Cognition Arousal: Alert Behavior During Therapy: WFL for tasks assessed/performed Cognition: No apparent impairments                               Following commands: Intact                  Home Living Family/patient expects to be discharged to:: Private residence Living Arrangements: Spouse/significant other Available Help at Discharge: Family;Available PRN/intermittently Type of Home: House Home Access: Stairs to enter [employer] of Steps: 3 Entrance Stairs-Rails: Left;Right Home Layout: One level     Bathroom Shower/Tub: Tub/shower unit         Home Equipment: Agricultural consultant (2 wheels);Cane - single Acupuncturist (4 wheels)   Additional Comments: patient reported that she lives with boyfriend but he works during the day. daughter in law was able to come help if needed when she goes home      Prior Functioning/Environment Prior Level of Function : Independent/Modified Independent               ADLs Comments: has dog at home who she takes for walks some.    OT Problem List: Decreased activity tolerance;Impaired balance (sitting and/or standing);Decreased coordination;Decreased safety awareness;Decreased knowledge of precautions;Impaired UE functional use;Pain;Decreased knowledge of use of DME or AE   OT Treatment/Interventions:        OT Goals(Current goals can be found in the care plan  section)   Acute Rehab OT Goals Patient Stated Goal: to get back home with pets. OT Goal Formulation: With patient Time For Goal Achievement: 03/21/24 Potential to Achieve Goals: Fair   OT Frequency:          AM-PAC OT "6 Clicks" Daily Activity     Outcome Measure Help from another person eating meals?: A Little Help from another person taking care of personal grooming?: A Little Help from another person toileting, which includes using toliet, bedpan, or urinal?: A Little Help from another person bathing (including washing, rinsing, drying)?: A Lot Help from another person to put on and taking off regular upper body clothing?: A Lot Help from another person to put on and taking off regular lower body clothing?: A Lot 6 Click Score: 15   End of Session Equipment Utilized During Treatment: Gait belt;Other (comment) (sling) Nurse Communication: Mobility status;Precautions  Activity Tolerance: Patient tolerated treatment well Patient left: in chair;with call bell/phone within reach;with chair alarm set  OT Visit Diagnosis: Unsteadiness on feet (R26.81);Other abnormalities of gait and mobility (R26.89);Pain                Time: 1610-9604 OT Time Calculation (min): 27 min Charges:  OT General Charges $OT Visit: 1 Visit OT Evaluation $OT Eval Low Complexity: 1 Low OT Treatments $Self Care/Home Management : 8-22 mins  Wynette Heckler, MS Acute Rehabilitation Department Office# [phone]   Jame Maze 03/07/2024, 12:44 PM

## 2024-03-07 NOTE — Evaluation (Signed)
 Physical Therapy Evaluation Patient Details Name: Carrie [name] MRN: [id number] DOB: 1956-04-01 Today's Date: 03/07/2024  History of Present Illness  Patient had a fall onto L shoulder over dog collar.patient was found to have Left shoulder displaced comminuted proximal humerus fracture. Patient was noted to have log hgb in pre op labs and was sent to hospital. Patient underwent Left shoulder reverse total shoulder arthroplasty and ORIF left shoulder tuberosity fractures on 5/23. PMH: L THA, bil TKAs, anemia of chronic disease, hypertension, type 2 diabetes, CVA, stage IIIa CKD, psoriatic arthritis, osteoporosis, moderate persistent asthma, hyperlipidemia, GERD, anxiety and depression  Clinical Impression  Patient is s/p above surgery resulting in functional limitations due to the deficits listed below (see PT Problem List).  Patient will benefit from acute skilled PT to increase their independence and safety with mobility to facilitate discharge.  Pt reports her balance was poor prior to this admission.  Pt utilized [facility] [address] for more support with mobilizing today and able to ambulate in hallway and use bathroom prior to return to bed.  Pt plans to return home with family support at d/c (which she anticipates d/c later today).         If plan is discharge home, recommend the following: A little help with walking and/or transfers;A little help with bathing/dressing/bathroom;Assistance with cooking/housework;Assist for transportation;Help with stairs or ramp for entrance   Can travel by private vehicle        Equipment Recommendations None recommended by PT  Recommendations for Other Services       Functional Status Assessment Patient has had a recent decline in their functional status and demonstrates the ability to make significant improvements in function in a reasonable and predictable amount of time.     Precautions / Restrictions Precautions Precautions: Shoulder Type of Shoulder  Precautions: no ROM shoulder, ok hand wrist elbow. Shoulder Interventions: At all times;Shoulder sling/immobilizer;Off for dressing/bathing/exercises Precaution Booklet Issued: Yes (comment) Recall of Precautions/Restrictions: Impaired Required Braces or Orthoses: Sling Restrictions Weight Bearing Restrictions Per Provider Order: Yes LUE Weight Bearing Per Provider Order: Non weight bearing Other Position/Activity Restrictions: L UE NWB      Mobility  Bed Mobility Overal bed mobility: Needs Assistance (Simultaneous filing. User may not have seen previous data.) Bed Mobility: Supine to Sit (Simultaneous filing. User may not have seen previous data.)     Supine to sit: HOB elevated, Contact guard (Simultaneous filing. User may not have seen previous data.)     General bed mobility comments: pt utilized bed positioning to self assist return to bed (Simultaneous filing. User may not have seen previous data.)    Transfers Overall transfer level: Needs assistance Equipment used: Straight cane Transfers: Sit to/from Stand Sit to Stand: Contact guard assist           General transfer comment: pt appropriately utilizes right UE to self assist    Ambulation/Gait Ambulation/Gait assistance: Contact guard assist Gait Distance (Feet): 200 Feet   Gait Pattern/deviations: Step-through pattern, Decreased stride length, Ataxic Gait velocity: decr     General Gait Details: left leg a little "stiff"; pt reports poor balance at baseline, utilized cane for better support, no true LOB or physical assist required however CGA provided as pt appears unsteady  Stairs Stairs:  (verbally reviewed safe technique)          Wheelchair Mobility     Tilt Bed    Modified Rankin (Stroke Patients Only)       Balance Overall balance assessment: Needs assistance  Sitting balance-Leahy Scale: Good       Standing balance-Leahy Scale: Fair Standing balance comment: static fair,  dynamic requires one UE support                             Pertinent Vitals/Pain Pain Assessment Pain Assessment: No/denies pain    Home Living Family/patient expects to be discharged to:: Private residence Living Arrangements: Spouse/significant other Available Help at Discharge: Family;Available PRN/intermittently Type of Home: House Home Access: Stairs to enter Entrance Stairs-Rails: Lawyer of Steps: 3   Home Layout: One level Home Equipment: Agricultural consultant (2 wheels);Cane - single Acupuncturist (4 wheels) Additional Comments: patient reported that she lives with boyfriend but he works during the day. daughter in law was able to come help if needed when she goes home    Prior Function Prior Level of Function : Independent/Modified Independent               ADLs Comments: has dog at home who she takes for walks some.     Extremity/Trunk Assessment   Upper Extremity Assessment Upper Extremity Assessment: LUE deficits/detail LUE Deficits / Details: shoulder surgery on 5/23. tingling in fingers able to wiggle digits but no control wrist at this time LUE Coordination: decreased fine motor;decreased gross motor    Lower Extremity Assessment Lower Extremity Assessment: Overall WFL for tasks assessed    Cervical / Trunk Assessment Cervical / Trunk Assessment: Normal  Communication   Communication Communication: No apparent difficulties    Cognition Arousal: Alert Behavior During Therapy: WFL for tasks assessed/performed   PT - Cognitive impairments: No apparent impairments                         Following commands: Intact       Cueing       General Comments      Exercises     Assessment/Plan    PT Assessment All further PT needs can be met in the next venue of care  PT Problem List Decreased strength;Decreased range of motion;Decreased knowledge of use of DME;Decreased  mobility;Decreased balance       PT Treatment Interventions      PT Goals (Current goals can be found in the Care Plan section)  Acute Rehab PT Goals PT Goal Formulation: With patient Time For Goal Achievement: 03/14/24 Potential to Achieve Goals: Good    Frequency       Co-evaluation               AM-PAC PT "6 Clicks" Mobility  Outcome Measure Help needed turning from your back to your side while in a flat bed without using bedrails?: A Little Help needed moving from lying on your back to sitting on the side of a flat bed without using bedrails?: A Little Help needed moving to and from a bed to a chair (including a wheelchair)?: A Little Help needed standing up from a chair using your arms (e.g., wheelchair or bedside chair)?: A Little Help needed to walk in hospital room?: A Little Help needed climbing 3-5 steps with a railing? : A Little 6 Click Score: 18    End of Session Equipment Utilized During Treatment: Gait belt Activity Tolerance: Patient tolerated treatment well Patient left: in bed;with call bell/phone within reach Nurse Communication: Mobility status PT Visit Diagnosis: Unsteadiness on feet (R26.81)    Time: 1610-9604 PT Time Calculation (min) (ACUTE  ONLY): 13 min   Charges:   PT Evaluation $PT Eval Low Complexity: 1 Low   PT General Charges $$ ACUTE PT VISIT: 1 Visit        Kati PT, DPT Physical Therapist Acute Rehabilitation Services Office: [phone]   Myna Asal Payson 03/07/2024, 12:56 PM

## 2024-03-07 NOTE — Progress Notes (Signed)
 PROGRESS NOTE    Carrie [name]  ZOX:[id number] DOB: October 12, 1956 DOA: 03/05/2024 PCP: Jacqulyne Maxim, MD  Subjective: Pt seen and examined.  Pt given 1 additional unit of PRBC last night due to 250 ml recorded EBL during surgery yesterday.  HgB up to 8.4 g/dl today. Discussed with pt that she will need to complete colonoscopy for iron deficiency anemia workup.  Pt states she had colonoscopy scheduled for last month but canceled it due to her brother having surgery. Pt advised to complete colonoscopy soon.   Hospital Course: HPI: Carrie [name] is a 68 y.o. female with medical history significant of anemia of chronic disease, hypertension, type 2 diabetes, history of CVA, stage IIIa CKD, psoriatic arthritis, osteoporosis, moderate persistent asthma, hyperlipidemia, GERD, anxiety and depression presenting to the ED for low hemoglobin.   Patient reports having sustained a mechanical fall this past Saturday after tripping on her dog collar and falling onto her left shoulder.  She is scheduled for left total shoulder arthroplasty tomorrow morning.  She underwent preop testing today and was noted to have a hemoglobin of 6.5 on CBC this afternoon.  She was ultimately directly admitted into the hospital for medical optimization prior to shoulder arthroplasty tomorrow.   Patient reports that she has been feeling generally well aside from pain in her left shoulder.  She denies any hematochezia, melena, hematemesis, hematuria.  She does endorse chronic mild shortness of breath with exertion secondary to her underlying asthma but has not noted any worsening.  She does endorse mildly increased fatigue in her day-to-day activities but this is not something that is new for her.  She otherwise denies any fevers, chills, nausea, vomiting, chest pain, palpitations, cough, abdominal pain, urinary changes.   Preop testing with CBC showing hemoglobin 6.5, MCV 71.1, reactive thrombocytosis.  Preop CMP showing  glucose 163, creatinine 1.31 (around her baseline), albumin 2.9.  Initial vital signs on arrival with mild tachycardia and mildly elevated blood pressure.  Significant Events: Admitted 03/05/2024 for acute on chronic microcytic anemia   Admission Labs: WBC 8.0, HgB 6.5, plt 470 Na 135, K 5.0, BUN 26, Scr 1.31, glu 163 AST 12, ALT 8, alk phos 68  Admission Imaging Studies: CXR No active cardiopulmonary disease   Significant Labs:   Significant Imaging Studies:   Antibiotic Therapy: Anti-infectives (From admission, onward)    Start     Dose/Rate Route Frequency Ordered Stop   03/06/24 0645  ceFAZolin  (ANCEF ) IVPB 2g/100 mL premix        2 g 200 mL/hr over 30 Minutes Intravenous On call to O.R. 03/06/24 0643 03/07/24 0559       Procedures:   Consultants:     Assessment and Plan: * Acute on chronic anemia 03-06-2024 s/p 2 units PRBC. Post-transfusion HgB of 7.4 g/dl.  Pt lost about 250 ml of blood. Will repeat HgB at 6 pm and transfuse as needed. Pt will need PCP to get her outpatient iron infusions. She is iron deficient.  Pt had EGD with [facility] 03-2023 that was negative. Colonoscopy by [facility] in 11/27/18 that showed some polyps. Pt is due for repeat colonoscopy. Pt's PCP should refer patient back to GI for further workup of her iron deficiency anemia.  Iron/TIBC/Ferritin/ %Sat    Component Value Date/Time   IRON 19 (L) 03/05/2024 1957   TIBC 351 03/05/2024 1957   FERRITIN 28 03/05/2024 1957   IRONPCTSAT 5 (L) 03/05/2024 1957   IRONPCTSAT 12 (L) 05/05/2010 1128  03-07-2024 Pt given 1 additional unit of PRBC last night due to 250 ml recorded EBL during surgery yesterday.   HgB up to 8.4 g/dl today. Discussed with pt that she will need to complete colonoscopy for iron deficiency anemia workup.   Pt states she had colonoscopy scheduled for last month but canceled it due to her brother having surgery. Pt advised to complete colonoscopy soon.  PCP will need to  refer pt for outpatient iron infusions.  S/P reverse total shoulder arthroplasty, left 03-06-2024 had surgery today. Ortho responsible for their own f/u appointments and opiate Rx.  03-07-2024 pt awaiting PT evaluation. Stable for DC otherwise. Had family at home to help.  Stage 3a chronic kidney disease (CKD) (HCC) 03-06-2024 monitor Scr with blood loss.  03-07-2024 stable.   DM2 (diabetes mellitus, type 2) (HCC) 03-06-2024 continue SSI. Hold metformin .  03-07-2024 stable.   Benign essential HTN 03-06-2024 hold ARB for now. Continue cardizem . Stop norvasc  as they are both calcium  channel blockers.  03-07-2024 stable.   DVT prophylaxis: SCD's Start: 03/06/24 1100 Place and maintain sequential compression device Start: 03/05/24 1934    Code Status: Full Code Family Communication: no family at bedside. Spoke to dtr yesterday. Disposition Plan: return home Reason for continuing need for hospitalization: medically stable.  Objective: Vitals:   03/07/24 0450 03/07/24 0748 03/07/24 0814 03/07/24 1125  BP: (!) 152/75 (!) 152/76  (!) 148/89  Pulse: (!) 101 96  94  Resp: 15 20    Temp: 97.6 F (36.4 C) 98.3 F (36.8 C)    TempSrc:  Oral    SpO2: 96% 98% 97%   Weight:      Height:        Intake/Output Summary (Last 24 hours) at 03/07/2024 1204 Last data filed at 03/07/2024 0519 Gross per 24 hour  Intake 1331.36 ml  Output 2900 ml  Net -1568.64 ml   Filed Weights   03/06/24 0102  Weight: 68 kg    Examination:  Physical Exam Vitals and nursing note reviewed.  Constitutional:      General: She is not in acute distress.    Appearance: She is obese. She is not toxic-appearing.  HENT:     Head: Normocephalic and atraumatic.     Nose: Nose normal.  Cardiovascular:     Rate and Rhythm: Normal rate and regular rhythm.     Pulses: Normal pulses.  Pulmonary:     Effort: Pulmonary effort is normal.     Breath sounds: Normal breath sounds.  Abdominal:     General:  Bowel sounds are normal. There is no distension.     Palpations: Abdomen is soft.  Musculoskeletal:     Comments: Has cooling pad on left shoulder. Left shoulder in sling.  Skin:    Capillary Refill: Capillary refill takes less than 2 seconds.  Neurological:     Mental Status: She is alert and oriented to person, place, and time.     Data Reviewed: I have personally reviewed following labs and imaging studies  CBC: Recent Labs  Lab 03/05/24 1348 03/06/24 0700 03/06/24 0704 03/06/24 0945 03/06/24 1830 03/07/24 0649  WBC 8.0 7.4  --   --   --  7.1  NEUTROABS  --   --   --   --   --  4.4  HGB 6.5* 7.3* 8.2* 7.4* 7.6* 8.4*  HCT 22.4* 25.1* 24.0* 24.9* 26.1* 29.0*  MCV 71.1* 72.5*  --   --   --  76.3*  PLT 470* 449*  --   --   --  433*   Basic Metabolic Panel: Recent Labs  Lab 03/05/24 1348 03/06/24 0521 03/06/24 0704 03/07/24 0649  NA 135 134* 136 134*  K 5.0 4.6 5.0 4.5  CL 105 105 106 102  CO2 22 22  --  25  GLUCOSE 163* 182* 173* 171*  BUN 26* 18 19 23   CREATININE 1.31* 1.00 1.10* 1.07*  CALCIUM  8.8* 8.6*  --  8.8*   GFR: Estimated Creatinine Clearance: 44.5 mL/min (A) (by C-G formula based on SCr of 1.07 mg/dL (H)). Liver Function Tests: Recent Labs  Lab 03/05/24 1348  AST 12*  ALT 8  ALKPHOS 68  BILITOT 0.4  PROT 7.9  ALBUMIN 2.9*   Coagulation Profile: Recent Labs  Lab 03/06/24 0700  INR 1.1   CBG: Recent Labs  Lab 03/06/24 1203 03/06/24 1629 03/06/24 2053 03/07/24 0734 03/07/24 1156  GLUCAP 181* 333* 194* 157* 161*   Anemia Panel: Recent Labs    03/05/24 1957 03/05/24 2151  VITAMINB12 501  --   FOLATE  --  6.0  FERRITIN 28  --   TIBC 351  --   IRON 19*  --     Recent Results (from the past 240 hours)  Surgical pcr screen     Status: Abnormal   Collection Time: 03/05/24  3:04 PM   Specimen: Nasal Mucosa; Nasal Swab  Result Value Ref Range Status   MRSA, PCR NEGATIVE NEGATIVE Final   Staphylococcus aureus POSITIVE (A)  NEGATIVE Final    Comment: (NOTE) The Xpert SA Assay (FDA approved for NASAL specimens in patients 67 years of age and older), is one component of a comprehensive surveillance program. It is not intended to diagnose infection nor to guide or monitor treatment. Performed at [facility], [address], [place], [place] [address]      Radiology Studies: DG Chest 2 View Result Date: 03/05/2024 CLINICAL DATA:  Preop exam.  Recent cough, now resolved. EXAM: CHEST - 2 VIEW COMPARISON:  Chest radiograph 05/15/2023 FINDINGS: The cardiomediastinal contours are normal. The lungs are clear. Pulmonary vasculature is normal. No consolidation, pleural effusion, or pneumothorax. Left proximal humeral fracture was characterized on recent imaging. Kyphoplasty within a vertebra at the thoracolumbar junction. IMPRESSION: No active cardiopulmonary disease. Electronically Signed   By: Chadwick Colonel M.D.   On: 03/05/2024 15:53    Scheduled Meds:  aspirin  EC  81 mg Oral Daily   budesonide  (PULMICORT ) nebulizer solution  0.25 mg Nebulization BID   busPIRone   10 mg Oral QHS   Chlorhexidine  Gluconate Cloth  6 each Topical Daily   diltiazem   240 mg Oral Daily   docusate sodium   100 mg Oral BID   DULoxetine   60 mg Oral Daily   ferrous sulfate   325 mg Oral Daily   insulin  aspart  0-15 Units Subcutaneous TID WC   mirabegron ER  25 mg Oral Daily   mupirocin ointment  1 Application Nasal BID   pantoprazole   40 mg Oral Daily   pravastatin   80 mg Oral Daily   primidone   125 mg Oral BID   Continuous Infusions:   LOS: 1 day   Time spent: 50 minutes  Unk Garb, DO  [facility]  03/07/2024, 12:04 PM

## 2024-03-07 NOTE — Plan of Care (Signed)
  Problem: Clinical Measurements: Goal: Diagnostic test results will improve Outcome: Progressing Goal: Respiratory complications will improve Outcome: Progressing Goal: Cardiovascular complication will be avoided Outcome: Progressing   Problem: Pain Managment: Goal: General experience of comfort will improve and/or be controlled Outcome: Progressing   Problem: Safety: Goal: Ability to remain free from injury will improve Outcome: Progressing

## 2024-03-07 NOTE — Plan of Care (Signed)
  Problem: Education: Goal: Knowledge of General Education information will improve Description: Including pain rating scale, medication(s)/side effects and non-pharmacologic comfort measures Outcome: Adequate for Discharge   Problem: Health Behavior/Discharge Planning: Goal: Ability to manage health-related needs will improve Outcome: Adequate for Discharge   Problem: Clinical Measurements: Goal: Ability to maintain clinical measurements within normal limits will improve Outcome: Adequate for Discharge Goal: Will remain free from infection Outcome: Adequate for Discharge Goal: Diagnostic test results will improve Outcome: Adequate for Discharge Goal: Respiratory complications will improve Outcome: Adequate for Discharge Goal: Cardiovascular complication will be avoided Outcome: Adequate for Discharge   Problem: Activity: Goal: Risk for activity intolerance will decrease Outcome: Adequate for Discharge   Problem: Nutrition: Goal: Adequate nutrition will be maintained Outcome: Adequate for Discharge   Problem: Coping: Goal: Level of anxiety will decrease Outcome: Adequate for Discharge   Problem: Elimination: Goal: Will not experience complications related to bowel motility Outcome: Adequate for Discharge Goal: Will not experience complications related to urinary retention Outcome: Adequate for Discharge   Problem: Pain Managment: Goal: General experience of comfort will improve and/or be controlled Outcome: Adequate for Discharge   Problem: Safety: Goal: Ability to remain free from injury will improve Outcome: Adequate for Discharge   Problem: Skin Integrity: Goal: Risk for impaired skin integrity will decrease Outcome: Adequate for Discharge   Problem: Education: Goal: Ability to describe self-care measures that may prevent or decrease complications (Diabetes Survival Skills Education) will improve Outcome: Adequate for Discharge Goal: Individualized Educational  Video(s) Outcome: Adequate for Discharge   Problem: Coping: Goal: Ability to adjust to condition or change in health will improve Outcome: Adequate for Discharge   Problem: Fluid Volume: Goal: Ability to maintain a balanced intake and output will improve Outcome: Adequate for Discharge   Problem: Health Behavior/Discharge Planning: Goal: Ability to identify and utilize available resources and services will improve Outcome: Adequate for Discharge Goal: Ability to manage health-related needs will improve Outcome: Adequate for Discharge   Problem: Metabolic: Goal: Ability to maintain appropriate glucose levels will improve Outcome: Adequate for Discharge   Problem: Nutritional: Goal: Maintenance of adequate nutrition will improve Outcome: Adequate for Discharge Goal: Progress toward achieving an optimal weight will improve Outcome: Adequate for Discharge   Problem: Skin Integrity: Goal: Risk for impaired skin integrity will decrease Outcome: Adequate for Discharge   Problem: Tissue Perfusion: Goal: Adequacy of tissue perfusion will improve Outcome: Adequate for Discharge   Problem: Education: Goal: Knowledge of the prescribed therapeutic regimen will improve Outcome: Adequate for Discharge Goal: Understanding of activity limitations/precautions following surgery will improve Outcome: Adequate for Discharge Goal: Individualized Educational Video(s) Outcome: Adequate for Discharge   Problem: Activity: Goal: Ability to tolerate increased activity will improve Outcome: Adequate for Discharge   Problem: Pain Management: Goal: Pain level will decrease with appropriate interventions Outcome: Adequate for Discharge

## 2024-03-09 LAB — BPAM RBC
Blood Product Expiration Date: 202506212359
Blood Product Expiration Date: 202506242359
ISSUE DATE / TIME: 202505222253
ISSUE DATE / TIME: 202505240049
Unit Type and Rh: 6200
Unit Type and Rh: 6200

## 2024-03-09 LAB — TYPE AND SCREEN
ABO/RH(D): A POS
Antibody Screen: NEGATIVE
Unit division: 0
Unit division: 0

## 2024-03-10 ENCOUNTER — Encounter (HOSPITAL_COMMUNITY): Payer: Self-pay | Admitting: Orthopedic Surgery

## 2024-03-24 ENCOUNTER — Other Ambulatory Visit: Payer: Self-pay | Admitting: Internal Medicine

## 2024-03-24 DIAGNOSIS — Z794 Long term (current) use of insulin: Secondary | ICD-10-CM

## 2024-04-03 ENCOUNTER — Ambulatory Visit

## 2024-04-03 VITALS — BP 176/78 | HR 81 | Temp 97.9°F | Resp 14 | Ht 60.0 in | Wt 151.2 lb

## 2024-04-03 DIAGNOSIS — M810 Age-related osteoporosis without current pathological fracture: Secondary | ICD-10-CM

## 2024-04-03 MED ORDER — DENOSUMAB 60 MG/ML ~~LOC~~ SOSY
60.0000 mg | PREFILLED_SYRINGE | Freq: Once | SUBCUTANEOUS | Status: AC
  Administered 2024-04-03: 60 mg via SUBCUTANEOUS
  Filled 2024-04-03: qty 1

## 2024-04-03 NOTE — Progress Notes (Signed)
 Diagnosis: Osteoporosis  Provider:  Mannam, Praveen MD  Procedure: Injection  Prolia  (Denosumab ), Dose: 60 mg, Site: subcutaneous, Number of injections: 1  Injection Site(s): Right arm  Post Care: Patient declined observation  Discharge: Condition: Good, Destination: Home . AVS Declined  Performed by:  Lendel Quant, RN

## 2024-05-06 NOTE — Therapy (Signed)
 OUTPATIENT PHYSICAL THERAPY SHOULDER EVALUATION   Patient Name: Carrie [name] MRN: [id number] DOB:February 02, 1956, 68 y.o., female Today's Date: 05/07/2024  END OF SESSION:  PT End of Session - 05/07/24 1220     Visit Number 1    Date for PT Re-Evaluation 07/30/24    Authorization Type U[employer]    PT Start Time 1220    PT Stop Time 1305    PT Time Calculation (min) 45 min          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   Liver cirrhosis (HCC)    Nausea and vomiting 01/06/2013   Neuropathy    Bil feet re: to Diabetes   Obesity    Osteoporosis    Plantar fasciitis    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   Stricture and stenosis of esophagus    Stroke (HCC)    TIA (transient ischemic attack)    8-10 yrs ago, no problems since   Tuberculosis    tested positive 2011, no symptoms, was on medicine for 6 months   Past Surgical History:  Procedure Laterality Date   BREAST SURGERY Bilateral    biopsies both breast   CARDIAC CATHETERIZATION  08/30/2010   CESAREAN SECTION     x 2    CHOLECYSTECTOMY     COLONOSCOPY     FOOT SURGERY Left    spur removal   HERNIA REPAIR     umbilical hernia repaired at the same time as cholecystectomy   IR FLUORO GUIDE CV LINE RIGHT  04/12/2021   IR US  GUIDE VASC ACCESS RIGHT  04/12/2021   JOINT REPLACEMENT     bilat knee    KNEE SURGERY Bilateral    x 2 joint knee replacements   KYPHOPLASTY N/A 03/28/2022   Procedure: LUMBAR ONE KYPHOPLASTY;  Surgeon: Beuford Anes, MD;  Location: [facility];  Service: Orthopedics;   Laterality: N/A;  LUMBAR ONE KYPHOPLASTY   REVERSE SHOULDER ARTHROPLASTY Left 03/06/2024   Procedure: ARTHROPLASTY, SHOULDER, TOTAL, REVERSE;  Surgeon: Dozier Soulier, MD;  Location: [facility];  Service: Orthopedics;  Laterality: Left;   SHOULDER ARTHROSCOPY Right 12/18/2022   Procedure: RIGHT SHOULDER ARTHROSCOPY, ACROMIOPLASTY AND DEBRIDEMENT;  Surgeon: Sheril Coy, MD;  Location: [facility];  Service: Orthopedics;  Laterality: Right;   TOTAL HIP ARTHROPLASTY Left 10/26/2022   Procedure: TOTAL HIP ARTHROPLASTY ANTERIOR APPROACH;  Surgeon: Yvone Rush, MD;  Location: [facility];  Service: Orthopedics;  Laterality: Left;   TOTAL KNEE REVISION Left 10/03/2021   Procedure: LEFT TOTAL KNEE REVISION POLY SWAP WITH IRRIGATION AND DEBRIDEMENT;  Surgeon: Sheril Coy, MD;  Location: [facility];  Service: Orthopedics;  Laterality: Left;   UPPER GASTROINTESTINAL ENDOSCOPY     WISDOM TOOTH EXTRACTION     Patient Active Problem List   Diagnosis Date Noted   S/P reverse total shoulder arthroplasty, left 03/06/2024   Acute on chronic anemia 03/05/2024   OP (osteoporosis)  10/21/2023   Moderate persistent asthma 10/26/2022   Stage 3a chronic kidney disease (CKD) (HCC) 04/06/2022   Microcytic anemia 03/11/2022   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/16/2020   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/2020   Psoriatic arthritis (HCC) 07/08/2017   Mild neurocognitive disorder 10/18/2016   DM2 (diabetes mellitus, type 2) (HCC) 12/04/2013   Benign essential HTN 04/21/2013   Hyperlipidemia 01/09/2008   OBESITY 01/09/2008   GERD with esophageal stricture and stenosis s/p dilation  01/09/2008   HERNIA, VENTRAL 01/09/2008   Anxiety and depression 03/25/2007    PCP: Madelin Brought  REFERRING PROVIDER: Eva Herring  REFERRING DIAG: Lt reverse TSA  THERAPY DIAG:  S/P reverse total shoulder arthroplasty, left  Stiffness of left shoulder, not elsewhere classified  Muscle weakness  (generalized)  Rationale for Evaluation and Treatment: Rehabilitation  ONSET DATE: 03/06/24  SUBJECTIVE:                                                                                                                                                                                      SUBJECTIVE STATEMENT: Doing fine I think. The shoulder is fine, no pain in it.   PERTINENT HISTORY: Bilateral knee replacements, L total knee revision 2022 2023 kyphoplasty L1  L THA 2024  PAIN:  Are you having pain? No  PRECAUTIONS: Shoulder lifting over 5#  RED FLAGS: None   WEIGHT BEARING RESTRICTIONS: No  FALLS:  Has patient fallen in last 6 months? Yes. Number of falls 1  LIVING ENVIRONMENT: Lives with: lives alone Lives in: House/apartment Stairs: Yes: External: 2 steps; can reach both  OCCUPATION: Disability   PLOF: Independent  PATIENT GOALS: get back to normal  NEXT MD VISIT:   OBJECTIVE:  Note: Objective measures were completed at Evaluation unless otherwise noted.  DIAGNOSTIC FINDINGS:  Acute displaced proximal left humerus surgical neck fracture.  IMPRESSION: 1. Surgical neck fracture left proximal humerus with shaft 2.1 cm lateral/proximal to the humeral head fragment. Apex anterior angulation between the major fracture fragments. Moderate comminution within the anterior fracture fragment extending obliquely into the left humeral metadiaphysis. This anterior fracture extends up towards the lesser tuberosity but a separate lesser tuberosity fragment is not well seen. There is also some mild comminution laterally along the surgical neck fracture site. 2. Greater tuberosity fracture displaced only 0.4 cm. 3. Although technically a Neer 2 part fracture, the comminution with intermediary fragments along the humeral metadiaphysis may indicate greater complexity than is typical. 4. Indistinctness of tissue planes and local hematoma tracking along musculature adjacent to the  fracture, most notably along the biceps. 5. Old healed left rib fractures. 6.  Aortic Atherosclerosis (ICD10-I70.0).   COGNITION: Overall cognitive status: Within functional limits for tasks assessed     SENSATION: WFL  POSTURE: Rounded shoulders, R shoulder is slightly hiked   UPPER EXTREMITY ROM:   Active ROM Right eval Left eval  Shoulder flexion  65 p!  Shoulder extension    Shoulder abduction  55 p!  Shoulder adduction    Shoulder internal rotation  To gluteal fold  Shoulder external rotation  C1  Elbow flexion    Elbow extension    Wrist flexion    Wrist extension    Wrist ulnar deviation    Wrist radial deviation    Wrist pronation    Wrist supination    (Blank rows = not tested)  UPPER EXTREMITY MMT:  MMT Right eval Left eval  Shoulder flexion  2-  Shoulder extension    Shoulder abduction  2-  Shoulder adduction    Shoulder internal rotation  2-  Shoulder external rotation  2-  Middle trapezius    Lower trapezius    Elbow flexion  5  Elbow extension  5  Wrist flexion    Wrist extension    Wrist ulnar deviation    Wrist radial deviation    Wrist pronation    Wrist supination    Grip strength (lbs)    (Blank rows = not tested)  SHOULDER SPECIAL TESTS: Rotator cuff assessment: Drop arm test: positive , Empty can test: positive , and Full can test: positive  Biceps assessment: Speed's test: negative  JOINT MOBILITY TESTING:  PROM all directions, muscle guards above 90d and has pain  PALPATION:  No TTP, was advised about cross friction massage to prevent scar tissue build up                                                                                                                              TREATMENT DATE:  05/08/23- EVAL, HEP   PATIENT EDUCATION: Education details: POC, HEP Person educated: Patient Education method: Medical illustrator Education comprehension: verbalized understanding and returned demonstration  HOME  EXERCISE PROGRAM: Access Code: [email or web address] URL: [email or web address] Date: 05/07/2024 Prepared by: Almetta Fam  Exercises - Standing Shoulder Flexion AAROM with Dowel  - 1 x daily - 7 x weekly - 2 sets - 10 reps - Standing Shoulder Extension with Dowel  - 1 x daily - 7 x weekly - 2 sets - 10 reps - Standing Bilateral Shoulder Internal Rotation AAROM with Dowel  - 1 x daily - 7 x weekly - 2 sets - 10 reps - Seated Shoulder External Rotation AAROM with Dowel  - 1 x daily - 7 x weekly - 2 sets - 10 reps - Standing Shoulder Abduction AAROM with Dowel  - 1 x daily - 7 x weekly - 2 sets - 10 reps - Circular Shoulder Pendulum with Table Support  - 1 x daily - 7 x weekly - 2 sets - 10 reps  ASSESSMENT:  CLINICAL IMPRESSION: Patient is a 68 y.o. female who was seen today for physical therapy evaluation and treatment for Lt reverse TSA. Patient had a fall on 02/29/24 where she got tangled in the dog leash, which resulted in a proximal humerus fracture. She is 8 week outs from her DOS. She reports no pain but has limitations with ROM and strength. She did have some pain with resistance and PROM. We started her on a HEP to work on [employer] and will gradually progress with AROM and PROM. Patient will benefit from skilled PT to address her L shoulder impairments to be able to reach overhead and behind her back to complete ADLs.   OBJECTIVE IMPAIRMENTS: decreased ROM, decreased strength, impaired UE functional use, and improper body mechanics.   ACTIVITY LIMITATIONS: carrying, lifting, reach over head, and hygiene/grooming  PARTICIPATION LIMITATIONS: cleaning, laundry, shopping, community activity, and yard work  [facility] POTENTIAL: Good  CLINICAL DECISION MAKING: Stable/uncomplicated  EVALUATION COMPLEXITY: Low  GOALS: Goals reviewed with patient? Yes  SHORT TERM GOALS: Target date: 06/18/24  Patient will be independent with initial HEP.  Baseline:  Goal status: INITIAL  2.  Patient will  increase L shoulder flexion and abduction ROM to 100d  Baseline: 65 flex, 55 abd Goal status: INITIAL   LONG TERM GOALS: Target date: 07/30/24  Patient will be independent with advanced/ongoing HEP to improve outcomes and carryover.  Baseline:  Goal status: INITIAL  2.  Patient to improve L shoulder AROM to [employer] without pain provocation to allow for increased ease of ADLs.  Baseline:  Goal status: INITIAL  3.  Patient will demonstrate improved functional UE strength as demonstrated by 4/5 in all muscle groups. Baseline:  Goal status: INITIAL  4.  Patient will be able to reach over head to do her hair without difficulty    Baseline:  Goal status: INITIAL   PLAN:  PT FREQUENCY: 2x/week  PT DURATION: 12 weeks  PLANNED INTERVENTIONS: 97110-Therapeutic exercises, 97530- Therapeutic activity, [id number]- Neuromuscular re-education, 97535- Self Care, [id number]- Manual therapy, 97016- Vasopneumatic device, 79439 (1-2 muscles), 20561 (3+ muscles)- Dry Needling, Patient/Family education, Joint mobilization, Spinal mobilization, Cryotherapy, and Moist heat  PLAN FOR NEXT SESSION: pulleys, AAROM, PROM, light strengthening as tolerated  Following Dr. Melita protocol  -Week 6-8: improve shoulder strength, gradually progress AAROM, AROM and PROM in all planes, begin pulley ROM, pendelums, begin isometrics   -Weeks 8-12: progress PROM and AROM, add AROM exercises sidelying, start resistance with bands   Almetta Fam, PT 05/07/2024, 1:03 PM

## 2024-05-07 ENCOUNTER — Ambulatory Visit: Attending: Orthopedic Surgery

## 2024-05-07 DIAGNOSIS — M6281 Muscle weakness (generalized): Secondary | ICD-10-CM | POA: Diagnosis present

## 2024-05-07 DIAGNOSIS — Z96612 Presence of left artificial shoulder joint: Secondary | ICD-10-CM | POA: Insufficient documentation

## 2024-05-07 DIAGNOSIS — M25612 Stiffness of left shoulder, not elsewhere classified: Secondary | ICD-10-CM | POA: Diagnosis present

## 2024-05-11 ENCOUNTER — Ambulatory Visit: Admitting: Physical Therapy

## 2024-05-14 NOTE — Therapy (Signed)
 OUTPATIENT PHYSICAL THERAPY SHOULDER TREATMENT   Patient Name: Carrie [name] MRN: [id number] DOB:Aug 18, 1956, 68 y.o., female Today's Date: 05/15/2024  END OF SESSION:  PT End of Session - 05/15/24 0929     Visit Number 2    Date for PT Re-Evaluation 07/30/24    Authorization Type UHC    PT Start Time 0930    PT Stop Time 1015    PT Time Calculation (min) 45 min           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   Liver cirrhosis (HCC)    Nausea and vomiting 01/06/2013   Neuropathy    Bil feet re: to Diabetes   Obesity    Osteoporosis    Plantar fasciitis    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   Stricture and stenosis of esophagus    Stroke (HCC)    TIA (transient ischemic attack)    8-10 yrs ago, no problems since   Tuberculosis    tested positive 2011, no symptoms, was on medicine for 6 months   Past Surgical History:  Procedure Laterality Date   BREAST SURGERY Bilateral    biopsies both breast   CARDIAC CATHETERIZATION  08/30/2010   CESAREAN SECTION     x 2    CHOLECYSTECTOMY     COLONOSCOPY     FOOT SURGERY Left    spur removal   HERNIA REPAIR     umbilical hernia repaired at the same time as cholecystectomy   IR FLUORO GUIDE CV LINE RIGHT  04/12/2021   IR US  GUIDE VASC ACCESS RIGHT  04/12/2021   JOINT REPLACEMENT     bilat knee    KNEE SURGERY Bilateral    x 2 joint knee replacements   KYPHOPLASTY N/A 03/28/2022   Procedure: LUMBAR ONE KYPHOPLASTY;  Surgeon: Beuford Anes, MD;  Location: [facility];  Service: Orthopedics;   Laterality: N/A;  LUMBAR ONE KYPHOPLASTY   REVERSE SHOULDER ARTHROPLASTY Left 03/06/2024   Procedure: ARTHROPLASTY, SHOULDER, TOTAL, REVERSE;  Surgeon: Dozier Soulier, MD;  Location: [facility];  Service: Orthopedics;  Laterality: Left;   SHOULDER ARTHROSCOPY Right 12/18/2022   Procedure: RIGHT SHOULDER ARTHROSCOPY, ACROMIOPLASTY AND DEBRIDEMENT;  Surgeon: Sheril Coy, MD;  Location: [facility];  Service: Orthopedics;  Laterality: Right;   TOTAL HIP ARTHROPLASTY Left 10/26/2022   Procedure: TOTAL HIP ARTHROPLASTY ANTERIOR APPROACH;  Surgeon: Yvone Rush, MD;  Location: [facility];  Service: Orthopedics;  Laterality: Left;   TOTAL KNEE REVISION Left 10/03/2021   Procedure: LEFT TOTAL KNEE REVISION POLY SWAP WITH IRRIGATION AND DEBRIDEMENT;  Surgeon: Sheril Coy, MD;  Location: [facility];  Service: Orthopedics;  Laterality: Left;   UPPER GASTROINTESTINAL ENDOSCOPY     WISDOM TOOTH EXTRACTION     Patient Active Problem List   Diagnosis Date Noted   S/P reverse total shoulder arthroplasty, left 03/06/2024   Acute on chronic anemia 03/05/2024   OP (  osteoporosis) 10/21/2023   Moderate persistent asthma 10/26/2022   Stage 3a chronic kidney disease (CKD) (HCC) 04/06/2022   Microcytic anemia 03/11/2022   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/16/2020   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/2020   Psoriatic arthritis (HCC) 07/08/2017   Mild neurocognitive disorder 10/18/2016   DM2 (diabetes mellitus, type 2) (HCC) 12/04/2013   Benign essential HTN 04/21/2013   Hyperlipidemia 01/09/2008   OBESITY 01/09/2008   GERD with esophageal stricture and stenosis s/p dilation  01/09/2008   HERNIA, VENTRAL 01/09/2008   Anxiety and depression 03/25/2007    PCP: Madelin Brought  REFERRING PROVIDER: Eva Herring  REFERRING DIAG: Lt reverse TSA  THERAPY DIAG:  S/P reverse total shoulder arthroplasty, left  Stiffness of left shoulder, not elsewhere classified  Muscle weakness  (generalized)  Rationale for Evaluation and Treatment: Rehabilitation  ONSET DATE: 03/06/24  SUBJECTIVE:                                                                                                                                                                                      SUBJECTIVE STATEMENT: Having some pain in the shoulder from doing exercises. But it it he R side that hurts. The L one hurts while I am doing it but I might be pushing it.   PERTINENT HISTORY: Bilateral knee replacements, L total knee revision 2022 2023 kyphoplasty L1  L THA 2024  PAIN:  Are you having pain? No  PRECAUTIONS: Shoulder lifting over 5#  RED FLAGS: None   WEIGHT BEARING RESTRICTIONS: No  FALLS:  Has patient fallen in last 6 months? Yes. Number of falls 1  LIVING ENVIRONMENT: Lives with: lives alone Lives in: House/apartment Stairs: Yes: External: 2 steps; can reach both  OCCUPATION: Disability   PLOF: Independent  PATIENT GOALS: get back to normal  NEXT MD VISIT:   OBJECTIVE:  Note: Objective measures were completed at Evaluation unless otherwise noted.  DIAGNOSTIC FINDINGS:  Acute displaced proximal left humerus surgical neck fracture.  IMPRESSION: 1. Surgical neck fracture left proximal humerus with shaft 2.1 cm lateral/proximal to the humeral head fragment. Apex anterior angulation between the major fracture fragments. Moderate comminution within the anterior fracture fragment extending obliquely into the left humeral metadiaphysis. This anterior fracture extends up towards the lesser tuberosity but a separate lesser tuberosity fragment is not well seen. There is also some mild comminution laterally along the surgical neck fracture site. 2. Greater tuberosity fracture displaced only 0.4 cm. 3. Although technically a Neer 2 part fracture, the comminution with intermediary fragments along the humeral metadiaphysis may indicate greater complexity than is typical. 4.  Indistinctness of tissue planes and  local hematoma tracking along musculature adjacent to the fracture, most notably along the biceps. 5. Old healed left rib fractures. 6.  Aortic Atherosclerosis (ICD10-I70.0).   COGNITION: Overall cognitive status: Within functional limits for tasks assessed     SENSATION: WFL  POSTURE: Rounded shoulders, R shoulder is slightly hiked   UPPER EXTREMITY ROM:   Active ROM Right eval Left eval  Shoulder flexion  65 p!  Shoulder extension    Shoulder abduction  55 p!  Shoulder adduction    Shoulder internal rotation  To gluteal fold  Shoulder external rotation  C1  Elbow flexion    Elbow extension    Wrist flexion    Wrist extension    Wrist ulnar deviation    Wrist radial deviation    Wrist pronation    Wrist supination    (Blank rows = not tested)  UPPER EXTREMITY MMT:  MMT Right eval Left eval  Shoulder flexion  2-  Shoulder extension    Shoulder abduction  2-  Shoulder adduction    Shoulder internal rotation  2-  Shoulder external rotation  2-  Middle trapezius    Lower trapezius    Elbow flexion  5  Elbow extension  5  Wrist flexion    Wrist extension    Wrist ulnar deviation    Wrist radial deviation    Wrist pronation    Wrist supination    Grip strength (lbs)    (Blank rows = not tested)  SHOULDER SPECIAL TESTS: Rotator cuff assessment: Drop arm test: positive , Empty can test: positive , and Full can test: positive  Biceps assessment: Speed's test: negative  JOINT MOBILITY TESTING:  PROM all directions, muscle guards above 90d and has pain  PALPATION:  No TTP, was advised about cross friction massage to prevent scar tissue build up                                                                                                                              TREATMENT DATE:  05/15/24 Pulleys flexion and abd 2 mins each way  AAROM with dowel all directions x10  Isometrics with ball against wall 5s holds  PROM  with end range holds  Sidelying ER x10, then 1# x10 Yellow band ext and rows 2x10  05/08/23- EVAL, HEP   PATIENT EDUCATION: Education details: POC, HEP Person educated: Patient Education method: Medical illustrator Education comprehension: verbalized understanding and returned demonstration  HOME EXERCISE PROGRAM: Access Code: [email or web address] URL: [email or web address] Date: 05/07/2024 Prepared by: Almetta Fam  Exercises - Standing Shoulder Flexion AAROM with Dowel  - 1 x daily - 7 x weekly - 2 sets - 10 reps - Standing Shoulder Extension with Dowel  - 1 x daily - 7 x weekly - 2 sets - 10 reps - Standing Bilateral Shoulder Internal Rotation AAROM with Dowel  - 1 x daily - 7 x weekly - 2 sets - 10 reps - Seated  Shoulder External Rotation AAROM with Dowel  - 1 x daily - 7 x weekly - 2 sets - 10 reps - Standing Shoulder Abduction AAROM with Dowel  - 1 x daily - 7 x weekly - 2 sets - 10 reps - Circular Shoulder Pendulum with Table Support  - 1 x daily - 7 x weekly - 2 sets - 10 reps  ASSESSMENT:  CLINICAL IMPRESSION: Patient is a 68 y.o. female who was seen today for physical therapy treatment for Lt reverse TSA. She reports some pain from doing exercises. Patient was advised to no over do and to modified exercises if they cause pain. We focused mostly on AAROM today. She reported liking the pulleys and will order some to use at home. Pt has some pain with isometric IR. PROM is limited mostly into abduction, followed by flexion. ER/IR passive range look good. Added in some light strengthening at end of visit. Patient will benefit from skilled PT to address her L shoulder impairments to be able to reach overhead and behind her back to complete ADLs.      OBJECTIVE IMPAIRMENTS: decreased ROM, decreased strength, impaired UE functional use, and improper body mechanics.   ACTIVITY LIMITATIONS: carrying, lifting, reach over head, and hygiene/grooming  PARTICIPATION  LIMITATIONS: cleaning, laundry, shopping, community activity, and yard work  [facility] POTENTIAL: Good  CLINICAL DECISION MAKING: Stable/uncomplicated  EVALUATION COMPLEXITY: Low  GOALS: Goals reviewed with patient? Yes  SHORT TERM GOALS: Target date: 06/18/24  Patient will be independent with initial HEP.  Baseline:  Goal status: INITIAL  2.  Patient will increase L shoulder flexion and abduction ROM to 100d  Baseline: 65 flex, 55 abd Goal status: INITIAL   LONG TERM GOALS: Target date: 07/30/24  Patient will be independent with advanced/ongoing HEP to improve outcomes and carryover.  Baseline:  Goal status: INITIAL  2.  Patient to improve L shoulder AROM to [facility] without pain provocation to allow for increased ease of ADLs.  Baseline:  Goal status: INITIAL  3.  Patient will demonstrate improved functional UE strength as demonstrated by 4/5 in all muscle groups. Baseline:  Goal status: INITIAL  4.  Patient will be able to reach over head to do her hair without difficulty    Baseline:  Goal status: INITIAL   PLAN:  PT FREQUENCY: 2x/week  PT DURATION: 12 weeks  PLANNED INTERVENTIONS: 97110-Therapeutic exercises, 97530- Therapeutic activity, [id number]- Neuromuscular re-education, 97535- Self Care, [id number]- Manual therapy, 97016- Vasopneumatic device, 79439 (1-2 muscles), 20561 (3+ muscles)- Dry Needling, Patient/Family education, Joint mobilization, Spinal mobilization, Cryotherapy, and Moist heat  PLAN FOR NEXT SESSION: pulleys, AAROM, PROM, light strengthening as tolerated  Following Dr. Melita protocol  -Week 6-8: improve shoulder strength, gradually progress AAROM, AROM and PROM in all planes, begin pulley ROM, pendelums, begin isometrics   -Weeks 8-12: progress PROM and AROM, add AROM exercises sidelying, start resistance with bands   Almetta Fam, PT 05/15/2024, 10:13 AM

## 2024-05-15 ENCOUNTER — Ambulatory Visit: Attending: Orthopedic Surgery

## 2024-05-15 DIAGNOSIS — M25612 Stiffness of left shoulder, not elsewhere classified: Secondary | ICD-10-CM | POA: Insufficient documentation

## 2024-05-15 DIAGNOSIS — M6281 Muscle weakness (generalized): Secondary | ICD-10-CM | POA: Insufficient documentation

## 2024-05-15 DIAGNOSIS — Z96612 Presence of left artificial shoulder joint: Secondary | ICD-10-CM | POA: Diagnosis present

## 2024-05-19 ENCOUNTER — Ambulatory Visit: Admitting: Physical Therapy

## 2024-05-22 ENCOUNTER — Encounter: Admitting: Physical Therapy

## 2024-05-25 ENCOUNTER — Ambulatory Visit: Admitting: Internal Medicine

## 2024-05-26 ENCOUNTER — Ambulatory Visit: Admitting: Physical Therapy

## 2024-05-26 ENCOUNTER — Encounter: Payer: Self-pay | Admitting: Physical Therapy

## 2024-05-26 DIAGNOSIS — M6281 Muscle weakness (generalized): Secondary | ICD-10-CM

## 2024-05-26 DIAGNOSIS — Z96612 Presence of left artificial shoulder joint: Secondary | ICD-10-CM

## 2024-05-26 DIAGNOSIS — M25612 Stiffness of left shoulder, not elsewhere classified: Secondary | ICD-10-CM

## 2024-05-26 NOTE — Therapy (Signed)
 OUTPATIENT PHYSICAL THERAPY SHOULDER TREATMENT   Patient Name: Carrie [name] MRN: [id number] DOB:11-03-1955, 68 y.o., female Today's Date: 05/26/2024  END OF SESSION:  PT End of Session - 05/26/24 1300     Visit Number 3    Date for PT Re-Evaluation 07/30/24    PT Start Time 1300    PT Stop Time 1345    PT Time Calculation (min) 45 min    Activity Tolerance Patient tolerated treatment well    Behavior During Therapy WFL for tasks assessed/performed           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   Liver cirrhosis (HCC)    Nausea and vomiting 01/06/2013   Neuropathy    Bil feet re: to Diabetes   Obesity    Osteoporosis    Plantar fasciitis    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   Stricture and stenosis of esophagus    Stroke (HCC)    TIA (transient ischemic attack)    8-10 yrs ago, no problems since   Tuberculosis    tested positive 2011, no symptoms, was on medicine for 6 months   Past Surgical History:  Procedure Laterality Date   BREAST SURGERY Bilateral    biopsies both breast   CARDIAC CATHETERIZATION  08/30/2010   CESAREAN SECTION     x 2    CHOLECYSTECTOMY     COLONOSCOPY     FOOT SURGERY Left    spur removal   HERNIA REPAIR     umbilical hernia repaired at the same time as cholecystectomy   IR FLUORO GUIDE CV LINE RIGHT  04/12/2021   IR US  GUIDE VASC ACCESS RIGHT  04/12/2021   JOINT REPLACEMENT     bilat knee    KNEE SURGERY Bilateral    x 2 joint knee replacements   KYPHOPLASTY N/A 03/28/2022   Procedure: LUMBAR ONE  KYPHOPLASTY;  Surgeon: Beuford Anes, MD;  Location: [facility];  Service: Orthopedics;  Laterality: N/A;  LUMBAR ONE KYPHOPLASTY   REVERSE SHOULDER ARTHROPLASTY Left 03/06/2024   Procedure: ARTHROPLASTY, SHOULDER, TOTAL, REVERSE;  Surgeon: Dozier Soulier, MD;  Location: [facility];  Service: Orthopedics;  Laterality: Left;   SHOULDER ARTHROSCOPY Right 12/18/2022   Procedure: RIGHT SHOULDER ARTHROSCOPY, ACROMIOPLASTY AND DEBRIDEMENT;  Surgeon: Sheril Coy, MD;  Location: [facility];  Service: Orthopedics;  Laterality: Right;   TOTAL HIP ARTHROPLASTY Left 10/26/2022   Procedure: TOTAL HIP ARTHROPLASTY ANTERIOR APPROACH;  Surgeon: Yvone Rush, MD;  Location: [facility];  Service: Orthopedics;  Laterality: Left;   TOTAL KNEE REVISION Left 10/03/2021   Procedure: LEFT TOTAL KNEE REVISION POLY SWAP WITH IRRIGATION AND DEBRIDEMENT;  Surgeon: Sheril Coy, MD;  Location: [facility];  Service: Orthopedics;  Laterality: Left;   UPPER GASTROINTESTINAL ENDOSCOPY     WISDOM TOOTH EXTRACTION     Patient Active Problem List   Diagnosis Date Noted   S/P reverse total shoulder  arthroplasty, left 03/06/2024   Acute on chronic anemia 03/05/2024   OP (osteoporosis) 10/21/2023   Moderate persistent asthma 10/26/2022   Stage 3a chronic kidney disease (CKD) (HCC) 04/06/2022   Microcytic anemia 03/11/2022   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/16/2020   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/2020   Psoriatic arthritis (HCC) 07/08/2017   Mild neurocognitive disorder 10/18/2016   DM2 (diabetes mellitus, type 2) (HCC) 12/04/2013   Benign essential HTN 04/21/2013   Hyperlipidemia 01/09/2008   OBESITY 01/09/2008   GERD with esophageal stricture and stenosis s/p dilation  01/09/2008   HERNIA, VENTRAL 01/09/2008   Anxiety and depression 03/25/2007    PCP: Madelin Brought  REFERRING PROVIDER: Eva Herring  REFERRING DIAG: Lt reverse TSA  THERAPY DIAG:  S/P reverse total shoulder arthroplasty,  left  Stiffness of left shoulder, not elsewhere classified  Muscle weakness (generalized)  Rationale for Evaluation and Treatment: Rehabilitation  ONSET DATE: 03/06/24  SUBJECTIVE:                                                                                                                                                                                      SUBJECTIVE STATEMENT: The R shoulder is hurting more than the left  PERTINENT HISTORY: Bilateral knee replacements, L total knee revision 2022 2023 kyphoplasty L1  L THA 2024  PAIN:  Are you having pain? 7/10 R shoulder   PRECAUTIONS: Shoulder lifting over 5#  RED FLAGS: None   WEIGHT BEARING RESTRICTIONS: No  FALLS:  Has patient fallen in last 6 months? Yes. Number of falls 1  LIVING ENVIRONMENT: Lives with: lives alone Lives in: House/apartment Stairs: Yes: External: 2 steps; can reach both  OCCUPATION: Disability   PLOF: Independent  PATIENT GOALS: get back to normal  NEXT MD VISIT:   OBJECTIVE:  Note: Objective measures were completed at Evaluation unless otherwise noted.  DIAGNOSTIC FINDINGS:  Acute displaced proximal left humerus surgical neck fracture.  IMPRESSION: 1. Surgical neck fracture left proximal humerus with shaft 2.1 cm lateral/proximal to the humeral head fragment. Apex anterior angulation between the major fracture fragments. Moderate comminution within the anterior fracture fragment extending obliquely into the left humeral metadiaphysis. This anterior fracture extends up towards the lesser tuberosity but a separate lesser tuberosity fragment is not well seen. There is also some mild comminution laterally along the surgical neck fracture site. 2. Greater tuberosity fracture displaced only 0.4 cm. 3. Although technically a Neer 2 part fracture, the comminution with intermediary fragments along the humeral metadiaphysis may indicate greater complexity than is typical. 4. Indistinctness  of tissue planes and local hematoma tracking along musculature adjacent to the  fracture, most notably along the biceps. 5. Old healed left rib fractures. 6.  Aortic Atherosclerosis (ICD10-I70.0).   COGNITION: Overall cognitive status: Within functional limits for tasks assessed     SENSATION: WFL  POSTURE: Rounded shoulders, R shoulder is slightly hiked   UPPER EXTREMITY ROM:   Active ROM Right eval Left eval  Shoulder flexion  65 p!  Shoulder extension    Shoulder abduction  55 p!  Shoulder adduction    Shoulder internal rotation  To gluteal fold  Shoulder external rotation  C1  Elbow flexion    Elbow extension    Wrist flexion    Wrist extension    Wrist ulnar deviation    Wrist radial deviation    Wrist pronation    Wrist supination    (Blank rows = not tested)  UPPER EXTREMITY MMT:  MMT Right eval Left eval  Shoulder flexion  2-  Shoulder extension    Shoulder abduction  2-  Shoulder adduction    Shoulder internal rotation  2-  Shoulder external rotation  2-  Middle trapezius    Lower trapezius    Elbow flexion  5  Elbow extension  5  Wrist flexion    Wrist extension    Wrist ulnar deviation    Wrist radial deviation    Wrist pronation    Wrist supination    Grip strength (lbs)    (Blank rows = not tested)  SHOULDER SPECIAL TESTS: Rotator cuff assessment: Drop arm test: positive , Empty can test: positive , and Full can test: positive  Biceps assessment: Speed's test: negative  JOINT MOBILITY TESTING:  PROM all directions, muscle guards above 90d and has pain  PALPATION:  No TTP, was advised about cross friction massage to prevent scar tissue build up                                                                                                                              TREATMENT DATE:  05/26/24 UBE L 1 x 2.5 min each  AAROM 1lb WaTE flex, Ext, IR x10 Row & Ext red 2x10 PROM  to LUE with end range holds   GH Jt mobs  05/15/24 Pulleys  flexion and abd 2 mins each way  AAROM with dowel all directions x10  Isometrics with ball against wall 5s holds  PROM with end range holds  Sidelying ER x10, then 1# x10 Yellow band ext and rows 2x10  05/08/23- EVAL, HEP   PATIENT EDUCATION: Education details: POC, HEP Person educated: Patient Education method: Medical illustrator Education comprehension: verbalized understanding and returned demonstration  HOME EXERCISE PROGRAM: Access Code: [email or web address] URL: [email or web address] Date: 05/07/2024 Prepared by: Almetta Fam  Exercises - Standing Shoulder Flexion AAROM with Dowel  - 1 x daily - 7 x weekly - 2 sets - 10 reps - Standing Shoulder Extension with Dowel  - 1 x daily - 7 x weekly - 2 sets - 10  reps - Standing Bilateral Shoulder Internal Rotation AAROM with Dowel  - 1 x daily - 7 x weekly - 2 sets - 10 reps - Seated Shoulder External Rotation AAROM with Dowel  - 1 x daily - 7 x weekly - 2 sets - 10 reps - Standing Shoulder Abduction AAROM with Dowel  - 1 x daily - 7 x weekly - 2 sets - 10 reps - Circular Shoulder Pendulum with Table Support  - 1 x daily - 7 x weekly - 2 sets - 10 reps  ASSESSMENT:  CLINICAL IMPRESSION: Patient is a 68 y.o. female who was seen today for physical therapy treatment for Lt reverse TSA. She is currently 11 weeks post op but not at the appropriate functional level. LUE ROM is very limited, postural compensation with rows and extensions. Frequent cues needed to relax during PROM due to guarding.      OBJECTIVE IMPAIRMENTS: decreased ROM, decreased strength, impaired UE functional use, and improper body mechanics.   ACTIVITY LIMITATIONS: carrying, lifting, reach over head, and hygiene/grooming  PARTICIPATION LIMITATIONS: cleaning, laundry, shopping, community activity, and yard work  [facility] POTENTIAL: Good  CLINICAL DECISION MAKING: Stable/uncomplicated  EVALUATION COMPLEXITY: Low  GOALS: Goals reviewed with patient?  Yes  SHORT TERM GOALS: Target date: 06/18/24  Patient will be independent with initial HEP.  Baseline:  Goal status: Met 05/25/24  2.  Patient will increase L shoulder flexion and abduction ROM to 100d  Baseline: 65 flex, 55 abd Goal status: ongoing 05/26/24   LONG TERM GOALS: Target date: 07/30/24  Patient will be independent with advanced/ongoing HEP to improve outcomes and carryover.  Baseline:  Goal status: INITIAL  2.  Patient to improve L shoulder AROM to [facility] without pain provocation to allow for increased ease of ADLs.  Baseline:  Goal status: INITIAL  3.  Patient will demonstrate improved functional UE strength as demonstrated by 4/5 in all muscle groups. Baseline:  Goal status: INITIAL  4.  Patient will be able to reach over head to do her hair without difficulty    Baseline:  Goal status: INITIAL   PLAN:  PT FREQUENCY: 2x/week  PT DURATION: 12 weeks  PLANNED INTERVENTIONS: 97110-Therapeutic exercises, 97530- Therapeutic activity, 97112- Neuromuscular re-education, 97535- Self Care, 02859- Manual therapy, 97016- Vasopneumatic device, 79439 (1-2 muscles), 20561 (3+ muscles)- Dry Needling, Patient/Family education, Joint mobilization, Spinal mobilization, Cryotherapy, and Moist heat  PLAN FOR NEXT SESSION: pulleys, AAROM, PROM, light strengthening as tolerated  Following Dr. Melita protocol  -Week 6-8: improve shoulder strength, gradually progress AAROM, AROM and PROM in all planes, begin pulley ROM, pendelums, begin isometrics   -Weeks 8-12: progress PROM and AROM, add AROM exercises sidelying, start resistance with bands   Tanda KANDICE Sorrow, PTA 05/26/2024, 1:01 PM

## 2024-05-27 NOTE — Therapy (Signed)
 OUTPATIENT PHYSICAL THERAPY SHOULDER TREATMENT   Patient Name: Carrie [name] MRN: [id number] DOB:05-04-1956, 68 y.o., female Today's Date: 05/28/2024  END OF SESSION:  PT End of Session - 05/28/24 1303     Visit Number 4    Date for PT Re-Evaluation 07/30/24    PT Start Time 1310    PT Stop Time 1355    PT Time Calculation (min) 45 min    Activity Tolerance Patient tolerated treatment well    Behavior During Therapy WFL for tasks assessed/performed            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   Liver cirrhosis (HCC)    Nausea and vomiting 01/06/2013   Neuropathy    Bil feet re: to Diabetes   Obesity    Osteoporosis    Plantar fasciitis    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   Stricture and stenosis of esophagus    Stroke (HCC)    TIA (transient ischemic attack)    8-10 yrs ago, no problems since   Tuberculosis    tested positive 2011, no symptoms, was on medicine for 6 months   Past Surgical History:  Procedure Laterality Date   BREAST SURGERY Bilateral    biopsies both breast   CARDIAC CATHETERIZATION  08/30/2010   CESAREAN SECTION     x 2    CHOLECYSTECTOMY     COLONOSCOPY     FOOT SURGERY Left    spur removal   HERNIA REPAIR     umbilical hernia repaired at the same time as cholecystectomy   IR FLUORO GUIDE CV LINE RIGHT  04/12/2021   IR US  GUIDE VASC ACCESS RIGHT  04/12/2021   JOINT REPLACEMENT     bilat knee    KNEE SURGERY Bilateral    x 2 joint knee replacements   KYPHOPLASTY N/A 03/28/2022   Procedure: LUMBAR ONE  KYPHOPLASTY;  Surgeon: Beuford Anes, MD;  Location: [facility];  Service: Orthopedics;  Laterality: N/A;  LUMBAR ONE KYPHOPLASTY   REVERSE SHOULDER ARTHROPLASTY Left 03/06/2024   Procedure: ARTHROPLASTY, SHOULDER, TOTAL, REVERSE;  Surgeon: Dozier Soulier, MD;  Location: [facility];  Service: Orthopedics;  Laterality: Left;   SHOULDER ARTHROSCOPY Right 12/18/2022   Procedure: RIGHT SHOULDER ARTHROSCOPY, ACROMIOPLASTY AND DEBRIDEMENT;  Surgeon: Sheril Coy, MD;  Location: [facility];  Service: Orthopedics;  Laterality: Right;   TOTAL HIP ARTHROPLASTY Left 10/26/2022   Procedure: TOTAL HIP ARTHROPLASTY ANTERIOR APPROACH;  Surgeon: Yvone Rush, MD;  Location: [facility];  Service: Orthopedics;  Laterality: Left;   TOTAL KNEE REVISION Left 10/03/2021   Procedure: LEFT TOTAL KNEE REVISION POLY SWAP WITH IRRIGATION AND DEBRIDEMENT;  Surgeon: Sheril Coy, MD;  Location: [facility];  Service: Orthopedics;  Laterality: Left;   UPPER GASTROINTESTINAL ENDOSCOPY     WISDOM TOOTH EXTRACTION     Patient Active Problem List   Diagnosis Date Noted   S/P reverse total  shoulder arthroplasty, left 03/06/2024   Acute on chronic anemia 03/05/2024   OP (osteoporosis) 10/21/2023   Moderate persistent asthma 10/26/2022   Stage 3a chronic kidney disease (CKD) (HCC) 04/06/2022   Microcytic anemia 03/11/2022   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/16/2020   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/2020   Psoriatic arthritis (HCC) 07/08/2017   Mild neurocognitive disorder 10/18/2016   DM2 (diabetes mellitus, type 2) (HCC) 12/04/2013   Benign essential HTN 04/21/2013   Hyperlipidemia 01/09/2008   OBESITY 01/09/2008   GERD with esophageal stricture and stenosis s/p dilation  01/09/2008   HERNIA, VENTRAL 01/09/2008   Anxiety and depression 03/25/2007    PCP: Madelin Brought  REFERRING PROVIDER: Eva Herring  REFERRING DIAG: Lt reverse TSA  THERAPY DIAG:  S/P reverse total shoulder arthroplasty,  left  Stiffness of left shoulder, not elsewhere classified  Muscle weakness (generalized)  Rationale for Evaluation and Treatment: Rehabilitation  ONSET DATE: 03/06/24  SUBJECTIVE:                                                                                                                                                                                      SUBJECTIVE STATEMENT: My shoulder is sore after the last visit. The doctor said I was doing good yesterday. The R one is giving me issues now. My legs are also giving me trouble.   PERTINENT HISTORY: Bilateral knee replacements, L total knee revision 2022 2023 kyphoplasty L1  L THA 2024  PAIN:  Are you having pain? 7/10 R shoulder   PRECAUTIONS: Shoulder lifting over 5#  RED FLAGS: None   WEIGHT BEARING RESTRICTIONS: No  FALLS:  Has patient fallen in last 6 months? Yes. Number of falls 1  LIVING ENVIRONMENT: Lives with: lives alone Lives in: House/apartment Stairs: Yes: External: 2 steps; can reach both  OCCUPATION: Disability   PLOF: Independent  PATIENT GOALS: get back to normal  NEXT MD VISIT:   OBJECTIVE:  Note: Objective measures were completed at Evaluation unless otherwise noted.  DIAGNOSTIC FINDINGS:  Acute displaced proximal left humerus surgical neck fracture.  IMPRESSION: 1. Surgical neck fracture left proximal humerus with shaft 2.1 cm lateral/proximal to the humeral head fragment. Apex anterior angulation between the major fracture fragments. Moderate comminution within the anterior fracture fragment extending obliquely into the left humeral metadiaphysis. This anterior fracture extends up towards the lesser tuberosity but a separate lesser tuberosity fragment is not well seen. There is also some mild comminution laterally along the surgical neck fracture site. 2. Greater tuberosity fracture displaced only 0.4 cm. 3. Although technically a Neer 2 part fracture, the comminution with  intermediary fragments along  the humeral metadiaphysis may indicate greater complexity than is typical. 4. Indistinctness of tissue planes and local hematoma tracking along musculature adjacent to the fracture, most notably along the biceps. 5. Old healed left rib fractures. 6.  Aortic Atherosclerosis (ICD10-I70.0).   COGNITION: Overall cognitive status: Within functional limits for tasks assessed     SENSATION: WFL  POSTURE: Rounded shoulders, R shoulder is slightly hiked   UPPER EXTREMITY ROM:   Active ROM Right eval Left eval  Shoulder flexion  65 p!  Shoulder extension    Shoulder abduction  55 p!  Shoulder adduction    Shoulder internal rotation  To gluteal fold  Shoulder external rotation  C1  Elbow flexion    Elbow extension    Wrist flexion    Wrist extension    Wrist ulnar deviation    Wrist radial deviation    Wrist pronation    Wrist supination    (Blank rows = not tested)  UPPER EXTREMITY MMT:  MMT Right eval Left eval  Shoulder flexion  2-  Shoulder extension    Shoulder abduction  2-  Shoulder adduction    Shoulder internal rotation  2-  Shoulder external rotation  2-  Middle trapezius    Lower trapezius    Elbow flexion  5  Elbow extension  5  Wrist flexion    Wrist extension    Wrist ulnar deviation    Wrist radial deviation    Wrist pronation    Wrist supination    Grip strength (lbs)    (Blank rows = not tested)  SHOULDER SPECIAL TESTS: Rotator cuff assessment: Drop arm test: positive , Empty can test: positive , and Full can test: positive  Biceps assessment: Speed's test: negative  JOINT MOBILITY TESTING:  PROM all directions, muscle guards above 90d and has pain  PALPATION:  No TTP, was advised about cross friction massage to prevent scar tissue build up                                                                                                                              TREATMENT DATE:  05/28/24 NuStep  L5x70mins AAROM 1# WaTE flexion, ext, IR 2x10 Rows and ext red 2x10 ER/IR with yellow band 2x10 Wall slides x10 PROM with end range holds, joint mobs posterior and inferior   05/26/24 UBE L 1 x 2.5 min each  AAROM 1lb WaTE flex, Ext, IR x10 Row & Ext red 2x10 PROM  to LUE with end range holds   GH Jt mobs  05/15/24 Pulleys flexion and abd 2 mins each way  AAROM with dowel all directions x10  Isometrics with ball against wall 5s holds  PROM with end range holds  Sidelying ER x10, then 1# x10 Yellow band ext and rows 2x10  05/08/23- EVAL, HEP   PATIENT EDUCATION: Education details: POC, HEP Person educated: Patient Education method: Medical illustrator Education comprehension: verbalized understanding and returned demonstration  HOME EXERCISE PROGRAM: Access Code: [email or web address] URL: [email or web address] Date: 05/07/2024 Prepared by: Almetta Fam  Exercises - Standing Shoulder Flexion AAROM with Dowel  - 1 x daily - 7 x weekly - 2 sets - 10 reps - Standing Shoulder Extension with Dowel  - 1 x daily - 7 x weekly - 2 sets - 10 reps - Standing Bilateral Shoulder Internal Rotation AAROM with Dowel  - 1 x daily - 7 x weekly - 2 sets - 10 reps - Seated Shoulder External Rotation AAROM with Dowel  - 1 x daily - 7 x weekly - 2 sets - 10 reps - Standing Shoulder Abduction AAROM with Dowel  - 1 x daily - 7 x weekly - 2 sets - 10 reps - Circular Shoulder Pendulum with Table Support  - 1 x daily - 7 x weekly - 2 sets - 10 reps  ASSESSMENT:  CLINICAL IMPRESSION: Patient is a 68 y.o. female who was seen today for physical therapy treatment for Lt reverse TSA. She is currently 11 weeks post op but not at the appropriate functional level, although her surgeon seemed to be pleased with it at this point. LUE ROM is limited, postural compensation with rows and extensions. Frequent cues needed to relax during PROM due to guarding but am able to get into further ranges today.      OBJECTIVE IMPAIRMENTS: decreased ROM, decreased strength, impaired UE functional use, and improper body mechanics.   ACTIVITY LIMITATIONS: carrying, lifting, reach over head, and hygiene/grooming  PARTICIPATION LIMITATIONS: cleaning, laundry, shopping, community activity, and yard work  [facility] POTENTIAL: Good  CLINICAL DECISION MAKING: Stable/uncomplicated  EVALUATION COMPLEXITY: Low  GOALS: Goals reviewed with patient? Yes  SHORT TERM GOALS: Target date: 06/18/24  Patient will be independent with initial HEP.  Baseline:  Goal status: Met 05/25/24  2.  Patient will increase L shoulder flexion and abduction ROM to 100d  Baseline: 65 flex, 55 abd Goal status: ongoing 05/26/24   LONG TERM GOALS: Target date: 07/30/24  Patient will be independent with advanced/ongoing HEP to improve outcomes and carryover.  Baseline:  Goal status: INITIAL  2.  Patient to improve L shoulder AROM to [facility] without pain provocation to allow for increased ease of ADLs.  Baseline:  Goal status: INITIAL  3.  Patient will demonstrate improved functional UE strength as demonstrated by 4/5 in all muscle groups. Baseline:  Goal status: INITIAL  4.  Patient will be able to reach over head to do her hair without difficulty    Baseline:  Goal status: INITIAL   PLAN:  PT FREQUENCY: 2x/week  PT DURATION: 12 weeks  PLANNED INTERVENTIONS: 97110-Therapeutic exercises, 97530- Therapeutic activity, [id number]- Neuromuscular re-education, 97535- Self Care, 02859- Manual therapy, 97016- Vasopneumatic device, 79439 (1-2 muscles), 20561 (3+ muscles)- Dry Needling, Patient/Family education, Joint mobilization, Spinal mobilization, Cryotherapy, and Moist heat  PLAN FOR NEXT SESSION: pulleys, AAROM, PROM, light strengthening as tolerated  Following Dr. Melita protocol  -Week 6-8: improve shoulder strength, gradually progress AAROM, AROM and PROM in all planes, begin pulley ROM, pendelums, begin isometrics   -Weeks  8-12: progress PROM and AROM, add AROM exercises sidelying, start resistance with bands   Almetta Fam, PT 05/28/2024, 1:51 PM

## 2024-05-28 ENCOUNTER — Ambulatory Visit

## 2024-05-28 DIAGNOSIS — Z96612 Presence of left artificial shoulder joint: Secondary | ICD-10-CM | POA: Diagnosis not present

## 2024-05-28 DIAGNOSIS — M25612 Stiffness of left shoulder, not elsewhere classified: Secondary | ICD-10-CM

## 2024-05-28 DIAGNOSIS — M6281 Muscle weakness (generalized): Secondary | ICD-10-CM

## 2024-06-02 ENCOUNTER — Encounter: Payer: Self-pay | Admitting: Physical Therapy

## 2024-06-02 ENCOUNTER — Ambulatory Visit: Admitting: Physical Therapy

## 2024-06-02 DIAGNOSIS — Z96612 Presence of left artificial shoulder joint: Secondary | ICD-10-CM

## 2024-06-02 DIAGNOSIS — M25612 Stiffness of left shoulder, not elsewhere classified: Secondary | ICD-10-CM

## 2024-06-02 DIAGNOSIS — M6281 Muscle weakness (generalized): Secondary | ICD-10-CM

## 2024-06-02 NOTE — Therapy (Signed)
 OUTPATIENT PHYSICAL THERAPY SHOULDER TREATMENT   Patient Name: Carrie [name] MRN: [id number] DOB:19-Dec-1955, 68 y.o., female Today's Date: 06/02/2024  END OF SESSION:  PT End of Session - 06/02/24 1259     Visit Number 5    Date for PT Re-Evaluation 07/30/24    PT Start Time 1300    PT Stop Time 1345    PT Time Calculation (min) 45 min    Activity Tolerance Patient tolerated treatment well    Behavior During Therapy WFL for tasks assessed/performed            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   Liver cirrhosis (HCC)    Nausea and vomiting 01/06/2013   Neuropathy    Bil feet re: to Diabetes   Obesity    Osteoporosis    Plantar fasciitis    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   Stricture and stenosis of esophagus    Stroke (HCC)    TIA (transient ischemic attack)    8-10 yrs ago, no problems since   Tuberculosis    tested positive 2011, no symptoms, was on medicine for 6 months   Past Surgical History:  Procedure Laterality Date   BREAST SURGERY Bilateral    biopsies both breast   CARDIAC CATHETERIZATION  08/30/2010   CESAREAN SECTION     x 2    CHOLECYSTECTOMY     COLONOSCOPY     FOOT SURGERY Left    spur removal   HERNIA REPAIR     umbilical hernia repaired at the same time as cholecystectomy   IR FLUORO GUIDE CV LINE RIGHT  04/12/2021   IR US  GUIDE VASC ACCESS RIGHT  04/12/2021   JOINT REPLACEMENT     bilat knee    KNEE SURGERY Bilateral    x 2 joint knee replacements   KYPHOPLASTY N/A 03/28/2022   Procedure: LUMBAR ONE  KYPHOPLASTY;  Surgeon: Beuford Anes, MD;  Location: [facility];  Service: Orthopedics;  Laterality: N/A;  LUMBAR ONE KYPHOPLASTY   REVERSE SHOULDER ARTHROPLASTY Left 03/06/2024   Procedure: ARTHROPLASTY, SHOULDER, TOTAL, REVERSE;  Surgeon: Dozier Soulier, MD;  Location: [facility];  Service: Orthopedics;  Laterality: Left;   SHOULDER ARTHROSCOPY Right 12/18/2022   Procedure: RIGHT SHOULDER ARTHROSCOPY, ACROMIOPLASTY AND DEBRIDEMENT;  Surgeon: Sheril Coy, MD;  Location: [facility];  Service: Orthopedics;  Laterality: Right;   TOTAL HIP ARTHROPLASTY Left 10/26/2022   Procedure: TOTAL HIP ARTHROPLASTY ANTERIOR APPROACH;  Surgeon: Yvone Rush, MD;  Location: [facility];  Service: Orthopedics;  Laterality: Left;   TOTAL KNEE REVISION Left 10/03/2021   Procedure: LEFT TOTAL KNEE REVISION POLY SWAP WITH IRRIGATION AND DEBRIDEMENT;  Surgeon: Sheril Coy, MD;  Location: [facility];  Service: Orthopedics;  Laterality: Left;   UPPER GASTROINTESTINAL ENDOSCOPY     WISDOM TOOTH EXTRACTION     Patient Active Problem List   Diagnosis Date Noted   S/P reverse total  shoulder arthroplasty, left 03/06/2024   Acute on chronic anemia 03/05/2024   OP (osteoporosis) 10/21/2023   Moderate persistent asthma 10/26/2022   Stage 3a chronic kidney disease (CKD) (HCC) 04/06/2022   Microcytic anemia 03/11/2022   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/16/2020   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/2020   Psoriatic arthritis (HCC) 07/08/2017   Mild neurocognitive disorder 10/18/2016   DM2 (diabetes mellitus, type 2) (HCC) 12/04/2013   Benign essential HTN 04/21/2013   Hyperlipidemia 01/09/2008   OBESITY 01/09/2008   GERD with esophageal stricture and stenosis s/p dilation  01/09/2008   HERNIA, VENTRAL 01/09/2008   Anxiety and depression 03/25/2007    PCP: Madelin Brought  REFERRING PROVIDER: Eva Herring  REFERRING DIAG: Lt reverse TSA  THERAPY DIAG:  S/P reverse total shoulder arthroplasty,  left  Stiffness of left shoulder, not elsewhere classified  Muscle weakness (generalized)  Rationale for Evaluation and Treatment: Rehabilitation  ONSET DATE: 03/06/24  SUBJECTIVE:                                                                                                                                                                                      SUBJECTIVE STATEMENT: Moved real fast over the weekend, L shoulder has been real sore, could hardly move it yesterday  PERTINENT HISTORY: Bilateral knee replacements, L total knee revision 2022 2023 kyphoplasty L1  L THA 2024  PAIN:  Are you having pain? 3-4/10 L shoulder, If she moves it the wrong way  PRECAUTIONS: Shoulder lifting over 5#  RED FLAGS: None   WEIGHT BEARING RESTRICTIONS: No  FALLS:  Has patient fallen in last 6 months? Yes. Number of falls 1  LIVING ENVIRONMENT: Lives with: lives alone Lives in: House/apartment Stairs: Yes: External: 2 steps; can reach both  OCCUPATION: Disability   PLOF: Independent  PATIENT GOALS: get back to normal  NEXT MD VISIT:   OBJECTIVE:  Note: Objective measures were completed at Evaluation unless otherwise noted.  DIAGNOSTIC FINDINGS:  Acute displaced proximal left humerus surgical neck fracture.  IMPRESSION: 1. Surgical neck fracture left proximal humerus with shaft 2.1 cm lateral/proximal to the humeral head fragment. Apex anterior angulation between the major fracture fragments. Moderate comminution within the anterior fracture fragment extending obliquely into the left humeral metadiaphysis. This anterior fracture extends up towards the lesser tuberosity but a separate lesser tuberosity fragment is not well seen. There is also some mild comminution laterally along the surgical neck fracture site. 2. Greater tuberosity fracture displaced only 0.4 cm. 3. Although technically a Neer 2 part fracture, the comminution with intermediary fragments along the humeral  metadiaphysis may indicate greater complexity than is  typical. 4. Indistinctness of tissue planes and local hematoma tracking along musculature adjacent to the fracture, most notably along the biceps. 5. Old healed left rib fractures. 6.  Aortic Atherosclerosis (ICD10-I70.0).   COGNITION: Overall cognitive status: Within functional limits for tasks assessed     SENSATION: WFL  POSTURE: Rounded shoulders, R shoulder is slightly hiked   UPPER EXTREMITY ROM:   Active ROM Right eval Left eval  Shoulder flexion  65 p!  Shoulder extension    Shoulder abduction  55 p!  Shoulder adduction    Shoulder internal rotation  To gluteal fold  Shoulder external rotation  C1  Elbow flexion    Elbow extension    Wrist flexion    Wrist extension    Wrist ulnar deviation    Wrist radial deviation    Wrist pronation    Wrist supination    (Blank rows = not tested)  UPPER EXTREMITY MMT:  MMT Right eval Left eval  Shoulder flexion  2-  Shoulder extension    Shoulder abduction  2-  Shoulder adduction    Shoulder internal rotation  2-  Shoulder external rotation  2-  Middle trapezius    Lower trapezius    Elbow flexion  5  Elbow extension  5  Wrist flexion    Wrist extension    Wrist ulnar deviation    Wrist radial deviation    Wrist pronation    Wrist supination    Grip strength (lbs)    (Blank rows = not tested)  SHOULDER SPECIAL TESTS: Rotator cuff assessment: Drop arm test: positive , Empty can test: positive , and Full can test: positive  Biceps assessment: Speed's test: negative  JOINT MOBILITY TESTING:  PROM all directions, muscle guards above 90d and has pain  PALPATION:  No TTP, was advised about cross friction massage to prevent scar tissue build up                                                                                                                              TREATMENT DATE:  06/02/24 NuStep L4 x 6 mins AAROM dowel  flexion, ext, and IR  2x10 Rows and ext red 2x10 ER/IR with yellow band 2x10 Wall slides x10 PROM  to LUE with end range holds   GH Jt mobs  05/28/24 NuStep L5x52mins AAROM 1# WaTE flexion, ext, IR 2x10 Rows and ext red 2x10 ER/IR with yellow band 2x10 Wall slides x10 PROM with end range holds, joint mobs posterior and inferior   05/26/24 UBE L 1 x 2.5 min each  AAROM 1lb WaTE flex, Ext, IR x10 Row & Ext red 2x10 PROM  to LUE with end range holds   GH Jt mobs  05/15/24 Pulleys flexion and abd 2 mins each way  AAROM with dowel all directions x10  Isometrics with ball against wall 5s holds  PROM with end range holds  Sidelying ER x10, then 1# x10 Yellow  band ext and rows 2x10  05/08/23- EVAL, HEP   PATIENT EDUCATION: Education details: POC, HEP Person educated: Patient Education method: Medical illustrator Education comprehension: verbalized understanding and returned demonstration  HOME EXERCISE PROGRAM: Access Code: [email or web address] URL: [email or web address] Date: 05/07/2024 Prepared by: Almetta Fam  Exercises - Standing Shoulder Flexion AAROM with Dowel  - 1 x daily - 7 x weekly - 2 sets - 10 reps - Standing Shoulder Extension with Dowel  - 1 x daily - 7 x weekly - 2 sets - 10 reps - Standing Bilateral Shoulder Internal Rotation AAROM with Dowel  - 1 x daily - 7 x weekly - 2 sets - 10 reps - Seated Shoulder External Rotation AAROM with Dowel  - 1 x daily - 7 x weekly - 2 sets - 10 reps - Standing Shoulder Abduction AAROM with Dowel  - 1 x daily - 7 x weekly - 2 sets - 10 reps - Circular Shoulder Pendulum with Table Support  - 1 x daily - 7 x weekly - 2 sets - 10 reps  ASSESSMENT:  CLINICAL IMPRESSION: Patient is a 68 y.o. female who was seen today for physical therapy treatment for Lt reverse TSA. She is currently 11 weeks post op but not at the appropriate functional level, although her surgeon seemed to be pleased with it at this point. LUE ROM and strength is limited,  postural compensation with rows and extensions. Frequent cues needed to relax during PROM due to guarding. Some pain at end rang with PROM.    OBJECTIVE IMPAIRMENTS: decreased ROM, decreased strength, impaired UE functional use, and improper body mechanics.   ACTIVITY LIMITATIONS: carrying, lifting, reach over head, and hygiene/grooming  PARTICIPATION LIMITATIONS: cleaning, laundry, shopping, community activity, and yard work  [facility] POTENTIAL: Good  CLINICAL DECISION MAKING: Stable/uncomplicated  EVALUATION COMPLEXITY: Low  GOALS: Goals reviewed with patient? Yes  SHORT TERM GOALS: Target date: 06/18/24  Patient will be independent with initial HEP.  Baseline:  Goal status: Met 05/25/24  2.  Patient will increase L shoulder flexion and abduction ROM to 100d  Baseline: 65 flex, 55 abd Goal status: ongoing 05/26/24   LONG TERM GOALS: Target date: 07/30/24  Patient will be independent with advanced/ongoing HEP to improve outcomes and carryover.  Baseline:  Goal status: INITIAL  2.  Patient to improve L shoulder AROM to [facility] without pain provocation to allow for increased ease of ADLs.  Baseline:  Goal status: ongoing 06/02/24  3.  Patient will demonstrate improved functional UE strength as demonstrated by 4/5 in all muscle groups. Baseline:  Goal status: INITIAL  4.  Patient will be able to reach over head to do her hair without difficulty    Baseline:  Goal status: INITIAL   PLAN:  PT FREQUENCY: 2x/week  PT DURATION: 12 weeks  PLANNED INTERVENTIONS: 97110-Therapeutic exercises, 97530- Therapeutic activity, 97112- Neuromuscular re-education, 97535- Self Care, [id number]- Manual therapy, [id number]- Vasopneumatic device, 79439 (1-2 muscles), 20561 (3+ muscles)- Dry Needling, Patient/Family education, Joint mobilization, Spinal mobilization, Cryotherapy, and Moist heat  PLAN FOR NEXT SESSION: pulleys, AAROM, PROM, light strengthening as tolerated  Following Dr. Melita protocol   -Week 6-8: improve shoulder strength, gradually progress AAROM, AROM and PROM in all planes, begin pulley ROM, pendelums, begin isometrics   -Weeks 8-12: progress PROM and AROM, add AROM exercises sidelying, start resistance with bands   Tanda KANDICE Sorrow, PTA 06/02/2024, 12:59 PM

## 2024-06-04 ENCOUNTER — Ambulatory Visit: Admitting: Physical Therapy

## 2024-06-08 ENCOUNTER — Encounter: Payer: Self-pay | Admitting: Internal Medicine

## 2024-06-08 ENCOUNTER — Other Ambulatory Visit (HOSPITAL_COMMUNITY): Payer: Self-pay

## 2024-06-08 ENCOUNTER — Telehealth: Payer: Self-pay | Admitting: Pharmacy Technician

## 2024-06-08 ENCOUNTER — Ambulatory Visit (INDEPENDENT_AMBULATORY_CARE_PROVIDER_SITE_OTHER): Admitting: Internal Medicine

## 2024-06-08 ENCOUNTER — Encounter: Payer: Self-pay | Admitting: Pharmacy Technician

## 2024-06-08 VITALS — BP 120/64 | HR 102 | Ht 60.0 in | Wt 146.8 lb

## 2024-06-08 DIAGNOSIS — E1165 Type 2 diabetes mellitus with hyperglycemia: Secondary | ICD-10-CM

## 2024-06-08 DIAGNOSIS — Z794 Long term (current) use of insulin: Secondary | ICD-10-CM

## 2024-06-08 DIAGNOSIS — E65 Localized adiposity: Secondary | ICD-10-CM | POA: Diagnosis not present

## 2024-06-08 DIAGNOSIS — E785 Hyperlipidemia, unspecified: Secondary | ICD-10-CM

## 2024-06-08 LAB — POCT GLYCOSYLATED HEMOGLOBIN (HGB A1C): Hemoglobin A1C: 7 % — AB (ref 4.0–5.6)

## 2024-06-08 MED ORDER — FREESTYLE LIBRE 3 PLUS SENSOR MISC: 1.0000 | 3 refills | Status: AC

## 2024-06-08 MED ORDER — FREESTYLE LIBRE 3 READER DEVI: 1.0000 | Freq: Once | 0 refills | Status: AC

## 2024-06-08 NOTE — Telephone Encounter (Signed)
 error

## 2024-06-08 NOTE — Telephone Encounter (Signed)
 Pharmacy Patient Advocate Encounter   Received notification from CoverMyMeds that prior authorization for FreeStyle Libre 3 Plus Sensor  is required/requested.   Insurance verification completed.   The patient is insured through [facility] .   Per test claim: PA required; PA submitted to above mentioned insurance via Latent Key/confirmation #/EOC [id number] Status is pending

## 2024-06-08 NOTE — Patient Instructions (Addendum)
 Please continue: - Metformin  ER 500 mg 2x a day - Ozempic  1 mg weekly   Start the sensor.   Please return in 1.5-2 months.

## 2024-06-08 NOTE — Progress Notes (Signed)
 Patient ID: Carrie [name], female   DOB: [birth year], 68 y.o.   MRN: [id number]  HPI: Carrie [name] is a 68 y.o.-year-old female, returning for follow-up for DM2, dx in 2010, non-insulin -dependent, controlled, with complications (TIA, DR, CKD, peripheral neuropathy, h/o gastroparesis). Pt. previously saw Dr. Kassie, last visit 3.5 months ago.  Interim history: No increased urination, blurry vision, nausea, chest pain.   She fell and fractured humerus >> had to have a shoulder replacement >> in PT.  She is on Prednisone  for joint swelling for her psoriatic arthritis as her insurance is not approving her medications. She stopped sweat tea since last visit.  However, she is eating out more now as her refrigerator is broken.  Reviewed HbA1c: Lab Results  Component Value Date   HGBA1C 8.2 (A) 02/20/2024   HGBA1C 7.9 (A) 10/23/2023   HGBA1C 6.4 (A) 06/21/2023   HGBA1C 6.8 (A) 02/14/2023   HGBA1C 6.3 (H) 12/14/2022   HGBA1C 6.0 (H) 06/25/2022   HGBA1C 5.8 (A) 01/16/2022   HGBA1C 6.1 (A) 09/21/2021   HGBA1C 6.0 (A) 03/16/2021   HGBA1C 6.2 (A) 09/20/2020   Pt is on a regimen of: - Metformin  500 mg daily - Ozempic  1 mg weekly >>  Trulicity  3 mg weekly - changed by rheumatology 2/2 signif. Weight loss (lowest weight 118 lbs) >> restarted Ozempic  0.5 >> 1 mg weekly - ran out >1 mo ago She had vaginitis from [employer]ok[employer]. She was on multiple insulin  injections 2011-2021 but had problems forgetting them - per Dr. Laymond note.  Pt was checking  her sugars 2x a day: - am:  90s-112 >> 120-130 () >> 110-117 >> 130-170 >> 100-120, but on Pred.: 167-200 - 2h after b'fast: n/c - before lunch: n/c - 2h after lunch: n/c >> 200s >> n/c - before dinner: n/c - 2h after dinner: 140-150s, 200 >> 112-117 >> n/c >> 200s >> 200-300 - bedtime: n/c - nighttime: n/c Lowest sugar was Lo x1 >> ... 130 >> 100; she has hypoglycemia awareness at 60s.  Highest sugar was  200s > 300 (eating out now that her  refrigerator is broken).  Glucometer: Accu-Chek  - + CKD - seeing nephrology, last BUN/creatinine:  03/07/2024: 23/1.07, GFR 61  Lab Results  Component Value Date   BUN 23 03/07/2024   BUN 19 03/06/2024   CREATININE 1.07 (H) 03/07/2024   CREATININE 1.10 (H) 03/06/2024   ACR:   No results found for: MICRALBCREAT On Cozaar  100 mg daily.  -+ HL; last set of lipids:  Lab Results  Component Value Date   CHOL 139 06/25/2022   HDL 36 (L) 06/25/2022   LDLCALC 73 06/25/2022   TRIG 150 (H) 06/25/2022   CHOLHDL 3.9 06/25/2022  On pravastatin  40 mg daily.  - last eye exam was in 2024. 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 She sees a retinal specialist - on IO injections.   - + numbness and tingling in her feet. She sees neurology >> on Gabapentin  - still symptomatic >> tapered it off >> Cymbalta  >> helps.  Last foot exam 06/21/2023.  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 She had total hip replacement on 10/26/2022 after a femoral fracture.  She has osteoporosis and is on Prolia . She also had right shoulder arthroscopy 12/18/2022. She was in the emergency room with acute cystitis with hematuria 05/15/2023.  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   Liver cirrhosis (HCC)    Nausea and vomiting 01/06/2013   Neuropathy    Bil feet re: to Diabetes   Obesity    Osteoporosis    Plantar fasciitis    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   Stricture and stenosis of esophagus    Stroke (HCC)    TIA (transient ischemic attack)    8-10 yrs ago, no problems since   Tuberculosis    tested positive 2011, no symptoms, was on medicine for 6 months   Past Surgical History:  Procedure Laterality Date   BREAST SURGERY Bilateral    biopsies both breast   CARDIAC CATHETERIZATION  08/30/2010   CESAREAN SECTION     x 2    CHOLECYSTECTOMY     COLONOSCOPY     FOOT SURGERY Left    spur removal   HERNIA REPAIR     umbilical hernia repaired at the same time as cholecystectomy   IR FLUORO GUIDE CV LINE RIGHT  04/12/2021   IR US  GUIDE VASC ACCESS RIGHT  04/12/2021   JOINT REPLACEMENT     bilat knee    KNEE SURGERY Bilateral    x 2 joint knee replacements   KYPHOPLASTY N/A 03/28/2022   Procedure: LUMBAR ONE KYPHOPLASTY;  Surgeon: Beuford Anes, MD;  Location: [facility];  Service: Orthopedics;  Laterality: N/A;  LUMBAR ONE KYPHOPLASTY   REVERSE SHOULDER ARTHROPLASTY Left 03/06/2024   Procedure: ARTHROPLASTY, SHOULDER, TOTAL, REVERSE;  Surgeon: Dozier Soulier, MD;  Location: [facility];  Service: Orthopedics;  Laterality: Left;   SHOULDER ARTHROSCOPY Right 12/18/2022   Procedure: RIGHT SHOULDER ARTHROSCOPY, ACROMIOPLASTY AND DEBRIDEMENT;  Surgeon: Sheril Coy, MD;  Location: [facility];  Service: Orthopedics;  Laterality: Right;   TOTAL HIP ARTHROPLASTY Left 10/26/2022   Procedure: TOTAL HIP ARTHROPLASTY ANTERIOR APPROACH;  Surgeon: Yvone Rush, MD;  Location: [facility];  Service: Orthopedics;  Laterality: Left;   TOTAL KNEE REVISION Left 10/03/2021   Procedure: LEFT TOTAL KNEE REVISION POLY SWAP WITH IRRIGATION AND DEBRIDEMENT;  Surgeon: Sheril Coy, MD;  Location: [facility];  Service: Orthopedics;  Laterality: Left;   UPPER GASTROINTESTINAL ENDOSCOPY     WISDOM TOOTH EXTRACTION     Social History   Socioeconomic History   Marital status: Divorced    Spouse name: Not on file   Number of children: 2    Years of education: Not on file   Highest education level: Not on file  Occupational History   Occupation: Disabled   Tobacco Use   Smoking status: Never    Passive exposure: Never   Smokeless tobacco: Never  Vaping Use   Vaping status: Never Used  Substance and Sexual Activity   Alcohol use: Not Currently   Drug use: No   Sexual activity: Not Currently    Birth control/protection: Post-menopausal  Other Topics Concern   Not on file  Social History Narrative   Sodas daily    Social Drivers of Health   Financial Resource Strain: Not on file  Food Insecurity: No Food Insecurity (03/06/2024)   Hunger Vital Sign    Worried About Running Out of Food in the Last Year: Never true    Ran Out of Food in the Last Year: Never true  Transportation Needs: No Transportation Needs (03/06/2024)   PRAPARE - Administrator, Civil Service (Medical): No    Lack of Transportation (Non-Medical): No  Physical Activity: Not on file  Stress: Not on file  Social Connections: Unknown (03/06/2024)   Social Connection and Isolation Panel    Frequency of Communication with Friends and Family: More than three times a week    Frequency of Social Gatherings with Friends and Family: More than three times a week    Attends Religious Services: Patient unable to answer    Active Member of Clubs or Organizations: Patient unable to answer    Attends Banker Meetings: Patient unable to answer    Marital Status: Patient unable to answer  Intimate Partner Violence: Not At Risk (03/06/2024)   Humiliation, Afraid, Rape, and Kick questionnaire    Fear of Current or Ex-Partner: No    Emotionally Abused: No    Physically Abused: No    Sexually Abused: No   Current Outpatient Medications on File Prior to Visit  Medication Sig Dispense Refill   Accu-Chek Softclix Lancets lancets Use as instructed to check blood sugar 1-2 times daily Dx E11.9 200 each 3   acetaminophen  (TYLENOL ) 500 MG tablet  Take 1,000 mg by mouth every 6 (six) hours as needed for moderate pain (pain score 4-6).     albuterol  (VENTOLIN  HFA) 108 (90 Base) MCG/ACT inhaler Inhale 2 puffs into the lungs every 4 (four) hours as needed for wheezing or shortness of breath (or coughing).     amLODipine  (NORVASC ) 2.5 MG tablet Take 2.5 mg by mouth daily.     aspirin  EC 81 MG tablet Take 1 tablet (81 mg total) by mouth 2 (two) times daily. Swallow whole.     Blood Glucose Monitoring Suppl (ACCU-CHEK GUIDE ME) w/Device KIT USE AS INSTRUCTED TO CHECK BLOOD SUGAR 2 TIMES DAILY DX E11.9 1 kit 0   busPIRone  (BUSPAR ) 5 MG tablet Take 10 mg by mouth at bedtime.     Calcium  Carb-Cholecalciferol (CALCIUM  CARBONATE-VITAMIN D3 PO) Take 1 tablet by mouth daily.     chlorhexidine  (HIBICLENS ) 4 % external liquid Apply 15 mLs (1 Application total) topically as directed for 30 doses. Use as directed daily for 5 days every other week for 6 weeks. 946 mL 1   clotrimazole-betamethasone (LOTRISONE) cream Apply 1 Application topically 2 (two) times daily as needed (rash).     denosumab  (PROLIA ) 60 MG/ML SOSY injection Inject 60 mg into the skin every 6 (six) months.     diltiazem  (CARDIZEM  CD) 240 MG 24 hr capsule Take 240 mg by mouth daily.     DULoxetine  (CYMBALTA ) 60 MG capsule Take 60 mg by mouth daily.     ferrous sulfate  325 (65 FE) MG EC tablet Take 325 mg by mouth daily.     glucose blood (ACCU-CHEK GUIDE TEST) test strip USE 1 STRIP 2 (TWO) TIMES DAILY. 200 strip 5   losartan  (COZAAR ) 100 MG tablet TAKE 1 TABLET BY MOUTH EVERY DAY IN THE MORNING 90 tablet 1   Melatonin 12 MG TABS Take 12 mg by mouth at  bedtime as needed (sleep).     metFORMIN  (GLUCOPHAGE -XR) 500 MG 24 hr tablet Take 1 tablet (500 mg total) by mouth at bedtime. (Patient taking differently: Take 500 mg by mouth 2 (two) times daily.) 90 tablet 3   mirabegron  ER (MYRBETRIQ ) 25 MG TB24 tablet Take 25 mg by mouth daily.     omeprazole (PRILOSEC) 40 MG capsule Take 40 mg by mouth  2 (two) times daily.     ondansetron  (ZOFRAN ) 4 MG tablet Take 1 tablet (4 mg total) by mouth every 6 (six) hours as needed for nausea. 30 tablet 0   oxyCODONE  (ROXICODONE ) 5 MG immediate release tablet Take 1-2 tablets (5-10 mg total) by mouth every 4 (four) hours as needed. 30 tablet 0   pravastatin  (PRAVACHOL ) 80 MG tablet Take 80 mg by mouth daily.     primidone  (MYSOLINE ) 250 MG tablet Take 125 mg by mouth 2 (two) times daily.     QVAR REDIHALER 80 MCG/ACT inhaler Inhale 1 puff into the lungs 2 (two) times daily.     Semaglutide , 1 MG/DOSE, (OZEMPIC , 1 MG/DOSE,) 4 MG/3ML SOPN Inject 1 mg into the skin once a week. 9 mL 3   STELARA 45 MG/0.5ML injection Inject 45 mg into the skin every 3 (three) months.     Current Facility-Administered Medications on File Prior to Visit  Medication Dose Route Frequency Provider Last Rate Last Admin   denosumab  (PROLIA ) injection 60 mg  60 mg Subcutaneous Once Ishmael Slough, MD       Allergies  Allergen Reactions   Lisinopril Cough   Family History  Problem Relation Age of Onset   Diabetes Other        maternal aunts and uncles   Diabetes Father    Liver disease Father    Emphysema Father        smoked   Diabetes Sister    Diabetes Brother    Sarcoidosis Brother    Emphysema Mother        smoked   Asthma Mother    Asthma Brother    Colon cancer Neg Hx    Esophageal cancer Neg Hx    Stomach cancer Neg Hx    Rectal cancer Neg Hx    PE: BP 120/64   Pulse (!) 102   Ht 5' (1.524 m)   Wt 146 lb 12.8 oz (66.6 kg)   LMP  (LMP Unknown)   SpO2 93%   BMI 28.67 kg/m  Wt Readings from Last 3 Encounters:  06/08/24 146 lb 12.8 oz (66.6 kg)  04/03/24 151 lb 3.2 oz (68.6 kg)  03/06/24 150 lb (68 kg)   Constitutional: overweight, in NAD Eyes:  EOMI, no exophthalmos ENT: no neck masses, no cervical lymphadenopathy Cardiovascular: Tachycardia, RR, No MRG Respiratory: CTA bilaterally Musculoskeletal: no deformities Skin:no  rashes Neurological: no tremor with outstretched hands Diabetic Foot Exam - Simple   Simple Foot Form Diabetic Foot exam was performed with the following findings: Yes 06/08/2024  2:13 PM  Visual Inspection No deformities, no ulcerations, no other skin breakdown bilaterally: Yes Sensation Testing Intact to touch and monofilament testing bilaterally: Yes Pulse Check Posterior Tibialis and Dorsalis pulse intact bilaterally: Yes Comments Absent R hallux toenail    ASSESSMENT: 1. DM2, non-insulin -dependent, controlled, with complications - DR - H/o TIA - CKD-sees nephrology - PN - H/o Gastroparesis - resolved  2. HL  3.  Central obesity  PLAN:  1. Patient with longstanding, uncontrolled type 2 diabetes, on metformin  and weekly  GLP-1/GIP receptor agonist, with started at last visit.  At that time she was off Ozempic  for 1 month.  She mentioned that she was told by the pharmacist that I did not approve her Ozempic  refills despite the fact that at the previous visit I sent a prescription for 3 months with 3 refills... I sent a new refill, and I also gave her a written prescription.  I advised her to try to increase metformin  to twice a day to hopefully improve her blood sugars, since they were elevated from prior.  I also recommended to check blood sugars rotating checks throughout the day. -At today's visit, sugars at home are high especially after dinner, but she is not checking consistently.  Sugars have been higher while on prednisone , but overall, HbA1c is much better at today's visit.  I do not have enough data to decide whether or not she needs insulin  for now.  We discussed about possibly starting CGM and after an explanation about how to use it and where to wear it, she agrees to try this.  I will have her come back in 1.5 to 2 months and at that time we will need to decide if a change in regimen is needed.  I did advise her that if she is not able to start the CGM, to check blood  sugars more frequently and to bring them at next visit. - I suggested to:  Patient Instructions  Please continue: - Metformin  ER 500 mg 2x a day - Ozempic  1 mg weekly   Start the sensor.   Please return in 1.5-2 months.  - we checked her HbA1c: 7.0% (lower) - advised to check sugars at different times of the day - 4x a day, rotating check times - advised for yearly eye exams >> she is UTD - return to clinic in 1.5-2 months  2. HL - Latest lipid panel was reviewed from 09/2023: LDL above our target of less than 55  -She continues atorvastatin  80 mg daily (dose increased 10/2023) without side effects  3.  Central obesity -She lost a significant amount of weight on Ozempic  before, down to 118 pounds.  We did discuss about switching from Ozempic  to Trulicity  but she gained 29 pounds back after doing so.  Her BMI is in the overweight range usually, but she has central obesity so we started back on Ozempic .  At last visit, I advised her to increase the dose to 1 mg weekly. - At last visit weight was approximately stable and she lost 4 pounds since then  Lela Fendt, MD PhD [facility] Endocrinology

## 2024-06-09 ENCOUNTER — Ambulatory Visit: Admitting: Physical Therapy

## 2024-06-09 ENCOUNTER — Encounter: Payer: Self-pay | Admitting: Physical Therapy

## 2024-06-09 DIAGNOSIS — Z96612 Presence of left artificial shoulder joint: Secondary | ICD-10-CM

## 2024-06-09 DIAGNOSIS — M6281 Muscle weakness (generalized): Secondary | ICD-10-CM

## 2024-06-09 DIAGNOSIS — M25612 Stiffness of left shoulder, not elsewhere classified: Secondary | ICD-10-CM

## 2024-06-09 NOTE — Therapy (Signed)
 OUTPATIENT PHYSICAL THERAPY SHOULDER TREATMENT   Patient Name: Carrie [name] MRN: [id number] DOB:[birth year], 68 y.o., female Today's Date: 06/09/2024  END OF SESSION:  PT End of Session - 06/09/24 1304     Visit Number 6    Date for PT Re-Evaluation 07/30/24    PT Start Time 1303    PT Stop Time 1345    PT Time Calculation (min) 42 min    Activity Tolerance Patient tolerated treatment well    Behavior During Therapy WFL for tasks assessed/performed            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   Liver cirrhosis (HCC)    Nausea and vomiting 01/06/2013   Neuropathy    Bil feet re: to Diabetes   Obesity    Osteoporosis    Plantar fasciitis    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   Stricture and stenosis of esophagus    Stroke (HCC)    TIA (transient ischemic attack)    8-10 yrs ago, no problems since   Tuberculosis    tested positive 2011, no symptoms, was on medicine for 6 months   Past Surgical History:  Procedure Laterality Date   BREAST SURGERY Bilateral    biopsies both breast   CARDIAC CATHETERIZATION  08/30/2010   CESAREAN SECTION     x 2    CHOLECYSTECTOMY     COLONOSCOPY     FOOT SURGERY Left    spur removal   HERNIA REPAIR     umbilical hernia repaired at the same time as cholecystectomy   IR FLUORO GUIDE CV LINE RIGHT  04/12/2021   IR US  GUIDE VASC ACCESS RIGHT  04/12/2021   JOINT REPLACEMENT     bilat knee    KNEE SURGERY Bilateral    x 2 joint knee replacements   KYPHOPLASTY N/A 03/28/2022   Procedure: LUMBAR ONE  KYPHOPLASTY;  Surgeon: Beuford Anes, MD;  Location: [facility];  Service: Orthopedics;  Laterality: N/A;  LUMBAR ONE KYPHOPLASTY   REVERSE SHOULDER ARTHROPLASTY Left 03/06/2024   Procedure: ARTHROPLASTY, SHOULDER, TOTAL, REVERSE;  Surgeon: Dozier Soulier, MD;  Location: [facility];  Service: Orthopedics;  Laterality: Left;   SHOULDER ARTHROSCOPY Right 12/18/2022   Procedure: RIGHT SHOULDER ARTHROSCOPY, ACROMIOPLASTY AND DEBRIDEMENT;  Surgeon: Sheril Coy, MD;  Location: [facility];  Service: Orthopedics;  Laterality: Right;   TOTAL HIP ARTHROPLASTY Left 10/26/2022   Procedure: TOTAL HIP ARTHROPLASTY ANTERIOR APPROACH;  Surgeon: Yvone Rush, MD;  Location: [facility];  Service: Orthopedics;  Laterality: Left;   TOTAL KNEE REVISION Left 10/03/2021   Procedure: LEFT TOTAL KNEE REVISION POLY SWAP WITH IRRIGATION AND DEBRIDEMENT;  Surgeon: Sheril Coy, MD;  Location: [facility];  Service: Orthopedics;  Laterality: Left;   UPPER GASTROINTESTINAL ENDOSCOPY     WISDOM TOOTH EXTRACTION     Patient Active Problem List   Diagnosis Date Noted   S/P reverse total  shoulder arthroplasty, left 03/06/2024   Acute on chronic anemia 03/05/2024   OP (osteoporosis) 10/21/2023   Moderate persistent asthma 10/26/2022   Stage 3a chronic kidney disease (CKD) (HCC) 04/06/2022   Microcytic anemia 03/11/2022   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/16/2020   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/2020   Psoriatic arthritis (HCC) 07/08/2017   Mild neurocognitive disorder 10/18/2016   DM2 (diabetes mellitus, type 2) (HCC) 12/04/2013   Benign essential HTN 04/21/2013   Hyperlipidemia 01/09/2008   OBESITY 01/09/2008   GERD with esophageal stricture and stenosis s/p dilation  01/09/2008   HERNIA, VENTRAL 01/09/2008   Anxiety and depression 03/25/2007    PCP: Madelin Brought  REFERRING PROVIDER: Eva Herring  REFERRING DIAG: Lt reverse TSA  THERAPY DIAG:  S/P reverse total shoulder arthroplasty,  left  Stiffness of left shoulder, not elsewhere classified  Muscle weakness (generalized)  Rationale for Evaluation and Treatment: Rehabilitation  ONSET DATE: 03/06/24  SUBJECTIVE:                                                                                                                                                                                      SUBJECTIVE STATEMENT: Its going ok  PERTINENT HISTORY: Bilateral knee replacements, L total knee revision 2022 2023 kyphoplasty L1  L THA 2024  PAIN:  Are you having pain? 3-4/10 L shoulder, If she moves it the wrong way  PRECAUTIONS: Shoulder lifting over 5#  RED FLAGS: None   WEIGHT BEARING RESTRICTIONS: No  FALLS:  Has patient fallen in last 6 months? Yes. Number of falls 1  LIVING ENVIRONMENT: Lives with: lives alone Lives in: House/apartment Stairs: Yes: External: 2 steps; can reach both  OCCUPATION: Disability   PLOF: Independent  PATIENT GOALS: get back to normal  NEXT MD VISIT:   OBJECTIVE:  Note: Objective measures were completed at Evaluation unless otherwise noted.  DIAGNOSTIC FINDINGS:  Acute displaced proximal left humerus surgical neck fracture.  IMPRESSION: 1. Surgical neck fracture left proximal humerus with shaft 2.1 cm lateral/proximal to the humeral head fragment. Apex anterior angulation between the major fracture fragments. Moderate comminution within the anterior fracture fragment extending obliquely into the left humeral metadiaphysis. This anterior fracture extends up towards the lesser tuberosity but a separate lesser tuberosity fragment is not well seen. There is also some mild comminution laterally along the surgical neck fracture site. 2. Greater tuberosity fracture displaced only 0.4 cm. 3. Although technically a Neer 2 part fracture, the comminution with intermediary fragments along the humeral metadiaphysis may indicate greater complexity than is typical. 4.  Indistinctness of tissue planes and local hematoma tracking along musculature adjacent to  the fracture, most notably along the biceps. 5. Old healed left rib fractures. 6.  Aortic Atherosclerosis (ICD10-I70.0).   COGNITION: Overall cognitive status: Within functional limits for tasks assessed     SENSATION: WFL  POSTURE: Rounded shoulders, R shoulder is slightly hiked   UPPER EXTREMITY ROM:   Active ROM Left eval Left 06/09/24  Shoulder flexion 65 p! 86  Shoulder extension    Shoulder abduction 55 p! 70  Shoulder adduction    Shoulder internal rotation To gluteal fold   Shoulder external rotation C1   Elbow flexion    Elbow extension    Wrist flexion    Wrist extension    Wrist ulnar deviation    Wrist radial deviation    Wrist pronation    Wrist supination    (Blank rows = not tested)  UPPER EXTREMITY MMT:  MMT Right eval Left eval  Shoulder flexion  2-  Shoulder extension    Shoulder abduction  2-  Shoulder adduction    Shoulder internal rotation  2-  Shoulder external rotation  2-  Middle trapezius    Lower trapezius    Elbow flexion  5  Elbow extension  5  Wrist flexion    Wrist extension    Wrist ulnar deviation    Wrist radial deviation    Wrist pronation    Wrist supination    Grip strength (lbs)    (Blank rows = not tested)  SHOULDER SPECIAL TESTS: Rotator cuff assessment: Drop arm test: positive , Empty can test: positive , and Full can test: positive  Biceps assessment: Speed's test: negative  JOINT MOBILITY TESTING:  PROM all directions, muscle guards above 90d and has pain  PALPATION:  No TTP, was advised about cross friction massage to prevent scar tissue build up                                                                                                                              TREATMENT DATE:  06/09/24 UBE L 1 x 2 min each AAROM 1lb  WaTE  flexion, ext, and IR x10 Rows & lats 15lb 2x10 Rows and ext yellow 2x10 Wall slides  x10 Shoulder abd and flex AROM 2x10  06/02/24 NuStep L4 x 6 mins AAROM dowel  flexion, ext, and IR 2x10 Rows and ext red 2x10 ER/IR with yellow band 2x10 Wall slides x10 PROM  to LUE with end range holds   GH Jt mobs  05/28/24 NuStep L5x25mins AAROM 1# WaTE flexion, ext, IR 2x10 Rows and ext red 2x10 ER/IR with yellow band 2x10 Wall slides x10 PROM with end range holds, joint mobs posterior and inferior   05/26/24 UBE L 1 x 2.5 min each  AAROM 1lb WaTE flex, Ext, IR x10 Row & Ext red 2x10 PROM  to LUE with end range holds   GH Jt mobs  05/15/24 Pulleys flexion and abd 2 mins each way  AAROM with dowel all  directions x10  Isometrics with ball against wall 5s holds  PROM with end range holds  Sidelying ER x10, then 1# x10 Yellow band ext and rows 2x10  05/08/23- EVAL, HEP   PATIENT EDUCATION: Education details: POC, HEP Person educated: Patient Education method: Medical illustrator Education comprehension: verbalized understanding and returned demonstration  HOME EXERCISE PROGRAM: Access Code: [email or web address] URL: [email or web address] Date: 05/07/2024 Prepared by: Almetta Fam  Exercises - Standing Shoulder Flexion AAROM with Dowel  - 1 x daily - 7 x weekly - 2 sets - 10 reps - Standing Shoulder Extension with Dowel  - 1 x daily - 7 x weekly - 2 sets - 10 reps - Standing Bilateral Shoulder Internal Rotation AAROM with Dowel  - 1 x daily - 7 x weekly - 2 sets - 10 reps - Seated Shoulder External Rotation AAROM with Dowel  - 1 x daily - 7 x weekly - 2 sets - 10 reps - Standing Shoulder Abduction AAROM with Dowel  - 1 x daily - 7 x weekly - 2 sets - 10 reps - Circular Shoulder Pendulum with Table Support  - 1 x daily - 7 x weekly - 2 sets - 10 reps  ASSESSMENT:  CLINICAL IMPRESSION: Patient is a 68 y.o. female who was seen today for physical therapy treatment for Lt reverse TSA. She is currently 11 to 12 weeks post op but not at the appropriate  functional level, although her surgeon seemed to be pleased with it at this point. She has progressed increasing her L shoulder AROM but it remains limited overall.  She did well with a progression to machine level interventions. postural compensation with rows and lats due to limited UE AROM.       OBJECTIVE IMPAIRMENTS: decreased ROM, decreased strength, impaired UE functional use, and improper body mechanics.   ACTIVITY LIMITATIONS: carrying, lifting, reach over head, and hygiene/grooming  PARTICIPATION LIMITATIONS: cleaning, laundry, shopping, community activity, and yard work  [facility] POTENTIAL: Good  CLINICAL DECISION MAKING: Stable/uncomplicated  EVALUATION COMPLEXITY: Low  GOALS: Goals reviewed with patient? Yes  SHORT TERM GOALS: Target date: 06/18/24  Patient will be independent with initial HEP.  Baseline:  Goal status: Met 05/25/24  2.  Patient will increase L shoulder flexion and abduction ROM to 100d  Baseline: 65 flex, 55 abd Goal status: ongoing 05/26/24   LONG TERM GOALS: Target date: 07/30/24  Patient will be independent with advanced/ongoing HEP to improve outcomes and carryover.  Baseline:  Goal status: INITIAL  2.  Patient to improve L shoulder AROM to [facility] without pain provocation to allow for increased ease of ADLs.  Baseline:  Goal status: ongoing 06/02/24  3.  Patient will demonstrate improved functional UE strength as demonstrated by 4/5 in all muscle groups. Baseline:  Goal status: INITIAL  4.  Patient will be able to reach over head to do her hair without difficulty    Baseline:  Goal status: INITIAL   PLAN:  PT FREQUENCY: 2x/week  PT DURATION: 12 weeks  PLANNED INTERVENTIONS: [id number]-Therapeutic exercises, 97530- Therapeutic activity, [id number]- Neuromuscular re-education, 97535- Self Care, [id number]- Manual therapy, [id number]- Vasopneumatic device, [id number] (1-2 muscles), 20561 (3+ muscles)- Dry Needling, Patient/Family education, Joint mobilization, Spinal  mobilization, Cryotherapy, and Moist heat  PLAN FOR NEXT SESSION: pulleys, AAROM, PROM, light strengthening as tolerated  Following Dr. Melita protocol  -Week 6-8: improve shoulder strength, gradually progress AAROM, AROM and PROM in all planes, begin pulley ROM, pendelums, begin isometrics   -Weeks 8-12: progress PROM  and AROM, add AROM exercises sidelying, start resistance with bands   Tanda KANDICE Sorrow, PTA 06/09/2024, 1:04 PM

## 2024-06-10 NOTE — Telephone Encounter (Signed)
 Pharmacy Patient Advocate Encounter  Received notification from [employer] that Prior Authorization for FreeStyle Libre 3 Plus Sensor  has been DENIED.  Full denial letter will be uploaded to the media tab. See denial reason below.  **Insurance requires her to take insulin  or have a history of severe hypoglycemic events.**   PA #/Case ID/Reference #: [id number]

## 2024-06-11 ENCOUNTER — Ambulatory Visit: Admitting: Physical Therapy

## 2024-06-12 ENCOUNTER — Ambulatory Visit: Admitting: Physical Therapy

## 2024-06-12 NOTE — Telephone Encounter (Signed)
 It looks like unfortunately she may need to pay out-of-pocket for the [facility] or try to send the Dexcom 7, maybe this is covered.[email or web address]

## 2024-06-17 ENCOUNTER — Ambulatory Visit: Attending: Family Medicine | Admitting: Physical Therapy

## 2024-06-17 ENCOUNTER — Encounter: Payer: Self-pay | Admitting: Physical Therapy

## 2024-06-17 DIAGNOSIS — Z96612 Presence of left artificial shoulder joint: Secondary | ICD-10-CM | POA: Insufficient documentation

## 2024-06-17 DIAGNOSIS — M25612 Stiffness of left shoulder, not elsewhere classified: Secondary | ICD-10-CM | POA: Insufficient documentation

## 2024-06-17 DIAGNOSIS — M6281 Muscle weakness (generalized): Secondary | ICD-10-CM | POA: Diagnosis present

## 2024-06-17 NOTE — Therapy (Signed)
 OUTPATIENT PHYSICAL THERAPY SHOULDER TREATMENT   Patient Name: Carrie [name] MRN: [id number] DOB:[birth year], 68 y.o., female Today's Date: 06/17/2024  END OF SESSION:  PT End of Session - 06/17/24 1142     Visit Number 7    Date for PT Re-Evaluation 07/30/24    PT Start Time 1143    PT Stop Time 1227    PT Time Calculation (min) 44 min    Activity Tolerance Patient tolerated treatment well    Behavior During Therapy [facility] for tasks assessed/performed            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   Liver cirrhosis (HCC)    Nausea and vomiting 01/06/2013   Neuropathy    Bil feet re: to Diabetes   Obesity    Osteoporosis    Plantar fasciitis    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   Stricture and stenosis of esophagus    Stroke (HCC)    TIA (transient ischemic attack)    8-10 yrs ago, no problems since   Tuberculosis    tested positive 2011, no symptoms, was on medicine for 6 months   Past Surgical History:  Procedure Laterality Date   BREAST SURGERY Bilateral    biopsies both breast   CARDIAC CATHETERIZATION  08/30/2010   CESAREAN SECTION     x 2    CHOLECYSTECTOMY     COLONOSCOPY     FOOT SURGERY Left    spur removal   HERNIA REPAIR     umbilical hernia repaired at the same time as cholecystectomy   IR FLUORO GUIDE CV LINE RIGHT  04/12/2021   IR US  GUIDE VASC ACCESS RIGHT  04/12/2021   JOINT REPLACEMENT     bilat knee    KNEE SURGERY Bilateral    x 2 joint knee replacements   KYPHOPLASTY N/A 03/28/2022   Procedure: LUMBAR ONE  KYPHOPLASTY;  Surgeon: Beuford Anes, MD;  Location: [facility] OR;  Service: Orthopedics;  Laterality: N/A;  LUMBAR ONE KYPHOPLASTY   REVERSE SHOULDER ARTHROPLASTY Left 03/06/2024   Procedure: ARTHROPLASTY, SHOULDER, TOTAL, REVERSE;  Surgeon: Dozier Soulier, MD;  Location: [facility];  Service: Orthopedics;  Laterality: Left;   SHOULDER ARTHROSCOPY Right 12/18/2022   Procedure: RIGHT SHOULDER ARTHROSCOPY, ACROMIOPLASTY AND DEBRIDEMENT;  Surgeon: Sheril Coy, MD;  Location: [facility];  Service: Orthopedics;  Laterality: Right;   TOTAL HIP ARTHROPLASTY Left 10/26/2022   Procedure: TOTAL HIP ARTHROPLASTY ANTERIOR APPROACH;  Surgeon: Yvone Rush, MD;  Location: [facility];  Service: Orthopedics;  Laterality: Left;   TOTAL KNEE REVISION Left 10/03/2021   Procedure: LEFT TOTAL KNEE REVISION POLY SWAP WITH IRRIGATION AND DEBRIDEMENT;  Surgeon: Sheril Coy, MD;  Location: [facility];  Service: Orthopedics;  Laterality: Left;   UPPER GASTROINTESTINAL ENDOSCOPY     WISDOM TOOTH EXTRACTION     Patient Active Problem List   Diagnosis Date Noted   S/P reverse total  shoulder arthroplasty, left 03/06/2024   Acute on chronic anemia 03/05/2024   OP (osteoporosis) 10/21/2023   Moderate persistent asthma 10/26/2022   Stage 3a chronic kidney disease (CKD) (HCC) 04/06/2022   Microcytic anemia 03/11/2022   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/16/2020   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/2020   Psoriatic arthritis (HCC) 07/08/2017   Mild neurocognitive disorder 10/18/2016   DM2 (diabetes mellitus, type 2) (HCC) 12/04/2013   Benign essential HTN 04/21/2013   Hyperlipidemia 01/09/2008   OBESITY 01/09/2008   GERD with esophageal stricture and stenosis s/p dilation  01/09/2008   HERNIA, VENTRAL 01/09/2008   Anxiety and depression 03/25/2007    PCP: Madelin Brought  REFERRING PROVIDER: Eva Herring  REFERRING DIAG: Lt reverse TSA  THERAPY DIAG:  S/P reverse total shoulder arthroplasty,  left  Muscle weakness (generalized)  Stiffness of left shoulder, not elsewhere classified  Rationale for Evaluation and Treatment: Rehabilitation  ONSET DATE: 03/06/24  SUBJECTIVE:                                                                                                                                                                                      SUBJECTIVE STATEMENT: It hurts a lot when she tries to do stuff  PERTINENT HISTORY: Bilateral knee replacements, L total knee revision 2022 2023 kyphoplasty L1  L THA 2024  PAIN:  Are you having pain? 0/10 L shoulder, If she moves it the wrong way  PRECAUTIONS: Shoulder lifting over 5#  RED FLAGS: None   WEIGHT BEARING RESTRICTIONS: No  FALLS:  Has patient fallen in last 6 months? Yes. Number of falls 1  LIVING ENVIRONMENT: Lives with: lives alone Lives in: House/apartment Stairs: Yes: External: 2 steps; can reach both  OCCUPATION: Disability   PLOF: Independent  PATIENT GOALS: get back to normal  NEXT MD VISIT:   OBJECTIVE:  Note: Objective measures were completed at Evaluation unless otherwise noted.  DIAGNOSTIC FINDINGS:  Acute displaced proximal left humerus surgical neck fracture.  IMPRESSION: 1. Surgical neck fracture left proximal humerus with shaft 2.1 cm lateral/proximal to the humeral head fragment. Apex anterior angulation between the major fracture fragments. Moderate comminution within the anterior fracture fragment extending obliquely into the left humeral metadiaphysis. This anterior fracture extends up towards the lesser tuberosity but a separate lesser tuberosity fragment is not well seen. There is also some mild comminution laterally along the surgical neck fracture site. 2. Greater tuberosity fracture displaced only 0.4 cm. 3. Although technically a Neer 2 part fracture, the comminution with intermediary fragments along the humeral metadiaphysis may indicate greater complexity than  is typical. 4. Indistinctness of tissue planes and  local hematoma tracking along musculature adjacent to the fracture, most notably along the biceps. 5. Old healed left rib fractures. 6.  Aortic Atherosclerosis (ICD10-I70.0).   COGNITION: Overall cognitive status: Within functional limits for tasks assessed     SENSATION: WFL  POSTURE: Rounded shoulders, R shoulder is slightly hiked   UPPER EXTREMITY ROM:   Active ROM Left eval Left 06/09/24 Left 06/09/24  Shoulder flexion 65 p! 86 100  Shoulder extension     Shoulder abduction 55 p! 70 69  Shoulder adduction     Shoulder internal rotation To gluteal fold    Shoulder external rotation C1    Elbow flexion     Elbow extension     Wrist flexion     Wrist extension     Wrist ulnar deviation     Wrist radial deviation     Wrist pronation     Wrist supination     (Blank rows = not tested)  UPPER EXTREMITY MMT:  MMT Right eval Left eval  Shoulder flexion  2-  Shoulder extension    Shoulder abduction  2-  Shoulder adduction    Shoulder internal rotation  2-  Shoulder external rotation  2-  Middle trapezius    Lower trapezius    Elbow flexion  5  Elbow extension  5  Wrist flexion    Wrist extension    Wrist ulnar deviation    Wrist radial deviation    Wrist pronation    Wrist supination    Grip strength (lbs)    (Blank rows = not tested)  SHOULDER SPECIAL TESTS: Rotator cuff assessment: Drop arm test: positive , Empty can test: positive , and Full can test: positive  Biceps assessment: Speed's test: negative  JOINT MOBILITY TESTING:  PROM all directions, muscle guards above 90d and has pain  PALPATION:  No TTP, was advised about cross friction massage to prevent scar tissue build up                                                                                                                              TREATMENT DATE:  06/17/24 UBE L1 x 2 min each  AAROM flex 1lb WaTE 2x10 Goals  AROM abd 2x10 Rows &  Ext red 2x10 UE ER/IR 2x10 AROM PROM  to LUE with end range holds   GH Jt mobs Supine flex 2lb LUE X10  06/09/24 UBE L 1 x 2 min each AAROM 1lb  WaTE  flexion, ext, and IR x10 Rows & lats 15lb 2x10 Rows and ext yellow 2x10 Wall slides x10 Shoulder abd and flex AROM 2x10  06/02/24 NuStep L4 x 6 mins AAROM dowel  flexion, ext, and IR 2x10 Rows and ext red 2x10 ER/IR with yellow band 2x10 Wall slides x10 PROM  to LUE with end range holds   GH Jt mobs  05/28/24 NuStep L5x52mins AAROM 1# WaTE flexion, ext, IR 2x10 Rows and ext red 2x10  ER/IR with yellow band 2x10 Wall slides x10 PROM with end range holds, joint mobs posterior and inferior   05/26/24 UBE L 1 x 2.5 min each  AAROM 1lb WaTE flex, Ext, IR x10 Row & Ext red 2x10 PROM  to LUE with end range holds   GH Jt mobs  05/15/24 Pulleys flexion and abd 2 mins each way  AAROM with dowel all directions x10  Isometrics with ball against wall 5s holds  PROM with end range holds  Sidelying ER x10, then 1# x10 Yellow band ext and rows 2x10  05/08/23- EVAL, HEP   PATIENT EDUCATION: Education details: POC, HEP Person educated: Patient Education method: Medical illustrator Education comprehension: verbalized understanding and returned demonstration  HOME EXERCISE PROGRAM: Access Code: [email or web address] URL: [email or web address] Date: 06/17/2024 Prepared by: Tanda Sorrow  Exercises - Standing Shoulder Flexion AAROM with Dowel  - 1 x daily - 7 x weekly - 2 sets - 10 reps - Standing Bilateral Shoulder Internal Rotation AAROM with Dowel  - 1 x daily - 7 x weekly - 2 sets - 10 reps - Standing Row with Resistance  - 1 x daily - 7 x weekly - 3 sets - 10 reps - Standing Shoulder Extension with Resistance  - 1 x daily - 7 x weekly - 3 sets - 10 reps - Standing Shoulder Abduction Full Range  - 1 x daily - 7 x weekly - 3 sets - 10 reps - Standing Shoulder Flexion Full Range  - 1 x daily - 7 x weekly - 3 sets -  10 reps  ASSESSMENT:  CLINICAL IMPRESSION: Patient is a 68 y.o. female who was seen today for physical therapy treatment for Lt reverse TSA. She is currently 14 weeks post op but not at the appropriate functional level. She has good PROM but is unable to achieve the ranges actively. Updated HEP for more strength.  Postural compensation with activity due to decrease AROM. Will continue to progress to increase functional ability       OBJECTIVE IMPAIRMENTS: decreased ROM, decreased strength, impaired UE functional use, and improper body mechanics.   ACTIVITY LIMITATIONS: carrying, lifting, reach over head, and hygiene/grooming  PARTICIPATION LIMITATIONS: cleaning, laundry, shopping, community activity, and yard work  [facility] POTENTIAL: Good  CLINICAL DECISION MAKING: Stable/uncomplicated  EVALUATION COMPLEXITY: Low  GOALS: Goals reviewed with patient? Yes  SHORT TERM GOALS: Target date: 06/18/24  Patient will be independent with initial HEP.  Baseline:  Goal status: Met 05/25/24  2.  Patient will increase L shoulder flexion and abduction ROM to 100d  Baseline: 65 flex, 55 abd Goal status: ongoing 05/26/24   LONG TERM GOALS: Target date: 07/30/24  Patient will be independent with advanced/ongoing HEP to improve outcomes and carryover.  Baseline:  Goal status: INITIAL  2.  Patient to improve L shoulder AROM to [facility] without pain provocation to allow for increased ease of ADLs.  Baseline:  Goal status: ongoing 06/02/24  3.  Patient will demonstrate improved functional UE strength as demonstrated by 4/5 in all muscle groups. Baseline:  Goal status: Ongoing 06/17/24  4.  Patient will be able to reach over head to do her hair without difficulty    Baseline:  Goal status: Met 06/17/24   PLAN:  PT FREQUENCY: 2x/week  PT DURATION: 12 weeks  PLANNED INTERVENTIONS: [id number]-Therapeutic exercises, 97530- Therapeutic activity, 97112- Neuromuscular re-education, 97535- Self Care, [id number]-  Manual therapy, [id number]- Vasopneumatic device, [id number] (1-2 muscles), [id number] (3+ muscles)- Dry Needling, Patient/Family education, Joint  mobilization, Spinal mobilization, Cryotherapy, and Moist heat  PLAN FOR NEXT SESSION:  AAROM, PROM, light strengthening as tolerated  Following Dr. Melita protocol  -Week 6-8: improve shoulder strength, gradually progress AAROM, AROM and PROM in all planes, begin pulley ROM, pendelums, begin isometrics   -Weeks 8-12: progress PROM and AROM, add AROM exercises sidelying, start resistance with bands   Tanda KANDICE Sorrow, PTA 06/17/2024, 11:45 AM

## 2024-06-18 ENCOUNTER — Ambulatory Visit: Admitting: Physical Therapy

## 2024-06-18 ENCOUNTER — Encounter: Payer: Self-pay | Admitting: Physical Therapy

## 2024-06-18 DIAGNOSIS — Z96612 Presence of left artificial shoulder joint: Secondary | ICD-10-CM | POA: Diagnosis not present

## 2024-06-18 DIAGNOSIS — M25612 Stiffness of left shoulder, not elsewhere classified: Secondary | ICD-10-CM

## 2024-06-18 DIAGNOSIS — M6281 Muscle weakness (generalized): Secondary | ICD-10-CM

## 2024-06-18 NOTE — Therapy (Signed)
 OUTPATIENT PHYSICAL THERAPY SHOULDER TREATMENT   Patient Name: Carrie [name] MRN: [id number] DOB:1956-06-15, 68 y.o., female Today's Date: 06/18/2024  END OF SESSION:  PT End of Session - 06/18/24 1400     Visit Number 8    Date for PT Re-Evaluation 07/30/24    PT Start Time 1400    PT Stop Time 1430    PT Time Calculation (min) 30 min    Activity Tolerance Patient tolerated treatment well    Behavior During Therapy WFL for tasks assessed/performed            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   Liver cirrhosis (HCC)    Nausea and vomiting 01/06/2013   Neuropathy    Bil feet re: to Diabetes   Obesity    Osteoporosis    Plantar fasciitis    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   Stricture and stenosis of esophagus    Stroke (HCC)    TIA (transient ischemic attack)    8-10 yrs ago, no problems since   Tuberculosis    tested positive 2011, no symptoms, was on medicine for 6 months   Past Surgical History:  Procedure Laterality Date   BREAST SURGERY Bilateral    biopsies both breast   CARDIAC CATHETERIZATION  08/30/2010   CESAREAN SECTION     x 2    CHOLECYSTECTOMY     COLONOSCOPY     FOOT SURGERY Left    spur removal   HERNIA REPAIR     umbilical hernia repaired at the same time as cholecystectomy   IR FLUORO GUIDE CV LINE RIGHT  04/12/2021   IR US  GUIDE VASC ACCESS RIGHT  04/12/2021   JOINT REPLACEMENT     bilat knee    KNEE SURGERY Bilateral    x 2 joint knee replacements   KYPHOPLASTY N/A 03/28/2022   Procedure: LUMBAR ONE  KYPHOPLASTY;  Surgeon: Beuford Anes, MD;  Location: [facility];  Service: Orthopedics;  Laterality: N/A;  LUMBAR ONE KYPHOPLASTY   REVERSE SHOULDER ARTHROPLASTY Left 03/06/2024   Procedure: ARTHROPLASTY, SHOULDER, TOTAL, REVERSE;  Surgeon: Dozier Soulier, MD;  Location: [facility];  Service: Orthopedics;  Laterality: Left;   SHOULDER ARTHROSCOPY Right 12/18/2022   Procedure: RIGHT SHOULDER ARTHROSCOPY, ACROMIOPLASTY AND DEBRIDEMENT;  Surgeon: Sheril Coy, MD;  Location: [facility];  Service: Orthopedics;  Laterality: Right;   TOTAL HIP ARTHROPLASTY Left 10/26/2022   Procedure: TOTAL HIP ARTHROPLASTY ANTERIOR APPROACH;  Surgeon: Yvone Rush, MD;  Location: [facility];  Service: Orthopedics;  Laterality: Left;   TOTAL KNEE REVISION Left 10/03/2021   Procedure: LEFT TOTAL KNEE REVISION POLY SWAP WITH IRRIGATION AND DEBRIDEMENT;  Surgeon: Sheril Coy, MD;  Location: [facility];  Service: Orthopedics;  Laterality: Left;   UPPER GASTROINTESTINAL ENDOSCOPY     WISDOM TOOTH EXTRACTION     Patient Active Problem List   Diagnosis Date Noted   S/P reverse total  shoulder arthroplasty, left 03/06/2024   Acute on chronic anemia 03/05/2024   OP (osteoporosis) 10/21/2023   Moderate persistent asthma 10/26/2022   Stage 3a chronic kidney disease (CKD) (HCC) 04/06/2022   Microcytic anemia 03/11/2022   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/16/2020   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/2020   Psoriatic arthritis (HCC) 07/08/2017   Mild neurocognitive disorder 10/18/2016   DM2 (diabetes mellitus, type 2) (HCC) 12/04/2013   Benign essential HTN 04/21/2013   Hyperlipidemia 01/09/2008   OBESITY 01/09/2008   GERD with esophageal stricture and stenosis s/p dilation  01/09/2008   HERNIA, VENTRAL 01/09/2008   Anxiety and depression 03/25/2007    PCP: Madelin Brought  REFERRING PROVIDER: Eva Herring  REFERRING DIAG: Lt reverse TSA  THERAPY DIAG:  Muscle weakness (generalized)  Stiffness of left  shoulder, not elsewhere classified  S/P reverse total shoulder arthroplasty, left  Rationale for Evaluation and Treatment: Rehabilitation  ONSET DATE: 03/06/24  SUBJECTIVE:                                                                                                                                                                                      SUBJECTIVE STATEMENT: Fine, its not hurting  PERTINENT HISTORY: Bilateral knee replacements, L total knee revision 2022 2023 kyphoplasty L1  L THA 2024  PAIN:  Are you having pain? 0/10 L shoulder, If she moves it the wrong way  PRECAUTIONS: Shoulder lifting over 5#  RED FLAGS: None   WEIGHT BEARING RESTRICTIONS: No  FALLS:  Has patient fallen in last 6 months? Yes. Number of falls 1  LIVING ENVIRONMENT: Lives with: lives alone Lives in: House/apartment Stairs: Yes: External: 2 steps; can reach both  OCCUPATION: Disability   PLOF: Independent  PATIENT GOALS: get back to normal  NEXT MD VISIT:   OBJECTIVE:  Note: Objective measures were completed at Evaluation unless otherwise noted.  DIAGNOSTIC FINDINGS:  Acute displaced proximal left humerus surgical neck fracture.  IMPRESSION: 1. Surgical neck fracture left proximal humerus with shaft 2.1 cm lateral/proximal to the humeral head fragment. Apex anterior angulation between the major fracture fragments. Moderate comminution within the anterior fracture fragment extending obliquely into the left humeral metadiaphysis. This anterior fracture extends up towards the lesser tuberosity but a separate lesser tuberosity fragment is not well seen. There is also some mild comminution laterally along the surgical neck fracture site. 2. Greater tuberosity fracture displaced only 0.4 cm. 3. Although technically a Neer 2 part fracture, the comminution with intermediary fragments along the humeral metadiaphysis may indicate greater complexity than is typical. 4. Indistinctness  of tissue planes and local hematoma tracking along musculature adjacent  to the fracture, most notably along the biceps. 5. Old healed left rib fractures. 6.  Aortic Atherosclerosis (ICD10-I70.0).   COGNITION: Overall cognitive status: Within functional limits for tasks assessed     SENSATION: WFL  POSTURE: Rounded shoulders, R shoulder is slightly hiked   UPPER EXTREMITY ROM:   Active ROM Left eval Left 06/09/24 Left 06/17/24  Shoulder flexion 65 p! 86 100  Shoulder extension     Shoulder abduction 55 p! 70 69  Shoulder adduction     Shoulder internal rotation To gluteal fold    Shoulder external rotation C1    Elbow flexion     Elbow extension     Wrist flexion     Wrist extension     Wrist ulnar deviation     Wrist radial deviation     Wrist pronation     Wrist supination     (Blank rows = not tested)  UPPER EXTREMITY MMT:  MMT Right eval Left eval  Shoulder flexion  2-  Shoulder extension    Shoulder abduction  2-  Shoulder adduction    Shoulder internal rotation  2-  Shoulder external rotation  2-  Middle trapezius    Lower trapezius    Elbow flexion  5  Elbow extension  5  Wrist flexion    Wrist extension    Wrist ulnar deviation    Wrist radial deviation    Wrist pronation    Wrist supination    Grip strength (lbs)    (Blank rows = not tested)  SHOULDER SPECIAL TESTS: Rotator cuff assessment: Drop arm test: positive , Empty can test: positive , and Full can test: positive  Biceps assessment: Speed's test: negative  JOINT MOBILITY TESTING:  PROM all directions, muscle guards above 90d and has pain  PALPATION:  No TTP, was advised about cross friction massage to prevent scar tissue build up                                                                                                                              TREATMENT DATE:  06/18/24 UBE L1 x 2 min each  Rows and lats 15lb 2x10 Shoulder Flex & Abd 1lb 2x10 each UE ER/IR 2x10 AROM LUE IR  yellow 2x10 Shoulder Ext red 2x10   06/17/24 UBE L1 x 2 min each  AAROM flex 1lb WaTE 2x10 Goals  AROM abd 2x10 Rows & Ext red 2x10 UE ER/IR 2x10 AROM PROM  to LUE with end range holds   GH Jt mobs Supine flex 2lb LUE X10  06/09/24 UBE L 1 x 2 min each AAROM 1lb  WaTE  flexion, ext, and IR x10 Rows & lats 15lb 2x10 Rows and ext yellow 2x10 Wall slides x10 Shoulder abd and flex AROM 2x10  06/02/24 NuStep L4 x 6 mins AAROM dowel  flexion, ext, and IR 2x10 Rows and ext red 2x10 ER/IR with yellow band 2x10 Wall slides x10 PROM  to LUE with end range holds   GH Jt mobs  05/28/24 NuStep L5x50mins AAROM 1# WaTE flexion, ext, IR 2x10 Rows and ext red 2x10 ER/IR with yellow band 2x10 Wall slides x10 PROM with end range holds, joint mobs posterior and inferior   05/26/24 UBE L 1 x 2.5 min each  AAROM 1lb WaTE flex, Ext, IR x10 Row & Ext red 2x10 PROM  to LUE with end range holds   GH Jt mobs  05/15/24 Pulleys flexion and abd 2 mins each way  AAROM with dowel all directions x10  Isometrics with ball against wall 5s holds  PROM with end range holds  Sidelying ER x10, then 1# x10 Yellow band ext and rows 2x10  05/08/23- EVAL, HEP   PATIENT EDUCATION: Education details: POC, HEP Person educated: Patient Education method: Medical illustrator Education comprehension: verbalized understanding and returned demonstration  HOME EXERCISE PROGRAM: Access Code: [email or web address] URL: [email or web address] Date: 06/17/2024 Prepared by: Tanda Sorrow  Exercises - Standing Shoulder Flexion AAROM with Dowel  - 1 x daily - 7 x weekly - 2 sets - 10 reps - Standing Bilateral Shoulder Internal Rotation AAROM with Dowel  - 1 x daily - 7 x weekly - 2 sets - 10 reps - Standing Row with Resistance  - 1 x daily - 7 x weekly - 3 sets - 10 reps - Standing Shoulder Extension with Resistance  - 1 x daily - 7 x weekly - 3 sets - 10 reps - Standing Shoulder Abduction Full  Range  - 1 x daily - 7 x weekly - 3 sets - 10 reps - Standing Shoulder Flexion Full Range  - 1 x daily - 7 x weekly - 3 sets - 10 reps  ASSESSMENT:  CLINICAL IMPRESSION: Patient is a 68 y.o. female who was seen today for physical therapy treatment for Lt reverse TSA. She is currently 14 weeks post op but not at the appropriate functional level. She enters 15 min late. Postural compensation with activity due to decrease AROM.  Weakness with lat pull downs, but able to complete. Tactile cues needed for  posture with shoulder Ext.  Will continue to progress to increase functional ability       OBJECTIVE IMPAIRMENTS: decreased ROM, decreased strength, impaired UE functional use, and improper body mechanics.   ACTIVITY LIMITATIONS: carrying, lifting, reach over head, and hygiene/grooming  PARTICIPATION LIMITATIONS: cleaning, laundry, shopping, community activity, and yard work  [facility] POTENTIAL: Good  CLINICAL DECISION MAKING: Stable/uncomplicated  EVALUATION COMPLEXITY: Low  GOALS: Goals reviewed with patient? Yes  SHORT TERM GOALS: Target date: 06/18/24  Patient will be independent with initial HEP.  Baseline:  Goal status: Met 05/25/24  2.  Patient will increase L shoulder flexion and abduction ROM to 100d  Baseline: 65 flex, 55 abd Goal status: ongoing 05/26/24   LONG TERM GOALS: Target date: 07/30/24  Patient will be independent with advanced/ongoing HEP to improve outcomes and carryover.  Baseline:  Goal status: INITIAL  2.  Patient to improve L shoulder AROM to [facility] without pain provocation to allow for increased ease of ADLs.  Baseline:  Goal status: ongoing 06/02/24  3.  Patient will demonstrate improved functional UE strength as demonstrated by 4/5 in all muscle groups. Baseline:  Goal status: Ongoing 06/17/24  4.  Patient will be able to reach over head to do her hair without difficulty    Baseline:  Goal status: Met 06/17/24   PLAN:  PT FREQUENCY: 2x/week  PT  DURATION: 12 weeks  PLANNED INTERVENTIONS: [id number]-Therapeutic exercises, 97530- Therapeutic activity, [id number]- Neuromuscular re-education, 97535- Self Care, [id number]- Manual therapy, [id number]- Vasopneumatic device, [phone] (1-2 muscles), [phone] (3+ muscles)- Dry Needling, Patient/Family education, Joint mobilization, Spinal mobilization, Cryotherapy, and Moist heat  PLAN FOR NEXT SESSION:  AAROM, PROM, light strengthening as tolerated  Following Dr. Melita protocol  -Week 6-8: improve shoulder strength, gradually progress AAROM, AROM and PROM in all planes, begin pulley ROM, pendelums, begin isometrics   -Weeks 8-12: progress PROM and AROM, add AROM exercises sidelying, start resistance with bands   Tanda KANDICE Sorrow, PTA 06/18/2024, 2:00 PM

## 2024-06-23 ENCOUNTER — Ambulatory Visit

## 2024-06-26 ENCOUNTER — Ambulatory Visit: Admitting: Physical Therapy

## 2024-06-29 ENCOUNTER — Ambulatory Visit: Admitting: Physical Therapy

## 2024-06-30 ENCOUNTER — Emergency Department (HOSPITAL_COMMUNITY)

## 2024-06-30 ENCOUNTER — Other Ambulatory Visit: Payer: Self-pay

## 2024-06-30 ENCOUNTER — Observation Stay (HOSPITAL_COMMUNITY)
Admission: EM | Admit: 2024-06-30 | Discharge: 2024-07-06 | Disposition: A | Discharge status: Skilled Nursing Facility | Attending: Emergency Medicine | Admitting: Emergency Medicine

## 2024-06-30 DIAGNOSIS — Z79899 Other long term (current) drug therapy: Secondary | ICD-10-CM | POA: Diagnosis not present

## 2024-06-30 DIAGNOSIS — K5900 Constipation, unspecified: Secondary | ICD-10-CM | POA: Diagnosis not present

## 2024-06-30 DIAGNOSIS — K7460 Unspecified cirrhosis of liver: Secondary | ICD-10-CM | POA: Diagnosis present

## 2024-06-30 DIAGNOSIS — K219 Gastro-esophageal reflux disease without esophagitis: Secondary | ICD-10-CM | POA: Diagnosis not present

## 2024-06-30 DIAGNOSIS — D649 Anemia, unspecified: Secondary | ICD-10-CM | POA: Diagnosis present

## 2024-06-30 DIAGNOSIS — Z7984 Long term (current) use of oral hypoglycemic drugs: Secondary | ICD-10-CM | POA: Diagnosis not present

## 2024-06-30 DIAGNOSIS — F39 Unspecified mood [affective] disorder: Secondary | ICD-10-CM | POA: Insufficient documentation

## 2024-06-30 DIAGNOSIS — Z8673 Personal history of transient ischemic attack (TIA), and cerebral infarction without residual deficits: Secondary | ICD-10-CM | POA: Insufficient documentation

## 2024-06-30 DIAGNOSIS — Z96612 Presence of left artificial shoulder joint: Secondary | ICD-10-CM | POA: Insufficient documentation

## 2024-06-30 DIAGNOSIS — M810 Age-related osteoporosis without current pathological fracture: Secondary | ICD-10-CM | POA: Diagnosis present

## 2024-06-30 DIAGNOSIS — E785 Hyperlipidemia, unspecified: Secondary | ICD-10-CM | POA: Diagnosis not present

## 2024-06-30 DIAGNOSIS — D631 Anemia in chronic kidney disease: Secondary | ICD-10-CM | POA: Insufficient documentation

## 2024-06-30 DIAGNOSIS — Z96642 Presence of left artificial hip joint: Secondary | ICD-10-CM | POA: Insufficient documentation

## 2024-06-30 DIAGNOSIS — J4540 Moderate persistent asthma, uncomplicated: Secondary | ICD-10-CM | POA: Diagnosis not present

## 2024-06-30 DIAGNOSIS — F419 Anxiety disorder, unspecified: Secondary | ICD-10-CM | POA: Diagnosis present

## 2024-06-30 DIAGNOSIS — E1122 Type 2 diabetes mellitus with diabetic chronic kidney disease: Secondary | ICD-10-CM | POA: Diagnosis not present

## 2024-06-30 DIAGNOSIS — L4052 Psoriatic arthritis mutilans: Secondary | ICD-10-CM | POA: Insufficient documentation

## 2024-06-30 DIAGNOSIS — S72112A Displaced fracture of greater trochanter of left femur, initial encounter for closed fracture: Secondary | ICD-10-CM | POA: Diagnosis present

## 2024-06-30 DIAGNOSIS — S72102A Unspecified trochanteric fracture of left femur, initial encounter for closed fracture: Secondary | ICD-10-CM

## 2024-06-30 DIAGNOSIS — W19XXXA Unspecified fall, initial encounter: Principal | ICD-10-CM

## 2024-06-30 DIAGNOSIS — I129 Hypertensive chronic kidney disease with stage 1 through stage 4 chronic kidney disease, or unspecified chronic kidney disease: Secondary | ICD-10-CM | POA: Insufficient documentation

## 2024-06-30 DIAGNOSIS — Z7982 Long term (current) use of aspirin: Secondary | ICD-10-CM | POA: Insufficient documentation

## 2024-06-30 DIAGNOSIS — E559 Vitamin D deficiency, unspecified: Secondary | ICD-10-CM | POA: Insufficient documentation

## 2024-06-30 DIAGNOSIS — G2581 Restless legs syndrome: Secondary | ICD-10-CM | POA: Diagnosis not present

## 2024-06-30 DIAGNOSIS — G3184 Mild cognitive impairment, so stated: Secondary | ICD-10-CM | POA: Diagnosis present

## 2024-06-30 DIAGNOSIS — I10 Essential (primary) hypertension: Secondary | ICD-10-CM | POA: Diagnosis present

## 2024-06-30 DIAGNOSIS — W01198A Fall on same level from slipping, tripping and stumbling with subsequent striking against other object, initial encounter: Secondary | ICD-10-CM | POA: Diagnosis not present

## 2024-06-30 DIAGNOSIS — N1831 Chronic kidney disease, stage 3a: Secondary | ICD-10-CM | POA: Diagnosis not present

## 2024-06-30 DIAGNOSIS — L4050 Arthropathic psoriasis, unspecified: Secondary | ICD-10-CM | POA: Diagnosis present

## 2024-06-30 DIAGNOSIS — S72109A Unspecified trochanteric fracture of unspecified femur, initial encounter for closed fracture: Secondary | ICD-10-CM | POA: Diagnosis present

## 2024-06-30 DIAGNOSIS — F32A Depression, unspecified: Secondary | ICD-10-CM | POA: Diagnosis present

## 2024-06-30 DIAGNOSIS — E118 Type 2 diabetes mellitus with unspecified complications: Secondary | ICD-10-CM | POA: Insufficient documentation

## 2024-06-30 LAB — I-STAT CHEM 8, ED
BUN: 20 mg/dL (ref 8–23)
Calcium, Ion: 1.15 mmol/L (ref 1.15–1.40)
Chloride: 105 mmol/L (ref 98–111)
Creatinine, Ser: 1 mg/dL (ref 0.44–1.00)
Glucose, Bld: 117 mg/dL — ABNORMAL HIGH (ref 70–99)
HCT: 35 % — ABNORMAL LOW (ref 36.0–46.0)
Hemoglobin: 11.9 g/dL — ABNORMAL LOW (ref 12.0–15.0)
Potassium: 4.4 mmol/L (ref 3.5–5.1)
Sodium: 139 mmol/L (ref 135–145)
TCO2: 25 mmol/L (ref 22–32)

## 2024-06-30 LAB — CBC WITH DIFFERENTIAL/PLATELET
Abs Immature Granulocytes: 0.05 K/uL (ref 0.00–0.07)
Basophils Absolute: 0.1 K/uL (ref 0.0–0.1)
Basophils Relative: 1 %
Eosinophils Absolute: 0.2 K/uL (ref 0.0–0.5)
Eosinophils Relative: 2 %
HCT: 36.2 % (ref 36.0–46.0)
Hemoglobin: 10.9 g/dL — ABNORMAL LOW (ref 12.0–15.0)
Immature Granulocytes: 1 %
Lymphocytes Relative: 24 %
Lymphs Abs: 2.1 K/uL (ref 0.7–4.0)
MCH: 23.3 pg — ABNORMAL LOW (ref 26.0–34.0)
MCHC: 30.1 g/dL (ref 30.0–36.0)
MCV: 77.4 fL — ABNORMAL LOW (ref 80.0–100.0)
Monocytes Absolute: 0.7 K/uL (ref 0.1–1.0)
Monocytes Relative: 8 %
Neutro Abs: 5.5 K/uL (ref 1.7–7.7)
Neutrophils Relative %: 64 %
Platelets: 333 K/uL (ref 150–400)
RBC: 4.68 MIL/uL (ref 3.87–5.11)
RDW: 19.6 % — ABNORMAL HIGH (ref 11.5–15.5)
WBC: 8.6 K/uL (ref 4.0–10.5)
nRBC: 0 % (ref 0.0–0.2)

## 2024-06-30 MED ORDER — BUSPIRONE HCL 5 MG PO TABS
10.0000 mg | ORAL_TABLET | Freq: Every day | ORAL | Status: DC
  Administered 2024-06-30 – 2024-07-05 (×6): 10 mg via ORAL
  Filled 2024-06-30 (×6): qty 2

## 2024-06-30 MED ORDER — ENOXAPARIN SODIUM 40 MG/0.4ML IJ SOSY
40.0000 mg | PREFILLED_SYRINGE | INTRAMUSCULAR | Status: DC
  Administered 2024-07-01 – 2024-07-06 (×6): 40 mg via SUBCUTANEOUS
  Filled 2024-06-30 (×6): qty 0.4

## 2024-06-30 MED ORDER — FENTANYL CITRATE PF 50 MCG/ML IJ SOSY
50.0000 ug | PREFILLED_SYRINGE | Freq: Once | INTRAMUSCULAR | Status: AC
  Administered 2024-06-30: 50 ug via INTRAVENOUS
  Filled 2024-06-30: qty 1

## 2024-06-30 MED ORDER — OXYCODONE HCL 5 MG PO TABS
5.0000 mg | ORAL_TABLET | ORAL | Status: DC | PRN
  Administered 2024-06-30 – 2024-07-02 (×5): 5 mg via ORAL
  Filled 2024-06-30 (×5): qty 1

## 2024-06-30 MED ORDER — MELATONIN 3 MG PO TABS: 6.0000 mg | ORAL_TABLET | Freq: Every evening | ORAL | Status: DC | PRN

## 2024-06-30 MED ORDER — DILTIAZEM HCL ER COATED BEADS 120 MG PO CP24
240.0000 mg | ORAL_CAPSULE | Freq: Every day | ORAL | Status: DC
Start: 2024-07-01 — End: 2024-07-04
  Administered 2024-07-01 – 2024-07-03 (×3): 240 mg via ORAL
  Filled 2024-06-30 (×4): qty 2

## 2024-06-30 MED ORDER — LOSARTAN POTASSIUM 50 MG PO TABS
100.0000 mg | ORAL_TABLET | Freq: Every day | ORAL | Status: DC
  Administered 2024-07-01 – 2024-07-06 (×6): 100 mg via ORAL
  Filled 2024-06-30 (×6): qty 2

## 2024-06-30 MED ORDER — PANTOPRAZOLE SODIUM 40 MG PO TBEC
40.0000 mg | DELAYED_RELEASE_TABLET | Freq: Every day | ORAL | Status: DC
Start: 2024-07-01 — End: 2024-07-06
  Administered 2024-07-01 – 2024-07-06 (×6): 40 mg via ORAL
  Filled 2024-06-30 (×6): qty 1

## 2024-06-30 MED ORDER — PRIMIDONE 250 MG PO TABS
125.0000 mg | ORAL_TABLET | Freq: Two times a day (BID) | ORAL | Status: DC
  Administered 2024-07-01 – 2024-07-06 (×11): 125 mg via ORAL
  Filled 2024-06-30 (×15): qty 1

## 2024-06-30 MED ORDER — ASPIRIN 81 MG PO TBEC
81.0000 mg | DELAYED_RELEASE_TABLET | Freq: Every day | ORAL | Status: DC
  Administered 2024-07-01 – 2024-07-06 (×6): 81 mg via ORAL
  Filled 2024-06-30 (×6): qty 1

## 2024-06-30 MED ORDER — ONDANSETRON HCL 4 MG/2ML IJ SOLN
4.0000 mg | Freq: Once | INTRAMUSCULAR | Status: AC
  Administered 2024-06-30: 4 mg via INTRAVENOUS
  Filled 2024-06-30: qty 2

## 2024-06-30 MED ORDER — MORPHINE SULFATE (PF) 4 MG/ML IV SOLN
4.0000 mg | Freq: Once | INTRAVENOUS | Status: AC
  Administered 2024-06-30: 4 mg via INTRAVENOUS
  Filled 2024-06-30: qty 1

## 2024-06-30 MED ORDER — OXYCODONE HCL 5 MG PO TABS: 2.5000 mg | ORAL_TABLET | ORAL | Status: DC | PRN

## 2024-06-30 MED ORDER — LACTATED RINGERS IV BOLUS
500.0000 mL | Freq: Once | INTRAVENOUS | Status: AC
  Administered 2024-06-30: 500 mL via INTRAVENOUS

## 2024-06-30 MED ORDER — POLYETHYLENE GLYCOL 3350 17 G PO PACK: 17.0000 g | PACK | Freq: Every day | ORAL | Status: DC | PRN

## 2024-06-30 MED ORDER — BUDESONIDE 0.25 MG/2ML IN SUSP
0.2500 mg | Freq: Two times a day (BID) | RESPIRATORY_TRACT | Status: DC
  Administered 2024-07-01 (×2): 0.25 mg via RESPIRATORY_TRACT
  Filled 2024-06-30 (×2): qty 2

## 2024-06-30 MED ORDER — ACETAMINOPHEN 500 MG PO TABS
1000.0000 mg | ORAL_TABLET | Freq: Four times a day (QID) | ORAL | Status: DC | PRN
  Administered 2024-07-01: 1000 mg via ORAL
  Filled 2024-06-30: qty 2

## 2024-06-30 MED ORDER — METHOCARBAMOL 500 MG PO TABS
500.0000 mg | ORAL_TABLET | Freq: Three times a day (TID) | ORAL | Status: DC | PRN
  Administered 2024-06-30 – 2024-07-03 (×4): 500 mg via ORAL
  Filled 2024-06-30 (×4): qty 1

## 2024-06-30 MED ORDER — DULOXETINE HCL 60 MG PO CPEP
60.0000 mg | ORAL_CAPSULE | Freq: Every day | ORAL | Status: DC
  Administered 2024-07-01 – 2024-07-06 (×6): 60 mg via ORAL
  Filled 2024-06-30 (×6): qty 1

## 2024-06-30 MED ORDER — ONDANSETRON HCL 4 MG/2ML IJ SOLN: 4.0000 mg | Freq: Four times a day (QID) | INTRAMUSCULAR | Status: DC | PRN

## 2024-06-30 MED ORDER — ALBUTEROL SULFATE (2.5 MG/3ML) 0.083% IN NEBU: 2.5000 mg | INHALATION_SOLUTION | RESPIRATORY_TRACT | Status: DC | PRN

## 2024-06-30 MED ORDER — AMLODIPINE BESYLATE 2.5 MG PO TABS
2.5000 mg | ORAL_TABLET | Freq: Every day | ORAL | Status: DC
  Administered 2024-07-01 – 2024-07-03 (×3): 2.5 mg via ORAL
  Filled 2024-06-30 (×4): qty 1

## 2024-06-30 MED ORDER — HYDROMORPHONE HCL 1 MG/ML IJ SOLN
0.5000 mg | INTRAMUSCULAR | Status: DC | PRN
  Administered 2024-07-01: 0.5 mg via INTRAVENOUS
  Filled 2024-06-30: qty 0.5

## 2024-06-30 MED ORDER — PRAVASTATIN SODIUM 40 MG PO TABS
80.0000 mg | ORAL_TABLET | Freq: Every day | ORAL | Status: DC
  Administered 2024-07-01 – 2024-07-06 (×6): 80 mg via ORAL
  Filled 2024-06-30 (×6): qty 2

## 2024-06-30 MED ORDER — BUDESONIDE 0.25 MG/2ML IN SUSP
0.2500 mg | Freq: Two times a day (BID) | RESPIRATORY_TRACT | Status: DC
  Filled 2024-06-30: qty 2

## 2024-06-30 MED ORDER — BECLOMETHASONE DIPROP HFA 80 MCG/ACT IN AERB: 1.0000 | INHALATION_SPRAY | Freq: Two times a day (BID) | RESPIRATORY_TRACT | Status: DC

## 2024-06-30 NOTE — Discharge Instructions (Signed)
 May weight-bear as tolerated but with no active abduction.  Use a walker for ambulation

## 2024-06-30 NOTE — H&P (Signed)
 History and Physical    Carrie [name] FMW:[id number] DOB: Nov 21, 1955 DOA: 06/30/2024  PCP: Jolee Madelin Patch, MD   Patient coming from: Home   Chief Complaint:  Chief Complaint  Patient presents with   Fall    HPI:  Carrie [name] is a 68 y.o. female with hx of CVA, hypertension, hyperlipidemia, diabetes type 2, CKD 3A, moderate persistent asthma, anemia of chronic disease, psoriatic arthritis, osteoporosis, GERD, mood disorder, who presented after ground-level fall.   She was walking down steps to get the package of her Skyrizi medication.  There was a monster truck toy on the last step which she tried to brush away with her left foot and then lost her balance and fell onto her left side.  She hit her left side of her head and her left hip.  Has pain actually on the right side of her head and left hip.  Has difficulty with mobilization due to the severity of her pain. She denies any other recent illness.  No recent lightheadedness, no syncopal episode leading to fall.    Review of Systems:  ROS complete and negative except as marked above   Allergies  Allergen Reactions   Lisinopril Cough    Prior to Admission medications   Medication Sig Start Date End Date Taking? Authorizing Provider  Accu-Chek Softclix Lancets lancets Use as instructed to check blood sugar 1-2 times daily Dx E11.9 10/23/23   Trixie File, MD  acetaminophen  (TYLENOL ) 500 MG tablet Take 1,000 mg by mouth every 6 (six) hours as needed for moderate pain (pain score 4-6).    [provider]  albuterol  (VENTOLIN  HFA) 108 (90 Base) MCG/ACT inhaler Inhale 2 puffs into the lungs every 4 (four) hours as needed for wheezing or shortness of breath (or coughing).    [provider]  amLODipine  (NORVASC ) 2.5 MG tablet Take 2.5 mg by mouth daily. 09/03/22   [provider]  aspirin  EC 81 MG tablet Take 1 tablet (81 mg total) by mouth 2 (two) times daily. Swallow whole. 03/06/24   [place],  Hospital doctor, PA-C  Blood Glucose Monitoring Suppl (ACCU-CHEK GUIDE ME) w/Device KIT USE AS INSTRUCTED TO CHECK BLOOD SUGAR 2 TIMES DAILY DX E11.9 12/25/23   Trixie File, MD  busPIRone  (BUSPAR ) 5 MG tablet Take 10 mg by mouth at bedtime.    [provider]  Calcium  Carb-Cholecalciferol (CALCIUM  CARBONATE-VITAMIN D3 PO) Take 1 tablet by mouth daily.    [provider]  clotrimazole -betamethasone (LOTRISONE) cream Apply 1 Application topically 2 (two) times daily as needed (rash). 02/24/24   [provider]  Continuous Glucose Sensor (FREESTYLE LIBRE 3 PLUS SENSOR) MISC 1 each by Does not apply route every 14 (fourteen) days. 06/08/24   Trixie File, MD  denosumab  (PROLIA ) 60 MG/ML SOSY injection Inject 60 mg into the skin every 6 (six) months.    [provider]  diltiazem  (CARDIZEM  CD) 240 MG 24 hr capsule Take 240 mg by mouth daily.    [provider]  DULoxetine  (CYMBALTA ) 60 MG capsule Take 60 mg by mouth daily. 09/28/22   [provider]  ferrous sulfate  325 (65 FE) MG EC tablet Take 325 mg by mouth daily.    [provider]  glucose blood (ACCU-CHEK GUIDE TEST) test strip USE 1 STRIP 2 (TWO) TIMES DAILY. 03/25/24   Trixie File, MD  losartan  (COZAAR ) 100 MG tablet TAKE 1 TABLET BY MOUTH EVERY DAY IN THE MORNING 02/03/23   Trixie File, MD  Melatonin 12 MG TABS Take 12 mg by mouth at bedtime as needed (sleep).    [provider]  metFORMIN  (GLUCOPHAGE -XR) 500 MG 24 hr tablet Take 1 tablet (500 mg total) by mouth at bedtime. Patient taking differently: Take 500 mg by mouth 2 (two) times daily. 08/15/23   Motwani, Komal, MD  mirabegron  ER (MYRBETRIQ ) 25 MG TB24 tablet Take 25 mg by mouth daily.    [provider]  omeprazole (PRILOSEC) 40 MG capsule Take 40 mg by mouth 2 (two) times daily. 02/05/24 08/03/24  [provider]  pravastatin  (PRAVACHOL ) 80 MG tablet Take 80 mg by mouth daily.    [provider]  primidone  (MYSOLINE ) 250 MG tablet Take 125 mg by mouth 2 (two) times daily. 12/03/22   [provider]  QVAR REDIHALER 80 MCG/ACT inhaler Inhale 1 puff into the lungs 2 (two) times daily.    [provider]  Semaglutide , 1 MG/DOSE, (OZEMPIC , 1 MG/DOSE,) 4 MG/3ML SOPN Inject 1 mg into the skin once a week. 02/20/24   Trixie File, MD  STELARA 45 MG/0.5ML injection Inject 45 mg into the skin every 3 (three) months. 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   Liver cirrhosis (HCC)    Nausea and vomiting 01/06/2013   Neuropathy    Bil feet re: to Diabetes   Obesity    Osteoporosis    Plantar fasciitis    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   Stricture and stenosis of esophagus    Stroke (HCC)    TIA (transient ischemic attack)    8-10 yrs ago, no problems since   Tuberculosis    tested positive 2011, no symptoms, was on medicine for 6 months    Past Surgical History:  Procedure Laterality Date   BREAST SURGERY Bilateral    biopsies both breast   CARDIAC CATHETERIZATION  08/30/2010   CESAREAN SECTION     x 2    CHOLECYSTECTOMY     COLONOSCOPY     FOOT SURGERY Left    spur removal   HERNIA REPAIR     umbilical hernia repaired at the same time as cholecystectomy   IR FLUORO GUIDE CV LINE RIGHT  04/12/2021   IR US  GUIDE VASC ACCESS RIGHT  04/12/2021   JOINT REPLACEMENT     bilat knee    KNEE SURGERY Bilateral    x 2 joint knee replacements   KYPHOPLASTY N/A 03/28/2022   Procedure:  LUMBAR ONE KYPHOPLASTY;  Surgeon: Beuford Anes, MD;  Location: [facility];  Service: Orthopedics;  Laterality: N/A;  LUMBAR ONE KYPHOPLASTY   REVERSE SHOULDER ARTHROPLASTY Left 03/06/2024   Procedure: ARTHROPLASTY, SHOULDER, TOTAL, REVERSE;  Surgeon: Dozier Soulier, MD;  Location: [facility];  Service: Orthopedics;  Laterality: Left;   SHOULDER ARTHROSCOPY Right 12/18/2022   Procedure: RIGHT SHOULDER ARTHROSCOPY, ACROMIOPLASTY AND DEBRIDEMENT;  Surgeon:  Dalldorf, Peter, MD;  Location: [facility];  Service: Orthopedics;  Laterality: Right;   TOTAL HIP ARTHROPLASTY Left 10/26/2022   Procedure: TOTAL HIP ARTHROPLASTY ANTERIOR APPROACH;  Surgeon: Yvone Rush, MD;  Location: [facility];  Service: Orthopedics;  Laterality: Left;   TOTAL KNEE REVISION Left 10/03/2021   Procedure: LEFT TOTAL KNEE REVISION POLY SWAP WITH IRRIGATION AND DEBRIDEMENT;  Surgeon: Sheril Coy, MD;  Location: [facility];  Service: Orthopedics;  Laterality: Left;   UPPER GASTROINTESTINAL ENDOSCOPY     WISDOM TOOTH EXTRACTION       reports that she has never smoked. She has never been exposed to tobacco smoke. She has never used smokeless tobacco. She reports that she does not currently use alcohol. She reports that she does not use drugs.  Family History  Problem Relation Age of Onset   Diabetes Other        maternal aunts and uncles   Diabetes Father    Liver disease Father    Emphysema Father        smoked   Diabetes Sister    Diabetes Brother    Sarcoidosis Brother    Emphysema Mother        smoked   Asthma Mother    Asthma Brother    Colon cancer Neg Hx    Esophageal cancer Neg Hx    Stomach cancer Neg Hx    Rectal cancer Neg Hx      Physical Exam: Vitals:   06/30/24 1730 06/30/24 1859 06/30/24 1900 06/30/24 1908  BP: (!) 150/76  (!) 135/92 (!) 135/92  Pulse: 92  98 100  Resp:    16  Temp:  98.1 F (36.7 C)    TempSrc:  Oral    SpO2: 95%  99% 94%    Gen: Awake, alert, NAD  HEENT: There is a hematoma along the  left parietal scalp with an abrasion over the area CV: Regular, normal S1, S2, no murmurs  Resp: Normal WOB, CTAB  Abd: Flat, normoactive, nontender MSK: Symmetric, no edema.  Mild bruising along the left lateral hip, painful range of motion Skin: See HEENT.  No rashes or lesions to exposed skin  Neuro: Alert and interactive  Psych: euthymic, appropriate    Data review:   Labs reviewed, notable for:   Chemistries unremarkable Hemoglobin 10.9, microcytic   Micro:  Results for orders placed or performed during the hospital encounter of 03/05/24  Surgical pcr screen     Status: Abnormal   Collection Time: 03/05/24  3:04 PM   Specimen: Nasal Mucosa; Nasal Swab  Result Value Ref Range Status   MRSA, PCR NEGATIVE NEGATIVE Final   Staphylococcus aureus POSITIVE (A) NEGATIVE Final    Comment: (NOTE) The Xpert SA Assay (FDA approved for NASAL specimens in patients 84 years of age and older), is one component of a comprehensive surveillance program. It is not intended to diagnose infection nor to guide or monitor treatment. Performed at [facility], [address], [place], [place] [address]     Imaging reviewed:  CT Hip Left Wo Contrast Result Date: 06/30/2024 CLINICAL DATA:  Hip trauma, fracture suspected, xray done EXAM: CT OF THE LEFT HIP WITHOUT CONTRAST TECHNIQUE: Multidetector CT imaging of the left hip was performed according to the standard protocol. Multiplanar CT image reconstructions were also generated. RADIATION DOSE REDUCTION: This exam was performed according to the departmental dose-optimization program which includes automated exposure control, adjustment of the mA and/or kV according to patient size  and/or use of iterative reconstruction technique. COMPARISON:  Radiograph earlier today FINDINGS: Bones/Joint/Cartilage Left hip arthroplasty in place. No arthroplasty dislocation. There is a comminuted mildly displaced fracture through the greater  trochanter that extends to the lateral aspect of the femoral component. Pubic rami are intact. There is degenerative change of the pubic symphysis. No sacroiliac diastasis. Ligaments Suboptimally assessed by CT. Muscles and Tendons No evidence of muscular hematoma. Soft tissues Mild soft tissue edema laterally without confluent hematoma. No acute intrapelvic abnormalities. Pelvic free fluid. IMPRESSION: Comminuted mildly displaced fracture through the greater trochanter that extends to the lateral aspect of the femoral component of left hip arthroplasty. Electronically Signed   By: Andrea Gasman M.D.   On: 06/30/2024 18:21   CT Head Wo Contrast Result Date: 06/30/2024 CLINICAL DATA:  Tripped and fell. Blunt trauma to head and neck. Pain. EXAM: CT HEAD WITHOUT CONTRAST CT CERVICAL SPINE WITHOUT CONTRAST TECHNIQUE: Multidetector CT imaging of the head and cervical spine was performed following the standard protocol without intravenous contrast. Multiplanar CT image reconstructions of the cervical spine were also generated. RADIATION DOSE REDUCTION: This exam was performed according to the departmental dose-optimization program which includes automated exposure control, adjustment of the mA and/or kV according to patient size and/or use of iterative reconstruction technique. COMPARISON:  Head CT on 08/25/2023, and cervical spine CT on 04/10/2021 FINDINGS: CT HEAD FINDINGS Brain: No evidence of intracranial hemorrhage, acute infarction, hydrocephalus, extra-axial collection, or mass lesion/mass effect. Mild chronic small vessel disease again noted. Vascular:  No hyperdense vessel or other acute findings. Skull: No evidence of fracture or other significant bone abnormality. Sinuses/Orbits:  No acute findings. Other: None. CT CERVICAL SPINE FINDINGS Alignment: Normal. Skull base and vertebrae: No acute fracture. No primary bone lesion or focal pathologic process. Soft tissues and spinal canal: No prevertebral fluid  or swelling. No visible canal hematoma. Disc levels: Atlantoaxial degenerative spurring noted. No disc space narrowing. Upper chest: No acute findings. Other: None. IMPRESSION: No acute intracranial abnormality. Mild chronic small vessel disease. No evidence of acute cervical spine fracture or subluxation. Atlantoaxial degenerative changes noted. Electronically Signed   By: Norleen DELENA Kil M.D.   On: 06/30/2024 15:50   CT Cervical Spine Wo Contrast Result Date: 06/30/2024 CLINICAL DATA:  Tripped and fell. Blunt trauma to head and neck. Pain. EXAM: CT HEAD WITHOUT CONTRAST CT CERVICAL SPINE WITHOUT CONTRAST TECHNIQUE: Multidetector CT imaging of the head and cervical spine was performed following the standard protocol without intravenous contrast. Multiplanar CT image reconstructions of the cervical spine were also generated. RADIATION DOSE REDUCTION: This exam was performed according to the departmental dose-optimization program which includes automated exposure control, adjustment of the mA and/or kV according to patient size and/or use of iterative reconstruction technique. COMPARISON:  Head CT on 08/25/2023, and cervical spine CT on 04/10/2021 FINDINGS: CT HEAD FINDINGS Brain: No evidence of intracranial hemorrhage, acute infarction, hydrocephalus, extra-axial collection, or mass lesion/mass effect. Mild chronic small vessel disease again noted. Vascular:  No hyperdense vessel or other acute findings. Skull: No evidence of fracture or other significant bone abnormality. Sinuses/Orbits:  No acute findings. Other: None. CT CERVICAL SPINE FINDINGS Alignment: Normal. Skull base and vertebrae: No acute fracture. No primary bone lesion or focal pathologic process. Soft tissues and spinal canal: No prevertebral fluid or swelling. No visible canal hematoma. Disc levels: Atlantoaxial degenerative spurring noted. No disc space narrowing. Upper chest: No acute findings. Other: None. IMPRESSION: No acute intracranial  abnormality. Mild chronic small vessel disease. No  evidence of acute cervical spine fracture or subluxation. Atlantoaxial degenerative changes noted. Electronically Signed   By: Norleen DELENA Kil M.D.   On: 06/30/2024 15:50   DG Knee Complete 4 Views Left Result Date: 06/30/2024 CLINICAL DATA:  Pain after fall EXAM: LEFT KNEE - COMPLETE 4+ VIEW COMPARISON:  None Available. FINDINGS: Left knee total arthroplasty. No periprosthetic fracture or complications identified. No evidence of dislocation, or joint effusion. Degenerative changes of the patella. Soft tissues are unremarkable. IMPRESSION: Left knee total arthroplasty without evidence of radiographic complication. Electronically Signed   By: Megan  Zare M.D.   On: 06/30/2024 15:26   DG Hip Unilat W or Wo Pelvis 2-3 Views Left Result Date: 06/30/2024 CLINICAL DATA:  Fall.  LEFT hip pain EXAM: DG HIP (WITH OR WITHOUT PELVIS) 2-3V LEFT COMPARISON:  None Available. FINDINGS: LEFT total hip arthroplasty. No evidence of acute fracture or dislocation. IMPRESSION: 1. No evidence of acute fracture or dislocation. 2. LEFT total hip arthroplasty. Electronically Signed   By: Jackquline Boxer M.D.   On: 06/30/2024 15:22    EKG:  Personally reviewed, sinus rhythm, borderline LVH no acute ischemic changes  ED Course:  Treated with morphine , fentanyl , Zofran . EDP consulted with orthopedic surgery Dr. Reyne who has seen the patient, recommends for weightbearing as tolerated, physical therapy evaluation, and follow-up with surgeon Dr. Daldorff in 2 weeks.    Assessment/Plan:  68 y.o. female with hx anemia of chronic disease, hypertension, type 2 diabetes, history of CVA, stage IIIa CKD, psoriatic arthritis, osteoporosis, moderate persistent asthma, hyperlipidemia, GERD, anxiety and depression, who presented after a ground-level fall complicated by a left periprostatic greater trochanteric fracture  Ground-level fall Left periprosthetic greater trochanteric  fracture -EDP consulted with orthopedic surgery Dr. Reyne who has seen the patient, recommends for weightbearing as tolerated no active abduction of the L hip, physical therapy evaluation, and follow-up with surgeon Dr. Daldorff in 2 weeks.  -PT/OT evaluation, WBAT  -Check orthostatics - Pain control: Tylenol  as needed mild, methocarbamol  as needed spasm, oxycodone  2.5/5 mg every 4 hours as needed for moderate/severe, Dilaudid  0.5 mg IV Q 4 hr prn for breakthrough.  Chronic medical problems: CVA: continue home Aspirin , pravastatin  Hypertension: Continue home amlodipine , losartan , diltiazem  Hyperlipidemia: See CVA diabetes type 2: On metformin , Ozempic  outpatient.  Not currently requiring SSI CKD 3A: At baseline creatinine near 1 moderate persistent asthma: Continue home beclomethasone equivalent, nebs as needed anemia of chronic disease: Hemoglobin stable/actually up from baseline at 10.9.  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 On skyrizi outpatient.  Osteoporosis: Noted, check vitamin D  can follow-up for additional treatment outpatient GERD: Sub PPI mood disorder: Continue home duloxetine , buspirone  ? RLS: Continue home primidone     There is no height or weight on file to calculate BMI.    DVT prophylaxis:  Lovenox  Code Status:  Full Code Diet:  Diet Orders (From admission, onward)    None      Family Communication:  None   Consults:  Orthopedics   Admission status:   Observation, Med-Surg  Severity of Illness: The appropriate patient status for this patient is OBSERVATION. Observation status is judged to be reasonable and necessary in order to provide the required intensity of service to ensure the patient's safety. The patient's presenting symptoms, physical exam findings, and initial radiographic and laboratory data in the context of their medical condition is felt to place them at decreased risk for further clinical deterioration.  Furthermore, it is anticipated that the patient will be medically  stable for discharge from the hospital within 2 midnights of admission.    Dorn Dawson, MD [facility]  How to contact the [facility] Attending or Consulting provider 7A - 7P or covering provider during after hours 7P -7A, for this patient.  Check the care team in [facility] and look for a) attending/consulting [facility] provider listed and b) the [facility] team listed Log into [email or web address] and use [facility] universal password to access. If you do not have the password, please contact the hospital operator. Locate the [facility] provider you are looking for under [facility] and page to a number that you can be directly reached. If you still have difficulty reaching the provider, please page the [facility] (Director on Call) for the Hospitalists listed on amion for assistance.  06/30/2024, 8:22 PM

## 2024-06-30 NOTE — Progress Notes (Signed)
 Patient arrived in the unit transferred to bed assisted by this RN and Tech from ED. Initial assessments done. Patient complaining of pain on Left Hip. Patient stated that she fell and hit her head. Upon assessment there is a bump in the left tempo occipital area of the head.

## 2024-06-30 NOTE — Consult Note (Signed)
 Orthopedic Consult  Patient ID: Carrie [name] MRN: [id number] DOB/AGE: [birth year] 68 y.o.  Reason for Consult: Left hip pain Referring Physician: Jerrol  HPI: Carrie [name] is an 68 y.o. female who tripped and fell earlier today landing on her left hip.  She has a history of prior total hip replacement with Dr.Dalldorf approximately a year ago.  She was doing well with this other than some slight thigh pain prior to her fall.  She reports bumping her head but no loss of consciousness.  She denies any other injuries or results of her fall.  She had a prior left total shoulder replacement due to a previous fall.  She is still in therapy for this.  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   Liver cirrhosis (HCC)    Nausea and vomiting 01/06/2013   Neuropathy    Bil feet re: to Diabetes   Obesity    Osteoporosis    Plantar fasciitis    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   Stricture and stenosis of esophagus    Stroke (HCC)    TIA (transient ischemic attack)    8-10 yrs ago, no problems since   Tuberculosis    tested positive 2011, no symptoms, was on medicine for 6 months    Past Surgical History:  Procedure Laterality Date   BREAST SURGERY Bilateral    biopsies both breast   CARDIAC CATHETERIZATION  08/30/2010   CESAREAN SECTION     x 2    CHOLECYSTECTOMY     COLONOSCOPY     FOOT SURGERY Left    spur removal   HERNIA REPAIR     umbilical hernia repaired at the same time as cholecystectomy   IR FLUORO GUIDE CV LINE RIGHT  04/12/2021   IR US  GUIDE  VASC ACCESS RIGHT  04/12/2021   JOINT REPLACEMENT     bilat knee    KNEE SURGERY Bilateral    x 2 joint knee replacements   KYPHOPLASTY N/A 03/28/2022   Procedure: LUMBAR ONE KYPHOPLASTY;  Surgeon: Beuford Anes, MD;  Location: [facility];  Service: Orthopedics;  Laterality: N/A;  LUMBAR ONE KYPHOPLASTY   REVERSE SHOULDER ARTHROPLASTY Left 03/06/2024   Procedure: ARTHROPLASTY, SHOULDER, TOTAL, REVERSE;  Surgeon: Dozier Soulier, MD;  Location: [facility];  Service: Orthopedics;  Laterality: Left;   SHOULDER ARTHROSCOPY Right 12/18/2022   Procedure: RIGHT SHOULDER ARTHROSCOPY, ACROMIOPLASTY AND DEBRIDEMENT;  Surgeon: Sheril Coy, MD;  Location: [facility];  Service: Orthopedics;  Laterality: Right;   TOTAL HIP ARTHROPLASTY Left 10/26/2022   Procedure: TOTAL HIP ARTHROPLASTY ANTERIOR APPROACH;  Surgeon: Yvone Rush, MD;  Location: [facility];  Service: Orthopedics;  Laterality: Left;   TOTAL KNEE REVISION Left 10/03/2021   Procedure: LEFT TOTAL KNEE REVISION POLY SWAP WITH IRRIGATION AND DEBRIDEMENT;  Surgeon: Sheril Coy, MD;  Location: [facility];  Service: Orthopedics;  Laterality: Left;   UPPER GASTROINTESTINAL ENDOSCOPY  WISDOM TOOTH EXTRACTION      Family History  Problem Relation Age of Onset   Diabetes Other        maternal aunts and uncles   Diabetes Father    Liver disease Father    Emphysema Father        smoked   Diabetes Sister    Diabetes Brother    Sarcoidosis Brother    Emphysema Mother        smoked   Asthma Mother    Asthma Brother    Colon cancer Neg Hx    Esophageal cancer Neg Hx    Stomach cancer Neg Hx    Rectal cancer Neg Hx     Social History:  reports that she has never smoked. She has never been exposed to tobacco smoke. She has never used smokeless tobacco. She reports that she does not currently use alcohol. She reports that she does not use drugs.  Allergies:  Allergies  Allergen Reactions   Lisinopril Cough    Medications: I have reviewed the  patient's current medications.  ROS: Constitutional: No fever or chills Vision: No changes in vision ENT: No difficulty swallowing CV: No chest pain Pulm: No SOB or wheezing GI: No nausea or vomiting GU: No urgency or inability to hold urine Skin: No poor wound healing Neurologic: No numbness or tingling Psychiatric: No depression or anxiety Heme: No bruising Allergic: No reaction to medications or food   Exam: Blood pressure (!) 135/92, pulse 100, temperature 98.1 F (36.7 C), temperature source Oral, resp. rate 16, SpO2 94%. General: Well-appearing woman in no acute distress Orientation: Alert and oriented Mood and Affect: Mood is calm Gait: Not assessed Coordination and balance: Not assessed  Legs are equal length.  She is tender to palpation over the left greater troches.  No significant bruising.  Healed surgical incision over the left knee with no tenderness to palpation.  No tense about the ankle or foot.  Intact sensation in the saphenous, sural, tibial, and peroneal nerve distributions.  5 out of 5 strength EHL FHL gastrocs and tibialis anterior  Examination of her right lower extremity reveals on test patient no crepitus defects or deformities.  Examination of bilateral upper extremities reveals mild tenderness about her left shoulder consistent with her preoperative pain.  Also some mild pain with range of motion of her right shoulder but again consistent with the pain she had prior.  No crepitus defects or deformities     Medical Decision Making: Data: Imaging: CT scan of the left hip reveals a mid displaced periprosthetic greater trochanteric fracture.  The stem appears to be stable.   Assessment/Plan: Left periprosthetic greater trochanteric fracture.  The patient does have a periprosthetic fracture however the stem appears to be stable.  The fracture pattern is quite stable.  This can be treated nonoperatively.  She can weight-bear as tolerated but with no  active abduction.  She should use a walker for mobilization.  She should be seen evaluate by physical therapy to make sure she is safe for mobilization.  When she is cleared from therapy, she can be discharged to home to follow-up with her surgeon, Dr. Daldorff in 2 weeks.  New problem w/ workup planned: High complexity diagnosis (Level 5) Surgery w/ risks or Emergency surgery: High complexity Risk (Level 5)  All others are Level 4 with comprehensive musculoskeletal exam.  Cordella Rhein, MD, MS [facility] [phone]

## 2024-06-30 NOTE — ED Notes (Signed)
 Difficulty with IV start attempt. IV team consult to be placed.

## 2024-06-30 NOTE — ED Provider Notes (Signed)
  Physical Exam  BP (!) 157/102   Pulse 92   Temp 97.8 F (36.6 C) (Oral)   Resp 15   LMP  (LMP Unknown)   SpO2 98%   Physical Exam  Procedures  Procedures  ED Course / MDM    Medical Decision Making Amount and/or Complexity of Data Reviewed Labs: ordered. Radiology: ordered.  Risk Prescription drug management. Decision regarding hospitalization.   34F presenting after a fall, waiting on imaging pain control and reassessment.    CT Head and Cervical Spine: IMPRESSION:  No acute intracranial abnormality. Mild chronic small vessel  disease.    No evidence of acute cervical spine fracture or subluxation.  Atlantoaxial degenerative changes noted.   XR Knee:  IMPRESSION:  Left knee total arthroplasty without evidence of radiographic  complication.   XR Hip L and Pelvis: IMPRESSION:  1. No evidence of acute fracture or dislocation.  2. LEFT total hip arthroplasty.   Pt with significant pain in the left hip, suspect periprosthetic fx. Will obtain CT.  CT L Hip:  IMPRESSION:  Comminuted mildly displaced fracture through the greater trochanter  that extends to the lateral aspect of the femoral component of left  hip arthroplasty.    Ortho: Spoke with Dr. Cloretta, recommended medicine admission, will see the patient.  Medicine: Consulted Dr. Renold who accepted the patient in admission.   Jerrol Agent, MD 06/30/24 2019

## 2024-06-30 NOTE — ED Provider Notes (Signed)
 [place] EMERGENCY DEPARTMENT AT [facility] Provider Note   CSN: [id number] Arrival date & time: 06/30/24  1357     Patient presents with: [place]   Carrie [name] is a 68 y.o. female.  {Add pertinent medical, surgical, social history, OB history to HPI:32947} Patient is a 68 year old female with a history of hypertension, TIA, gastroparesis, hyperlipidemia, GERD, asthma, diabetes who is presenting today after a fall at home.  Patient reports having a shoulder surgery done a few months ago and a hip replacement on the left done a few months before that and reports she was coming down the stairs to get something that was being dropped off by [employer] which is a medication that has to be refrigerated.  After she got off the last step she was leaning over to get the medication when she reports her left leg that she had the replacement and caused her to feel off balance and she fell onto the left side also hitting the back of her head on the concrete.  She did not have any loss of consciousness.  Does take a baby aspirin  but no other anticoagulation.  Denies any nausea or vomiting but reports that since the fall she is having significant pain in her head, some mild pain in the left side of her neck and severe pain in the left hip and knee.  She was unable to ambulate after this fall.  Denies any upper extremity pain and reports her left side feels fine.  The history is provided by the patient.  Fall       Prior to Admission medications   Medication Sig Start Date End Date Taking? Authorizing Provider  Accu-Chek Softclix Lancets lancets Use as instructed to check blood sugar 1-2 times daily Dx E11.9 10/23/23   Trixie File, MD  acetaminophen  (TYLENOL ) 500 MG tablet Take 1,000 mg by mouth every 6 (six) hours as needed for moderate pain (pain score 4-6).    [provider]  albuterol  (VENTOLIN  HFA) 108 (90 Base) MCG/ACT inhaler Inhale 2 puffs into the lungs every 4 (four) hours  as needed for wheezing or shortness of breath (or coughing).    [provider]  amLODipine  (NORVASC ) 2.5 MG tablet Take 2.5 mg by mouth daily. 09/03/22   [provider]  aspirin  EC 81 MG tablet Take 1 tablet (81 mg total) by mouth 2 (two) times daily. Swallow whole. 03/06/24   [place], Hospital doctor, PA-C  Blood Glucose Monitoring Suppl (ACCU-CHEK GUIDE ME) w/Device KIT USE AS INSTRUCTED TO CHECK BLOOD SUGAR 2 TIMES DAILY DX E11.9 12/25/23   Trixie File, MD  busPIRone  (BUSPAR ) 5 MG tablet Take 10 mg by mouth at bedtime.    [provider]  Calcium  Carb-Cholecalciferol (CALCIUM  CARBONATE-VITAMIN D3 PO) Take 1 tablet by mouth daily.    [provider]  clotrimazole -betamethasone (LOTRISONE) cream Apply 1 Application topically 2 (two) times daily as needed (rash). 02/24/24   [provider]  Continuous Glucose Sensor (FREESTYLE LIBRE 3 PLUS SENSOR) MISC 1 each by Does not apply route every 14 (fourteen) days. 06/08/24   Trixie File, MD  denosumab  (PROLIA ) 60 MG/ML SOSY injection Inject 60 mg into the skin every 6 (six) months.    [provider]  diltiazem  (CARDIZEM  CD) 240 MG 24 hr capsule Take 240 mg by mouth daily.    [provider]  DULoxetine  (CYMBALTA ) 60 MG capsule Take 60 mg by mouth daily. 09/28/22   [provider]  ferrous sulfate   325 (65 FE) MG EC tablet Take 325 mg by mouth daily.    [provider]  glucose blood (ACCU-CHEK GUIDE TEST) test strip USE 1 STRIP 2 (TWO) TIMES DAILY. 03/25/24   Trixie File, MD  losartan  (COZAAR ) 100 MG tablet TAKE 1 TABLET BY MOUTH EVERY DAY IN THE MORNING 02/03/23   Trixie File, MD  Melatonin 12 MG TABS Take 12 mg by mouth at bedtime as needed (sleep).    [provider]  metFORMIN  (GLUCOPHAGE -XR) 500 MG 24 hr tablet Take 1 tablet (500 mg total) by mouth at bedtime. Patient taking differently: Take 500 mg by mouth 2 (two) times daily. 08/15/23   Motwani,  Komal, MD  mirabegron  ER (MYRBETRIQ ) 25 MG TB24 tablet Take 25 mg by mouth daily.    [provider]  omeprazole (PRILOSEC) 40 MG capsule Take 40 mg by mouth 2 (two) times daily. 02/05/24 08/03/24  [provider]  pravastatin  (PRAVACHOL ) 80 MG tablet Take 80 mg by mouth daily.    [provider]  primidone  (MYSOLINE ) 250 MG tablet Take 125 mg by mouth 2 (two) times daily. 12/03/22   [provider]  QVAR REDIHALER 80 MCG/ACT inhaler Inhale 1 puff into the lungs 2 (two) times daily.    [provider]  Semaglutide , 1 MG/DOSE, (OZEMPIC , 1 MG/DOSE,) 4 MG/3ML SOPN Inject 1 mg into the skin once a week. 02/20/24   Trixie File, MD  STELARA 45 MG/0.5ML injection Inject 45 mg into the skin every 3 (three) months. 07/23/23   [provider]    Allergies: Lisinopril    Review of Systems  Updated Vital Signs LMP  (LMP Unknown)   SpO2 99%   Physical Exam Vitals and nursing note reviewed.  Constitutional:      General: She is not in acute distress.    Appearance: She is well-developed.  HENT:     Head: Normocephalic and atraumatic.   Eyes:     Pupils: Pupils are equal, round, and reactive to light.  Neck:   Cardiovascular:     Rate and Rhythm: Normal rate and regular rhythm.     Pulses: Normal pulses.     Heart sounds: Normal heart sounds. No murmur heard.    No friction rub.  Pulmonary:     Effort: Pulmonary effort is normal.     Breath sounds: Normal breath sounds. No wheezing or rales.  Abdominal:     General: Bowel sounds are normal. There is no distension.     Palpations: Abdomen is soft.     Tenderness: There is no abdominal tenderness. There is no guarding or rebound.  Musculoskeletal:        General: Tenderness present.     Right hip: Normal.     Left hip: Tenderness present. Decreased range of motion.     Right knee: Normal.     Left knee: Decreased range of motion. Tenderness present.     Right lower leg: No edema.      Left lower leg: No edema.     Right ankle: Normal.     Left ankle: Normal.     Comments: No edema.  Left leg is slightly internally rotated but no significant shortening  Skin:    General: Skin is warm and dry.     Findings: No rash.  Neurological:     Mental Status: She is alert and oriented to person, place, and time. Mental status is at baseline.     Cranial Nerves: No  cranial nerve deficit.  Psychiatric:        Behavior: Behavior normal.     (all labs ordered are listed, but only abnormal results are displayed) Labs Reviewed - No data to display  EKG: None  Radiology: No results found.  {Document cardiac monitor, telemetry assessment procedure when appropriate:32947} Procedures   Medications Ordered in the ED  ondansetron  (ZOFRAN ) injection 4 mg (has no administration in time range)  morphine  (PF) 4 MG/ML injection 4 mg (has no administration in time range)      {Click here for ABCD2, HEART and other calculators REFRESH Note before signing:1}                              Medical Decision Making Amount and/or Complexity of Data Reviewed Labs: ordered. Decision-making details documented in ED Course. Radiology: ordered and independent interpretation performed. Decision-making details documented in ED Course.  Risk Prescription drug management.   Pt with multiple medical problems and comorbidities and presenting today with a complaint that caries a high risk for morbidity and mortality.  Patient with a fall today which seems more mechanical in nature.  She is awake and alert and denies any loss of consciousness.  Low suspicion for syncope as the cause of her fall.  Concern for possible intracranial injury, cervical injury, hip, femur or knee injury.  Imaging is pending.  Patient given pain control.  She does not take any anticoagulation and has no open wounds at this time.  She is mentating normally.   {Document critical care time when appropriate  Document  review of labs and clinical decision tools ie CHADS2VASC2, etc  Document your independent review of radiology images and any outside records  Document your discussion with family members, caretakers and with consultants  Document social determinants of health affecting pt's care  Document your decision making why or why not admission, treatments were needed:32947:::1}   Final diagnoses:  None    ED Discharge Orders     None

## 2024-06-30 NOTE — Plan of Care (Signed)
  Problem: Clinical Measurements: Goal: Will remain free from infection Outcome: Progressing   Problem: Activity: Goal: Risk for activity intolerance will decrease Outcome: Progressing   Problem: Pain Managment: Goal: General experience of comfort will improve and/or be controlled Outcome: Progressing   Problem: Skin Integrity: Goal: Risk for impaired skin integrity will decrease Outcome: Progressing   Problem: Skin Integrity: Goal: Risk for impaired skin integrity will decrease Outcome: Progressing

## 2024-06-30 NOTE — ED Triage Notes (Signed)
 Pt bib ems from home. Pt had a mechanical fall after tripping on toy truck. It back of the head and left hip. Pain to head and hip. Recent hip replacement in May. -loc, -thinners.

## 2024-07-01 ENCOUNTER — Ambulatory Visit: Admitting: Physical Therapy

## 2024-07-01 ENCOUNTER — Encounter (HOSPITAL_COMMUNITY): Payer: Self-pay | Admitting: Internal Medicine

## 2024-07-01 DIAGNOSIS — S72102A Unspecified trochanteric fracture of left femur, initial encounter for closed fracture: Secondary | ICD-10-CM | POA: Diagnosis not present

## 2024-07-01 DIAGNOSIS — S72112A Displaced fracture of greater trochanter of left femur, initial encounter for closed fracture: Secondary | ICD-10-CM | POA: Diagnosis not present

## 2024-07-01 MED ORDER — LORATADINE 10 MG PO TABS
10.0000 mg | ORAL_TABLET | Freq: Every day | ORAL | Status: DC | PRN
  Administered 2024-07-01: 10 mg via ORAL
  Filled 2024-07-01: qty 1

## 2024-07-01 MED ORDER — CLOTRIMAZOLE 1 % EX CREA
TOPICAL_CREAM | Freq: Two times a day (BID) | CUTANEOUS | Status: DC
Start: 2024-07-01 — End: 2024-07-06
  Filled 2024-07-01: qty 15

## 2024-07-01 MED ORDER — CAMPHOR-MENTHOL 0.5-0.5 % EX LOTN
TOPICAL_LOTION | CUTANEOUS | Status: DC | PRN
  Filled 2024-07-01: qty 222

## 2024-07-01 NOTE — NC FL2 (Signed)
 [place]  MEDICAID FL2 LEVEL OF CARE FORM     IDENTIFICATION  Patient Name: Carrie [name] Birthdate: [birth year] Sex: female Admission Date (Current Location): 06/30/2024  [facility] and [place] Number:  Producer, television/film/video and Address:  [facility] [place]. [facility], [address] [place], [place] [address]      Provider Number: [id number]  Attending Physician Name and Address:  Mcarthur Pick, MD  Relative Name and Phone Number:       Current Level of Care: Hospital Recommended Level of Care: Skilled Nursing Facility Prior Approval Number:    Date Approved/Denied:   PASRR Number: PASRR Pending  Discharge Plan: SNF    Current Diagnoses: Patient Active Problem List   Diagnosis Date Noted   Fracture of femur, trochanteric (HCC) 06/30/2024   S/P reverse total shoulder arthroplasty, left 03/06/2024   Acute on chronic anemia 03/05/2024   OP (osteoporosis) 10/21/2023   Moderate persistent asthma 10/26/2022   Stage 3a chronic kidney disease (CKD) (HCC) 04/06/2022   Microcytic anemia 03/11/2022   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/16/2020   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/2020   Psoriatic arthritis (HCC) 07/08/2017   Mild neurocognitive disorder 10/18/2016   DM2 (diabetes mellitus, type 2) (HCC) 12/04/2013   Benign essential HTN 04/21/2013   Hyperlipidemia 01/09/2008   OBESITY 01/09/2008   GERD with esophageal stricture and stenosis s/p dilation  01/09/2008   HERNIA, VENTRAL 01/09/2008   Anxiety and depression 03/25/2007    Orientation RESPIRATION BLADDER Height & Weight     Time, Situation, Place, Self  Normal Incontinent Weight: 146 lb 8.3 oz (66.5 kg) Height:  5' (152.4 cm)  BEHAVIORAL SYMPTOMS/MOOD NEUROLOGICAL BOWEL NUTRITION STATUS      Continent Diet (See DC Summary)  AMBULATORY STATUS COMMUNICATION OF NEEDS Skin   Extensive Assist Verbally Normal                       Personal Care Assistance Level of Assistance   Bathing, Feeding, Dressing Bathing Assistance: Maximum assistance Feeding assistance: Limited assistance Dressing Assistance: Maximum assistance     Functional Limitations Info  Sight, Speech, Hearing Sight Info: Adequate Hearing Info: Adequate Speech Info: Adequate    SPECIAL CARE FACTORS FREQUENCY  PT (By licensed PT), OT (By licensed OT)     PT Frequency: 5x/week OT Frequency: 5x/week            Contractures Contractures Info: Not present    Additional Factors Info  Code Status, Allergies Code Status Info: Full Allergies Info: Lisinopril           Current Medications (07/01/2024):  This is the current hospital active medication list Current Facility-Administered Medications  Medication Dose Route Frequency Provider Last Rate Last Admin   acetaminophen  (TYLENOL ) tablet 1,000 mg  1,000 mg Oral Q6H PRN Segars, Dorn, MD       albuterol  (PROVENTIL ) (2.5 MG/3ML) 0.083% nebulizer solution 2.5 mg  2.5 mg Nebulization Q4H PRN Segars, Dorn, MD       amLODipine  (NORVASC ) tablet 2.5 mg  2.5 mg Oral Daily Segars, Dorn, MD   2.5 mg at 07/01/24 9073   aspirin  EC tablet 81 mg  81 mg Oral Daily Segars, Jonathan, MD   81 mg at 07/01/24 9073   budesonide  (PULMICORT ) nebulizer solution 0.25 mg  0.25 mg Nebulization BID Segars, Jonathan, MD   0.25 mg at 07/01/24 9081   busPIRone  (BUSPAR ) tablet 10 mg  10 mg Oral QHS Segars,  Dorn, MD   10 mg at 06/30/24 2307   diltiazem  (CARDIZEM  CD) 24 hr capsule 240 mg  240 mg Oral Daily Segars, Jonathan, MD   240 mg at 07/01/24 9074   DULoxetine  (CYMBALTA ) DR capsule 60 mg  60 mg Oral Daily Segars, Jonathan, MD   60 mg at 07/01/24 9073   enoxaparin  (LOVENOX ) injection 40 mg  40 mg Subcutaneous Q24H Segars, Jonathan, MD   40 mg at 07/01/24 9074   HYDROmorphone  (DILAUDID ) injection 0.5 mg  0.5 mg Intravenous Q4H PRN Segars, Jonathan, MD   0.5 mg at 07/01/24 0300   losartan  (COZAAR ) tablet 100 mg  100 mg Oral Daily Segars, Dorn, MD    100 mg at 07/01/24 9073   melatonin tablet 6 mg  6 mg Oral QHS PRN Segars, Jonathan, MD       methocarbamol  (ROBAXIN ) tablet 500 mg  500 mg Oral Q8H PRN Segars, Jonathan, MD   500 mg at 07/01/24 0645   ondansetron  (ZOFRAN ) injection 4 mg  4 mg Intravenous Q6H PRN Keturah Dorn, MD       oxyCODONE  (Oxy IR/ROXICODONE ) immediate release tablet 2.5 mg  2.5 mg Oral Q4H PRN Keturah Dorn, MD       Or   oxyCODONE  (Oxy IR/ROXICODONE ) immediate release tablet 5 mg  5 mg Oral Q4H PRN Segars, Jonathan, MD   5 mg at 07/01/24 0933   pantoprazole  (PROTONIX ) EC tablet 40 mg  40 mg Oral Q0600 Segars, Jonathan, MD   40 mg at 07/01/24 9376   polyethylene glycol (MIRALAX  / GLYCOLAX ) packet 17 g  17 g Oral Daily PRN Segars, Jonathan, MD       pravastatin  (PRAVACHOL ) tablet 80 mg  80 mg Oral Daily Segars, Jonathan, MD   80 mg at 07/01/24 9073   primidone  (MYSOLINE ) tablet 125 mg  125 mg Oral BID Segars, Jonathan, MD   125 mg at 07/01/24 9073   Facility-Administered Medications Ordered in Other Encounters  Medication Dose Route Frequency Provider Last Rate Last Admin   denosumab  (PROLIA ) injection 60 mg  60 mg Subcutaneous Once Hawkes, Angela, MD         Discharge Medications: Please see discharge summary for a list of discharge medications.  Relevant Imaging Results:  Relevant Lab Results:   Additional Information SSN: [id number]  Jeoffrey LITTIE Moose, LCSWA

## 2024-07-01 NOTE — Evaluation (Signed)
 Occupational Therapy Evaluation Patient Details Name: Carrie [name] MRN: [id number] DOB: January 14, 1956 Today's Date: 07/01/2024   History of Present Illness   Carrie [name] is a 68 y.o. female who presented to [facility] ED 06/30/24 after mechanical fall, sustaining left periprosthetic greater trochanteric fracture. Plan for non-operative management. PMHx: CVA, HTN, HLD, T2DM, CKD 3A, moderate persistent asthma, anemia of chronic disease, psoriatic arthritis, osteoporosis, GERD, and mood disorder.     Clinical Impressions Pt c/o pain to L hip 7/10. Pt lives at home with son and his wife and their 2 kids, PLOF mod I with cane, history of falls. Pt overall mod I for ADLs, has had difficulty don/doff socks/shoes, wears slip ons. Pt currently significantly limited with LB ADLs, mod A for STS and pivot transfer to recliner. Pt instructed on precautions, no active L hip ABD. Pt mod A in/out of bed. Recommending postacute rehab <3hrs/day to maximize functional mobility and strength, independence with ADLs, will continue to follow acutely to progress as able.      If plan is discharge home, recommend the following:   Two people to help with walking and/or transfers;A lot of help with bathing/dressing/bathroom;Assistance with cooking/housework;Assist for transportation;Help with stairs or ramp for entrance     Functional Status Assessment   Patient has had a recent decline in their functional status and demonstrates the ability to make significant improvements in function in a reasonable and predictable amount of time.     Equipment Recommendations   Other (comment) (defer)     Recommendations for Other Services         Precautions/Restrictions   Precautions Precautions: Fall Recall of Precautions/Restrictions: Impaired Precaution/Restrictions Comments: Educated pt on weight-bearing restrictions; No active hip abduction. Fearful of falling Restrictions Weight Bearing Restrictions Per  Provider Order: Yes RLE Weight Bearing Per Provider Order: Weight bearing as tolerated Other Position/Activity Restrictions: No active right hip abduction     Mobility Bed Mobility Overal bed mobility: Needs Assistance Bed Mobility: Supine to Sit, Sit to Supine     Supine to sit: Mod assist, HOB elevated Sit to supine: Mod assist   General bed mobility comments: mod A in/out of bed, assist for BLEs    Transfers                   General transfer comment: declined due to pain      Balance Overall balance assessment: Needs assistance Sitting-balance support: No upper extremity supported, Feet supported Sitting balance-Leahy Scale: Fair Sitting balance - Comments: EOB                                   ADL either performed or assessed with clinical judgement   ADL Overall ADL's : Needs assistance/impaired Eating/Feeding: Independent   Grooming: Set up;Sitting   Upper Body Bathing: Set up;Sitting   Lower Body Bathing: Total assistance;Sitting/lateral leans;Sit to/from stand   Upper Body Dressing : Set up;Sitting   Lower Body Dressing: Total assistance;Sitting/lateral leans;Sit to/from stand                 General ADL Comments: Pt has difficulty with LB ADLs, aware of precautions no active L hip ABD. Pt with L shoudler arthroplasty 2-3 months ago, currently getting therapy, history of R shoudler problems, but overall able to complete UB ADLs     Vision Baseline Vision/History: 1 Wears glasses Ability to See in Adequate Light: 0 Adequate Patient  Visual Report: No change from baseline       Perception         Praxis         Pertinent Vitals/Pain Pain Assessment Pain Assessment: 0-10 Pain Score: 7  Pain Location: L hip Pain Descriptors / Indicators: Guarding, Grimacing, Discomfort, Moaning, Aching, Sore, Tender Pain Intervention(s): Monitored during session     Extremity/Trunk Assessment Upper Extremity Assessment Upper  Extremity Assessment: LUE deficits/detail LUE Deficits / Details: total shoulder surgery, still getting OP therapy for it. LUE Sensation: WNL LUE Coordination: WNL           Communication Communication Communication: No apparent difficulties   Cognition Arousal: Alert Behavior During Therapy: WFL for tasks assessed/performed Cognition: No apparent impairments                               Following commands: Intact       Cueing  General Comments   Cueing Techniques: Verbal cues;Gestural cues      Exercises     Shoulder Instructions      Home Living Family/patient expects to be discharged to:: Private residence Living Arrangements: Children Available Help at Discharge: Family;Available PRN/intermittently Type of Home: House Home Access: Stairs to enter [employer] of Steps: 3 Entrance Stairs-Rails: Left;Right;Can reach both Home Layout: One level     Bathroom Shower/Tub: Chief Strategy Officer: Standard     Home Equipment: Cane - single point;Rollator (4 wheels);Transport chair   Additional Comments: Pt lives with son and wife  and their 2 kids. Reports she is still recieving therapy for her shoulder, OPPT 2x/wk.      Prior Functioning/Environment Prior Level of Function : Independent/Modified Independent;History of Falls (last six months);Driving             Mobility Comments: Indep without AD. Several falls in the past 9mo, hx of injuring left hip and left shoulder. ADLs Comments: ModI with ADLs/IADLs. Retired.    OT Problem List: Decreased strength;Decreased range of motion;Decreased activity tolerance;Impaired balance (sitting and/or standing);Pain   OT Treatment/Interventions: Self-care/ADL training;Therapeutic exercise;Energy conservation;DME and/or AE instruction;Therapeutic activities;Patient/family education;Balance training      OT Goals(Current goals can be found in the care plan section)   Acute  Rehab OT Goals Patient Stated Goal: to manage pain OT Goal Formulation: With patient Time For Goal Achievement: 07/15/24 Potential to Achieve Goals: Good   OT Frequency:  Min 2X/week    Co-evaluation              AM-PAC OT 6 Clicks Daily Activity     Outcome Measure Help from another person eating meals?: None Help from another person taking care of personal grooming?: A Little Help from another person toileting, which includes using toliet, bedpan, or urinal?: A Lot Help from another person bathing (including washing, rinsing, drying)?: A Lot Help from another person to put on and taking off regular upper body clothing?: A Little Help from another person to put on and taking off regular lower body clothing?: Total 6 Click Score: 15   End of Session Nurse Communication: Mobility status  Activity Tolerance: Patient limited by pain Patient left: in bed;with call bell/phone within reach  OT Visit Diagnosis: Unsteadiness on feet (R26.81);Other abnormalities of gait and mobility (R26.89);Repeated falls (R29.6);Muscle weakness (generalized) (M62.81);History of falling (Z91.81);Pain Pain - Right/Left: Left Pain - part of body: Hip  Time: 8754-8687 OT Time Calculation (min): 27 min Charges:  OT General Charges $OT Visit: 1 Visit OT Evaluation $OT Eval Moderate Complexity: 1 Mod OT Treatments $Self Care/Home Management : 8-22 mins  Rowena, OTR/L   Elouise JONELLE Bott 07/01/2024, 1:31 PM

## 2024-07-01 NOTE — TOC Initial Note (Signed)
 Transition of Care [facility]) - Initial/Assessment Note    Patient Details  Name: Carrie [name] MRN: [id number] Date of Birth: 1956-04-08  [facility] CM/SW Contact:    Jeoffrey LITTIE Maranda ISRAEL Phone Number: 07/01/2024, 12:27 PM  Clinical Narrative:                 Pt admitted from home due to mechanical fall. CSW completed SNF workup with pt at bedside. Pt agreeable to discharge plan and stated [facility] was her first choice in facility. CSW completed Fl2 and sent out SNF referrals. CSW will follow up to provide bed offers.  Expected Discharge Plan: Skilled Nursing Facility Barriers to Discharge: English as a second language teacher, SNF Pending bed offer, Continued Medical Work up   Patient Goals and CMS Choice Patient states their goals for this hospitalization and ongoing recovery are:: SNF          Expected Discharge Plan and Services       Living arrangements for the past 2 months: Single Family Home                                      Prior Living Arrangements/Services Living arrangements for the past 2 months: Single Family Home Lives with:: Self Patient language and need for interpreter reviewed:: Yes Do you feel safe going back to the place where you live?: Yes      Need for Family Participation in Patient Care: No (Comment) Care giver support system in place?: Yes (comment)   Criminal Activity/Legal Involvement Pertinent to Current Situation/Hospitalization: No - Comment as needed  Activities of Daily Living   ADL Screening (condition at time of admission) Independently performs ADLs?: Yes (appropriate for developmental age) Is the patient deaf or have difficulty hearing?: No Does the patient have difficulty seeing, even when wearing glasses/contacts?: No Does the patient have difficulty concentrating, remembering, or making decisions?: No  Permission Sought/Granted Permission sought to share information with : Facility Neurosurgeon granted to share info w AGENCY: SNFs        Emotional Assessment Appearance:: Appears stated age Attitude/Demeanor/Rapport: Engaged Affect (typically observed): Accepting Orientation: : Oriented to Self, Oriented to Place, Oriented to  Time, Oriented to Situation Alcohol / Substance Use: Not Applicable Psych Involvement: No (comment)  Admission diagnosis:  Fracture of femur, trochanteric (HCC) [S72.109A] Closed displaced fracture of greater trochanter of left femur, initial encounter (HCC) [S72.112A] Fall, initial encounter [W19.XXXA] Patient Active Problem List   Diagnosis Date Noted   Fracture of femur, trochanteric (HCC) 06/30/2024   S/P reverse total shoulder arthroplasty, left 03/06/2024   Acute on chronic anemia 03/05/2024   OP (osteoporosis) 10/21/2023   Moderate persistent asthma 10/26/2022   Stage 3a chronic kidney disease (CKD) (HCC) 04/06/2022   Microcytic anemia 03/11/2022   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/16/2020   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/2020   Psoriatic arthritis (HCC) 07/08/2017   Mild neurocognitive disorder 10/18/2016   DM2 (diabetes mellitus, type 2) (HCC) 12/04/2013   Benign essential HTN 04/21/2013   Hyperlipidemia 01/09/2008   OBESITY 01/09/2008   GERD with esophageal stricture and stenosis s/p dilation  01/09/2008   HERNIA, VENTRAL 01/09/2008   Anxiety and depression 03/25/2007   PCP:  Jolee Madelin Patch, MD Pharmacy:   [facility] [phone] - [place], [place] - [address] [place] [address] Phone: [phone] Fax: [phone]  [facility] - [place], [place] - [address] [place] [address] Phone: [phone] Fax: [phone]     Social Drivers of Health (SDOH) Social History: SDOH Screenings   Food Insecurity: No Food Insecurity (06/30/2024)  Housing: Low Risk  (06/30/2024)   Transportation Needs: No Transportation Needs (06/30/2024)  Utilities: Not At Risk (06/30/2024)  Depression (PHQ2-9): Low Risk  (04/03/2024)  Social Connections: Unknown (06/30/2024)  Tobacco Use: Low Risk  (07/01/2024)   SDOH Interventions:     Readmission Risk Interventions    03/06/2024    2:48 PM  Readmission Risk Prevention Plan  Transportation Screening Complete  PCP or Specialist Appt within 5-7 Days Complete  Home Care Screening Complete  Medication Review (RN CM) Complete

## 2024-07-01 NOTE — Progress Notes (Addendum)
 PROGRESS NOTE    LAVENE PENAGOS  FMW:[id number] DOB: 1956/09/21 DOA: 06/30/2024 PCP: Jolee Madelin Patch, MD   Brief Narrative:    Assessment & Plan:   Principal Problem:   Fracture of femur, trochanteric (HCC)  Carrie [name] is a 68 y.o. female with hx of CVA, hypertension, hyperlipidemia, diabetes type 2, CKD 3A, moderate persistent asthma, anemia of chronic disease, psoriatic arthritis, osteoporosis, GERD, mood disorder, who presented after ground-level fall.    Found to have left hip periprosthetic fracture, orthopedics have recommended conservative management  Ground-level fall Left periprosthetic greater trochanteric fracture - Dr. Reyne who has seen the patient, recommends for weightbearing as tolerated no active abduction of the L hip, physical therapy evaluation, and follow-up with surgeon Dr. Daldorff in 2 weeks.  -PT/OT evaluation---> recommendation for SNF, case management is on board , WBAT  -Check orthostatics - Pain control: Tylenol  as needed mild, methocarbamol  as needed spasm, oxycodone  2.5/5 mg every 4 hours as needed for moderate/severe, Dilaudid  0.5 mg IV Q 4 hr prn for breakthrough.   Chronic medical problems: CVA: continue home Aspirin , pravastatin  Hypertension: Continue home amlodipine , losartan , diltiazem  Hyperlipidemia: See CVA diabetes type 2: On metformin , Ozempic  outpatient.  Not currently requiring SSI CKD 3A: At baseline creatinine near 1 moderate persistent asthma: Continue home beclomethasone equivalent, nebs as needed anemia of chronic disease: Hemoglobin stable/actually up from baseline at 10.9.  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 On skyrizi outpatient.  Osteoporosis: Noted, check vitamin D  can follow-up for additional treatment outpatient GERD: Sub PPI mood disorder: Continue home duloxetine , buspirone  ? RLS: Continue home primidone      There is no height or weight on file to calculate BMI.      DVT prophylaxis:  Lovenox  Code Status:  Full Code Diet:     Consultants:   Procedures:   Antimicrobials:    Subjective:   Objective: Vitals:   06/30/24 2100 06/30/24 2230 07/01/24 0409 07/01/24 0747  BP: 132/74 (!) 163/72 (!) 143/70 134/64  Pulse: 95 96 89 96  Resp:  18 16 16   Temp:  98.4 F (36.9 C) 97.9 F (36.6 C) 98.4 F (36.9 C)  TempSrc:  Oral Oral Oral  SpO2: 92% 94% 96% 95%  Weight:  66.5 kg    Height:  5' (1.524 m)      Intake/Output Summary (Last 24 hours) at 07/01/2024 1435 Last data filed at 07/01/2024 0800 Gross per 24 hour  Intake 236 ml  Output 250 ml  Net -14 ml   Filed Weights   06/30/24 2230  Weight: 66.5 kg    Examination:  General exam: Appears calm and comfortable  Respiratory system: Bilateral decreased breath sounds at bases Cardiovascular system: S1 & S2 heard, Rate controlled Gastrointestinal system: Abdomen is nondistended, soft and nontender. Normal bowel sounds heard. Extremities: No cyanosis, clubbing, edema  Central nervous system: Alert and oriented. No focal neurological deficits. Moving extremities Skin: No rashes, lesions or ulcers Psychiatry: Judgement and insight appear normal. Mood & affect appropriate.     Data Reviewed: I have personally reviewed following labs and imaging studies  CBC: Recent Labs  Lab 06/30/24 1615 06/30/24 1630  WBC  --  8.6  NEUTROABS  --  5.5  HGB 11.9* 10.9*  HCT 35.0* 36.2  MCV  --  77.4*  PLT  --  333   Basic Metabolic Panel: Recent Labs  Lab 06/30/24 1615  NA 139  K 4.4  CL 105  GLUCOSE 117*  BUN 20  CREATININE 1.00   GFR: Estimated Creatinine Clearance: 47.1 mL/min (by C-G formula based on SCr of 1 mg/dL). Liver Function Tests: No results for input(s): AST, ALT, ALKPHOS, BILITOT, PROT, ALBUMIN in the last 168 hours. No results for input(s): LIPASE, AMYLASE in the last 168 hours. No results for input(s): AMMONIA in the last 168 hours. Coagulation  Profile: No results for input(s): INR, PROTIME in the last 168 hours. Cardiac Enzymes: No results for input(s): CKTOTAL, CKMB, CKMBINDEX, TROPONINI in the last 168 hours. BNP (last 3 results) No results for input(s): PROBNP in the last 8760 hours. HbA1C: No results for input(s): HGBA1C in the last 72 hours. CBG: No results for input(s): GLUCAP in the last 168 hours. Lipid Profile: No results for input(s): CHOL, HDL, LDLCALC, TRIG, CHOLHDL, LDLDIRECT in the last 72 hours. Thyroid  Function Tests: No results for input(s): TSH, T4TOTAL, FREET4, T3FREE, THYROIDAB in the last 72 hours. Anemia Panel: No results for input(s): VITAMINB12, FOLATE, FERRITIN, TIBC, IRON , RETICCTPCT in the last 72 hours. Sepsis Labs: No results for input(s): PROCALCITON, LATICACIDVEN in the last 168 hours.  No results found for this or any previous visit (from the past 240 hours).       Radiology Studies: CT Hip Left Wo Contrast Result Date: 06/30/2024 CLINICAL DATA:  Hip trauma, fracture suspected, xray done EXAM: CT OF THE LEFT HIP WITHOUT CONTRAST TECHNIQUE: Multidetector CT imaging of the left hip was performed according to the standard protocol. Multiplanar CT image reconstructions were also generated. RADIATION DOSE REDUCTION: This exam was performed according to the departmental dose-optimization program which includes automated exposure control, adjustment of the mA and/or kV according to patient size and/or use of iterative reconstruction technique. COMPARISON:  Radiograph earlier today FINDINGS: Bones/Joint/Cartilage Left hip arthroplasty in place. No arthroplasty dislocation. There is a comminuted mildly displaced fracture through the greater trochanter that extends to the lateral aspect of the femoral component. Pubic rami are intact. There is degenerative change of the pubic symphysis. No sacroiliac diastasis. Ligaments Suboptimally assessed by CT.  Muscles and Tendons No evidence of muscular hematoma. Soft tissues Mild soft tissue edema laterally without confluent hematoma. No acute intrapelvic abnormalities. Pelvic free fluid. IMPRESSION: Comminuted mildly displaced fracture through the greater trochanter that extends to the lateral aspect of the femoral component of left hip arthroplasty. Electronically Signed   By: Andrea Gasman M.D.   On: 06/30/2024 18:21   CT Head Wo Contrast Result Date: 06/30/2024 CLINICAL DATA:  Tripped and fell. Blunt trauma to head and neck. Pain. EXAM: CT HEAD WITHOUT CONTRAST CT CERVICAL SPINE WITHOUT CONTRAST TECHNIQUE: Multidetector CT imaging of the head and cervical spine was performed following the standard protocol without intravenous contrast. Multiplanar CT image reconstructions of the cervical spine were also generated. RADIATION DOSE REDUCTION: This exam was performed according to the departmental dose-optimization program which includes automated exposure control, adjustment of the mA and/or kV according to patient size and/or use of iterative reconstruction technique. COMPARISON:  Head CT on 08/25/2023, and cervical spine CT on 04/10/2021 FINDINGS: CT HEAD FINDINGS Brain: No evidence of intracranial hemorrhage, acute infarction, hydrocephalus, extra-axial collection, or mass lesion/mass effect. Mild chronic small vessel disease again noted. Vascular:  No hyperdense vessel or other acute findings. Skull: No evidence of fracture or other significant bone abnormality. Sinuses/Orbits:  No acute findings. Other: None. CT CERVICAL SPINE FINDINGS Alignment: Normal. Skull base and vertebrae: No acute fracture. No primary bone lesion or focal pathologic process. Soft tissues and spinal canal: No prevertebral fluid or  swelling. No visible canal hematoma. Disc levels: Atlantoaxial degenerative spurring noted. No disc space narrowing. Upper chest: No acute findings. Other: None. IMPRESSION: No acute intracranial abnormality.  Mild chronic small vessel disease. No evidence of acute cervical spine fracture or subluxation. Atlantoaxial degenerative changes noted. Electronically Signed   By: Norleen DELENA Kil M.D.   On: 06/30/2024 15:50   CT Cervical Spine Wo Contrast Result Date: 06/30/2024 CLINICAL DATA:  Tripped and fell. Blunt trauma to head and neck. Pain. EXAM: CT HEAD WITHOUT CONTRAST CT CERVICAL SPINE WITHOUT CONTRAST TECHNIQUE: Multidetector CT imaging of the head and cervical spine was performed following the standard protocol without intravenous contrast. Multiplanar CT image reconstructions of the cervical spine were also generated. RADIATION DOSE REDUCTION: This exam was performed according to the departmental dose-optimization program which includes automated exposure control, adjustment of the mA and/or kV according to patient size and/or use of iterative reconstruction technique. COMPARISON:  Head CT on 08/25/2023, and cervical spine CT on 04/10/2021 FINDINGS: CT HEAD FINDINGS Brain: No evidence of intracranial hemorrhage, acute infarction, hydrocephalus, extra-axial collection, or mass lesion/mass effect. Mild chronic small vessel disease again noted. Vascular:  No hyperdense vessel or other acute findings. Skull: No evidence of fracture or other significant bone abnormality. Sinuses/Orbits:  No acute findings. Other: None. CT CERVICAL SPINE FINDINGS Alignment: Normal. Skull base and vertebrae: No acute fracture. No primary bone lesion or focal pathologic process. Soft tissues and spinal canal: No prevertebral fluid or swelling. No visible canal hematoma. Disc levels: Atlantoaxial degenerative spurring noted. No disc space narrowing. Upper chest: No acute findings. Other: None. IMPRESSION: No acute intracranial abnormality. Mild chronic small vessel disease. No evidence of acute cervical spine fracture or subluxation. Atlantoaxial degenerative changes noted. Electronically Signed   By: Norleen DELENA Kil M.D.   On: 06/30/2024 15:50    DG Knee Complete 4 Views Left Result Date: 06/30/2024 CLINICAL DATA:  Pain after fall EXAM: LEFT KNEE - COMPLETE 4+ VIEW COMPARISON:  None Available. FINDINGS: Left knee total arthroplasty. No periprosthetic fracture or complications identified. No evidence of dislocation, or joint effusion. Degenerative changes of the patella. Soft tissues are unremarkable. IMPRESSION: Left knee total arthroplasty without evidence of radiographic complication. Electronically Signed   By: Megan  Zare M.D.   On: 06/30/2024 15:26   DG Hip Unilat W or Wo Pelvis 2-3 Views Left Result Date: 06/30/2024 CLINICAL DATA:  Fall.  LEFT hip pain EXAM: DG HIP (WITH OR WITHOUT PELVIS) 2-3V LEFT COMPARISON:  None Available. FINDINGS: LEFT total hip arthroplasty. No evidence of acute fracture or dislocation. IMPRESSION: 1. No evidence of acute fracture or dislocation. 2. LEFT total hip arthroplasty. Electronically Signed   By: Jackquline Boxer M.D.   On: 06/30/2024 15:22        Scheduled Meds:  amLODipine   2.5 mg Oral Daily   aspirin  EC  81 mg Oral Daily   budesonide  (PULMICORT ) nebulizer solution  0.25 mg Nebulization BID   busPIRone   10 mg Oral QHS   diltiazem   240 mg Oral Daily   DULoxetine   60 mg Oral Daily   enoxaparin  (LOVENOX ) injection  40 mg Subcutaneous Q24H   losartan   100 mg Oral Daily   pantoprazole   40 mg Oral Q0600   pravastatin   80 mg Oral Daily   primidone   125 mg Oral BID   Continuous Infusions:        Obaloluwa Delatte, MD [facility] 07/01/2024, 2:35 PM

## 2024-07-01 NOTE — Evaluation (Signed)
 Physical Therapy Evaluation Patient Details Name: Carrie [name] MRN: [id number] DOB: December 17, 1955 Today's Date: 07/01/2024  History of Present Illness  Carrie [name] is a 68 y.o. female who presented to [facility] ED 06/30/24 after mechanical fall, sustaining left periprosthetic greater trochanteric fracture. Plan for non-operative management. PMHx: CVA, HTN, HLD, T2DM, CKD 3A, moderate persistent asthma, anemia of chronic disease, psoriatic arthritis, osteoporosis, GERD, and mood disorder.   Clinical Impression  Pt admitted with above diagnosis. PTA, pt was independent with functional mobility, ADLs/IADLs, and driving. She lives with her son's family in a one story house with 3 STE. Pt currently with functional limitations due to the deficits listed below (see PT Problem List). She required modA for bed mobility and mod-maxA for transfers using RW. Pt was unable to take steps. PT physically lifted each LE in order to advance and facilitated hips to pivot pt to recliner chair. Pt will benefit from acute skilled PT to increase her independence and safety with mobility to allow discharge. Given her CLOF, limited support available, and high fall risk recommend continued inpatient follow up therapy, <3 hours/day.    If plan is discharge home, recommend the following: Two people to help with walking and/or transfers;Two people to help with bathing/dressing/bathroom;Assistance with cooking/housework;Assist for transportation;Help with stairs or ramp for entrance   Can travel by private vehicle   No    Equipment Recommendations Hospital bed;Hoyer lift;Wheelchair (measurements PT);Wheelchair cushion (measurements PT)  Recommendations for Other Services       Functional Status Assessment Patient has had a recent decline in their functional status and demonstrates the ability to make significant improvements in function in a reasonable and predictable amount of time.     Precautions / Restrictions  Precautions Precautions: Fall Recall of Precautions/Restrictions: Impaired Precaution/Restrictions Comments: Educated pt on weight-bearing restrictions; No active hip abduction. Fearful of falling Restrictions Weight Bearing Restrictions Per Provider Order: Yes RLE Weight Bearing Per Provider Order: Weight bearing as tolerated Other Position/Activity Restrictions: No active right hip abduction      Mobility  Bed Mobility Overal bed mobility: Needs Assistance Bed Mobility: Supine to Sit     Supine to sit: Mod assist, HOB elevated, Used rails     General bed mobility comments: Pt sat up on R side of bed with increased time. Cues for sequencing. Pt required assist to slowly bring BLE towards EOB. Assist via bed pad to pivot hips, elevate trunk, and scoot fwd til feet flat.    Transfers Overall transfer level: Needs assistance Equipment used: Rolling walker (2 wheels) Transfers: Sit to/from Stand, Bed to chair/wheelchair/BSC Sit to Stand: Mod assist   Step pivot transfers: Mod assist, Max assist       General transfer comment: Pt stood from lowest bed height. Cued proper hand placement using RW. Powered up with modA. Transferred to recliner chair on right position touching bed with drop arm down. Fair eccentric control.    Ambulation/Gait Ambulation/Gait assistance: Mod assist, Max assist Gait Distance (Feet): 2 Feet Assistive device: Rolling walker (2 wheels) Gait Pattern/deviations: Step-to pattern, Decreased step length - right, Decreased step length - left, Decreased stance time - left, Decreased weight shift to left, Antalgic       General Gait Details: Pt transferred to recliner chair on right. Cues for sequencing and to increase WBing through BUE support on RW. She required physical assist to advance BLEs. PT facilitated hips to turn and guided pt to chair.  Stairs  Wheelchair Mobility     Tilt Bed    Modified Rankin (Stroke Patients Only)        Balance Overall balance assessment: Needs assistance, History of Falls Sitting-balance support: Bilateral upper extremity supported, Feet supported Sitting balance-Leahy Scale: Fair Sitting balance - Comments: Pt sat EOB with CGA. She demonstrated a R lateral lean to offload L hip d/t pain. Able to achieve neutral posture with increased time. Postural control: Right lateral lean Standing balance support: Bilateral upper extremity supported, During functional activity, Reliant on assistive device for balance Standing balance-Leahy Scale: Poor Standing balance comment: Pt dependent on RW and mod-maxA.                             Pertinent Vitals/Pain Pain Assessment Pain Assessment: 0-10 Pain Score: 6  Pain Location: L hip Pain Descriptors / Indicators: Guarding, Grimacing, Discomfort, Moaning, Aching, Sore, Tender Pain Intervention(s): Premedicated before session, Monitored during session, Limited activity within patient's tolerance, Repositioned    Home Living Family/patient expects to be discharged to:: Private residence Living Arrangements: Children (Son, Daughter-in-Law, and Grandkids (1 and 4 y/o)) Available Help at Discharge: Family;Available PRN/intermittently (Daughter-in-Law is home most of the time; Son works) Type of Home: [employer] Access: Stairs to enter Entrance Stairs-Rails: Left;Right;Can reach both Secretary/administrator of Steps: 3   Home Layout: One level Home Equipment: Cane - single point;Rollator (4 wheels);Transport chair;Other (comment) (hurrycane) Additional Comments: Pt reports she is still recieving therapy for her shoulder, OPPT 2x/wk.    Prior Function Prior Level of Function : Independent/Modified Independent;History of Falls (last six months);Driving             Mobility Comments: Indep without AD. Several falls in the past 50mo, hx of injuring left hip and left shoulder. ADLs Comments: ModI with ADLs/IADLs. Retired.      Extremity/Trunk Assessment   Upper Extremity Assessment Upper Extremity Assessment: Defer to OT evaluation    Lower Extremity Assessment Lower Extremity Assessment: RLE deficits/detail RLE Deficits / Details: Pt with limited hip and knee AROM. She guarded against PROM attempts. Grossly 2/5 strength. RLE: Unable to fully assess due to pain RLE Coordination: decreased gross motor       Communication   Communication Communication: No apparent difficulties    Cognition Arousal: Alert Behavior During Therapy: WFL for tasks assessed/performed   PT - Cognitive impairments: No apparent impairments                       PT - Cognition Comments: Pt A,Ox4 Following commands: Intact       Cueing Cueing Techniques: Verbal cues, Gestural cues     General Comments      Exercises     Assessment/Plan    PT Assessment Patient needs continued PT services  PT Problem List Decreased strength;Decreased range of motion;Decreased activity tolerance;Decreased balance;Decreased mobility;Decreased knowledge of use of DME;Decreased safety awareness;Decreased knowledge of precautions;Pain       PT Treatment Interventions DME instruction;Gait training;Stair training;Functional mobility training;Therapeutic activities;Therapeutic exercise;Balance training;Patient/family education    PT Goals (Current goals can be found in the Care Plan section)  Acute Rehab PT Goals Patient Stated Goal: Have less pain and return Home PT Goal Formulation: With patient Time For Goal Achievement: 07/15/24 Potential to Achieve Goals: Fair    Frequency Min 2X/week     Co-evaluation               AM-PAC PT  6 Clicks Mobility  Outcome Measure Help needed turning from your back to your side while in a flat bed without using bedrails?: A Lot Help needed moving from lying on your back to sitting on the side of a flat bed without using bedrails?: A Lot Help needed moving to and from a bed  to a chair (including a wheelchair)?: A Lot Help needed standing up from a chair using your arms (e.g., wheelchair or bedside chair)?: A Lot Help needed to walk in hospital room?: Total Help needed climbing 3-5 steps with a railing? : Total 6 Click Score: 10    End of Session Equipment Utilized During Treatment: Gait belt Activity Tolerance: Patient limited by pain   Nurse Communication: Mobility status;Need for lift equipment (stedy) PT Visit Diagnosis: Other abnormalities of gait and mobility (R26.89);Pain;Difficulty in walking, not elsewhere classified (R26.2);Unsteadiness on feet (R26.81);History of falling (Z91.81) Pain - Right/Left: Left Pain - part of body: Hip    Time: 9054-8981 PT Time Calculation (min) (ACUTE ONLY): 33 min   Charges:   PT Evaluation $PT Eval Moderate Complexity: 1 Mod   PT General Charges $$ ACUTE PT VISIT: 1 Visit         Randall SAUNDERS, PT, DPT Acute Rehabilitation Services Office: [phone] Secure Chat Preferred  Delon CHRISTELLA Callander 07/01/2024, 12:10 PM

## 2024-07-01 NOTE — Progress Notes (Addendum)
 0300H patient complains of shoulder pain right side. The patient stated that she has a history of rotator cuff injury. Pain medication given and pain was relieved.   [phone] patient complains of pain on the same area. Pain medication given. After re assessment the pain is starting to get worse.    0630 notified Lynwood Kipper NP. Gave another dose of methocarbamol  and ice pack over the affected area.

## 2024-07-01 NOTE — Progress Notes (Signed)
 Transition of Care [facility]) - CAGE-AID Screening   Patient Details  Name: VESTA WHEELAND MRN: [id number] Date of Birth: November 01, 1955  [facility]) CM/SW Contact:    Bernardino Mayotte, RN Phone Number: 07/01/2024, 11:15 PM   Clinical Narrative:  Patient denies the use of alcohol and illicit substances. Resources not given at this time.  CAGE-AID Screening:    Have You Ever Felt You Ought to Cut Down on Your Drinking or Drug Use?: No Have People Annoyed You By Critizing Your Drinking Or Drug Use?: No Have You Felt Bad Or Guilty About Your Drinking Or Drug Use?: No Have You Ever Had a Drink or Used Drugs First Thing In The Morning to Steady Your Nerves or to Get Rid of a Hangover?: No CAGE-AID Score: 0  Substance Abuse Education Offered: No

## 2024-07-01 NOTE — TOC Initial Note (Signed)
 Transition of Care [facility]) - Initial/Assessment Note    Patient Details  Name: Carrie [name] MRN: [id number] Date of Birth: 1955-10-16  Transition of Care [facility]) CM/SW Contact:    Jeoffrey LITTIE Moose, [place] Phone Number: 07/01/2024, 9:40 AM  Clinical Narrative:                 Pt admitted from home due to mechanical fall. No current TOC needs, please consult as needs arise following therapy eval.        Patient Goals and CMS Choice            Expected Discharge Plan and Services                                              Prior Living Arrangements/Services                       Activities of Daily Living   ADL Screening (condition at time of admission) Independently performs ADLs?: Yes (appropriate for developmental age) Is the patient deaf or have difficulty hearing?: No Does the patient have difficulty seeing, even when wearing glasses/contacts?: No Does the patient have difficulty concentrating, remembering, or making decisions?: No  Permission Sought/Granted                  Emotional Assessment              Admission diagnosis:  Fracture of femur, trochanteric (HCC) [S72.109A] Closed displaced fracture of greater trochanter of left femur, initial encounter (HCC) [S72.112A] Fall, initial encounter [W19.XXXA] Patient Active Problem List   Diagnosis Date Noted   Fracture of femur, trochanteric (HCC) 06/30/2024   S/P reverse total shoulder arthroplasty, left 03/06/2024   Acute on chronic anemia 03/05/2024   OP (osteoporosis) 10/21/2023   Moderate persistent asthma 10/26/2022   Stage 3a chronic kidney disease (CKD) (HCC) 04/06/2022   Microcytic anemia 03/11/2022   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/16/2020   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/2020   Psoriatic arthritis (HCC) 07/08/2017   Mild neurocognitive disorder 10/18/2016   DM2 (diabetes mellitus, type 2) (HCC) 12/04/2013   Benign essential HTN  04/21/2013   Hyperlipidemia 01/09/2008   OBESITY 01/09/2008   GERD with esophageal stricture and stenosis s/p dilation  01/09/2008   HERNIA, VENTRAL 01/09/2008   Anxiety and depression 03/25/2007   PCP:  Jolee Madelin Patch, MD Pharmacy:   [facility] [phone] [address], [place] - [address] AT [facility] [address] [place] [address] Phone: [phone] Fax: [phone]  [facility] - [place], [place] - [address] [place] [address] Phone: [phone] Fax: [phone]     Social Drivers of Health (SDOH) Social History: SDOH Screenings   Food Insecurity: No Food Insecurity (06/30/2024)  Housing: Low Risk  (06/30/2024)  Transportation Needs: No Transportation Needs (06/30/2024)  Utilities: Not At Risk (06/30/2024)  Depression (PHQ2-9): Low Risk  (04/03/2024)  Social Connections: Unknown (06/30/2024)  Tobacco Use: Low Risk  (06/18/2024)   SDOH Interventions:     Readmission Risk Interventions    03/06/2024    2:48 PM  Readmission Risk Prevention Plan  Transportation Screening Complete  PCP or Specialist Appt within 5-7 Days Complete  Home Care Screening Complete  Medication Review (RN CM) Complete

## 2024-07-01 NOTE — H&P (Deleted)
 History and Physical    Carrie [name] FMW:[id number] DOB: May 12, 1956 DOA: 06/30/2024  PCP: Jolee Madelin Patch, MD   Patient coming from: Home   Chief Complaint:  Chief Complaint  Patient presents with   Fall    HPI:  Carrie [name] is a 68 y.o. female with hx of CVA, hypertension, hyperlipidemia, diabetes type 2, CKD 3A, moderate persistent asthma, anemia of chronic disease, psoriatic arthritis, osteoporosis, GERD, mood disorder, who presented after ground-level fall.   Found to have periprosthetic left hip fracture, orthopedic have recommended nonsurgical management.    Review of Systems:  ROS complete and negative except as marked above   Allergies  Allergen Reactions   Lisinopril Cough    Prior to Admission medications   Medication Sig Start Date End Date Taking? Authorizing Provider  Accu-Chek Softclix Lancets lancets Use as instructed to check blood sugar 1-2 times daily Dx E11.9 10/23/23   Trixie File, MD  acetaminophen  (TYLENOL ) 500 MG tablet Take 1,000 mg by mouth every 6 (six) hours as needed for moderate pain (pain score 4-6).    [provider]  albuterol  (VENTOLIN  HFA) 108 (90 Base) MCG/ACT inhaler Inhale 2 puffs into the lungs every 4 (four) hours as needed for wheezing or shortness of breath (or coughing).    [provider]  amLODipine  (NORVASC ) 2.5 MG tablet Take 2.5 mg by mouth daily. 09/03/22   [provider]  aspirin  EC 81 MG tablet Take 1 tablet (81 mg total) by mouth 2 (two) times daily. Swallow whole. 03/06/24   [place], Hospital doctor, PA-C  Blood Glucose Monitoring Suppl (ACCU-CHEK GUIDE ME) w/Device KIT USE AS INSTRUCTED TO CHECK BLOOD SUGAR 2 TIMES DAILY DX E11.9 12/25/23   Trixie File, MD  busPIRone  (BUSPAR ) 5 MG tablet Take 10 mg by mouth at bedtime.    [provider]  Calcium  Carb-Cholecalciferol (CALCIUM  CARBONATE-VITAMIN D3 PO) Take 1 tablet by mouth daily.    [provider]   clotrimazole -betamethasone (LOTRISONE) cream Apply 1 Application topically 2 (two) times daily as needed (rash). 02/24/24   [provider]  Continuous Glucose Sensor (FREESTYLE LIBRE 3 PLUS SENSOR) MISC 1 each by Does not apply route every 14 (fourteen) days. 06/08/24   Trixie File, MD  denosumab  (PROLIA ) 60 MG/ML SOSY injection Inject 60 mg into the skin every 6 (six) months.    [provider]  diltiazem  (CARDIZEM  CD) 240 MG 24 hr capsule Take 240 mg by mouth daily.    [provider]  DULoxetine  (CYMBALTA ) 60 MG capsule Take 60 mg by mouth daily. 09/28/22   [provider]  ferrous sulfate  325 (65 FE) MG EC tablet Take 325 mg by mouth daily.    [provider]  glucose blood (ACCU-CHEK GUIDE TEST) test strip USE 1 STRIP 2 (TWO) TIMES DAILY. 03/25/24   Trixie File, MD  losartan  (COZAAR ) 100 MG tablet TAKE 1 TABLET BY MOUTH EVERY DAY IN THE MORNING 02/03/23   Trixie File, MD  Melatonin 12 MG TABS Take 12 mg by mouth at bedtime as needed (sleep).    [provider]  metFORMIN  (GLUCOPHAGE -XR) 500 MG 24 hr tablet Take 1 tablet (500 mg total) by mouth at bedtime. Patient taking differently: Take 500 mg by mouth 2 (two) times daily. 08/15/23   Motwani, Komal, MD  mirabegron  ER (MYRBETRIQ ) 25 MG TB24 tablet Take 25 mg by mouth daily.    [provider]  omeprazole (PRILOSEC) 40 MG capsule Take 40 mg by mouth 2 (  two) times daily. 02/05/24 08/03/24  [provider]  pravastatin  (PRAVACHOL ) 80 MG tablet Take 80 mg by mouth daily.    [provider]  primidone  (MYSOLINE ) 250 MG tablet Take 125 mg by mouth 2 (two) times daily. 12/03/22   [provider]  QVAR REDIHALER 80 MCG/ACT inhaler Inhale 1 puff into the lungs 2 (two) times daily.    [provider]  Semaglutide , 1 MG/DOSE, (OZEMPIC , 1 MG/DOSE,) 4 MG/3ML SOPN Inject 1 mg into the skin once a week. 02/20/24   Trixie File, MD  STELARA 45  MG/0.5ML injection Inject 45 mg into the skin every 3 (three) months. 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   Liver cirrhosis (HCC)    Nausea and vomiting 01/06/2013   Neuropathy    Bil feet re: to Diabetes   Obesity    Osteoporosis    Plantar fasciitis    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   Stricture and stenosis of esophagus    Stroke (HCC)    TIA (transient ischemic attack)    8-10 yrs ago, no problems since   Tuberculosis    tested positive 2011, no symptoms, was on medicine for 6 months    Past Surgical History:  Procedure Laterality Date   BREAST SURGERY Bilateral    biopsies both breast   CARDIAC CATHETERIZATION  08/30/2010   CESAREAN SECTION     x 2    CHOLECYSTECTOMY     COLONOSCOPY     FOOT SURGERY Left    spur removal   HERNIA REPAIR     umbilical hernia repaired at the same time as cholecystectomy   IR FLUORO GUIDE CV LINE RIGHT  04/12/2021   IR US  GUIDE VASC ACCESS RIGHT  04/12/2021   JOINT REPLACEMENT     bilat knee    KNEE SURGERY Bilateral    x 2 joint knee replacements   KYPHOPLASTY N/A 03/28/2022   Procedure: LUMBAR ONE KYPHOPLASTY;  Surgeon: Beuford Anes, MD;  Location: [facility] OR;  Service: Orthopedics;  Laterality: N/A;  LUMBAR ONE KYPHOPLASTY   REVERSE SHOULDER ARTHROPLASTY Left 03/06/2024   Procedure: ARTHROPLASTY, SHOULDER, TOTAL, REVERSE;  Surgeon: Dozier Soulier, MD;  Location: [facility];  Service: Orthopedics;  Laterality: Left;   SHOULDER ARTHROSCOPY Right 12/18/2022   Procedure:  RIGHT SHOULDER ARTHROSCOPY, ACROMIOPLASTY AND DEBRIDEMENT;  Surgeon: Sheril Coy, MD;  Location: [facility];  Service: Orthopedics;  Laterality: Right;   TOTAL HIP ARTHROPLASTY Left 10/26/2022   Procedure: TOTAL HIP ARTHROPLASTY ANTERIOR APPROACH;  Surgeon: Yvone Rush, MD;  Location: [facility];  Service: Orthopedics;  Laterality: Left;   TOTAL KNEE REVISION Left 10/03/2021   Procedure: LEFT TOTAL KNEE REVISION POLY SWAP WITH IRRIGATION AND DEBRIDEMENT;  Surgeon: Sheril Coy, MD;  Location: [facility];  Service: Orthopedics;  Laterality: Left;   UPPER GASTROINTESTINAL ENDOSCOPY     WISDOM  TOOTH EXTRACTION       reports that she has never smoked. She has never been exposed to tobacco smoke. She has never used smokeless tobacco. She reports that she does not currently use alcohol. She reports that she does not use drugs.  Family History  Problem Relation Age of Onset   Diabetes Other        maternal aunts and uncles   Diabetes Father    Liver disease Father    Emphysema Father        smoked   Diabetes Sister    Diabetes Brother    Sarcoidosis Brother    Emphysema Mother        smoked   Asthma Mother    Asthma Brother    Colon cancer Neg Hx    Esophageal cancer Neg Hx    Stomach cancer Neg Hx    Rectal cancer Neg Hx      Physical Exam: Vitals:   06/30/24 2100 06/30/24 2230 07/01/24 0409 07/01/24 0747  BP: 132/74 (!) 163/72 (!) 143/70 134/64  Pulse: 95 96 89 96  Resp:  18 16 16   Temp:  98.4 F (36.9 C) 97.9 F (36.6 C) 98.4 F (36.9 C)  TempSrc:  Oral Oral Oral  SpO2: 92% 94% 96% 95%  Weight:  66.5 kg    Height:  5' (1.524 m)      Gen: Awake, alert, NAD  HEENT: There is a hematoma along the left parietal scalp with an abrasion over the area CV: Regular, normal S1, S2, no murmurs  Resp: Normal WOB, CTAB  Abd: Flat, normoactive, nontender MSK: Symmetric, no edema.  Mild bruising along the left lateral hip, painful range of motion Skin: See HEENT.  No rashes or lesions  to exposed skin  Neuro: Alert and interactive  Psych: euthymic, appropriate    Data review:   Labs reviewed, notable for:   Chemistries unremarkable Hemoglobin 10.9, microcytic   Micro:  Results for orders placed or performed during the hospital encounter of 03/05/24  Surgical pcr screen     Status: Abnormal   Collection Time: 03/05/24  3:04 PM   Specimen: Nasal Mucosa; Nasal Swab  Result Value Ref Range Status   MRSA, PCR NEGATIVE NEGATIVE Final   Staphylococcus aureus POSITIVE (A) NEGATIVE Final    Comment: (NOTE) The Xpert SA Assay (FDA approved for NASAL specimens in patients 34 years of age and older), is one component of a comprehensive surveillance program. It is not intended to diagnose infection nor to guide or monitor treatment. Performed at [facility], [address], [place], [place] [address]     Imaging reviewed:  CT Hip Left Wo Contrast Result Date: 06/30/2024 CLINICAL DATA:  Hip trauma, fracture suspected, xray done EXAM: CT OF THE LEFT HIP WITHOUT CONTRAST TECHNIQUE: Multidetector CT imaging of the left hip was performed according to the standard protocol. Multiplanar CT image reconstructions were also generated. RADIATION DOSE REDUCTION: This exam was performed according to the departmental dose-optimization program which includes automated exposure control, adjustment of the mA and/or kV according to patient size and/or use of iterative reconstruction technique. COMPARISON:  Radiograph earlier today FINDINGS: Bones/Joint/Cartilage Left hip arthroplasty in place. No arthroplasty dislocation. There is a comminuted mildly displaced fracture through the greater trochanter that extends to the lateral aspect of the femoral component. Pubic rami are intact. There is degenerative change of the pubic symphysis. No sacroiliac diastasis. Ligaments Suboptimally assessed by CT. Muscles and Tendons No evidence of  muscular hematoma. Soft tissues Mild soft  tissue edema laterally without confluent hematoma. No acute intrapelvic abnormalities. Pelvic free fluid. IMPRESSION: Comminuted mildly displaced fracture through the greater trochanter that extends to the lateral aspect of the femoral component of left hip arthroplasty. Electronically Signed   By: Andrea Gasman M.D.   On: 06/30/2024 18:21   CT Head Wo Contrast Result Date: 06/30/2024 CLINICAL DATA:  Tripped and fell. Blunt trauma to head and neck. Pain. EXAM: CT HEAD WITHOUT CONTRAST CT CERVICAL SPINE WITHOUT CONTRAST TECHNIQUE: Multidetector CT imaging of the head and cervical spine was performed following the standard protocol without intravenous contrast. Multiplanar CT image reconstructions of the cervical spine were also generated. RADIATION DOSE REDUCTION: This exam was performed according to the departmental dose-optimization program which includes automated exposure control, adjustment of the mA and/or kV according to patient size and/or use of iterative reconstruction technique. COMPARISON:  Head CT on 08/25/2023, and cervical spine CT on 04/10/2021 FINDINGS: CT HEAD FINDINGS Brain: No evidence of intracranial hemorrhage, acute infarction, hydrocephalus, extra-axial collection, or mass lesion/mass effect. Mild chronic small vessel disease again noted. Vascular:  No hyperdense vessel or other acute findings. Skull: No evidence of fracture or other significant bone abnormality. Sinuses/Orbits:  No acute findings. Other: None. CT CERVICAL SPINE FINDINGS Alignment: Normal. Skull base and vertebrae: No acute fracture. No primary bone lesion or focal pathologic process. Soft tissues and spinal canal: No prevertebral fluid or swelling. No visible canal hematoma. Disc levels: Atlantoaxial degenerative spurring noted. No disc space narrowing. Upper chest: No acute findings. Other: None. IMPRESSION: No acute intracranial abnormality. Mild chronic small vessel disease. No evidence of acute cervical spine  fracture or subluxation. Atlantoaxial degenerative changes noted. Electronically Signed   By: Norleen DELENA Kil M.D.   On: 06/30/2024 15:50   CT Cervical Spine Wo Contrast Result Date: 06/30/2024 CLINICAL DATA:  Tripped and fell. Blunt trauma to head and neck. Pain. EXAM: CT HEAD WITHOUT CONTRAST CT CERVICAL SPINE WITHOUT CONTRAST TECHNIQUE: Multidetector CT imaging of the head and cervical spine was performed following the standard protocol without intravenous contrast. Multiplanar CT image reconstructions of the cervical spine were also generated. RADIATION DOSE REDUCTION: This exam was performed according to the departmental dose-optimization program which includes automated exposure control, adjustment of the mA and/or kV according to patient size and/or use of iterative reconstruction technique. COMPARISON:  Head CT on 08/25/2023, and cervical spine CT on 04/10/2021 FINDINGS: CT HEAD FINDINGS Brain: No evidence of intracranial hemorrhage, acute infarction, hydrocephalus, extra-axial collection, or mass lesion/mass effect. Mild chronic small vessel disease again noted. Vascular:  No hyperdense vessel or other acute findings. Skull: No evidence of fracture or other significant bone abnormality. Sinuses/Orbits:  No acute findings. Other: None. CT CERVICAL SPINE FINDINGS Alignment: Normal. Skull base and vertebrae: No acute fracture. No primary bone lesion or focal pathologic process. Soft tissues and spinal canal: No prevertebral fluid or swelling. No visible canal hematoma. Disc levels: Atlantoaxial degenerative spurring noted. No disc space narrowing. Upper chest: No acute findings. Other: None. IMPRESSION: No acute intracranial abnormality. Mild chronic small vessel disease. No evidence of acute cervical spine fracture or subluxation. Atlantoaxial degenerative changes noted. Electronically Signed   By: Norleen DELENA Kil M.D.   On: 06/30/2024 15:50   DG Knee Complete 4 Views Left Result Date: 06/30/2024 CLINICAL  DATA:  Pain after fall EXAM: LEFT KNEE - COMPLETE 4+ VIEW COMPARISON:  None Available. FINDINGS: Left knee total arthroplasty. No periprosthetic fracture or complications identified. No evidence of  dislocation, or joint effusion. Degenerative changes of the patella. Soft tissues are unremarkable. IMPRESSION: Left knee total arthroplasty without evidence of radiographic complication. Electronically Signed   By: Megan  Zare M.D.   On: 06/30/2024 15:26   DG Hip Unilat W or Wo Pelvis 2-3 Views Left Result Date: 06/30/2024 CLINICAL DATA:  Fall.  LEFT hip pain EXAM: DG HIP (WITH OR WITHOUT PELVIS) 2-3V LEFT COMPARISON:  None Available. FINDINGS: LEFT total hip arthroplasty. No evidence of acute fracture or dislocation. IMPRESSION: 1. No evidence of acute fracture or dislocation. 2. LEFT total hip arthroplasty. Electronically Signed   By: Jackquline Boxer M.D.   On: 06/30/2024 15:22    EKG:  Personally reviewed, sinus rhythm, borderline LVH no acute ischemic changes  ED Course:  Treated with morphine , fentanyl , Zofran . EDP consulted with orthopedic surgery Dr. Reyne who has seen the patient, recommends for weightbearing as tolerated, physical therapy evaluation, and follow-up with surgeon Dr. Daldorff in 2 weeks.    Assessment/Plan:  68 y.o. female with hx anemia of chronic disease, hypertension, type 2 diabetes, history of CVA, stage IIIa CKD, psoriatic arthritis, osteoporosis, moderate persistent asthma, hyperlipidemia, GERD, anxiety and depression, who presented after a ground-level fall complicated by a left periprostatic greater trochanteric fracture  Ground-level fall Left periprosthetic greater trochanteric fracture - Dr. Reyne who has seen the patient, recommends for weightbearing as tolerated no active abduction of the L hip, physical therapy evaluation, and follow-up with surgeon Dr. Daldorff in 2 weeks.  -PT/OT evaluation, WBAT --> SNF recommended -Check orthostatics - Pain control:  Tylenol  as needed mild, methocarbamol  as needed spasm, oxycodone  2.5/5 mg every 4 hours as needed for moderate/severe, Dilaudid  0.5 mg IV Q 4 hr prn for breakthrough.  Chronic medical problems: CVA: continue home Aspirin , pravastatin  Hypertension: Continue home amlodipine , losartan , diltiazem  Hyperlipidemia: See CVA diabetes type 2: On metformin , Ozempic  outpatient.  Not currently requiring SSI CKD 3A: At baseline creatinine near 1 moderate persistent asthma: Continue home beclomethasone equivalent, nebs as needed anemia of chronic disease: Hemoglobin stable/actually up from baseline at 10.9.  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 On skyrizi outpatient.  Osteoporosis: Noted, check vitamin D  can follow-up for additional treatment outpatient GERD: Sub PPI mood disorder: Continue home duloxetine , buspirone  ? RLS: Continue home primidone     Body mass index is 28.62 kg/m.    DVT prophylaxis:  Lovenox  Code Status:  Full Code Diet:  Diet Orders (From admission, onward)     Start     Ordered   06/30/24 2039  Diet Carb Modified Fluid consistency: Thin; Room service appropriate? Yes  Diet effective now       Question Answer Comment  Diet-HS Snack? Nothing   Calorie Level Medium 1600-2000   Fluid consistency: Thin   Room service appropriate? Yes      06/30/24 2039           Family Communication:  None   Consults:  Orthopedics   Admission status:   Observation, Med-Surg  Severity of Illness: The appropriate patient status for this patient is OBSERVATION. Observation status is judged to be reasonable and necessary in order to provide the required intensity of service to ensure the patient's safety. The patient's presenting symptoms, physical exam findings, and initial radiographic and laboratory data in the context of their medical condition is felt to place them at decreased risk for further clinical deterioration. Furthermore, it is anticipated  that the patient will be medically stable for discharge from the hospital within 2  midnights of admission.    Derryl Duval, MD [facility]  How to contact the [facility] Attending or Consulting provider 7A - 7P or covering provider during after hours 7P -7A, for this patient.  Check the care team in [facility] and look for a) attending/consulting [facility] provider listed and b) the [facility] team listed Log into [email or web address] and use [facility]'s universal password to access. If you do not have the password, please contact the hospital operator. Locate the [facility] provider you are looking for under [facility] and page to a number that you can be directly reached. If you still have difficulty reaching the provider, please page the [facility] (Director on Call) for the Hospitalists listed on amion for assistance.  07/01/2024, 2:32 PM

## 2024-07-02 DIAGNOSIS — S72112A Displaced fracture of greater trochanter of left femur, initial encounter for closed fracture: Secondary | ICD-10-CM | POA: Diagnosis not present

## 2024-07-02 DIAGNOSIS — S72102A Unspecified trochanteric fracture of left femur, initial encounter for closed fracture: Secondary | ICD-10-CM | POA: Diagnosis not present

## 2024-07-02 LAB — CBC
HCT: 29.5 % — ABNORMAL LOW (ref 36.0–46.0)
Hemoglobin: 8.9 g/dL — ABNORMAL LOW (ref 12.0–15.0)
MCH: 23.2 pg — ABNORMAL LOW (ref 26.0–34.0)
MCHC: 30.2 g/dL (ref 30.0–36.0)
MCV: 77 fL — ABNORMAL LOW (ref 80.0–100.0)
Platelets: 330 K/uL (ref 150–400)
RBC: 3.83 MIL/uL — ABNORMAL LOW (ref 3.87–5.11)
RDW: 19.2 % — ABNORMAL HIGH (ref 11.5–15.5)
WBC: 7.8 K/uL (ref 4.0–10.5)
nRBC: 0 % (ref 0.0–0.2)

## 2024-07-02 LAB — FOLATE: Folate: 7.6 ng/mL (ref >5.9)

## 2024-07-02 LAB — RETICULOCYTES
Immature Retic Fract: 24.3 % — ABNORMAL HIGH (ref 2.3–15.9)
RBC.: 3.99 MIL/uL (ref 3.87–5.11)
Retic Count, Absolute: 56.3 K/uL (ref 19.0–186.0)
Retic Ct Pct: 1.4 % (ref 0.4–3.1)

## 2024-07-02 LAB — RENAL FUNCTION PANEL
Albumin: 2.9 g/dL — ABNORMAL LOW (ref 3.5–5.0)
Anion gap: 11 (ref 5–15)
BUN: 16 mg/dL (ref 8–23)
CO2: 25 mmol/L (ref 22–32)
Calcium: 8.9 mg/dL (ref 8.9–10.3)
Chloride: 98 mmol/L (ref 98–111)
Creatinine, Ser: 1.1 mg/dL — ABNORMAL HIGH (ref 0.44–1.00)
GFR, Estimated: 55 mL/min — ABNORMAL LOW (ref >60)
Glucose, Bld: 171 mg/dL — ABNORMAL HIGH (ref 70–99)
Phosphorus: 3.3 mg/dL (ref 2.5–4.6)
Potassium: 4.1 mmol/L (ref 3.5–5.1)
Sodium: 134 mmol/L — ABNORMAL LOW (ref 135–145)

## 2024-07-02 LAB — IRON AND TIBC
Iron: 19 ug/dL — ABNORMAL LOW (ref 28–170)
Saturation Ratios: 7 % — ABNORMAL LOW (ref 10.4–31.8)
TIBC: 265 ug/dL (ref 250–450)
UIBC: 246 ug/dL

## 2024-07-02 LAB — TSH: TSH: 1.493 u[IU]/mL (ref 0.350–4.500)

## 2024-07-02 LAB — VITAMIN B12: Vitamin B-12: 201 pg/mL (ref 180–914)

## 2024-07-02 LAB — VITAMIN D 25 HYDROXY (VIT D DEFICIENCY, FRACTURES): Vit D, 25-Hydroxy: 18 ng/mL — ABNORMAL LOW (ref 30–100)

## 2024-07-02 LAB — MAGNESIUM: Magnesium: 1.8 mg/dL (ref 1.7–2.4)

## 2024-07-02 LAB — FERRITIN: Ferritin: 36 ng/mL (ref 11–307)

## 2024-07-02 MED ORDER — METFORMIN HCL 500 MG PO TABS: 500.0000 mg | ORAL_TABLET | Freq: Two times a day (BID) | ORAL | Status: DC

## 2024-07-02 MED ORDER — POLYETHYLENE GLYCOL 3350 17 G PO PACK: 17.0000 g | PACK | Freq: Two times a day (BID) | ORAL | Status: DC | PRN

## 2024-07-02 MED ORDER — VITAMIN D (ERGOCALCIFEROL) 1.25 MG (50000 UNIT) PO CAPS
50000.0000 [IU] | ORAL_CAPSULE | ORAL | Status: DC
Start: 2024-07-02 — End: 2024-07-06
  Administered 2024-07-02: 50000 [IU] via ORAL
  Filled 2024-07-02: qty 1

## 2024-07-02 MED ORDER — VITAMIN B-12 1000 MCG PO TABS
1000.0000 ug | ORAL_TABLET | Freq: Every day | ORAL | Status: DC
  Administered 2024-07-03 – 2024-07-06 (×4): 1000 ug via ORAL
  Filled 2024-07-02 (×4): qty 1

## 2024-07-02 MED ORDER — METFORMIN HCL ER 500 MG PO TB24
500.0000 mg | ORAL_TABLET | Freq: Two times a day (BID) | ORAL | Status: DC
  Administered 2024-07-02 – 2024-07-06 (×8): 500 mg via ORAL
  Filled 2024-07-02 (×9): qty 1

## 2024-07-02 MED ORDER — CYANOCOBALAMIN 1000 MCG/ML IJ SOLN
1000.0000 ug | Freq: Once | INTRAMUSCULAR | Status: AC
  Administered 2024-07-02: 1000 ug via INTRAMUSCULAR
  Filled 2024-07-02 (×2): qty 1

## 2024-07-02 MED ORDER — METHOCARBAMOL 500 MG PO TABS: 500.0000 mg | ORAL_TABLET | Freq: Three times a day (TID) | ORAL | 0 refills | Status: AC | PRN

## 2024-07-02 MED ORDER — OXYCODONE HCL 5 MG PO TABS: 5.0000 mg | ORAL_TABLET | Freq: Four times a day (QID) | ORAL | 0 refills | Status: AC | PRN

## 2024-07-02 MED ORDER — SENNOSIDES-DOCUSATE SODIUM 8.6-50 MG PO TABS: 1.0000 | ORAL_TABLET | Freq: Two times a day (BID) | ORAL | Status: DC | PRN

## 2024-07-02 MED ORDER — SENNOSIDES-DOCUSATE SODIUM 8.6-50 MG PO TABS
1.0000 | ORAL_TABLET | Freq: Every day | ORAL | Status: DC
  Administered 2024-07-02: 1 via ORAL
  Filled 2024-07-02: qty 1

## 2024-07-02 MED ORDER — MIRABEGRON ER 25 MG PO TB24
25.0000 mg | ORAL_TABLET | Freq: Every day | ORAL | Status: DC
  Administered 2024-07-02 – 2024-07-06 (×5): 25 mg via ORAL
  Filled 2024-07-02 (×5): qty 1

## 2024-07-02 MED ORDER — POLYETHYLENE GLYCOL 3350 17 GM/SCOOP PO POWD: 17.0000 g | Freq: Two times a day (BID) | ORAL | Status: AC | PRN

## 2024-07-02 MED ORDER — ACETAMINOPHEN 325 MG PO TABS
650.0000 mg | ORAL_TABLET | Freq: Four times a day (QID) | ORAL | Status: DC
  Administered 2024-07-02 – 2024-07-06 (×13): 650 mg via ORAL
  Filled 2024-07-02 (×13): qty 2

## 2024-07-02 MED ORDER — ACETAMINOPHEN 325 MG PO TABS: 650.0000 mg | ORAL_TABLET | Freq: Four times a day (QID) | ORAL | Status: AC

## 2024-07-02 MED ORDER — FERROUS SULFATE 325 (65 FE) MG PO TABS
325.0000 mg | ORAL_TABLET | Freq: Every day | ORAL | Status: DC
  Administered 2024-07-02 – 2024-07-06 (×5): 325 mg via ORAL
  Filled 2024-07-02 (×5): qty 1

## 2024-07-02 NOTE — TOC CM/SW Note (Signed)
 RE: Carrie [name]. Carrie [name] Date of Birth: [birth year] Date: 07/02/2024  Please be advised that the above-named patient will require a short-term nursing home stay - anticipated 30 days or less for rehabilitation and strengthening.  The plan is for return home.

## 2024-07-02 NOTE — Progress Notes (Signed)
 PROGRESS NOTE  Carrie [name] FMW:[id number] DOB: Sep 19, 1956   PCP: Jolee Madelin Patch, MD  Patient is from: Home  DOA: 06/30/2024 LOS: 0  Chief complaints Chief Complaint  Patient presents with   Fall     Brief Narrative / Interim history: 68 year old with PMH of CVA, EtOH cirrhosis, mild neurocognitive disorder, DM-2, CKD-3A, osteoporosis, anemia of chronic disease, asthma, HTN, HLD, mood disorder and GERD brought to ED after she had a ground-level fall after she lost her balance when she tried to brush away a monster truck toy off the last step on her stair.  She hit her left side of her head and her left hip and had difficulty with mobilization due to severity of her pain.  No prodrome leading to fall.  In ED, stable vitals.  Labs without significant finding.  Left hip/pelvic/knee x-ray without acute finding.  CT head and cervical spine without acute finding.  Left hip CT showed comminuted mildly displaced fracture through the greater trochanter that extends to the lateral aspect of femoral component of left hip arthroplasty.  Orthopedic surgery consulted and recommended nonoperative management with WBAT with no active left hip abduction.  Patient admitted for pain control and therapy evaluation.   Therapy recommended SNF.  Subjective: Seen and examined earlier this morning.  No major events overnight or this morning.  Reports some pain in her left hip.  No complaint this morning.  Objective: Vitals:   07/01/24 1956 07/01/24 2000 07/02/24 0400 07/02/24 0812  BP:  138/61 133/63 (!) 149/64  Pulse:  99 (!) 101 93  Resp:  17 18 18   Temp:  98.3 F (36.8 C) 98.1 F (36.7 C) 98.7 F (37.1 C)  TempSrc:  Oral Oral Oral  SpO2: 94% 95% 94% 94%  Weight:      Height:        Examination:  GENERAL: No apparent distress.  Nontoxic. HEENT: MMM.  Vision and hearing grossly intact.  NECK: Supple.  No apparent JVD.  RESP:  No IWOB.  Fair aeration bilaterally. CVS:  RRR. Heart sounds  normal.  ABD/GI/GU: BS+. Abd soft, NTND.  MSK/EXT:  Moves extremities but limited in left leg due to pain. No apparent deformity. No edema.  Tenderness over left hip.  No bruise or hematoma. SKIN: no apparent skin lesion or wound NEURO: AA.  Oriented appropriately.  No apparent focal neuro deficit. PSYCH: Calm. Normal affect.   Consultants:  Orthopedic surgery  Procedures: None  Microbiology summarized: None  Assessment and plan: Ground-level fall-mechanical fall. Left periprosthetic greater trochanteric fracture due to fall and osteoporosis -Ortho, Dr. Reyne recommended WBAT and  no active abduction of the L hip -Pain control.  Scheduled Tylenol  with as needed Robaxin , oxycodone  and Dilaudid . -Check vitamin D  level and TSH. -Scheduled Senokot with as needed MiraLAX  -Outpatient follow-up with orthopedic surgery after discharge  Osteoporosis: Seems to be on Prolia  and calcium  supplementation.   -Check vitamin D  and TSH. - continue calcium  level   Chronic medical problems: CVA hyperlipidemia: continue home Aspirin , pravastatin  Hypertension: Continue home  losartan , diltiazem .  Discontinue amlodipine . NIDDM-2: A1c 7.0% on 8/25.  On metformin , Ozempic  outpatient.  Continue home metformin . CKD-3A: Stable. Moderate persistent asthma: Continue home beclomethasone equivalent, nebs as needed Anemia of chronic disease: Microcytic.  Stable.  Check anemia panel.  Continue ferrous sulfate . Psoriatic arthritis: On skyri[facility] outpatient.  GERD: Sub PPI Anxiety and depression: Continue home duloxetine , buspirone  Tremor?: Continue home primidone  Overactive bladder: Continue home Myrbetriq  Body mass index is 28.62  kg/m.           DVT prophylaxis:  enoxaparin  (LOVENOX ) injection 40 mg Start: 07/01/24 1000  Code Status: Full code Family Communication: None at bedside Level of care: Med-Surg Status is: Observation The patient will require care spanning > 2 midnights and should be  moved to inpatient because: Left hip fracture,   Final disposition: SNF   35 minutes with more than 50% spent in reviewing records, counseling patient/family and coordinating care.   Sch Meds:  Scheduled Meds:  acetaminophen   650 mg Oral Q6H WA   amLODipine   2.5 mg Oral Daily   aspirin  EC  81 mg Oral Daily   busPIRone   10 mg Oral QHS   clotrimazole    Topical BID   diltiazem   240 mg Oral Daily   DULoxetine   60 mg Oral Daily   enoxaparin  (LOVENOX ) injection  40 mg Subcutaneous Q24H   losartan   100 mg Oral Daily   pantoprazole   40 mg Oral Q0600   pravastatin   80 mg Oral Daily   primidone   125 mg Oral BID   Continuous Infusions: PRN Meds:.albuterol , camphor-menthol , HYDROmorphone  (DILAUDID ) injection, loratadine , melatonin, methocarbamol , ondansetron  (ZOFRAN ) IV, oxyCODONE  **OR** oxyCODONE , polyethylene glycol  Antimicrobials: Anti-infectives (From admission, onward)    None        I have personally reviewed the following labs and images: CBC: Recent Labs  Lab 06/30/24 1615 06/30/24 1630  WBC  --  8.6  NEUTROABS  --  5.5  HGB 11.9* 10.9*  HCT 35.0* 36.2  MCV  --  77.4*  PLT  --  333   BMP &GFR Recent Labs  Lab 06/30/24 1615  NA 139  K 4.4  CL 105  GLUCOSE 117*  BUN 20  CREATININE 1.00   Estimated Creatinine Clearance: 47.1 mL/min (by C-G formula based on SCr of 1 mg/dL). Liver & Pancreas: No results for input(s): AST, ALT, ALKPHOS, BILITOT, PROT, ALBUMIN in the last 168 hours. No results for input(s): LIPASE, AMYLASE in the last 168 hours. No results for input(s): AMMONIA in the last 168 hours. Diabetic: No results for input(s): HGBA1C in the last 72 hours. No results for input(s): GLUCAP in the last 168 hours. Cardiac Enzymes: No results for input(s): CKTOTAL, CKMB, CKMBINDEX, TROPONINI in the last 168 hours. No results for input(s): PROBNP in the last 8760 hours. Coagulation Profile: No results for input(s): INR,  PROTIME in the last 168 hours. Thyroid  Function Tests: No results for input(s): TSH, T4TOTAL, FREET4, T3FREE, THYROIDAB in the last 72 hours. Lipid Profile: No results for input(s): CHOL, HDL, LDLCALC, TRIG, CHOLHDL, LDLDIRECT in the last 72 hours. Anemia Panel: No results for input(s): VITAMINB12, FOLATE, FERRITIN, TIBC, IRON , RETICCTPCT in the last 72 hours. Urine analysis:    Component Value Date/Time   COLORURINE STRAW (A) 03/07/2024 1239   APPEARANCEUR CLEAR 03/07/2024 1239   LABSPEC 1.008 03/07/2024 1239   PHURINE 6.0 03/07/2024 1239   GLUCOSEU NEGATIVE 03/07/2024 1239   HGBUR NEGATIVE 03/07/2024 1239   BILIRUBINUR NEGATIVE 03/07/2024 1239   KETONESUR NEGATIVE 03/07/2024 1239   PROTEINUR NEGATIVE 03/07/2024 1239   UROBILINOGEN 0.2 06/25/2015 1600   NITRITE NEGATIVE 03/07/2024 1239   LEUKOCYTESUR NEGATIVE 03/07/2024 1239   Sepsis Labs: Invalid input(s): PROCALCITONIN, LACTICIDVEN  Microbiology: No results found for this or any previous visit (from the past 240 hours).  Radiology Studies: No results found.    Kayleanna Lorman T. Valina Maes [facility]  If 7PM-7AM, please contact night-coverage [email or web address] 07/02/2024, 12:52 PM

## 2024-07-02 NOTE — Plan of Care (Signed)
  Problem: Education: Goal: Knowledge of General Education information will improve Description: Including pain rating scale, medication(s)/side effects and non-pharmacologic comfort measures Outcome: Progressing   Problem: Health Behavior/Discharge Planning: Goal: Ability to manage health-related needs will improve Outcome: Progressing   Problem: Nutrition: Goal: Adequate nutrition will be maintained Outcome: Progressing   Problem: Pain Managment: Goal: General experience of comfort will improve and/or be controlled Outcome: Progressing   Problem: Safety: Goal: Ability to remain free from injury will improve Outcome: Progressing

## 2024-07-02 NOTE — NC FL2 (Signed)
 Carrie [name]  MEDICAID FL2 LEVEL OF CARE FORM     IDENTIFICATION  Patient Name: Carrie [name] Birthdate: [birth year] Sex: female Admission Date (Current Location): 06/30/2024  Carrie [name] and [place] Number:  Producer, television/film/video and Address:  [facility], [address], [place], [place] [address]      Provider Number: [id number]  Attending Physician Name and Address:  Kathrin Mignon DASEN, MD  Relative Name and Phone Number:       Current Level of Care: Hospital Recommended Level of Care: Skilled Nursing Facility Prior Approval Number:    Date Approved/Denied:   PASRR Number: [id number]  Discharge Plan: SNF    Current Diagnoses: Patient Active Problem List   Diagnosis Date Noted   Fracture of femur, trochanteric (HCC) 06/30/2024   S/P reverse total shoulder arthroplasty, left 03/06/2024   Acute on chronic anemia 03/05/2024   OP (osteoporosis) 10/21/2023   Moderate persistent asthma 10/26/2022   Stage 3a chronic kidney disease (CKD) (HCC) 04/06/2022   Microcytic anemia 03/11/2022   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/16/2020   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/2020   Psoriatic arthritis (HCC) 07/08/2017   Mild neurocognitive disorder 10/18/2016   Type 2 diabetes mellitus with complication, without long-term current use of insulin  (HCC) 12/04/2013   Essential hypertension 04/21/2013   Hyperlipidemia 01/09/2008   OBESITY 01/09/2008   GERD with esophageal stricture and stenosis s/p dilation  01/09/2008   HERNIA, VENTRAL 01/09/2008   Anxiety and depression 03/25/2007    Orientation RESPIRATION BLADDER Height & Weight     Time, Situation, Place, Self  Normal Incontinent Weight: 146 lb 8.3 oz (66.5 kg) Height:  5' (152.4 cm)  BEHAVIORAL SYMPTOMS/MOOD NEUROLOGICAL BOWEL NUTRITION STATUS      Continent Diet (See DC Summary)  AMBULATORY STATUS COMMUNICATION OF NEEDS Skin   Extensive Assist Verbally Normal                        Personal Care Assistance Level of Assistance  Bathing, Feeding, Dressing Bathing Assistance: Maximum assistance Feeding assistance: Limited assistance Dressing Assistance: Maximum assistance     Functional Limitations Info  Sight, Speech, Hearing Sight Info: Adequate Hearing Info: Adequate Speech Info: Adequate    SPECIAL CARE FACTORS FREQUENCY  PT (By licensed PT), OT (By licensed OT)     PT Frequency: 5x/week OT Frequency: 5x/week            Contractures Contractures Info: Not present    Additional Factors Info  Code Status, Allergies Code Status Info: Full Allergies Info: Lisinopril           Current Medications (07/02/2024):  This is the current hospital active medication list Current Facility-Administered Medications  Medication Dose Route Frequency Provider Last Rate Last Admin   acetaminophen  (TYLENOL ) tablet 650 mg  650 mg Oral Q6H WA Gonfa, Taye T, MD   650 mg at 07/02/24 1407   albuterol  (PROVENTIL ) (2.5 MG/3ML) 0.083% nebulizer solution 2.5 mg  2.5 mg Nebulization Q4H PRN Segars, Jonathan, MD       amLODipine  (NORVASC ) tablet 2.5 mg  2.5 mg Oral Daily Segars, Dorn, MD   2.5 mg at 07/02/24 1037   aspirin  EC tablet 81 mg  81 mg Oral Daily Segars, Jonathan, MD   81 mg at 07/02/24 1036   busPIRone  (BUSPAR ) tablet 10 mg  10 mg Oral QHS Segars, Jonathan, MD   10 mg at 07/01/24 2115   camphor-menthol  [facility])  lotion   Topical PRN Chavez, Abigail, NP   Given at 07/01/24 2242   clotrimazole  (LOTRIMIN ) 1 % cream   Topical BID Sigdel, Santosh, MD   Given at 07/02/24 1041   diltiazem  (CARDIZEM  CD) 24 hr capsule 240 mg  240 mg Oral Daily Segars, Jonathan, MD   240 mg at 07/02/24 1037   DULoxetine  (CYMBALTA ) DR capsule 60 mg  60 mg Oral Daily Segars, Jonathan, MD   60 mg at 07/02/24 1037   enoxaparin  (LOVENOX ) injection 40 mg  40 mg Subcutaneous Q24H Segars, Jonathan, MD   40 mg at 07/02/24 1036   ferrous sulfate  tablet 325 mg  325 mg Oral Daily Gonfa, Taye T, MD    325 mg at 07/02/24 1413   HYDROmorphone  (DILAUDID ) injection 0.5 mg  0.5 mg Intravenous Q4H PRN Segars, Jonathan, MD   0.5 mg at 07/01/24 0300   loratadine  (CLARITIN ) tablet 10 mg  10 mg Oral Daily PRN Chavez, Abigail, NP   10 mg at 07/01/24 2242   losartan  (COZAAR ) tablet 100 mg  100 mg Oral Daily Segars, Jonathan, MD   100 mg at 07/02/24 1037   melatonin tablet 6 mg  6 mg Oral QHS PRN Segars, Jonathan, MD       metFORMIN  (GLUCOPHAGE -XR) 24 hr tablet 500 mg  500 mg Oral BID WC Gonfa, Taye T, MD       methocarbamol  (ROBAXIN ) tablet 500 mg  500 mg Oral Q8H PRN Segars, Jonathan, MD   500 mg at 07/01/24 1842   mirabegron  ER (MYRBETRIQ ) tablet 25 mg  25 mg Oral Daily Gonfa, Taye T, MD   25 mg at 07/02/24 1412   ondansetron  (ZOFRAN ) injection 4 mg  4 mg Intravenous Q6H PRN Keturah Carrier, MD       oxyCODONE  (Oxy IR/ROXICODONE ) immediate release tablet 2.5 mg  2.5 mg Oral Q4H PRN Keturah Carrier, MD       Or   oxyCODONE  (Oxy IR/ROXICODONE ) immediate release tablet 5 mg  5 mg Oral Q4H PRN Segars, Jonathan, MD   5 mg at 07/02/24 1037   pantoprazole  (PROTONIX ) EC tablet 40 mg  40 mg Oral Q0600 Segars, Jonathan, MD   40 mg at 07/02/24 9491   polyethylene glycol (MIRALAX  / GLYCOLAX ) packet 17 g  17 g Oral BID PRN Gonfa, Taye T, MD       pravastatin  (PRAVACHOL ) tablet 80 mg  80 mg Oral Daily Segars, Jonathan, MD   80 mg at 07/02/24 1037   primidone  (MYSOLINE ) tablet 125 mg  125 mg Oral BID Segars, Jonathan, MD   125 mg at 07/02/24 1038   senna-docusate (Senokot-S) tablet 1 tablet  1 tablet Oral Daily Gonfa, Taye T, MD   1 tablet at 07/02/24 1413   Facility-Administered Medications Ordered in Other Encounters  Medication Dose Route Frequency Provider Last Rate Last Admin   denosumab  (PROLIA ) injection 60 mg  60 mg Subcutaneous Once Hawkes, Angela, MD         Discharge Medications: Please see discharge summary for a list of discharge medications.  Relevant Imaging Results:  Relevant Lab  Results:   Additional Information SSN: [id number]  Carrie [name], LCSWA

## 2024-07-02 NOTE — Progress Notes (Signed)
 Physical Therapy Treatment Patient Details Name: Carrie [name] MRN: [id number] DOB: [birth year] Today's Date: 07/02/2024   History of Present Illness Carrie [name] is a 68 y.o. female who presented [facility] ED 06/30/24 after mechanical fall, sustaining left periprosthetic greater trochanteric fracture. Plan for non-operative management. PMHx: CVA, HTN, HLD, T2DM, CKD 3A, moderate persistent asthma, anemia of chronic disease, psoriatic arthritis, osteoporosis, GERD, and mood disorder.   PT Comments  Pt greeted supine in bed, pleasant and agreeable to PT session. Educated pt on LLE HEP and provided handout. She performed a couple of exercises to demonstrate understanding. Pt recalled precaution of no active L hip ABD. She completed OOB mobility using stedy. Pt demonstrated a right lateral lean to offload L hip d/t pain in sitting and standing. Pt quickly fatigued during sit<>stands. She maintained static stance for a maximum of ~30 seconds. Will continue to follow acutely and advance appropriately.     If plan is discharge home, recommend the following: Two people to help with walking and/or transfers;Two people to help with bathing/dressing/bathroom;Assistance with cooking/housework;Assist for transportation;Help with stairs or ramp for entrance   Can travel by private vehicle     No  Equipment Recommendations  Hospital bed;Hoyer lift;Wheelchair (measurements PT);Wheelchair cushion (measurements PT)    Recommendations for Other Services       Precautions / Restrictions Precautions Precautions: Fall Recall of Precautions/Restrictions: Impaired Precaution/Restrictions Comments: Fearful of Falling Restrictions Weight Bearing Restrictions Per Provider Order: Yes LLE Weight Bearing Per Provider Order: Weight bearing as tolerated Other Position/Activity Restrictions: No active left hip abduction     Mobility  Bed Mobility Overal bed mobility: Needs Assistance Bed Mobility: Supine to Sit      Supine to sit: HOB elevated, Used rails, Mod assist, +2 for safety/equipment     General bed mobility comments: Pt sat up on R side of bed with increased time. She slowly brought RLE towards EOB. Assist to manage LLE, PT off weighted and pt able to complete hip adduction. Cues for sequencing and use of bedrail. Assist to bring LLE fully off bed and elevate trunk with use of bed pad.    Transfers Overall transfer level: Needs assistance Equipment used: Ambulation equipment used Transfers: Sit to/from Stand, Bed to chair/wheelchair/BSC Sit to Stand: Min assist, Contact guard assist, +2 physical assistance, From elevated surface           General transfer comment: Introduced stedy and educated pt on proper use. She stood from slightly raised bed height. Pt able to reach with BUE support onto cross bar. Powered up with minA x2. Pt able to maintain static stance for ~30sec before sitting on stedy seat. Pt stood x4 reps from stedy pads with CGA-minA x2. As pt fatigued she required increased assist and demonstrated a R lateral lean. Fair eccentric control with sitting.    Ambulation/Gait                   Stairs             Wheelchair Mobility     Tilt Bed    Modified Rankin (Stroke Patients Only)       Balance Overall balance assessment: Needs assistance Sitting-balance support: Feet supported, Single extremity supported Sitting balance-Leahy Scale: Fair Sitting balance - Comments: Pt sat EOB with supervision. She demonstrated a R lateral lean to offload L hip d/t pain initially able to reach neutral posture. Postural control: Right lateral lean Standing balance support: Bilateral upper extremity supported, During functional  activity, Reliant on assistive device for balance Standing balance-Leahy Scale: Poor Standing balance comment: Pt dependent on stedy. She required +2 assist for safety/stability. As pt fatigued, developed a R lateral lean                             Communication Communication Communication: No apparent difficulties  Cognition Arousal: Alert Behavior During Therapy: WFL for tasks assessed/performed   PT - Cognitive impairments: No apparent impairments                         Following commands: Intact      Cueing Cueing Techniques: Verbal cues  Exercises General Exercises - Lower Extremity Ankle Circles/Pumps: Both, AROM, 10 reps Long Arc Quad: Seated, Left, AROM, 10 reps Heel Slides: Supine, Left, AROM, 10 reps Other Exercises Other Exercises: Educated pt with LLE HEP. Provided handout.    General Comments General comments (skin integrity, edema, etc.): VSS on RA      Pertinent Vitals/Pain Pain Assessment Pain Assessment: Faces Faces Pain Scale: Hurts even more Pain Location: L hip with movement and weight-bearing Pain Descriptors / Indicators: Guarding, Grimacing, Discomfort, Aching, Sore, Tender Pain Intervention(s): Monitored during session, Limited activity within patient's tolerance, Repositioned    Home Living                          Prior Function            PT Goals (current goals can now be found in the care plan section) Acute Rehab PT Goals Patient Stated Goal: Feel better and tolerate moving more Progress towards PT goals: Progressing toward goals    Frequency    Min 2X/week      PT Plan      Co-evaluation              AM-PAC PT 6 Clicks Mobility   Outcome Measure  Help needed turning from your back to your side while in a flat bed without using bedrails?: A Lot Help needed moving from lying on your back to sitting on the side of a flat bed without using bedrails?: A Lot Help needed moving to and from a bed to a chair (including a wheelchair)?: Total Help needed standing up from a chair using your arms (e.g., wheelchair or bedside chair)?: Total Help needed to walk in hospital room?: Total Help needed climbing 3-5 steps with a railing? :  Total 6 Click Score: 8    End of Session Equipment Utilized During Treatment: Gait belt Activity Tolerance: Patient tolerated treatment well;Patient limited by fatigue;Patient limited by pain Patient left: in chair;with call bell/phone within reach;with chair alarm set Nurse Communication: Mobility status;Need for lift equipment (stedy) PT Visit Diagnosis: Other abnormalities of gait and mobility (R26.89);Pain;Difficulty in walking, not elsewhere classified (R26.2);Unsteadiness on feet (R26.81);History of falling (Z91.81) Pain - Right/Left: Left Pain - part of body: Hip     Time: 1131-1155 PT Time Calculation (min) (ACUTE ONLY): 24 min  Charges:    $Therapeutic Exercise: 8-22 mins $Therapeutic Activity: 8-22 mins PT General Charges $$ ACUTE PT VISIT: 1 Visit                     Randall SAUNDERS, PT, DPT Acute Rehabilitation Services Office: [phone] Secure Chat Preferred  Delon CHRISTELLA Callander 07/02/2024, 12:58 PM

## 2024-07-02 NOTE — Plan of Care (Signed)

## 2024-07-03 DIAGNOSIS — S72102A Unspecified trochanteric fracture of left femur, initial encounter for closed fracture: Secondary | ICD-10-CM | POA: Diagnosis not present

## 2024-07-03 DIAGNOSIS — S72112A Displaced fracture of greater trochanter of left femur, initial encounter for closed fracture: Secondary | ICD-10-CM | POA: Diagnosis not present

## 2024-07-03 LAB — CBC
HCT: 29.9 % — ABNORMAL LOW (ref 36.0–46.0)
Hemoglobin: 9.2 g/dL — ABNORMAL LOW (ref 12.0–15.0)
MCH: 23.5 pg — ABNORMAL LOW (ref 26.0–34.0)
MCHC: 30.8 g/dL (ref 30.0–36.0)
MCV: 76.3 fL — ABNORMAL LOW (ref 80.0–100.0)
Platelets: 328 K/uL (ref 150–400)
RBC: 3.92 MIL/uL (ref 3.87–5.11)
RDW: 19.5 % — ABNORMAL HIGH (ref 11.5–15.5)
WBC: 7.2 K/uL (ref 4.0–10.5)
nRBC: 0 % (ref 0.0–0.2)

## 2024-07-03 MED ORDER — IRON SUCROSE 300 MG IVPB - SIMPLE MED
300.0000 mg | Freq: Once | Status: AC
  Administered 2024-07-03: 300 mg via INTRAVENOUS
  Filled 2024-07-03: qty 300

## 2024-07-03 MED ORDER — POLYETHYLENE GLYCOL 3350 17 G PO PACK
17.0000 g | PACK | Freq: Two times a day (BID) | ORAL | Status: AC
  Administered 2024-07-03 (×2): 17 g via ORAL
  Filled 2024-07-03 (×2): qty 1

## 2024-07-03 MED ORDER — SENNOSIDES-DOCUSATE SODIUM 8.6-50 MG PO TABS: 2.0000 | ORAL_TABLET | Freq: Two times a day (BID) | ORAL | Status: DC | PRN

## 2024-07-03 MED ORDER — SENNOSIDES-DOCUSATE SODIUM 8.6-50 MG PO TABS
2.0000 | ORAL_TABLET | Freq: Two times a day (BID) | ORAL | Status: AC
  Administered 2024-07-03 (×2): 2 via ORAL
  Filled 2024-07-03 (×2): qty 2

## 2024-07-03 MED ORDER — POLYETHYLENE GLYCOL 3350 17 G PO PACK: 17.0000 g | PACK | Freq: Two times a day (BID) | ORAL | Status: DC | PRN

## 2024-07-03 NOTE — Progress Notes (Signed)
 PROGRESS NOTE  Carrie [name] FMW:[id number] DOB: [birth year]   PCP: Jolee Madelin Patch, MD  Patient is from: Home  DOA: 06/30/2024 LOS: 0  Chief complaints Chief Complaint  Patient presents with   Fall     Brief Narrative / Interim history: 68 year old with PMH of CVA, EtOH cirrhosis, mild neurocognitive disorder, DM-2, CKD-3A, osteoporosis, anemia of chronic disease, asthma, HTN, HLD, mood disorder and GERD brought to ED after she had a ground-level fall after she lost her balance when she tried to brush away a monster truck toy off the last step on her stair.  She hit her left side of her head and her left hip and had difficulty with mobilization due to severity of her pain.  No prodrome leading to fall.  In ED, stable vitals.  Labs without significant finding.  Left hip/pelvic/knee x-ray without acute finding.  CT head and cervical spine without acute finding.  Left hip CT showed comminuted mildly displaced fracture through the greater trochanter that extends to the lateral aspect of femoral component of left hip arthroplasty.  Orthopedic surgery consulted and recommended nonoperative management with WBAT with no active left hip abduction.  Patient admitted for pain control and therapy evaluation.  Vitamin D  level low at 18.  Anemia panel with iron  deficiency and low vitamin B12.  Started vitamin D .  Received IV Venofer  300 mg and B12 injection 1000 mcg once.  She will continue oral supplementation on discharge.   Therapy recommended SNF.  Subjective: Seen and examined earlier this morning.  No major events overnight or this morning.  Reports pain in her hip.  Rates her pain 6/10.  Has not a bowel movement yet.  Objective: Vitals:   07/02/24 1622 07/02/24 2021 07/03/24 0535 07/03/24 0758  BP: 117/64 128/60 (!) 140/64 (!) 154/83  Pulse: 87 88 95 (!) 101  Resp: 17 18 18 16   Temp: 98.2 F (36.8 C) 97.8 F (36.6 C) 98 F (36.7 C) 98.6 F (37 C)  TempSrc: Oral Oral Oral Oral   SpO2: 94% 95% 93% 96%  Weight:      Height:        Examination:  GENERAL: No apparent distress.  Nontoxic. HEENT: MMM.  Vision and hearing grossly intact.  NECK: Supple.  No apparent JVD.  RESP:  No IWOB.  Fair aeration bilaterally. CVS:  RRR. Heart sounds normal.  ABD/GI/GU: BS+. Abd soft, NTND.  MSK/EXT:  Moves extremities but limited in left leg due to pain. No apparent deformity. No edema.  Tenderness over left hip.  No bruise or hematoma. SKIN: no apparent skin lesion or wound NEURO: AA.  Oriented appropriately.  No apparent focal neuro deficit. PSYCH: Calm. Normal affect.   Consultants:  Orthopedic surgery  Procedures: None  Microbiology summarized: None  Assessment and plan: Ground-level fall-mechanical fall. Left periprosthetic greater trochanteric fracture due to fall and osteoporosis Osteoporosis: Seems to be on Prolia  outpatient Vitamin D  deficiency: Vitamin D  level low at 18. -Ortho, Dr. Reyne recommended WBAT and  no active abduction of the L hip -Pain control.  Scheduled Tylenol  with as needed Robaxin , oxycodone  and Dilaudid . -Vitamin D  50,000 units weekly.  Continue calcium  supplementation.  Continue Prolia  outpatient. -Increased and scheduled Senokot and MiraLAX  until she has bowel movement -Outpatient follow-up with orthopedic surgery after discharge -Therapy recommended SNF.  Iron  deficiency anemia superimposed on anemia of renal disease: Anemia panel with iron  and vitamin D  deficiency.  H&H relatively stable.  May be some element of hemoconcentration on presentation.  Recent Labs    03/05/24 1348 03/06/24 0700 03/06/24 0704 03/06/24 0945 03/06/24 1830 03/07/24 0649 06/30/24 1615 06/30/24 1630 07/02/24 1506 07/03/24 0326  HGB 6.5* 7.3* 8.2* 7.4* 7.6* 8.4* 11.9* 10.9* 8.9* 9.2*  - IV Venofer  300 mg x 1 followed by p.o. ferrous sulfate  - Vitamin B12 injection 1000 mcg.  Will continue p.o. supplementation - Recheck CBC outpatient    Chronic  medical problems: CVA hyperlipidemia: continue home Aspirin , pravastatin  Hypertension: Continue home  losartan , diltiazem .  Discontinue amlodipine . NIDDM-2: A1c 7.0% on 8/25.  On metformin , Ozempic  outpatient.  Continue home metformin . CKD-3A: Stable. Moderate persistent asthma: Continue home beclomethasone equivalent, nebs as needed Psoriatic arthritis: On skyrizi outpatient.  GERD: Sub PPI Anxiety and depression: Continue home duloxetine , buspirone  Tremor?: Continue home primidone  Overactive bladder: Continue home Myrbetriq   Body mass index is 28.62 kg/m.           DVT prophylaxis:  enoxaparin  (LOVENOX ) injection 40 mg Start: 07/01/24 1000  Code Status: Full code Family Communication: None at bedside Level of care: Med-Surg Status is: Observation The patient will require care spanning > 2 midnights and should be moved to inpatient because: Left hip fracture,   Final disposition: SNF.  Medically stable for discharge.   35 minutes with more than 50% spent in reviewing records, counseling patient/family and coordinating care.   Sch Meds:  Scheduled Meds:  acetaminophen   650 mg Oral Q6H WA   amLODipine   2.5 mg Oral Daily   aspirin  EC  81 mg Oral Daily   busPIRone   10 mg Oral QHS   clotrimazole    Topical BID   vitamin B-12  1,000 mcg Oral Daily   diltiazem   240 mg Oral Daily   DULoxetine   60 mg Oral Daily   enoxaparin  (LOVENOX ) injection  40 mg Subcutaneous Q24H   ferrous sulfate   325 mg Oral Daily   losartan   100 mg Oral Daily   metFORMIN   500 mg Oral BID WC   mirabegron  ER  25 mg Oral Daily   pantoprazole   40 mg Oral Q0600   polyethylene glycol  17 g Oral BID   pravastatin   80 mg Oral Daily   primidone   125 mg Oral BID   senna-docusate  2 tablet Oral BID   Vitamin D  (Ergocalciferol )  50,000 Units Oral Q7 days   Continuous Infusions: PRN Meds:.albuterol , camphor-menthol , HYDROmorphone  (DILAUDID ) injection, loratadine , melatonin, methocarbamol , ondansetron   (ZOFRAN ) IV, oxyCODONE  **OR** oxyCODONE , polyethylene glycol **FOLLOWED BY** [START ON 07/04/2024] polyethylene glycol, senna-docusate **FOLLOWED BY** [START ON 07/04/2024] senna-docusate  Antimicrobials: Anti-infectives (From admission, onward)    None        I have personally reviewed the following labs and images: CBC: Recent Labs  Lab 06/30/24 1615 06/30/24 1630 07/02/24 1506 07/03/24 0326  WBC  --  8.6 7.8 7.2  NEUTROABS  --  5.5  --   --   HGB 11.9* 10.9* 8.9* 9.2*  HCT 35.0* 36.2 29.5* 29.9*  MCV  --  77.4* 77.0* 76.3*  PLT  --  333 330 328   BMP &GFR Recent Labs  Lab 06/30/24 1615 07/02/24 1506  NA 139 134*  K 4.4 4.1  CL 105 98  CO2  --  25  GLUCOSE 117* 171*  BUN 20 16  CREATININE 1.00 1.10*  CALCIUM   --  8.9  MG  --  1.8  PHOS  --  3.3   Estimated Creatinine Clearance: 42.8 mL/min (A) (by C-G formula based on SCr of 1.1 mg/dL (H)). Liver &  Pancreas: Recent Labs  Lab 07/02/24 1506  ALBUMIN 2.9*   No results for input(s): LIPASE, AMYLASE in the last 168 hours. No results for input(s): AMMONIA in the last 168 hours. Diabetic: No results for input(s): HGBA1C in the last 72 hours. No results for input(s): GLUCAP in the last 168 hours. Cardiac Enzymes: No results for input(s): CKTOTAL, CKMB, CKMBINDEX, TROPONINI in the last 168 hours. No results for input(s): PROBNP in the last 8760 hours. Coagulation Profile: No results for input(s): INR, PROTIME in the last 168 hours. Thyroid  Function Tests: Recent Labs    07/02/24 1506  TSH 1.493   Lipid Profile: No results for input(s): CHOL, HDL, LDLCALC, TRIG, CHOLHDL, LDLDIRECT in the last 72 hours. Anemia Panel: Recent Labs    07/02/24 1506  VITAMINB12 201  FOLATE 7.6  FERRITIN 36  TIBC 265  IRON  19*  RETICCTPCT 1.4   Urine analysis:    Component Value Date/Time   COLORURINE STRAW (A) 03/07/2024 1239   APPEARANCEUR CLEAR 03/07/2024 1239   LABSPEC 1.008  03/07/2024 1239   PHURINE 6.0 03/07/2024 1239   GLUCOSEU NEGATIVE 03/07/2024 1239   HGBUR NEGATIVE 03/07/2024 1239   BILIRUBINUR NEGATIVE 03/07/2024 1239   KETONESUR NEGATIVE 03/07/2024 1239   PROTEINUR NEGATIVE 03/07/2024 1239   UROBILINOGEN 0.2 06/25/2015 1600   NITRITE NEGATIVE 03/07/2024 1239   LEUKOCYTESUR NEGATIVE 03/07/2024 1239   Sepsis Labs: Invalid input(s): PROCALCITONIN, LACTICIDVEN  Microbiology: No results found for this or any previous visit (from the past 240 hours).  Radiology Studies: No results found.    Carrie [name] T. Lanaiya Lantry [facility]  If 7PM-7AM, please contact night-coverage [email or web address] 07/03/2024, 2:24 PM

## 2024-07-03 NOTE — TOC Progression Note (Addendum)
 Transition of Care [facility]) - Progression Note    Patient Details  Name: Carrie [name] MRN: [id number] Date of Birth: 1955/11/14  Transition of Care [facility]) CM/SW Contact  Alyanah Elliott LITTIE Moose, [place] Phone Number: 07/03/2024, 12:34 PM  Clinical Narrative:    CSW provided pt with SNF bed offers, pt wants [facility] but CSW has not gotten a response back from them yet. CSW reached out to [facility] to see if they will accept pt before pt chooses a different facility. CSW will continue to follow.  3:14 PM- CSW informed pt [facility] is unable to accept her, pt now wants [facility]. CSW will check bed availability and initiate insurance process.   Expected Discharge Plan: Skilled Nursing Facility Barriers to Discharge: Insurance Authorization, SNF Pending bed offer, Continued Medical Work up               Expected Discharge Plan and Services       Living arrangements for the past 2 months: Single Family Home                                       Social Drivers of Health (SDOH) Interventions SDOH Screenings   Food Insecurity: No Food Insecurity (06/30/2024)  Housing: Low Risk  (06/30/2024)  Transportation Needs: No Transportation Needs (06/30/2024)  Utilities: Not At Risk (06/30/2024)  Depression (PHQ2-9): Low Risk  (04/03/2024)  Social Connections: Unknown (06/30/2024)  Tobacco Use: Low Risk  (07/01/2024)    Readmission Risk Interventions    03/06/2024    2:48 PM  Readmission Risk Prevention Plan  Transportation Screening Complete  PCP or Specialist Appt within 5-7 Days Complete  Home Care Screening Complete  Medication Review (RN CM) Complete

## 2024-07-03 NOTE — TOC Progression Note (Addendum)
 Transition of Care [facility]) - Progression Note    Patient Details  Name: Carrie [name] MRN: [id number] Date of Birth: 02-Sep-1956  Transition of Care [facility]) CM/SW Contact  Levester Waldridge LITTIE Moose, [place] Phone Number: 07/03/2024, 3:40 PM  Clinical Narrative:    CSW initiated insurance process and received auth approval for [facility] ID# [id number]. Effective 9/20-9/23/25. [facility] will have a bed available for pt on Monday 9/22. Expected Discharge Plan: Skilled Nursing Facility Barriers to Discharge: Insurance Authorization, SNF Pending bed offer, Continued Medical Work up               Expected Discharge Plan and Services       Living arrangements for the past 2 months: Single Family Home                                       Social Drivers of Health (SDOH) Interventions SDOH Screenings   Food Insecurity: No Food Insecurity (06/30/2024)  Housing: Low Risk  (06/30/2024)  Transportation Needs: No Transportation Needs (06/30/2024)  Utilities: Not At Risk (06/30/2024)  Depression (PHQ2-9): Low Risk  (04/03/2024)  Social Connections: Unknown (06/30/2024)  Tobacco Use: Low Risk  (07/01/2024)    Readmission Risk Interventions    03/06/2024    2:48 PM  Readmission Risk Prevention Plan  Transportation Screening Complete  PCP or Specialist Appt within 5-7 Days Complete  Home Care Screening Complete  Medication Review (RN CM) Complete

## 2024-07-03 NOTE — TOC Progression Note (Signed)
 Transition of Care (TOC) - Progression Note    Patient was considering going home at discharge. Patient lives with son , son's girlfriend and their 2 children 76 and 67 years old   Patient called son Donnajean to discuss. PT recommendations if she goes home hospital nbed, hoyer lift and wheelchair. Patient requires two person assistance. Patient and Donnajean discussed options and patient decided she will go to SNF for short term rehab. She prefers [facility] . SW aware.   Patient Details  Name: Carrie [name] MRN: [id number] Date of Birth: 11/22/1955  Transition of [facility]) CM/SW Contact  Darrly Loberg, [place] Jansky, RN Phone Number: 07/03/2024, 3:17 PM  Clinical Narrative:       Expected Discharge Plan: Skilled Nursing Facility Barriers to Discharge: Insurance Authorization, SNF Pending bed offer, Continued Medical Work up               Expected Discharge Plan and Services       Living arrangements for the past 2 months: Single Family Home                                       Social Drivers of Health (SDOH) Interventions SDOH Screenings   Food Insecurity: No Food Insecurity (06/30/2024)  Housing: Low Risk  (06/30/2024)  Transportation Needs: No Transportation Needs (06/30/2024)  Utilities: Not At Risk (06/30/2024)  Depression (PHQ2-9): Low Risk  (04/03/2024)  Social Connections: Unknown (06/30/2024)  Tobacco Use: Low Risk  (07/01/2024)    Readmission Risk Interventions    03/06/2024    2:48 PM  Readmission Risk Prevention Plan  Transportation Screening Complete  PCP or Specialist Appt within 5-7 Days Complete  Home Care Screening Complete  Medication Review (RN CM) Complete

## 2024-07-04 DIAGNOSIS — S72112A Displaced fracture of greater trochanter of left femur, initial encounter for closed fracture: Secondary | ICD-10-CM | POA: Diagnosis not present

## 2024-07-04 DIAGNOSIS — S72102A Unspecified trochanteric fracture of left femur, initial encounter for closed fracture: Secondary | ICD-10-CM | POA: Diagnosis not present

## 2024-07-04 MED ORDER — POLYETHYLENE GLYCOL 3350 17 G PO PACK: 17.0000 g | PACK | Freq: Two times a day (BID) | ORAL | Status: DC | PRN

## 2024-07-04 MED ORDER — POLYETHYLENE GLYCOL 3350 17 G PO PACK
34.0000 g | PACK | Freq: Once | ORAL | Status: AC
  Administered 2024-07-04: 34 g via ORAL
  Filled 2024-07-04: qty 2

## 2024-07-04 MED ORDER — HYDRALAZINE HCL 25 MG PO TABS: 25.0000 mg | ORAL_TABLET | Freq: Four times a day (QID) | ORAL | Status: DC | PRN

## 2024-07-04 MED ORDER — POLYETHYLENE GLYCOL 3350 17 G PO PACK: 34.0000 g | PACK | Freq: Two times a day (BID) | ORAL | Status: DC

## 2024-07-04 MED ORDER — SENNOSIDES-DOCUSATE SODIUM 8.6-50 MG PO TABS: 1.0000 | ORAL_TABLET | Freq: Two times a day (BID) | ORAL | Status: DC | PRN

## 2024-07-04 MED ORDER — SENNOSIDES-DOCUSATE SODIUM 8.6-50 MG PO TABS
2.0000 | ORAL_TABLET | Freq: Two times a day (BID) | ORAL | Status: AC
  Administered 2024-07-04 (×2): 2 via ORAL
  Filled 2024-07-04 (×2): qty 2

## 2024-07-04 MED ORDER — DILTIAZEM HCL ER COATED BEADS 180 MG PO CP24
360.0000 mg | ORAL_CAPSULE | Freq: Every day | ORAL | Status: DC
  Administered 2024-07-04 – 2024-07-06 (×3): 360 mg via ORAL
  Filled 2024-07-04 (×2): qty 2

## 2024-07-04 NOTE — Plan of Care (Signed)

## 2024-07-04 NOTE — Progress Notes (Signed)
 Mobility Specialist Progress Note   07/04/24 0955  Mobility  Activity Stood at bedside;Pivoted/transferred from bed to chair  Level of Assistance Standby assist, set-up cues, supervision of patient - no hands on  Assistive Device Stedy  Range of Motion/Exercises Active;All extremities  RLE Weight Bearing Per Provider Order WBAT  LLE Weight Bearing Per Provider Order WBAT  Activity Response Tolerated well  Mobility Specialist Start Time (ACUTE ONLY) 0955  Mobility Specialist Stop Time (ACUTE ONLY) 1030  Mobility Specialist Time Calculation (min) (ACUTE ONLY) 35 min   Patient received in supine, eager to participate and requesting assistance to restroom. Reported having no pain and feeling better than days past. Reviewed hip precautions and patient was able to correctly recite. She needed minimal HHA for bed mobility more so assisting LLE, then stood from elevated bed height onto [place] with supervision. Patient was then assisted to restroom and needed min A to descend to toilet with good eccentric control. NT present to assist with pericare then was assisted to recliner chair. Patient with improved pain, tolerated this session better than the past. Was left with all needs met, call bell in reach.   [place] Jeanine Caven, BS EXP Mobility Specialist Please contact via SecureChat or Rehab office at [phone]

## 2024-07-04 NOTE — TOC Progression Note (Signed)
 Transition of Care [facility]) - Progression Note    Patient Details  Name: Carrie [name] MRN: [id number] Date of Birth: Mar 29, 1956  Transition [facility]) CM/SW Contact  Isaiah Public, LCSWA Phone Number: 07/04/2024, 3:45 PM  Clinical Narrative:     Patient has SNF bed at [facility] on Monday if medically ready. Insurance authorization approved. CSW will continue to follow.  Expected Discharge Plan: Skilled Nursing Facility Barriers to Discharge: Insurance Authorization, SNF Pending bed offer, Continued Medical Work up               Expected Discharge Plan and Services       Living arrangements for the past 2 months: Single Family Home                                       Social Drivers of Health (SDOH) Interventions SDOH Screenings   Food Insecurity: No Food Insecurity (06/30/2024)  Housing: Low Risk  (06/30/2024)  Transportation Needs: No Transportation Needs (06/30/2024)  Utilities: Not At Risk (06/30/2024)  Depression (PHQ2-9): Low Risk  (04/03/2024)  Social Connections: Unknown (06/30/2024)  Tobacco Use: Low Risk  (07/01/2024)    Readmission Risk Interventions    03/06/2024    2:48 PM  Readmission Risk Prevention Plan  Transportation Screening Complete  PCP or Specialist Appt within 5-7 Days Complete  Home Care Screening Complete  Medication Review (RN CM) Complete

## 2024-07-04 NOTE — Care Management Obs Status (Signed)
 MEDICARE OBSERVATION STATUS NOTIFICATION   Patient Details  Name: Carrie [name] MRN: [id number] Date of Birth: [birth year]   Medicare Observation Status Notification Given:  Yes    Jon Cruel 07/04/2024, 1:50 PM

## 2024-07-04 NOTE — Plan of Care (Signed)
  Problem: Education: Goal: Knowledge of General Education information will improve Description: Including pain rating scale, medication(s)/side effects and non-pharmacologic comfort measures Outcome: Progressing   Problem: Clinical Measurements: Goal: Will remain free from infection Outcome: Progressing Goal: Diagnostic test results will improve Outcome: Progressing   Problem: Nutrition: Goal: Adequate nutrition will be maintained Outcome: Progressing   Problem: Safety: Goal: Ability to remain free from injury will improve Outcome: Progressing   Problem: Skin Integrity: Goal: Risk for impaired skin integrity will decrease Outcome: Progressing   Problem: Clinical Measurements: Goal: Will remain free from infection Outcome: Progressing Goal: Diagnostic test results will improve Outcome: Progressing

## 2024-07-04 NOTE — Progress Notes (Signed)
 PROGRESS NOTE  MAIRELY FOXWORTH FMW:[id number] DOB: 10/29/1955   PCP: Jolee Madelin Patch, MD  Patient is from: Home  DOA: 06/30/2024 LOS: 0  Chief complaints Chief Complaint  Patient presents with   Fall     Brief Narrative / Interim history: 68 year old with PMH of CVA, EtOH cirrhosis, mild neurocognitive disorder, DM-2, CKD-3A, osteoporosis, anemia of chronic disease, asthma, HTN, HLD, mood disorder and GERD brought to ED after she had a ground-level fall after she lost her balance when she tried to brush away a monster truck toy off the last step on her stair.  She hit her left side of her head and her left hip and had difficulty with mobilization due to severity of her pain.  No prodrome leading to fall.  In ED, stable vitals.  Labs without significant finding.  Left hip/pelvic/knee x-ray without acute finding.  CT head and cervical spine without acute finding.  Left hip CT showed comminuted mildly displaced fracture through the greater trochanter that extends to the lateral aspect of femoral component of left hip arthroplasty.  Orthopedic surgery consulted and recommended nonoperative management with WBAT with no active left hip abduction.  Patient admitted for pain control and therapy evaluation.  Vitamin D  level low at 18.  Anemia panel with iron  deficiency and low vitamin B12.  Started vitamin D .  Received IV Venofer  300 mg and B12 injection 1000 mcg once.  She will continue oral supplementation on discharge.   Therapy recommended SNF.  Subjective: Seen and examined earlier this morning.  No major events overnight or this morning.  Pain fairly controlled.  Has not had a bowel movement yet.  Objective: Vitals:   07/03/24 1606 07/03/24 2058 07/04/24 0542 07/04/24 0808  BP: (!) 143/68 (!) 142/69 (!) 156/69 (!) 169/85  Pulse: 92 95 91 95  Resp: 16 18 18 17   Temp: 98.8 F (37.1 C) 97.7 F (36.5 C) 98.1 F (36.7 C) 98.1 F (36.7 C)  TempSrc: Oral Oral Oral Oral  SpO2:  100%  95% 97%  Weight:      Height:        Examination:  GENERAL: No apparent distress.  Nontoxic. HEENT: MMM.  Vision and hearing grossly intact.  NECK: Supple.  No apparent JVD.  RESP:  No IWOB.  Fair aeration bilaterally. CVS:  RRR. Heart sounds normal.  ABD/GI/GU: BS+. Abd soft, NTND.  MSK/EXT:  Moves extremities but limited in left leg due to pain. No apparent deformity. No edema.  Tenderness over left hip.  No bruise or hematoma. SKIN: no apparent skin lesion or wound NEURO: AA.  Oriented appropriately.  No apparent focal neuro deficit. PSYCH: Calm. Normal affect.   Consultants:  Orthopedic surgery  Procedures: None  Microbiology summarized: None  Assessment and plan: Ground-level fall-mechanical fall. Left periprosthetic greater trochanteric fracture due to fall and osteoporosis Osteoporosis: Seems to be on Prolia  outpatient Vitamin D  deficiency: Vitamin D  level low at 18. -Ortho, Dr. Reyne recommended WBAT and  no active abduction of the L hip -Pain control.  Scheduled Tylenol  with as needed Robaxin , oxycodone  and Dilaudid . -Vitamin D  50,000 units weekly.  Continue calcium  supplementation.  Continue Prolia  outpatient. -Continue scheduled Senokot and MiraLAX  until she has bowel movement -Outpatient follow-up with orthopedic surgery after discharge -Therapy recommended SNF.  Iron  deficiency anemia superimposed on anemia of renal disease: Anemia panel with iron  and vitamin D  deficiency.  H&H relatively stable.  May be some element of hemoconcentration on presentation. Recent Labs    03/05/24  1348 03/06/24 0700 03/06/24 0704 03/06/24 0945 03/06/24 1830 03/07/24 0649 06/30/24 1615 06/30/24 1630 07/02/24 1506 07/03/24 0326  HGB 6.5* 7.3* 8.2* 7.4* 7.6* 8.4* 11.9* 10.9* 8.9* 9.2*  - IV Venofer  300 mg x 1 and B12 injection 1000 mcg x 1  - Continue oral iron  and B12. - Recheck CBC outpatient  Uncontrolled hypertension: BP remains elevated. -Increase oral Cardizem .   Discontinue amlodipine . -Continue home losartan  -P.o. hydralazine  as needed -Pain control   Chronic medical problems: CVA hyperlipidemia: continue home Aspirin , pravastatin  NIDDM-2: A1c 7.0% on 8/25.  On metformin , Ozempic  outpatient.  Continue home metformin . CKD-3A: Stable. Moderate persistent asthma: Continue home beclomethasone equivalent, nebs as needed Psoriatic arthritis: On skyrizi outpatient.  GERD: Continue PPI. Anxiety and depression: Continue home duloxetine , buspirone  Tremor?: Continue home primidone  Overactive bladder: Continue home Myrbetriq   Body mass index is 28.62 kg/m.           DVT prophylaxis:  enoxaparin  (LOVENOX ) injection 40 mg Start: 07/01/24 1000  Code Status: Full code Family Communication: None at bedside Level of care: Med-Surg Status is: Observation The patient will require care spanning > 2 midnights and should be moved to inpatient because: Left hip fracture,   Final disposition: SNF.  Medically stable for discharge.   35 minutes with more than 50% spent in reviewing records, counseling patient/family and coordinating care.   Sch Meds:  Scheduled Meds:  acetaminophen   650 mg Oral Q6H WA   aspirin  EC  81 mg Oral Daily   busPIRone   10 mg Oral QHS   clotrimazole    Topical BID   vitamin B-12  1,000 mcg Oral Daily   diltiazem   360 mg Oral Daily   DULoxetine   60 mg Oral Daily   enoxaparin  (LOVENOX ) injection  40 mg Subcutaneous Q24H   ferrous sulfate   325 mg Oral Daily   losartan   100 mg Oral Daily   metFORMIN   500 mg Oral BID WC   mirabegron  ER  25 mg Oral Daily   pantoprazole   40 mg Oral Q0600   pravastatin   80 mg Oral Daily   primidone   125 mg Oral BID   senna-docusate  2 tablet Oral BID   Vitamin D  (Ergocalciferol )  50,000 Units Oral Q7 days   Continuous Infusions: PRN Meds:.albuterol , camphor-menthol , hydrALAZINE , HYDROmorphone  (DILAUDID ) injection, loratadine , melatonin, methocarbamol , ondansetron  (ZOFRAN ) IV, oxyCODONE   **OR** oxyCODONE , [COMPLETED] polyethylene glycol **FOLLOWED BY** polyethylene glycol, senna-docusate **FOLLOWED BY** [START ON 07/05/2024] senna-docusate  Antimicrobials: Anti-infectives (From admission, onward)    None        I have personally reviewed the following labs and images: CBC: Recent Labs  Lab 06/30/24 1615 06/30/24 1630 07/02/24 1506 07/03/24 0326  WBC  --  8.6 7.8 7.2  NEUTROABS  --  5.5  --   --   HGB 11.9* 10.9* 8.9* 9.2*  HCT 35.0* 36.2 29.5* 29.9*  MCV  --  77.4* 77.0* 76.3*  PLT  --  333 330 328   BMP &GFR Recent Labs  Lab 06/30/24 1615 07/02/24 1506  NA 139 134*  K 4.4 4.1  CL 105 98  CO2  --  25  GLUCOSE 117* 171*  BUN 20 16  CREATININE 1.00 1.10*  CALCIUM   --  8.9  MG  --  1.8  PHOS  --  3.3   Estimated Creatinine Clearance: 42.8 mL/min (A) (by C-G formula based on SCr of 1.1 mg/dL (H)). Liver & Pancreas: Recent Labs  Lab 07/02/24 1506  ALBUMIN 2.9*   No results  for input(s): LIPASE, AMYLASE in the last 168 hours. No results for input(s): AMMONIA in the last 168 hours. Diabetic: No results for input(s): HGBA1C in the last 72 hours. No results for input(s): GLUCAP in the last 168 hours. Cardiac Enzymes: No results for input(s): CKTOTAL, CKMB, CKMBINDEX, TROPONINI in the last 168 hours. No results for input(s): PROBNP in the last 8760 hours. Coagulation Profile: No results for input(s): INR, PROTIME in the last 168 hours. Thyroid  Function Tests: Recent Labs    07/02/24 1506  TSH 1.493   Lipid Profile: No results for input(s): CHOL, HDL, LDLCALC, TRIG, CHOLHDL, LDLDIRECT in the last 72 hours. Anemia Panel: Recent Labs    07/02/24 1506  VITAMINB12 201  FOLATE 7.6  FERRITIN 36  TIBC 265  IRON  19*  RETICCTPCT 1.4   Urine analysis:    Component Value Date/Time   COLORURINE STRAW (A) 03/07/2024 1239   APPEARANCEUR CLEAR 03/07/2024 1239   LABSPEC 1.008 03/07/2024 1239   PHURINE 6.0  03/07/2024 1239   GLUCOSEU NEGATIVE 03/07/2024 1239   HGBUR NEGATIVE 03/07/2024 1239   BILIRUBINUR NEGATIVE 03/07/2024 1239   KETONESUR NEGATIVE 03/07/2024 1239   PROTEINUR NEGATIVE 03/07/2024 1239   UROBILINOGEN 0.2 06/25/2015 1600   NITRITE NEGATIVE 03/07/2024 1239   LEUKOCYTESUR NEGATIVE 03/07/2024 1239   Sepsis Labs: Invalid input(s): PROCALCITONIN, LACTICIDVEN  Microbiology: No results found for this or any previous visit (from the past 240 hours).  Radiology Studies: No results found.    Claris Pech T. Lorianne Malbrough [facility]  If 7PM-7AM, please contact night-coverage [email or web address] 07/04/2024, 2:11 PM

## 2024-07-05 DIAGNOSIS — F32A Depression, unspecified: Secondary | ICD-10-CM

## 2024-07-05 DIAGNOSIS — E118 Type 2 diabetes mellitus with unspecified complications: Secondary | ICD-10-CM

## 2024-07-05 DIAGNOSIS — N1831 Chronic kidney disease, stage 3a: Secondary | ICD-10-CM

## 2024-07-05 DIAGNOSIS — G3184 Mild cognitive impairment, so stated: Secondary | ICD-10-CM | POA: Diagnosis not present

## 2024-07-05 DIAGNOSIS — I10 Essential (primary) hypertension: Secondary | ICD-10-CM | POA: Diagnosis not present

## 2024-07-05 DIAGNOSIS — D649 Anemia, unspecified: Secondary | ICD-10-CM

## 2024-07-05 DIAGNOSIS — L4050 Arthropathic psoriasis, unspecified: Secondary | ICD-10-CM

## 2024-07-05 DIAGNOSIS — F419 Anxiety disorder, unspecified: Secondary | ICD-10-CM

## 2024-07-05 DIAGNOSIS — S72112A Displaced fracture of greater trochanter of left femur, initial encounter for closed fracture: Secondary | ICD-10-CM | POA: Diagnosis not present

## 2024-07-05 DIAGNOSIS — S72102A Unspecified trochanteric fracture of left femur, initial encounter for closed fracture: Secondary | ICD-10-CM | POA: Diagnosis not present

## 2024-07-05 MED ORDER — MAGNESIUM CITRATE PO SOLN
1.0000 | Freq: Once | ORAL | Status: DC
  Filled 2024-07-05: qty 296

## 2024-07-05 MED ORDER — POLYETHYLENE GLYCOL 3350 17 G PO PACK: 17.0000 g | PACK | Freq: Two times a day (BID) | ORAL | Status: DC

## 2024-07-05 MED ORDER — MELATONIN 3 MG PO TABS
12.0000 mg | ORAL_TABLET | Freq: Every day | ORAL | Status: DC
  Administered 2024-07-05: 12 mg via ORAL
  Filled 2024-07-05: qty 4

## 2024-07-05 MED ORDER — POLYETHYLENE GLYCOL 3350 17 G PO PACK: 17.0000 g | PACK | Freq: Two times a day (BID) | ORAL | Status: DC | PRN

## 2024-07-05 MED ORDER — SENNOSIDES-DOCUSATE SODIUM 8.6-50 MG PO TABS
1.0000 | ORAL_TABLET | Freq: Every day | ORAL | Status: DC
  Administered 2024-07-05 – 2024-07-06 (×2): 1 via ORAL
  Filled 2024-07-05 (×2): qty 1

## 2024-07-05 MED ORDER — SENNOSIDES-DOCUSATE SODIUM 8.6-50 MG PO TABS: 2.0000 | ORAL_TABLET | Freq: Two times a day (BID) | ORAL | Status: DC

## 2024-07-05 NOTE — Progress Notes (Signed)
 PROGRESS NOTE  Carrie [name] FMW:[id number] DOB: [birth year]   PCP: Jolee Madelin Patch, MD  Patient is from: Home  DOA: 06/30/2024 LOS: 0  Chief complaints Chief Complaint  Patient presents with   Fall     Brief Narrative / Interim history: 68 year old with PMH of CVA, EtOH cirrhosis, mild neurocognitive disorder, DM-2, CKD-3A, osteoporosis, anemia of chronic disease, asthma, HTN, HLD, mood disorder and GERD brought to ED after she had a ground-level fall after she lost her balance when she tried to brush away a monster truck toy off the last step on her stair.  She hit her left side of her head and her left hip and had difficulty with mobilization due to severity of her pain.  No prodrome leading to fall.  In ED, stable vitals.  Labs without significant finding.  Left hip/pelvic/knee x-ray without acute finding.  CT head and cervical spine without acute finding.  Left hip CT showed comminuted mildly displaced fracture through the greater trochanter that extends to the lateral aspect of femoral component of left hip arthroplasty.  Orthopedic surgery consulted and recommended nonoperative management with WBAT with no active left hip abduction.  Patient admitted for pain control and therapy evaluation.  Vitamin D  level low at 18.  Anemia panel with iron  deficiency and low vitamin B12.  Started vitamin D .  Received IV Venofer  300 mg and B12 injection 1000 mcg once.  She will continue oral supplementation on discharge.   Therapy recommended SNF.  Subjective: Seen and examined earlier this morning.  No major events overnight or this morning.  Reports some pain all over.  She has rotator cuff issues in addition to fracture she sustained.  Also reports difficulty sleeping.  She says she takes melatonin at home.  Reports good BM yesterday.  Objective: Vitals:   07/04/24 1708 07/04/24 2112 07/05/24 0848 07/05/24 1648  BP: 131/75 (!) 152/69 (!) 150/82 127/77  Pulse: 92 97 98 (!) 103  Resp: 17  18 18 17   Temp: 98.1 F (36.7 C) 98.3 F (36.8 C) 98.1 F (36.7 C) 98 F (36.7 C)  TempSrc:  Oral Oral   SpO2: 96% 100% 97% 96%  Weight:      Height:        Examination:  GENERAL: No apparent distress.  Nontoxic. HEENT: MMM.  Vision and hearing grossly intact.  NECK: Supple.  No apparent JVD.  RESP:  No IWOB.  Fair aeration bilaterally. CVS:  RRR. Heart sounds normal.  ABD/GI/GU: BS+. Abd soft, NTND.  MSK/EXT:  Moves extremities but limited in left leg due to pain. No apparent deformity. No edema.  Tenderness over left hip.  No bruise or hematoma. SKIN: no apparent skin lesion or wound NEURO: AA.  Oriented appropriately.  No apparent focal neuro deficit. PSYCH: Calm. Normal affect.   Consultants:  Orthopedic surgery  Procedures: None  Microbiology summarized: None  Assessment and plan: Ground-level fall-mechanical fall. Left periprosthetic greater trochanteric fracture due to fall and osteoporosis Osteoporosis: Seems to be on Prolia  outpatient Vitamin D  deficiency: Vitamin D  level low at 18. -Ortho, Dr. Reyne recommended WBAT and  no active abduction of the L hip -Pain control.  Scheduled Tylenol  with as needed Robaxin , oxycodone  and Dilaudid . -Vitamin D  50,000 units weekly.  Continue calcium  supplementation.  Continue Prolia  outpatient. -Outpatient follow-up with orthopedic surgery after discharge -Therapy recommended SNF.  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  H&H relatively stable.  May be some element of hemoconcentration  on presentation. Recent Labs    03/05/24 1348 03/06/24 0700 03/06/24 0704 03/06/24 0945 03/06/24 1830 03/07/24 0649 06/30/24 1615 06/30/24 1630 07/02/24 1506 07/03/24 0326  HGB 6.5* 7.3* 8.2* 7.4* 7.6* 8.4* 11.9* 10.9* 8.9* 9.2*  - IV Venofer  300 mg x 1 and B12 injection 1000 mcg x 1  - Continue oral iron  and B12. - Recheck CBC outpatient  Uncontrolled hypertension: Now  normotensive. -Increased oral Cardizem  on 9/19.  Discontinued low-dose amlodipine . -Continue home losartan  -P.o. hydralazine  as needed -Pain control  Constipation: Resolved. - Bowel regimen  Insomnia   Chronic medical problems: CVA hyperlipidemia: continue home Aspirin , pravastatin  NIDDM-2: A1c 7.0% on 8/25.  On metformin , Ozempic  outpatient.  Continue home metformin . CKD-3A: Stable. Moderate persistent asthma: Continue home beclomethasone equivalent, nebs as needed Psoriatic arthritis: On skyrizi outpatient.  GERD: Continue PPI. Anxiety, depression, insomnia: Continue on Cymbalta  and BuSpar .  Increase melatonin to home dose. Tremor?: Continue home primidone  Overactive bladder: Continue home Myrbetriq   Body mass index is 28.62 kg/m.           DVT prophylaxis:  enoxaparin  (LOVENOX ) injection 40 mg Start: 07/01/24 1000  Code Status: Full code Family Communication: None at bedside Level of care: Med-Surg Status is: Observation The patient will require care spanning > 2 midnights and should be moved to inpatient because: Left hip fracture,   Final disposition: SNF.  Medically stable for discharge.   35 minutes with more than 50% spent in reviewing records, counseling patient/family and coordinating care.   Sch Meds:  Scheduled Meds:  acetaminophen   650 mg Oral Q6H WA   aspirin  EC  81 mg Oral Daily   busPIRone   10 mg Oral QHS   clotrimazole    Topical BID   vitamin B-12  1,000 mcg Oral Daily   diltiazem   360 mg Oral Daily   DULoxetine   60 mg Oral Daily   enoxaparin  (LOVENOX ) injection  40 mg Subcutaneous Q24H   ferrous sulfate   325 mg Oral Daily   losartan   100 mg Oral Daily   metFORMIN   500 mg Oral BID WC   mirabegron  ER  25 mg Oral Daily   pantoprazole   40 mg Oral Q0600   pravastatin   80 mg Oral Daily   primidone   125 mg Oral BID   senna-docusate  1 tablet Oral Daily   Vitamin D  (Ergocalciferol )  50,000 Units Oral Q7 days   Continuous Infusions: PRN  Meds:.albuterol , camphor-menthol , hydrALAZINE , HYDROmorphone  (DILAUDID ) injection, loratadine , melatonin, methocarbamol , ondansetron  (ZOFRAN ) IV, oxyCODONE  **OR** oxyCODONE , [COMPLETED] polyethylene glycol **FOLLOWED BY** polyethylene glycol  Antimicrobials: Anti-infectives (From admission, onward)    None        I have personally reviewed the following labs and images: CBC: Recent Labs  Lab 06/30/24 1615 06/30/24 1630 07/02/24 1506 07/03/24 0326  WBC  --  8.6 7.8 7.2  NEUTROABS  --  5.5  --   --   HGB 11.9* 10.9* 8.9* 9.2*  HCT 35.0* 36.2 29.5* 29.9*  MCV  --  77.4* 77.0* 76.3*  PLT  --  333 330 328   BMP &GFR Recent Labs  Lab 06/30/24 1615 07/02/24 1506  NA 139 134*  K 4.4 4.1  CL 105 98  CO2  --  25  GLUCOSE 117* 171*  BUN 20 16  CREATININE 1.00 1.10*  CALCIUM   --  8.9  MG  --  1.8  PHOS  --  3.3   Estimated Creatinine Clearance: 42.8 mL/min (A) (by C-G formula based on SCr of 1.1  mg/dL (H)). Liver & Pancreas: Recent Labs  Lab 07/02/24 1506  ALBUMIN 2.9*   No results for input(s): LIPASE, AMYLASE in the last 168 hours. No results for input(s): AMMONIA in the last 168 hours. Diabetic: No results for input(s): HGBA1C in the last 72 hours. No results for input(s): GLUCAP in the last 168 hours. Cardiac Enzymes: No results for input(s): CKTOTAL, CKMB, CKMBINDEX, TROPONINI in the last 168 hours. No results for input(s): PROBNP in the last 8760 hours. Coagulation Profile: No results for input(s): INR, PROTIME in the last 168 hours. Thyroid  Function Tests: No results for input(s): TSH, T4TOTAL, FREET4, T3FREE, THYROIDAB in the last 72 hours.  Lipid Profile: No results for input(s): CHOL, HDL, LDLCALC, TRIG, CHOLHDL, LDLDIRECT in the last 72 hours. Anemia Panel: No results for input(s): VITAMINB12, FOLATE, FERRITIN, TIBC, IRON , RETICCTPCT in the last 72 hours.  Urine analysis:    Component Value  Date/Time   COLORURINE STRAW (A) 03/07/2024 1239   APPEARANCEUR CLEAR 03/07/2024 1239   LABSPEC 1.008 03/07/2024 1239   PHURINE 6.0 03/07/2024 1239   GLUCOSEU NEGATIVE 03/07/2024 1239   HGBUR NEGATIVE 03/07/2024 1239   BILIRUBINUR NEGATIVE 03/07/2024 1239   KETONESUR NEGATIVE 03/07/2024 1239   PROTEINUR NEGATIVE 03/07/2024 1239   UROBILINOGEN 0.2 06/25/2015 1600   NITRITE NEGATIVE 03/07/2024 1239   LEUKOCYTESUR NEGATIVE 03/07/2024 1239   Sepsis Labs: Invalid input(s): PROCALCITONIN, LACTICIDVEN  Microbiology: No results found for this or any previous visit (from the past 240 hours).  Radiology Studies: No results found.    Mekhai Venuto T. Treysean Petruzzi [facility]  If 7PM-7AM, please contact night-coverage [email or web address] 07/05/2024, 5:04 PM

## 2024-07-05 NOTE — Plan of Care (Signed)
  Problem: Clinical Measurements: Goal: Ability to maintain clinical measurements within normal limits will improve Outcome: Progressing Goal: Will remain free from infection Outcome: Progressing   Problem: Elimination: Goal: Will not experience complications related to bowel motility Outcome: Progressing   Problem: Pain Managment: Goal: General experience of comfort will improve and/or be controlled Outcome: Progressing   Problem: Safety: Goal: Ability to remain free from injury will improve Outcome: Progressing

## 2024-07-05 NOTE — Plan of Care (Signed)

## 2024-07-06 ENCOUNTER — Ambulatory Visit: Admitting: Physical Therapy

## 2024-07-06 DIAGNOSIS — N1831 Chronic kidney disease, stage 3a: Secondary | ICD-10-CM | POA: Diagnosis not present

## 2024-07-06 DIAGNOSIS — K7460 Unspecified cirrhosis of liver: Secondary | ICD-10-CM

## 2024-07-06 DIAGNOSIS — S72102A Unspecified trochanteric fracture of left femur, initial encounter for closed fracture: Secondary | ICD-10-CM | POA: Diagnosis not present

## 2024-07-06 DIAGNOSIS — D649 Anemia, unspecified: Secondary | ICD-10-CM | POA: Diagnosis not present

## 2024-07-06 DIAGNOSIS — I10 Essential (primary) hypertension: Secondary | ICD-10-CM | POA: Diagnosis not present

## 2024-07-06 DIAGNOSIS — E785 Hyperlipidemia, unspecified: Secondary | ICD-10-CM

## 2024-07-06 DIAGNOSIS — S72112A Displaced fracture of greater trochanter of left femur, initial encounter for closed fracture: Secondary | ICD-10-CM | POA: Diagnosis not present

## 2024-07-06 MED ORDER — DILTIAZEM HCL ER COATED BEADS 360 MG PO CP24: 360.0000 mg | ORAL_CAPSULE | Freq: Every day | ORAL | Status: AC

## 2024-07-06 MED ORDER — SENNOSIDES-DOCUSATE SODIUM 8.6-50 MG PO TABS: 2.0000 | ORAL_TABLET | Freq: Two times a day (BID) | ORAL | Status: AC | PRN

## 2024-07-06 NOTE — Discharge Summary (Signed)
 Physician Discharge Summary  Carrie [name] FMW:[id number] DOB: [birth year] DOA: 06/30/2024  PCP: Jolee Madelin Patch, MD  Admit date: 06/30/2024 Discharge date: 07/06/24  Admitted From: Home Disposition: SNF Recommendations for Outpatient Follow-up:  Outpatient follow-up with orthopedic surgery as below Check CMP and CBC in 1 week Please follow up on the following pending results: None   Discharge Condition: Stable CODE STATUS: Full code Diet Orders (From admission, onward)     Start     Ordered   06/30/24 2039  Diet Carb Modified Fluid consistency: Thin; Room service appropriate? Yes  Diet effective now       Question Answer Comment  Diet-HS Snack? Nothing   Calorie Level Medium 1600-2000   Fluid consistency: Thin   Room service appropriate? Yes      06/30/24 2039             Contact information for follow-up providers     Sheril Coy, MD Follow up in 2 week(s).   Specialty: Orthopedic Surgery Contact information: [address] [place] [address] [phone]              Contact information for after-discharge care     Destination     [facility] .   Service: Skilled Nursing Contact information: [address] [place]   [address] [phone]                     Hospital course 68 year old with PMH of CVA, EtOH cirrhosis, mild neurocognitive disorder, DM-2, CKD-3A, osteoporosis, anemia of chronic disease, asthma, HTN, HLD, mood disorder and GERD brought to ED after she had a ground-level fall after she lost her balance when she tried to brush away a monster truck toy off the last step on her stair.  She hit her left side of her head and her left hip and had difficulty with mobilization due to severity of her pain.  No prodrome leading to fall.   In ED, stable vitals.  Labs without significant finding.  Left hip/pelvic/knee x-ray without acute finding.  CT head and cervical spine without acute finding.  Left hip  CT showed comminuted mildly displaced fracture through the greater trochanter that extends to the lateral aspect of femoral component of left hip arthroplasty.  Orthopedic surgery consulted and recommended nonoperative management with WBAT with no active left hip abduction.  Patient admitted for pain control and therapy evaluation.   Vitamin D  level low at 18.  Anemia panel with iron  deficiency and low vitamin B12.  Started vitamin D .  Received IV Venofer  300 mg and B12 injection 1000 mcg once.  She will continue oral supplementation on discharge.   Therapy recommended SNF.  See individual problem list below for more.   Problems addressed during this hospitalization Ground-level fall-mechanical fall. Left periprosthetic greater trochanteric fracture due to fall and osteoporosis Osteoporosis: Seems to be on Prolia  outpatient Vitamin D  deficiency: Vitamin D  level low at 18. -Ortho, Dr. Reyne recommended WBAT and  no active abduction of the L hip -Pain control.  Scheduled Tylenol  with as needed Robaxin , oxycodone  and Dilaudid . -Vitamin D  50,000 units weekly.  Continue calcium  supplementation.  Continue Prolia  outpatient. -Outpatient follow-up with orthopedic surgery after discharge -Therapy recommended SNF.   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  H&H relatively stable.  May be some element of hemoconcentration on presentation. - IV Venofer  300 mg x 1 and B12 injection 1000 mcg x 1  - Continue oral  iron  and B12. - Recheck CBC outpatient   Uncontrolled hypertension: BP slightly elevated -Increased oral Cardizem  on 9/19.  Discontinued low-dose amlodipine . -Continue home losartan  -Pain control   Constipation: Resolved. - Bowel regimen    Chronic medical problems: CVA hyperlipidemia: continue home Aspirin , pravastatin  NIDDM-2: A1c 7.0% on 8/25.  On metformin , Ozempic  outpatient.  Continue home metformin . CKD-3A: Stable. Moderate persistent asthma:  Continue home beclomethasone equivalent, nebs as needed Psoriatic arthritis: On skyrizi outpatient.  GERD: Continue PPI. Anxiety, depression, insomnia: Continue on Cymbalta  and BuSpar .  Increase melatonin to home dose. Tremor?: Continue home primidone  Overactive bladder: Continue home Myrbetriq     Body mass index is 28.62 kg/m.           Consultations: Orthopedic surgery  Time spent 35  minutes  Vital signs Vitals:   07/05/24 1648 07/05/24 2158 07/06/24 0408 07/06/24 0824  BP: 127/77 137/66 (!) 140/60 (!) 145/67  Pulse:  97 79 84  Temp: 98 F (36.7 C) 98 F (36.7 C) 97.7 F (36.5 C) 97.7 F (36.5 C)  Resp: 18 18 16 17   Height:      Weight:      SpO2: 99% 97% 95% 95%  TempSrc:  Oral Oral Oral  BMI (Calculated):         Discharge exam  GENERAL: No apparent distress.  Nontoxic. HEENT: MMM.  Vision and hearing grossly intact.  NECK: Supple.  No apparent JVD.  RESP:  No IWOB.  Fair aeration bilaterally. CVS:  RRR. Heart sounds normal.  ABD/GI/GU: BS+. Abd soft, NTND.  MSK/EXT:  Moves extremities but limited in left leg due to pain. Tenderness over left hip.  No bruise or hematoma. SKIN: no apparent skin lesion or wound NEURO: AA.  Oriented appropriately.  No apparent focal neuro deficit. PSYCH: Calm. Normal affect.   Discharge Instructions Discharge Instructions     Increase activity slowly   Complete by: As directed       Allergies as of 07/06/2024       Reactions   Lisinopril Cough        Medication List     STOP taking these medications    amLODipine  2.5 MG tablet Commonly known as: NORVASC        TAKE these medications    Accu-Chek Guide Me w/Device Kit USE AS INSTRUCTED TO CHECK BLOOD SUGAR 2 TIMES DAILY DX E11.9   Accu-Chek Guide Test test strip Generic drug: glucose blood USE 1 STRIP 2 (TWO) TIMES DAILY.   Accu-Chek Softclix Lancets lancets Use as instructed to check blood sugar 1-2 times daily Dx E11.9   acetaminophen  325 MG  tablet Commonly known as: TYLENOL  Take 2 tablets (650 mg total) by mouth every 6 (six) hours. What changed:  medication strength how much to take when to take this reasons to take this   albuterol  108 (90 Base) MCG/ACT inhaler Commonly known as: VENTOLIN  HFA Inhale 2 puffs into the lungs every 4 (four) hours as needed for wheezing or shortness of breath (or coughing).   aspirin  EC 81 MG tablet Take 1 tablet (81 mg total) by mouth 2 (two) times daily. Swallow whole. What changed: when to take this   busPIRone  5 MG tablet Commonly known as: BUSPAR  Take 10 mg by mouth at bedtime.   CALCIUM  CARBONATE-VITAMIN D3 PO Take 1 tablet by mouth daily.   clotrimazole -betamethasone cream Commonly known as: LOTRISONE Apply 1 Application topically 2 (two) times daily as needed (rash).   denosumab  60 MG/ML Sosy injection Commonly known  as: PROLIA  Inject 60 mg into the skin every 6 (six) months.   diltiazem  360 MG 24 hr capsule Commonly known as: CARDIZEM  CD Take 1 capsule (360 mg total) by mouth daily. Start taking on: July 07, 2024 What changed:  medication strength how much to take   DULoxetine  60 MG capsule Commonly known as: CYMBALTA  Take 60 mg by mouth daily.   ferrous sulfate  325 (65 FE) MG EC tablet Take 325 mg by mouth daily.   FreeStyle Libre 3 Plus Sensor Misc 1 each by Does not apply route every 14 (fourteen) days.   losartan  100 MG tablet Commonly known as: COZAAR  TAKE 1 TABLET BY MOUTH EVERY DAY IN THE MORNING   Melatonin 12 MG Tabs Take 12 mg by mouth at bedtime.   metFORMIN  500 MG 24 hr tablet Commonly known as: GLUCOPHAGE -XR Take 1 tablet (500 mg total) by mouth at bedtime. What changed: when to take this   methocarbamol  500 MG tablet Commonly known as: ROBAXIN  Take 1 tablet (500 mg total) by mouth every 8 (eight) hours as needed for up to 10 days for muscle spasms.   mirabegron  ER 25 MG Tb24 tablet Commonly known as: MYRBETRIQ  Take 25 mg by  mouth daily.   omeprazole 40 MG capsule Commonly known as: PRILOSEC Take 40 mg by mouth 2 (two) times daily.   oxyCODONE  5 MG immediate release tablet Commonly known as: Oxy IR/ROXICODONE  Take 1 tablet (5 mg total) by mouth every 6 (six) hours as needed for severe pain (pain score 7-10).   Ozempic  (1 MG/DOSE) 4 MG/3ML Sopn Generic drug: Semaglutide  (1 MG/DOSE) Inject 1 mg into the skin once a week.   polyethylene glycol powder 17 GM/SCOOP powder Commonly known as: MiraLax  Take 17 g by mouth 2 (two) times daily as needed for moderate constipation.   pravastatin  80 MG tablet Commonly known as: PRAVACHOL  Take 80 mg by mouth daily.   primidone  250 MG tablet Commonly known as: MYSOLINE  Take 250 mg by mouth at bedtime.   Qvar RediHaler 80 MCG/ACT inhaler Generic drug: beclomethasone Inhale 1 puff into the lungs 2 (two) times daily.   senna-docusate 8.6-50 MG tablet Commonly known as: Senokot-S Take 2 tablets by mouth 2 (two) times daily between meals as needed for mild constipation.   Skyrizi Pen 150 MG/ML pen Generic drug: risankizumab-rzaa INJECT 150 MG (1 PEN) Subcutaneous EVERY 12 WEEKS; Duration: 90 days         Procedures/Studies:   CT Hip Left Wo Contrast Result Date: 06/30/2024 CLINICAL DATA:  Hip trauma, fracture suspected, xray done EXAM: CT OF THE LEFT HIP WITHOUT CONTRAST TECHNIQUE: Multidetector CT imaging of the left hip was performed according to the standard protocol. Multiplanar CT image reconstructions were also generated. RADIATION DOSE REDUCTION: This exam was performed according to the departmental dose-optimization program which includes automated exposure control, adjustment of the mA and/or kV according to patient size and/or use of iterative reconstruction technique. COMPARISON:  Radiograph earlier today FINDINGS: Bones/Joint/Cartilage Left hip arthroplasty in place. No arthroplasty dislocation. There is a comminuted mildly displaced fracture through  the greater trochanter that extends to the lateral aspect of the femoral component. Pubic rami are intact. There is degenerative change of the pubic symphysis. No sacroiliac diastasis. Ligaments Suboptimally assessed by CT. Muscles and Tendons No evidence of muscular hematoma. Soft tissues Mild soft tissue edema laterally without confluent hematoma. No acute intrapelvic abnormalities. Pelvic free fluid. IMPRESSION: Comminuted mildly displaced fracture through the greater trochanter that extends to the  lateral aspect of the femoral component of left hip arthroplasty. Electronically Signed   By: Andrea Gasman M.D.   On: 06/30/2024 18:21   CT Head Wo Contrast Result Date: 06/30/2024 CLINICAL DATA:  Tripped and fell. Blunt trauma to head and neck. Pain. EXAM: CT HEAD WITHOUT CONTRAST CT CERVICAL SPINE WITHOUT CONTRAST TECHNIQUE: Multidetector CT imaging of the head and cervical spine was performed following the standard protocol without intravenous contrast. Multiplanar CT image reconstructions of the cervical spine were also generated. RADIATION DOSE REDUCTION: This exam was performed according to the departmental dose-optimization program which includes automated exposure control, adjustment of the mA and/or kV according to patient size and/or use of iterative reconstruction technique. COMPARISON:  Head CT on 08/25/2023, and cervical spine CT on 04/10/2021 FINDINGS: CT HEAD FINDINGS Brain: No evidence of intracranial hemorrhage, acute infarction, hydrocephalus, extra-axial collection, or mass lesion/mass effect. Mild chronic small vessel disease again noted. Vascular:  No hyperdense vessel or other acute findings. Skull: No evidence of fracture or other significant bone abnormality. Sinuses/Orbits:  No acute findings. Other: None. CT CERVICAL SPINE FINDINGS Alignment: Normal. Skull base and vertebrae: No acute fracture. No primary bone lesion or focal pathologic process. Soft tissues and spinal canal: No  prevertebral fluid or swelling. No visible canal hematoma. Disc levels: Atlantoaxial degenerative spurring noted. No disc space narrowing. Upper chest: No acute findings. Other: None. IMPRESSION: No acute intracranial abnormality. Mild chronic small vessel disease. No evidence of acute cervical spine fracture or subluxation. Atlantoaxial degenerative changes noted. Electronically Signed   By: Norleen DELENA Kil M.D.   On: 06/30/2024 15:50   CT Cervical Spine Wo Contrast Result Date: 06/30/2024 CLINICAL DATA:  Tripped and fell. Blunt trauma to head and neck. Pain. EXAM: CT HEAD WITHOUT CONTRAST CT CERVICAL SPINE WITHOUT CONTRAST TECHNIQUE: Multidetector CT imaging of the head and cervical spine was performed following the standard protocol without intravenous contrast. Multiplanar CT image reconstructions of the cervical spine were also generated. RADIATION DOSE REDUCTION: This exam was performed according to the departmental dose-optimization program which includes automated exposure control, adjustment of the mA and/or kV according to patient size and/or use of iterative reconstruction technique. COMPARISON:  Head CT on 08/25/2023, and cervical spine CT on 04/10/2021 FINDINGS: CT HEAD FINDINGS Brain: No evidence of intracranial hemorrhage, acute infarction, hydrocephalus, extra-axial collection, or mass lesion/mass effect. Mild chronic small vessel disease again noted. Vascular:  No hyperdense vessel or other acute findings. Skull: No evidence of fracture or other significant bone abnormality. Sinuses/Orbits:  No acute findings. Other: None. CT CERVICAL SPINE FINDINGS Alignment: Normal. Skull base and vertebrae: No acute fracture. No primary bone lesion or focal pathologic process. Soft tissues and spinal canal: No prevertebral fluid or swelling. No visible canal hematoma. Disc levels: Atlantoaxial degenerative spurring noted. No disc space narrowing. Upper chest: No acute findings. Other: None. IMPRESSION: No acute  intracranial abnormality. Mild chronic small vessel disease. No evidence of acute cervical spine fracture or subluxation. Atlantoaxial degenerative changes noted. Electronically Signed   By: Norleen DELENA Kil M.D.   On: 06/30/2024 15:50   DG Knee Complete 4 Views Left Result Date: 06/30/2024 CLINICAL DATA:  Pain after fall EXAM: LEFT KNEE - COMPLETE 4+ VIEW COMPARISON:  None Available. FINDINGS: Left knee total arthroplasty. No periprosthetic fracture or complications identified. No evidence of dislocation, or joint effusion. Degenerative changes of the patella. Soft tissues are unremarkable. IMPRESSION: Left knee total arthroplasty without evidence of radiographic complication. Electronically Signed   By: Megan  Zare M.D.  On: 06/30/2024 15:26   DG Hip Unilat W or Wo Pelvis 2-3 Views Left Result Date: 06/30/2024 CLINICAL DATA:  Fall.  LEFT hip pain EXAM: DG HIP (WITH OR WITHOUT PELVIS) 2-3V LEFT COMPARISON:  None Available. FINDINGS: LEFT total hip arthroplasty. No evidence of acute fracture or dislocation. IMPRESSION: 1. No evidence of acute fracture or dislocation. 2. LEFT total hip arthroplasty. Electronically Signed   By: Jackquline Boxer M.D.   On: 06/30/2024 15:22       The results of significant diagnostics from this hospitalization (including imaging, microbiology, ancillary and laboratory) are listed below for reference.     Microbiology: No results found for this or any previous visit (from the past 240 hours).   Labs:  CBC: Recent Labs  Lab 06/30/24 1615 06/30/24 1630 07/02/24 1506 07/03/24 0326  WBC  --  8.6 7.8 7.2  NEUTROABS  --  5.5  --   --   HGB 11.9* 10.9* 8.9* 9.2*  HCT 35.0* 36.2 29.5* 29.9*  MCV  --  77.4* 77.0* 76.3*  PLT  --  333 330 328   BMP &GFR Recent Labs  Lab 06/30/24 1615 07/02/24 1506  NA 139 134*  K 4.4 4.1  CL 105 98  CO2  --  25  GLUCOSE 117* 171*  BUN 20 16  CREATININE 1.00 1.10*  CALCIUM   --  8.9  MG  --  1.8  PHOS  --  3.3    Estimated Creatinine Clearance: 42.8 mL/min (A) (by C-G formula based on SCr of 1.1 mg/dL (H)). Liver & Pancreas: Recent Labs  Lab 07/02/24 1506  ALBUMIN 2.9*   No results for input(s): LIPASE, AMYLASE in the last 168 hours. No results for input(s): AMMONIA in the last 168 hours. Diabetic: No results for input(s): HGBA1C in the last 72 hours. No results for input(s): GLUCAP in the last 168 hours. Cardiac Enzymes: No results for input(s): CKTOTAL, CKMB, CKMBINDEX, TROPONINI in the last 168 hours. No results for input(s): PROBNP in the last 8760 hours. Coagulation Profile: No results for input(s): INR, PROTIME in the last 168 hours. Thyroid  Function Tests: No results for input(s): TSH, T4TOTAL, FREET4, T3FREE, THYROIDAB in the last 72 hours. Lipid Profile: No results for input(s): CHOL, HDL, LDLCALC, TRIG, CHOLHDL, LDLDIRECT in the last 72 hours. Anemia Panel: No results for input(s): VITAMINB12, FOLATE, FERRITIN, TIBC, IRON , RETICCTPCT in the last 72 hours. Urine analysis:    Component Value Date/Time   COLORURINE STRAW (A) 03/07/2024 1239   APPEARANCEUR CLEAR 03/07/2024 1239   LABSPEC 1.008 03/07/2024 1239   PHURINE 6.0 03/07/2024 1239   GLUCOSEU NEGATIVE 03/07/2024 1239   HGBUR NEGATIVE 03/07/2024 1239   BILIRUBINUR NEGATIVE 03/07/2024 1239   KETONESUR NEGATIVE 03/07/2024 1239   PROTEINUR NEGATIVE 03/07/2024 1239   UROBILINOGEN 0.2 06/25/2015 1600   NITRITE NEGATIVE 03/07/2024 1239   LEUKOCYTESUR NEGATIVE 03/07/2024 1239   Sepsis Labs: Invalid input(s): PROCALCITONIN, LACTICIDVEN   SIGNED:  Vannie Hochstetler T Preethi Scantlebury, MD  [facility] 07/06/2024, 11:01 AM

## 2024-07-06 NOTE — [employer] Notes (Signed)
 Patient discharging today and going to [facility], report given to [employer].

## 2024-07-06 NOTE — Plan of Care (Signed)
  Problem: Education: Goal: Knowledge of General Education information will improve Description: Including pain rating scale, medication(s)/side effects and non-pharmacologic comfort measures Outcome: Progressing   Problem: Health Behavior/Discharge Planning: Goal: Ability to manage health-related needs will improve Outcome: Progressing   Problem: Clinical Measurements: Goal: Ability to maintain clinical measurements within normal limits will improve Outcome: Progressing   Problem: Activity: Goal: Risk for activity intolerance will decrease Outcome: Progressing   Problem: Coping: Goal: Level of anxiety will decrease Outcome: Progressing   Problem: Elimination: Goal: Will not experience complications related to bowel motility Outcome: Progressing   

## 2024-07-06 NOTE — TOC Progression Note (Addendum)
 Transition of Care [facility]) - Progression Note    Patient Details  Name: Carrie [name] MRN: [id number] Date of Birth: [birth year]  [facility] CM/SW Contact  Inocente GORMAN Kindle, LCSW Phone Number: 07/06/2024, 8:45 AM  Clinical Narrative:    8:45 AM-CSW reached out to [facility] to confirm they can still accept patient today. Awaiting confirmation.   8:59 AM-[facility] confirmed they can accept patient today. Await update from MD.   12:11 PM-CSW arranging [facility] for transport. CSW updated patient and patient's son.  Expected Discharge Plan: Skilled Nursing Facility Barriers to Discharge: Barriers resolved               Expected Discharge Plan and Services       Living arrangements for the past 2 months: Single Family Home                                       Social Drivers of Health (SDOH) Interventions SDOH Screenings   Food Insecurity: No Food Insecurity (06/30/2024)  Housing: Low Risk  (06/30/2024)  Transportation Needs: No Transportation Needs (06/30/2024)  Utilities: Not At Risk (06/30/2024)  Depression (PHQ2-9): Low Risk  (04/03/2024)  Social Connections: Unknown (06/30/2024)  Tobacco Use: Low Risk  (07/01/2024)    Readmission Risk Interventions    03/06/2024    2:48 PM  Readmission Risk Prevention Plan  Transportation Screening Complete  PCP or Specialist Appt within 5-7 Days Complete  Home Care Screening Complete  Medication Review (RN CM) Complete

## 2024-07-06 NOTE — TOC Transition Note (Signed)
 Transition of Care [facility]) - Discharge Note   Patient Details  Name: Carrie [name] MRN: [id number] Date of Birth: 02/15/1956  [facility]) CM/SW Contact:  Inocente GORMAN Kindle, LCSW Phone Number: 07/06/2024, 12:58 PM   Clinical Narrative:    Patient will DC to: [facility] Anticipated DC date: 07/06/24 Family notified: Son, Donnajean Transport by: [facility]   Per MD patient ready for DC to [facility]. RN to call report prior to discharge [phone] room 105). RN, patient, patient's family, and facility notified of DC. Discharge Summary and FL2 sent to facility. DC packet on chart including signed scripts. Ambulance transport requested for patient.   CSW will sign off for now as social work intervention is no longer needed. Please consult us  again if new needs arise.     Final next level of care: Skilled Nursing Facility Barriers to Discharge: Barriers Resolved   Patient Goals and CMS Choice Patient states their goals for this hospitalization and ongoing recovery are:: SNF CMS Medicare.gov Compare Post Acute Care list provided to:: Patient Choice offered to / list presented to : Patient [place] ownership interest in [facility].provided to:: Patient    Discharge Placement   Existing PASRR number confirmed : 07/06/24          Patient [facility] bed at: [facility] Patient to be transferred to facility by: [facility] Name of family member notified: Son Patient and family notified of of transfer: 07/06/24  Discharge Plan and Services Additional resources added to the After Visit Summary for                                       Social Drivers of Health (SDOH) Interventions SDOH Screenings   Food Insecurity: No Food Insecurity (06/30/2024)  Housing: Low Risk  (06/30/2024)  Transportation Needs: No Transportation Needs (06/30/2024)  Utilities: Not At Risk (06/30/2024)  Depression (PHQ2-9): Low Risk  (04/03/2024)  Social Connections: Unknown (06/30/2024)   Tobacco Use: Low Risk  (07/01/2024)     Readmission Risk Interventions    03/06/2024    2:48 PM  Readmission Risk Prevention Plan  Transportation Screening Complete  PCP or Specialist Appt within 5-7 Days Complete  Home Care Screening Complete  Medication Review (RN CM) Complete

## 2024-07-08 ENCOUNTER — Ambulatory Visit: Admitting: Physical Therapy

## 2024-07-13 ENCOUNTER — Ambulatory Visit: Admitting: Physical Therapy

## 2024-07-15 ENCOUNTER — Ambulatory Visit: Admitting: Physical Therapy

## 2024-08-11 ENCOUNTER — Encounter: Payer: Self-pay | Admitting: Internal Medicine

## 2024-08-11 ENCOUNTER — Ambulatory Visit (INDEPENDENT_AMBULATORY_CARE_PROVIDER_SITE_OTHER): Admitting: Internal Medicine

## 2024-08-11 VITALS — BP 124/60 | HR 78 | Ht 60.0 in | Wt 147.6 lb

## 2024-08-11 DIAGNOSIS — Z794 Long term (current) use of insulin: Secondary | ICD-10-CM

## 2024-08-11 DIAGNOSIS — Z23 Encounter for immunization: Secondary | ICD-10-CM

## 2024-08-11 DIAGNOSIS — E65 Localized adiposity: Secondary | ICD-10-CM | POA: Diagnosis not present

## 2024-08-11 DIAGNOSIS — E785 Hyperlipidemia, unspecified: Secondary | ICD-10-CM

## 2024-08-11 DIAGNOSIS — E1165 Type 2 diabetes mellitus with hyperglycemia: Secondary | ICD-10-CM

## 2024-08-11 MED ORDER — METFORMIN HCL ER 500 MG PO TB24: 500.0000 mg | ORAL_TABLET | Freq: Two times a day (BID) | ORAL | 3 refills | Status: DC

## 2024-08-11 NOTE — Progress Notes (Signed)
 Patient ID: Carrie [name], female   DOB: July 11, 1956, 69 y.o.   MRN: [id number]  HPI: Carrie [name] is a 68 y.o.-year-old female, returning for follow-up for DM2, dx in 2010, non-insulin -dependent, controlled, with complications (TIA, DR, CKD, peripheral neuropathy, h/o gastroparesis). Pt. previously saw Dr. Kassie, last visit 2 months ago.  Interim history: No increased urination, blurry vision, nausea, chest pain.   She fell and fractured humerus before last visit >> had to have a shoulder replacement >> in PT. she had another fall 06/30/2024 and fractured her L femur (trochanteric closed fracture).  She was discharged to rehab. Now home for 2 weeks.  She stopped sweat tea before last visit.  Reviewed HbA1c: Lab Results  Component Value Date   HGBA1C 7.0 (A) 06/08/2024   HGBA1C 8.2 (A) 02/20/2024   HGBA1C 7.9 (A) 10/23/2023   HGBA1C 6.4 (A) 06/21/2023   HGBA1C 6.8 (A) 02/14/2023   HGBA1C 6.3 (H) 12/14/2022   HGBA1C 6.0 (H) 06/25/2022   HGBA1C 5.8 (A) 01/16/2022   HGBA1C 6.1 (A) 09/21/2021   HGBA1C 6.0 (A) 03/16/2021   Pt is on a regimen of: - Metformin  500 mg daily >> 2x a day - Ozempic  1 mg weekly >>  Trulicity  3 mg weekly - changed by rheumatology 2/2 signif. Weight loss (lowest weight 118 lbs) >> restarted Ozempic  0.5 >> 1 mg weekly - ran out >> restarted She had vaginitis from [employer]. She was on multiple insulin  injections 2011-2021 but had problems forgetting them - per Dr. Laymond note.  Pt was checking  her sugars 2x a day: - am:  130-170 >> 100-120, but on Pred.: 167-200 >> 140-150 (gummies at night) - 2h after b'fast: n/c - before lunch: n/c - 2h after lunch: n/c >> 200s >> n/c - before dinner: n/c - 2h after dinner: 112-117 >> n/c >> 200s >> 200-300 >> 170-207 - bedtime: n/c >> 110-112 - nighttime: n/c Lowest sugar was Lo x1 >> ... 130 >> 100 >> 110 ; she has hypoglycemia awareness at 60s.  Highest sugar was  200s > 300 (eating out now - refrigerator is broken)  >> 207  Glucometer: Accu-Chek  - + CKD - seeing nephrology, last BUN/creatinine:  Lab Results  Component Value Date   BUN 16 07/02/2024   BUN 20 06/30/2024   CREATININE 1.10 (H) 07/02/2024   CREATININE 1.00 06/30/2024   Lab Results  Component Value Date   MICRALBCREAT 23 10/01/2023   On Cozaar  100 mg daily.  -+ HL; last set of lipids:  Lab Results  Component Value Date   CHOL 139 06/25/2022   HDL 36 (L) 06/25/2022   LDLCALC 73 06/25/2022   TRIG 150 (H) 06/25/2022   CHOLHDL 3.9 06/25/2022  On pravastatin  80 mg daily.  - last eye exam was in 2024. 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 She sees a retinal specialist - on IO injections.   - + numbness and tingling in her feet. She sees neurology >> on Gabapentin  - still symptomatic >> tapered it off >> Cymbalta  >> helps.  Last foot exam 06/08/2024.  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 She had L total hip replacement on 10/26/2022 after a femoral fracture.  She has osteoporosis and is on Prolia . She also had right shoulder arthroscopy 12/18/2022. She fell and fractured humerus 02/2024 >> had to have a shoulder replacement..  She was in  the emergency room with acute cystitis with hematuria 05/15/2023. She is on Prednisone  for joint swelling for her psoriatic arthritis. She fell and had another trochanteric left femoral fracture 06/30/2024.  She did not have to have surgery.  She continues Prolia .  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   Liver cirrhosis (HCC)    Nausea and vomiting 01/06/2013   Neuropathy    Bil feet re: to Diabetes   Obesity    Osteoporosis    Plantar fasciitis    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   Stricture and stenosis of esophagus    Stroke (HCC)    TIA (transient ischemic attack)    8-10 yrs ago, no problems since   Tuberculosis    tested positive 2011, no symptoms, was on medicine for 6 months   Past Surgical History:  Procedure Laterality Date   BREAST SURGERY Bilateral    biopsies both breast   CARDIAC CATHETERIZATION  08/30/2010   CESAREAN SECTION     x 2    CHOLECYSTECTOMY     COLONOSCOPY     FOOT SURGERY Left    spur removal   HERNIA REPAIR     umbilical hernia repaired at the same time as cholecystectomy   IR FLUORO GUIDE CV LINE RIGHT  04/12/2021   IR US  GUIDE VASC ACCESS RIGHT  04/12/2021   JOINT REPLACEMENT     bilat knee    KNEE SURGERY Bilateral    x 2 joint knee replacements   KYPHOPLASTY N/A 03/28/2022   Procedure: LUMBAR ONE KYPHOPLASTY;  Surgeon: Beuford Anes, MD;  Location: [facility];  Service: Orthopedics;  Laterality: N/A;  LUMBAR ONE KYPHOPLASTY   REVERSE SHOULDER ARTHROPLASTY Left 03/06/2024   Procedure: ARTHROPLASTY, SHOULDER, TOTAL, REVERSE;  Surgeon: Dozier Soulier, MD;  Location: [facility];  Service: Orthopedics;  Laterality: Left;   SHOULDER ARTHROSCOPY Right 12/18/2022   Procedure: RIGHT SHOULDER ARTHROSCOPY, ACROMIOPLASTY AND DEBRIDEMENT;  Surgeon: Sheril Coy, MD;  Location: [facility];  Service: Orthopedics;  Laterality: Right;   TOTAL HIP ARTHROPLASTY Left 10/26/2022   Procedure: TOTAL HIP ARTHROPLASTY ANTERIOR APPROACH;  Surgeon: Yvone Rush, MD;  Location: [facility];  Service: Orthopedics;  Laterality: Left;   TOTAL KNEE REVISION Left 10/03/2021   Procedure: LEFT TOTAL KNEE REVISION POLY SWAP WITH IRRIGATION AND DEBRIDEMENT;  Surgeon: Sheril Coy, MD;  Location:  [facility];  Service: Orthopedics;  Laterality: Left;   UPPER GASTROINTESTINAL ENDOSCOPY     WISDOM TOOTH EXTRACTION     Social History   Socioeconomic History   Marital status: Divorced    Spouse name: Not on file   Number of children: 2   Years of education: Not on file   Highest education level: Not on file  Occupational History   Occupation: Disabled   Tobacco Use   Smoking status: Never    Passive exposure: Never   Smokeless tobacco:  Never  Vaping Use   Vaping status: Never Used  Substance and Sexual Activity   Alcohol use: Not Currently   Drug use: No   Sexual activity: Not Currently    Birth control/protection: Post-menopausal  Other Topics Concern   Not on file  Social History Narrative   Sodas daily    Social Drivers of Health   Financial Resource Strain: Not on file  Food Insecurity: No Food Insecurity (06/30/2024)   Hunger Vital Sign    Worried About Running Out of Food in the Last Year: Never true    Ran Out of Food in the Last Year: Never true  Transportation Needs: No Transportation Needs (06/30/2024)   PRAPARE - Administrator, Civil Service (Medical): No    Lack of Transportation (Non-Medical): No  Physical Activity: Not on file  Stress: Not on file  Social Connections: Unknown (06/30/2024)   Social Connection and Isolation Panel    Frequency of Communication with Friends and Family: More than three times a week    Frequency of Social Gatherings with Friends and Family: More than three times a week    Attends Religious Services: Patient unable to answer    Active Member of Clubs or Organizations: Patient unable to answer    Attends Banker Meetings: Patient unable to answer    Marital Status: Patient unable to answer  Intimate Partner Violence: Not At Risk (06/30/2024)   Humiliation, Afraid, Rape, and Kick questionnaire    Fear of Current or Ex-Partner: No    Emotionally Abused: No    Physically Abused: No    Sexually Abused: No    Current Outpatient Medications on File Prior to Visit  Medication Sig Dispense Refill   Accu-Chek Softclix Lancets lancets Use as instructed to check blood sugar 1-2 times daily Dx E11.9 200 each 3   acetaminophen  (TYLENOL ) 325 MG tablet Take 2 tablets (650 mg total) by mouth every 6 (six) hours.     albuterol  (VENTOLIN  HFA) 108 (90 Base) MCG/ACT inhaler Inhale 2 puffs into the lungs every 4 (four) hours as needed for wheezing or shortness of breath (or coughing).     aspirin  EC 81 MG tablet Take 1 tablet (81 mg total) by mouth 2 (two) times daily. Swallow whole. (Patient taking differently: Take 81 mg by mouth daily. Swallow whole.)     Blood Glucose Monitoring Suppl (ACCU-CHEK GUIDE ME) w/Device KIT USE AS INSTRUCTED TO CHECK BLOOD SUGAR 2 TIMES DAILY DX E11.9 1 kit 0   busPIRone  (BUSPAR ) 5 MG tablet Take 10 mg by mouth at bedtime.     Calcium  Carb-Cholecalciferol (CALCIUM  CARBONATE-VITAMIN D3 PO) Take 1 tablet by mouth daily.     clotrimazole -betamethasone (LOTRISONE) cream Apply 1 Application topically 2 (two) times daily as needed (rash).     Continuous Glucose Sensor (FREESTYLE LIBRE 3 PLUS SENSOR) MISC 1 each by Does not apply route every 14 (fourteen) days. 6 each 3   denosumab  (PROLIA ) 60 MG/ML SOSY injection Inject 60 mg into the skin every 6 (six) months.     diltiazem  (CARDIZEM  CD) 360 MG 24 hr capsule Take 1 capsule (360 mg total) by mouth daily.     DULoxetine  (CYMBALTA ) 60 MG capsule Take 60 mg by mouth daily.     ferrous sulfate  325 (65 FE) MG EC tablet Take 325 mg by mouth daily.     glucose blood (ACCU-CHEK GUIDE TEST) test strip USE 1 STRIP 2 (TWO) TIMES DAILY. 200 strip 5  losartan  (COZAAR ) 100 MG tablet TAKE 1 TABLET BY MOUTH EVERY DAY IN THE MORNING 90 tablet 1   Melatonin 12 MG TABS Take 12 mg by mouth at bedtime.     metFORMIN  (GLUCOPHAGE -XR) 500 MG 24 hr tablet Take 1 tablet (500 mg total) by mouth at bedtime. (Patient taking differently: Take 500 mg by mouth 2 (two)  times daily.) 90 tablet 3   mirabegron  ER (MYRBETRIQ ) 25 MG TB24 tablet Take 25 mg by mouth daily.     omeprazole (PRILOSEC) 40 MG capsule Take 40 mg by mouth 2 (two) times daily.     oxyCODONE  (OXY IR/ROXICODONE ) 5 MG immediate release tablet Take 1 tablet (5 mg total) by mouth every 6 (six) hours as needed for severe pain (pain score 7-10). 30 tablet 0   polyethylene glycol powder (MIRALAX ) 17 GM/SCOOP powder Take 17 g by mouth 2 (two) times daily as needed for moderate constipation.     pravastatin  (PRAVACHOL ) 80 MG tablet Take 80 mg by mouth daily.     primidone  (MYSOLINE ) 250 MG tablet Take 250 mg by mouth at bedtime.     QVAR REDIHALER 80 MCG/ACT inhaler Inhale 1 puff into the lungs 2 (two) times daily.     Semaglutide , 1 MG/DOSE, (OZEMPIC , 1 MG/DOSE,) 4 MG/3ML SOPN Inject 1 mg into the skin once a week. 9 mL 3   senna-docusate (SENOKOT-S) 8.6-50 MG tablet Take 2 tablets by mouth 2 (two) times daily between meals as needed for mild constipation.     SKYRIZI PEN 150 MG/ML pen INJECT 150 MG (1 PEN) Subcutaneous EVERY 12 WEEKS; Duration: 90 days     Current Facility-Administered Medications on File Prior to Visit  Medication Dose Route Frequency Provider Last Rate Last Admin   denosumab  (PROLIA ) injection 60 mg  60 mg Subcutaneous Once Ishmael Slough, MD       Allergies  Allergen Reactions   Lisinopril Cough   Family History  Problem Relation Age of Onset   Diabetes Other        maternal aunts and uncles   Diabetes Father    Liver disease Father    Emphysema Father        smoked   Diabetes Sister    Diabetes Brother    Sarcoidosis Brother    Emphysema Mother        smoked   Asthma Mother    Asthma Brother    Colon cancer Neg Hx    Esophageal cancer Neg Hx    Stomach cancer Neg Hx    Rectal cancer Neg Hx    PE: BP 124/60   Pulse 78   Ht 5' (1.524 m)   Wt 147 lb 9.6 oz (67 kg)   LMP  (LMP Unknown)   SpO2 96%   BMI 28.83 kg/m  Wt Readings from Last 3 Encounters:   08/11/24 147 lb 9.6 oz (67 kg)  06/30/24 146 lb 8.3 oz (66.5 kg)  06/08/24 146 lb 12.8 oz (66.6 kg)   Constitutional: overweight, in NAD, walking slowly with a walker Eyes:  EOMI, no exophthalmos ENT: no neck masses, no cervical lymphadenopathy Cardiovascular: RRR, No MRG Respiratory: CTA bilaterally Musculoskeletal: no deformities Skin:no rashes Neurological: + tremor with outstretched hands  ASSESSMENT: 1. DM2, non-insulin -dependent, controlled, with complications - DR - H/o TIA - CKD-sees nephrology - PN - H/o Gastroparesis - resolved  2. HL  3.  Central obesity  PLAN:  1. Patient with longstanding, uncontrolled, type 2 diabetes, on metformin  and weekly  GLP-1 receptor agonist, with higher blood sugars at last visit while on prednisone  but overall improved HbA1c since the previous visit: 7.0%.  Sugars were high especially after dinner but she was not checking consistently.  I recommended a CGM but unfortunately this was not covered for her.  I did advise her that if she would not be able to start the sensor, to check sugars more frequently - At today's visit, sugars appear to be improved, with most of the values at goal.  HbA1c is also better.  I suspect that this is due to stopping steroids but also stopping sweet drinks.  Will continue the same regimen for now.  Metformin  was refilled for her today. - I suggested to:  Patient Instructions  Please continue: - Metformin  ER 500 mg 2x a day - Ozempic  1 mg weekly    Please return in 4 months.  - we checked her HbA1c: 6.5% (improved) - advised to check sugars at different times of the day - 1-2x a day, rotating check times - advised for yearly eye exams >> she is UTD - return to clinic in 4 months  2. HL - Latest lipid panel was reviewed from 09/2023: LDL above our target of less than 55, triglycerides elevated, HDL low:  - Atorvastatin  dose was increased to 80 mg daily in 10/2023.  No side effects.  3.  Central  obesity -She lost a significant amount of weight on Ozempic  before, down to 118 pounds.  We did discuss about switching from Ozempic  to Trulicity  but she gained 29 pounds back after doing so.  Her BMI is in the overweight range usually, but she has central obesity so we started back on Ozempic .  At last visit, I advised her to increase the dose to 1 mg weekly. - She lost 4 pounds before last visit  + Flu shot today  Lela Fendt, MD PhD [facility] Endocrinology

## 2024-08-11 NOTE — Patient Instructions (Addendum)
 Please continue: - Metformin  ER 500 mg 2x a day - Ozempic  1 mg weekly    Please return in 4 months.

## 2024-10-06 ENCOUNTER — Ambulatory Visit

## 2024-10-06 VITALS — BP 148/85 | HR 83 | Temp 97.9°F | Resp 18 | Ht 59.0 in | Wt 143.0 lb

## 2024-10-06 DIAGNOSIS — M810 Age-related osteoporosis without current pathological fracture: Secondary | ICD-10-CM | POA: Diagnosis not present

## 2024-10-06 MED ORDER — DENOSUMAB 60 MG/ML ~~LOC~~ SOSY
60.0000 mg | PREFILLED_SYRINGE | Freq: Once | SUBCUTANEOUS | Status: AC
  Administered 2024-10-06: 60 mg via SUBCUTANEOUS

## 2024-10-06 NOTE — Progress Notes (Signed)
 Diagnosis:  Osteoporosis  Provider:  Mannam, Praveen MD  Procedure: Injection  Prolia  (Denosumab ), Dose: 60 mg, Site: subcutaneous, Number of injections: 1  Injection Site(s): Left arm  Post Care: Patient declined observation  Discharge: Condition: Good, Destination: Home . AVS Provided  Performed by:  Leland Raver G Pilkington-Burchett, RN

## 2024-11-01 ENCOUNTER — Other Ambulatory Visit: Payer: Self-pay | Admitting: "Endocrinology

## 2024-11-01 DIAGNOSIS — E1165 Type 2 diabetes mellitus with hyperglycemia: Secondary | ICD-10-CM

## 2024-12-14 ENCOUNTER — Ambulatory Visit: Admitting: Internal Medicine

## 2025-04-13 ENCOUNTER — Ambulatory Visit
